# Patient Record
Sex: Female | Born: 1955 | Race: White | Hispanic: No | Marital: Married | Smoking: Former smoker
Health system: Southern US, Community
[De-identification: ages and names within clinical notes are randomized; demographics above are authoritative.]

## PROBLEM LIST (undated history)

## (undated) DIAGNOSIS — F32A Depression, unspecified: Secondary | ICD-10-CM

## (undated) DIAGNOSIS — E039 Hypothyroidism, unspecified: Secondary | ICD-10-CM

## (undated) DIAGNOSIS — R011 Cardiac murmur, unspecified: Secondary | ICD-10-CM

## (undated) DIAGNOSIS — I2510 Atherosclerotic heart disease of native coronary artery without angina pectoris: Secondary | ICD-10-CM

## (undated) DIAGNOSIS — R7303 Prediabetes: Secondary | ICD-10-CM

## (undated) DIAGNOSIS — E785 Hyperlipidemia, unspecified: Secondary | ICD-10-CM

## (undated) DIAGNOSIS — T7840XA Allergy, unspecified, initial encounter: Secondary | ICD-10-CM

## (undated) DIAGNOSIS — F329 Major depressive disorder, single episode, unspecified: Secondary | ICD-10-CM

## (undated) DIAGNOSIS — Z8701 Personal history of pneumonia (recurrent): Secondary | ICD-10-CM

## (undated) DIAGNOSIS — K589 Irritable bowel syndrome without diarrhea: Secondary | ICD-10-CM

## (undated) DIAGNOSIS — G43909 Migraine, unspecified, not intractable, without status migrainosus: Secondary | ICD-10-CM

## (undated) DIAGNOSIS — E119 Type 2 diabetes mellitus without complications: Secondary | ICD-10-CM

## (undated) DIAGNOSIS — R0902 Hypoxemia: Secondary | ICD-10-CM

## (undated) DIAGNOSIS — Z973 Presence of spectacles and contact lenses: Secondary | ICD-10-CM

## (undated) DIAGNOSIS — G4730 Sleep apnea, unspecified: Secondary | ICD-10-CM

## (undated) DIAGNOSIS — M1990 Unspecified osteoarthritis, unspecified site: Secondary | ICD-10-CM

## (undated) DIAGNOSIS — I514 Myocarditis, unspecified: Secondary | ICD-10-CM

## (undated) DIAGNOSIS — Z87442 Personal history of urinary calculi: Secondary | ICD-10-CM

## (undated) DIAGNOSIS — Z5189 Encounter for other specified aftercare: Secondary | ICD-10-CM

## (undated) DIAGNOSIS — F411 Generalized anxiety disorder: Secondary | ICD-10-CM

## (undated) DIAGNOSIS — R0600 Dyspnea, unspecified: Secondary | ICD-10-CM

## (undated) HISTORY — PX: CYST EXCISION: SHX5701

## (undated) HISTORY — DX: Type 2 diabetes mellitus without complications: E11.9

## (undated) HISTORY — DX: Encounter for other specified aftercare: Z51.89

## (undated) HISTORY — PX: ABDOMINAL HYSTERECTOMY: SHX81

## (undated) HISTORY — DX: Hyperlipidemia, unspecified: E78.5

## (undated) HISTORY — DX: Hypothyroidism, unspecified: E03.9

## (undated) HISTORY — DX: Depression, unspecified: F32.A

## (undated) HISTORY — DX: Hypoxemia: R09.02

## (undated) HISTORY — DX: Major depressive disorder, single episode, unspecified: F32.9

## (undated) HISTORY — DX: Migraine, unspecified, not intractable, without status migrainosus: G43.909

## (undated) HISTORY — DX: Sleep apnea, unspecified: G47.30

## (undated) HISTORY — PX: POLYPECTOMY: SHX149

## (undated) HISTORY — DX: Myocarditis, unspecified: I51.4

## (undated) HISTORY — DX: Irritable bowel syndrome without diarrhea: K58.9

## (undated) HISTORY — DX: Unspecified osteoarthritis, unspecified site: M19.90

## (undated) HISTORY — DX: Allergy, unspecified, initial encounter: T78.40XA

## (undated) HISTORY — DX: Generalized anxiety disorder: F41.1

## (undated) HISTORY — DX: Prediabetes: R73.03

## (undated) HISTORY — DX: Personal history of pneumonia (recurrent): Z87.01

## (undated) HISTORY — DX: Cardiac murmur, unspecified: R01.1

---
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
## 1986-06-19 HISTORY — PX: BREAST SURGERY: SHX581

## 1986-06-19 HISTORY — PX: BREAST EXCISIONAL BIOPSY: SUR124

## 1998-06-19 HISTORY — PX: TOTAL ABDOMINAL HYSTERECTOMY W/ BILATERAL SALPINGOOPHORECTOMY: SHX83

## 2000-06-19 HISTORY — PX: CHOLECYSTECTOMY: SHX55

## 2008-09-08 ENCOUNTER — Ambulatory Visit: Payer: Self-pay | Admitting: Family Medicine

## 2008-09-08 DIAGNOSIS — E119 Type 2 diabetes mellitus without complications: Secondary | ICD-10-CM | POA: Insufficient documentation

## 2008-09-08 DIAGNOSIS — R197 Diarrhea, unspecified: Secondary | ICD-10-CM | POA: Insufficient documentation

## 2008-09-08 DIAGNOSIS — F3342 Major depressive disorder, recurrent, in full remission: Secondary | ICD-10-CM | POA: Insufficient documentation

## 2008-09-08 DIAGNOSIS — E039 Hypothyroidism, unspecified: Secondary | ICD-10-CM | POA: Insufficient documentation

## 2008-09-08 DIAGNOSIS — R519 Headache, unspecified: Secondary | ICD-10-CM | POA: Insufficient documentation

## 2008-09-08 DIAGNOSIS — R51 Headache: Secondary | ICD-10-CM | POA: Insufficient documentation

## 2008-09-08 DIAGNOSIS — J45909 Unspecified asthma, uncomplicated: Secondary | ICD-10-CM | POA: Insufficient documentation

## 2008-09-08 HISTORY — DX: Major depressive disorder, recurrent, in full remission: F33.42

## 2008-09-09 ENCOUNTER — Ambulatory Visit: Payer: Self-pay | Admitting: Family Medicine

## 2008-09-09 DIAGNOSIS — R5381 Other malaise: Secondary | ICD-10-CM | POA: Insufficient documentation

## 2008-09-09 DIAGNOSIS — R5383 Other fatigue: Secondary | ICD-10-CM

## 2008-09-13 LAB — CONVERTED CEMR LAB
ALT: 31 units/L (ref 0–35)
AST: 25 units/L (ref 0–37)
BUN: 14 mg/dL (ref 6–23)
Bilirubin, Direct: 0 mg/dL (ref 0.0–0.3)
CO2: 28 meq/L (ref 19–32)
Chloride: 111 meq/L (ref 96–112)
Cholesterol: 210 mg/dL — ABNORMAL HIGH (ref 0–200)
Creatinine, Ser: 0.7 mg/dL (ref 0.4–1.2)
Eosinophils Absolute: 0.1 10*3/uL (ref 0.0–0.7)
Free T4: 0.8 ng/dL (ref 0.6–1.6)
Lymphocytes Relative: 35.1 % (ref 12.0–46.0)
MCHC: 34.2 g/dL (ref 30.0–36.0)
MCV: 81.1 fL (ref 78.0–100.0)
Monocytes Absolute: 0.4 10*3/uL (ref 0.1–1.0)
Neutrophils Relative %: 56.1 % (ref 43.0–77.0)
Platelets: 227 10*3/uL (ref 150.0–400.0)
Potassium: 4 meq/L (ref 3.5–5.1)
RBC: 5.02 M/uL (ref 3.87–5.11)
T4, Total: 6.7 ug/dL (ref 5.0–12.5)
TSH: 2 microintl units/mL (ref 0.35–5.50)
Total Bilirubin: 0.6 mg/dL (ref 0.3–1.2)
Total Protein: 7 g/dL (ref 6.0–8.3)
VLDL: 90.6 mg/dL — ABNORMAL HIGH (ref 0.0–40.0)
WBC: 6.4 10*3/uL (ref 4.5–10.5)

## 2008-12-04 ENCOUNTER — Encounter (INDEPENDENT_AMBULATORY_CARE_PROVIDER_SITE_OTHER): Payer: Self-pay | Admitting: *Deleted

## 2009-04-23 ENCOUNTER — Telehealth: Payer: Self-pay | Admitting: Family Medicine

## 2009-05-24 ENCOUNTER — Ambulatory Visit: Payer: Self-pay | Admitting: Family Medicine

## 2009-07-21 ENCOUNTER — Telehealth: Payer: Self-pay | Admitting: Family Medicine

## 2009-07-21 DIAGNOSIS — E781 Pure hyperglyceridemia: Secondary | ICD-10-CM | POA: Insufficient documentation

## 2009-09-27 ENCOUNTER — Telehealth: Payer: Self-pay | Admitting: Family Medicine

## 2009-11-08 ENCOUNTER — Telehealth: Payer: Self-pay | Admitting: Family Medicine

## 2009-11-17 ENCOUNTER — Ambulatory Visit: Payer: Self-pay | Admitting: Family Medicine

## 2009-11-17 DIAGNOSIS — J029 Acute pharyngitis, unspecified: Secondary | ICD-10-CM | POA: Insufficient documentation

## 2009-11-18 ENCOUNTER — Telehealth: Payer: Self-pay | Admitting: Family Medicine

## 2009-11-18 ENCOUNTER — Encounter: Payer: Self-pay | Admitting: Family Medicine

## 2009-11-19 ENCOUNTER — Ambulatory Visit: Payer: Self-pay | Admitting: Family Medicine

## 2009-11-19 DIAGNOSIS — H6690 Otitis media, unspecified, unspecified ear: Secondary | ICD-10-CM | POA: Insufficient documentation

## 2009-12-09 ENCOUNTER — Encounter (INDEPENDENT_AMBULATORY_CARE_PROVIDER_SITE_OTHER): Payer: Self-pay | Admitting: *Deleted

## 2010-01-14 ENCOUNTER — Ambulatory Visit: Payer: Self-pay | Admitting: Family Medicine

## 2010-01-14 DIAGNOSIS — L03319 Cellulitis of trunk, unspecified: Secondary | ICD-10-CM

## 2010-01-14 DIAGNOSIS — L02219 Cutaneous abscess of trunk, unspecified: Secondary | ICD-10-CM | POA: Insufficient documentation

## 2010-01-15 ENCOUNTER — Encounter: Payer: Self-pay | Admitting: Family Medicine

## 2010-01-17 ENCOUNTER — Ambulatory Visit: Payer: Self-pay | Admitting: Cardiovascular Disease

## 2010-01-17 ENCOUNTER — Inpatient Hospital Stay: Payer: Self-pay | Admitting: Internal Medicine

## 2010-01-17 HISTORY — PX: CARDIAC CATHETERIZATION: SHX172

## 2010-01-17 IMAGING — CR DG CHEST 2V
1 series · 2 of 2 positions shown · non-contrast
Comparison: none

REASON FOR EXAM: fever
COMMENTS:

[Series 1: view not recorded · 0.17mm/px · 2 of 2 slices shown]
[im 1/2]
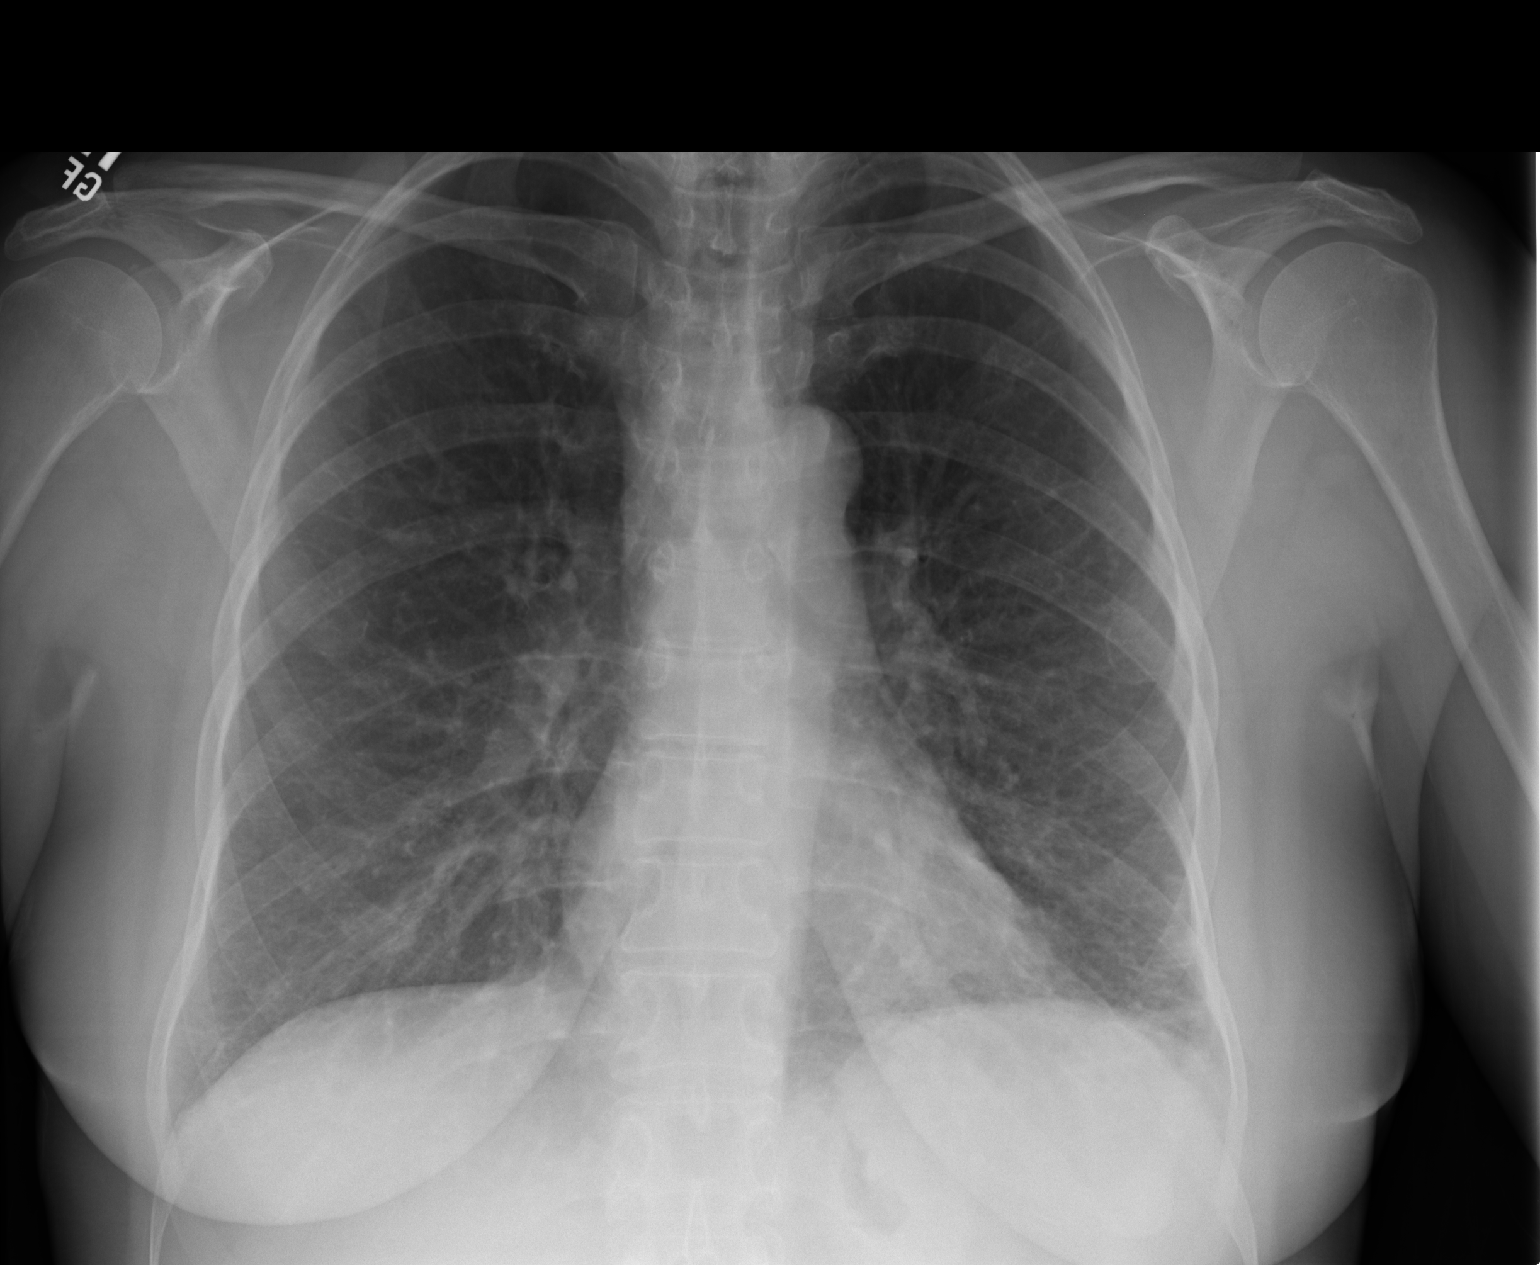
[im 2/2]
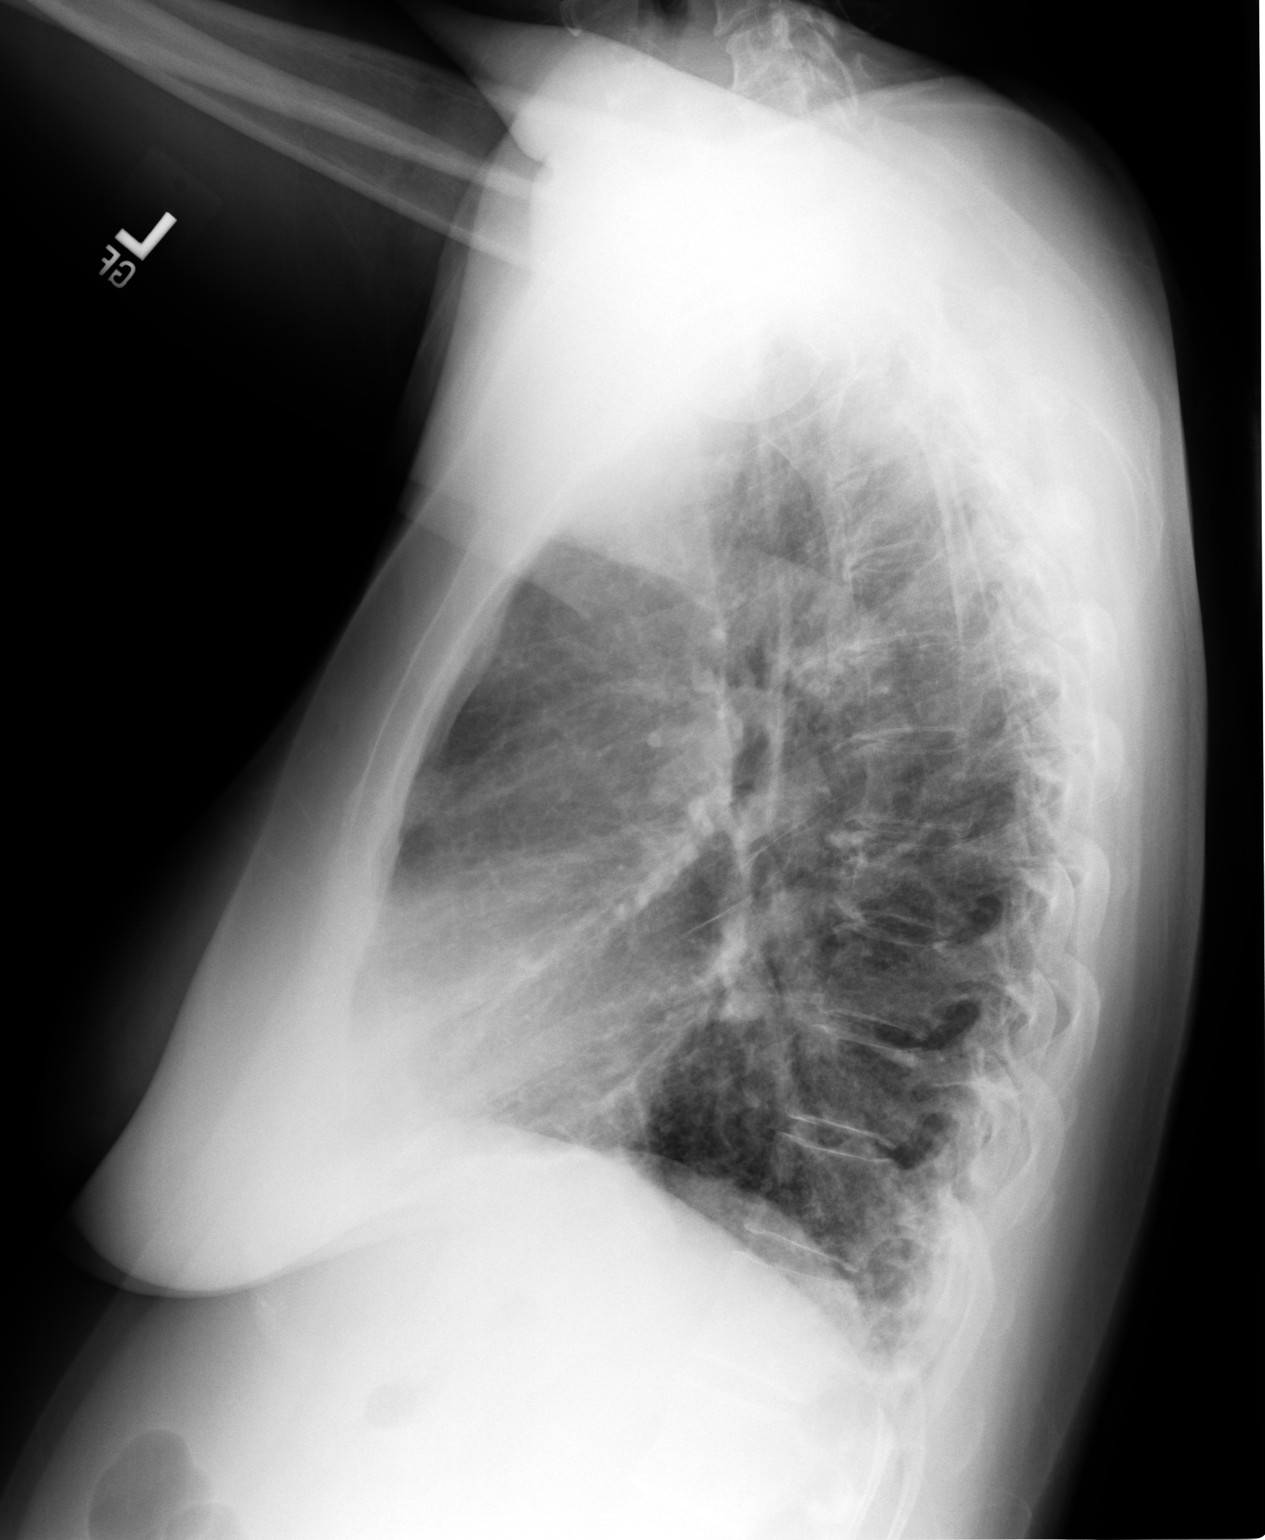

[2 of 2 positions shown; findings below may reference images not displayed]

PROCEDURE:     DXR - DXR CHEST PA (OR AP) AND LATERAL  - [DATE]  [DATE]

RESULT:     There is minimal discoid atelectasis at the left costophrenic
angle. The lung fields otherwise are clear. No definite pneumonia is seen.
No pneumothorax or pleural effusion is noted. Heart size is normal. The
mediastinal and osseous structures show no significant abnormalities.
IMPRESSION: 1.     There is minimal atelectasis at the left base. The lung fields
otherwise are clear.

## 2010-01-18 ENCOUNTER — Telehealth: Payer: Self-pay | Admitting: Family Medicine

## 2010-01-19 ENCOUNTER — Encounter: Payer: Self-pay | Admitting: Cardiovascular Disease

## 2010-01-19 IMAGING — CR DG CHEST 1V PORT
1 series · 1 of 1 positions shown · non-contrast
Comparison: none

REASON FOR EXAM: shortness of breath
COMMENTS:

PROCEDURE:     DXR - DXR PORTABLE CHEST SINGLE VIEW  - [DATE]  [DATE]
RESULT:     Comparison is made to the prior exam of [DATE]. The lung
fields are clear. The heart, mediastinal and osseous structures show no
significant abnormalities. Monitoring electrodes are present.

[view not recorded]
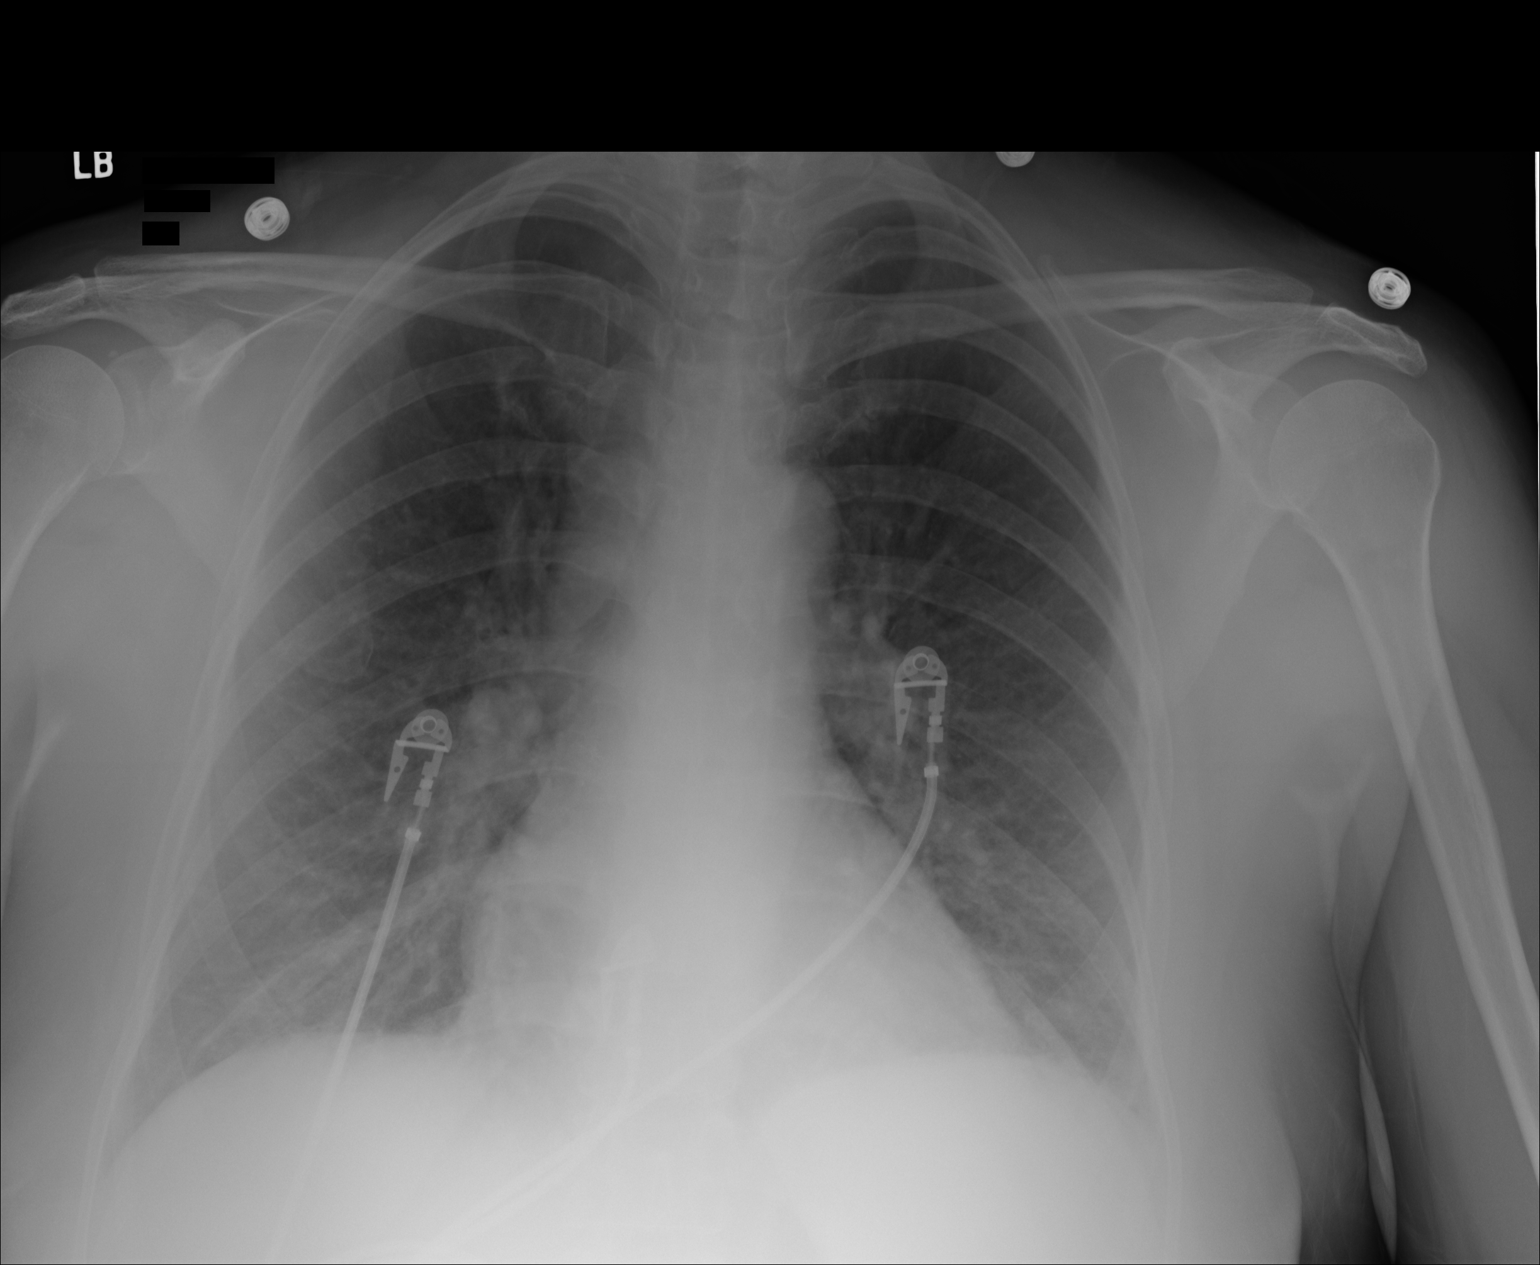

[1 of 1 positions shown; findings below may reference images not displayed]

IMPRESSION: 1.     No acute changes are identified.

## 2010-01-20 ENCOUNTER — Encounter: Payer: Self-pay | Admitting: Cardiovascular Disease

## 2010-01-21 ENCOUNTER — Encounter: Payer: Self-pay | Admitting: Cardiovascular Disease

## 2010-01-21 DIAGNOSIS — I514 Myocarditis, unspecified: Secondary | ICD-10-CM

## 2010-01-21 HISTORY — DX: Myocarditis, unspecified: I51.4

## 2010-01-27 ENCOUNTER — Ambulatory Visit: Payer: Self-pay | Admitting: Family Medicine

## 2010-01-27 DIAGNOSIS — B3322 Viral myocarditis: Secondary | ICD-10-CM | POA: Insufficient documentation

## 2010-02-08 ENCOUNTER — Ambulatory Visit: Payer: Self-pay | Admitting: Cardiovascular Disease

## 2010-02-08 DIAGNOSIS — R0602 Shortness of breath: Secondary | ICD-10-CM | POA: Insufficient documentation

## 2010-03-18 ENCOUNTER — Observation Stay (HOSPITAL_COMMUNITY)
Admission: EM | Admit: 2010-03-18 | Discharge: 2010-03-20 | Payer: Self-pay | Source: Home / Self Care | Admitting: Emergency Medicine

## 2010-03-18 ENCOUNTER — Encounter: Payer: Self-pay | Admitting: Cardiovascular Disease

## 2010-03-18 ENCOUNTER — Ambulatory Visit: Payer: Self-pay | Admitting: Cardiovascular Disease

## 2010-03-18 ENCOUNTER — Ambulatory Visit: Payer: Self-pay | Admitting: Internal Medicine

## 2010-03-18 IMAGING — CR DG CHEST 1V PORT
1 series · 1 of 1 positions shown · non-contrast
Comparison: None

CLINICAL DATA: Chest pain

PORTABLE CHEST - 1 VIEW

[view not recorded]
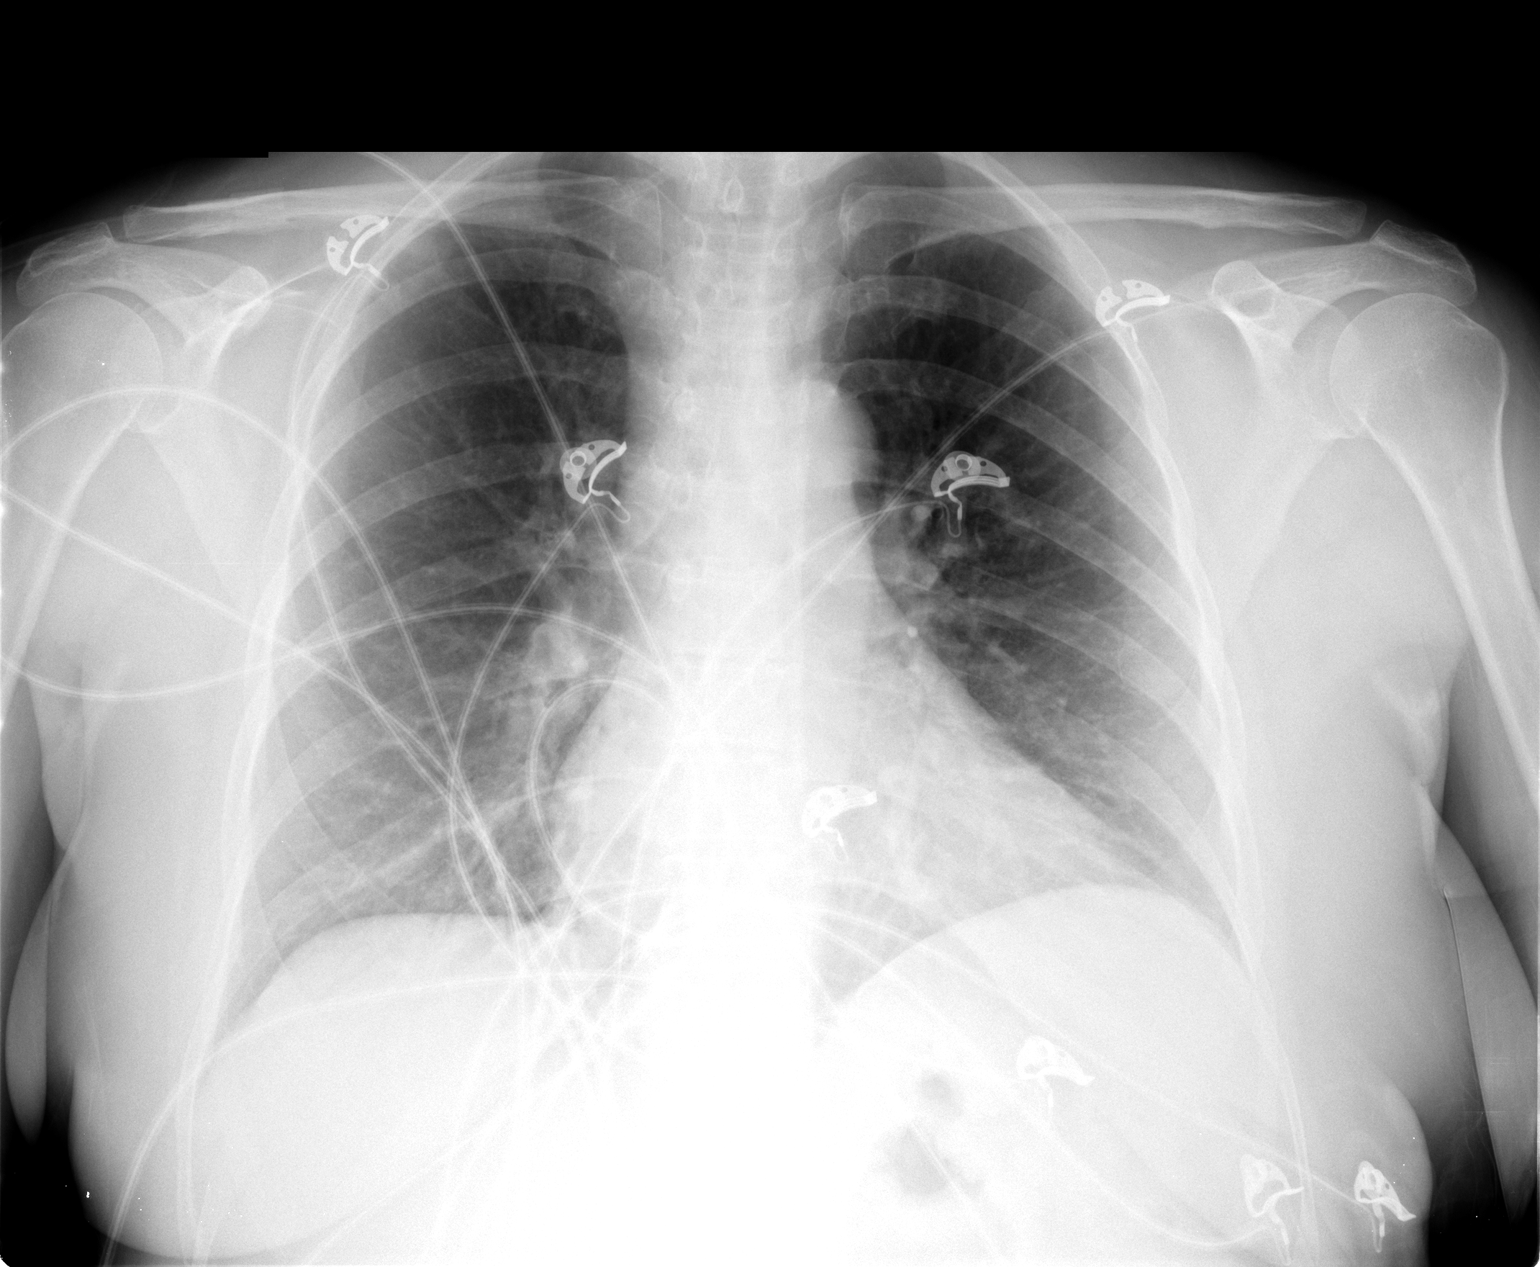

[1 of 1 positions shown; findings below may reference images not displayed]

FINDINGS: Heart and mediastinal contours are within normal limits.
No focal opacities or effusions.  No acute bony abnormality.
IMPRESSION: No active disease.

## 2010-03-19 ENCOUNTER — Encounter: Payer: Self-pay | Admitting: Cardiology

## 2010-03-24 ENCOUNTER — Telehealth (INDEPENDENT_AMBULATORY_CARE_PROVIDER_SITE_OTHER): Payer: Self-pay | Admitting: *Deleted

## 2010-04-01 ENCOUNTER — Ambulatory Visit: Payer: Self-pay | Admitting: Cardiovascular Disease

## 2010-04-01 DIAGNOSIS — E782 Mixed hyperlipidemia: Secondary | ICD-10-CM | POA: Insufficient documentation

## 2010-04-05 ENCOUNTER — Telehealth (INDEPENDENT_AMBULATORY_CARE_PROVIDER_SITE_OTHER): Payer: Self-pay | Admitting: *Deleted

## 2010-05-11 ENCOUNTER — Ambulatory Visit: Payer: Self-pay | Admitting: Family Medicine

## 2010-06-01 ENCOUNTER — Ambulatory Visit: Payer: Self-pay | Admitting: Family Medicine

## 2010-06-01 ENCOUNTER — Encounter (INDEPENDENT_AMBULATORY_CARE_PROVIDER_SITE_OTHER): Payer: Self-pay | Admitting: *Deleted

## 2010-06-01 DIAGNOSIS — R209 Unspecified disturbances of skin sensation: Secondary | ICD-10-CM | POA: Insufficient documentation

## 2010-06-07 ENCOUNTER — Telehealth: Payer: Self-pay | Admitting: Family Medicine

## 2010-06-08 ENCOUNTER — Ambulatory Visit: Payer: Self-pay | Admitting: Family Medicine

## 2010-06-08 DIAGNOSIS — K5289 Other specified noninfective gastroenteritis and colitis: Secondary | ICD-10-CM | POA: Insufficient documentation

## 2010-07-19 NOTE — Cardiovascular Report (Signed)
Summary: Cardiac Cath Other  Cardiac Cath Other   Imported By: [place] 01/20/2010 14:40:00  _____________________________________________________________________  External Attachment:    Type:   Image     Comment:   External Document

## 2010-07-19 NOTE — Progress Notes (Signed)
Summary: Topiramate  Phone Note Refill Request Message from:  Fax from Pharmacy on Nov 08, 2009 11:21 AM  Refills Requested: Medication #1:  TOPAMAX 50 MG TABS take one two times a day [facility]  Phone:   [phone]   Method Requested: Electronic Initial call taken by: Delilah Shan CMA (AAMA),  Nov 08, 2009 11:22 AM    Prescriptions: TOPAMAX 50 MG TABS (TOPIRAMATE) take one two times a day  #60 x 11   Entered and Authorized by:   Hannah Beat MD   Signed by:   Hannah Beat MD on 11/08/2009   Method used:   Electron[facility] to        [id number]       [address]       [place], [place]  [address]       Ph: [phone]       Fax: [phone]   RxID:   [id number]

## 2010-07-19 NOTE — Letter (Signed)
[facility]   Imported By: Harlon Flor 01/25/2010 09:15:49  _____________________________________________________________________  External Attachment:    Type:   Image     Comment:   External Document

## 2010-07-19 NOTE — Cardiovascular Report (Signed)
[facility]   Imported By: Harlon Flor 02/09/2010 16:25:06  _____________________________________________________________________  External Attachment:    Type:   Image     Comment:   External Document

## 2010-07-19 NOTE — Progress Notes (Signed)
Summary: CALLED PT  Phone Note Outgoing Call Call back at Home Phone [phone]   Call placed by: Harlon Flor,  April 05, 2010 9:09 AM Call placed to: Patient Summary of Call: [facility] to schedule fasting labs Initial call taken by: Harlon Flor,  April 05, 2010 9:09 AM

## 2010-07-19 NOTE — Progress Notes (Signed)
  Pt signed ROI,Mailed records out to day to pt [facility]  March 24, 2010 9:14 AM

## 2010-07-19 NOTE — Assessment & Plan Note (Signed)
Summary: NURSE/AMD  Nurse Visit   Vital Signs:  Patient profile:   55 year old female Height:      64 inches Pulse rate:   74 / minute BP supine:   132 / 78  (left arm) Cuff size:   regular Comments Pt arrived at office c/o chest pain. Pt has been having chest pain on and office since last night it was relieved by nitro but pt has not take nitro today. Pt describes chest pain as 4/10 radiating into her left arm and jaw. Spoke to Dr. Gala Romney who advised that the pt be transported to ER, pt chose to go to [facility]. Will be transported via [employer].  Pt left via EMS Benedict Needy, RN  March 18, 2010 12:44 PM    Allergies: 1)  ! * Nyquil 2)  ! Penicillin

## 2010-07-19 NOTE — Miscellaneous (Signed)
Summary: med list update- pravachol  Clinical Lists Changes  Medications: Changed medication from PRAVACHOL 10 MG TABS (PRAVASTATIN SODIUM) take one by mouth daily to PRAVACHOL 40 MG TABS (PRAVASTATIN SODIUM) take one by mouth daily     Prior Medications: LOVAZA 1 GM CAPS (OMEGA-3-ACID ETHYL ESTERS) take two caps two times a day TOPAMAX 50 MG TABS (TOPIRAMATE) take one two times a day ZOLOFT 50 MG TABS (SERTRALINE HCL) take one by mouth daily PRAVACHOL 40 MG TABS (PRAVASTATIN SODIUM) take one by mouth daily LEVOTHYROXINE SODIUM 50 MCG TABS (LEVOTHYROXINE SODIUM) take one by mouth daily METFORMIN HCL 500 MG TABS (METFORMIN HCL) take one by mouth daily ACCU-CHEK AVIVA  STRP (GLUCOSE BLOOD) Use as directed. TRICOR 145 MG TABS (FENOFIBRATE) take one by mouth daily CHERATUSSIN AC 100-10 MG/5ML SYRP (GUAIFENESIN-CODEINE) 5 ml by mouth at bedtime as needed cough Current Allergies: ! * NYQUIL ! PENICILLIN

## 2010-07-19 NOTE — Assessment & Plan Note (Signed)
Summary: sore throat, fever, laryngitis 3:30   Vital Signs:  Patient profile:   55 year old female Height:      64 inches Weight:      159.38 pounds BMI:     27.46 Temp:     98.8 degrees F oral Pulse rate:   64 / minute Pulse rhythm:   regular BP sitting:   122 / 80  (left arm) Cuff size:   regular  Vitals Entered By: Linde Gillis CMA Duncan Dull) (November 17, 2009 3:31 PM) CC: fever, cough, sore throat   History of Present Illness: 55 yo here for 2 day of subjective fever, cough, sore throat. cough is mainly dry. No n/v/d. No headache. No rash. Ears are popping. Cough keeping her up at night.  No known sick contacts but works around a lot of people at [employer].  Current Medications (verified): 1)  Lovaza 1 Gm Caps (Omega-3-Acid Ethyl Esters) .... Take Two Caps Two Times A Day 2)  Topamax 50 Mg Tabs (Topiramate) .... Take One Two Times A Day 3)  Zoloft 50 Mg Tabs (Sertraline Hcl) .... Take One By Mouth Daily 4)  Pravachol 10 Mg Tabs (Pravastatin Sodium) .... Take One By Mouth Daily 5)  Levothyroxine Sodium 50 Mcg Tabs (Levothyroxine Sodium) .... Take One By Mouth Daily 6)  Metformin Hcl 500 Mg Tabs (Metformin Hcl) .... Take One By Mouth Daily 7)  Accu-Chek Aviva  Strp (Glucose Blood) .... Use As Directed. 8)  Tricor 145 Mg Tabs (Fenofibrate) .... Take One By Mouth Daily 9)  Cheratussin Ac 100-10 Mg/71ml Syrp (Guaifenesin-Codeine) .... 5 Ml By Mouth At Bedtime As Needed Cough  Allergies: 1)  ! * Nyquil 2)  ! Penicillin  Review of Systems      See HPI General:  Complains of chills and fever. ENT:  Complains of nasal congestion, postnasal drainage, sinus pressure, and sore throat; denies ear discharge. CV:  Denies chest pain or discomfort. Resp:  Complains of cough; denies shortness of breath, sputum productive, and wheezing. GI:  Denies abdominal pain, nausea, and vomiting.  Physical Exam  General:  Well-developed,well-nourished,in no acute distress; alert,appropriate and  cooperative throughout examination Ears:  TMs retracted Nose:  mucosal erythema.   Mouth:  pharyngeal erythema.   no exudates Lungs:  Normal respiratory effort, chest expands symmetrically. Lungs are clear to auscultation, no crackles or wheezes. Heart:  Normal rate and regular rhythm. S1 and S2 normal without gallop, murmur, click, rub or other extra sounds. Extremities:  No clubbing, cyanosis, edema, or deformity noted with normal full range of motion of all joints.   Psych:  Cognition and judgment appear intact. Alert and cooperative with normal attention span and concentration. No apparent delusions, illusions, hallucinations   Impression & Recommendations:  Problem # 1:  URI (ICD-465.9) Assessment New Likely viral, encouraged supportive care. Rapid strep negative and history and physical exam not consistent with strep pharyngitis. See pt instructions for details. Her updated medication list for this problem includes:    Cheratussin Ac 100-10 Mg/58ml Syrp (Guaifenesin-codeine) .Marland KitchenMarland KitchenMarland KitchenMarland Kitchen 5 ml by mouth at bedtime as needed cough  Complete Medication List: 1)  Lovaza 1 Gm Caps (Omega-3-acid ethyl esters) .... Take two caps two times a day 2)  Topamax 50 Mg Tabs (Topiramate) .... Take one two times a day 3)  Zoloft 50 Mg Tabs (Sertraline hcl) .... Take one by mouth daily 4)  Pravachol 10 Mg Tabs (Pravastatin sodium) .... Take one by mouth daily 5)  Levothyroxine Sodium 50  Mcg Tabs (Levothyroxine sodium) .... Take one by mouth daily 6)  Metformin Hcl 500 Mg Tabs (Metformin hcl) .... Take one by mouth daily 7)  Accu-chek Aviva Strp (Glucose blood) .... Use as directed. 8)  Tricor 145 Mg Tabs (Fenofibrate) .... Take one by mouth daily 9)  Cheratussin Ac 100-10 Mg/65ml Syrp (Guaifenesin-codeine) .... 5 ml by mouth at bedtime as needed cough  Other Orders: Rapid Strep ([address])  Patient Instructions: 1)  Get plenty of rest, drink lots of clear liquids, and use Tylenol or Ibuprofen for fever  and comfort. Return in 7-10 days if you're not better: sooner if you'er feeling worse.  Prescriptions: CHERATUSSIN AC 100-10 MG/5ML SYRP (GUAIFENESIN-CODEINE) 5 ml by mouth at bedtime as needed cough  #4 ounces x 0   Entered and Authorized by:   Ruthe Mannan MD   Signed by:   Ruthe Mannan MD on 11/17/2009   Method used:   Print then Give to Patient   RxID:   [id number]   Current Allergies (reviewed today): ! * NYQUIL ! PENICILLIN

## 2010-07-19 NOTE — Progress Notes (Signed)
Summary: Prior Authorization for Lovaza  Phone Note Outgoing Call Call back at [phone]   Call placed by: Linde Gillis CMA Duncan Dull),  July 21, 2009 9:53 AM Call placed to: [employer] Summary of Call: Received [facility] [phone] stating that prior authorization is needed for Lovaza.  [employer] and spoke to [place] D.  She is faxing paper work to our office today. Initial call taken by: Linde Gillis CMA Duncan Dull),  July 21, 2009 9:54 AM  Follow-up for Phone Call        Form filled out by Dr. Patsy Lager, faxed form to [facility] [phone].  Follow-up by: Linde Gillis CMA Duncan Dull),  July 26, 2009 4:09 PM  Additional Follow-up for Phone Call Additional follow up Details #1::        Received approval for Lovaza.  Approved from 07/05/2009 through 07/26/2010, patient and pharmacy notified. Additional Follow-up by: Linde Gillis CMA Duncan Dull),  August 05, 2009 11:21 AM  New Problems: HYPERTRIGLYCERIDEMIA (ICD-272.1)   New Problems: HYPERTRIGLYCERIDEMIA (ICD-272.1)

## 2010-07-19 NOTE — Letter (Signed)
Summary: Out of Work  [employer] at [facility]  [address] [place], [place] [address]   Phone: [phone]  Fax: [phone]    November 18, 2009   Employee:  PRISHA HILEY    To Whom It May Concern:   For Medical reasons, please excuse the above named employee from work for the following dates:  Start:   11/18/2009  End:   11/22/2009  If you need additional information, please feel free to contact our office.         Sincerely,    Ruthe Mannan MD

## 2010-07-19 NOTE — Letter (Signed)
Summary: PHI  PHI   Imported By: Harlon Flor 02/09/2010 16:25:58  _____________________________________________________________________  External Attachment:    Type:   Image     Comment:   External Document

## 2010-07-19 NOTE — Letter (Signed)
Summary: Work Writer at [employer]. [address]   [place], [place] [address]   Phone: [phone]  Fax: [phone]     March 18, 2010    Brandi Mahoney   The above named patient had a medical visit today at:    1  am  and transported to [facility] Emergency Room.   Please take this into consideration when reviewing the time away from work/school.      Sincerely yours,  Architectural technologist

## 2010-07-19 NOTE — Assessment & Plan Note (Signed)
Summary: 3:15 not feeling any better ear pain both ears/rbh   Vital Signs:  Patient profile:   55 year old female Height:      64 inches Weight:      157 pounds BMI:     27.05 Temp:     102.4 degrees F oral Pulse rate:   88 / minute Pulse rhythm:   regular BP sitting:   130 / 84  (left arm) Cuff size:   regular  Vitals Entered By: Linde Gillis CMA Duncan Dull) (November 19, 2009 3:25 PM) CC: not feeling any better, ear pain   History of Present Illness: 55 yo here for worsening left ear pain.  Was seen 2 days ago with subjective fever, cough, sore throat. Now has a fever of 102.4, no cough or other symptoms but left ear is excruciatingly painful. No drainage.  No known sick contacts but works around a lot of people at [employer].  Current Medications (verified): 1)  Lovaza 1 Gm Caps (Omega-3-Acid Ethyl Esters) .... Take Two Caps Two Times A Day 2)  Topamax 50 Mg Tabs (Topiramate) .... Take One Two Times A Day 3)  Zoloft 50 Mg Tabs (Sertraline Hcl) .... Take One By Mouth Daily 4)  Pravachol 10 Mg Tabs (Pravastatin Sodium) .... Take One By Mouth Daily 5)  Levothyroxine Sodium 50 Mcg Tabs (Levothyroxine Sodium) .... Take One By Mouth Daily 6)  Metformin Hcl 500 Mg Tabs (Metformin Hcl) .... Take One By Mouth Daily 7)  Accu-Chek Aviva  Strp (Glucose Blood) .... Use As Directed. 8)  Tricor 145 Mg Tabs (Fenofibrate) .... Take One By Mouth Daily 9)  Cheratussin Ac 100-10 Mg/37ml Syrp (Guaifenesin-Codeine) .... 5 Ml By Mouth At Bedtime As Needed Cough 10)  Azithromycin 250 Mg  Tabs (Azithromycin) .... 2 By  Mouth Today and Then 1 Daily For 4 Days  Allergies: 1)  ! * Nyquil 2)  ! Penicillin  Review of Systems      See HPI General:  Complains of fever. ENT:  Complains of earache and postnasal drainage; denies ear discharge.  Physical Exam  General:  Well-developed,well-nourished,in no acute distress; alert,appropriate and cooperative throughout examination Ears:  No left TM is buldging,  erythematous canal Right TM normal Mouth:  pharyngeal erythema.   no exudates Lungs:  Normal respiratory effort, chest expands symmetrically. Lungs are clear to auscultation, no crackles or wheezes. Heart:  Normal rate and regular rhythm. S1 and S2 normal without gallop, murmur, click, rub or other extra sounds. Extremities:  No clubbing, cyanosis, edema, or deformity noted with normal full range of motion of all joints.     Impression & Recommendations:  Problem # 1:  OTITIS MEDIA, ACUTE, LEFT (ICD-382.9) Assessment New Allergic to PCN. Treat with Zpack, supportive care with Ibuprofen and Cheratussin. Her updated medication list for this problem includes:    Azithromycin 250 Mg Tabs (Azithromycin) .Marland Kitchen... 2 by  mouth today and then 1 daily for 4 days  Complete Medication List: 1)  Lovaza 1 Gm Caps (Omega-3-acid ethyl esters) .... Take two caps two times a day 2)  Topamax 50 Mg Tabs (Topiramate) .... Take one two times a day 3)  Zoloft 50 Mg Tabs (Sertraline hcl) .... Take one by mouth daily 4)  Pravachol 10 Mg Tabs (Pravastatin sodium) .... Take one by mouth daily 5)  Levothyroxine Sodium 50 Mcg Tabs (Levothyroxine sodium) .... Take one by mouth daily 6)  Metformin Hcl 500 Mg Tabs (Metformin hcl) .... Take one by mouth daily  7)  Accu-chek Aviva Strp (Glucose blood) .... Use as directed. 8)  Tricor 145 Mg Tabs (Fenofibrate) .... Take one by mouth daily 9)  Cheratussin Ac 100-10 Mg/15ml Syrp (Guaifenesin-codeine) .... 5 ml by mouth at bedtime as needed cough 10)  Azithromycin 250 Mg Tabs (Azithromycin) .... 2 by  mouth today and then 1 daily for 4 days Prescriptions: AZITHROMYCIN 250 MG  TABS (AZITHROMYCIN) 2 by  mouth today and then 1 daily for 4 days  #6 x 0   Entered and Authorized by:   Ruthe Mannan MD   Signed by:   Ruthe Mannan MD on 11/19/2009   Method used:   [facility]        [id number]       [address]       [place], [place]  [address]        Ph: [phone]       Fax: [phone]   RxID:   [id number]   Current Allergies (reviewed today): ! * NYQUIL ! PENICILLIN

## 2010-07-19 NOTE — Progress Notes (Signed)
Summary: call a nurse   Phone Note Other Incoming   Caller: Fax from call a nurse - Albertine Grates Summary of Call: Husband/Tony called concerned that patient had cyst drained on 01-13-10 in the office and has since developed a fever of 104. She has vomited z 1. Mouth is very dry and she feels light headed. he was advised to go to ED.  Initial call taken by: Melody Comas,  January 18, 2010 9:15 AM  Follow-up for Phone Call        Agree.  Please call to find out how pt sding today and ER outcome..? admitted for IV antibiotics. Follow-up by: Kerby Nora MD,  January 18, 2010 9:22 AM  Additional Follow-up for Phone Call Additional follow up Details #1::        called patient on home and cell number got know answer will try again later.Consuello Masse CMA  Pt admitted to [facility], fever has broken, doctors at hospital dont think this is related to the cyst that she had drained.   She has had 3 bags of fluids but still feels very weak.  Husband will keep you updated.               Lowella Petties CMA  January 18, 2010 11:55 AM

## 2010-07-19 NOTE — Letter (Signed)
Summary: Out of Work  [employer] at [facility]  [address] [place], [place] [address]   Phone: [phone]  Fax: [phone]    November 17, 2009   Employee:  Brandi Mahoney    To Whom It May Concern:   For Medical reasons, please excuse the above named employee from work for the following dates:  Start:   November 18, 2009  End:   November 19, 2009  If you need additional information, please feel free to contact our office.         Sincerely,    Ruthe Mannan MD

## 2010-07-19 NOTE — Progress Notes (Signed)
Summary: refill request for tricor  Phone Note Refill Request Message from:  Fax from Pharmacy  Refills Requested: Medication #1:  tricor 145 mg   Last Refilled: 08/23/2009 This is not on pt's med list.  Initial call taken by: Lowella Petties CMA,  September 27, 2009 3:48 PM  Follow-up for Phone Call        ok to refill  Tricor 145 mg, 1 by mouth daily Call in to the pharmacy of their choice Call in #30, 11 refills. OR if they prefer a 90 day supply, #90 with 3 refills is OK, too Prescription instructions above   place on med list Follow-up by: Hannah Beat MD,  September 27, 2009 4:35 PM  Additional Follow-up for Phone Call Additional follow up Details #1::        Sent electronically to [facility], added to [employer]. Additional Follow-up by: Lowella Petties CMA,  September 27, 2009 4:40 PM    New/Updated Medications: TRICOR 145 MG TABS (FENOFIBRATE) take one by mouth daily Prescriptions: TRICOR 145 MG TABS (FENOFIBRATE) take one by mouth daily  #30 x 11   Entered by:   Lowella Petties CMA   Authorized by:   Hannah Beat MD   Signed by:   Lowella Petties CMA on 09/27/2009   Method used:   [facility]ron[facility] to        [id number]       [address]       [place], [place]  [address]       Ph: [phone]       Fax: [phone]   RxID:   [id number]   Prior Medications: LOVAZA 1 GM CAPS (OMEGA-3-ACID ETHYL ESTERS) take two caps two times a day TOPAMAX 50 MG TABS (TOPIRAMATE) take one two times a day ZOLOFT 50 MG TABS (SERTRALINE HCL) take one by mouth daily PRAVACHOL 10 MG TABS (PRAVASTATIN SODIUM) take one by mouth daily LEVOTHYROXINE SODIUM 50 MCG TABS (LEVOTHYROXINE SODIUM) take one by mouth daily METFORMIN HCL 500 MG TABS (METFORMIN HCL) take one by mouth daily ERY-TAB 333 MG TBEC (ERYTHROMYCIN BASE) one tab by mouth three times a day ACCU-CHEK AVIVA  STRP (GLUCOSE BLOOD) Use as directed. Current Allergies: ! * NYQUIL ! PENICILLIN

## 2010-07-19 NOTE — Assessment & Plan Note (Signed)
Summary: CYST TOP OF BREAST NEAR ARM/DLO   Vital Signs:  Patient profile:   55 year old female Height:      64 inches Weight:      160.4 pounds BMI:     27.63 Temp:     99.1 degrees F oral Pulse rate:   88 / minute Pulse rhythm:   regular BP sitting:   110 / 70  (left arm) Cuff size:   regular  Vitals Entered By: Benny Lennert CMA Duncan Dull) (January 14, 2010 4:32 PM)  History of Present Illness: Chief complaint cyst on breast near arm  pleasant 55 year old female that I know well who presents with a right upper quadrant breast just anterior to the axilla cystic area that is red in appearance, very painful. She saw one of the local dermatologist recently, and was given some minocycline. She's been on this for about 2 weeks, this is progress and has gotten very painful day.  Allergies: 1)  ! * Nyquil 2)  ! Penicillin    Impression & Recommendations:  Problem # 1:  CELLULITIS AND ABSCESS OF TRUNK (ICD-682.2) Assessment New  incision and drainage, loculated, complex, right anterior chest wall Verbal and written consent obtained. Prepped with alcohol multiple times and then anesthetized with approximately 7 cc of a mixture that is 50% Marcaine 0.5% and 50% lidocaine 1% with epinephrine.Marland Kitchen Opened without difficulty with a #11 scalpel. loculations were opened on multiple attempts with a hemostat clamp good success. Approximately 3 cc of loculated purulent material was expressed and sent for culture. The wound was then packed and dressed appropriately. No complications and no significant bleeding.  Orders: T-Culture, Wound ([id number]) Specimen Handling ([id number]) I&D Abscess, Complex ([id number])  Complete Medication List: 1)  Lovaza 1 Gm Caps (Omega-3-acid ethyl esters) .... Take two caps two times a day 2)  Topamax 50 Mg Tabs (Topiramate) .... Take one two times a day 3)  Zoloft 50 Mg Tabs (Sertraline hcl) .... Take one by mouth daily 4)  Pravachol 40 Mg Tabs (Pravastatin sodium)  .... Take one by mouth daily 5)  Levothyroxine Sodium 50 Mcg Tabs (Levothyroxine sodium) .... Take one by mouth daily 6)  Metformin Hcl 500 Mg Tabs (Metformin hcl) .... Take one by mouth daily 7)  Accu-chek Aviva Strp (Glucose blood) .... Use as directed. 8)  Tricor 145 Mg Tabs (Fenofibrate) .... Take one by mouth daily  Prior Medications: LOVAZA 1 GM CAPS (OMEGA-3-ACID ETHYL ESTERS) take two caps two times a day TOPAMAX 50 MG TABS (TOPIRAMATE) take one two times a day ZOLOFT 50 MG TABS (SERTRALINE HCL) take one by mouth daily PRAVACHOL 40 MG TABS (PRAVASTATIN SODIUM) take one by mouth daily LEVOTHYROXINE SODIUM 50 MCG TABS (LEVOTHYROXINE SODIUM) take one by mouth daily METFORMIN HCL 500 MG TABS (METFORMIN HCL) take one by mouth daily ACCU-CHEK AVIVA  STRP (GLUCOSE BLOOD) Use as directed. TRICOR 145 MG TABS (FENOFIBRATE) take one by mouth daily Current Allergies: ! * NYQUIL ! PENICILLIN

## 2010-07-19 NOTE — Assessment & Plan Note (Signed)
Summary: EPH/AMD   Visit Type:  Initial Consult Primary Provider:  Hannah Beat MD  CC:  F/U  [facility].  c/o shortness of breath..  History of Present Illness: Brandi Mahoney is a very pleasant 55 year old woman, patient of Dr. Dallas Schimke, who was recently seen at [facility] for non-ST elevation MI,  elevation of troponin to 17, with cardiac catheterization showing nonobstructive left circumflex disease, presentation felt to be secondary to a viral myocarditis with low normal systolic function by cath, Who had a second admission to [facility] for chest discomfort, felt to be noncardiac with echocardiogram showing normal systolic function who presents for routine followup.  She reports having left posterior shoulder pain of uncertain etiology. She's not sure if this is from her heart or from musculoskeletal causes. She has not tried to take nitroglycerin or isosorbide. These have been prescribed by the hospital at [facility] and [facility] though she does not have the prescription. She is otherwise active and feels well currently with no chest pain. she does have mild shortness of breath.  Echocardiogram on August 3 shows normal systolic function with ejection fraction at 55%, unable to rule out wall motion abnormalities in the inferior, posterior and septal walls, normal right ventricular systolic pressure, mild mitral regurg.  Cardiac catheterization August 4 showing mild atherosclerosis in the mid left circumflex after the takeoff of the OM1, mild diffuse LAD disease  repeat echocardiogram on March 19, 2000 showing ejection fraction 60-65%, diastolic dysfunction, mild LVH  EKG shows normal sinus rhythm with rate 62 beats minute, no significant ST or T wave changes  Current Medications (verified): 1)  Lovaza 1 Gm Caps (Omega-3-Acid Ethyl Esters) .... Take Two Caps Two Times A Day 2)  Topamax 50 Mg Tabs (Topiramate) .... Take One Two Times A Day 3)  Zoloft 50 Mg Tabs (Sertraline Hcl) .... Take One  By Mouth Daily 4)  Pravachol 40 Mg Tabs (Pravastatin Sodium) .... Take One By Mouth Daily 5)  Levothyroxine Sodium 50 Mcg Tabs (Levothyroxine Sodium) .... Take One By Mouth Daily 6)  Accu-Chek Aviva  Strp (Glucose Blood) .... Use As Directed. 7)  Tricor 145 Mg Tabs (Fenofibrate) .... Take One By Mouth Daily 8)  Aspir-Low 81 Mg Tbec (Aspirin) .... One Tablet Once Daily  Allergies (verified): 1)  ! * Nyquil 2)  ! Penicillin  Past History:  Past Medical History: Last updated: 01/27/2010 Asthma Depression Diabetes mellitus, type II Headache, Migraine IBS Hypothyroidism myocarditis, January 21, 2010  Past Surgical History: Last updated: 01/27/2010 Appendectomy, 1974 Cholecystectomy, 1997 Hysterectomy, 1998  hospitalization: Myocarditis, [facility], 8/5, 2011 Cardiac Cath, 01/2010, [facility] ([facility]), no significant CAD  Family History: Last updated: 09/08/2008 Alcoholism, Drug Addiction: 0 Colon CA: 0 Ovarian/Uterine CA: 0 Breast CA: GM Lung CA: GP Prostate CA: 0 CAD: P CVA: 0 Sudden death < 50: 0 DM: + Mental Illness: 0   ALS  Social History: Last updated: 09/08/2008 Marital Status: Married Children:  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  The patient denies fever, weight loss, weight gain, vision loss, decreased hearing, hoarseness, chest pain, syncope, peripheral edema, prolonged cough, abdominal pain, incontinence, muscle weakness, depression, and enlarged lymph nodes.    Vital Signs:  Patient profile:   55 year old female Height:      64 inches Weight:      160 pounds BMI:  27.56 Pulse rate:   61 / minute BP sitting:   117 / 77  (left arm) Cuff size:   regular  Vitals Entered By: Bishop Dublin, CMA (April 01, 2010 2:55 PM)  Physical Exam  General:  Well developed, well nourished, in no acute distress. Head:   normocephalic and atraumatic Neck:  Neck supple, no JVD. No masses, thyromegaly or abnormal cervical nodes. Lungs:  Clear bilaterally to auscultation and percussion. Heart:  Non-displaced PMI, chest non-tender; regular rate and rhythm, S1, S2 without murmurs, rubs or gallops. Carotid upstroke normal, no bruit.  Pedals normal pulses. No edema, no varicosities. Abdomen:  Bowel sounds positive; abdomen soft and non-tender without masses Msk:  Back normal, normal gait. Muscle strength and tone normal. Pulses:  pulses normal in all 4 extremities Extremities:  No clubbing or cyanosis. Neurologic:  Alert and oriented x 3. Skin:  Intact without lesions or rashes. Psych:  Normal affect.   Impression & Recommendations:  Problem # 1:  SHORTNESS OF BREATH (ICD-786.05) etiology of her shortness of breath is uncertain. She has normal ejection fraction on echocardiogram. I'm concerned this could be secondary to her deconditioned state and I suggested that she increase her exercise.  Her updated medication list for this problem includes:    Aspir-low 81 Mg Tbec (Aspirin) ..... One tablet once daily  Problem # 2:  OTHER ACUTE MYOCARDITIS (ICD-422.99) It is uncertain if her posterior left shoulder pain is secondary to residual myocarditis as this seems to cause her discomfort frequently. She could possibly have vasospasm and I suggested she use her sublingual nitroglycerin. If this helps, we have given her a prescription for isosorbide. She will take a half, 15 mg to start as her blood pressure is borderline low. if she is unable to tolerate this and continues to have symptoms, she could try Ranexa.  Problem # 3:  HYPERLIPIDEMIA-MIXED (ICD-272.4) she does have mild coronary artery disease. We will try to obtain her most recent lipid panel for records from the hospital.  Her updated medication list for this problem includes:    Lovaza 1 Gm Caps (Omega-3-acid ethyl esters) .Marland Kitchen... Take two caps two times a  day    Pravachol 40 Mg Tabs (Pravastatin sodium) .Marland Kitchen... Take one by mouth daily    Tricor 145 Mg Tabs (Fenofibrate) .Marland Kitchen... Take one by mouth daily  Patient Instructions: 1)  Your physician has recommended you make the following change in your medication: START taking imdur 30mg  1/2 tab daily Prescriptions: NITROSTAT 0.4 MG SUBL (NITROGLYCERIN) 1 tablet under tongue at onset of chest pain; you may repeat every 5 minutes for up to 3 doses.  #25 x 2   Entered by:   Benedict Needy, RN   Authorized by:   Dossie Arbour MD   Signed by:   Benedict Needy, RN on 04/01/2010   Method used:   Electronically to        [id number]       [address]       [place], [place]  [address]       Ph: [phone]       Fax: [phone]   RxID:   [id number] ISOSORBIDE MONONITRATE CR 30 MG XR24H-TAB (ISOSORBIDE MONONITRATE) Take one tablet by mouth daily  #30 x 6   Entered by:   Benedict Needy, RN   Authorized by:   Dossie Arbour MD   Signed by:   Benedict Needy, RN on 04/01/2010   Method used:  Electronically to        [id number] (ret[facility])       [address]       [place], [place]  [address]       Ph: [phone]       Fax: [phone]   RxID:   [id number]

## 2010-07-19 NOTE — Letter (Signed)
Summary: Out of Work  [employer] at [facility]  [address] [place], [place] [address]   Phone: [phone]  Fax: [phone]    May 11, 2010   Employee:  Brandi Mahoney    To Whom It May Concern:   For Medical reasons, please excuse the above named employee from work for the following dates:  Start:   05/11/2010  End:   05/14/2010  If you need additional information, please feel free to contact our office.         Sincerely,    Shaune Leeks MD

## 2010-07-19 NOTE — Assessment & Plan Note (Signed)
Summary: ?SINUS INFECTION,EAR PAIN/CLE   Vital Signs:  Patient profile:   55 year old female Weight:      157.50 pounds Temp:     99.3 degrees F oral Pulse rate:   80 / minute Pulse rhythm:   regular BP sitting:   124 / 84  (left arm) Cuff size:   regular  Vitals Entered By: Sydell Axon LPN (May 11, 2010 11:31 AM) CC: ? Sinus infection, head congestion and left ear pain   History of Present Illness: Pt of Dr Patsy Lager who works at [employer] and has been congested since this weekend. She has clear mucous and sinus congestion. She has had feelings of chills, no known fever, temp here ok. She has headache in the whole left side of the head, ear pain in the left, plenty of rhinititis basically clear with some sneezing (no history of allergy probs), has ST, frequent cough that is occas productive of yellow.   Allergies: 1)  ! * Nyquil 2)  ! Penicillin  Physical Exam  General:  Well-developed,well-nourished,in no acute distress; alert,appropriate and cooperative throughout examination, congested and red-nosed. Head:  Normocephalic and atraumatic without obvious abnormalities. No apparent alopecia or balding. Sinuses tender on right side globally. Eyes:  Conjunctivsa mildly injected bilat. Ears:  External ear exam shows no significant lesions or deformities.  Otoscopic examination reveals clear canals, tympanic membranes are intact bilaterally without bulging, retraction, inflammation or discharge. Hearing is grossly normal bilaterally. Nose:  Inflamed with minimal discharge, slight septal dev right. Mouth:  pharyngeal erythema.   no exudates Neck:  No deformities, masses, or tenderness noted. Lungs:  Normal respiratory effort, chest expands symmetrically. Lungs are clear to auscultation, no crackles or wheezes. Heart:  Normal rate and regular rhythm. S1 and S2 normal without gallop, murmur, click, rub or other extra sounds.   Impression & Recommendations:  Problem # 1:  URI  (ICD-465.9) Assessment Unchanged Recurrent, see instructions. Her updated medication list for this problem includes:    Aspir-low 81 Mg Tbec (Aspirin) ..... One tablet once daily  Complete Medication List: 1)  Lovaza 1 Gm Caps (Omega-3-acid ethyl esters) .... Take two caps two times a day 2)  Topamax 50 Mg Tabs (Topiramate) .... Take one two times a day 3)  Zoloft 50 Mg Tabs (Sertraline hcl) .... Take one by mouth daily 4)  Pravachol 40 Mg Tabs (Pravastatin sodium) .... Take one by mouth daily 5)  Levothyroxine Sodium 50 Mcg Tabs (Levothyroxine sodium) .... Take one by mouth daily 6)  Accu-chek Aviva Strp (Glucose blood) .... Use as directed. 7)  Tricor 145 Mg Tabs (Fenofibrate) .... Take one by mouth daily 8)  Aspir-low 81 Mg Tbec (Aspirin) .... One tablet once daily 9)  Isosorbide Mononitrate Cr 30 Mg Xr24h-tab (Isosorbide mononitrate) .... Take one tablet by mouth daily 10)  Nitrostat 0.4 Mg Subl (Nitroglycerin) .Marland Kitchen.. 1 tablet under tongue at onset of chest pain; you may repeat every 5 minutes for up to 3 doses. 11)  Clarithromycin 500 Mg Tabs (Clarithromycin) .... One tab by mouth two times a day  Patient Instructions: 1)  Take Guaifenesin by [facility] or [facility] and getting MUCOUS RELIEF EXPECTORANT (400mg ), take 11/2 tabs by mouth AM and NOON. 2)  Drink lots of fluids anytime taking Guaifenesin.  3)  Take Aleve 2 tabs after btrfst and supper for one week. 4)  Push fluids. 5)  If sxs worsen, start Biaxin 6)  Take Claritin 10mg  daily for awhile. 7)  RTC if sxs worsen. Prescriptions: CLARITHROMYCIN 500 MG TABS (CLARITHROMYCIN) one tab by mouth two times a day  #20 x 0   Entered and Authorized by:   Shaune Leeks MD   Signed by:   Shaune Leeks MD on 05/11/2010   Method used:   Print then Give to Patient   RxID:   [id number]    Orders Added: 1)  Est. Patient Level III [44010]    Current Allergies (reviewed today): ! * NYQUIL !  PENICILLIN

## 2010-07-19 NOTE — Miscellaneous (Signed)
Summary: Consent to Special Procedure  Consent to Special Procedure   Imported By: Lanelle Bal 01/19/2010 11:16:36  _____________________________________________________________________  External Attachment:    Type:   Image     Comment:   External Document

## 2010-07-19 NOTE — Progress Notes (Signed)
Summary: ear pain  Phone Note Call from Patient Call back at [phone]   Caller: Patient Call For: Dr. Dayton Martes Summary of Call: Pt was seen yesterday and was told she had a virus.  Today her left ear is very painful, low grade fever.  Pt is asking if she needs to be seen again or can she have something called in.  [facility]. Initial call taken by: Lowella Petties CMA,  November 18, 2009 1:35 PM  Follow-up for Phone Call        I would give it time.  viruses can take 10-14 days to completely resolve.  No abx at this time.  If symptoms continue to worsen, call us back. Ruthe Mannan MD  November 18, 2009 1:35 PM  Pt is asking if she can have a work note for today through saturday, to go back on monday.  Also, what can she do for the pain in her ear? Follow-up by: Lowella Petties CMA,  November 18, 2009 1:51 PM  Additional Follow-up for Phone Call Additional follow up Details #1::        Ibuprofen for ear pain.  Note in my box for pick up. Ruthe Mannan MD  November 18, 2009 2:27 PM  Patient advised as instructed via telephone.  Note for work left at front desk for pick up.  Additional Follow-up by: Linde Gillis CMA Duncan Dull),  November 18, 2009 2:38 PM

## 2010-07-19 NOTE — Assessment & Plan Note (Signed)
Summary: F/U/CLE   Vital Signs:  Patient profile:   55 year old female Height:      64 inches Weight:      153.4 pounds BMI:     26.43 Temp:     98.4 degrees F oral Pulse rate:   88 / minute Pulse rhythm:   regular BP sitting:   90 / 60  (left arm) Cuff size:   regular  Vitals Entered By: Benny Lennert CMA Duncan Dull) (January 27, 2010 12:18 PM)   History of Present Illness: Chief complaint follow up   Hospital followup: Discharge documents were reviewed.  Patient was admitted, and subsequently discharged on January 21, 2010, and was admitted with a temperature up to 104, and subsequently developed myocarditis.  She was seen in the hospital by infectious disease and cardiology. Ultimately the patient went through echocardiogram and cardiac catheterization, without any significant coronary disease.  It was felt that she had symptoms most consistent with myocarditis. She is now currently on aspirin. This is felt to be due to a viral pathology.  Currently she is feeling well, she does have some shortness of breath. She did have some shortness of breath even with walking and helping a family member get to the dentist at this point.  Allergies: 1)  ! * Nyquil 2)  ! Penicillin  Past History:  Past medical, surgical, family and social histories (including risk factors) reviewed, and no changes noted (except as noted below).  Past Medical History: Asthma Depression Diabetes mellitus, type II Headache, Migraine IBS Hypothyroidism myocarditis, January 21, 2010  Past Surgical History: Appendectomy, 1974 Cholecystectomy, 1997 Hysterectomy, 1998  hospitalization: Myocarditis, [facility], 8/5, 2011 Cardiac Cath, 01/2010, [facility] Mariah Milling), no significant CAD  Family History: Reviewed history from 09/08/2008 and no changes required. Alcoholism, Drug Addiction: 0 Colon CA: 0 Ovarian/Uterine CA: 0 Breast CA: GM Lung CA: GP Prostate CA: 0 CAD: P CVA: 0 Sudden death < 50: 0 DM:  + Mental Illness: 0   ALS  Social History: Reviewed history from 09/08/2008 and no changes required. 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 CV:  Complains of shortness of breath with exertion; denies chest pain or discomfort.  Physical Exam  General:  Well-developed,well-nourished,in no acute distress; alert,appropriate and cooperative throughout examination Head:  Normocephalic and atraumatic without obvious abnormalities. No apparent alopecia or balding. Ears:  no external deformities.   Nose:  no external deformity.   Lungs:  Normal respiratory effort, chest expands symmetrically. Lungs are clear to auscultation, no crackles or wheezes. Heart:  Normal rate and regular rhythm. S1 and S2 normal without gallop, murmur, click, rub or other extra sounds. Extremities:  No clubbing, cyanosis, edema, or deformity noted with normal full range of motion of all joints.   Neurologic:  alert & oriented X3.   Skin:  right upper chest wound, clean dry and intact. Psych:  Cognition and judgment appear intact. Alert and cooperative with normal attention span and concentration. No apparent delusions, illusions, hallucinations   Impression & Recommendations:  Problem # 1:  OTHER ACUTE MYOCARDITIS (ICD-422.99) myocarditis, viral origin  encourage continuation of aspirin 325 mg. Followup with cardiology in the next one week or so. Appreciate Dr. Windell Hummingbird assistance.  Problem # 2:  CELLULITIS AND ABSCESS OF TRUNK (ICD-682.2) incision and drainage of right upper chest, looking very well. No further treatment needed.  The following medications were removed from  the medication list:    Bactrim 400-80 Mg Tabs (Sulfamethoxazole-trimethoprim) .Marland Kitchen... Take 2 tablets 2 times daily for 10 days  Complete Medication List: 1)  Lovaza 1 Gm Caps (Omega-3-acid ethyl  esters) .... Take two caps two times a day 2)  Topamax 50 Mg Tabs (Topiramate) .... Take one two times a day 3)  Zoloft 50 Mg Tabs (Sertraline hcl) .... Take one by mouth daily 4)  Pravachol 40 Mg Tabs (Pravastatin sodium) .... Take one by mouth daily 5)  Levothyroxine Sodium 50 Mcg Tabs (Levothyroxine sodium) .... Take one by mouth daily 6)  Metformin Hcl 500 Mg Tabs (Metformin hcl) .... Take one by mouth daily 7)  Accu-chek Aviva Strp (Glucose blood) .... Use as directed. 8)  Tricor 145 Mg Tabs (Fenofibrate) .... Take one by mouth daily  Current Allergies (reviewed today): ! * NYQUIL ! PENICILLIN

## 2010-07-19 NOTE — Assessment & Plan Note (Signed)
Summary: NP6/AMD   Visit Type:  Initial Consult Primary Provider:  Hannah Beat MD  CC:  F/U [facility].  Is feeling a little tired and short of breath..  History of Present Illness: Brandi Mahoney is a very pleasant 55 year old woman, patient of Dr. Dallas Schimke, who was recently seen at [facility] for non-ST elevation MI, was significant artifact and some elevation with troponin to 17, with cardiac catheterization showing nonobstructive left circumflex disease, presentation felt to be secondary to a viral myocarditis with low normal systolic function by cath, who presents for routine followup.  Overall, she states that she has been doing well. she has continued to have mild shortness of breath with exertion. She denies any edema. She's been feeling well otherwise. She denies any chest pain, lightheadedness, dizziness. She went back to work last week.  Echocardiogram on August 3 shows normal systolic function with ejection fraction at 55%, unable to rule out wall motion abnormalities in the inferior, posterior and septal walls, normal right ventricular systolic pressure, mild mitral regurg.  Cardiac catheterization August 4 showing mild atherosclerosis in the mid left circumflex after the takeoff of the OM1, mild diffuse LAD disease  Current Medications (verified): 1)  Lovaza 1 Gm Caps (Omega-3-Acid Ethyl Esters) .... Take Two Caps Two Times A Day 2)  Topamax 50 Mg Tabs (Topiramate) .... Take One Two Times A Day 3)  Zoloft 50 Mg Tabs (Sertraline Hcl) .... Take One By Mouth Daily 4)  Pravachol 40 Mg Tabs (Pravastatin Sodium) .... Take One By Mouth Daily 5)  Levothyroxine Sodium 50 Mcg Tabs (Levothyroxine Sodium) .... Take One By Mouth Daily 6)  Accu-Chek Aviva  Strp (Glucose Blood) .... Use As Directed. 7)  Tricor 145 Mg Tabs (Fenofibrate) .... Take One By Mouth Daily 8)  Aspirin 325 Mg Tabs (Aspirin) .... One Tablet Once Daily  Allergies (verified): 1)  ! * Nyquil 2)  ! Penicillin  Past  History:  Past Medical History: Last updated: 01/27/2010 Asthma Depression Diabetes mellitus, type II Headache, Migraine IBS Hypothyroidism myocarditis, January 21, 2010  Past Surgical History: Last updated: 01/27/2010 Appendectomy, 1974 Cholecystectomy, 1997 Hysterectomy, 1998  hospitalization: Myocarditis, [facility], 8/5, 2011 Cardiac Cath, 01/2010, [facility] ([facility]), no significant CAD  Family History: Last updated: 09/08/2008 Alcoholism, Drug Addiction: 0 Colon CA: 0 Ovarian/Uterine CA: 0 Breast CA: GM Lung CA: GP Prostate CA: 0 CAD: P CVA: 0 Sudden death < 50: 0 DM: + Mental Illness: 0   ALS  Social History: Last updated: 09/08/2008 Marital Status: Married Children:  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  The patient denies fever, weight loss, weight gain, vision loss, decreased hearing, hoarseness, chest pain, syncope, peripheral edema, prolonged cough, abdominal pain, incontinence, muscle weakness, depression, and enlarged lymph nodes.    Vital Signs:  Patient profile:   55 year old female Height:      64 inches Weight:      155 pounds BMI:     26.70 Pulse rate:   65 / minute BP sitting:   119 / 68  (left arm) Cuff size:   regular  Vitals Entered By: Bishop Dublin, CMA (February 08, 2010 11:19 AM)  Physical Exam  General:  Well-developed,well-nourished,in no acute distress; alert,appropriate and cooperative throughout examination Head:  normocephalic and atraumatic Neck:  Neck supple, no JVD. No masses, thyromegaly or abnormal cervical nodes. Lungs:  Clear bilaterally  to auscultation and percussion. Heart:  Non-displaced PMI, chest non-tender; regular rate and rhythm, S1, S2 without murmurs, rubs or gallops. Carotid upstroke normal, no bruit.  Pedals normal pulses. No edema, no varicosities. Abdomen:   abdomen soft  and non-tender without masses Msk:  Back normal, normal gait. Muscle strength and tone normal. Pulses:  pulses normal in all 4 extremities Extremities:  No clubbing or cyanosis. Neurologic:  Alert and oriented x 3. Skin:  Intact without lesions or rashes. Psych:  Normal affect.   Impression & Recommendations:  Problem # 1:  OTHER ACUTE MYOCARDITIS (ICD-422.99) she appears to be recovering well from her suspected viral myocarditis. Mild symptoms of shortness of breath with exertion. We have encouraged her to start light rehabilitation on her treadmill, 10 minutes per day, twice a day if tolerated.  I have suggested that she contact me if her symptoms get worse or if she develops lower extremity edema, cough, abdominal fullness.  We have suggested that she can decrease her aspirin from 325 mg to 81 mg daily.  Problem # 2:  HYPERTRIGLYCERIDEMIA (ICD-272.1) given her mild circumflex and LAD disease, it will be important that we continue aggressive lipid management. She is due to follow up with Dr. Dallas Schimke.  Her updated medication list for this problem includes:    Lovaza 1 Gm Caps (Omega-3-acid ethyl esters) .Marland Kitchen... Take two caps two times a day    Pravachol 40 Mg Tabs (Pravastatin sodium) .Marland Kitchen... Take one by mouth daily    Tricor 145 Mg Tabs (Fenofibrate) .Marland Kitchen... Take one by mouth daily  Other Orders: EKG w/ Interpretation (93000)  Patient Instructions: 1)  Your physician recommends that you schedule a follow-up appointment in: as needed  2)  Your physician has recommended you make the following change in your medication: Decrease Aspririn 81mg  daily   Appended Document: NP6/AMD EKG today shows normal sinus rhythm with rate of 62 beats per minute, no significant ST-T wave changes.

## 2010-07-21 NOTE — Letter (Signed)
Summary: Out of Work  [employer] at [facility]  [address] [place], [place] [address]   Phone: [phone]  Fax: [phone]    June 08, 2010   Employee:  Brandi Mahoney    To Whom It May Concern:   For Medical reasons, please excuse the above named employee from work for yesterday and today.  Can go back to work on Friday if no fever or diarrhea.    If you need additional information, please feel free to contact our office.         Sincerely,    Crawford Givens MD

## 2010-07-21 NOTE — Assessment & Plan Note (Signed)
   Vital Signs:  Patient profile:   55 year old female Height:      64 inches Weight:      156.25 pounds BMI:     26.92 Temp:     98.2 degrees F oral Pulse rate:   80 / minute Pulse rhythm:   regular BP sitting:   116 / 76  (left arm) Cuff size:   regular  Vitals Entered By: Delilah Shan CMA Duncan Dull) (June 08, 2010 4:33 PM) CC: Still has stomach virus   History of Present Illness: Mult sick contacts with vomiting and then diarrhea.  Still with diarrhea but vomiting has gotten better.  Sx started on Sunday.  Had fever a few days ago, >101.  Feeling better today.  No blood in diarrhea.  Still with very loose stools through yesterday but no BMs today.  abdominal cramping is improving.  used promethazine supp for symptoms prev.  Staying off diary now.    This is 3rd illness in last 6 months.  She was asking about the frequency of recent illnesses.    Allergies: 1)  ! * Nyquil 2)  ! Penicillin  Review of Systems       See HPI.  Otherwise negative.    Physical Exam  General:  no apparent distress normocephalic atraumatic  mucous membranes moist neck supple with normal range of motion regular rate and rhythm clear to auscultation bilaterally abdomen soft, not tender to palpation, normal bs ext well perfused   Impression & Recommendations:  Problem # 1:  GASTROENTERITIS (ICD-558.9) Resolving.  Supportive tx.  Nontoxic.  I would expect the diarrhea to gradually resolve.  ADAT.  Pt with longterm considerations re: prev illnesses and I'll d/w her:PMD.  I told her that with DM2, even diet controlled, she was at higher risk for other illnesses.    Complete Medication List: 1)  Lovaza 1 Gm Caps (Omega-3-acid ethyl esters) .... Take two caps two times a day 2)  Topamax 50 Mg Tabs (Topiramate) .... Take one two times a day 3)  Zoloft 50 Mg Tabs (Sertraline hcl) .... Take one by mouth daily 4)  Pravachol 40 Mg Tabs (Pravastatin sodium) .... Take one by mouth daily 5)   Levothyroxine Sodium 50 Mcg Tabs (Levothyroxine sodium) .... Take one by mouth daily 6)  Accu-chek Aviva Strp (Glucose blood) .... Use as directed. 7)  Tricor 145 Mg Tabs (Fenofibrate) .... Take one by mouth daily 8)  Aspir-low 81 Mg Tbec (Aspirin) .... One tablet once daily 9)  Nitrostat 0.4 Mg Subl (Nitroglycerin) .Marland Kitchen.. 1 tablet under tongue at onset of chest pain; you may repeat every 5 minutes for up to 3 doses.  Patient Instructions: 1)  I would gradually advance your diet.  Keep drinking plenty of clear fluids.  I'll talk to Dr. Patsy Lager.  Take care.    Orders Added: 1)  Est. Patient Level III [16109]    Current Allergies (reviewed today): ! * NYQUIL ! PENICILLIN

## 2010-07-21 NOTE — Letter (Signed)
Summary: Out of Work  [employer] at [facility]  [address] [place], [place] [address]   Phone: [phone]  Fax: [phone]    June 01, 2010   Employee:  Jax Londo    To Whom It May Concern:   For Medical reasons, please excuse the above named employee from work for the following dates:  Start:  June 01, 2010  End:  June 01, 2010 4:26 PM   If you need additional information, please feel free to contact our office.         Sincerely,    Dwana Curd. Para March, M.D.  [facility]

## 2010-07-21 NOTE — Assessment & Plan Note (Signed)
Summary: still has stomoch virus/dlo   Allergies: 1)  ! * Nyquil 2)  ! Penicillin   Complete Medication List: 1)  Lovaza 1 Gm Caps (Omega-3-acid ethyl esters) .... Take two caps two times a day 2)  Topamax 50 Mg Tabs (Topiramate) .... Take one two times a day 3)  Zoloft 50 Mg Tabs (Sertraline hcl) .... Take one by mouth daily 4)  Pravachol 40 Mg Tabs (Pravastatin sodium) .... Take one by mouth daily 5)  Levothyroxine Sodium 50 Mcg Tabs (Levothyroxine sodium) .... Take one by mouth daily 6)  Accu-chek Aviva Strp (Glucose blood) .... Use as directed. 7)  Tricor 145 Mg Tabs (Fenofibrate) .... Take one by mouth daily 8)  Aspir-low 81 Mg Tbec (Aspirin) .... One tablet once daily 9)  Nitrostat 0.4 Mg Subl (Nitroglycerin) .Marland Kitchen.. 1 tablet under tongue at onset of chest pain; you may repeat every 5 minutes for up to 3 doses.

## 2010-07-21 NOTE — Progress Notes (Signed)
Summary: wants labs   Phone Note Call from Patient Call back at [phone]   Caller: Patient Call For: Dr. Para March  Summary of Call: Patient says that she has been sick on and off for a couple of weeks. Had a sinus infection in november, then she was seen for numbness in her hands. She says that now she has a stomach bug and she is coming in tomorrow for that. She is asking if she could come in today for labs so that you could review them at her appt. I expalined to the patient that it might be best to have the labs done same day after you have reviewed her symptoms so that you know exactly what to check for. Patient still wanted me to ask. Please advise.  Initial call taken by: Melody Comas,  June 07, 2010 11:14 AM  Follow-up for Phone Call        Let me see her tomorrow and then we can do what labs need to be done.  thanks.  Follow-up by: Crawford Givens MD,  June 07, 2010 11:35 AM  Additional Follow-up for Phone Call Additional follow up Details #1::        Patient notified.  Additional Follow-up by: Melody Comas,  June 07, 2010 1:03 PM

## 2010-07-21 NOTE — Assessment & Plan Note (Signed)
Summary: fingers are feeling numb/alc   Vital Signs:  Patient profile:   54 year old female Height:      64 inches Weight:      160 pounds BMI:     27.56 Temp:     98.5 degrees F oral Pulse rate:   92 / minute Pulse rhythm:   regular BP sitting:   124 / 80  (left arm) Cuff size:   regular  Vitals Entered By: Delilah Shan CMA Duncan Dull) (June 01, 2010 3:58 PM) CC: Fingers are feeling numb   History of Present Illness: Last several months has had numbness on L 3rd and 4th fingers.  It wakes the patient up.  Getting slightly worse.  Not on R hand.  Occ tingling on L 1st/2nd/5th fingers.  H/o DM2 and on topamax.  Had DM2 for about 5 years, controlled.  No numbness in feet now but that had been present previously.  On topamax for migraines.    L handed.   Sx aren't worse at work.  Sx don't get better with position change.   No weakness.  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    Allergies: 1)  ! * Nyquil 2)  ! 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  Otherwise negative.    Physical Exam  General:  no apparent distress normocephalic atraumatic  mucous membranes moist neck supple with normal range of motion CN 2-12 wnl B, S/S/DTR wnl x4 except for decrease in sens on L #3-4 digits. no weakness.  increase in numbness with persistent flexion of wrist regular rate and rhythm clear to auscultation bilaterally ext well perfused.    Impression &  Recommendations:  Problem # 1:  PARESTHESIA, HANDS (ICD-782.0) This may be due to the topamax, DM2, or DDD in the neck but I think carpal tunnel is more likely given the exam today.  I would brace for a week or two.  If not improving, consider decrease in topamax.  If still not improving beyond that, she may need nerve conduction and/or imaging.  She is okay for outpatient follow up and can call back as needed.  She understood.   Complete Medication List: 1)  Lovaza 1 Gm Caps (Omega-3-acid ethyl esters) .... Take two caps two times a day 2)  Topamax 50 Mg Tabs (Topiramate) .... Take one two times a day 3)  Zoloft 50 Mg Tabs (Sertraline hcl) .... Take one by mouth daily 4)  Pravachol 40 Mg Tabs (Pravastatin sodium) .... Take one by mouth daily 5)  Levothyroxine Sodium 50 Mcg Tabs (Levothyroxine sodium) .... Take one by mouth daily 6)  Accu-chek Aviva Strp (Glucose blood) .... Use as directed. 7)  Tricor 145 Mg Tabs (Fenofibrate) .... Take one by mouth daily 8)  Aspir-low 81 Mg Tbec (Aspirin) .... One tablet once daily 9)  Isosorbide Mononitrate Cr 30 Mg Xr24h-tab (Isosorbide mononitrate) .... Take one tablet by mouth daily 10)  Nitrostat 0.4 Mg Subl (Nitroglycerin) .Marland Kitchen.. 1 tablet under tongue at onset of chest pain; you may repeat every 5 minutes for up to 3 doses.  11)  Clarithromycin 500 Mg Tabs (Clarithromycin) .... One tab by mouth two times a day  Patient Instructions: 1)  I would use the wrist splint (day and night) and let me know if you aren't improving.  We can talk about the topamax dose at that point.  If you still aren't improving beyond that, we'll talk about options.  Take care.    Orders Added: 1)  Est. Patient Level III [95621]    Current Allergies (reviewed today): ! * NYQUIL ! PENICILLIN

## 2010-09-01 LAB — CARDIAC PANEL(CRET KIN+CKTOT+MB+TROPI)
CK, MB: 1.9 ng/mL (ref 0.3–4.0)
CK, MB: 2.1 ng/mL (ref 0.3–4.0)
Relative Index: INVALID (ref 0.0–2.5)
Relative Index: INVALID (ref 0.0–2.5)
Total CK: 70 U/L (ref 7–177)
Troponin I: 0.01 ng/mL (ref 0.00–0.06)

## 2010-09-01 LAB — CBC
HCT: 39.1 % (ref 36.0–46.0)
Hemoglobin: 12.5 g/dL (ref 12.0–15.0)
Hemoglobin: 12.6 g/dL (ref 12.0–15.0)
MCH: 26.5 pg (ref 26.0–34.0)
Platelets: 275 10*3/uL (ref 150–400)
RBC: 4.69 MIL/uL (ref 3.87–5.11)
RBC: 4.71 MIL/uL (ref 3.87–5.11)
WBC: 7.3 10*3/uL (ref 4.0–10.5)

## 2010-09-01 LAB — COMPREHENSIVE METABOLIC PANEL
ALT: 27 U/L (ref 0–35)
AST: 28 U/L (ref 0–37)
Albumin: 4.4 g/dL (ref 3.5–5.2)
Chloride: 112 mEq/L (ref 96–112)
Creatinine, Ser: 0.68 mg/dL (ref 0.4–1.2)
GFR calc Af Amer: >60 mL/min (ref >60)
Potassium: 3.5 mEq/L (ref 3.5–5.1)
Sodium: 141 mEq/L (ref 135–145)
Total Bilirubin: 0.6 mg/dL (ref 0.3–1.2)

## 2010-09-01 LAB — DIFFERENTIAL
Basophils Absolute: 0 10*3/uL (ref 0.0–0.1)
Basophils Relative: 0 % (ref 0–1)
Monocytes Relative: 7 % (ref 3–12)
Neutro Abs: 4.3 10*3/uL (ref 1.7–7.7)
Neutrophils Relative %: 56 % (ref 43–77)

## 2010-09-01 LAB — POCT CARDIAC MARKERS: CKMB, poc: <1 ng/mL — ABNORMAL LOW (ref 1.0–8.0)

## 2010-09-01 LAB — TROPONIN I: Troponin I: 0.03 ng/mL (ref 0.00–0.06)

## 2010-09-01 LAB — CK TOTAL AND CKMB (NOT AT ARMC)
CK, MB: 2.4 ng/mL (ref 0.3–4.0)
Total CK: 106 U/L (ref 7–177)

## 2010-09-01 LAB — PROTIME-INR: INR: 1.05 (ref 0.00–1.49)

## 2010-09-13 ENCOUNTER — Other Ambulatory Visit: Payer: Self-pay | Admitting: *Deleted

## 2010-09-13 MED ORDER — SERTRALINE HCL 50 MG PO TABS: 50.0000 mg | ORAL_TABLET | Freq: Every day | ORAL | Status: DC

## 2010-09-13 NOTE — Telephone Encounter (Signed)
Rx called to pharmacy

## 2010-09-13 NOTE — Telephone Encounter (Signed)
Dr. Cyndie Chime patient

## 2010-09-16 ENCOUNTER — Encounter: Payer: Self-pay | Admitting: Family Medicine

## 2010-10-07 ENCOUNTER — Emergency Department: Payer: Self-pay | Admitting: Emergency Medicine

## 2010-10-16 ENCOUNTER — Other Ambulatory Visit: Payer: Self-pay | Admitting: Family Medicine

## 2010-12-02 ENCOUNTER — Other Ambulatory Visit: Payer: Self-pay | Admitting: *Deleted

## 2010-12-05 MED ORDER — TOPIRAMATE 50 MG PO TABS: ORAL_TABLET | ORAL | Status: DC

## 2010-12-26 ENCOUNTER — Other Ambulatory Visit: Payer: Self-pay | Admitting: *Deleted

## 2010-12-26 MED ORDER — SERTRALINE HCL 50 MG PO TABS: 50.0000 mg | ORAL_TABLET | Freq: Every day | ORAL | Status: DC

## 2011-03-15 ENCOUNTER — Other Ambulatory Visit: Payer: Self-pay | Admitting: *Deleted

## 2011-03-15 MED ORDER — LEVOTHYROXINE SODIUM 50 MCG PO TABS: 50.0000 ug | ORAL_TABLET | Freq: Every day | ORAL | Status: DC

## 2011-05-24 ENCOUNTER — Encounter: Payer: Self-pay | Admitting: Family Medicine

## 2011-05-24 ENCOUNTER — Ambulatory Visit (INDEPENDENT_AMBULATORY_CARE_PROVIDER_SITE_OTHER): Payer: PRIVATE HEALTH INSURANCE | Admitting: Family Medicine

## 2011-05-24 VITALS — BP 110/72 | HR 69 | Temp 98.1°F | Ht 64.0 in | Wt 161.5 lb

## 2011-05-24 DIAGNOSIS — L089 Local infection of the skin and subcutaneous tissue, unspecified: Secondary | ICD-10-CM

## 2011-05-24 DIAGNOSIS — L729 Follicular cyst of the skin and subcutaneous tissue, unspecified: Secondary | ICD-10-CM

## 2011-05-24 DIAGNOSIS — L723 Sebaceous cyst: Secondary | ICD-10-CM

## 2011-05-24 NOTE — Progress Notes (Signed)
  Patient Name: Brandi Mahoney Date of Birth: 09-09-1955 Age: 55 y.o. Medical Record Number: [id number] Gender: female  History of Present Illness:  Brandi Mahoney is a 55 y.o. very pleasant female patient who presents with the following:  Marina Goodell pleasant patient known well, who presents for left-sided posterior shoulder cyst has been present for a number of years, but over the last 2 weeks it has become painful and reddened. She has had a small degree of pus that was expressed while at home after showering. She has not had any fever chills, or systemic symptoms.  Past Medical History, Surgical History, Social History, Family History, and Problem List have been reviewed in EHR and updated if relevant.  Review of Systems: As above. No fever. No shortness of breath, no chest pain. No chills or sweats.  Physical Examination: Filed Vitals:   05/24/11 1229  BP: 110/72  Pulse: 69  Temp: 98.1 F (36.7 C)  TempSrc: Oral  Height: 5\' 4"  (1.626 m)  Weight: 161 lb 8 oz (73.256 kg)    Body mass index is 27.72 kg/(m^2).   GEN: WDWN, NAD, Non-toxic, Alert & Oriented x 3 HEENT: Atraumatic, Normocephalic.  Ears and Nose: No external deformity. EXTR: No clubbing/cyanosis/edema NEURO: Normal gait.  PSYCH: Normally interactive. Conversant. Not depressed or anxious appearing.  Calm demeanor.   Left posterior shoulder, there is a elevated tender slightly reddened firm area that is tender to palpation with a very small central hole that has some expressible pus present. It is approximately 2 and half centimeters across.  Assessment and Plan: 1. Infected cyst of skin  Wound culture    This appears to be an infected sebaceous cyst or other cyst of the skin with a small abscess. Will incise and drain this area.  I&D, Complex Indication: suspect abscess/infected cyst L posterior shoulder Pt complaints of: erythema, pain, swelling Location: Left posterior shoulder Size: 2 half centimeters  across Verbal informed consent obtained.  Pt aware of risks not limited to but including infection, bleeding, damage to near by organs. Written consent also obtained. Prep: etoh Anesthesia: 1%lidocaine with epi, good effect Incision made with #11 blade Would explored and loculations removed using a hemostat clamp. Additionally, other purulent material was removed using hemostat clamp Amount expressed: Approximately 3 cc of purulent dense material Tolerated well Routine postprocedure instructions keep area clean and bandaged, follow up if concerns/spreading erythema/pain. After 24 hours, shower twice a day to flush the wound

## 2011-05-27 LAB — WOUND CULTURE

## 2011-06-04 ENCOUNTER — Other Ambulatory Visit: Payer: Self-pay | Admitting: Family Medicine

## 2011-06-14 ENCOUNTER — Encounter: Payer: PRIVATE HEALTH INSURANCE | Admitting: Obstetrics & Gynecology

## 2011-06-20 HISTORY — PX: COLONOSCOPY: SHX174

## 2011-07-16 ENCOUNTER — Other Ambulatory Visit: Payer: Self-pay | Admitting: Family Medicine

## 2011-07-21 ENCOUNTER — Ambulatory Visit (INDEPENDENT_AMBULATORY_CARE_PROVIDER_SITE_OTHER): Payer: PRIVATE HEALTH INSURANCE | Admitting: Family Medicine

## 2011-07-21 ENCOUNTER — Encounter: Payer: Self-pay | Admitting: Family Medicine

## 2011-07-21 DIAGNOSIS — R079 Chest pain, unspecified: Secondary | ICD-10-CM

## 2011-07-21 MED ORDER — NITROGLYCERIN 0.4 MG SL SUBL: SUBLINGUAL_TABLET | SUBLINGUAL | Status: DC

## 2011-07-21 NOTE — Patient Instructions (Signed)
If you have any chest pain over the weekend, then take the nitro and call 911 (or go to the ER).  I'll ask Shirlee Limerick to call you about seeing cardiology.

## 2011-07-21 NOTE — Progress Notes (Signed)
L upper back, L arm and L jaw pain over last few days, worse with exertion.  NTG responsive.  2011 dx'd with myocarditis.  DM2, controlled w/o meds.  SOB with exertion.  No CP now.  Not sob now. No known MI in past. No BLE edema.  Sleeping on 1 pillow.    House foreclosure may happen and this has been difficult for her.    PMH and SH reviewed  ROS: See HPI, otherwise noncontributory.  Meds, vitals, and allergies reviewed.   GEN: nad, alert and oriented HEENT: mucous membranes moist NECK: supple w/o LA CV:  Murmur noted, rrr PULM: ctab, no inc wob ABD: soft, +bs EXT: no edema SKIN: no acute rash  ekg reviewed and compared to prev, no sig change

## 2011-07-23 ENCOUNTER — Encounter: Payer: Self-pay | Admitting: Family Medicine

## 2011-07-23 DIAGNOSIS — R079 Chest pain, unspecified: Secondary | ICD-10-CM | POA: Insufficient documentation

## 2011-07-23 NOTE — Assessment & Plan Note (Signed)
With DM2, HLD.  No CP now.  Use NTG if pain over the weekend and refer to cards.  She agrees.  >25 min spent with face to face with patient, >50% counseling and/or coordinating care.  Okay for outpatient f/u.  I don't see reason to send to ER at this time.

## 2011-07-27 ENCOUNTER — Ambulatory Visit (INDEPENDENT_AMBULATORY_CARE_PROVIDER_SITE_OTHER): Payer: PRIVATE HEALTH INSURANCE | Admitting: Cardiovascular Disease

## 2011-07-27 ENCOUNTER — Other Ambulatory Visit: Payer: Self-pay | Admitting: Cardiovascular Disease

## 2011-07-27 ENCOUNTER — Encounter: Payer: Self-pay | Admitting: Cardiovascular Disease

## 2011-07-27 DIAGNOSIS — R0602 Shortness of breath: Secondary | ICD-10-CM

## 2011-07-27 DIAGNOSIS — E785 Hyperlipidemia, unspecified: Secondary | ICD-10-CM

## 2011-07-27 DIAGNOSIS — I2510 Atherosclerotic heart disease of native coronary artery without angina pectoris: Secondary | ICD-10-CM

## 2011-07-27 DIAGNOSIS — E119 Type 2 diabetes mellitus without complications: Secondary | ICD-10-CM

## 2011-07-27 DIAGNOSIS — R079 Chest pain, unspecified: Secondary | ICD-10-CM

## 2011-07-27 LAB — CK-MB: CK-MB: 0.7 ng/mL (ref 0.5–3.6)

## 2011-07-27 MED ORDER — ALBUTEROL 90 MCG/ACT IN AERS: 2.0000 | INHALATION_SPRAY | Freq: Four times a day (QID) | RESPIRATORY_TRACT | Status: DC | PRN

## 2011-07-27 NOTE — Assessment & Plan Note (Addendum)
Recent presentation of chest pain, shortness of breath, jaw pain, left arm pain, posterior shoulder pain in the past week. Previously had minimal coronary artery disease with non-STEMI felt secondary to myocarditis. She does report having recent viral infection in January. We will check cardiac enzymes today. We have ordered an echocardiogram. We have encouraged she continue nitroglycerin for chest discomfort.   There is significant stress in her life with her husband's job and needing to possibly sell her house. Uncertain if this is playing a role.

## 2011-07-27 NOTE — Assessment & Plan Note (Signed)
Uncertain if her shortness of breath is secondary to cardiac etiology. Echo is pending. No signs of significant heart failure on exam. She is concerned about previous asthma symptoms. We will send in an albuterol inhaler at her request.

## 2011-07-27 NOTE — Progress Notes (Signed)
Patient ID: Brandi Mahoney, female    DOB: 1955-11-29, 56 y.o.   MRN: [id number]  HPI Comments: Brandi Mahoney is a very pleasant 56 year old woman with hx of  non-ST elevation MI,  elevation of troponin to 17, with cardiac catheterization showing nonobstructive left circumflex disease, presentation felt to be secondary to a viral myocarditis with low normal systolic function by cath, Who had a second admission to [facility] for chest discomfort, felt to be noncardiac with echocardiogram showing normal systolic function who presents for Visit with symptoms of shortness of breath, chest pain.  She reports that over the past week, she has had significant fatigue, left jaw pain radiating to her left arm that improves with nitroglycerin, chest discomfort. Symptoms seem to come on at rest and stress. She has had poor sleep. She does have very significant shortness of breath with exertion. 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 She has had significant stress at home as her husband has had a drop in in income and they  might have to sell their house.  She does have obstructive sleep apnea and does not use CPAP. She also reports pain in her posterior left shoulder.   Cardiac catheterization January 20 2010 showing mild atherosclerosis in the mid left circumflex after the takeoff of the OM1, mild diffuse LAD disease repeat echocardiogram on March 19, 2010 showing ejection fraction 60-65%, diastolic dysfunction, mild LVH   EKG shows normal sinus rhythm with rate 72 beats minute, no significant ST or T wave changes      Outpatient Encounter Prescriptions as of 07/27/2011  Medication Sig Dispense Refill  . aspirin 81 MG tablet Take 81 mg by mouth daily.        Marland Kitchen glucose blood (ACCU-CHEK AVIVA) test strip Use as instructed       . levothyroxine (SYNTHROID, LEVOTHROID) 50 MCG tablet Take 1 tablet (50 mcg total) by mouth daily.  30 tablet  6  . nitroGLYCERIN (NITROSTAT) 0.4 MG SL tablet 1 tablet  under tongue at onset of chest pain; you may repeat every 5 minutes for up to 3 doses.  25 tablet  12  . pravastatin (PRAVACHOL) 40 MG tablet TAKE 1 TABLET BY MOUTH AT BEDTIME  30 tablet  2  . sertraline (ZOLOFT) 50 MG tablet TAKE 1 TABLET (50 MG TOTAL) BY MOUTH DAILY.  30 tablet  5  . topiramate (TOPAMAX) 50 MG tablet Take one tablet twice daily.  60 tablet  5  . TRICOR 145 MG tablet TAKE 1 TABLET BY MOUTH EVERY DAY  30 tablet  11     Review of Systems  Constitutional: Negative.   HENT: Negative.        Jaw pain  Eyes: Negative.   Respiratory: Positive for shortness of breath.   Cardiovascular: Positive for chest pain.  Gastrointestinal: Negative.   Musculoskeletal: Negative.   Skin: Negative.   Neurological: Negative.   Hematological: Negative.   Psychiatric/Behavioral: Positive for sleep disturbance.  All other systems reviewed and are negative.    BP 118/68  Pulse 71  Ht 5\' 4"  (1.626 m)  Wt 166 lb (75.297 kg)  BMI 28.49 kg/m2  Physical Exam  Nursing note and vitals reviewed. Constitutional: She is oriented to person, place, and time. She appears well-developed and well-nourished.  HENT:  Head: Normocephalic.  Nose: Nose normal.  Mouth/Throat: Oropharynx is clear and moist.  Eyes: Conjunctivae are normal. Pupils are equal, round, and reactive to light.  Neck: Normal range  of motion. Neck supple. No JVD present.  Cardiovascular: Normal rate, regular rhythm, S1 normal, S2 normal, normal heart sounds and intact distal pulses.  Exam reveals no gallop and no friction rub.   No murmur heard. Pulmonary/Chest: Effort normal and breath sounds normal. No respiratory distress. She has no wheezes. She has no rales. She exhibits no tenderness.  Abdominal: Soft. Bowel sounds are normal. She exhibits no distension. There is no tenderness.  Musculoskeletal: Normal range of motion. She exhibits no edema and no tenderness.  Lymphadenopathy:    She has no cervical adenopathy.    Neurological: She is alert and oriented to person, place, and time. Coordination normal.  Skin: Skin is warm and dry. No rash noted. No erythema.  Psychiatric: She has a normal mood and affect. Her behavior is normal. Judgment and thought content normal.         Assessment and Plan

## 2011-07-27 NOTE — Assessment & Plan Note (Signed)
We have encouraged continued exercise, careful diet management in an effort to lose weight. 

## 2011-07-27 NOTE — Assessment & Plan Note (Signed)
We have suggested she stay on her current cholesterol medication regimen.

## 2011-07-27 NOTE — Patient Instructions (Addendum)
You are doing well. No medication changes were made.  We will check labs today (cardiac enz) We have ordered an echocardiogram to evaluate your heart function We will call you with the results. If your shortness of breath gets worse, please call the office.   Please call us if you have new issues that need to be addressed before your next appt.  Your physician wants you to follow-up in: 1 months.  You will receive a reminder letter in the mail two months in advance. If you don't receive a letter, please call our office to schedule the follow-up appointment.

## 2011-08-08 ENCOUNTER — Ambulatory Visit (INDEPENDENT_AMBULATORY_CARE_PROVIDER_SITE_OTHER): Payer: PRIVATE HEALTH INSURANCE | Admitting: Family Medicine

## 2011-08-08 ENCOUNTER — Encounter: Payer: Self-pay | Admitting: Family Medicine

## 2011-08-08 DIAGNOSIS — R059 Cough, unspecified: Secondary | ICD-10-CM

## 2011-08-08 DIAGNOSIS — R05 Cough: Secondary | ICD-10-CM

## 2011-08-08 MED ORDER — OMEPRAZOLE 20 MG PO CPDR: 20.0000 mg | DELAYED_RELEASE_CAPSULE | Freq: Every day | ORAL | Status: DC

## 2011-08-08 MED ORDER — BENZONATATE 200 MG PO CAPS: 200.0000 mg | ORAL_CAPSULE | Freq: Three times a day (TID) | ORAL | Status: AC | PRN

## 2011-08-08 NOTE — Progress Notes (Signed)
Saw cards for chest pain and has echo pending.  Occ twinge of shoulder pain and some occ L arm pain.  Hasn't used the NTG.    Coughing but w/o a concurrent cold.  No sputum.  Present for a few weeks.  Present day and night, sleep is disrupted.  Taking halls and otc cough medicine w/o much relief.  It feels like the trigger for the cough is in the throat.  Cough can be worse at night than during the day.  Episodic fits of coughing.  No fevers. Some heartburn, going on chronically.  No vomiting.  No change in taste in mouth.  No rhinorrhea. No postnasal gtt.  Albuterol doesn't help with the cough.  No wheeze.  H/o OSA but not CPAP.    Meds, vitals, and allergies reviewed.   ROS: See HPI.  Otherwise, noncontributory.  nad ncatmmm ctab  Rrr, murmur noted Ext w/o edema

## 2011-08-08 NOTE — Patient Instructions (Signed)
I would elevate the head of your bed, take generic prilosec/omeprazole 20mg  a day, and take tessalon for the cough.  Take care.

## 2011-08-10 DIAGNOSIS — R05 Cough: Secondary | ICD-10-CM | POA: Insufficient documentation

## 2011-08-10 DIAGNOSIS — R059 Cough, unspecified: Secondary | ICD-10-CM | POA: Insufficient documentation

## 2011-08-10 NOTE — Assessment & Plan Note (Signed)
ddx d/w pt.   Asthma- but SABA didn't help. She has no post nasal gtt No antecedent URI + heartburn and this could be the cause.   No ACE use.   Will elevate the head of bed, take generic prilosec/omeprazole 20mg  a day, and take tessalon for the cough.  Await cards testing.

## 2011-08-15 ENCOUNTER — Other Ambulatory Visit: Payer: Self-pay

## 2011-08-15 ENCOUNTER — Other Ambulatory Visit (INDEPENDENT_AMBULATORY_CARE_PROVIDER_SITE_OTHER): Payer: PRIVATE HEALTH INSURANCE

## 2011-08-15 DIAGNOSIS — R079 Chest pain, unspecified: Secondary | ICD-10-CM

## 2011-08-15 DIAGNOSIS — I2510 Atherosclerotic heart disease of native coronary artery without angina pectoris: Secondary | ICD-10-CM

## 2011-08-15 DIAGNOSIS — R0602 Shortness of breath: Secondary | ICD-10-CM

## 2011-08-21 ENCOUNTER — Ambulatory Visit: Payer: PRIVATE HEALTH INSURANCE | Admitting: Cardiovascular Disease

## 2011-09-08 ENCOUNTER — Ambulatory Visit (INDEPENDENT_AMBULATORY_CARE_PROVIDER_SITE_OTHER): Payer: PRIVATE HEALTH INSURANCE | Admitting: Cardiovascular Disease

## 2011-09-08 ENCOUNTER — Encounter: Payer: Self-pay | Admitting: Cardiovascular Disease

## 2011-09-08 VITALS — BP 134/83 | HR 84 | Ht 64.0 in | Wt 164.0 lb

## 2011-09-08 DIAGNOSIS — E785 Hyperlipidemia, unspecified: Secondary | ICD-10-CM

## 2011-09-08 DIAGNOSIS — Z0000 Encounter for general adult medical examination without abnormal findings: Secondary | ICD-10-CM

## 2011-09-08 DIAGNOSIS — R079 Chest pain, unspecified: Secondary | ICD-10-CM

## 2011-09-08 DIAGNOSIS — E119 Type 2 diabetes mellitus without complications: Secondary | ICD-10-CM

## 2011-09-08 NOTE — Assessment & Plan Note (Signed)
Significantly improve symptoms. Occasional twinges. No further workup at this time

## 2011-09-08 NOTE — Patient Instructions (Signed)
You are doing well. No medication changes were made.  We will check your labs when you are available  Please call us if you have new issues that need to be addressed before your next appt.  Your physician wants you to follow-up in: 12 months.  You will receive a reminder letter in the mail two months in advance. If you don't receive a letter, please call our office to schedule the follow-up appointment.

## 2011-09-08 NOTE — Progress Notes (Signed)
Patient ID: Brandi Mahoney, female    DOB: Dec 11, 1955, 56 y.o.   MRN: [id number]  HPI Comments: [name] is a very pleasant 56 year old woman with hx of  non-ST elevation MI,  elevation of troponin to 17, with cardiac catheterization showing nonobstructive left circumflex disease, presentation felt to be secondary to a viral myocarditis with low normal systolic function by cath, Who had a second admission to [facility] for chest discomfort, felt to be noncardiac with echocardiogram showing normal systolic function who presents for routine followup. On her last clinic visit, she had an episode of chest pain. Significant stress at home  She has had poor sleep. She does have obstructive sleep apnea and does not use CPAP. She has had some problems with GERD and for a short time was taking omeprazole. She felt she was only supposed to take this for a short period of time. She denies any significant chest pain recently, occasional twinges.  Cardiac catheterization January 20 2010: mild atherosclerosis in the mid left circumflex after the takeoff of the OM1, mild diffuse LAD disease repeat echocardiogram on March 19, 2010 showing ejection fraction 60-65%, diastolic dysfunction, mild LVH Old EKG shows normal sinus rhythm with rate 72 beats minute, no significant ST or T wave changes      Outpatient Encounter Prescriptions as of 09/08/2011  Medication Sig Dispense Refill  . albuterol (PROVENTIL,VENTOLIN) 90 MCG/ACT inhaler Inhale 2 puffs into the lungs every 6 (six) hours as needed for wheezing.  17 g  12  . aspirin 81 MG tablet Take 81 mg by mouth daily.        Marland Kitchen glucose blood (ACCU-CHEK AVIVA) test strip Use as instructed       . levothyroxine (SYNTHROID, LEVOTHROID) 50 MCG tablet Take 1 tablet (50 mcg total) by mouth daily.  30 tablet  6  . nitroGLYCERIN (NITROSTAT) 0.4 MG SL tablet 1 tablet under tongue at onset of chest pain; you may repeat every 5 minutes for up to 3 doses.  25 tablet   12  . omeprazole (PRILOSEC) 20 MG capsule Take 20 mg by mouth daily as needed.      . pravastatin (PRAVACHOL) 40 MG tablet TAKE 1 TABLET BY MOUTH AT BEDTIME  30 tablet  2  . sertraline (ZOLOFT) 50 MG tablet TAKE 1 TABLET (50 MG TOTAL) BY MOUTH DAILY.  30 tablet  5  . topiramate (TOPAMAX) 50 MG tablet Take one tablet twice daily.  60 tablet  5  . TRICOR 145 MG tablet TAKE 1 TABLET BY MOUTH EVERY DAY  30 tablet  11  . DISCONTD: omeprazole (PRILOSEC) 20 MG capsule Take 1 capsule (20 mg total) by mouth daily.         Review of Systems  Constitutional: Negative.   HENT: Negative.        Jaw pain  Eyes: Negative.   Gastrointestinal: Negative.   Musculoskeletal: Negative.   Skin: Negative.   Neurological: Negative.   Hematological: Negative.   All other systems reviewed and are negative.    BP 134/83  Pulse 84  Ht 5\' 4"  (1.626 m)  Wt 164 lb (74.39 kg)  BMI 28.15 kg/m2  Physical Exam  Nursing note and vitals reviewed. Constitutional: She is oriented to person, place, and time. She appears well-developed and well-nourished.  HENT:  Head: Normocephalic.  Nose: Nose normal.  Mouth/Throat: Oropharynx is clear and moist.  Eyes: Conjunctivae are normal. Pupils are equal, round, and reactive to light.  Neck: Normal  range of motion. Neck supple. No JVD present.  Cardiovascular: Normal rate, regular rhythm, S1 normal, S2 normal, normal heart sounds and intact distal pulses.  Exam reveals no gallop and no friction rub.   No murmur heard. Pulmonary/Chest: Effort normal and breath sounds normal. No respiratory distress. She has no wheezes. She has no rales. She exhibits no tenderness.  Abdominal: Soft. Bowel sounds are normal. She exhibits no distension. There is no tenderness.  Musculoskeletal: Normal range of motion. She exhibits no edema and no tenderness.  Lymphadenopathy:    She has no cervical adenopathy.  Neurological: She is alert and oriented to person, place, and time.  Coordination normal.  Skin: Skin is warm and dry. No rash noted. No erythema.  Psychiatric: She has a normal mood and affect. Her behavior is normal. Judgment and thought content normal.         Assessment and Plan

## 2011-09-08 NOTE — Assessment & Plan Note (Signed)
We have given her a lab slip for blood work to do at her convenience.

## 2011-09-08 NOTE — Assessment & Plan Note (Signed)
We have encouraged continued exercise, careful diet management in an effort to lose weight. 

## 2011-09-13 ENCOUNTER — Ambulatory Visit: Payer: PRIVATE HEALTH INSURANCE | Admitting: Cardiovascular Disease

## 2011-10-17 ENCOUNTER — Other Ambulatory Visit: Payer: Self-pay | Admitting: Cardiovascular Disease

## 2011-10-17 LAB — HEMOGLOBIN A1C: Hemoglobin A1C: 6.3 % (ref 4.2–6.3)

## 2011-10-17 LAB — LIPID PANEL
Cholesterol: 151 mg/dL (ref 0–200)
HDL Cholesterol: 44 mg/dL (ref 40–60)
Ldl Cholesterol, Calc: 80 mg/dL (ref 0–100)
Triglycerides: 136 mg/dL (ref 0–200)
VLDL Cholesterol, Calc: 27 mg/dL (ref 5–40)

## 2011-10-17 LAB — HEPATIC FUNCTION PANEL A (ARMC)
Bilirubin,Total: 0.4 mg/dL (ref 0.2–1.0)
Total Protein: 7.1 g/dL (ref 6.4–8.2)

## 2011-10-17 LAB — TSH: Thyroid Stimulating Horm: 2.23 u[IU]/mL

## 2011-10-20 ENCOUNTER — Other Ambulatory Visit: Payer: Self-pay | Admitting: *Deleted

## 2011-10-20 MED ORDER — ERGOCALCIFEROL 1.25 MG (50000 UT) PO CAPS: 50000.0000 [IU] | ORAL_CAPSULE | ORAL | Status: AC

## 2011-10-25 ENCOUNTER — Encounter: Payer: Self-pay | Admitting: Gastroenterology

## 2011-10-25 ENCOUNTER — Encounter: Payer: Self-pay | Admitting: Family Medicine

## 2011-10-25 ENCOUNTER — Ambulatory Visit: Payer: Self-pay | Admitting: Family Medicine

## 2011-10-25 ENCOUNTER — Ambulatory Visit (INDEPENDENT_AMBULATORY_CARE_PROVIDER_SITE_OTHER): Payer: PRIVATE HEALTH INSURANCE | Admitting: Family Medicine

## 2011-10-25 VITALS — BP 110/70 | HR 76 | Temp 98.4°F | Ht 64.0 in | Wt 164.4 lb

## 2011-10-25 DIAGNOSIS — R1031 Right lower quadrant pain: Secondary | ICD-10-CM

## 2011-10-25 DIAGNOSIS — R1032 Left lower quadrant pain: Secondary | ICD-10-CM

## 2011-10-25 LAB — CBC WITH DIFFERENTIAL/PLATELET
Basophils Absolute: 0 10*3/uL (ref 0.0–0.1)
Eosinophils Absolute: 0.3 10*3/uL (ref 0.0–0.7)
Eosinophils Relative: 3.8 % (ref 0.0–5.0)
MCV: 81.6 fl (ref 78.0–100.0)
Monocytes Absolute: 0.5 10*3/uL (ref 0.1–1.0)
Neutrophils Relative %: 61.5 % (ref 43.0–77.0)
Platelets: 258 10*3/uL (ref 150.0–400.0)
WBC: 7.3 10*3/uL (ref 4.5–10.5)

## 2011-10-25 LAB — LIPASE: Lipase: 29 U/L (ref 11.0–59.0)

## 2011-10-25 IMAGING — US ABDOMEN ULTRASOUND
1 series · 17 of 25 positions shown · non-contrast
Comparison: none

REASON FOR EXAM: abd pain
COMMENTS:

[Series 1: abdomen ultrasound · 17 of 73 slices shown]
[im 1/73]
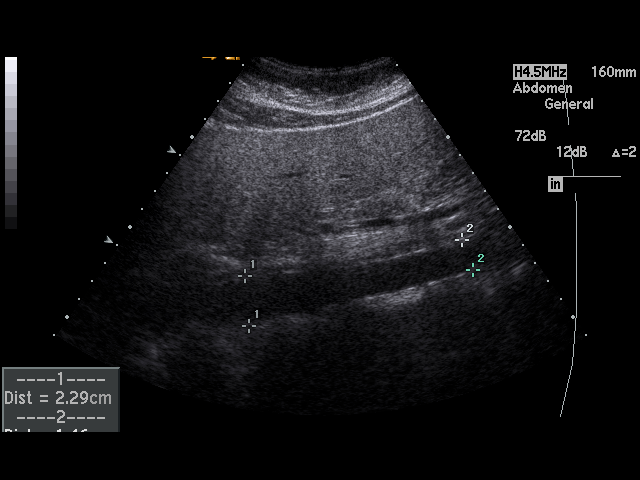
[im 7/73]
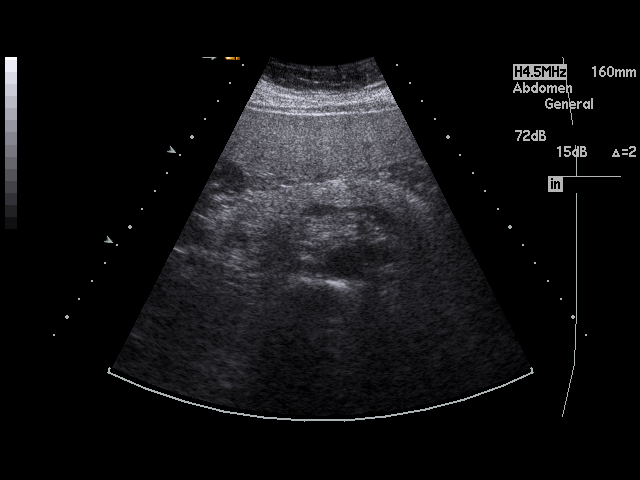
[im 10/73]
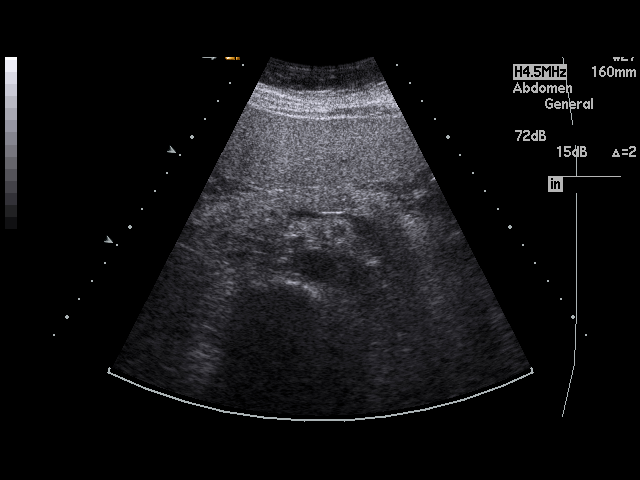
[im 16/73]
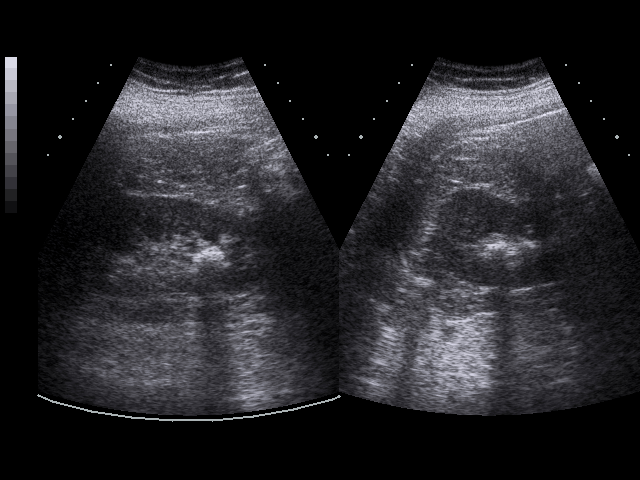
[im 19/73]
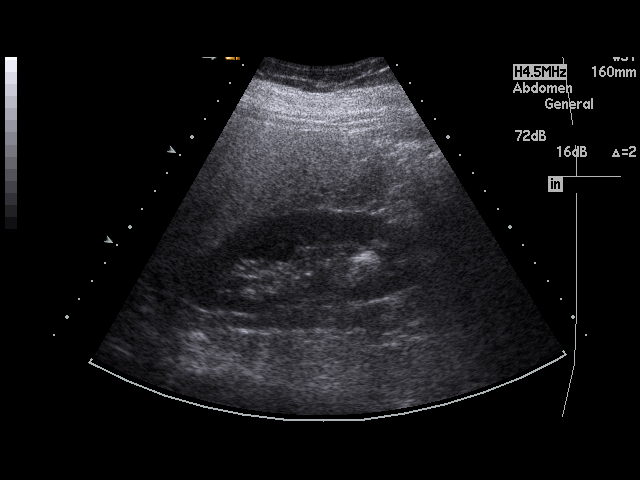
[im 25/73]
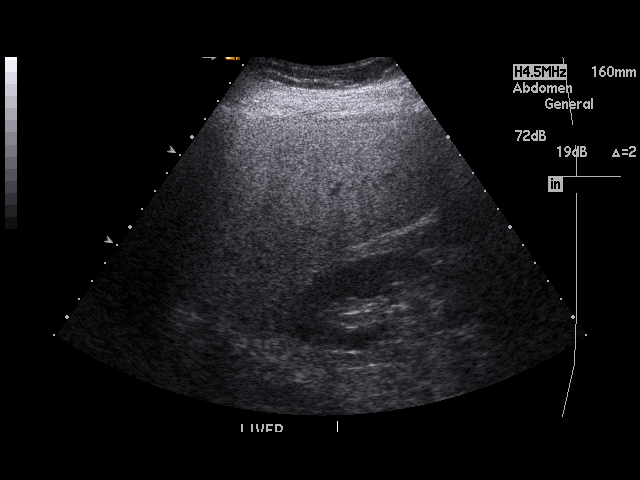
[im 28/73]
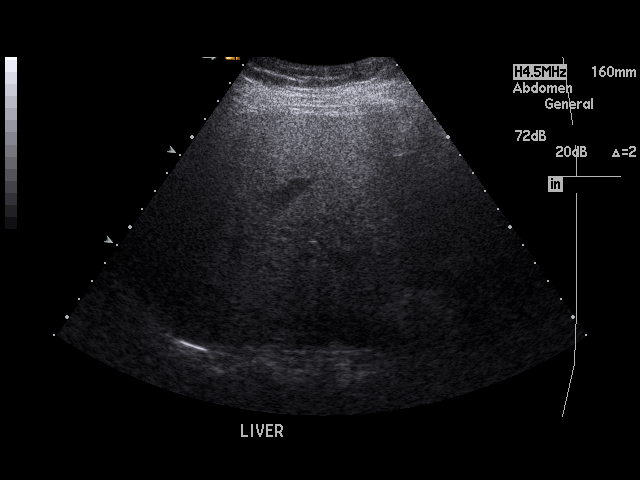
[im 34/73]
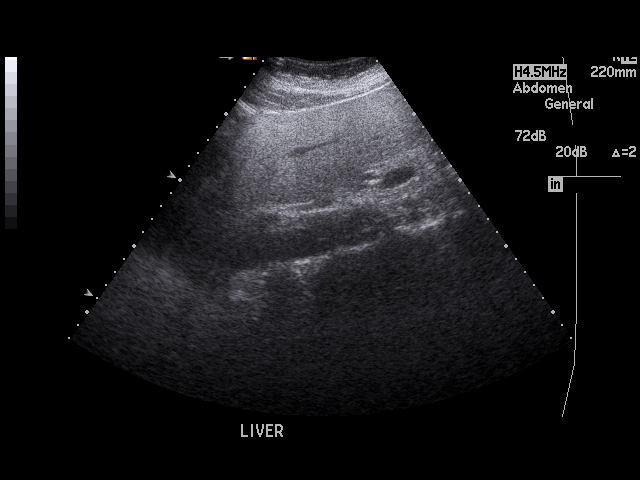
[im 37/73]
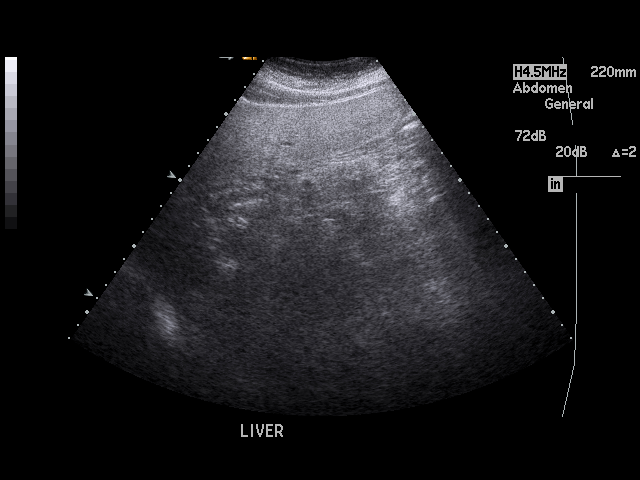
[im 40/73]
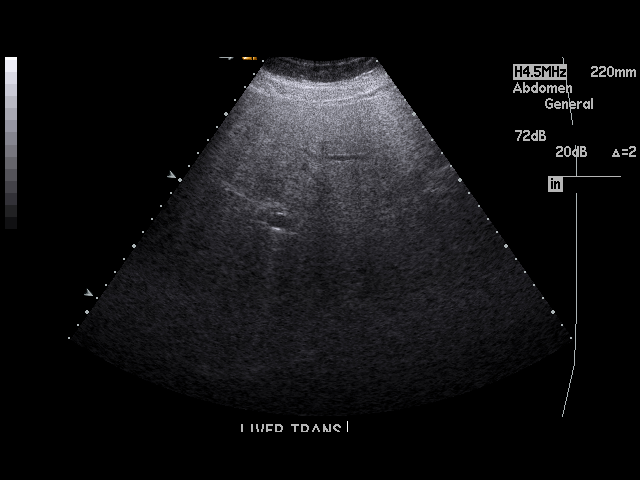
[im 46/73]
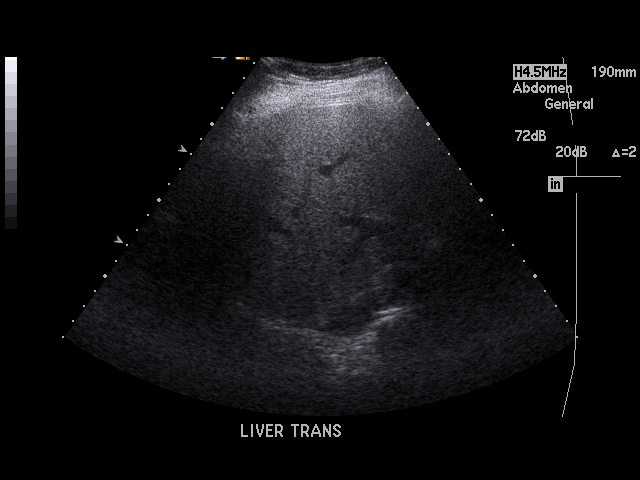
[im 49/73]
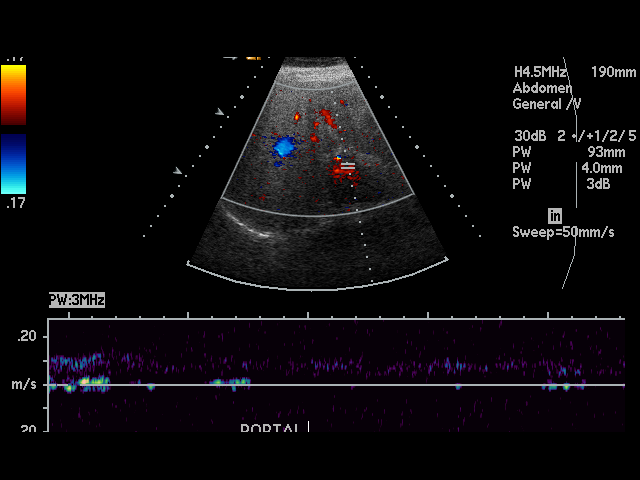
[im 55/73]
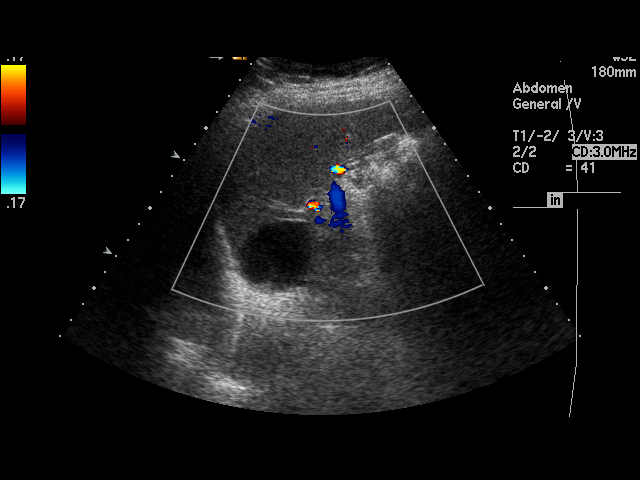
[im 58/73]
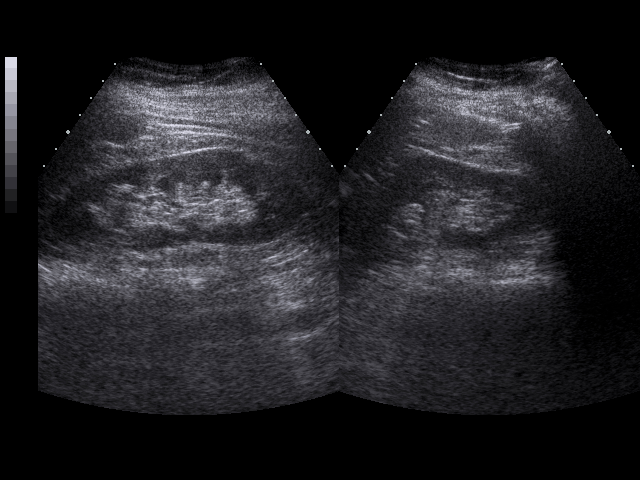
[im 64/73]
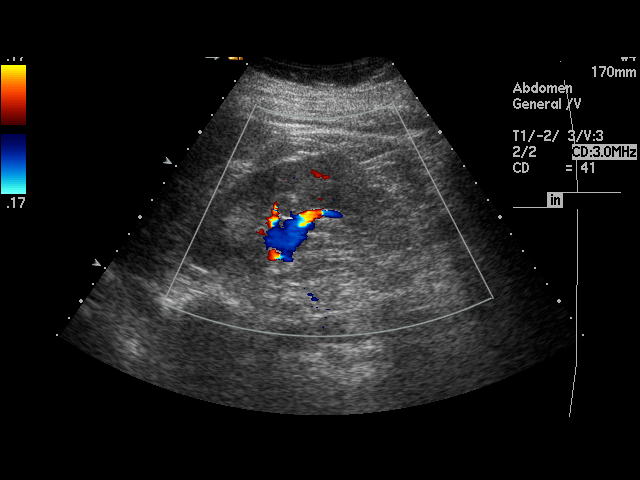
[im 67/73]
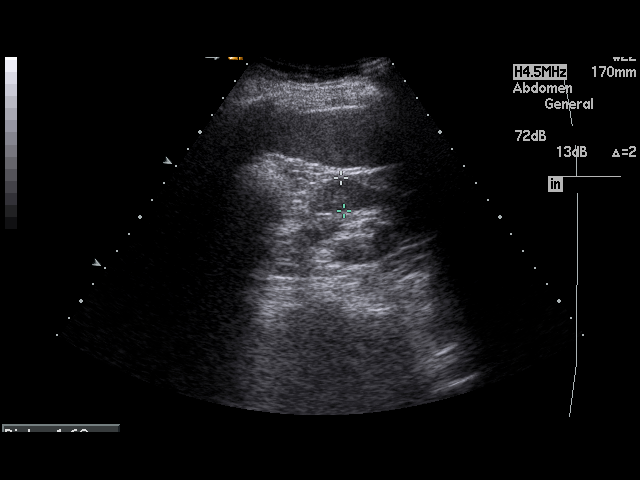
[im 73/73]
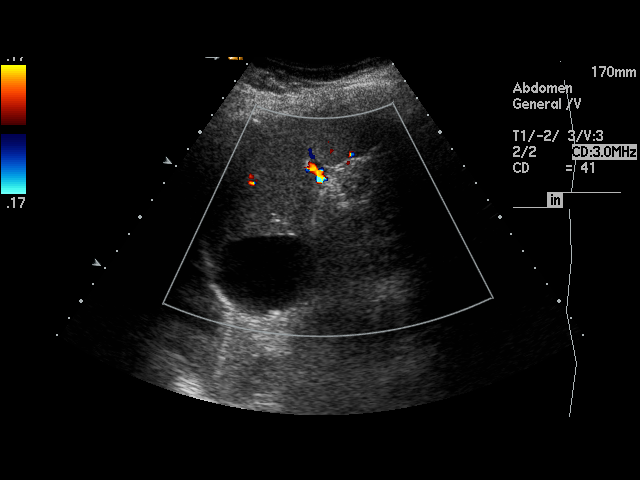

[17 of 25 positions shown; findings below may reference images not displayed]

PROCEDURE:     US  - US ABDOMEN GENERAL SURVEY  - [DATE] [DATE]

RESULT:     Abdominal Sonogram demonstrates the abdominal aorta included on
the study appears to be normal in size. The pancreas is unremarkable. The
right kidney measures 11.62 x 5.88 x 6.04 cm and contains a 1.34 x 1.0 cm,
echogenic shadowing structure in the lower pole region suggestive of a
nonobstructing large stone. Cortical thickness of the right kidney is
cm. The hepatic echotexture shows diffusely increased echogenicity
suggestive of fatty infiltration. Portal venous flow appears normal. The
common bile duct diameter is 2.9 mm. The spleen is normal in size and
contains a 5.32 x 4.36 x 4.84 cm cyst peripherally. This could conceivably
be a cyst from the upper pole of the kidney but the images of the kidney do
not suggest a definite cyst in the upper pole region. The left kidney itself
measures 12.81 x 5.11 x 5.40 cm.
IMPRESSION: 1.  History of laparoscopic cholecystectomy.
2.  Large cyst likely arising from the spleen.
3.  Large lower pole right renal calculus without obstruction.
4.  Increased echogenicity of the hepatic parenchyma consistent with fatty
infiltration.

[REDACTED]

## 2011-10-25 NOTE — Patient Instructions (Signed)
REFERRAL: GO THE THE FRONT ROOM AT THE ENTRANCE OF OUR CLINIC, NEAR CHECK IN. ASK FOR MARION. SHE WILL HELP YOU SET UP YOUR REFERRAL. DATE: TIME:  

## 2011-10-25 NOTE — Progress Notes (Signed)
Patient Name: Brandi Mahoney Date of Birth: 04-18-1956 Medical Record Number: [id number] Gender: female Date of Encounter: 10/25/2011  History of Present Illness:  Brandi Mahoney is a 56 y.o. very pleasant female patient who presents with the following:  Had a GYN check, and when gave a pelvic exam, and had some pain in the abdominal exam and in the pelvic exam. Since then, has been bothering her and nauseous, on and off.   R sided lower abdomen, improved some now compared to her exam before at the gyn, but still significantly painful. She denies blood or black hard stools. No mucus. She is eating and drinking okay. Some intermittent occasional nausea.  Recent LFTs were normal.  She is status post hysterectomy, oophorectomy, appendectomy, and cholecystectomy.  She has had a colonoscopy, this was about 5 years ago or so.   Patient Active Problem List  Diagnoses  . HYPOTHYROIDISM  . DIABETES MELLITUS, TYPE II  . HYPERTRIGLYCERIDEMIA  . HYPERLIPIDEMIA-MIXED  . DEPRESSION  . OTHER ACUTE MYOCARDITIS  . ASTHMA  . OTHER MALAISE AND FATIGUE  . PARESTHESIA, HANDS   Past Medical History  Diagnosis Date  . Asthma   . Depression   . Diabetes mellitus     type II  . Migraine headache   . IBS (irritable bowel syndrome)   . Thyroid disease     hypothyroidism  . Myocarditis 01/21/2010    hospitalized- [facility]   Past Surgical History  Procedure Date  . Appendectomy 1974  . Cholecystectomy 1997  . Abdominal hysterectomy 1998  . Cardiac catheterization 8/11    no significant -[facility]- Dr Mariah Milling,   History  Substance Use Topics  . Smoking status: Former Games developer  . Smokeless tobacco: Not on file   Comment: quit in 2007-2008, social smoker  . Alcohol Use: Yes   Family History  Problem Relation Age of Onset  . Alcohol abuse Neg Hx   . Colon cancer Neg Hx   . Ovarian cancer Neg Hx   . Breast cancer      Grandmother  . Lung cancer      grandfather  . Prostate cancer Neg Hx   .  Coronary artery disease      Parent  . Stroke Neg Hx   . Sudden death Neg Hx   . Diabetes      fam hx  . Mental illness Neg Hx    Allergies  Allergen Reactions  . Pseudoeph-Doxylamine-Dm-Apap     Allergy to nyquil  . Gluten   . Penicillins     rash    Medication list has been reviewed and updated.  Review of Systems:  GEN: No acute illnesses, no fevers, chills. GI: above Interactive and getting along well at home.  Otherwise, ROS is as per the HPI.   Physical Examination: Filed Vitals:   10/25/11 0823  BP: 110/70  Pulse: 76  Temp: 98.4 F (36.9 C)  TempSrc: Oral  Height: 5\' 4"  (1.626 m)  Weight: 164 lb 6.4 oz (74.571 kg)  SpO2: 95%    Body mass index is 28.22 kg/(m^2).   GEN: WDWN, NAD, Non-toxic, A & O x 3 HEENT: Atraumatic, Normocephalic. Neck supple. No masses, No LAD. Ears and Nose: No external deformity. CV: RRR, No M/G/R. No JVD. No thrill. No extra heart sounds. PULM: CTA B, no wheezes, crackles, rhonchi. No retractions. No resp. distress. No accessory muscle use. ABD: Soft, mildly tender. More tender in the right and left lower quad. No rebound tenderness. No hepatosplenomegaly.  EXTR: No c/c/e NEURO Normal gait.  PSYCH: Normally interactive. Conversant. Not depressed or anxious appearing.  Calm demeanor.    Assessment and Plan:  1. Right lower quadrant abdominal pain  US Abdomen Complete, CBC with Differential, Lipase, Ambulatory referral to Gastroenterology  2. Abdominal pain, left lower quadrant  US Abdomen Complete, CBC with Differential, Lipase, Ambulatory referral to Gastroenterology   Abdominal pain of unclear source. Obtain an abdominal ultrasound to evaluate for potential mass, evaluate liver, evaluate spleen. Also evaluate potential colon Abnormality.  Consult GI, patient may need endoscopic evaluation  Orders Today: Orders Placed This Encounter  Procedures  . US Abdomen Complete    Standing Status: Future     Number of Occurrences:       Standing Expiration Date: 12/24/2012    Order Specific Question:  Reason for exam:    Answer:  [facility], abd pain, more in LLQ and RLQ    Order Specific Question:  Preferred imaging location?    Answer:  External  . CBC with Differential  . Lipase  . Ambulatory referral to Gastroenterology    Referral Priority:  Routine    Referral Type:  Consultation    Referral Reason:  Specialty Services Required    Requested Specialty:  Gastroenterology    Number of Visits Requested:  1    Medications Today: No orders of the defined types were placed in this encounter.

## 2011-11-01 ENCOUNTER — Ambulatory Visit (INDEPENDENT_AMBULATORY_CARE_PROVIDER_SITE_OTHER): Payer: PRIVATE HEALTH INSURANCE | Admitting: Gastroenterology

## 2011-11-01 VITALS — BP 100/70 | HR 68 | Ht 64.0 in | Wt 164.4 lb

## 2011-11-01 DIAGNOSIS — Z1211 Encounter for screening for malignant neoplasm of colon: Secondary | ICD-10-CM

## 2011-11-01 DIAGNOSIS — K5900 Constipation, unspecified: Secondary | ICD-10-CM

## 2011-11-01 DIAGNOSIS — R1084 Generalized abdominal pain: Secondary | ICD-10-CM

## 2011-11-01 MED ORDER — MOVIPREP 100 G PO SOLR: 1.0000 | Freq: Once | ORAL | Status: DC

## 2011-11-01 NOTE — Patient Instructions (Signed)
You have been scheduled for a colonoscopy with propofol. Please follow written instructions given to you at your visit today.  Please pick up your prep kit at the pharmacy within the next 1-3 days. Start Miralax over the coutner mixing 17 grams in 8 oz of water 3-4 x daily for next several days and then reduce to 1-2 x daily for a long-term treatment for constipation. cc: Hannah Beat, MD

## 2011-11-01 NOTE — Progress Notes (Signed)
History of Present Illness: This is a 56 year old female here today with her husband and son. She relates a generalized abdominal pain and constipation for the past several months. She has a difficult time evacuating stools and increased pain with bowel movements. She has a history of irritable bowel syndrome and has to urgent diarrhea. She underwent colonoscopy in May 2001 in [place], [place] which showed internal hemorrhoids and no other abnormalities. Recent abdominal ultrasound showed fatty liver, a right kidney stones and a splenic cyst. Denies weight loss, diarrhea, change in stool caliber, melena, hematochezia, nausea, vomiting, dysphagia, reflux symptoms, chest pain.  Review of Systems: Pertinent positive and negative review of systems were noted in the above HPI section. All other review of systems were otherwise negative.  Current Medications, Allergies, Past Medical History, Past Surgical History, Family History and Social History were reviewed in [employer] record.  Physical Exam: General: Well developed , well nourished, no acute distress Head: Normocephalic and atraumatic Eyes:  sclerae anicteric, EOMI Ears: Normal auditory acuity Mouth: No deformity or lesions Neck: Supple, no masses or thyromegaly Lungs: Clear throughout to auscultation Heart: Regular rate and rhythm; no murmurs, rubs or bruits Abdomen: Soft, mild generalized tenderness to deep palpation without rebound or guarding and non distended. No masses, hepatosplenomegaly or hernias noted. Normal Bowel sounds Rectal: Deferred to colonoscopy Musculoskeletal: Symmetrical with no gross deformities  Skin: No lesions on visible extremities Pulses:  Normal pulses noted Extremities: No clubbing, cyanosis, edema or deformities noted Neurological: Alert oriented x 4, grossly nonfocal Cervical Nodes:  No significant cervical adenopathy Inguinal Nodes: No significant inguinal  adenopathy Psychological:  Alert and cooperative. Normal mood and affect  Assessment and Recommendations:  1. Generalized abdominal pain, constipation. I suspect her pain is related to constipation and/or irritable bowel syndrome. Increase dietary fiber and water intake. Begin MiraLax once or twice daily on a regular schedule.  2. Colorectal cancer screening, average risk. The risks, benefits, and alternatives to colonoscopy with possible biopsy and possible polypectomy were discussed with the patient and they consent to proceed.   2. Fatty infiltration of liver. Long-term control of lipids and glucose. Long-term low-fat diet and weight loss program supervised by her primary physician.

## 2011-11-14 ENCOUNTER — Ambulatory Visit: Payer: PRIVATE HEALTH INSURANCE | Admitting: Gastroenterology

## 2011-11-15 ENCOUNTER — Encounter: Payer: Self-pay | Admitting: Gastroenterology

## 2011-11-15 ENCOUNTER — Ambulatory Visit (AMBULATORY_SURGERY_CENTER): Payer: PRIVATE HEALTH INSURANCE | Admitting: Gastroenterology

## 2011-11-15 VITALS — BP 129/84 | HR 85 | Temp 95.8°F | Resp 20 | Ht 64.0 in | Wt 164.0 lb

## 2011-11-15 DIAGNOSIS — D126 Benign neoplasm of colon, unspecified: Secondary | ICD-10-CM

## 2011-11-15 DIAGNOSIS — Z1211 Encounter for screening for malignant neoplasm of colon: Secondary | ICD-10-CM

## 2011-11-15 MED ORDER — SODIUM CHLORIDE 0.9 % IV SOLN: 500.0000 mL | INTRAVENOUS | Status: DC

## 2011-11-15 NOTE — Op Note (Signed)
[facility] [address] [place], [place]  [address]  COLONOSCOPY PROCEDURE REPORT  PATIENT:  Brandi Mahoney, Brandi Mahoney  MR#:  [id number] BIRTHDATE:  [birth year], 55 yrs. old  GENDER:  female ENDOSCOPIST:  Judie Petit T. Russella Dar, MD, [facility] Referred by:  Elpidio Galea. Copland, M.D. PROCEDURE DATE:  11/15/2011 PROCEDURE:  Colonoscopy with biopsy ASA CLASS:  Class II INDICATIONS:  1) Routine Risk Screening MEDICATIONS:   MAC sedation, administered by CRNA, propofol (Diprivan) 200 mg IV DESCRIPTION OF PROCEDURE:   After the risks benefits and alternatives of the procedure were thoroughly explained, informed consent was obtained.  Digital rectal exam was performed and revealed no abnormalities.   The LB [id number] endoscope was introduced through the anus and advanced to the cecum, which was identified by both the appendix and ileocecal valve, without limitations.  The quality of the prep was excellent, using MoviPrep.  The instrument was then slowly withdrawn as the colon was fully examined. <<PROCEDUREIMAGES>> FINDINGS:  Two polyps were found in the mid transverse colon. They were 4 mm in size. The polyps were removed using cold biopsy forceps.  Otherwise normal colonoscopy without other polyps, masses, vascular ectasias, or inflammatory changes.   Retroflexed views in the rectum revealed internal hemorrhoids, small.  The time to cecum =  2.33  minutes. The scope was then withdrawn (time =  11  min) from the patient and the procedure completed.  COMPLICATIONS:  None  ENDOSCOPIC IMPRESSION: 1) 4 mm, two polyps in the mid transverse colon 2) Internal hemorrhoids  RECOMMENDATIONS: 1) Await pathology results 2) Repeat Colonoscopy in 5 years if polyp(s) adenomatous, otherwise 10 years.  Venita Lick. Russella Dar, MD, [facility]  n. eSIGNED:   Venita Lick. Demontrez Rindfleisch at 11/15/2011 09:40 AM  [place], Bonita Quin, [phone]

## 2011-11-15 NOTE — Patient Instructions (Signed)
YOU HAD AN ENDOSCOPIC PROCEDURE TODAY AT THE [facility]: Refer to the procedure report that was given to you for any specific questions about what was found during the examination.  If the procedure report does not answer your questions, please call your gastroenterologist to clarify.  If you requested that your care partner not be given the details of your procedure findings, then the procedure report has been included in a sealed envelope for you to review at your convenience later.  YOU SHOULD EXPECT: Some feelings of bloating in the abdomen. Passage of more gas than usual.  Walking can help get rid of the air that was put into your GI tract during the procedure and reduce the bloating. If you had a lower endoscopy (such as a colonoscopy or flexible sigmoidoscopy) you may notice spotting of blood in your stool or on the toilet paper. If you underwent a bowel prep for your procedure, then you may not have a normal bowel movement for a few days.  DIET: Your first meal following the procedure should be a light meal and then it is ok to progress to your normal diet.  A half-sandwich or bowl of soup is an example of a good first meal.  Heavy or fried foods are harder to digest and may make you feel nauseous or bloated.  Likewise meals heavy in dairy and vegetables can cause extra gas to form and this can also increase the bloating.  Drink plenty of fluids but you should avoid alcoholic beverages for 24 hours.  ACTIVITY: Your care partner should take you home directly after the procedure.  You should plan to take it easy, moving slowly for the rest of the day.  You can resume normal activity the day after the procedure however you should NOT DRIVE or use heavy machinery for 24 hours (because of the sedation medicines used during the test).    SYMPTOMS TO REPORT IMMEDIATELY: A gastroenterologist can be reached at any hour.  During normal business hours, 8:30 AM to 5:00 PM Monday through Friday,  call [phone].  After hours and on weekends, please call the GI answering service at [phone] who will take a message and have the physician on call contact you.   Following lower endoscopy (colonoscopy or flexible sigmoidoscopy):  Excessive amounts of blood in the stool  Significant tenderness or worsening of abdominal pains  Swelling of the abdomen that is new, acute  Fever of 100F or higher    FOLLOW UP: If any biopsies were taken you will be contacted by phone or by letter within the next 1-3 weeks.  Call your gastroenterologist if you have not heard about the biopsies in 3 weeks.  Our staff will call the home number listed on your records the next business day following your procedure to check on you and address any questions or concerns that you may have at that time regarding the information given to you following your procedure. This is a courtesy call and so if there is no answer at the home number and we have not heard from you through the emergency physician on call, we will assume that you have returned to your regular daily activities without incident.  SIGNATURES/CONFIDENTIALITY: You and/or your care partner have signed paperwork which will be entered into your electronic medical record.  These signatures attest to the fact that that the information above on your After Visit Summary has been reviewed and is understood.  Full responsibility of the confidentiality   of this discharge information lies with you and/or your care-partner.     

## 2011-11-15 NOTE — Progress Notes (Signed)
Patient did not have preoperative order for IV antibiotic SSI prophylaxis. (G8918)  Patient did not experience any of the following events: a burn prior to discharge; a fall within the facility; wrong site/side/patient/procedure/implant event; or a hospital transfer or hospital admission upon discharge from the facility. (G8907)  

## 2011-11-16 ENCOUNTER — Telehealth: Payer: Self-pay | Admitting: *Deleted

## 2011-11-16 NOTE — Telephone Encounter (Signed)
  Follow up Call-  Call back number 11/15/2011  Post procedure Call Back phone  # [phone]  Permission to leave phone message Yes     Patient questions:  Do you have a fever, pain , or abdominal swelling? no Pain Score  0 *  Have you tolerated food without any problems? yes  Have you been able to return to your normal activities? yes  Do you have any questions about your discharge instructions: Diet   no Medications  no Follow up visit  no  Do you have questions or concerns about your Care? no  Actions: * If pain score is 4 or above: No action needed, pain <4.

## 2011-11-19 ENCOUNTER — Other Ambulatory Visit: Payer: Self-pay | Admitting: Family Medicine

## 2011-11-20 ENCOUNTER — Encounter: Payer: Self-pay | Admitting: Gastroenterology

## 2011-11-27 ENCOUNTER — Emergency Department: Payer: Self-pay | Admitting: Emergency Medicine

## 2011-11-27 ENCOUNTER — Telehealth: Payer: Self-pay | Admitting: Family Medicine

## 2011-11-27 LAB — URINALYSIS, COMPLETE
Bilirubin,UR: NEGATIVE
Glucose,UR: NEGATIVE mg/dL (ref 0–75)
Nitrite: NEGATIVE
Protein: NEGATIVE
RBC,UR: 10 /HPF (ref 0–5)
Specific Gravity: 1.016 (ref 1.003–1.030)
WBC UR: 16 /HPF (ref 0–5)

## 2011-11-27 LAB — CBC
HGB: 11.7 g/dL — ABNORMAL LOW (ref 12.0–16.0)
MCH: 26.5 pg (ref 26.0–34.0)
MCHC: 32.1 g/dL (ref 32.0–36.0)
MCV: 83 fL (ref 80–100)
RBC: 4.41 10*6/uL (ref 3.80–5.20)
RDW: 13.5 % (ref 11.5–14.5)
WBC: 8 10*3/uL (ref 3.6–11.0)

## 2011-11-27 LAB — COMPREHENSIVE METABOLIC PANEL
Alkaline Phosphatase: 79 U/L (ref 50–136)
Anion Gap: 8 (ref 7–16)
BUN: 9 mg/dL (ref 7–18)
Bilirubin,Total: 0.4 mg/dL (ref 0.2–1.0)
Calcium, Total: 8.9 mg/dL (ref 8.5–10.1)
Chloride: 108 mmol/L — ABNORMAL HIGH (ref 98–107)
Co2: 27 mmol/L (ref 21–32)
Creatinine: 0.61 mg/dL (ref 0.60–1.30)
Glucose: 98 mg/dL (ref 65–99)
Osmolality: 284 (ref 275–301)
Potassium: 3.7 mmol/L (ref 3.5–5.1)
SGOT(AST): 34 U/L (ref 15–37)

## 2011-11-27 IMAGING — CT CT ABD-PELV W/ CM
1 of 3 series · 12 of 32 positions shown, 18 images · non-contrast
Comparison: none

REASON FOR EXAM: (1) VERNE>VERNE  tenderness; (2) same
COMMENTS:

PROCEDURE:     CT  - CT ABDOMEN / PELVIS  W  - [DATE] [DATE]
RESULT:
TECHNIQUE: CT of the abdomen and pelvis is performed with 85 ml of
[VK] iodinated intravenous contrast and oral contrast with images
reconstructed in the axial plane at 3.0 mm slice thickness.
There is no previous similar study for comparison.

[Series 2: 3mm soft tissue · axial · 0.77mm/px · z∈[-614,-216]mm · 12 of 159 slices shown, 18 images]
[im 13/159  soft-tissue]
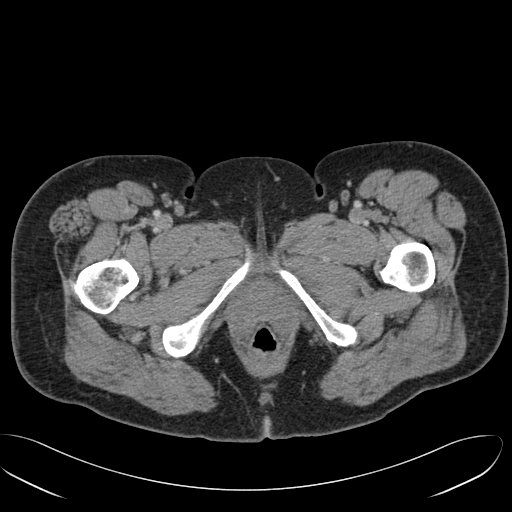
[im 13/159  bone]
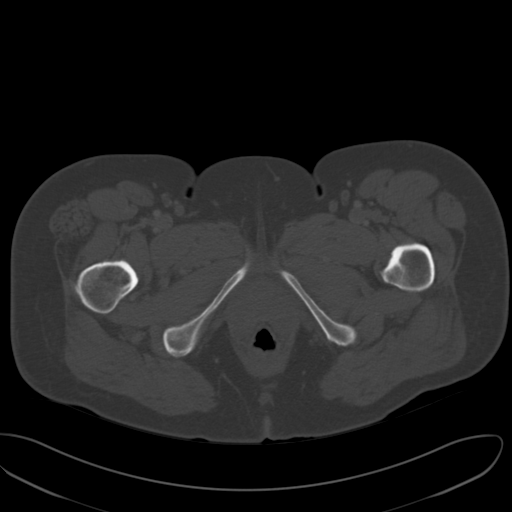
[im 25/159  soft-tissue]
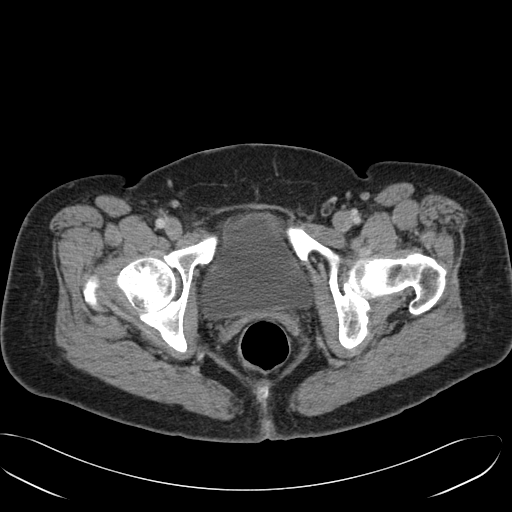
[im 37/159  soft-tissue]
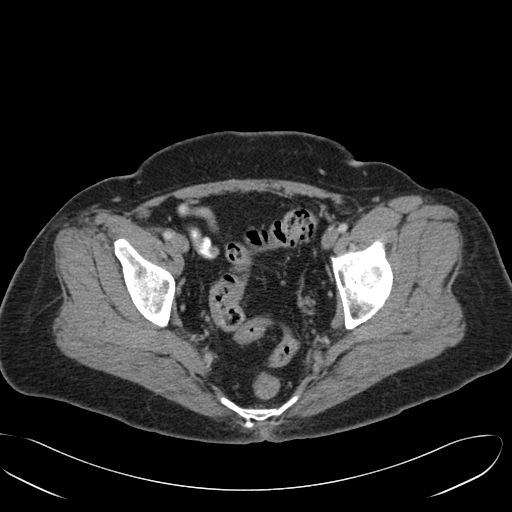
[im 49/159  soft-tissue]
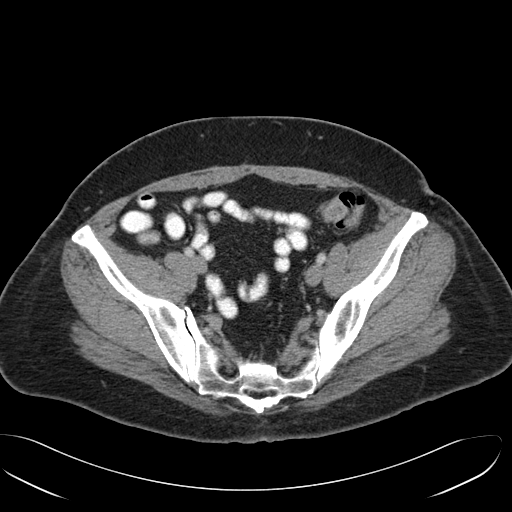
[im 61/159  soft-tissue]
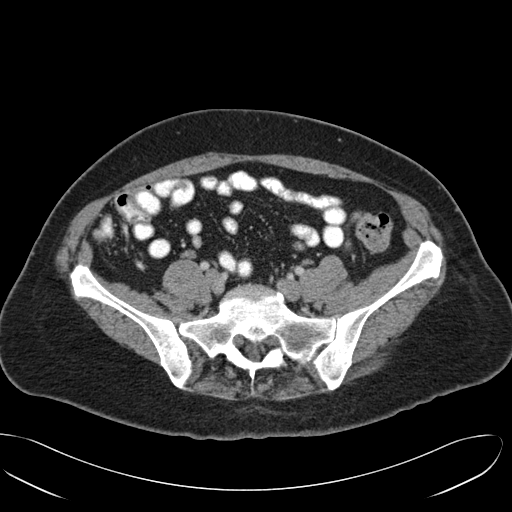
[im 73/159  soft-tissue]
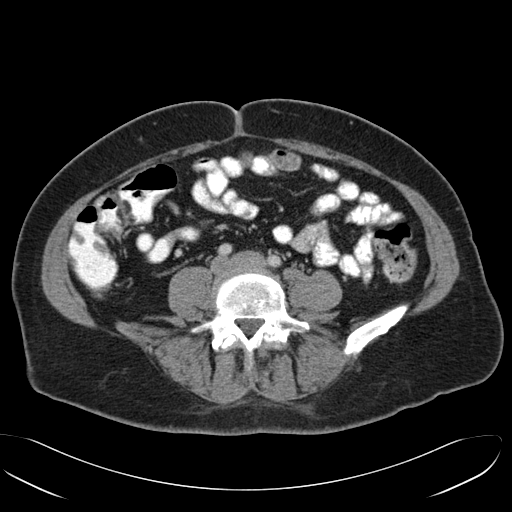
[im 86/159  soft-tissue]
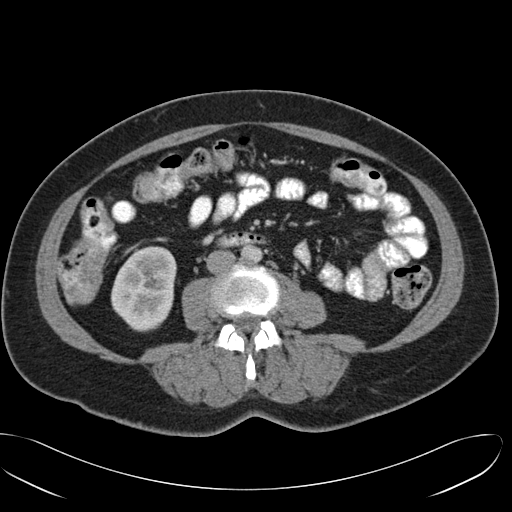
[im 98/159  soft-tissue]
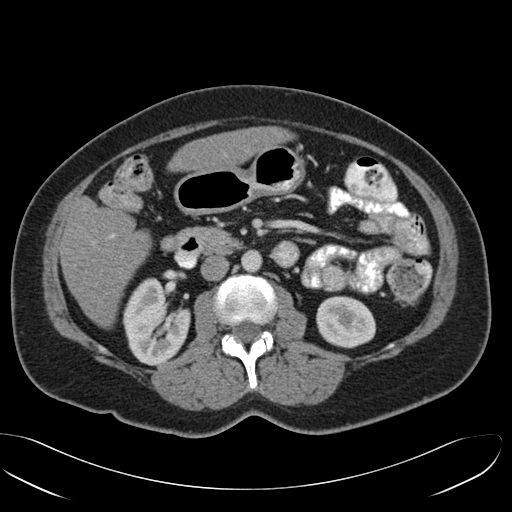
[im 110/159  soft-tissue]
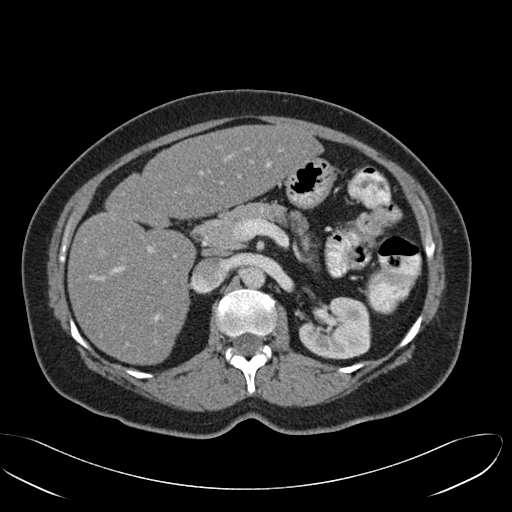
[im 110/159  lung]
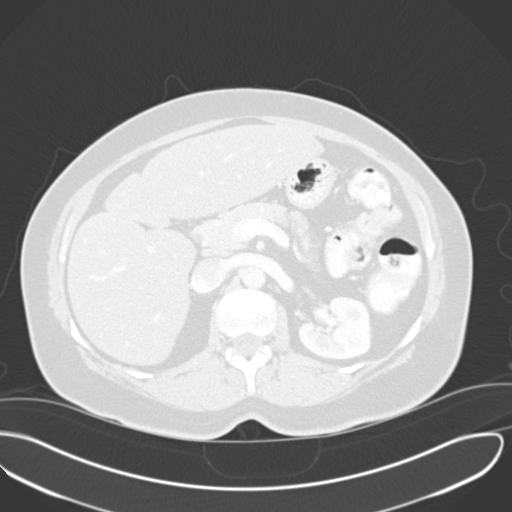
[im 110/159  bone]
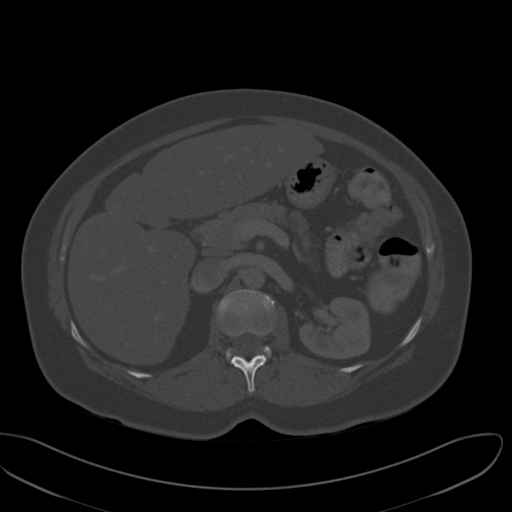
[im 122/159  soft-tissue]
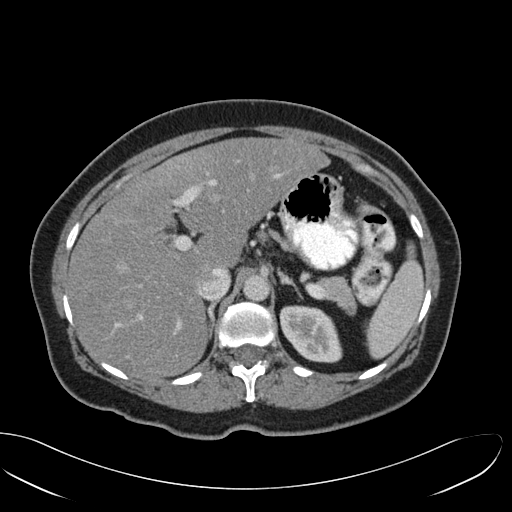
[im 122/159  lung]
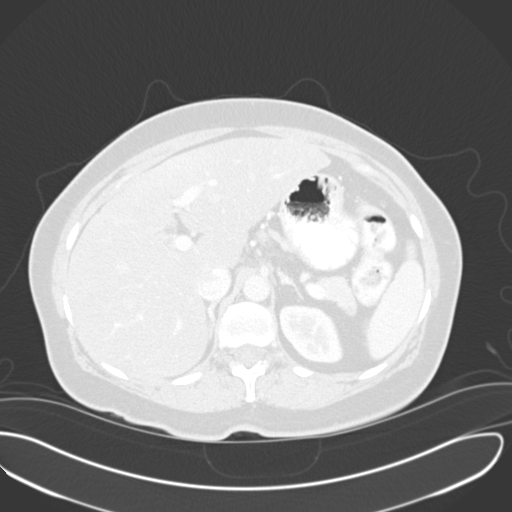
[im 134/159  soft-tissue]
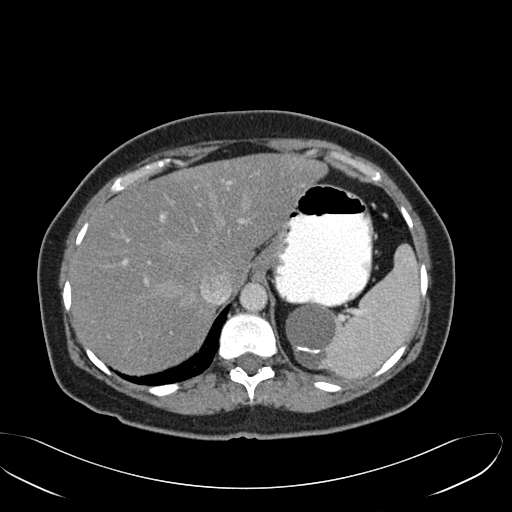
[im 134/159  lung]
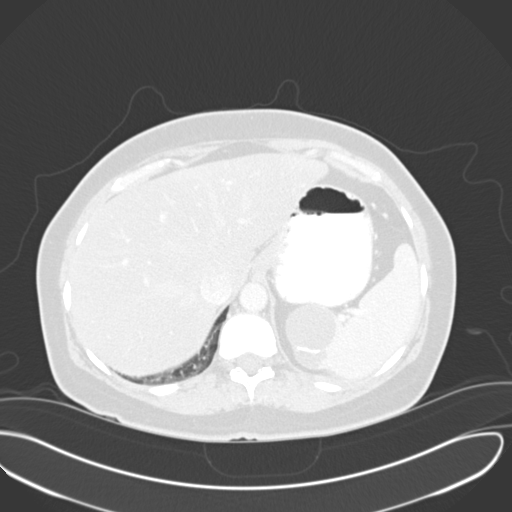
[im 146/159  soft-tissue]
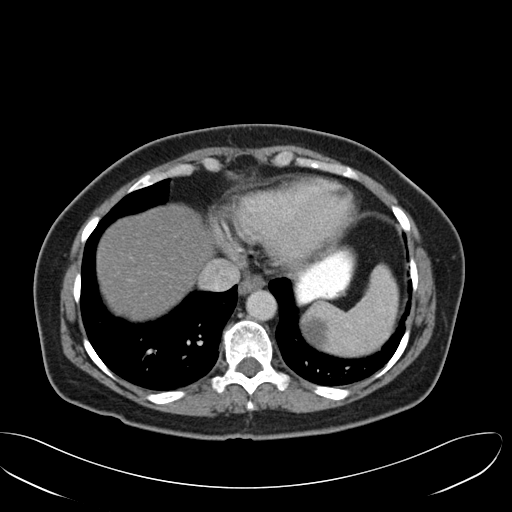
[im 146/159  lung]
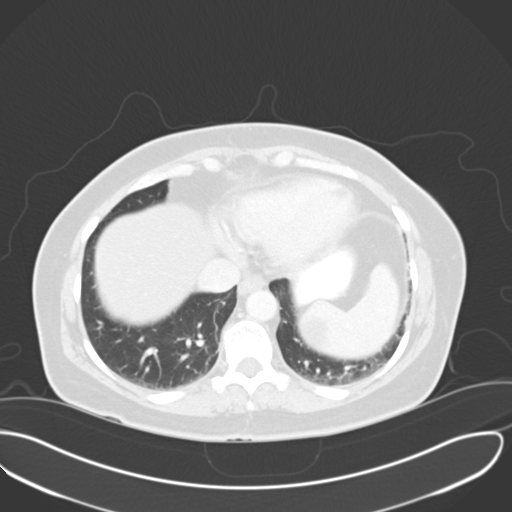

[12 of 32 positions shown; findings below may reference images not displayed]

FINDINGS: Images through the base of the lungs demonstrate some dependent
atelectasis without a discrete pulmonary mass. There is no effusion or
pneumothorax.

There is diffuse fatty replacement in the hepatic parenchyma. There are low
attenuation, nonenhancing areas in the spleen including one with some
peripheral calcification including one medially with some peripheral
calcification posteriorly and inferiorly showing a superior to inferior
dimension of 5.43 cm. Additional smaller cystic areas are seen in the
spleen. These areas do not appear to represent hemangiomas on post contrast
delayed images. Scattered colonic diverticulosis is present. The urinary
bladder contains a moderate amount of urine. The kidneys show no obstructive
change or focal mass. No nephrolithiasis is evident. The pancreas appears
unremarkable. Cholecystectomy clips are present. The adrenal glands are
normal. The aorta is normal in caliber. There is no retroperitoneal or
mesenteric mass or adenopathy. The bony structures appear unremarkable.
Stone density is seen in the lower pole collecting system of the right
kidney measuring up to 9.5 mm at maximal diameter. Nonobstructing, upper
pole, left renal calculus of 2 mm is seen on image 43. No ureteral calculi
are evident. There is no evidence of an acute inflammatory process. The
appendix is not definitely identified. No abnormal bowel distention or wall
thickening is evident. The abdominal wall appears intact.
IMPRESSION: 1.  Bilateral nephrolithiasis.
2.  Atypical cyst with some peripheral calcification medially in the spleen
with smaller splenic cysts present.

[REDACTED]

## 2011-11-27 NOTE — Telephone Encounter (Signed)
Spoke to patient and was advised that she is still having abdominal pain at a level of 5. Patient states that she is at work and can not leave before 4:30. Patient stated that she will go to [facility] when she leaves work.

## 2011-11-27 NOTE — Telephone Encounter (Signed)
ok 

## 2011-11-27 NOTE — Telephone Encounter (Signed)
Appropriately triaged, please call patient - should be evaluated today

## 2011-11-27 NOTE — Telephone Encounter (Signed)
Caller: Brandi Mahoney/Patient; PCP: Juleen China.; CB#: [phone];  Call regarding Abdominal Pain; sx started 5-6 weeks ago; 2 weeks ago U/S was given and found fatty liver and small kidney stone;  now she is having pain in rt lower abdomenal area and groin area; pain is in the rt lower back as well; this pain started to get worse since last week approx 11/20/11; pain comes and goes; takes Motrin for the pain and does help but havingto increase the amount of Motrin she is taking;  taking up to Motrin 800mg  as needed; no fever; no vomiting; having intermittent diarrhea; nauseated; last seen in the office 2 weeks ago; sx seem like they are getting worse since last week; rates 6-7/10; described as deep; Triaged per Abdominal Pain Guideline; See in ED Immed d/t pain is described as deep; pt states that she can not leave work; refused ov appt since she can't leave work; states that she will go to ER after work [employer]; re instructed about seeing in ED within 1 hr; voices understanding

## 2011-11-29 LAB — URINE CULTURE

## 2011-12-04 ENCOUNTER — Ambulatory Visit: Payer: PRIVATE HEALTH INSURANCE | Admitting: Family Medicine

## 2012-01-26 ENCOUNTER — Ambulatory Visit (INDEPENDENT_AMBULATORY_CARE_PROVIDER_SITE_OTHER): Payer: PRIVATE HEALTH INSURANCE | Admitting: Family Medicine

## 2012-01-26 ENCOUNTER — Ambulatory Visit: Payer: Self-pay | Admitting: Family Medicine

## 2012-01-26 ENCOUNTER — Other Ambulatory Visit: Payer: Self-pay | Admitting: Family Medicine

## 2012-01-26 ENCOUNTER — Encounter: Payer: Self-pay | Admitting: Family Medicine

## 2012-01-26 VITALS — BP 124/78 | HR 88 | Temp 98.5°F | Wt 170.0 lb

## 2012-01-26 DIAGNOSIS — M2390 Unspecified internal derangement of unspecified knee: Secondary | ICD-10-CM

## 2012-01-26 DIAGNOSIS — M2392 Unspecified internal derangement of left knee: Secondary | ICD-10-CM

## 2012-01-26 DIAGNOSIS — F0781 Postconcussional syndrome: Secondary | ICD-10-CM

## 2012-01-26 IMAGING — CT CT HEAD WITHOUT CONTRAST
2 series · 16 of 30 positions shown, 20 images · non-contrast
Comparison: none

REASON FOR EXAM: CR [PHONE_NUMBER] post concussion syndrome
COMMENTS:

[Series 2: without · axial · non-contrast · 0.42mm/px · z∈[+178,+303]mm · 13 of 31 slices shown, 17 images]
[im 3/31  brain]
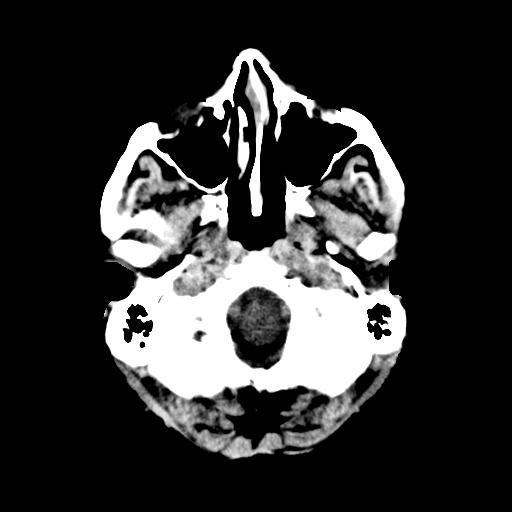
[im 3/31  bone]
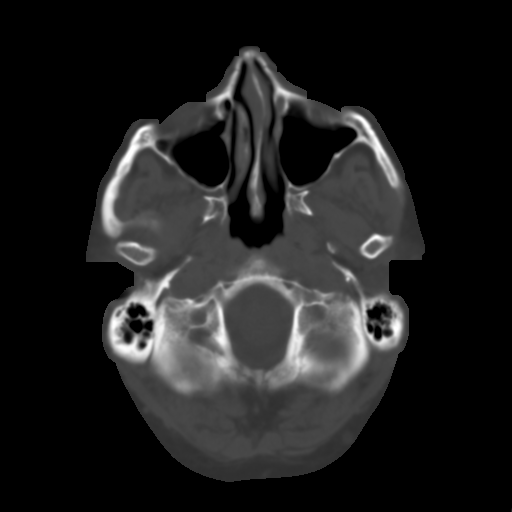
[im 5/31  brain]
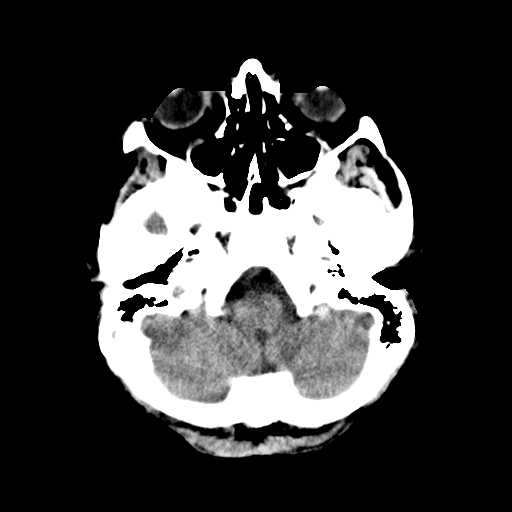
[im 7/31  brain]
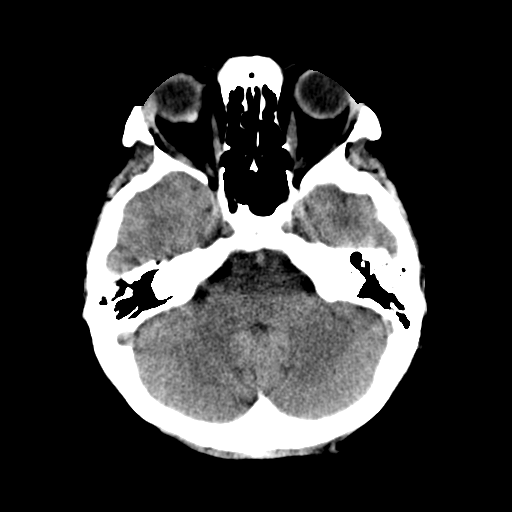
[im 9/31  brain]
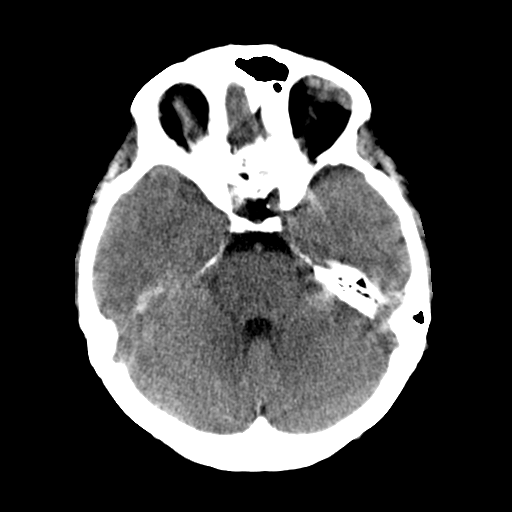
[im 11/31  brain]
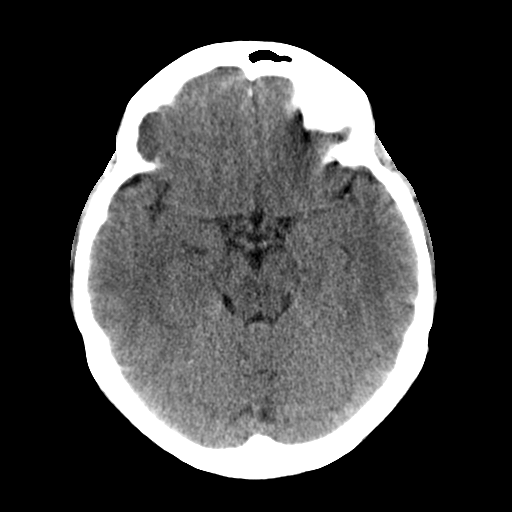
[im 11/31  bone]
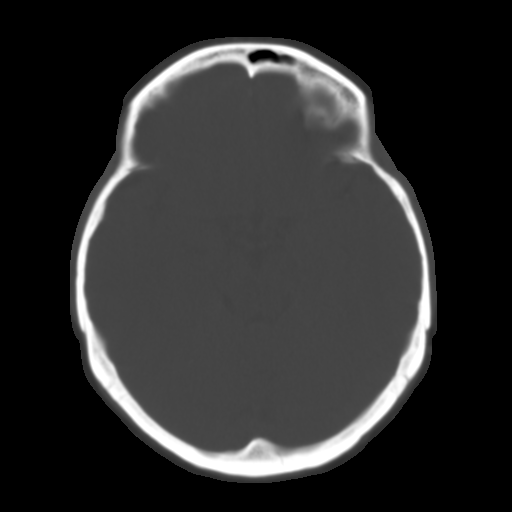
[im 13/31  brain]
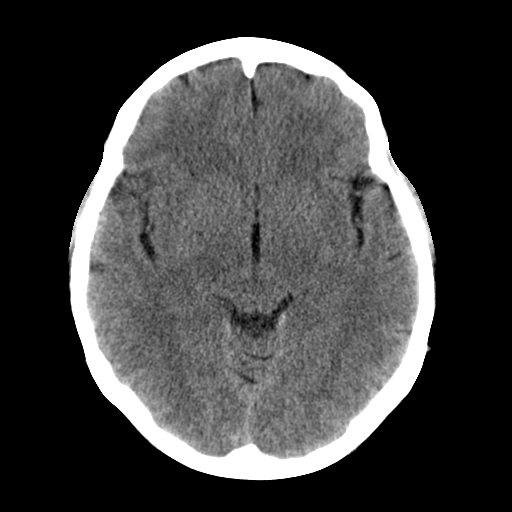
[im 16/31  brain]
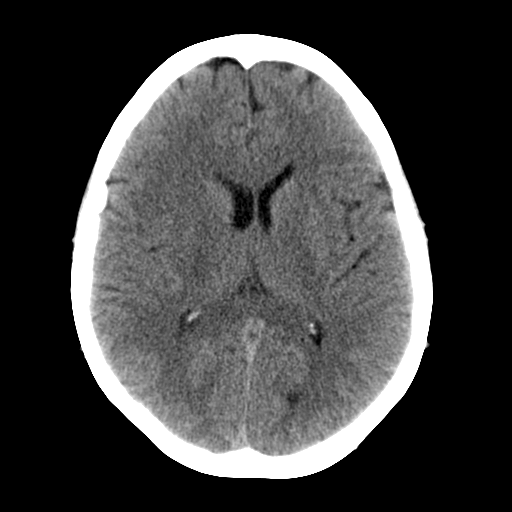
[im 18/31  brain]
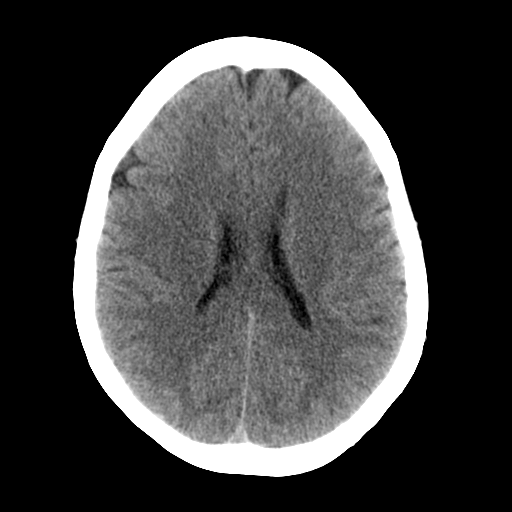
[im 20/31  brain]
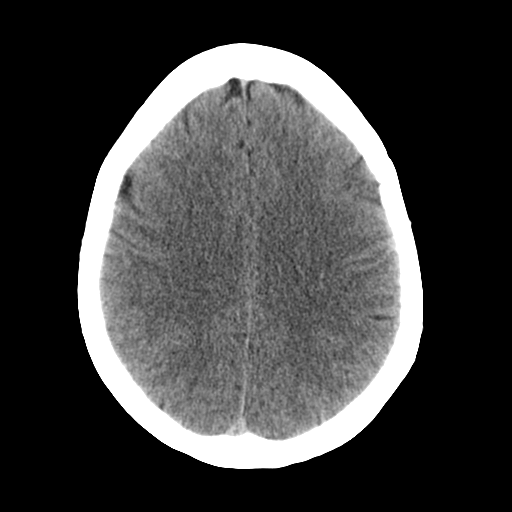
[im 20/31  bone]
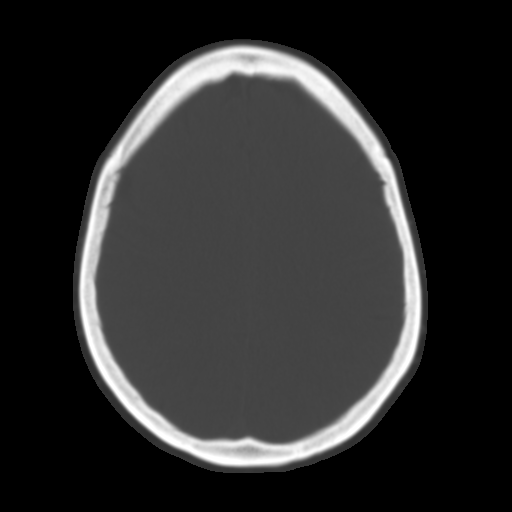
[im 22/31  brain]
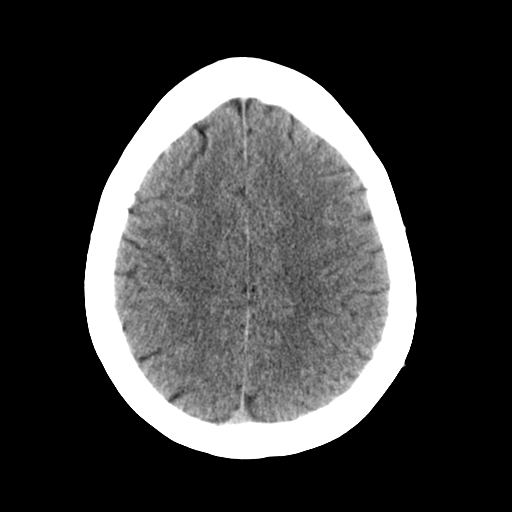
[im 24/31  brain]
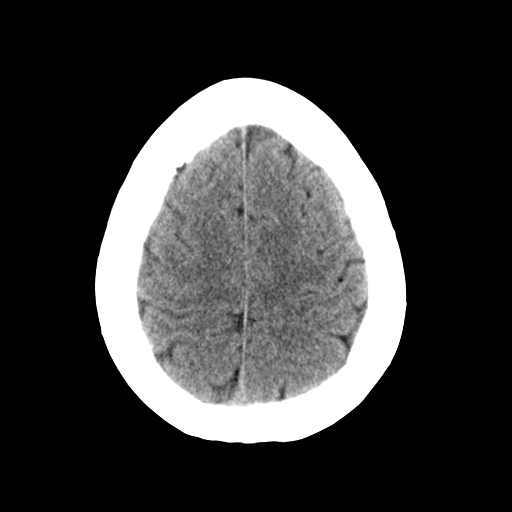
[im 26/31  brain]
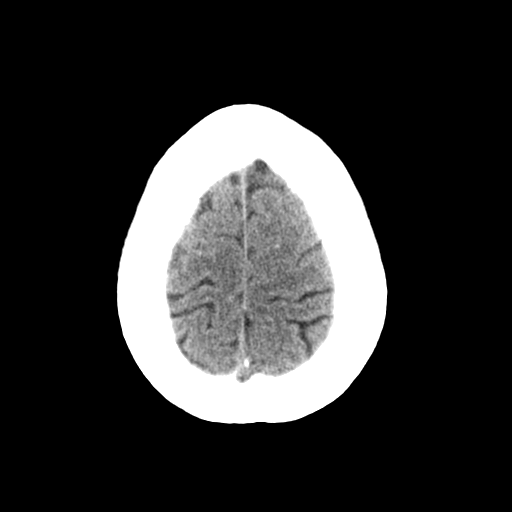
[im 28/31  brain]
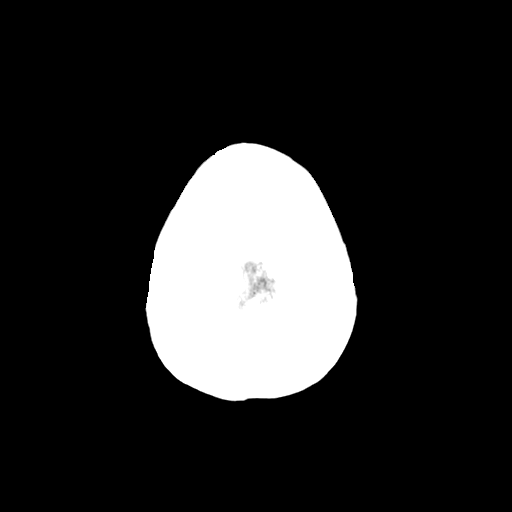
[im 28/31  bone]
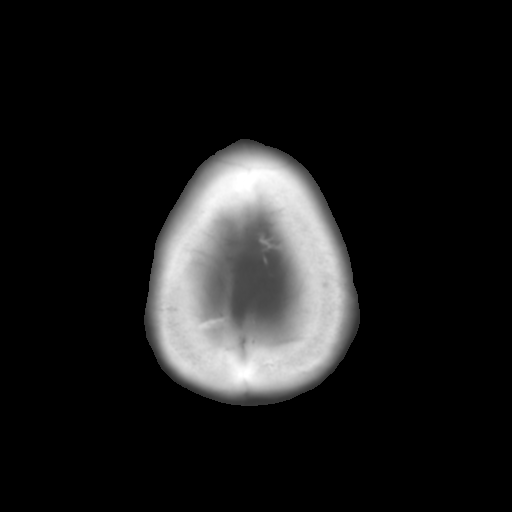

[Series 3: bone · axial · 0.42mm/px · z∈[+178,+218]mm · 3 of 31 slices shown]
[im 3/31  bone]
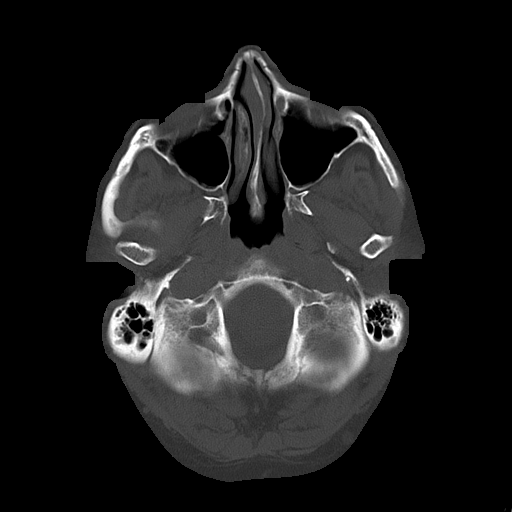
[im 7/31  bone]
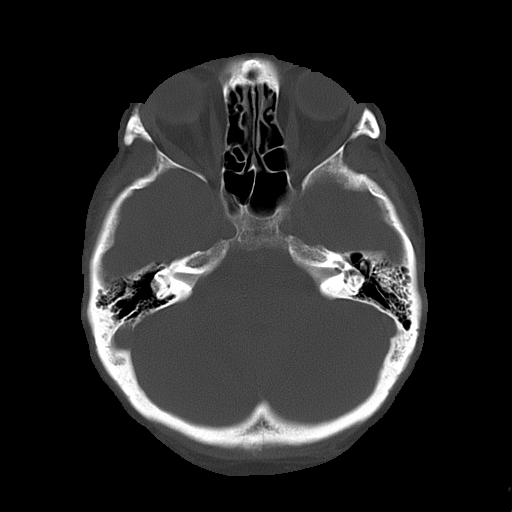
[im 11/31  bone]
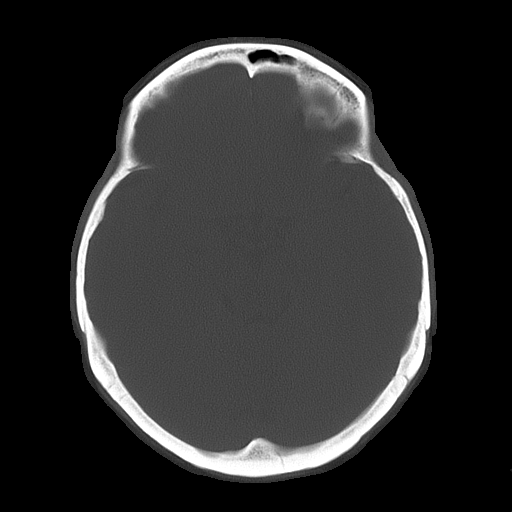

[16 of 30 positions shown; findings below may reference images not displayed]

PROCEDURE:     CT  - CT HEAD WITHOUT CONTRAST  - [DATE]  [DATE]

RESULT:     Emergent noncontrast CT of the brain is performed in the
standard fashion. The patient has no previous exam for comparison.

The ventricles and sulci are normal. There is no hemorrhage. There is no
focal mass, mass-effect or midline shift. There is no evidence of edema or
territorial infarct. The bone windows demonstrate normal aeration of the
paranasal sinuses and mastoid air cells. There is no skull fracture
demonstrated.
IMPRESSION: 1. No acute intracranial abnormality.

[REDACTED]

## 2012-01-26 MED ORDER — DONEPEZIL HCL 5 MG PO TABS: 5.0000 mg | ORAL_TABLET | Freq: Every evening | ORAL | Status: DC | PRN

## 2012-01-26 MED ORDER — AMITRIPTYLINE HCL 25 MG PO TABS: 25.0000 mg | ORAL_TABLET | Freq: Every day | ORAL | Status: DC

## 2012-01-26 NOTE — Progress Notes (Signed)
Nature conservation officer at [facility] [address] [place] [address] Phone: [phone] Fax: [phone]  Date:  01/26/2012   Name:  Brandi Mahoney   DOB:  15-Dec-1955   MRN:  [id number] Gender: female  Age: 56 y.o.  PCP:  Hannah Beat, MD    Chief Complaint: Headache   History of Present Illness:  Brandi Mahoney is a 56 y.o. pleasant patient who presents with the following:  Patient known very well who presents with a one-month history of headache, nauseousness, photophobia, phonophobia, decreased concentration after a closed head injury one month ago.  No memory, face planted on the concrete. Struck her face, and sunglasses had a huge scarpe all over the face, legs, and multiple places on her body. Knee is still hurting. According to her husband, she appeared as if she was very much out of it at the time and she was dazed.she did not lose consciousness.  Hit head, was able to walk, but was quite dazed. No amnesia.  Some nausea, for the last month. Light does maybe make worse.  She does note that reading and using the computer a great deal make her symptoms worse.  On the computer a lot at work ---   A few days later, went to the walkin clinic. Felt like concussion.  1 week later, [facility] - across from [employer]. Again, was told that she likely had a concussion.  They also felt that she injured her knee, but did not find any occult fracture.  Since the time of her initial injury one month ago, the patient has continued to be very active, interactive, and never gave herself anytime for neurocognitive rest. She is continued to work. She does note that she feels very symptomatic during the end of the workday, and has been unable to do much of anything over and above work. Her family also notes increased irritability and mood and more emotional state.  She has been sleeping okay. She does not note any particular dizziness or disruption in her balance.  She does have  significant LEFT knee pain. Initially she had a significant scrape. Now she is having some medial knee pain as well as pain when having deep flexion. There is no significant effusion currently.  Patient Active Problem List  Diagnosis  . HYPOTHYROIDISM  . DIABETES MELLITUS, TYPE II  . HYPERTRIGLYCERIDEMIA  . HYPERLIPIDEMIA-MIXED  . DEPRESSION  . OTHER ACUTE MYOCARDITIS  . ASTHMA  . OTHER MALAISE AND FATIGUE  . PARESTHESIA, HANDS    Past Medical History  Diagnosis Date  . Asthma   . Depression   . Diabetes mellitus     type II  . Migraine headache   . IBS (irritable bowel syndrome)   . Hypothyroidism   . Myocarditis 01/21/2010    hospitalized- [facility]  . Hemorrhoids   . HLD (hyperlipidemia)   . History of pneumonia 1969    Past Surgical History  Procedure Date  . Appendectomy 1974  . Cholecystectomy 2002  . Abdominal hysterectomy 2000    no ovaries, uterus, or cervix.  . Cardiac catheterization 8/11    no significant -[facility]- Dr Mariah Milling,    History  Substance Use Topics  . Smoking status: Former Games developer  . Smokeless tobacco: Never Used   Comment: quit in 2007-2008, social smoker  . Alcohol Use: Yes     rarely    Family History  Problem Relation Age of Onset  . Breast cancer Paternal Grandmother   . Lung cancer  Paternal Grandfather   . Coronary artery disease Mother   . Diabetes Mother   . ALS Mother   . Other Daughter     gluten  . Hyperlipidemia Father   . Down syndrome Son   . Colitis Maternal Grandfather     Allergies  Allergen Reactions  . Pseudoeph-Doxylamine-Dm-Apap     Allergy to nyquil  . Gluten   . Penicillins     rash    Medication list has been reviewed and updated.  Current Outpatient Prescriptions on File Prior to Visit  Medication Sig Dispense Refill  . aspirin 81 MG tablet Take 81 mg by mouth daily.        . ergocalciferol (VITAMIN D2) 50000 UNITS capsule Take 1 capsule (50,000 Units total) by mouth once a week.  4 capsule  3  .  fenofibrate (TRICOR) 145 MG tablet TAKE 1 TABLET BY MOUTH EVERY DAY  30 tablet  5  . glucose blood (ACCU-CHEK AVIVA) test strip Use as instructed       . lansoprazole (PREVACID) 30 MG capsule Take 30 mg by mouth daily.      Marland Kitchen levothyroxine (SYNTHROID, LEVOTHROID) 50 MCG tablet Take 1 tablet (50 mcg total) by mouth daily.  30 tablet  6  . nitroGLYCERIN (NITROSTAT) 0.4 MG SL tablet 1 tablet under tongue at onset of chest pain; you may repeat every 5 minutes for up to 3 doses.  25 tablet  12  . MOVIPREP 100 G SOLR Take 1 kit (100 g total) by mouth once.  1 kit  0  . DISCONTD: pravastatin (PRAVACHOL) 40 MG tablet TAKE 1 TABLET BY MOUTH AT BEDTIME  30 tablet  2  . DISCONTD: sertraline (ZOLOFT) 50 MG tablet TAKE 1 TABLET (50 MG TOTAL) BY MOUTH DAILY.  30 tablet  5  . DISCONTD: topiramate (TOPAMAX) 50 MG tablet Take one tablet twice daily.  60 tablet  5    Review of Systems:   GEN: as above GI: some nausea, eating normally Pulm: No SOB Interactive and getting along well at home. Headaches. No blurred vision. No dysarthria. No focal weakness. No balance disturbance. No facial weakness. Otherwise neurological symptoms are described above. Knee pain as described above Otherwise, the pertinent positives and negatives are listed above and in the HPI, otherwise a full review of systems has been reviewed and is negative unless noted positive.   Physical Examination: Filed Vitals:   01/26/12 1540  BP: 124/78  Pulse: 88  Temp: 98.5 F (36.9 C)   Filed Vitals:   01/26/12 1540  Weight: 170 lb (77.111 kg)   There is no height on file to calculate BMI. Ideal Body Weight:     GEN: WDWN, NAD, Non-toxic, A & O x 3 HEENT: Atraumatic, Normocephalic. Neck supple. No masses, No LAD. Ears and Nose: No external deformity. CV: RRR, No M/G/R. No JVD. No thrill. No extra heart sounds. PULM: CTA B, no wheezes, crackles, rhonchi. No retractions. No resp. distress. No accessory muscle use. ABD: S, NT, ND,  +BS. No rebound tenderness. No HSM.  EXTR: No c/c/e  Neuro: CN 2-12 grossly intact. PERRLA. EOMI. Sensation intact throughout. Str 5/5 all extremities. DTR 2+. No clonus. A and o x 4. Romberg neg. Finger nose neg. Heel -shin neg. Mildly unsteady on her BESS testing, but not remarkably so.  LEFT knee is notably tender on the medial joint line. Nontender on the lateral joint line. Stable with MCL and LCL stress. Negative Lachman. Negative anterior and  posterior drawer testing. Negative bounce home test. Positive McMurray's test. Positive flexion pinch test. Nontender with patellar compression.  PSYCH: Normally interactive. Conversant. Not depressed or anxious appearing.  Calm demeanor.     Assessment and Plan:  1. Postconcussion syndrome  amitriptyline (ELAVIL) 25 MG tablet, donepezil (ARICEPT) 5 MG tablet, CT Head Wo Contrast, Ambulatory referral to Neurology  2. Internal derangement of left knee     >40 minutes spent in face to face time with patient, >50% spent in counselling or coordination of care: the majority of the time of the visit was spent in discussion regarding concussion, mild traumatic brain injury, recovery from this as well as postconcussive states. I went into detail the importance of complete neurocognitive rest, and the importance of diminishing provoking symptoms including diminished time and stress in the work place to enable the patient's brain to recover more rapidly and more fully. I have concerns with her working a full 40-50 hour work week right now and being quite symptomatic at the end of the day. I believe that this will impair her ability to recover with maximal speed. This is been clearly documented in postconcussive states. Trial of Aricept 5 mg, which is an off label use of this medication, but has been used in traumatic brain injury and brain irradiation cases. Amitriptyline 25 mg at nighttime.  I have written her some work limitations and given her the ACE return  to work information from [employer].  Obtain a CT of the head without contrast to rule out subdural hematoma.  I am going to consult neurology to have them follow along this case with me. I would appreciate any other suggestions that they might have.   I am suspicious for a medial meniscal tear. We discussed the we need to emphasize her brain recovery at this point. I am going to recheck her knee in 3 weeks. Some of this may be due to trauma and bony contusion.  Orders Today:  Orders Placed This Encounter  Procedures  . CT Head Wo Contrast    Standing Status: Future     Number of Occurrences:      Standing Expiration Date: 04/27/2013    Order Specific Question:  Reason for exam:    Answer:  [facility], CT Head without contrast    Order Specific Question:  Is the patient pregnant?    Answer:  No    Order Specific Question:  Preferred imaging location?    Answer:  External  . Ambulatory referral to Neurology    Referral Priority:  Routine    Referral Type:  Consultation    Referral Reason:  Specialty Services Required    Requested Specialty:  Neurology    Number of Visits Requested:  1    Medications Today: (Includes new updates added during medication reconciliation) Meds ordered this encounter  Medications  . amitriptyline (ELAVIL) 25 MG tablet    Sig: Take 1 tablet (25 mg total) by mouth at bedtime.    Dispense:  30 tablet    Refill:  3  . donepezil (ARICEPT) 5 MG tablet    Sig: Take 1 tablet (5 mg total) by mouth at bedtime as needed.    Dispense:  30 tablet    Refill:  3    Medications Discontinued: There are no discontinued medications.  Labs Results from 01/26/2012 that have returned and recent labs: Results for orders placed in visit on 10/25/11  CBC WITH DIFFERENTIAL      Component Value Range  WBC 7.3  4.5 - 10.5 K/uL   RBC 4.68  3.87 - 5.11 Mil/uL   Hemoglobin 12.5  12.0 - 15.0 g/dL   HCT 16.1  09.6 - 04.5 %   MCV 81.6  78.0 - 100.0 fl   MCHC 32.7  30.0 -  36.0 g/dL   RDW 40.9  81.1 - 91.4 %   Platelets 258.0  150.0 - 400.0 K/uL   Neutrophils Relative 61.5  43.0 - 77.0 %   Lymphocytes Relative 27.9  12.0 - 46.0 %   Monocytes Relative 6.4  3.0 - 12.0 %   Eosinophils Relative 3.8  0.0 - 5.0 %   Basophils Relative 0.4  0.0 - 3.0 %   Neutro Abs 4.5  1.4 - 7.7 K/uL   Lymphs Abs 2.0  0.7 - 4.0 K/uL   Monocytes Absolute 0.5  0.1 - 1.0 K/uL   Eosinophils Absolute 0.3  0.0 - 0.7 K/uL   Basophils Absolute 0.0  0.0 - 0.1 K/uL  LIPASE      Component Value Range   Lipase 29.0  11.0 - 59.0 U/L     Hannah Beat, MD

## 2012-01-26 NOTE — Patient Instructions (Addendum)
REFERRAL: GO THE THE FRONT ROOM AT THE ENTRANCE OF OUR CLINIC, NEAR CHECK IN. ASK FOR MARION. SHE WILL HELP YOU SET UP YOUR REFERRAL. DATE: TIME:  

## 2012-01-29 ENCOUNTER — Encounter: Payer: Self-pay | Admitting: Family Medicine

## 2012-01-30 ENCOUNTER — Telehealth: Payer: Self-pay

## 2012-01-30 NOTE — Telephone Encounter (Signed)
Brandi Mahoney left v/m requesting order for CT of Head w/o contrast faxed to [phone] and call back.Left v/m to Tiny that order was faxed this AM and had previously been faxed by pt care coordinator. If further problem please call [phone].

## 2012-03-25 ENCOUNTER — Other Ambulatory Visit: Payer: Self-pay | Admitting: Family Medicine

## 2012-04-04 ENCOUNTER — Emergency Department: Payer: Self-pay | Admitting: Emergency Medicine

## 2012-04-04 LAB — CBC
HCT: 35.6 % (ref 35.0–47.0)
HGB: 12 g/dL (ref 12.0–16.0)
MCH: 27.4 pg (ref 26.0–34.0)
MCHC: 33.6 g/dL (ref 32.0–36.0)
RBC: 4.38 10*6/uL (ref 3.80–5.20)

## 2012-04-04 LAB — COMPREHENSIVE METABOLIC PANEL
Albumin: 4.2 g/dL (ref 3.4–5.0)
Alkaline Phosphatase: 76 U/L (ref 50–136)
Bilirubin,Total: 0.3 mg/dL (ref 0.2–1.0)
Calcium, Total: 8.5 mg/dL (ref 8.5–10.1)
Co2: 23 mmol/L (ref 21–32)
Glucose: 107 mg/dL — ABNORMAL HIGH (ref 65–99)
Potassium: 3.8 mmol/L (ref 3.5–5.1)
SGOT(AST): 29 U/L (ref 15–37)
Sodium: 146 mmol/L — ABNORMAL HIGH (ref 136–145)
Total Protein: 7 g/dL (ref 6.4–8.2)

## 2012-04-04 LAB — TROPONIN I: Troponin-I: <0.02 ng/mL

## 2012-04-04 LAB — CK TOTAL AND CKMB (NOT AT ARMC): CK, Total: 91 U/L (ref 21–215)

## 2012-04-04 IMAGING — CR DG CHEST 2V
1 series · 2 of 2 positions shown · non-contrast
Comparison: none

REASON FOR EXAM: chest pain
COMMENTS:   May transport without cardiac monitor

[Series 1: w chest pa · 0.14mm/px · 2 of 2 slices shown]
[im 1/2]
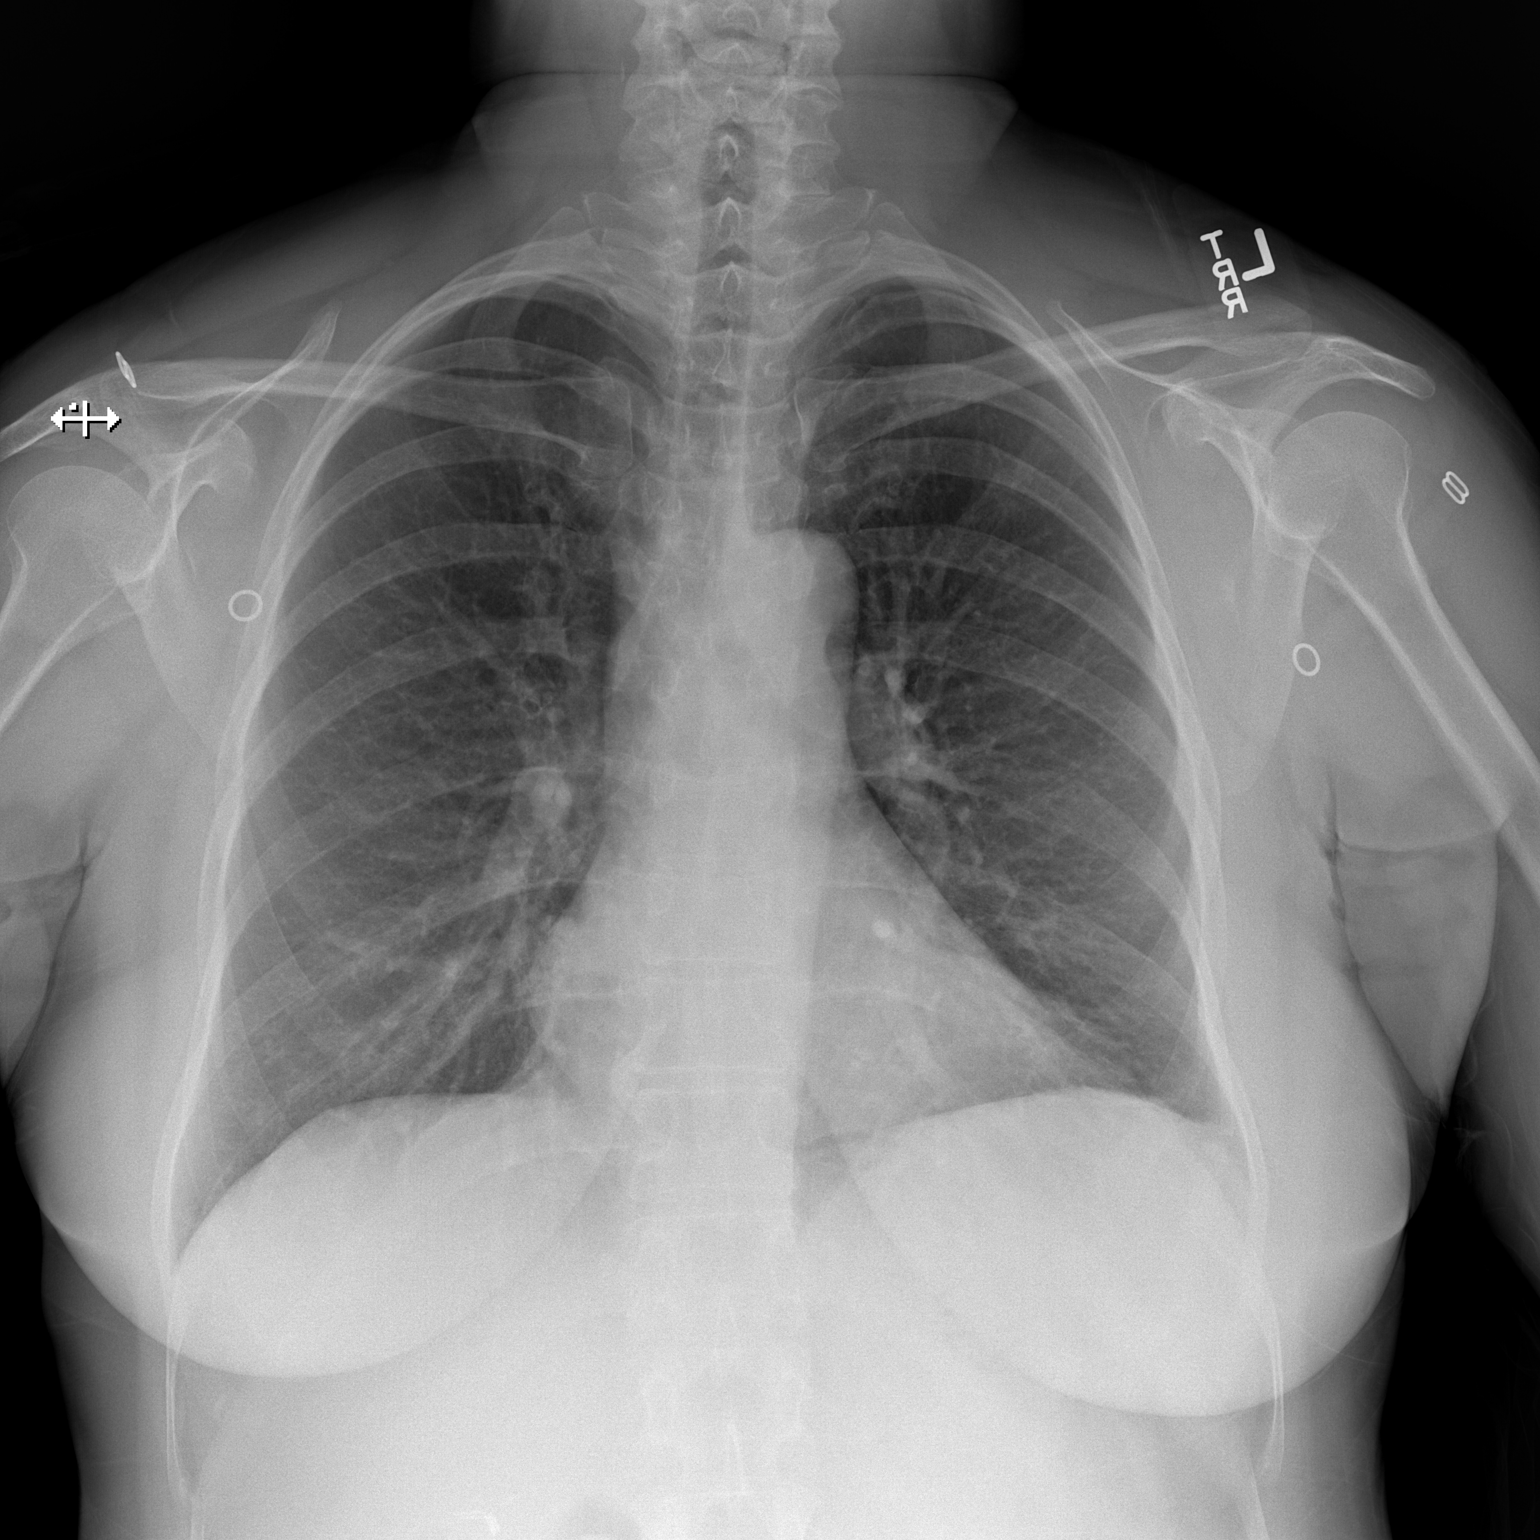
[im 2/2]
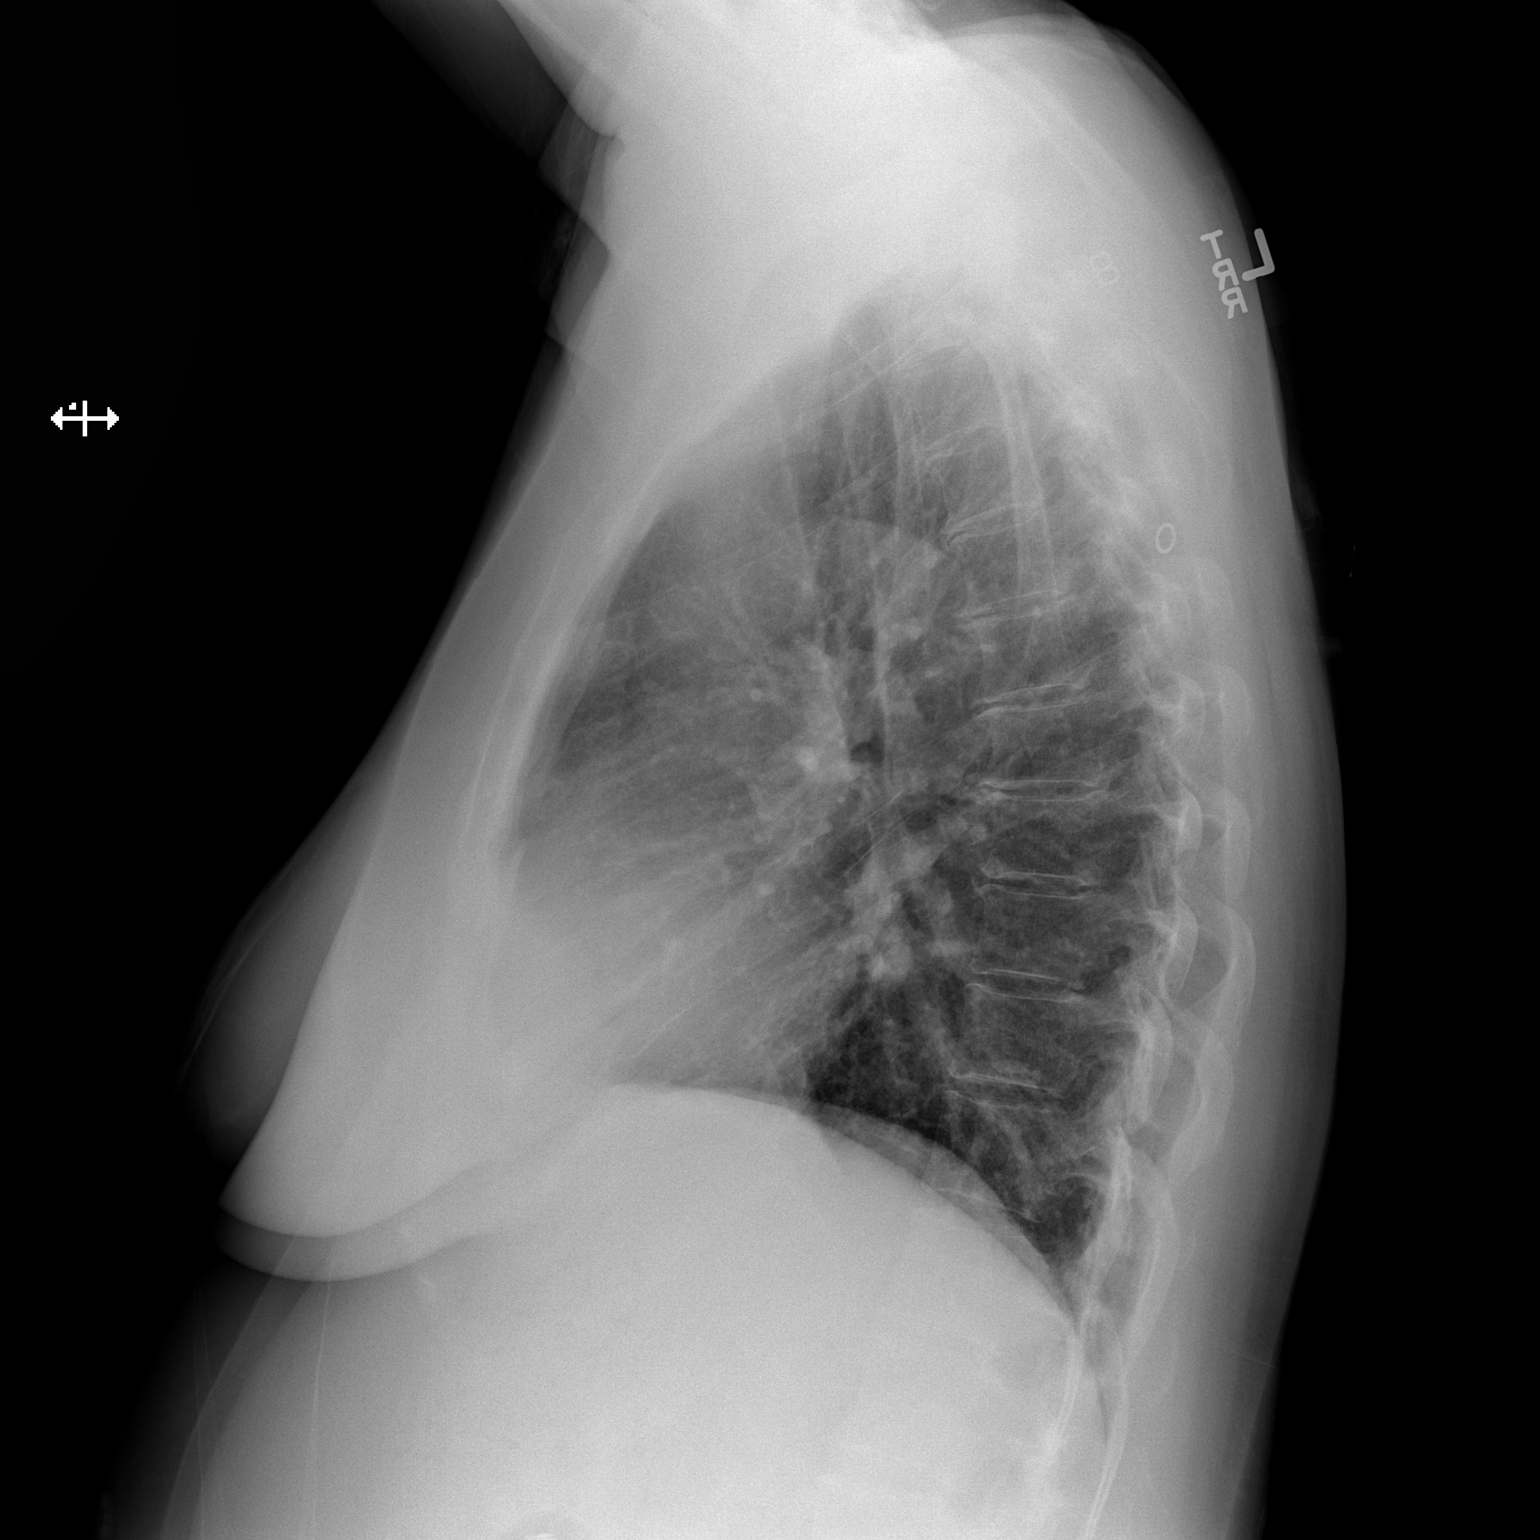

[2 of 2 positions shown; findings below may reference images not displayed]

PROCEDURE:     DXR - DXR CHEST PA (OR AP) AND LATERAL  - [DATE] [DATE]

RESULT:     Comparison is made with previous exam dated [DATE].

The lungs are clear. The heart and pulmonary vessels are normal. The bony
and mediastinal structures are unremarkable. There is no effusion. There is
no pneumothorax or evidence of congestive failure.
IMPRESSION: No acute cardiopulmonary disease.

[REDACTED]

## 2012-05-15 ENCOUNTER — Ambulatory Visit (INDEPENDENT_AMBULATORY_CARE_PROVIDER_SITE_OTHER): Payer: PRIVATE HEALTH INSURANCE | Admitting: Family Medicine

## 2012-05-15 ENCOUNTER — Encounter: Payer: Self-pay | Admitting: Family Medicine

## 2012-05-15 VITALS — BP 120/82 | HR 70 | Temp 98.4°F | Ht 64.0 in | Wt 166.5 lb

## 2012-05-15 DIAGNOSIS — L089 Local infection of the skin and subcutaneous tissue, unspecified: Secondary | ICD-10-CM

## 2012-05-15 DIAGNOSIS — L723 Sebaceous cyst: Secondary | ICD-10-CM

## 2012-05-16 NOTE — Progress Notes (Signed)
Nature conservation officer at [facility] [address] [place] [address] Phone: [phone] Fax: [phone]  Date:  05/15/2012   Name:  Brandi Mahoney   DOB:  [birth year]   MRN:  [id number] Gender: female Age: 56 y.o.  PCP:  Hannah Beat, MD  Evaluating MD: Hannah Beat, MD   Chief Complaint: Cyst   History of Present Illness:  Brandi Mahoney is a 56 y.o. pleasant patient who presents with the following:  Presents with a very painful sebaceous cyst on the back of her neck back and it is approximately the size of a half dollar. It is raised and mobile. It is mildly red. It is tender to touch and has been worsening over the last 2 weeks. Does have a history of having an infected sebaceous cyst on her LEFT posterior shoulder several months ago.  She is generally fairly healthy, but she does have diabetes. This is under reasonable control. Diet controlled.    Patient Active Problem List  Diagnosis  . HYPOTHYROIDISM  . DIABETES MELLITUS, TYPE II  . HYPERTRIGLYCERIDEMIA  . HYPERLIPIDEMIA-MIXED  . DEPRESSION  . OTHER ACUTE MYOCARDITIS  . ASTHMA  . OTHER MALAISE AND FATIGUE  . PARESTHESIA, HANDS    Past Medical History  Diagnosis Date  . Asthma   . Depression   . Diabetes mellitus     type II  . Migraine headache   . IBS (irritable bowel syndrome)   . Hypothyroidism   . Myocarditis 01/21/2010    hospitalized- [facility]  . Hemorrhoids   . HLD (hyperlipidemia)   . History of pneumonia 1969    Past Surgical History  Procedure Date  . Appendectomy 1974  . Cholecystectomy 2002  . Abdominal hysterectomy 2000    no ovaries, uterus, or cervix.  . Cardiac catheterization 8/11    no significant -[facility]- Dr Mariah Milling,    History  Substance Use Topics  . Smoking status: Former Games developer  . Smokeless tobacco: Never Used     Comment: quit in 2007-2008, social smoker  . Alcohol Use: Yes     Comment: rarely    Family History  Problem Relation Age of Onset  . Breast  cancer Paternal Grandmother   . Lung cancer Paternal Grandfather   . Coronary artery disease Mother   . Diabetes Mother   . ALS Mother   . Other Daughter     gluten  . Hyperlipidemia Father   . Down syndrome Son   . Colitis Maternal Grandfather     Allergies  Allergen Reactions  . Pseudoeph-Doxylamine-Dm-Apap     Allergy to nyquil  . Gluten   . Penicillins     rash    Medication list has been reviewed and updated.  Outpatient Prescriptions Prior to Visit  Medication Sig Dispense Refill  . amitriptyline (ELAVIL) 25 MG tablet Take 1 tablet (25 mg total) by mouth at bedtime.  30 tablet  3  . aspirin 81 MG tablet Take 81 mg by mouth daily.        Marland Kitchen donepezil (ARICEPT) 5 MG tablet Take 1 tablet (5 mg total) by mouth at bedtime as needed.  30 tablet  3  . ergocalciferol (VITAMIN D2) 50000 UNITS capsule Take 1 capsule (50,000 Units total) by mouth once a week.  4 capsule  3  . fenofibrate (TRICOR) 145 MG tablet TAKE 1 TABLET BY MOUTH EVERY DAY  30 tablet  5  . glucose blood (ACCU-CHEK AVIVA) test strip Use as instructed       .  lansoprazole (PREVACID) 30 MG capsule Take 30 mg by mouth daily.      Marland Kitchen levothyroxine (SYNTHROID, LEVOTHROID) 50 MCG tablet TAKE 1 TABLET BY MOUTH EVERY DAY  30 tablet  6  . nitroGLYCERIN (NITROSTAT) 0.4 MG SL tablet 1 tablet under tongue at onset of chest pain; you may repeat every 5 minutes for up to 3 doses.  25 tablet  12  . pravastatin (PRAVACHOL) 40 MG tablet TAKE 1 TABLET BY MOUTH AT BEDTIME  30 tablet  2  . sertraline (ZOLOFT) 50 MG tablet TAKE 1 TABLET (50 MG TOTAL) BY MOUTH DAILY.  30 tablet  5  . topiramate (TOPAMAX) 50 MG tablet TAKE 1 TABLET TWICE DAILY  60 tablet  4  . MOVIPREP 100 G SOLR Take 1 kit (100 g total) by mouth once.  1 kit  0   Last reviewed on 01/26/2012  3:40 PM by Ranae Plumber Dance, CMA  Review of Systems:  No fever chills or sweats. No chest pain or shortness of breath.  Physical Examination: Filed Vitals:   05/15/12 1519    BP: 120/82  Pulse: 70  Temp: 98.4 F (36.9 C)  TempSrc: Oral  Height: 5\' 4"  (1.626 m)  Weight: 166 lb 8 oz (75.524 kg)  SpO2: 98%    Body mass index is 28.58 kg/(m^2). Ideal Body Weight: Weight in (lb) to have BMI = 25: 145.3    GEN: WDWN, NAD, Non-toxic, A & O x 3 HEENT: Atraumatic, Normocephalic. Neck supple. No masses, No LAD. Ears and Nose: No external deformity. CV: RRR, No M/G/R. No JVD. No thrill. No extra heart sounds. PULM: CTA B, no wheezes, crackles, rhonchi. No retractions. No resp. distress. No accessory muscle use. EXTR: No c/c/e NEURO Normal gait.  PSYCH: Normally interactive. Conversant. Not depressed or anxious appearing.  Calm demeanor.  SKIN: raised tender area that is freely movable and the thoracic area approximately at T9. Tender to palpation. There is what appears to be a blackhead adjacent to the raised area as well. There is no fluctuance.  Assessment and Plan:  1. Infected cyst of skin  Wound culture  2. Infected sebaceous cyst     Classic sebaceous cyst infection.  I&D Indication: suspect sebaceous cyst infection, pain Pt complaints of: erythema, pain, swelling Location: around T9 but superficial Size: > 1.5 inches Verbal informed consent obtained.  Pt aware of risks not limited to but including infection, bleeding, damage to near by organs. Prep: etoh/betadine Anesthesia: 1%lidocaine with epi, good effect Incision made with #11 blade Would explored and loculations removed. Multiple loculations broken up. Amount expressed: approximately 2-1/2 cc of purulent, congealed material. Somewhat malodorous. Wound packed with iodoform gauze Tolerated well Routine postprocedure instructions d/w pt- remove packing in 24-48h, keep area clean and bandaged, follow up if concerns/spreading erythema/pain. After removing gauze, shower and let the water hit her back for the next several days twice a day.   Orders Today:  Orders Placed This Encounter   Procedures  . Wound culture    Cyst on patients back    Updated Medication List: (Includes new medications, updates to list, dose adjustments) No orders of the defined types were placed in this encounter.    Medications Discontinued: Medications Discontinued During This Encounter  Medication Reason  . MOVIPREP 100 G SOLR   . donepezil (ARICEPT) 5 MG tablet      Hannah Beat, MD

## 2012-05-19 LAB — WOUND CULTURE

## 2012-05-20 ENCOUNTER — Encounter: Payer: Self-pay | Admitting: *Deleted

## 2012-07-01 ENCOUNTER — Encounter: Payer: Self-pay | Admitting: *Deleted

## 2012-07-01 ENCOUNTER — Ambulatory Visit (INDEPENDENT_AMBULATORY_CARE_PROVIDER_SITE_OTHER): Payer: PRIVATE HEALTH INSURANCE | Admitting: Family Medicine

## 2012-07-01 ENCOUNTER — Encounter: Payer: Self-pay | Admitting: Family Medicine

## 2012-07-01 VITALS — BP 130/70 | HR 97 | Temp 102.6°F | Ht 64.0 in | Wt 163.5 lb

## 2012-07-01 DIAGNOSIS — J101 Influenza due to other identified influenza virus with other respiratory manifestations: Secondary | ICD-10-CM

## 2012-07-01 DIAGNOSIS — J111 Influenza due to unidentified influenza virus with other respiratory manifestations: Secondary | ICD-10-CM

## 2012-07-01 MED ORDER — OSELTAMIVIR PHOSPHATE 75 MG PO CAPS: 75.0000 mg | ORAL_CAPSULE | Freq: Two times a day (BID) | ORAL | Status: DC

## 2012-07-01 NOTE — Progress Notes (Signed)
Nature conservation officer at [facility] [address] [place] [address] Phone: [phone] Fax: [phone]  Date:  07/01/2012   Name:  Brandi Mahoney   DOB:  1955-08-27   MRN:  [id number] Gender: female Age: 57 y.o.  PCP:  Hannah Beat, MD  Evaluating MD: Hannah Beat, MD  History of Present Illness:  Brandi Mahoney presents with runny nose, sneezing, cough, sore throat, malaise, myalgias, arthralgia, chills, and fever. 103 Fever, started on Saturday.  Coughing like crazy, every bone in your body hurts.  Nausea.  Unkown sick contacts with flu.  probable recent exposure to others with similar symptoms.   The patent denies sore throat as the primary complaint. Denies sthortness of breath/wheezing, otalgia, facial pain, abdominal pain, changes in bowel or bladder.  Generally feels terrible  Tmax: 103  PMH, PHS, Allergies, Problem List, Medications, Family History, and Social History have all been reviewed.  Patient Active Problem List  Diagnosis  . HYPOTHYROIDISM  . DIABETES MELLITUS, TYPE II  . HYPERTRIGLYCERIDEMIA  . HYPERLIPIDEMIA-MIXED  . DEPRESSION  . OTHER ACUTE MYOCARDITIS  . ASTHMA  . OTHER MALAISE AND FATIGUE  . PARESTHESIA, HANDS    Past Medical History  Diagnosis Date  . Asthma   . Depression   . Diabetes mellitus     type II  . Migraine headache   . IBS (irritable bowel syndrome)   . Hypothyroidism   . Myocarditis 01/21/2010    hospitalized- [facility]  . Hemorrhoids   . HLD (hyperlipidemia)   . History of pneumonia 1969    Past Surgical History  Procedure Date  . Appendectomy 1974  . Cholecystectomy 2002  . Abdominal hysterectomy 2000    no ovaries, uterus, or cervix.  . Cardiac catheterization 8/11    no significant -[facility]- Dr Mariah Milling,    History  Substance Use Topics  . Smoking status: Former Games developer  . Smokeless tobacco: Never Used     Comment: quit in 2007-2008, social smoker  . Alcohol Use: Yes     Comment: rarely     Family History  Problem Relation Age of Onset  . Breast cancer Paternal Grandmother   . Lung cancer Paternal Grandfather   . Coronary artery disease Mother   . Diabetes Mother   . ALS Mother   . Other Daughter     gluten  . Hyperlipidemia Father   . Down syndrome Son   . Colitis Maternal Grandfather     Allergies  Allergen Reactions  . Pseudoeph-Doxylamine-Dm-Apap     Allergy to nyquil  . Gluten   . Penicillins     rash    Current Outpatient Prescriptions on File Prior to Visit  Medication Sig Dispense Refill  . amitriptyline (ELAVIL) 25 MG tablet Take 1 tablet (25 mg total) by mouth at bedtime.  30 tablet  3  . aspirin 81 MG tablet Take 81 mg by mouth daily.        . ergocalciferol (VITAMIN D2) 50000 UNITS capsule Take 1 capsule (50,000 Units total) by mouth once a week.  4 capsule  3  . fenofibrate (TRICOR) 145 MG tablet TAKE 1 TABLET BY MOUTH EVERY DAY  30 tablet  5  . glucose blood (ACCU-CHEK AVIVA) test strip Use as instructed       . lansoprazole (PREVACID) 30 MG capsule Take 30 mg by mouth daily.      Marland Kitchen levothyroxine (SYNTHROID, LEVOTHROID) 50 MCG tablet TAKE 1 TABLET BY MOUTH EVERY DAY  30 tablet  6  . nitroGLYCERIN (NITROSTAT) 0.4 MG SL tablet 1 tablet under tongue at onset of chest pain; you may repeat every 5 minutes for up to 3 doses.  25 tablet  12  . pravastatin (PRAVACHOL) 40 MG tablet TAKE 1 TABLET BY MOUTH AT BEDTIME  30 tablet  2  . sertraline (ZOLOFT) 50 MG tablet TAKE 1 TABLET (50 MG TOTAL) BY MOUTH DAILY.  30 tablet  5  . topiramate (TOPAMAX) 50 MG tablet TAKE 1 TABLET TWICE DAILY  60 tablet  4     Review of Systems: as above, eating and drinking - tolerating PO. Urinating normally. No excessive vomitting or diarrhea. O/w as above.  Physical Exam:  Filed Vitals:   07/01/12 1154  BP: 130/70  Pulse: 97  Temp: 102.6 F (39.2 C)  TempSrc: Oral  Height: 5\' 4"  (1.626 m)  Weight: 163 lb 8 oz (74.163 kg)    Gen: WDWN, NAD; A & O x3,  cooperative. Pleasant.Globally Non-toxic HEENT: Normocephalic and atraumatic. Throat clear, w/o exudate, R TM clear, L TM - good landmarks, No fluid present. rhinnorhea. No frontal or maxillary sinus T. MMM NECK: Anterior cervical  LAD is absent CV: RRR, No M/G/R, cap refill <2 sec PULM: Breathing comfortably in no respiratory distress. no wheezing, crackles, rhonchi ABD: S,NT,ND,+BS. No HSM. No rebound. EXT: No c/c/e PSYCH: Friendly, good eye contact MSK: Nml gait  Assessment and Plan: 1. Influenza: The patient's clinical exam and history is consistent with a diagnosis of influenza. Tamiflu Supportive care. Fluids. Cough medicines as needed  Anti-pyretics.  Infection control emphasized, including OOW or school until AF 24 hours.   1. Influenza A with respiratory manifestations     Orders Today:  No orders of the defined types were placed in this encounter.    Updated Medication List: (Includes new medications, updates to list, dose adjustments) Meds ordered this encounter  Medications  . oseltamivir (TAMIFLU) 75 MG capsule    Sig: Take 1 capsule (75 mg total) by mouth 2 (two) times daily.    Dispense:  10 capsule    Refill:  0    Medications Discontinued: There are no discontinued medications.

## 2012-07-09 ENCOUNTER — Telehealth: Payer: Self-pay | Admitting: Family Medicine

## 2012-07-09 NOTE — Telephone Encounter (Signed)
Patient Information:  Caller Name: Larita Fife  Phone: [phone]  Patient: Brandi Mahoney, Brandi Mahoney  Gender: Female  DOB: May 14, 1956  Age: 57 Years  PCP: Hannah Beat (Family Practice)  Office Follow Up:  Does the office need to follow up with this patient?: Yes  Instructions For The Office: Requesting night time cough medication  RN Note:  Recovering from influenza; Completed Tamiflu. Returned to work; feels much better than last week. Does not test blood sugar.  History of asthma in remote past; does not use inhalers. Advised to hydrate and humidify. Requests MD call in cough [facility] to allow patient to sleep.  Symptoms  Reason For Call & Symptoms: Called to request cough medication to permit sleep.  Reviewed Health History In EMR: Yes  Reviewed Medications In EMR: Yes  Reviewed Allergies In EMR: Yes  Reviewed Surgeries / Procedures: Yes  Date of Onset of Symptoms: 06/28/2012  Treatments Tried: Tessalon pearles, Theraflu  Treatments Tried Worked: No  Guideline(s) Used:  Influenza Follow-Up Call  Disposition Per Guideline:   Home Care  Reason For Disposition Reached:   Influenza (diagnosed by HCP) and no complications  Advice Given:  N/A

## 2012-07-09 NOTE — Telephone Encounter (Signed)
Reasonable  Hycodan susp 1 tsp po at night before bed prn cough 240 mL, 0 refills

## 2012-07-10 NOTE — Telephone Encounter (Signed)
rx called to pharmacy 

## 2012-07-29 ENCOUNTER — Other Ambulatory Visit: Payer: Self-pay | Admitting: Family Medicine

## 2012-07-29 NOTE — Telephone Encounter (Signed)
Can you approve b/c of contradictions

## 2012-09-25 ENCOUNTER — Ambulatory Visit (INDEPENDENT_AMBULATORY_CARE_PROVIDER_SITE_OTHER): Payer: PRIVATE HEALTH INSURANCE | Admitting: Family Medicine

## 2012-09-25 ENCOUNTER — Encounter: Payer: Self-pay | Admitting: Family Medicine

## 2012-09-25 VITALS — BP 120/72 | HR 76 | Temp 98.7°F | Ht 64.0 in | Wt 164.5 lb

## 2012-09-25 DIAGNOSIS — R1084 Generalized abdominal pain: Secondary | ICD-10-CM

## 2012-09-25 DIAGNOSIS — K589 Irritable bowel syndrome without diarrhea: Secondary | ICD-10-CM

## 2012-09-25 NOTE — Patient Instructions (Signed)
Benefiber, Citrucel, Metamucil

## 2012-09-25 NOTE — Progress Notes (Signed)
Nature conservation officer at [facility] [address] [place] [address] Phone: [phone] Fax: [phone]  Date:  09/25/2012   Name:  Brandi Mahoney   DOB:  [birth year]   MRN:  [id number] Gender: female Age: 57 y.o.  Primary Physician:  Hannah Beat, MD  Evaluating MD: Hannah Beat, MD   Chief Complaint: Abdominal Pain   History of Present Illness:  Brandi Mahoney is a 57 y.o. pleasant patient who presents with the following:  Change in bowel habits. Feels bloated a lot. Can head for the bathroom and have to go, and then she will not go. Feels nauseous sometimes with going to the bathroom. Appetite is decreasing. Feels fuller.   Drinking about a quart.  +/- breakfast, sandwich or salad for lunch. Dinner, meat or fish, veggie or potato or fish.  Some diarrhea. Used to be her problem.  Rare constip.  Patient Active Problem List  Diagnosis  . HYPOTHYROIDISM  . DIABETES MELLITUS, TYPE II  . HYPERTRIGLYCERIDEMIA  . HYPERLIPIDEMIA-MIXED  . DEPRESSION  . OTHER ACUTE MYOCARDITIS  . ASTHMA  . OTHER MALAISE AND FATIGUE  . PARESTHESIA, HANDS    Past Medical History  Diagnosis Date  . Asthma   . Depression   . Diabetes mellitus     type II  . Migraine headache   . IBS (irritable bowel syndrome)   . Hypothyroidism   . Myocarditis 01/21/2010    hospitalized- [facility]  . Hemorrhoids   . HLD (hyperlipidemia)   . History of pneumonia 1969    Past Surgical History  Procedure Laterality Date  . Appendectomy  1974  . Cholecystectomy  2002  . Abdominal hysterectomy  2000    no ovaries, uterus, or cervix.  . Cardiac catheterization  8/11    no significant -[facility]- Dr Mariah Milling,    History   Social History  . Marital Status: Married    Spouse Name: N/A    Number of Children: 3  . Years of Education: N/A   Occupational History  . 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     Social History Main Topics  . Smoking status: Former Games developer  . Smokeless tobacco: Never Used   Comment: quit in 2007-2008, social smoker  . Alcohol Use: Yes     Comment: rarely  . Drug Use: No  . Sexually Active: Not on file   Other Topics Concern  . Not on file   Social History Narrative   No reg exercise    Family History  Problem Relation Age of Onset  . Breast cancer Paternal Grandmother   . Lung cancer Paternal Grandfather   . Coronary artery disease Mother   . Diabetes Mother   . ALS Mother   . Other Daughter     gluten  . Hyperlipidemia Father   . Down syndrome Son   . Colitis Maternal Grandfather     Allergies  Allergen Reactions  . Pseudoeph-Doxylamine-Dm-Apap     Allergy to nyquil  . Gluten   . Penicillins     rash    Medication list has been reviewed and updated.  Outpatient Prescriptions Prior to Visit  Medication Sig Dispense Refill  . amitriptyline (ELAVIL) 25 MG tablet Take 1 tablet (25 mg total) by mouth at bedtime.  30 tablet  3  . aspirin 81 MG tablet Take 81 mg by mouth daily.        . ergocalciferol (VITAMIN D2) 50000 UNITS capsule Take 1 capsule (50,000 Units total) by mouth once  a week.  4 capsule  3  . fenofibrate (TRICOR) 145 MG tablet TAKE 1 TABLET BY MOUTH EVERY DAY  30 tablet  5  . glucose blood (ACCU-CHEK AVIVA) test strip Use as instructed       . lansoprazole (PREVACID) 30 MG capsule Take 30 mg by mouth daily.      Marland Kitchen levothyroxine (SYNTHROID, LEVOTHROID) 50 MCG tablet TAKE 1 TABLET BY MOUTH EVERY DAY  30 tablet  6  . nitroGLYCERIN (NITROSTAT) 0.4 MG SL tablet 1 tablet under tongue at onset of chest pain; you may repeat every 5 minutes for up to 3 doses.  25 tablet  12  . pravastatin (PRAVACHOL) 40 MG tablet TAKE 1 TABLET BY MOUTH AT BEDTIME  30 tablet  2  . sertraline (ZOLOFT) 50 MG tablet TAKE 1 TABLET (50 MG TOTAL) BY MOUTH DAILY.  30 tablet  5  . topiramate (TOPAMAX) 50 MG tablet TAKE 1 TABLET TWICE DAILY  60 tablet  4  . oseltamivir (TAMIFLU) 75 MG capsule Take 1 capsule (75 mg total) by mouth 2 (two) times daily.  10 capsule   0   No facility-administered medications prior to visit.    Review of Systems:  No fever, chills, sweats.  Physical Examination: BP 120/72  Pulse 76  Temp(Src) 98.7 F (37.1 C) (Oral)  Ht 5\' 4"  (1.626 m)  Wt 164 lb 8 oz (74.617 kg)  BMI 28.22 kg/m2  SpO2 98%  Ideal Body Weight: Weight in (lb) to have BMI = 25: 145.3  GEN: WDWN, NAD, Non-toxic, A & O x 3 HEENT: Atraumatic, Normocephalic. Neck supple. No masses, No LAD. Ears and Nose: No external deformity. CV: RRR, No M/G/R. No JVD. No thrill. No extra heart sounds. PULM: CTA B, no wheezes, crackles, rhonchi. No retractions. No resp. distress. No accessory muscle use. ABD: S, minimally T, ND, +BS. No rebound. No HSM. EXTR: No c/c/e NEURO Normal gait.  PSYCH: Normally interactive. Conversant. Not depressed or anxious appearing.  Calm demeanor.    Assessment and Plan: IBS (irritable bowel syndrome)  Generalized abdominal cramping   Colon 1 year ago Suspect ibs not well controlled Not nearly enough water Add fiber and fiber supplements  Signed, Joell Usman T. Kassidee Narciso, MD 09/25/2012 4:31 PM

## 2012-09-26 DIAGNOSIS — K589 Irritable bowel syndrome without diarrhea: Secondary | ICD-10-CM | POA: Insufficient documentation

## 2013-01-13 ENCOUNTER — Other Ambulatory Visit: Payer: Self-pay | Admitting: Family Medicine

## 2013-02-21 ENCOUNTER — Other Ambulatory Visit: Payer: Self-pay | Admitting: *Deleted

## 2013-02-21 MED ORDER — TOPIRAMATE 50 MG PO TABS: ORAL_TABLET | ORAL | Status: DC

## 2013-02-21 MED ORDER — PRAVASTATIN SODIUM 40 MG PO TABS: ORAL_TABLET | ORAL | Status: DC

## 2013-03-11 ENCOUNTER — Other Ambulatory Visit: Payer: Self-pay | Admitting: Family Medicine

## 2013-03-11 DIAGNOSIS — E785 Hyperlipidemia, unspecified: Secondary | ICD-10-CM

## 2013-03-11 DIAGNOSIS — E039 Hypothyroidism, unspecified: Secondary | ICD-10-CM

## 2013-03-11 DIAGNOSIS — E119 Type 2 diabetes mellitus without complications: Secondary | ICD-10-CM

## 2013-03-11 DIAGNOSIS — E1059 Type 1 diabetes mellitus with other circulatory complications: Secondary | ICD-10-CM

## 2013-03-11 DIAGNOSIS — Z79899 Other long term (current) drug therapy: Secondary | ICD-10-CM

## 2013-03-12 ENCOUNTER — Other Ambulatory Visit: Payer: PRIVATE HEALTH INSURANCE

## 2013-03-12 ENCOUNTER — Other Ambulatory Visit (INDEPENDENT_AMBULATORY_CARE_PROVIDER_SITE_OTHER): Payer: PRIVATE HEALTH INSURANCE

## 2013-03-12 DIAGNOSIS — E785 Hyperlipidemia, unspecified: Secondary | ICD-10-CM

## 2013-03-12 DIAGNOSIS — E781 Pure hyperglyceridemia: Secondary | ICD-10-CM

## 2013-03-12 DIAGNOSIS — R5381 Other malaise: Secondary | ICD-10-CM

## 2013-03-12 DIAGNOSIS — Z79899 Other long term (current) drug therapy: Secondary | ICD-10-CM

## 2013-03-12 DIAGNOSIS — E039 Hypothyroidism, unspecified: Secondary | ICD-10-CM

## 2013-03-12 DIAGNOSIS — E119 Type 2 diabetes mellitus without complications: Secondary | ICD-10-CM

## 2013-03-12 LAB — BASIC METABOLIC PANEL
CO2: 27 mEq/L (ref 19–32)
Calcium: 9.1 mg/dL (ref 8.4–10.5)
Chloride: 110 mEq/L (ref 96–112)
Creatinine, Ser: 0.8 mg/dL (ref 0.4–1.2)
Glucose, Bld: 115 mg/dL — ABNORMAL HIGH (ref 70–99)
Potassium: 3.8 mEq/L (ref 3.5–5.1)
Sodium: 141 mEq/L (ref 135–145)

## 2013-03-12 LAB — CBC WITH DIFFERENTIAL/PLATELET
Basophils Absolute: 0 10*3/uL (ref 0.0–0.1)
Eosinophils Relative: 3 % (ref 0.0–5.0)
HCT: 39.5 % (ref 36.0–46.0)
Lymphocytes Relative: 28.9 % (ref 12.0–46.0)
Lymphs Abs: 2.1 10*3/uL (ref 0.7–4.0)
Monocytes Relative: 7.5 % (ref 3.0–12.0)
Platelets: 271 10*3/uL (ref 150.0–400.0)
RBC: 4.85 Mil/uL (ref 3.87–5.11)
RDW: 14.6 % (ref 11.5–14.6)
WBC: 7.3 10*3/uL (ref 4.5–10.5)

## 2013-03-12 LAB — HEPATIC FUNCTION PANEL
AST: 28 U/L (ref 0–37)
Albumin: 4.3 g/dL (ref 3.5–5.2)
Alkaline Phosphatase: 58 U/L (ref 39–117)
Total Bilirubin: 0.4 mg/dL (ref 0.3–1.2)
Total Protein: 7.1 g/dL (ref 6.0–8.3)

## 2013-03-12 LAB — TSH: TSH: 4.12 u[IU]/mL (ref 0.35–5.50)

## 2013-03-12 LAB — HEMOGLOBIN A1C: Hgb A1c MFr Bld: 6.8 % — ABNORMAL HIGH (ref 4.6–6.5)

## 2013-03-12 LAB — LIPID PANEL
Cholesterol: 159 mg/dL (ref 0–200)
HDL: 41.2 mg/dL (ref >39.00)
LDL Cholesterol: 87 mg/dL (ref 0–99)
Total CHOL/HDL Ratio: 4
Triglycerides: 152 mg/dL — ABNORMAL HIGH (ref 0.0–149.0)
VLDL: 30.4 mg/dL (ref 0.0–40.0)

## 2013-03-14 ENCOUNTER — Other Ambulatory Visit: Payer: PRIVATE HEALTH INSURANCE

## 2013-03-17 ENCOUNTER — Encounter: Payer: Self-pay | Admitting: Family Medicine

## 2013-03-17 ENCOUNTER — Ambulatory Visit (INDEPENDENT_AMBULATORY_CARE_PROVIDER_SITE_OTHER): Payer: PRIVATE HEALTH INSURANCE | Admitting: Family Medicine

## 2013-03-17 ENCOUNTER — Encounter: Payer: Self-pay | Admitting: *Deleted

## 2013-03-17 VITALS — BP 106/70 | HR 74 | Temp 98.4°F | Ht 64.0 in | Wt 170.2 lb

## 2013-03-17 DIAGNOSIS — Z0000 Encounter for general adult medical examination without abnormal findings: Secondary | ICD-10-CM

## 2013-03-17 NOTE — Telephone Encounter (Signed)
This encounter was created in error - please disregard.

## 2013-03-17 NOTE — Progress Notes (Signed)
Nature conservation officer at [facility] [address] [place] [address] Phone: [phone] Fax: [phone]  Date:  03/17/2013   Name:  Brandi Mahoney   DOB:  Dec 31, 1955   MRN:  [id number] Gender: female Age: 57 y.o.  Primary Physician:  Hannah Beat, MD  Evaluating MD: Hannah Beat, MD   Chief Complaint: Annual Exam   History of Present Illness:  Brandi Mahoney is a 57 y.o. pleasant patient who presents with the following:  CPX:  GYN. Normal pap, mammo ok. Brandi Mahoney, [place].   Health Maintenance Summary Reviewed and updated, unless pt declines services.  Tobacco History Reviewed. Non-smoker Alcohol: No concerns, no excessive use Exercise Habits: Some activity, rec at least 30 mins 5 times a week STD concerns: none Drug Use: None Birth control method: hyst Menses regular: n/a  Health Maintenance  Topic Date Due  . Influenza Vaccine  01/17/2014  . Mammogram  03/18/2015  . Pap Smear  03/17/2016  . Colonoscopy  11/14/2016  . Tetanus/tdap  05/25/2019    Labs reviewed with the patient.  Results for orders placed in visit on 03/12/13  LIPID PANEL      Result Value Range   Cholesterol 159  0 - 200 mg/dL   Triglycerides 865.7 (*) 0.0 - 149.0 mg/dL   HDL 84.69  >62.95 mg/dL   VLDL 28.4  0.0 - 13.2 mg/dL   LDL Cholesterol 87  0 - 99 mg/dL   Total CHOL/HDL Ratio 4    HEPATIC FUNCTION PANEL      Result Value Range   Total Bilirubin 0.4  0.3 - 1.2 mg/dL   Bilirubin, Direct 0.0  0.0 - 0.3 mg/dL   Alkaline Phosphatase 58  39 - 117 U/L   AST 28  0 - 37 U/L   ALT 34  0 - 35 U/L   Total Protein 7.1  6.0 - 8.3 g/dL   Albumin 4.3  3.5 - 5.2 g/dL  CBC WITH DIFFERENTIAL      Result Value Range   WBC 7.3  4.5 - 10.5 K/uL   RBC 4.85  3.87 - 5.11 Mil/uL   Hemoglobin 12.9  12.0 - 15.0 g/dL   HCT 44.0  10.2 - 72.5 %   MCV 81.5  78.0 - 100.0 fl   MCHC 32.8  30.0 - 36.0 g/dL   RDW 36.6  44.0 - 34.7 %   Platelets 271.0  150.0 - 400.0 K/uL   Neutrophils  Relative % 60.0  43.0 - 77.0 %   Lymphocytes Relative 28.9  12.0 - 46.0 %   Monocytes Relative 7.5  3.0 - 12.0 %   Eosinophils Relative 3.0  0.0 - 5.0 %   Basophils Relative 0.6  0.0 - 3.0 %   Neutro Abs 4.4  1.4 - 7.7 K/uL   Lymphs Abs 2.1  0.7 - 4.0 K/uL   Monocytes Absolute 0.6  0.1 - 1.0 K/uL   Eosinophils Absolute 0.2  0.0 - 0.7 K/uL   Basophils Absolute 0.0  0.0 - 0.1 K/uL  BASIC METABOLIC PANEL      Result Value Range   Sodium 141  135 - 145 mEq/L   Potassium 3.8  3.5 - 5.1 mEq/L   Chloride 110  96 - 112 mEq/L   CO2 27  19 - 32 mEq/L   Glucose, Bld 115 (*) 70 - 99 mg/dL   BUN 14  6 - 23 mg/dL   Creatinine, Ser 0.8  0.4 - 1.2 mg/dL  Calcium 9.1  8.4 - 10.5 mg/dL   GFR 45.40  >98.11 mL/min  HEMOGLOBIN A1C      Result Value Range   Hemoglobin A1C 6.8 (*) 4.6 - 6.5 %  TSH      Result Value Range   TSH 4.12  0.35 - 5.50 uIU/mL     Patient Active Problem List   Diagnosis Date Noted  . IBS (irritable bowel syndrome) 09/26/2012  . PARESTHESIA, HANDS 06/01/2010  . HYPERLIPIDEMIA-MIXED 04/01/2010  . OTHER ACUTE MYOCARDITIS 01/27/2010  . HYPERTRIGLYCERIDEMIA 07/21/2009  . OTHER MALAISE AND FATIGUE 09/09/2008  . HYPOTHYROIDISM 09/08/2008  . DIABETES MELLITUS, TYPE II 09/08/2008  . DEPRESSION 09/08/2008  . ASTHMA 09/08/2008    Past Medical History  Diagnosis Date  . Asthma   . Depression   . Diabetes mellitus     type II  . Migraine headache   . IBS (irritable bowel syndrome)   . Hypothyroidism   . Myocarditis 01/21/2010    hospitalized- [facility]  . Hemorrhoids   . HLD (hyperlipidemia)   . History of pneumonia 1969    Past Surgical History  Procedure Laterality Date  . Appendectomy  1974  . Cholecystectomy  2002  . Abdominal hysterectomy  2000    no ovaries, uterus, or cervix.  . Cardiac catheterization  8/11    no significant -[facility]- Dr Mariah Milling,    History   Social History  . Marital Status: Married    Spouse Name: N/A    Number of Children: 3  .  Years of Education: N/A   Occupational History  . office manager Unemployed  .     Social History Main Topics  . Smoking status: Former Games developer  . Smokeless tobacco: Never Used     Comment: quit in 2007-2008, social smoker  . Alcohol Use: Yes     Comment: rarely  . Drug Use: No  . Sexual Activity: Not on file   Other Topics Concern  . Not on file   Social History Narrative   No reg exercise    Family History  Problem Relation Age of Onset  . Breast cancer Paternal Grandmother   . Lung cancer Paternal Grandfather   . Coronary artery disease Mother   . Diabetes Mother   . ALS Mother   . Other Daughter     gluten  . Hyperlipidemia Father   . Down syndrome Son   . Colitis Maternal Grandfather     Allergies  Allergen Reactions  . Pseudoeph-Doxylamine-Dm-Apap     Allergy to nyquil  . Gluten   . Penicillins     rash    Medication list has been reviewed and updated.  Outpatient Prescriptions Prior to Visit  Medication Sig Dispense Refill  . aspirin 81 MG tablet Take 81 mg by mouth daily.        . fenofibrate (TRICOR) 145 MG tablet TAKE 1 TABLET BY MOUTH EVERY DAY  30 tablet  5  . glucose blood (ACCU-CHEK AVIVA) test strip Use as instructed       . levothyroxine (SYNTHROID, LEVOTHROID) 50 MCG tablet TAKE 1 TABLET BY MOUTH EVERY DAY  30 tablet  6  . nitroGLYCERIN (NITROSTAT) 0.4 MG SL tablet 1 tablet under tongue at onset of chest pain; you may repeat every 5 minutes for up to 3 doses.  25 tablet  12  . pravastatin (PRAVACHOL) 40 MG tablet TAKE 1 TABLET BY MOUTH AT BEDTIME, please schedule an appointment with Dr, having fasting labs prior.  30 tablet  0  . sertraline (ZOLOFT) 50 MG tablet TAKE 1 TABLET (50 MG TOTAL) BY MOUTH DAILY.  30 tablet  5  . topiramate (TOPAMAX) 50 MG tablet TAKE 1 TABLET TWICE DAILY, Please schedule an appointment with Dr, having fasting labs prior.  60 tablet  0  . amitriptyline (ELAVIL) 25 MG tablet Take 1 tablet (25 mg total) by mouth at  bedtime.  30 tablet  3  . lansoprazole (PREVACID) 30 MG capsule Take 30 mg by mouth daily.       No facility-administered medications prior to visit.    Review of Systems:   General: Denies fever, chills, sweats. No significant weight loss. Eyes: Denies blurring,significant itching ENT: Denies earache, sore throat, and hoarseness.  Cardiovascular: Denies chest pains, palpitations, dyspnea on exertion,  Respiratory: Denies cough, dyspnea at rest,wheeezing Breast: no concerns about lumps GI: Denies nausea, vomiting, diarrhea, constipation, change in bowel habits, abdominal pain, melena, hematochezia GU: Denies dysuria, hematuria, urinary hesitancy, nocturia, denies STD risk, no concerns about discharge Musculoskeletal: Denies back pain, joint pain Derm: Denies rash, itching Neuro: Denies  paresthesias, frequent falls, frequent headaches Psych: Denies depression, anxiety Endocrine: Denies cold intolerance, heat intolerance, polydipsia Heme: Denies enlarged lymph nodes Allergy: No hayfever   Physical Examination: BP 106/70  Pulse 74  Temp(Src) 98.4 F (36.9 C) (Oral)  Ht 5\' 4"  (1.626 m)  Wt 170 lb 4 oz (77.225 kg)  BMI 29.21 kg/m2  Ideal Body Weight: Weight in (lb) to have BMI = 25: 145.3   GEN: well developed, well nourished, no acute distress Eyes: conjunctiva and lids normal, PERRLA, EOMI ENT: TM clear, nares clear, oral exam WNL Neck: supple, no lymphadenopathy, no thyromegaly, no JVD Pulm: clear to auscultation and percussion, respiratory effort normal CV: regular rate and rhythm, S1-S2, no murmur, rub or gallop, no bruits Chest: no scars, masses, no lumps BREAST: breast exam declined GI: soft, non-tender; no hepatosplenomegaly, masses; active bowel sounds all quadrants GU: GU exam declined Lymph: no cervical, axillary or inguinal adenopathy MSK: gait normal, muscle tone and strength WNL, no joint swelling, effusions, discoloration, crepitus  SKIN: clear, good  turgor, color WNL, no rashes, lesions, or ulcerations Neuro: normal mental status, normal strength, sensation, and motion Psych: alert; oriented to person, place and time, normally interactive and not anxious or depressed in appearance.  Assessment and Plan:  Routine general medical examination at a health care facility   The patient's preventative maintenance and recommended screening tests for an annual wellness exam were reviewed in full today. Brought up to date unless services declined.  Counselled on the importance of diet, exercise, and its role in overall health and mortality. The patient's FH and SH was reviewed, including their home life, tobacco status, and drug and alcohol status.   Her A1c in her blood sugar have increased slightly, but her A1c is still only a 6.8. She and I discussed it, and for now she is going to work on losing weight, eating better, and work on getting more exercise. Her cholesterol is fairly well controlled. Thyroid is well controlled. Continue all medications as they have been.  Orders Today:  No orders of the defined types were placed in this encounter.    Updated Medication List: (Includes new medications, updates to list, dose adjustments) Meds ordered this encounter  Medications  . topiramate (TOPAMAX) 50 MG tablet    Sig: Take 50 mg by mouth daily. TAKE 1 TABLET TWICE DAILY, Please schedule an appointment with Dr, having  fasting labs prior.    Medications Discontinued: Medications Discontinued During This Encounter  Medication Reason  . topiramate (TOPAMAX) 50 MG tablet      Signed, Joshual Terrio T. Alajiah Dutkiewicz, MD 03/17/2013 4:22 PM

## 2013-03-21 ENCOUNTER — Other Ambulatory Visit: Payer: Self-pay | Admitting: Family Medicine

## 2013-04-07 ENCOUNTER — Other Ambulatory Visit: Payer: Self-pay | Admitting: Family Medicine

## 2013-05-25 ENCOUNTER — Other Ambulatory Visit: Payer: Self-pay | Admitting: Family Medicine

## 2013-06-05 ENCOUNTER — Ambulatory Visit: Payer: PRIVATE HEALTH INSURANCE | Admitting: Family Medicine

## 2013-06-21 ENCOUNTER — Other Ambulatory Visit: Payer: Self-pay | Admitting: Family Medicine

## 2013-08-05 ENCOUNTER — Emergency Department: Payer: Self-pay | Admitting: Emergency Medicine

## 2013-08-05 LAB — CBC
HCT: 39.3 % (ref 35.0–47.0)
HGB: 13.3 g/dL (ref 12.0–16.0)
MCH: 27.5 pg (ref 26.0–34.0)
MCHC: 33.7 g/dL (ref 32.0–36.0)
MCV: 82 fL (ref 80–100)
Platelet: 267 10*3/uL (ref 150–440)
RBC: 4.82 10*6/uL (ref 3.80–5.20)
RDW: 13.7 % (ref 11.5–14.5)
WBC: 8.9 10*3/uL (ref 3.6–11.0)

## 2013-08-05 LAB — BASIC METABOLIC PANEL
Anion Gap: 7 (ref 7–16)
BUN: 15 mg/dL (ref 7–18)
CREATININE: 0.63 mg/dL (ref 0.60–1.30)
Calcium, Total: 8.6 mg/dL (ref 8.5–10.1)
Chloride: 108 mmol/L — ABNORMAL HIGH (ref 98–107)
Co2: 24 mmol/L (ref 21–32)
EGFR (African American): >60
EGFR (Non-African Amer.): >60
Glucose: 108 mg/dL — ABNORMAL HIGH (ref 65–99)
Osmolality: 279 (ref 275–301)
POTASSIUM: 3.7 mmol/L (ref 3.5–5.1)
SODIUM: 139 mmol/L (ref 136–145)

## 2013-08-05 LAB — TROPONIN I: Troponin-I: <0.02 ng/mL

## 2013-08-05 IMAGING — CR DG CHEST 2V
1 series · 2 of 2 positions shown · non-contrast
Comparison: No comparisons

CLINICAL DATA: Shoulder pain.  Jaw pain.

EXAM:
CHEST  2 VIEW

[Series 1: pa · 0.17mm/px · 2 of 2 slices shown]
[im 1/2]
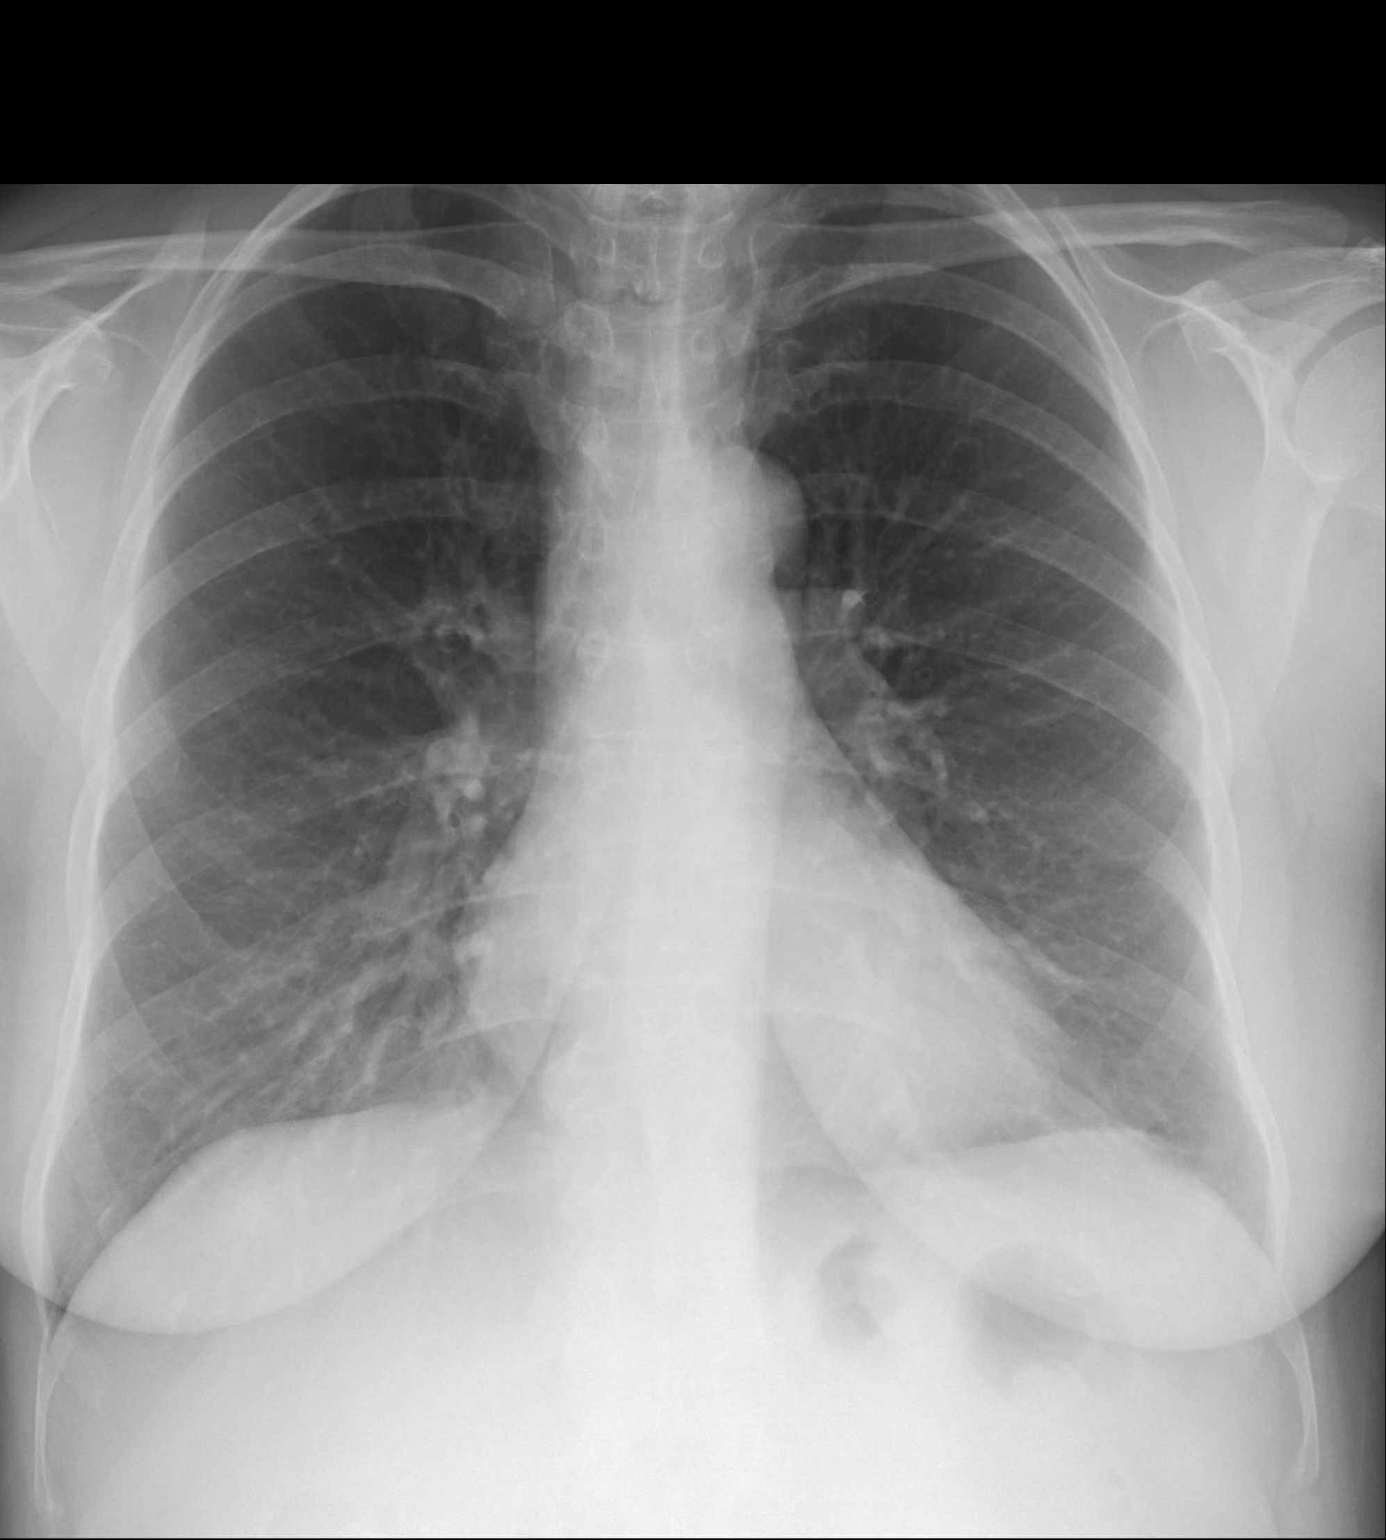
[im 2/2]
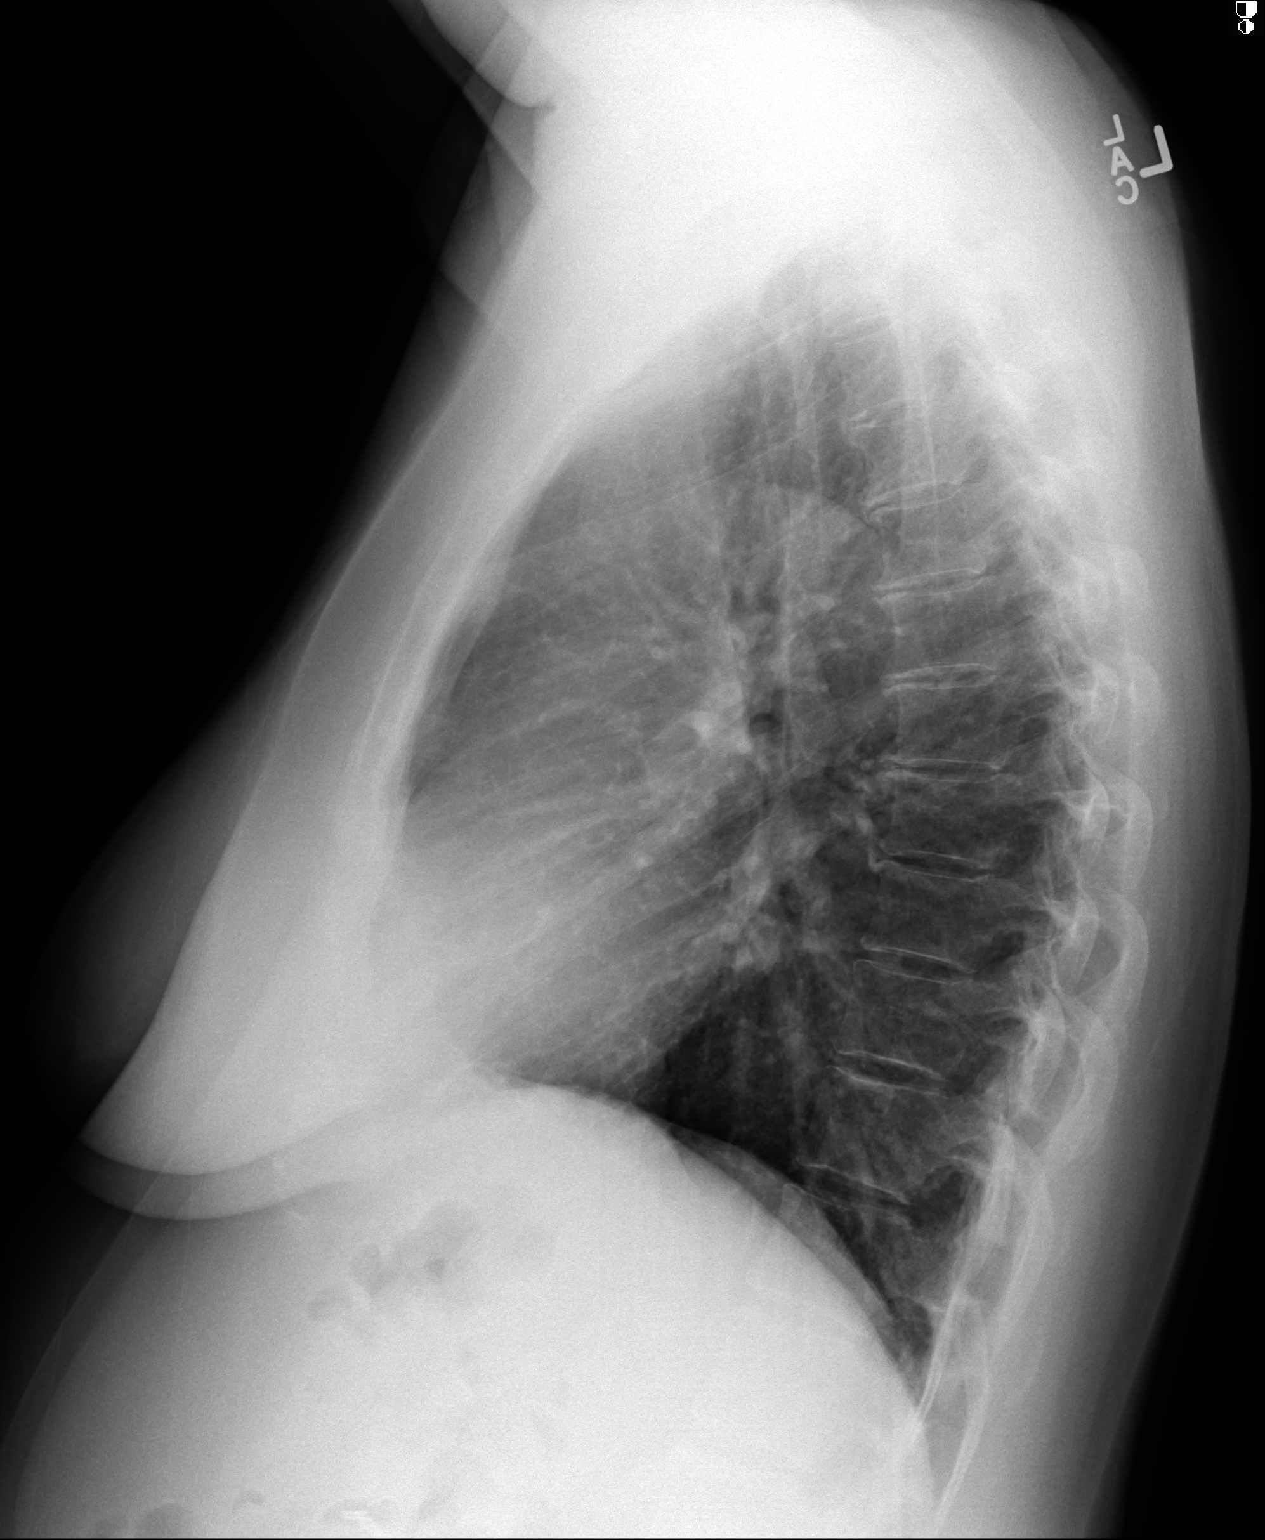

[2 of 2 positions shown; findings below may reference images not displayed]

FINDINGS: Mediastinum hilar structures are normal. Lungs clear. Heart size
normal. Degenerative changes thoracic spine.
IMPRESSION: No active cardiopulmonary disease.

## 2013-08-07 ENCOUNTER — Other Ambulatory Visit: Payer: Self-pay | Admitting: Cardiovascular Disease

## 2013-08-07 ENCOUNTER — Encounter: Payer: Self-pay | Admitting: Cardiovascular Disease

## 2013-08-07 ENCOUNTER — Ambulatory Visit (INDEPENDENT_AMBULATORY_CARE_PROVIDER_SITE_OTHER): Payer: PRIVATE HEALTH INSURANCE | Admitting: Cardiovascular Disease

## 2013-08-07 VITALS — BP 132/80 | HR 75 | Ht 64.0 in | Wt 170.0 lb

## 2013-08-07 DIAGNOSIS — R0602 Shortness of breath: Secondary | ICD-10-CM | POA: Insufficient documentation

## 2013-08-07 DIAGNOSIS — R079 Chest pain, unspecified: Secondary | ICD-10-CM | POA: Insufficient documentation

## 2013-08-07 DIAGNOSIS — E119 Type 2 diabetes mellitus without complications: Secondary | ICD-10-CM

## 2013-08-07 DIAGNOSIS — I2510 Atherosclerotic heart disease of native coronary artery without angina pectoris: Secondary | ICD-10-CM

## 2013-08-07 DIAGNOSIS — E785 Hyperlipidemia, unspecified: Secondary | ICD-10-CM

## 2013-08-07 LAB — TROPONIN I

## 2013-08-07 MED ORDER — NITROGLYCERIN 0.4 MG SL SUBL: 0.4000 mg | SUBLINGUAL_TABLET | SUBLINGUAL | Status: DC | PRN

## 2013-08-07 MED ORDER — FUROSEMIDE 20 MG PO TABS: 20.0000 mg | ORAL_TABLET | Freq: Every day | ORAL | Status: DC

## 2013-08-07 NOTE — Patient Instructions (Addendum)
We will check your labs today (a troponin)  Please try lasix as needed 1 to 2 in the morning Take with potassium  We will order a stress test for shortness of breath and chest pain No food the morning of the test and no caffeine for 24 hours before  Try NTG  for chest pain symptoms If this helps, call the office, we could try the isosorbide dinitrate (6 hour) or mononitrate (24 hour)   Please call us if you have new issues that need to be addressed before your next appt.

## 2013-08-07 NOTE — Assessment & Plan Note (Signed)
Worsening chest pain over the past 2 days. We have ordered repeat troponin which were like to come back tomorrow. She would like a stress test given prior CAD. This will be ordered today and will likely be done next week. We have suggested she use nitroglycerin for chest pain.  If she does have pain relief with subungual nitroglycerin, we could use isosorbide dinitrate or isosorbide mononitrate. Unable to exclude myocarditis or even pericarditis. History of periodic chest pain, unable to exclude spasm.

## 2013-08-07 NOTE — Assessment & Plan Note (Signed)
We have encouraged continued exercise, careful diet management in an effort to lose weight. 

## 2013-08-07 NOTE — Assessment & Plan Note (Signed)
Recommended that she stay on her current medication regimen.

## 2013-08-07 NOTE — Assessment & Plan Note (Addendum)
Etiology of her shortness of breath is unclear. Stress test ordered. Also suggested she try Lasix when necessary for shortness of breath symptoms

## 2013-08-07 NOTE — Progress Notes (Signed)
Patient ID: Brandi Mahoney, female    DOB: January 05, 1956, 58 y.o.   MRN: [id number]  HPI Comments: Brandi Mahoney is a very pleasant 58 year old woman with hx of  non-ST elevation MI,  elevation of troponin to 17, with cardiac catheterization showing nonobstructive left circumflex disease, presentation felt to be secondary to a viral myocarditis with low normal systolic function by cath, Who had a second admission to [facility] for chest discomfort, felt to be noncardiac with echocardiogram showing normal systolic function who presents for routine followup.  A previous office visit she had an episode of chest pain. Significant stress at home  In followup today, she reports having chest pain in the center of her chest radiating to her left arm starting 2 days ago. General malaise on Monday, while at work on Tuesday felt queasy, lightheaded, had chest pain and left arm pain. She has been having shortness of breath with exertion. She is worried about progression of her CAD. She went to the emergency room on the way home from work. Cardiac enzymes were negative x2 EKG essentially normal. Basic metabolic panel and CBC normal. She was discharged with followup today She continues to have stress at home. Son with Down syndrome She has had poor sleep. She does have obstructive sleep apnea and does not use CPAP.  Cardiac catheterization January 20 2010: mild atherosclerosis in the mid left circumflex after the takeoff of the OM1, mild diffuse LAD disease repeat echocardiogram on March 19, 2010 showing ejection fraction 50-27%, diastolic dysfunction, mild LVH  EKG shows normal sinus rhythm with rate 75 beats minute, no significant ST or T wave changes      Outpatient Encounter Prescriptions as of 08/07/2013  Medication Sig  . amitriptyline (ELAVIL) 25 MG tablet Take 1 tablet (25 mg total) by mouth at bedtime.  Marland Kitchen aspirin 81 MG tablet Take 81 mg by mouth daily.    . fenofibrate (TRICOR) 145 MG tablet  TAKE 1 TABLET BY MOUTH EVERY DAY  . glucose blood (ACCU-CHEK AVIVA) test strip Use as instructed   . lansoprazole (PREVACID) 30 MG capsule Take 30 mg by mouth daily.  Marland Kitchen levothyroxine (SYNTHROID, LEVOTHROID) 50 MCG tablet TAKE 1 TABLET BY MOUTH DAILY  . nitroGLYCERIN (NITROSTAT) 0.4 MG SL tablet 1 tablet under tongue at onset of chest pain; you may repeat every 5 minutes for up to 3 doses.  . pravastatin (PRAVACHOL) 40 MG tablet Take 1 tablet (40 mg total) by mouth at bedtime.  . sertraline (ZOLOFT) 50 MG tablet TAKE 1 TABLET BY MOUTH DAILY  . topiramate (TOPAMAX) 50 MG tablet Take 1 tablet (50 mg total) by mouth 2 (two) times daily.    Review of Systems  Constitutional: Negative.   HENT: Negative.        Jaw pain  Eyes: Negative.   Respiratory: Negative.   Cardiovascular: Negative.   Gastrointestinal: Negative.   Endocrine: Negative.   Musculoskeletal: Negative.   Skin: Negative.   Allergic/Immunologic: Negative.   Neurological: Negative.   Hematological: Negative.   Psychiatric/Behavioral: Negative.   All other systems reviewed and are negative.    BP 132/80  Pulse 75  Ht 5\' 4"  (1.626 m)  Wt 170 lb (77.111 kg)  BMI 29.17 kg/m2  Physical Exam  Nursing note and vitals reviewed. Constitutional: She is oriented to person, place, and time. She appears well-developed and well-nourished.  HENT:  Head: Normocephalic.  Nose: Nose normal.  Mouth/Throat: Oropharynx is clear and moist.  Eyes: Conjunctivae are normal.  Pupils are equal, round, and reactive to light.  Neck: Normal range of motion. Neck supple. No JVD present.  Cardiovascular: Normal rate, regular rhythm, S1 normal, S2 normal, normal heart sounds and intact distal pulses.  Exam reveals no gallop and no friction rub.   No murmur heard. Pulmonary/Chest: Effort normal and breath sounds normal. No respiratory distress. She has no wheezes. She has no rales. She exhibits no tenderness.  Abdominal: Soft. Bowel sounds are  normal. She exhibits no distension. There is no tenderness.  Musculoskeletal: Normal range of motion. She exhibits no edema and no tenderness.  Lymphadenopathy:    She has no cervical adenopathy.  Neurological: She is alert and oriented to person, place, and time. Coordination normal.  Skin: Skin is warm and dry. No rash noted. No erythema.  Psychiatric: She has a normal mood and affect. Her behavior is normal. Judgment and thought content normal.    Assessment and Plan

## 2013-08-08 ENCOUNTER — Telehealth: Payer: Self-pay

## 2013-08-08 NOTE — Telephone Encounter (Signed)
Spoke w/ pt.  Advised her of ETT myoview sched for 08/12/13 @ 7:30am, pt to arrive at medical mall entrance of [facility] at 7:15am. Reviewed instructions w/ pt.  She is verbalizes understanding and is agreeable to this.

## 2013-08-10 LAB — CULTURE, BLOOD (SINGLE)

## 2013-08-12 ENCOUNTER — Ambulatory Visit: Payer: Self-pay | Admitting: Cardiovascular Disease

## 2013-08-12 DIAGNOSIS — R079 Chest pain, unspecified: Secondary | ICD-10-CM

## 2013-08-13 ENCOUNTER — Other Ambulatory Visit: Payer: Self-pay

## 2013-08-13 ENCOUNTER — Telehealth: Payer: Self-pay

## 2013-08-13 DIAGNOSIS — R0602 Shortness of breath: Secondary | ICD-10-CM

## 2013-08-13 DIAGNOSIS — I2510 Atherosclerotic heart disease of native coronary artery without angina pectoris: Secondary | ICD-10-CM

## 2013-08-13 DIAGNOSIS — R079 Chest pain, unspecified: Secondary | ICD-10-CM

## 2013-08-13 NOTE — Telephone Encounter (Signed)
Pt states that she had a stress test yesterday, and asks what the next step is. Please call.

## 2013-08-13 NOTE — Telephone Encounter (Signed)
Awaiting results

## 2013-08-15 NOTE — Telephone Encounter (Signed)
Reviewed results w/ pt that per Dr. Rockey Situ, her stress test was "beautiful" and that her EF was very good. Pt is very upset and asks to speak w/ Dr. Rockey Situ, "why would the PA tell me something was wrong if it wasn't?" Offered to schedule pt an appt to come in and see Dr. Rockey Situ on Monday.   Pt asks if she could schedule her appt but send her husband in her place. Advised that that due to ins purposes, she would need to be present at her appt. Pt states that she will talk to her husband and call back on Monday, if needed.

## 2013-08-15 NOTE — Telephone Encounter (Signed)
Uncertain what was said at the time of a stress test Stress test was normal Based on a stress test, no further testing needed It was good perfusion both at rest and stress indicating no significant stenosis Good wall motion, normal ejection fraction

## 2013-08-15 NOTE — Telephone Encounter (Signed)
Spoke w/ pt.  She states that when she had her stress test done, "the PA did not like what he saw". She states that her BP jumped during the test and she was told she may need a cath. She would like a call back today to know how Dr. Rockey Situ would like to proceed. Please advise.  Thank you!

## 2013-08-15 NOTE — Telephone Encounter (Signed)
Pt's husband left message on vm stating that pt is very antsy and cannot sleep, as she is worried about the results of her stress test done last Tuesday at [facility]. They would appreciate a call today so that she won't have to worry about it over the weekend.

## 2013-08-18 ENCOUNTER — Encounter: Payer: Self-pay | Admitting: Cardiovascular Disease

## 2013-08-18 ENCOUNTER — Ambulatory Visit (INDEPENDENT_AMBULATORY_CARE_PROVIDER_SITE_OTHER): Payer: PRIVATE HEALTH INSURANCE | Admitting: Cardiovascular Disease

## 2013-08-18 VITALS — BP 128/72 | HR 83 | Wt 172.5 lb

## 2013-08-18 DIAGNOSIS — E785 Hyperlipidemia, unspecified: Secondary | ICD-10-CM

## 2013-08-18 DIAGNOSIS — E119 Type 2 diabetes mellitus without complications: Secondary | ICD-10-CM

## 2013-08-18 DIAGNOSIS — R079 Chest pain, unspecified: Secondary | ICD-10-CM

## 2013-08-18 DIAGNOSIS — R0602 Shortness of breath: Secondary | ICD-10-CM

## 2013-08-18 NOTE — Assessment & Plan Note (Signed)
Etiology of her shortness of breath likely from deconditioning, recent weight gain. We recommended she start a exercise program

## 2013-08-18 NOTE — Patient Instructions (Signed)
You are doing well. No medication changes were made.  Please call the office for chest pain We could try isosorbide dinitrate for chest pain  Please call us if you have new issues that need to be addressed before your next appt.  Your physician wants you to follow-up in: 6 months.  You will receive a reminder letter in the mail two months in advance. If you don't receive a letter, please call our office to schedule the follow-up appointment.  '

## 2013-08-18 NOTE — Assessment & Plan Note (Signed)
Symptoms of chest discomfort and shortness of breath, likely not secondary to ischemia. Recent negative stress test. Recommended she start a regular exercise program

## 2013-08-18 NOTE — Progress Notes (Signed)
Patient ID: Brandi Mahoney, female    DOB: [birth year], 57 y.o.   MRN: [id number]  HPI Comments: Brandi Mahoney is a very pleasant 58 year old woman with hx of  non-ST elevation MI,  elevation of troponin to 17, with cardiac catheterization showing nonobstructive left circumflex disease, presentation felt to be secondary to a viral myocarditis with low normal systolic function by cath, Who had a second admission to [facility] for chest discomfort, felt to be noncardiac with echocardiogram showing normal systolic function who presents for routine followup.  A previous office visit she had an episode of chest pain. Significant stress at home  On her last clinic visit, she reported having chest pain in the center of her chest radiating to her left arm starting 58 days ago. General malaise on Monday, while at work on Tuesday felt queasy, lightheaded, had chest pain and left arm pain. She has been having shortness of breath with exertion. She is worried about progression of her CAD. She went to the emergency room on the way home from work. Cardiac enzymes were negative x2 EKG essentially normal. Basic metabolic panel and CBC normal. She was discharged She continues to have stress at home. Son with Down syndrome She has had poor sleep. She does have obstructive sleep apnea and does not use CPAP.  Given her chest pain a stress test was performed. This was done last week that showed no ischemia. She had good exercise tolerance though on a treadmill did develop left chest and arm pain. She achieved target heart rate. EKG was essentially normal with no significant changes concerning for ischemia. No perfusion abnormality.   Since then she has very occasional episodes of left arm, left chest pain, not typically associated with exertion  Cardiac catheterization January 20 2010: mild atherosclerosis in the mid left circumflex after the takeoff of the OM1, mild diffuse LAD disease repeat echocardiogram on  March 19, 2010 showing ejection fraction 44-81%, diastolic dysfunction, mild LVH  EKG shows normal sinus rhythm with rate 83 beats minute, no significant ST or T wave changes      Outpatient Encounter Prescriptions as of 08/18/2013  Medication Sig  . amitriptyline (ELAVIL) 25 MG tablet Take 1 tablet (25 mg total) by mouth at bedtime.  Marland Kitchen aspirin 81 MG tablet Take 81 mg by mouth daily.    . fenofibrate (TRICOR) 145 MG tablet TAKE 1 TABLET BY MOUTH EVERY DAY  . furosemide (LASIX) 20 MG tablet Take 1 tablet (20 mg total) by mouth daily.  Marland Kitchen glucose blood (ACCU-CHEK AVIVA) test strip Use as instructed   . lansoprazole (PREVACID) 30 MG capsule Take 30 mg by mouth daily.  Marland Kitchen levothyroxine (SYNTHROID, LEVOTHROID) 50 MCG tablet TAKE 1 TABLET BY MOUTH DAILY  . nitroGLYCERIN (NITROSTAT) 0.4 MG SL tablet 1 tablet under tongue at onset of chest pain; you may repeat every 5 minutes for up to 3 doses.  . nitroGLYCERIN (NITROSTAT) 0.4 MG SL tablet Place 1 tablet (0.4 mg total) under the tongue every 5 (five) minutes as needed for chest pain.  . pravastatin (PRAVACHOL) 40 MG tablet Take 1 tablet (40 mg total) by mouth at bedtime.  . sertraline (ZOLOFT) 50 MG tablet TAKE 1 TABLET BY MOUTH DAILY  . topiramate (TOPAMAX) 50 MG tablet Take 1 tablet (50 mg total) by mouth 2 (two) times daily.    Review of Systems  Constitutional: Negative.   HENT: Negative.        Jaw pain  Eyes: Negative.  Respiratory: Negative.   Cardiovascular: Negative.   Gastrointestinal: Negative.   Endocrine: Negative.   Musculoskeletal: Negative.   Skin: Negative.   Allergic/Immunologic: Negative.   Neurological: Negative.   Hematological: Negative.   Psychiatric/Behavioral: Negative.   All other systems reviewed and are negative.    BP 128/72  Pulse 83  Wt 172 lb 8 oz (78.245 kg)  Physical Exam  Nursing note and vitals reviewed. Constitutional: She is oriented to person, place, and time. She appears well-developed and  well-nourished.  HENT:  Head: Normocephalic.  Nose: Nose normal.  Mouth/Throat: Oropharynx is clear and moist.  Eyes: Conjunctivae are normal. Pupils are equal, round, and reactive to light.  Neck: Normal range of motion. Neck supple. No JVD present.  Cardiovascular: Normal rate, regular rhythm, S1 normal, S2 normal, normal heart sounds and intact distal pulses.  Exam reveals no gallop and no friction rub.   No murmur heard. Pulmonary/Chest: Effort normal and breath sounds normal. No respiratory distress. She has no wheezes. She has no rales. She exhibits no tenderness.  Abdominal: Soft. Bowel sounds are normal. She exhibits no distension. There is no tenderness.  Musculoskeletal: Normal range of motion. She exhibits no edema and no tenderness.  Lymphadenopathy:    She has no cervical adenopathy.  Neurological: She is alert and oriented to person, place, and time. Coordination normal.  Skin: Skin is warm and dry. No rash noted. No erythema.  Psychiatric: She has a normal mood and affect. Her behavior is normal. Judgment and thought content normal.    Assessment and Plan

## 2013-08-18 NOTE — Assessment & Plan Note (Signed)
Cholesterol is at goal on the current lipid regimen. No changes to the medications were made.  

## 2013-08-18 NOTE — Assessment & Plan Note (Signed)
We have encouraged continued exercise, careful diet management in an effort to lose weight. 

## 2013-09-10 ENCOUNTER — Telehealth: Payer: Self-pay | Admitting: Family Medicine

## 2013-09-10 NOTE — Telephone Encounter (Signed)
error 

## 2013-09-18 ENCOUNTER — Ambulatory Visit (INDEPENDENT_AMBULATORY_CARE_PROVIDER_SITE_OTHER): Payer: PRIVATE HEALTH INSURANCE | Admitting: Family Medicine

## 2013-09-18 ENCOUNTER — Encounter: Payer: Self-pay | Admitting: Family Medicine

## 2013-09-18 VITALS — BP 100/70 | HR 72 | Temp 98.3°F | Ht 64.0 in | Wt 170.8 lb

## 2013-09-18 DIAGNOSIS — F17201 Nicotine dependence, unspecified, in remission: Secondary | ICD-10-CM

## 2013-09-18 DIAGNOSIS — Z87891 Personal history of nicotine dependence: Secondary | ICD-10-CM

## 2013-09-18 DIAGNOSIS — R0602 Shortness of breath: Secondary | ICD-10-CM

## 2013-09-18 DIAGNOSIS — I214 Non-ST elevation (NSTEMI) myocardial infarction: Secondary | ICD-10-CM

## 2013-09-18 DIAGNOSIS — G4733 Obstructive sleep apnea (adult) (pediatric): Secondary | ICD-10-CM

## 2013-09-18 NOTE — Patient Instructions (Signed)
REFERRALS TO SPECIALISTS, SPECIAL TESTS (MRI, CT, ULTRASOUNDS)  GO THE WAITING ROOM AND TELL CHECK IN YOU NEED HELP WITH A REFERRAL. Either [facility] or [facility] will help you set it up.  If it is between 1-2 PM they may be at lunch.  After 5 PM, they will likely be at home.  They will call you, so please make sure the office has your correct phone number.  Referrals sometimes can be done same day if urgent, but others can take 2 or 3 days to get an appointment. Starting in 2015, many of the new Medicare insurance plans and Affordable Care Act (Obamacare) Health plans offered take much longer for referrals. They have added additional paperwork and steps.  MRI's and CT's can take up to a week for the test. (Emergencies like strokes take precedence. I will tell you if you have an emergency.)   If your referral is to an in-network [place] office, their office may contact you directly prior to our office reaching you.  -- Examples: [facility], [facility], [facility], [place]            [facility], [facility], and many more.  Specialist appointment times vary a great deal, mostly on the specialist's schedule and if they have openings. -- Our office tries to get you in as fast as possible. -- Some specialists have very long wait times. (Example. Dermatology. Usually months) -- If you have a true emergency like new cancer, we work to get you in ASAP.   

## 2013-09-18 NOTE — Progress Notes (Signed)
Pre visit review using our clinic review tool, if applicable. No additional management support is needed unless otherwise documented below in the visit note. 

## 2013-09-18 NOTE — Progress Notes (Signed)
Date:  09/18/2013   Name:  Montserrath Madding   DOB:  1955/07/29   MRN:  [id number]  Primary Physician:  Owens Loffler, MD   Chief Complaint: Snoring   Subjective:   History of Present Illness:  Brandi Mahoney is a 58 y.o. very pleasant female patient who presents with the following:  As discussed. Approximately 10 years ago she had a sleep study done in [place], and she had some mild sleep apnea at that point. She has gained some weight, and her husband is concerned and she is going to the point where he is wearing ear plugs at all times. She does not feel very well rested during the daytime.  She also had some chest pain and shortness of breath as well as pain in her LEFT arm back in February 2015. She went to the emergency room, was ruled out for myocardial infarction, and ultimately followed up with Dr. Rockey Situ, and she had a normal nuclear stress test. This was a low risk study with a normal ejection fraction.  35 years, off and on smoker.  < 1/2 ppd. She is still been having some intermittent shortness of breath and difficulty walking. She also still has some intermittent pain occasionally in her arm when she walks. She also was a smoker for about 35 years, and at one point in her life she was told that she had some mild asthma.  Check spirometry.  Past Medical History, Surgical History, Social History, Family History, Problem List, Medications, and Allergies have been reviewed and updated if relevant.  Review of Systems:  GEN: No acute illnesses, no fevers, chills. GI: No n/v/d, eating normally Pulm: + SOB Interactive and getting along well at home.  Otherwise, ROS is as per the HPI.  Objective:   Physical Examination: BP 100/70  Pulse 72  Temp(Src) 98.3 F (36.8 C) (Oral)  Ht 5\' 4"  (1.626 m)  Wt 170 lb 12 oz (77.452 kg)  BMI 29.29 kg/m2  SpO2 96%   GEN: WDWN, NAD, Non-toxic, A & O x 3 HEENT: Atraumatic, Normocephalic. Neck supple. No masses, No LAD. Ears and  Nose: No external deformity. CV: RRR, No M/G/R. No JVD. No thrill. No extra heart sounds. PULM: CTA B, no wheezes, crackles, rhonchi. No retractions. No resp. distress. No accessory muscle use. EXTR: No c/c/e NEURO Normal gait.  PSYCH: Normally interactive. Conversant. Not depressed or anxious appearing.  Calm demeanor.   Laboratory and Imaging Data:  Assessment & Plan:   SOB (shortness of breath) - Plan: Spirometry with graph  OSA (obstructive sleep apnea) - Plan: Ambulatory referral to Pulmonology  Tobacco abuse, in remission  NSTEMI (non-ST elevated myocardial infarction), 01/2010 [facility]   >25 minutes spent in face to face time with patient, >50% spent in counselling or coordination of care: certainly is appropriate to obtain a sleep evaluation for sleep apnea. She at least had mild symptoms on prior sleep study 10 years ago, and very likely it sounds as if this is progressively gotten worse.  She does have some shortness of breath and she has a significant tobacco history. I checked spirometry today, which was normal, suggesting that this is not come in for chronic obstructive pulmonary disease, emphysema, or asthma.  With a normal nuclear stress test that is reassuring. The patient does have multiple cardiac risk factors including diabetes, hyperlipidemia, hypertriglyceridemia, obesity, prior NSTEMI, and tobacco abuse. She is already discussed with her cardiologist.  I suspect that this is most likely multifactorial. She is certainly  deconditioned. We'll initiate evaluation and treatment for sleep apnea. This may improve her symptoms. If chest pain does continue with exertion, however, she may need to discuss this again with Dr. Rockey Situ.  No Follow-up on file.  Orders Placed This Encounter  Procedures  . Ambulatory referral to Pulmonology  . Spirometry with graph   Patient's Medications  New Prescriptions   No medications on file  Previous Medications   ASPIRIN 81 MG TABLET     Take 81 mg by mouth daily.     FENOFIBRATE (TRICOR) 145 MG TABLET    TAKE 1 TABLET BY MOUTH EVERY DAY   GLUCOSE BLOOD (ACCU-CHEK AVIVA) TEST STRIP    Use as instructed    LANSOPRAZOLE (PREVACID) 30 MG CAPSULE    Take 30 mg by mouth daily.   LEVOTHYROXINE (SYNTHROID, LEVOTHROID) 50 MCG TABLET    TAKE 1 TABLET BY MOUTH DAILY   NITROGLYCERIN (NITROSTAT) 0.4 MG SL TABLET    1 tablet under tongue at onset of chest pain; you may repeat every 5 minutes for up to 3 doses.   PRAVASTATIN (PRAVACHOL) 40 MG TABLET    Take 1 tablet (40 mg total) by mouth at bedtime.   SERTRALINE (ZOLOFT) 50 MG TABLET    TAKE 1 TABLET BY MOUTH DAILY   TOPIRAMATE (TOPAMAX) 50 MG TABLET    Take 1 tablet (50 mg total) by mouth 2 (two) times daily.  Modified Medications   No medications on file  Discontinued Medications   FUROSEMIDE (LASIX) 20 MG TABLET    Take 1 tablet (20 mg total) by mouth daily.   Patient Instructions  REFERRALS TO SPECIALISTS, SPECIAL TESTS (MRI, CT, ULTRASOUNDS)  GO THE WAITING ROOM AND TELL CHECK IN YOU NEED HELP WITH A REFERRAL. Either [facility] or [place] will help you set it up.  If it is between 1-2 PM they may be at lunch.  After 5 PM, they will likely be at home.  They will call you, so please make sure the office has your correct phone number.  Referrals sometimes can be done same day if urgent, but others can take 2 or 3 days to get an appointment. Starting in 2015, many of the new Medicare insurance plans and [place] (Obamacare) Health plans offered take much longer for referrals. They have added additional paperwork and steps.  MRI's and CT's can take up to a week for the test. (Emergencies like strokes take precedence. I will tell you if you have an emergency.)   If your referral is to an [facility] office, their office may contact you directly prior to our office reaching you.  -- Examples: [facility], [facility], [place], [place]             [facility], [facility], and many more.  Specialist appointment times vary a great deal, mostly on the specialist's schedule and if they have openings. -- Our office tries to get you in as fast as possible. -- Some specialists have very long wait times. (Example. Dermatology. Usually months) -- If you have a true emergency like new cancer, we work to get you in ASAP.     Signed,  Maud Deed. Kaniyah Lisby, MD, [facility] [place] [address] Phone: [phone] Fax: [phone]

## 2013-09-19 ENCOUNTER — Encounter: Payer: Self-pay | Admitting: Family Medicine

## 2013-09-19 DIAGNOSIS — F17201 Nicotine dependence, unspecified, in remission: Secondary | ICD-10-CM | POA: Insufficient documentation

## 2013-09-25 ENCOUNTER — Encounter: Payer: Self-pay | Admitting: Internal Medicine

## 2013-09-25 ENCOUNTER — Ambulatory Visit (INDEPENDENT_AMBULATORY_CARE_PROVIDER_SITE_OTHER): Payer: PRIVATE HEALTH INSURANCE | Admitting: Internal Medicine

## 2013-09-25 VITALS — BP 124/60 | HR 79 | Ht 64.0 in | Wt 172.4 lb

## 2013-09-25 DIAGNOSIS — G4733 Obstructive sleep apnea (adult) (pediatric): Secondary | ICD-10-CM | POA: Insufficient documentation

## 2013-09-25 NOTE — Patient Instructions (Signed)
Order- [place] unattended sleep study for dx OSA

## 2013-09-25 NOTE — Assessment & Plan Note (Signed)
High probability for at least mild obstructive sleep apnea. She needs updated sleep study. Plan- Unattended home study if she qualifies. Otherwise we will schedule formal NPSG. Basic discussion of sleep hygiene.

## 2013-09-25 NOTE — Progress Notes (Signed)
09/25/13- Brandi Mahoney former smoker referred courtesy of  Dr Kathe Mariner sleep study in [place] about 8 years ago; not on CPAP  Was told then she had mild sleep apnea and was not treated. Now family complaining her snoring is very loud. Admits daytime tiredness. Difficulty breathing comfortably while on Left side, but she denies hx of cardiopulmonary disease or ENT surgery. Has been treated for hypothyroid. 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 No family OSA hx. Bedtime 10:30- 11:00 PM, sleep latency 15 minutes, waking once before up 5:45 AM.  Prior to Admission medications   Medication Sig Start Date End Date Taking? Authorizing Provider  aspirin 81 MG tablet Take 81 mg by mouth daily.      Historical Provider, MD  fenofibrate (TRICOR) 145 MG tablet TAKE 1 TABLET BY MOUTH EVERY DAY 01/13/13   Owens Loffler, MD  glucose blood (ACCU-CHEK AVIVA) test strip Use as instructed     Historical Provider, MD  lansoprazole (PREVACID) 30 MG capsule Take 30 mg by mouth daily.    Historical Provider, MD  levothyroxine (SYNTHROID, LEVOTHROID) 50 MCG tablet TAKE 1 TABLET BY MOUTH DAILY 04/07/13   Owens Loffler, MD  nitroGLYCERIN (NITROSTAT) 0.4 MG SL tablet 1 tablet under tongue at onset of chest pain; you may repeat every 5 minutes for up to 3 doses. 07/21/11   Tonia Ghent, MD  pravastatin (PRAVACHOL) 40 MG tablet Take 1 tablet (40 mg total) by mouth at bedtime. 05/25/13   Owens Loffler, MD  sertraline (ZOLOFT) 50 MG tablet TAKE 1 TABLET BY MOUTH DAILY 06/21/13   Owens Loffler, MD  topiramate (TOPAMAX) 50 MG tablet Take 1 tablet (50 mg total) by mouth 2 (two) times daily. 03/21/13   Owens Loffler, MD   Past Medical History  Diagnosis Date  . Asthma   . Depression   . Diabetes mellitus     type II  . Migraine headache   . IBS (irritable bowel syndrome)   . Hypothyroidism   . Myocarditis 01/21/2010    hospitalized- [facility]  . Hemorrhoids   . HLD (hyperlipidemia)   . History of pneumonia 1969   . 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 Appendectomy  1974  . Cholecystectomy  2002  . Total abdominal hysterectomy w/ bilateral salpingoophorectomy  2000    no ovaries, uterus, or cervix.  . Cardiac catheterization  8/11    no significant -[facility] Dr Rockey Situ,  . Breast biopsy  1988   Family History  Problem Relation Age of Onset  . Breast cancer Paternal Grandmother   . Lung cancer Paternal Grandfather     CAD  . Coronary artery disease Mother   . Diabetes Mother   . ALS Mother   . Other Daughter     gluten  . Hyperlipidemia Father   . Down syndrome Son   . Colon cancer Maternal Grandfather    History   Social History  . Marital Status: Married    Spouse Name: Nicole Kindred    Number of Children: 3  . Years of Education: N/A   Occupational History  . 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     Social History Main Topics  . Smoking status: Former Smoker -- 0.35 packs/day for 35 years    Types: Cigarettes    Quit date: 09/25/2008  . Smokeless  tobacco: Never Used     Comment: quit in 2007-2008, social smoker  . Alcohol Use: Yes     Comment: rarely  . Drug Use: No  . Sexual Activity: Not on file   Other Topics Concern  . Not on file   Social History Narrative   No reg exercise   ROS-see HPI Constitutional:   No-   weight loss, night sweats, fevers, chills, +fatigue, lassitude. HEENT:   No-  headaches, difficulty swallowing, tooth/dental problems, sore throat,       No-  sneezing, itching, ear ache, nasal congestion, post nasal drip,  CV:  No-   chest pain, orthopnea, PND, swelling in lower extremities, anasarca,                                  dizziness, palpitations Resp: + shortness of breath with exertion or at rest.              No-   productive cough,  No non-productive cough,  No- coughing up of blood.               No-   change in color of mucus.  No- wheezing.   Skin: No-   rash or lesions. GI:  No-   heartburn, indigestion, abdominal pain, nausea, vomiting, diarrhea,                 change in bowel habits, loss of appetite GU: No-   dysuria, change in color of urine, no urgency or frequency.  No- flank pain. MS:  No-   joint pain or swelling.  No- decreased range of motion.  No- back pain. Neuro-     nothing unusual Psych:  No- change in mood or affect. No depression or anxiety.  No memory loss.  OBJ- Physical Exam General- Alert, Oriented, Affect-appropriate, Distress- none acute. Medium build Skin- rash-none, lesions- none, excoriation- none Lymphadenopathy- none Head- atraumatic            Eyes- Gross vision intact, PERRLA, conjunctivae and secretions clear            Ears- Hearing, canals-normal            Nose- Clear, no-Septal dev, mucus, polyps, erosion, perforation             Throat- Mallampati III , mucosa clear , drainage- none, tonsils- atrophic Neck- flexible , trachea midline, no stridor , thyroid nl, carotid no bruit Chest - symmetrical excursion , unlabored           Heart/CV- RRR , no murmur , no gallop  , no rub, nl s1 s2                           - JVD- none , edema- none, stasis changes- none, varices- none           Lung- clear to P&A, wheeze- none, cough- none , dullness-none, rub- none           Chest wall-  Abd- tender-no, distended-no, bowel sounds-present, HSM- no Br/ Gen/ Rectal- Not done, not indicated Extrem- cyanosis- none, clubbing, none, atrophy- none, strength- nl Neuro- grossly intact to observation

## 2013-09-29 ENCOUNTER — Other Ambulatory Visit: Payer: Self-pay | Admitting: Family Medicine

## 2013-10-06 ENCOUNTER — Telehealth: Payer: Self-pay | Admitting: Internal Medicine

## 2013-10-06 NOTE — Telephone Encounter (Signed)
Will forward to Dr. Young as an FYI 

## 2013-11-01 DIAGNOSIS — G4733 Obstructive sleep apnea (adult) (pediatric): Secondary | ICD-10-CM

## 2013-11-04 ENCOUNTER — Ambulatory Visit: Payer: PRIVATE HEALTH INSURANCE | Admitting: Internal Medicine

## 2013-11-04 ENCOUNTER — Encounter: Payer: Self-pay | Admitting: Internal Medicine

## 2013-11-04 DIAGNOSIS — G4733 Obstructive sleep apnea (adult) (pediatric): Secondary | ICD-10-CM

## 2013-11-28 ENCOUNTER — Other Ambulatory Visit: Payer: Self-pay | Admitting: Family Medicine

## 2014-02-25 ENCOUNTER — Other Ambulatory Visit: Payer: Self-pay | Admitting: *Deleted

## 2014-02-25 MED ORDER — PRAVASTATIN SODIUM 40 MG PO TABS: 40.0000 mg | ORAL_TABLET | Freq: Every day | ORAL | Status: DC

## 2014-03-05 ENCOUNTER — Other Ambulatory Visit: Payer: Self-pay | Admitting: Family Medicine

## 2014-04-06 ENCOUNTER — Other Ambulatory Visit: Payer: Self-pay | Admitting: Family Medicine

## 2014-04-07 ENCOUNTER — Telehealth: Payer: Self-pay | Admitting: Family Medicine

## 2014-04-07 NOTE — Telephone Encounter (Signed)
LVM for patient to call back and schedule CPE within the next 3 months with Dr. Lorelei Pont.

## 2014-04-16 NOTE — Telephone Encounter (Signed)
LVM again on 04/16/14

## 2014-04-30 NOTE — Telephone Encounter (Signed)
LVM again on 04/30/14

## 2014-05-11 ENCOUNTER — Other Ambulatory Visit: Payer: Self-pay | Admitting: Family Medicine

## 2014-05-12 ENCOUNTER — Telehealth: Payer: Self-pay | Admitting: Family Medicine

## 2014-05-12 NOTE — Telephone Encounter (Signed)
Left message asking pt to call office  Please schedule below appointment   Please call and scheduled CPE with fasting labs prior with Dr. Lorelei Pont.   Thanks,  Butch Penny

## 2014-05-18 NOTE — Telephone Encounter (Signed)
Left message asking pt to call office  °

## 2014-05-19 NOTE — Telephone Encounter (Signed)
Pt called back she stated she would have to check her work schedule and give [place] call back to r/s

## 2014-06-05 ENCOUNTER — Encounter: Payer: Self-pay | Admitting: *Deleted

## 2014-06-13 ENCOUNTER — Other Ambulatory Visit: Payer: Self-pay | Admitting: Family Medicine

## 2014-06-22 ENCOUNTER — Ambulatory Visit: Payer: Self-pay | Admitting: General Surgery

## 2014-06-25 ENCOUNTER — Encounter: Payer: Self-pay | Admitting: *Deleted

## 2014-07-27 ENCOUNTER — Telehealth: Payer: Self-pay | Admitting: Family Medicine

## 2014-07-28 NOTE — Telephone Encounter (Signed)
Please schedule 15 minute follow-up office appointment with Dr. Lorelei Pont.  Overdue, labs multiple chronic issues.

## 2014-07-28 NOTE — Telephone Encounter (Signed)
Last office visit 09/18/2013 for Snoring.  Last CPE and TSH was 03/12/2013.  Last refill stated needs physical prior to any more refills.  No future appointments scheduled.  Refill?

## 2014-07-28 NOTE — Telephone Encounter (Signed)
Ok to refill only #30, 0 refills.  15 minute follow-up office appt needed. Overdue, labs multiple chronic issues.

## 2014-07-28 NOTE — Telephone Encounter (Signed)
Let message asking pt call office please schedule appointment

## 2014-08-04 NOTE — Telephone Encounter (Signed)
Appointment 3/3 Please close

## 2014-08-06 ENCOUNTER — Ambulatory Visit (INDEPENDENT_AMBULATORY_CARE_PROVIDER_SITE_OTHER): Payer: PRIVATE HEALTH INSURANCE | Admitting: Family Medicine

## 2014-08-06 ENCOUNTER — Telehealth: Payer: Self-pay

## 2014-08-06 ENCOUNTER — Encounter: Payer: Self-pay | Admitting: Family Medicine

## 2014-08-06 VITALS — BP 110/82 | HR 75 | Temp 98.1°F | Ht 64.0 in | Wt 170.8 lb

## 2014-08-06 DIAGNOSIS — R0602 Shortness of breath: Secondary | ICD-10-CM | POA: Diagnosis not present

## 2014-08-06 DIAGNOSIS — E781 Pure hyperglyceridemia: Secondary | ICD-10-CM | POA: Diagnosis not present

## 2014-08-06 DIAGNOSIS — R1011 Right upper quadrant pain: Secondary | ICD-10-CM

## 2014-08-06 DIAGNOSIS — R1013 Epigastric pain: Secondary | ICD-10-CM | POA: Insufficient documentation

## 2014-08-06 DIAGNOSIS — E785 Hyperlipidemia, unspecified: Secondary | ICD-10-CM | POA: Diagnosis not present

## 2014-08-06 DIAGNOSIS — N200 Calculus of kidney: Secondary | ICD-10-CM

## 2014-08-06 DIAGNOSIS — E039 Hypothyroidism, unspecified: Secondary | ICD-10-CM

## 2014-08-06 LAB — CBC WITH DIFFERENTIAL/PLATELET
BASOS ABS: 0 10*3/uL (ref 0.0–0.1)
BASOS PCT: 0.5 % (ref 0.0–3.0)
EOS ABS: 0.2 10*3/uL (ref 0.0–0.7)
Eosinophils Relative: 2.5 % (ref 0.0–5.0)
HEMATOCRIT: 41.3 % (ref 36.0–46.0)
Hemoglobin: 13.7 g/dL (ref 12.0–15.0)
LYMPHS PCT: 29.5 % (ref 12.0–46.0)
Lymphs Abs: 2.5 10*3/uL (ref 0.7–4.0)
MCHC: 33.2 g/dL (ref 30.0–36.0)
MCV: 80.9 fl (ref 78.0–100.0)
MONOS PCT: 6.2 % (ref 3.0–12.0)
Monocytes Absolute: 0.5 10*3/uL (ref 0.1–1.0)
NEUTROS PCT: 61.3 % (ref 43.0–77.0)
Neutro Abs: 5.1 10*3/uL (ref 1.4–7.7)
Platelets: 279 10*3/uL (ref 150.0–400.0)
RBC: 5.1 Mil/uL (ref 3.87–5.11)
RDW: 13.5 % (ref 11.5–15.5)
WBC: 8.4 10*3/uL (ref 4.0–10.5)

## 2014-08-06 LAB — COMPREHENSIVE METABOLIC PANEL
ALBUMIN: 4.5 g/dL (ref 3.5–5.2)
ALT: 40 U/L — AB (ref 0–35)
AST: 35 U/L (ref 0–37)
Alkaline Phosphatase: 72 U/L (ref 39–117)
BUN: 12 mg/dL (ref 6–23)
CO2: 28 mEq/L (ref 19–32)
Calcium: 9.5 mg/dL (ref 8.4–10.5)
Chloride: 106 mEq/L (ref 96–112)
Creatinine, Ser: 0.83 mg/dL (ref 0.40–1.20)
GFR: 74.99 mL/min (ref >60.00)
Glucose, Bld: 101 mg/dL — ABNORMAL HIGH (ref 70–99)
POTASSIUM: 3.9 meq/L (ref 3.5–5.1)
Sodium: 139 mEq/L (ref 135–145)
TOTAL PROTEIN: 7.4 g/dL (ref 6.0–8.3)
Total Bilirubin: 0.5 mg/dL (ref 0.2–1.2)

## 2014-08-06 LAB — LIPASE: LIPASE: 27 U/L (ref 11.0–59.0)

## 2014-08-06 LAB — LIPID PANEL
CHOL/HDL RATIO: 4
CHOLESTEROL: 173 mg/dL (ref 0–200)
HDL: 42.7 mg/dL (ref >39.00)
NONHDL: 130.3
TRIGLYCERIDES: 279 mg/dL — AB (ref 0.0–149.0)
VLDL: 55.8 mg/dL — AB (ref 0.0–40.0)

## 2014-08-06 LAB — HEPATITIS PANEL, ACUTE
HCV Ab: NEGATIVE
Hep A IgM: NONREACTIVE
Hep B C IgM: NONREACTIVE
Hepatitis B Surface Ag: NEGATIVE

## 2014-08-06 LAB — TSH: TSH: 2.37 u[IU]/mL (ref 0.35–4.50)

## 2014-08-06 LAB — LDL CHOLESTEROL, DIRECT: LDL DIRECT: 90 mg/dL

## 2014-08-06 NOTE — Assessment & Plan Note (Signed)
No PNA onf CXR.  Will await urine culture but likely negative. ? Gastritis, vs PUD from advil vs pancreatitis from triglycerides, hepatitis vs constipation.  We will eval with labs. If negative consider further eval with imaging either KUB or CT. Clear liquids, rest.

## 2014-08-06 NOTE — Addendum Note (Signed)
Addended by: Eliezer Lofts E on: 08/06/2014 05:33 PM   Modules accepted: Orders

## 2014-08-06 NOTE — Telephone Encounter (Signed)
V/m left requesting cb when recent lab results available.

## 2014-08-06 NOTE — Progress Notes (Signed)
Subjective:    Patient ID: Brandi Mahoney, female    DOB: September 20, 1955, 59 y.o.   MRN: [id number]  HPI  59 year old female pt of Dr. Lillie Fragmin presents for urgent care follow up of  ?pneumonia.   She was seen 2/17 for  Right upper abdominal pain (5-6/10), increase Shortness of breath ( has to take more deeper breaths, nausea, dry heaves off and on x 2 weeks. No change in abdominal pain with eating She has dry cough x weeks. No congestion, no ear pain, firm not hard.no sinus pressure, no ST. No fever. She has constipation.  (Last BM yesterday, average size)Mild heartburn off and on.  UA was positive for wbc and small blood, urine culture. Urine culture pending. Given GI cocktail. No improvement CXR:  Initially thought to be  Positive right lower lung.. Radiologist later on read felt nml. Uses significant advil for headache.   HAs not been taking prevacid.  S/P cholecystectomy/hysterectomy/ appendectomy. Uses benefiber.  In past has had some IBS, gluten issues.  No ETOH use. She has high triglycerides.Marland Kitchenon tricor  Had nml cardiac work up with nml stress test... For SOB workup.  Due for TSH , lipids, CMET.  Review of Systems  Constitutional: Negative for fever and fatigue.  HENT: Negative for ear pain.   Eyes: Negative for pain.  Respiratory: Positive for cough and shortness of breath. Negative for chest tightness and wheezing.   Cardiovascular: Negative for chest pain, palpitations and leg swelling.  Gastrointestinal: Positive for nausea, abdominal pain and constipation. Negative for blood in stool.  Genitourinary: Negative for dysuria.       Objective:   Physical Exam  Constitutional: Vital signs are normal. She appears well-developed and well-nourished. She is cooperative.  Non-toxic appearance. She does not appear ill. No distress.  HENT:  Head: Normocephalic.  Right Ear: Hearing, tympanic membrane, external ear and ear canal normal. Tympanic membrane is not erythematous,  not retracted and not bulging.  Left Ear: Hearing, tympanic membrane, external ear and ear canal normal. Tympanic membrane is not erythematous, not retracted and not bulging.  Nose: No mucosal edema or rhinorrhea. Right sinus exhibits no maxillary sinus tenderness and no frontal sinus tenderness. Left sinus exhibits no maxillary sinus tenderness and no frontal sinus tenderness.  Mouth/Throat: Uvula is midline, oropharynx is clear and moist and mucous membranes are normal.  Eyes: Conjunctivae, EOM and lids are normal. Pupils are equal, round, and reactive to light. Lids are everted and swept, no foreign bodies found.  Neck: Trachea normal and normal range of motion. Neck supple. Carotid bruit is not present. No thyroid mass and no thyromegaly present.  Cardiovascular: Normal rate, regular rhythm, S1 normal, S2 normal, normal heart sounds, intact distal pulses and normal pulses.  Exam reveals no gallop and no friction rub.   No murmur heard. Pulmonary/Chest: Effort normal and breath sounds normal. No tachypnea. No respiratory distress. She has no decreased breath sounds. She has no wheezes. She has no rhonchi. She has no rales.  Abdominal: Soft. Normal appearance and bowel sounds are normal. There is no hepatosplenomegaly. There is tenderness in the right upper quadrant and epigastric area. There is guarding. There is no rebound and no CVA tenderness. No hernia.  Neurological: She is alert.  Skin: Skin is warm, dry and intact. No rash noted.  Psychiatric: Her speech is normal and behavior is normal. Judgment and thought content normal. Her mood appears not anxious. Cognition and memory are normal. She does not exhibit  a depressed mood.          Assessment & Plan:

## 2014-08-06 NOTE — Assessment & Plan Note (Signed)
Due for re-eval.. May be elevated and possible cause of pancreatitis if that is cause of pain.

## 2014-08-06 NOTE — Addendum Note (Signed)
Addended by: Marchia Bond on: 08/06/2014 01:38 PM   Modules accepted: Orders

## 2014-08-06 NOTE — Patient Instructions (Addendum)
We will wait in urine culture results from urgent care. Stop at lab on way out. Start back on prevacid. No advil.  Can use tyelnol for pain.  Stick wuth eating clear liquids until labs back, soup etc.  Go to ER for severe pain, intractable vomiting.

## 2014-08-06 NOTE — Progress Notes (Signed)
Pre visit review using our clinic review tool, if applicable. No additional management support is needed unless otherwise documented below in the visit note. 

## 2014-08-06 NOTE — Assessment & Plan Note (Signed)
Due for re-eval. 

## 2014-08-06 NOTE — Assessment & Plan Note (Signed)
No clear PNA on X-ray per radiology.  Chronic issues likely exacerbated by abdominal pain.

## 2014-08-07 ENCOUNTER — Ambulatory Visit (INDEPENDENT_AMBULATORY_CARE_PROVIDER_SITE_OTHER)
Admission: RE | Admit: 2014-08-07 | Discharge: 2014-08-07 | Disposition: A | Discharge status: Home / Self Care | Payer: PRIVATE HEALTH INSURANCE | Source: Ambulatory Visit | Attending: Family Medicine | Admitting: Family Medicine

## 2014-08-07 ENCOUNTER — Telehealth: Payer: Self-pay | Admitting: Family Medicine

## 2014-08-07 DIAGNOSIS — R1013 Epigastric pain: Secondary | ICD-10-CM

## 2014-08-07 DIAGNOSIS — R1011 Right upper quadrant pain: Secondary | ICD-10-CM

## 2014-08-07 IMAGING — CT CT ABD-PELV W/ CM
2 of 5 series · 17 of 46 positions shown, 19 images · IV contrast (Omnipaque 300)
Comparison: None.

CLINICAL DATA: Right upper quadrant pain

EXAM:
CT ABDOMEN AND PELVIS WITH CONTRAST
TECHNIQUE: Multidetector CT imaging of the abdomen and pelvis was performed
using the standard protocol following bolus administration of
intravenous contrast.
CONTRAST:  100mL OMNIPAQUE IOHEXOL 300 MG/ML  SOLN

[Series 2: abd/ pel 5mm · axial · 0.70mm/px · z∈[-492,-58]mm · 14 of 99 slices shown, 16 images]
[im 6/99  soft-tissue]
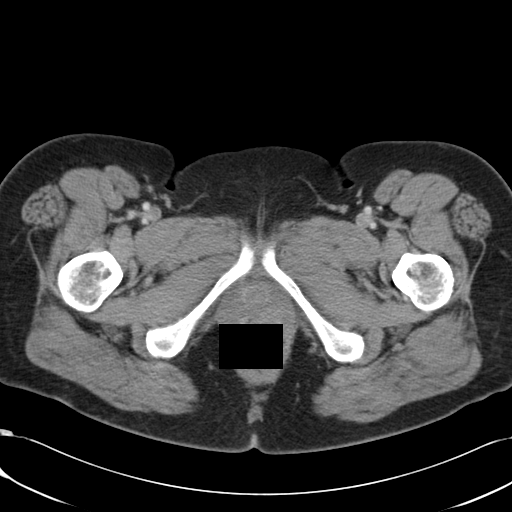
[im 6/99  bone]
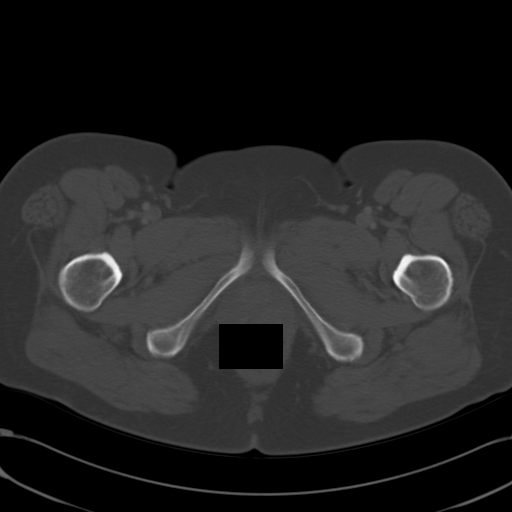
[im 11/99  soft-tissue]
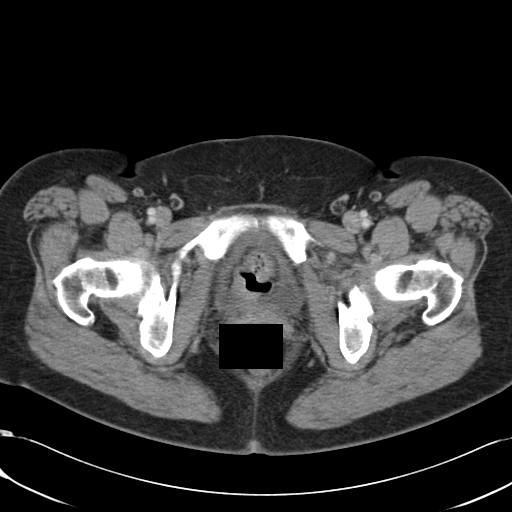
[im 21/99  soft-tissue]
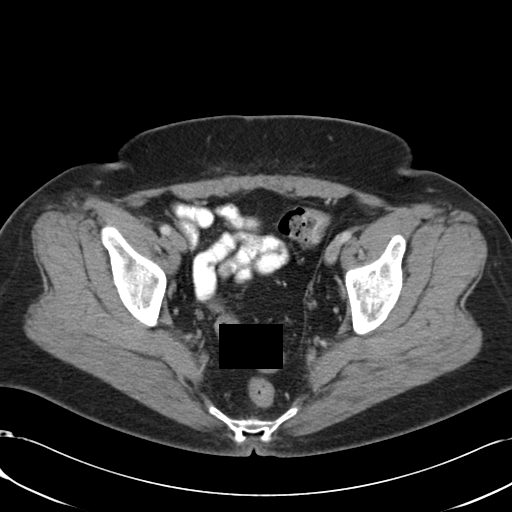
[im 26/99  soft-tissue]
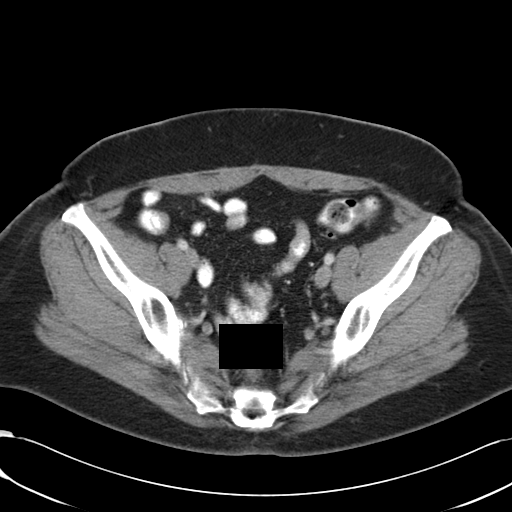
[im 31/99  soft-tissue]
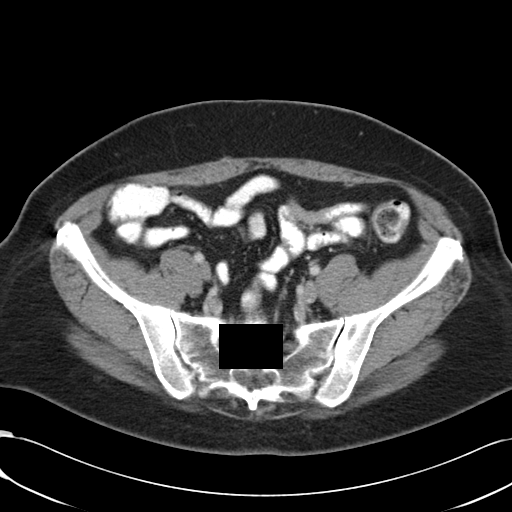
[im 42/99  soft-tissue]
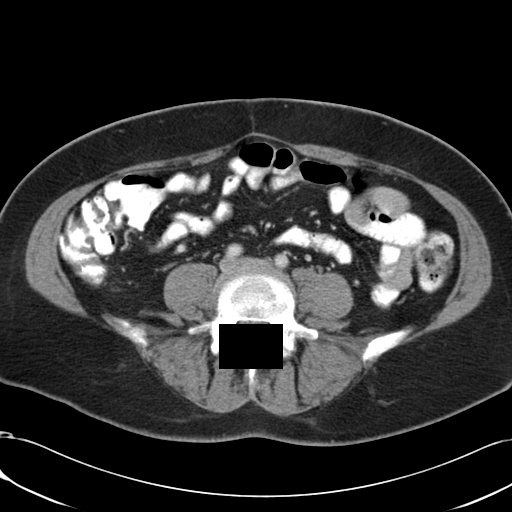
[im 47/99  soft-tissue]
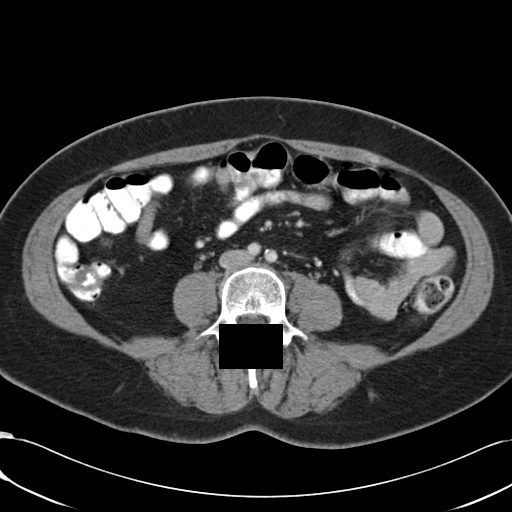
[im 52/99  soft-tissue]
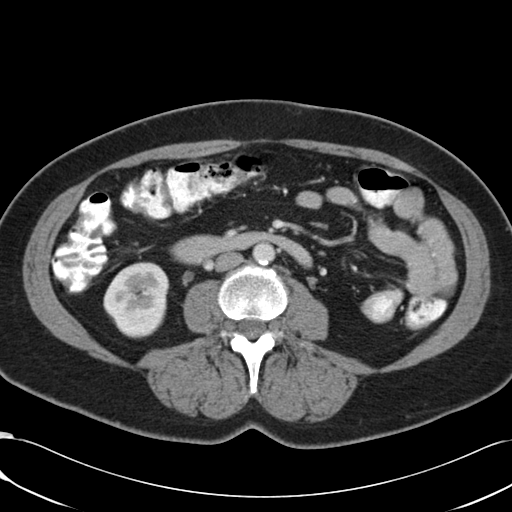
[im 57/99  soft-tissue]
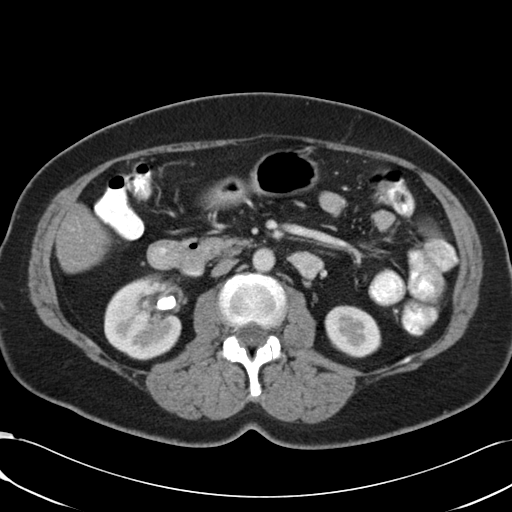
[im 57/99  bone]
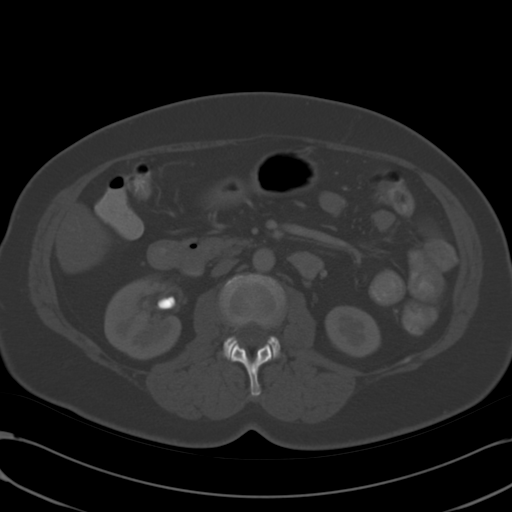
[im 68/99  soft-tissue]
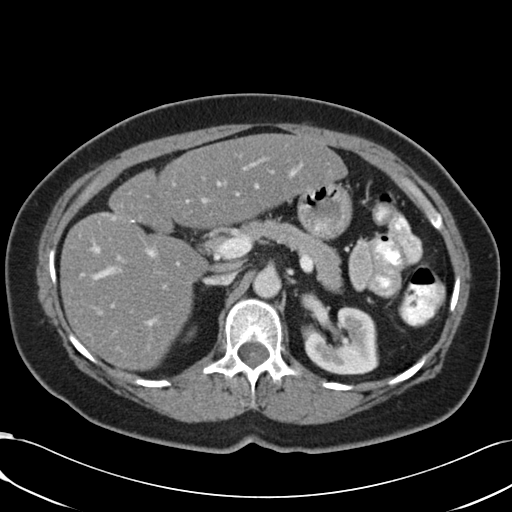
[im 73/99  soft-tissue]
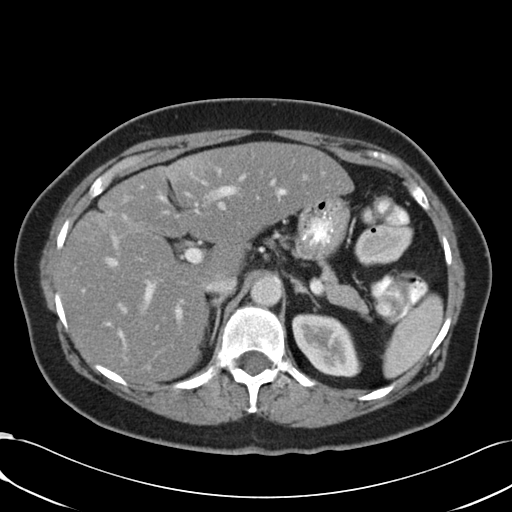
[im 78/99  soft-tissue]
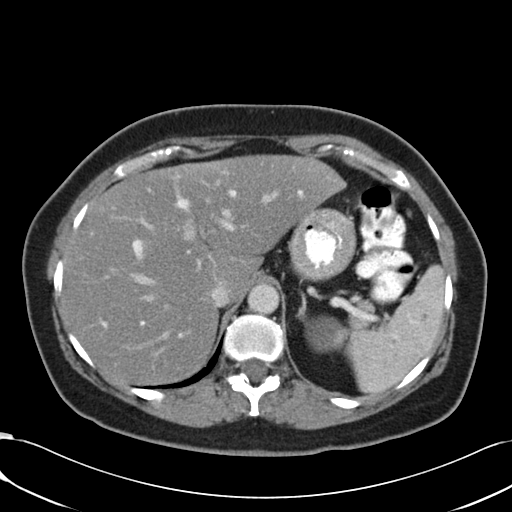
[im 88/99  soft-tissue]
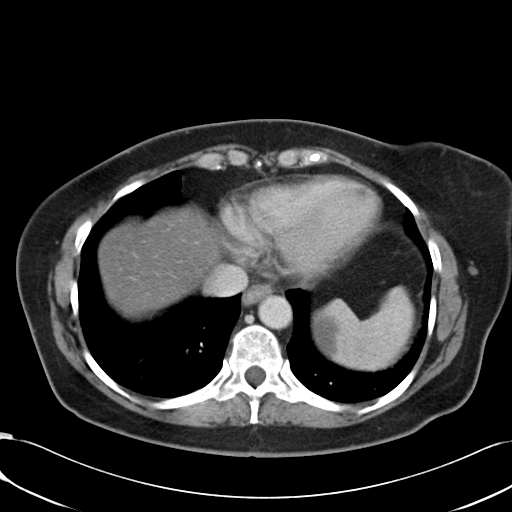
[im 93/99  soft-tissue]
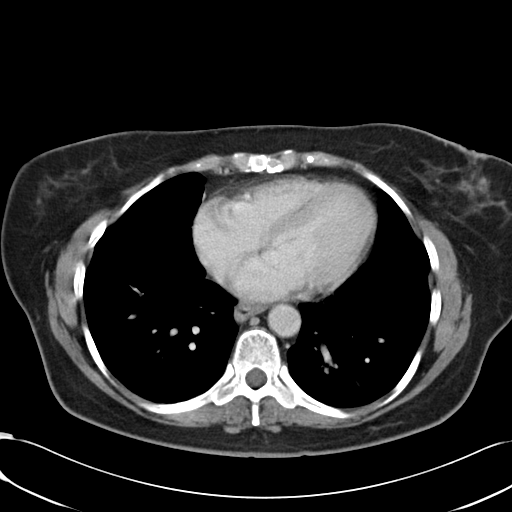

[Series 602: cor · coronal · 0.99mm/px · 3 of 116 slices shown]
[im 39/116  soft-tissue]
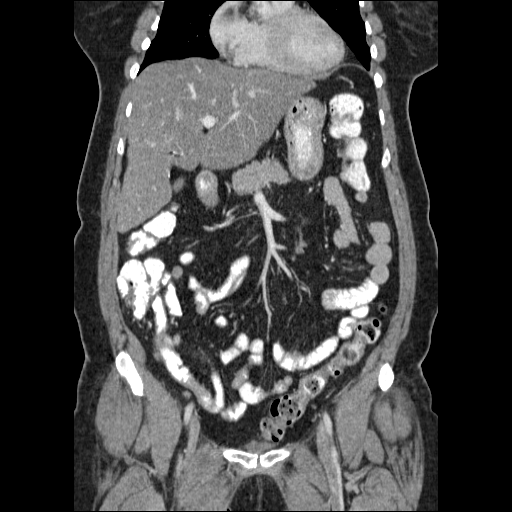
[im 52/116  soft-tissue]
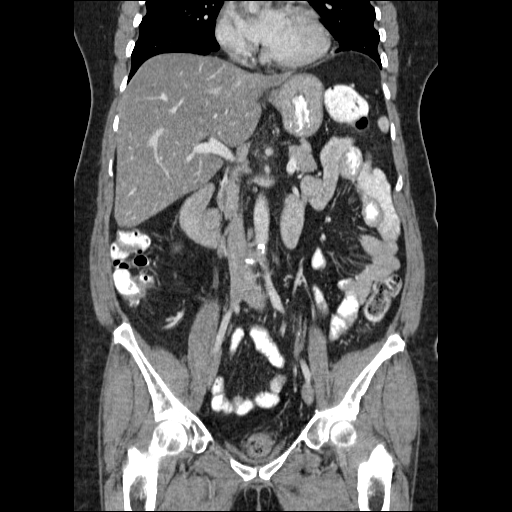
[im 64/116  soft-tissue]
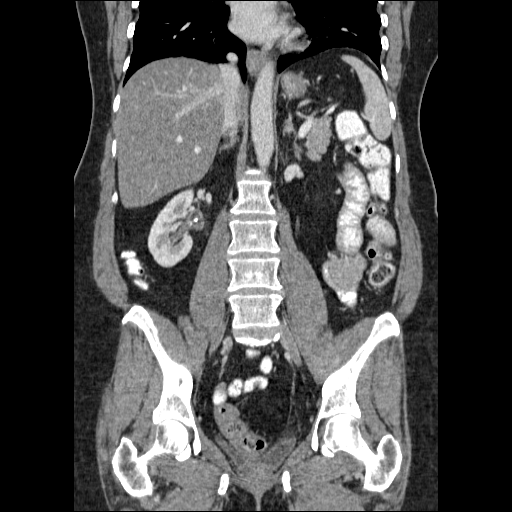

[17 of 46 positions shown; findings below may reference images not displayed]

FINDINGS: Lung bases are free of acute infiltrate or sizable effusion.

The liver is homogeneous in attenuation but diffusely decreased
consistent with fatty infiltration. The gallbladder has been
surgically removed. The spleen shows a rounded cystic structure
which measures 4.8 cm in greatest dimension. It has some peripheral
calcification is noted within likely representing a large splenic
cysts. This could represent a prior splenic hematoma given the
calcification. The adrenal glands and pancreas are within normal
limits. The kidneys are well visualized bilaterally. A large right
renal stone is noted within the renal pelvis. It measures
approximately 14 mm in greatest dimension. No obstructive changes
are seen.

The bladder is decompressed. The appendix has been surgically
removed. No pelvic mass lesion is seen. The osseous structures show
no acute abnormality.
IMPRESSION: 14 mm right renal pelvic stone without obstructive change.

Cystic lesion with peripheral calcifications within the spleen
likely representing a mildly complicated splenic cyst or old
hematoma.

Fatty liver.

No other focal abnormality is seen.

## 2014-08-07 MED ORDER — IOHEXOL 300 MG/ML  SOLN
100.0000 mL | Freq: Once | INTRAMUSCULAR | Status: AC | PRN
  Administered 2014-08-07: 100 mL via INTRAVENOUS

## 2014-08-07 MED ORDER — PANTOPRAZOLE SODIUM 40 MG PO TBEC: 40.0000 mg | DELAYED_RELEASE_TABLET | Freq: Every day | ORAL | Status: DC

## 2014-08-07 NOTE — Telephone Encounter (Signed)
Pt called to let you know That  [facility] called her about Urine culture results  Grew Strep a bacteria And they are calling her in something for this to [facility]

## 2014-08-07 NOTE — Telephone Encounter (Signed)
Brandi Mahoney notified as instructed by telephone.  She will not take antibiotics.  She is also inquiring whether or not she still needs to come in Monday to see Dr. Lorelei Pont.  Please Advise.

## 2014-08-07 NOTE — Telephone Encounter (Signed)
Referral sent to uro.  Rx for protonix sent in.

## 2014-08-07 NOTE — Telephone Encounter (Signed)
I recommend that she keep appt with Dr. Lorelei Pont on Monday given she is not feeling better, hopefully she will be better with protonix, but if not..may need a change in plan.

## 2014-08-07 NOTE — Telephone Encounter (Signed)
Brandi Mahoney notified to keep her appointment as scheduled with Dr. Lorelei Pont on Monday.

## 2014-08-07 NOTE — Telephone Encounter (Signed)
Noted. I don't feel this is true urinary infection.. I have culture results. Looks like  between 50,000 -100, 000of Group B strep not A .. Not pathogenic.  She can fill antibiotic as recommended by urgent care if she would like.

## 2014-08-09 ENCOUNTER — Emergency Department: Payer: Self-pay | Admitting: Emergency Medicine

## 2014-08-10 ENCOUNTER — Ambulatory Visit (INDEPENDENT_AMBULATORY_CARE_PROVIDER_SITE_OTHER): Payer: PRIVATE HEALTH INSURANCE | Admitting: Family Medicine

## 2014-08-10 ENCOUNTER — Encounter: Payer: Self-pay | Admitting: Family Medicine

## 2014-08-10 ENCOUNTER — Other Ambulatory Visit: Payer: Self-pay | Admitting: Family Medicine

## 2014-08-10 VITALS — BP 106/70 | HR 89 | Temp 98.3°F | Ht 64.0 in | Wt 171.8 lb

## 2014-08-10 DIAGNOSIS — N200 Calculus of kidney: Secondary | ICD-10-CM

## 2014-08-10 DIAGNOSIS — E785 Hyperlipidemia, unspecified: Secondary | ICD-10-CM

## 2014-08-10 DIAGNOSIS — E781 Pure hyperglyceridemia: Secondary | ICD-10-CM

## 2014-08-10 DIAGNOSIS — R1013 Epigastric pain: Secondary | ICD-10-CM

## 2014-08-10 DIAGNOSIS — E038 Other specified hypothyroidism: Secondary | ICD-10-CM

## 2014-08-10 MED ORDER — TOPIRAMATE 50 MG PO TABS: ORAL_TABLET | ORAL | Status: DC

## 2014-08-10 MED ORDER — FENOFIBRATE 145 MG PO TABS: 145.0000 mg | ORAL_TABLET | Freq: Every day | ORAL | Status: DC

## 2014-08-10 MED ORDER — LEVOTHYROXINE SODIUM 50 MCG PO TABS: 50.0000 ug | ORAL_TABLET | Freq: Every day | ORAL | Status: DC

## 2014-08-10 MED ORDER — PRAVASTATIN SODIUM 40 MG PO TABS: 40.0000 mg | ORAL_TABLET | Freq: Every day | ORAL | Status: DC

## 2014-08-10 MED ORDER — SERTRALINE HCL 50 MG PO TABS: ORAL_TABLET | ORAL | Status: DC

## 2014-08-10 NOTE — Progress Notes (Signed)
Dr. Frederico Hamman T. Allora Bains, MD, [facility] [place], [address] Phone: [phone] Fax: [phone]  08/10/2014  Patient: Brandi Mahoney, MRN: [id number], DOB: 24-Jan-1956, 59 y.o.  Primary Physician:  Owens Loffler, MD  Chief Complaint: Follow-up  Subjective:   Brandi Mahoney is a 59 y.o. very pleasant female patient who presents with the following:  Large stone.   Seb K. LEFT forehead.  There was a woman who underwent a who is here for multiple reasons including recent ongoing abdominal pain.  She actually was in the emergency room at [facility] this morning.  She required some pain medicine, and they gave her some Percocet in some Zofran to take home with her.  A 14 mm obstructing stomach was found on a CT of her admitted for pelvis at the end of the week.  At that point there appeared to be no hydronephrosis.  All other labs grossly been normal.    Lipids: Doing well, stable. Tolerating meds fine with no SE. Panel reviewed with patient.  Lipids:    Component Value Date/Time   CHOL 173 08/06/2014 1338   TRIG 279.0* 08/06/2014 1338   HDL 42.70 08/06/2014 1338   LDLDIRECT 90.0 08/06/2014 1338   VLDL 55.8* 08/06/2014 1338   CHOLHDL 4 08/06/2014 1338    Lab Results  Component Value Date   ALT 40* 08/06/2014   AST 35 08/06/2014   ALKPHOS 72 08/06/2014   BILITOT 0.5 08/06/2014    HTN: Tolerating all medications without side effects Stable and at goal No CP, no sob. No HA.  BP Readings from Last 3 Encounters:  08/10/14 106/70  08/06/14 110/82  09/25/13 517/00    Basic Metabolic Panel:    Component Value Date/Time   NA 139 08/06/2014 1338   K 3.9 08/06/2014 1338   CL 106 08/06/2014 1338   CO2 28 08/06/2014 1338   BUN 12 08/06/2014 1338   CREATININE 0.83 08/06/2014 1338   GLUCOSE 101* 08/06/2014 1338   CALCIUM 9.5 08/06/2014 1338      Past Medical History, Surgical History, Social History,  Family History, Problem List, Medications, and Allergies have been reviewed and updated if relevant.   GEN: as above GI: No n/v/d, eating normally Pulm: No SOB Interactive and getting along well at home.  Otherwise, ROS is as per the HPI.  Objective:   BP 106/70 mmHg  Pulse 89  Temp(Src) 98.3 F (36.8 C) (Oral)  Ht 5\' 4"  (1.626 m)  Wt 171 lb 12 oz (77.905 kg)  BMI 29.47 kg/m2  SpO2 96%  GEN: WDWN, NAD, Non-toxic, A & O x 3 HEENT: Atraumatic, Normocephalic. Neck supple. No masses, No LAD. Ears and Nose: No external deformity. CV: RRR, No M/G/R. No JVD. No thrill. No extra heart sounds. PULM: CTA B, no wheezes, crackles, rhonchi. No retractions. No resp. distress. No accessory muscle use. ABD: S, NT, ND, + BS, No rebound, No HSM  EXTR: No c/c/e NEURO Normal gait.  PSYCH: Normally interactive. Conversant. Not depressed or anxious appearing.  Calm demeanor.   Laboratory and Imaging Data: Ct Abdomen Pelvis W Contrast  08/07/2014   CLINICAL DATA:  Right upper quadrant pain  EXAM: CT ABDOMEN AND PELVIS WITH CONTRAST  TECHNIQUE: Multidetector CT imaging of the abdomen and pelvis was performed using the standard protocol following bolus administration of intravenous contrast.  CONTRAST:  135mL OMNIPAQUE IOHEXOL 300 MG/ML  SOLN  COMPARISON:  None.  FINDINGS: Lung  bases are free of acute infiltrate or sizable effusion.  The liver is homogeneous in attenuation but diffusely decreased consistent with fatty infiltration. The gallbladder has been surgically removed. The spleen shows a rounded cystic structure which measures 4.8 cm in greatest dimension. It has some peripheral calcification is noted within likely representing a large splenic cysts. This could represent a prior splenic hematoma given the calcification. The adrenal glands and pancreas are within normal limits. The kidneys are well visualized bilaterally. A large right renal stone is noted within the renal pelvis. It measures  approximately 14 mm in greatest dimension. No obstructive changes are seen.  The bladder is decompressed. The appendix has been surgically removed. No pelvic mass lesion is seen. The osseous structures show no acute abnormality.  IMPRESSION: 14 mm right renal pelvic stone without obstructive change.  Cystic lesion with peripheral calcifications within the spleen likely representing a mildly complicated splenic cyst or old hematoma.  Fatty liver.  No other focal abnormality is seen.   Electronically Signed   By: Inez Catalina M.D.   On: 08/07/2014 11:56    Results for orders placed or performed in visit on 08/06/14  Hepatitis panel, acute  Result Value Ref Range   Hepatitis B Surface Ag NEGATIVE NEGATIVE   HCV Ab NEGATIVE NEGATIVE   Hep B C IgM NON REACTIVE NON REACTIVE   Hep A IgM NON REACTIVE NON REACTIVE  CBC with Differential/Platelet  Result Value Ref Range   WBC 8.4 4.0 - 10.5 K/uL   RBC 5.10 3.87 - 5.11 Mil/uL   Hemoglobin 13.7 12.0 - 15.0 g/dL   HCT 41.3 36.0 - 46.0 %   MCV 80.9 78.0 - 100.0 fl   MCHC 33.2 30.0 - 36.0 g/dL   RDW 13.5 11.5 - 15.5 %   Platelets 279.0 150.0 - 400.0 K/uL   Neutrophils Relative % 61.3 43.0 - 77.0 %   Lymphocytes Relative 29.5 12.0 - 46.0 %   Monocytes Relative 6.2 3.0 - 12.0 %   Eosinophils Relative 2.5 0.0 - 5.0 %   Basophils Relative 0.5 0.0 - 3.0 %   Neutro Abs 5.1 1.4 - 7.7 K/uL   Lymphs Abs 2.5 0.7 - 4.0 K/uL   Monocytes Absolute 0.5 0.1 - 1.0 K/uL   Eosinophils Absolute 0.2 0.0 - 0.7 K/uL   Basophils Absolute 0.0 0.0 - 0.1 K/uL  Comprehensive metabolic panel  Result Value Ref Range   Sodium 139 135 - 145 mEq/L   Potassium 3.9 3.5 - 5.1 mEq/L   Chloride 106 96 - 112 mEq/L   CO2 28 19 - 32 mEq/L   Glucose, Bld 101 (H) 70 - 99 mg/dL   BUN 12 6 - 23 mg/dL   Creatinine, Ser 0.83 0.40 - 1.20 mg/dL   Total Bilirubin 0.5 0.2 - 1.2 mg/dL   Alkaline Phosphatase 72 39 - 117 U/L   AST 35 0 - 37 U/L   ALT 40 (H) 0 - 35 U/L   Total Protein 7.4 6.0  - 8.3 g/dL   Albumin 4.5 3.5 - 5.2 g/dL   Calcium 9.5 8.4 - 10.5 mg/dL   GFR 74.99 >60.00 mL/min  Lipid panel  Result Value Ref Range   Cholesterol 173 0 - 200 mg/dL   Triglycerides 279.0 (H) 0.0 - 149.0 mg/dL   HDL 42.70 >39.00 mg/dL   VLDL 55.8 (H) 0.0 - 40.0 mg/dL   Total CHOL/HDL Ratio 4    NonHDL 130.30   Lipase  Result Value Ref Range  Lipase 27.0 11.0 - 59.0 U/L  TSH  Result Value Ref Range   TSH 2.37 0.35 - 4.50 uIU/mL  LDL cholesterol, direct  Result Value Ref Range   Direct LDL 90.0 mg/dL     Assessment and Plan:   Abdominal pain, epigastric  Kidney stone  Other specified hypothyroidism  Hyperlipemia  HYPERTRIGLYCERIDEMIA  Refilled all meds.  Keep urology appointment. I reviewed the scans with her myself in the office.   She has #20, Percocet 5's, which should last her until her urology consultation.  Signed,  Maud Deed. Cattleya Dobratz, MD   Patient's Medications  New Prescriptions   No medications on file  Previous Medications   ASPIRIN 81 MG TABLET    Take 81 mg by mouth daily.     GLUCOSE BLOOD (ACCU-CHEK AVIVA) TEST STRIP    Use as instructed    NITROGLYCERIN (NITROSTAT) 0.4 MG SL TABLET    1 tablet under tongue at onset of chest pain; you may repeat every 5 minutes for up to 3 doses.   PANTOPRAZOLE (PROTONIX) 40 MG TABLET    Take 1 tablet (40 mg total) by mouth daily.  Modified Medications   Modified Medication Previous Medication   FENOFIBRATE (TRICOR) 145 MG TABLET fenofibrate (TRICOR) 145 MG tablet      Take 1 tablet (145 mg total) by mouth daily.    TAKE 1 TABLET BY MOUTH DAILY   LEVOTHYROXINE (SYNTHROID, LEVOTHROID) 50 MCG TABLET levothyroxine (SYNTHROID, LEVOTHROID) 50 MCG tablet      Take 1 tablet (50 mcg total) by mouth daily.    Take 1 tablet (50 mcg total) by mouth daily.   PRAVASTATIN (PRAVACHOL) 40 MG TABLET pravastatin (PRAVACHOL) 40 MG tablet      Take 1 tablet (40 mg total) by mouth at bedtime.    Take 1 tablet (40 mg total) by  mouth at bedtime. Needs to schedule a follow up appointment with Dr, having fasting labs prior to it.   SERTRALINE (ZOLOFT) 50 MG TABLET sertraline (ZOLOFT) 50 MG tablet      Take one tablet daily.    Take one tablet daily. **MUST HAVE PHYSICAL FOR FURTHER REFILLS**   TOPIRAMATE (TOPAMAX) 50 MG TABLET topiramate (TOPAMAX) 50 MG tablet      Take one tablet twice daily    Take one tablet twice daily **MUST HAVE PHYSICAL FOR FURTHER REFILLS**  Discontinued Medications   No medications on file

## 2014-08-10 NOTE — Progress Notes (Signed)
Pre visit review using our clinic review tool, if applicable. No additional management support is needed unless otherwise documented below in the visit note. 

## 2014-08-14 ENCOUNTER — Other Ambulatory Visit: Payer: PRIVATE HEALTH INSURANCE

## 2014-08-16 ENCOUNTER — Emergency Department (HOSPITAL_COMMUNITY): Payer: PRIVATE HEALTH INSURANCE

## 2014-08-16 ENCOUNTER — Emergency Department (HOSPITAL_COMMUNITY)
Admission: EM | Admit: 2014-08-16 | Discharge: 2014-08-16 | Disposition: A | Discharge status: Home / Self Care | Payer: PRIVATE HEALTH INSURANCE | Attending: Emergency Medicine | Admitting: Emergency Medicine

## 2014-08-16 ENCOUNTER — Encounter (HOSPITAL_COMMUNITY): Payer: Self-pay | Admitting: Emergency Medicine

## 2014-08-16 DIAGNOSIS — E119 Type 2 diabetes mellitus without complications: Secondary | ICD-10-CM | POA: Insufficient documentation

## 2014-08-16 DIAGNOSIS — J45909 Unspecified asthma, uncomplicated: Secondary | ICD-10-CM | POA: Insufficient documentation

## 2014-08-16 DIAGNOSIS — R109 Unspecified abdominal pain: Secondary | ICD-10-CM | POA: Diagnosis present

## 2014-08-16 DIAGNOSIS — Z79899 Other long term (current) drug therapy: Secondary | ICD-10-CM | POA: Insufficient documentation

## 2014-08-16 DIAGNOSIS — E039 Hypothyroidism, unspecified: Secondary | ICD-10-CM | POA: Diagnosis not present

## 2014-08-16 DIAGNOSIS — Z8719 Personal history of other diseases of the digestive system: Secondary | ICD-10-CM | POA: Insufficient documentation

## 2014-08-16 DIAGNOSIS — I252 Old myocardial infarction: Secondary | ICD-10-CM | POA: Diagnosis not present

## 2014-08-16 DIAGNOSIS — Z880 Allergy status to penicillin: Secondary | ICD-10-CM | POA: Diagnosis not present

## 2014-08-16 DIAGNOSIS — R10A0 Flank pain, unspecified side: Secondary | ICD-10-CM

## 2014-08-16 DIAGNOSIS — N23 Unspecified renal colic: Secondary | ICD-10-CM

## 2014-08-16 DIAGNOSIS — Z8701 Personal history of pneumonia (recurrent): Secondary | ICD-10-CM | POA: Diagnosis not present

## 2014-08-16 DIAGNOSIS — E785 Hyperlipidemia, unspecified: Secondary | ICD-10-CM | POA: Diagnosis not present

## 2014-08-16 DIAGNOSIS — Z7982 Long term (current) use of aspirin: Secondary | ICD-10-CM | POA: Insufficient documentation

## 2014-08-16 DIAGNOSIS — F329 Major depressive disorder, single episode, unspecified: Secondary | ICD-10-CM | POA: Diagnosis not present

## 2014-08-16 DIAGNOSIS — Z8679 Personal history of other diseases of the circulatory system: Secondary | ICD-10-CM | POA: Insufficient documentation

## 2014-08-16 LAB — COMPREHENSIVE METABOLIC PANEL
ALBUMIN: 4.2 g/dL (ref 3.5–5.2)
ALK PHOS: 64 U/L (ref 39–117)
ALT: 40 U/L — AB (ref 0–35)
ANION GAP: 3 — AB (ref 5–15)
AST: 37 U/L (ref 0–37)
BUN: 9 mg/dL (ref 6–23)
CALCIUM: 8.9 mg/dL (ref 8.4–10.5)
CO2: 25 mmol/L (ref 19–32)
Chloride: 109 mmol/L (ref 96–112)
Creatinine, Ser: 0.82 mg/dL (ref 0.50–1.10)
GFR calc Af Amer: 90 mL/min — ABNORMAL LOW (ref >90)
GFR calc non Af Amer: 77 mL/min — ABNORMAL LOW (ref >90)
Glucose, Bld: 120 mg/dL — ABNORMAL HIGH (ref 70–99)
Potassium: 3.7 mmol/L (ref 3.5–5.1)
Sodium: 137 mmol/L (ref 135–145)
TOTAL PROTEIN: 6.7 g/dL (ref 6.0–8.3)
Total Bilirubin: <0.1 mg/dL — ABNORMAL LOW (ref 0.3–1.2)

## 2014-08-16 LAB — URINALYSIS, ROUTINE W REFLEX MICROSCOPIC
BILIRUBIN URINE: NEGATIVE
Glucose, UA: NEGATIVE mg/dL
KETONES UR: NEGATIVE mg/dL
Leukocytes, UA: NEGATIVE
NITRITE: NEGATIVE
PROTEIN: NEGATIVE mg/dL
SPECIFIC GRAVITY, URINE: 1.017 (ref 1.005–1.030)
UROBILINOGEN UA: 0.2 mg/dL (ref 0.0–1.0)
pH: 5 (ref 5.0–8.0)

## 2014-08-16 LAB — CBC WITH DIFFERENTIAL/PLATELET
Basophils Absolute: 0 10*3/uL (ref 0.0–0.1)
Basophils Relative: 0 % (ref 0–1)
EOS PCT: 3 % (ref 0–5)
Eosinophils Absolute: 0.2 10*3/uL (ref 0.0–0.7)
HEMATOCRIT: 39.5 % (ref 36.0–46.0)
HEMOGLOBIN: 13 g/dL (ref 12.0–15.0)
LYMPHS ABS: 2.3 10*3/uL (ref 0.7–4.0)
Lymphocytes Relative: 30 % (ref 12–46)
MCH: 27 pg (ref 26.0–34.0)
MCHC: 32.9 g/dL (ref 30.0–36.0)
MCV: 82 fL (ref 78.0–100.0)
MONOS PCT: 7 % (ref 3–12)
Monocytes Absolute: 0.5 10*3/uL (ref 0.1–1.0)
Neutro Abs: 4.6 10*3/uL (ref 1.7–7.7)
Neutrophils Relative %: 60 % (ref 43–77)
PLATELETS: 290 10*3/uL (ref 150–400)
RBC: 4.82 MIL/uL (ref 3.87–5.11)
RDW: 13.8 % (ref 11.5–15.5)
WBC: 7.6 10*3/uL (ref 4.0–10.5)

## 2014-08-16 LAB — URINE MICROSCOPIC-ADD ON

## 2014-08-16 IMAGING — US US ABDOMEN COMPLETE
1 series · 13 of 25 positions shown · non-contrast
Comparison: [DATE]

CLINICAL DATA: Abdominal and flank pain

EXAM:
ULTRASOUND ABDOMEN COMPLETE

[Series 1: us abdomen complete · 0.24mm/px · 13 of 91 slices shown]
[im 1/91]
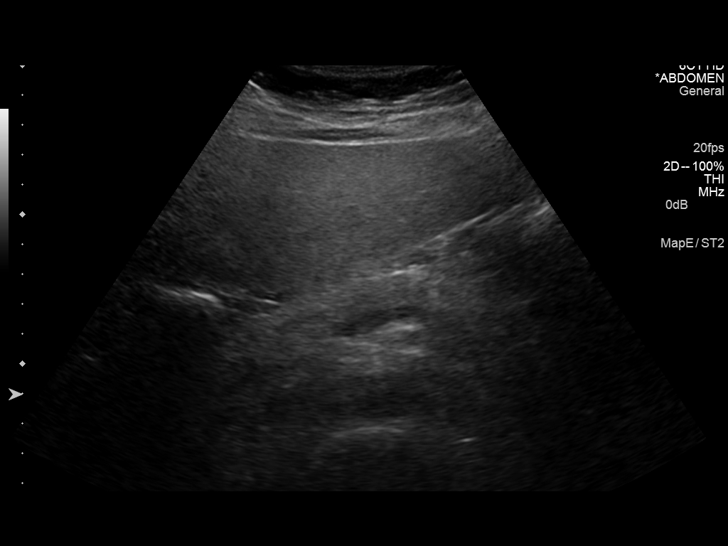
[im 8/91]
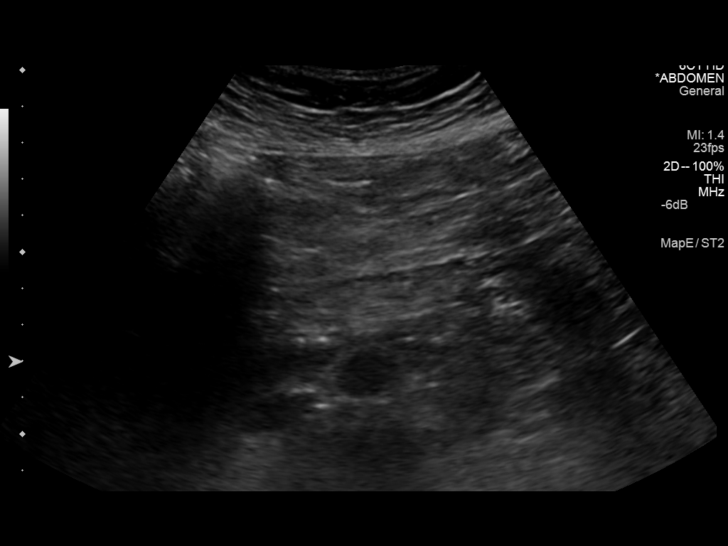
[im 16/91]
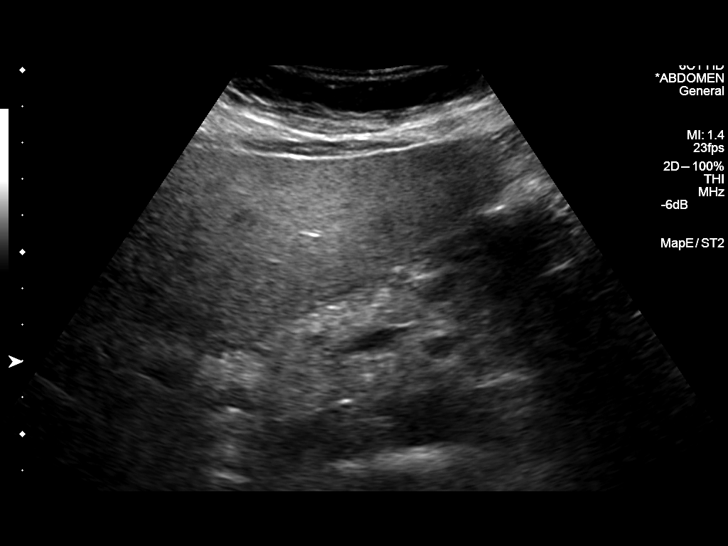
[im 23/91]
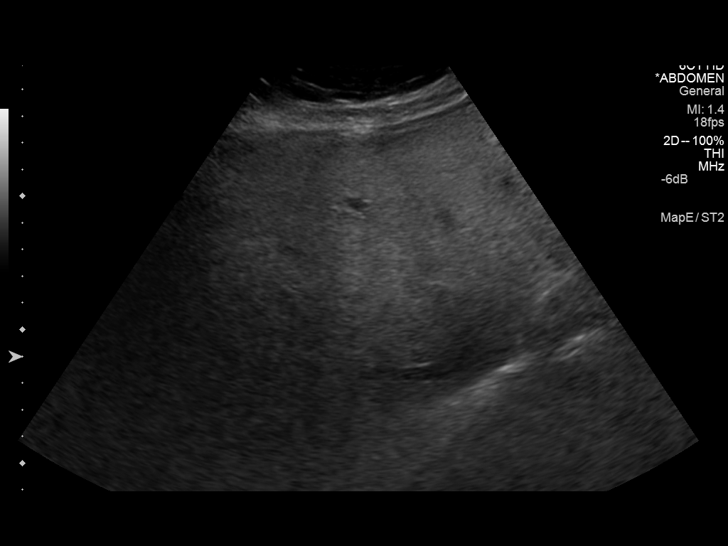
[im 31/91]
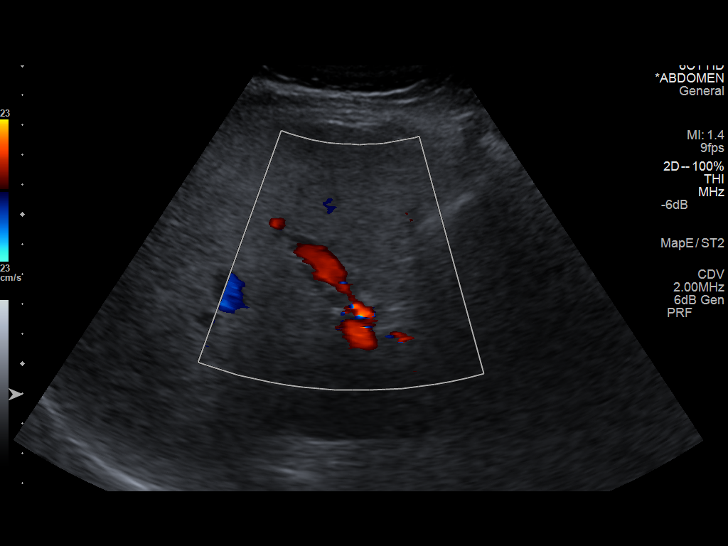
[im 38/91]
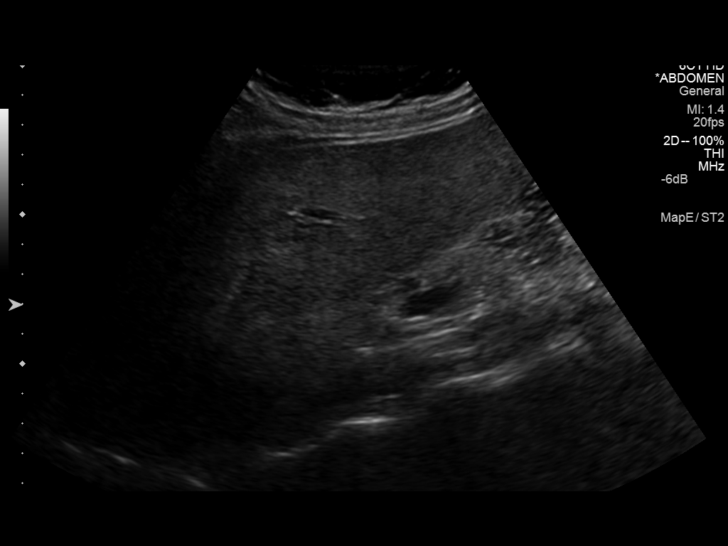
[im 46/91]
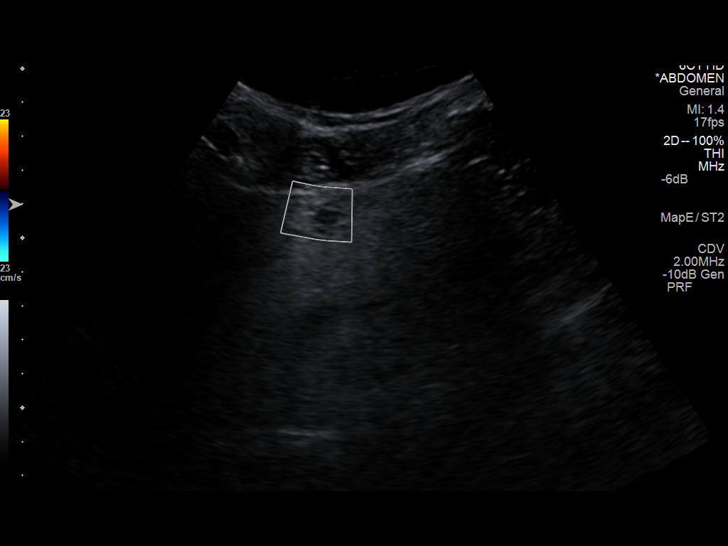
[im 53/91]
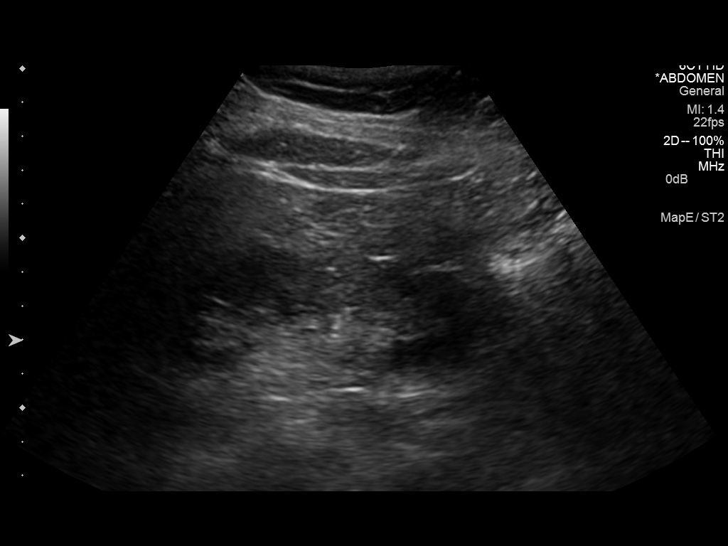
[im 61/91]
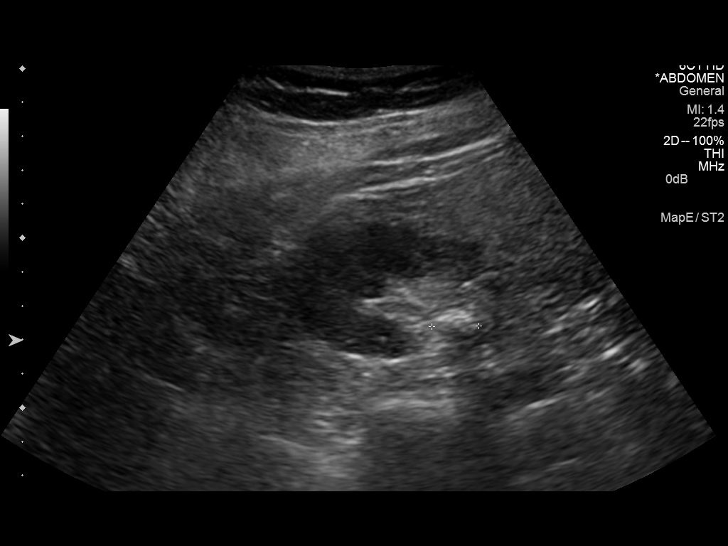
[im 68/91]
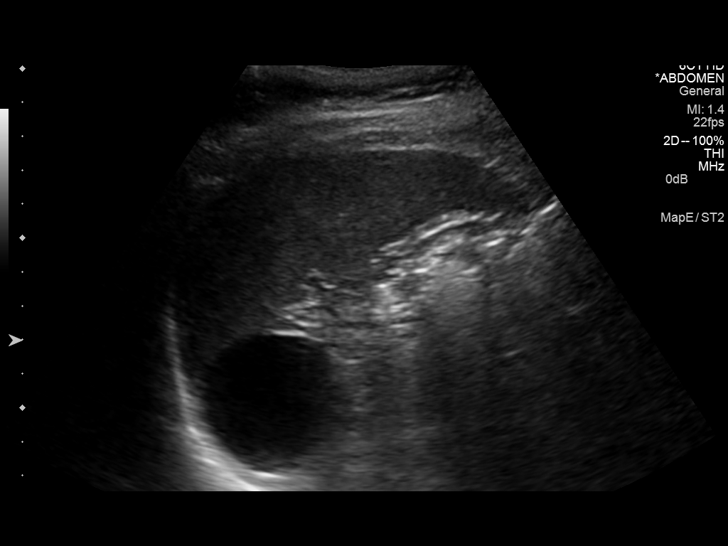
[im 76/91]
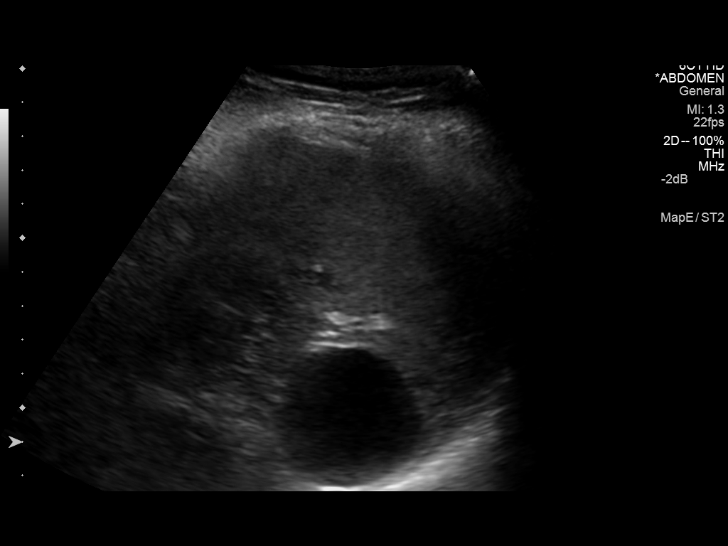
[im 83/91]
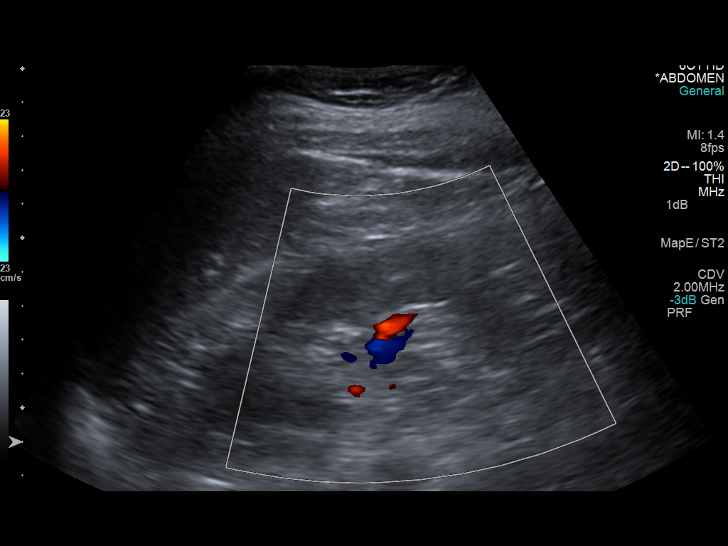
[im 91/91]
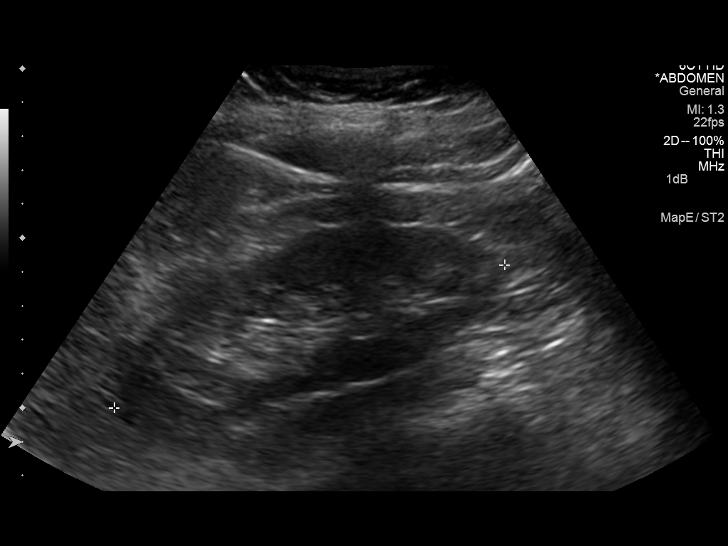

[13 of 25 positions shown; findings below may reference images not displayed]

FINDINGS: Gallbladder: Surgically removed

Common bile duct: Diameter: 8 mm

Liver: Fatty infiltrated without focal mass. A small 8 mm hypoechoic
area is noted likely representing a small cyst.

IVC: No abnormality visualized.

Pancreas: Visualized portion unremarkable.

Spleen: Cystic lesion is again identified with some peripheral
calcifications similar to that noted on prior CT examination.

Right Kidney: Length: 11.3 cm.. A 13 mm echogenic focus is
identified in the right renal pelvis consistent with a renal pelvic
stone. This is stable from the recent CT examination. This may cause
some flank pain related to motion within the renal pelvis.

Left Kidney: Length: 12.3 cm.. Echogenicity within normal limits. No
mass or hydronephrosis visualized.

Abdominal aorta: No aneurysm visualized.

Other findings: None.
IMPRESSION: Right renal pelvic stone. This may cause some symptomatology with a
ball valve affect within the collecting system although no
obstructive changes are noted currently.

Hypoechoic structure within the spleen with peripheral
calcifications similar to that noted on the prior CT examination.

Fatty liver.

Status post cholecystectomy.

## 2014-08-16 MED ORDER — OXYCODONE-ACETAMINOPHEN 5-325 MG PO TABS
1.0000 | ORAL_TABLET | Freq: Once | ORAL | Status: DC
Start: 2014-08-16 — End: 2014-08-16
  Filled 2014-08-16: qty 1

## 2014-08-16 MED ORDER — OXYCODONE-ACETAMINOPHEN 5-325 MG PO TABS: 1.0000 | ORAL_TABLET | ORAL | Status: DC | PRN

## 2014-08-16 MED ORDER — OXYCODONE-ACETAMINOPHEN 5-325 MG PO TABS
2.0000 | ORAL_TABLET | Freq: Once | ORAL | Status: AC
  Administered 2014-08-16: 2 via ORAL
  Filled 2014-08-16: qty 2

## 2014-08-16 MED ORDER — ONDANSETRON HCL 4 MG/2ML IJ SOLN
4.0000 mg | Freq: Once | INTRAMUSCULAR | Status: AC
  Administered 2014-08-16: 4 mg via INTRAVENOUS
  Filled 2014-08-16: qty 2

## 2014-08-16 MED ORDER — MORPHINE SULFATE 4 MG/ML IJ SOLN
4.0000 mg | Freq: Once | INTRAMUSCULAR | Status: AC
  Administered 2014-08-16: 4 mg via INTRAVENOUS
  Filled 2014-08-16: qty 1

## 2014-08-16 NOTE — ED Notes (Addendum)
Pt reports pain to right lower quadrant abdominal pain that radiates around to right low back x couple weeks. Pt diagnosed with kidney stones. Pt c/o diarrhea. Pt took a percocet PTA

## 2014-08-16 NOTE — ED Provider Notes (Signed)
CSN: [id number]     Arrival date & time 08/16/14  1446 History   First MD Initiated Contact with Patient 08/16/14 1758     Chief Complaint  Patient presents with  . Abdominal Pain  . Flank Pain  . Back Pain     (Consider location/radiation/quality/duration/timing/severity/associated sxs/prior Treatment) Patient is a 59 y.o. female presenting with abdominal pain.  Abdominal Pain Pain location:  R flank Pain quality: aching   Pain radiates to:  Does not radiate Pain severity:  Severe Onset quality:  Gradual Duration:  10 days Timing:  Constant Progression:  Worsening Chronicity:  New Context comment:  Saw her PCP, CT done and showed 14 mm right renal pelvic stone.  Has seen urology who plans to remove stone in a few weeks.  meanwhile, pain has worsened.  Relieved by: percocet. Worsened by:  Nothing tried Associated symptoms: diarrhea (today, took a pro-motility tea.) and nausea   Associated symptoms: no dysuria, no fever, no hematuria, no shortness of breath and no vomiting     Past Medical History  Diagnosis Date  . Asthma   . Depression   . Diabetes mellitus     type II  . Migraine headache   . IBS (irritable bowel syndrome)   . Hypothyroidism   . Myocarditis 01/21/2010    hospitalized- [facility]  . Hemorrhoids   . HLD (hyperlipidemia)   . History of pneumonia 1969  . 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 Appendectomy  1974  . Cholecystectomy  2002  . Total abdominal hysterectomy w/ bilateral salpingoophorectomy  2000    no ovaries, uterus, or cervix.  . Cardiac catheterization  8/11    no significant -[facility]- Dr Rockey Situ,  . Breast biopsy  1988   Family History  Problem Relation Age of Onset  . Breast cancer Paternal Grandmother   . Lung cancer Paternal Grandfather     CAD  . Coronary artery disease Mother   . Diabetes Mother   . ALS Mother     . Other Daughter     gluten  . Hyperlipidemia Father   . Down syndrome Son   . Colon cancer Maternal Grandfather    History  Substance Use Topics  . Smoking status: Former Smoker -- 0.35 packs/day for 35 years    Types: Cigarettes    Quit date: 09/25/2008  . Smokeless tobacco: Never Used     Comment: quit in 2007-2008, social smoker  . Alcohol Use: Yes     Comment: rarely   OB History    No data available     Review of Systems  Constitutional: Negative for fever.  Respiratory: Negative for shortness of breath.   Gastrointestinal: Positive for nausea, abdominal pain and diarrhea (today, took a pro-motility tea.). Negative for vomiting.  Genitourinary: Positive for flank pain. Negative for dysuria and hematuria.  All other systems reviewed and are negative.     Allergies  Pseudoeph-doxylamine-dm-apap; Gluten; and Penicillins  Home Medications   Prior to Admission medications   Medication Sig Start Date End Date Taking? Authorizing Provider  cephALEXin (KEFLEX) 500 MG capsule Take 500 mg by mouth 2 (two) times daily. 10 day course started 08/12/14   Yes Historical Provider, MD  fenofibrate (TRICOR) 145 MG tablet Take 1 tablet (145 mg total) by mouth daily. Patient taking differently: Take 145 mg by mouth every other day. Alternate with pravastatin  08/10/14  Yes Spencer Copland, MD  levothyroxine (SYNTHROID, LEVOTHROID) 50 MCG tablet Take 1 tablet (50 mcg total) by mouth daily. 08/10/14  Yes Owens Loffler, MD  nitroGLYCERIN (NITROSTAT) 0.4 MG SL tablet 1 tablet under tongue at onset of chest pain; you may repeat every 5 minutes for up to 3 doses. Patient taking differently: Place 0.4 mg under the tongue every 5 (five) minutes as needed for chest pain. you may repeat every 5 minutes for up to 3 doses. 07/21/11  Yes Tonia Ghent, MD  oxyCODONE-acetaminophen (PERCOCET/ROXICET) 5-325 MG per tablet Take 1 tablet by mouth every 4 (four) hours as needed for severe pain (pain).   Yes  Historical Provider, MD  pantoprazole (PROTONIX) 40 MG tablet Take 1 tablet (40 mg total) by mouth daily. 08/07/14  Yes Amy Cletis [place], MD  pravastatin (PRAVACHOL) 40 MG tablet Take 1 tablet (40 mg total) by mouth at bedtime. Patient taking differently: Take 40 mg by mouth every other day. Alternate with fenofibrate 08/10/14  Yes Spencer Copland, MD  sertraline (ZOLOFT) 50 MG tablet Take one tablet daily. Patient taking differently: Take 50 mg by mouth daily.  08/10/14  Yes Owens Loffler, MD  topiramate (TOPAMAX) 50 MG tablet Take one tablet twice daily Patient taking differently: Take 50 mg by mouth daily.  08/10/14  Yes Owens Loffler, MD  aspirin EC 81 MG tablet Take 81 mg by mouth daily.    Historical Provider, MD  glucose blood (ACCU-CHEK AVIVA) test strip Use as instructed     Historical Provider, MD   BP 121/60 mmHg  Pulse 64  Temp(Src) 98.8 F (37.1 C)  Resp 16  Ht 5\' 4"  (1.626 m)  Wt 168 lb (76.204 kg)  BMI 28.82 kg/m2  SpO2 100% Physical Exam  Constitutional: She is oriented to person, place, and time. She appears well-developed and well-nourished. No distress.  HENT:  Head: Normocephalic and atraumatic.  Eyes: Conjunctivae are normal. No scleral icterus.  Neck: Neck supple.  Cardiovascular: Normal rate and intact distal pulses.   Pulmonary/Chest: Effort normal. No stridor. No respiratory distress.  Abdominal: Normal appearance. She exhibits no distension.  Right flank tenderness to palpation, no r/r/g.  Musculoskeletal:       Back:  Neurological: She is alert and oriented to person, place, and time.  Skin: Skin is warm and dry. No rash noted.  Psychiatric: She has a normal mood and affect. Her behavior is normal.  Nursing note and vitals reviewed.   ED Course  Procedures (including critical care time) Labs Review Labs Reviewed  COMPREHENSIVE METABOLIC PANEL - Abnormal; Notable for the following:    Glucose, Bld 120 (*)    ALT 40 (*)    Total Bilirubin <0.1 (*)      GFR calc non Af Amer 77 (*)    GFR calc Af Amer 90 (*)    Anion gap 3 (*)    All other components within normal limits  URINALYSIS, ROUTINE W REFLEX MICROSCOPIC - Abnormal; Notable for the following:    APPearance CLOUDY (*)    Hgb urine dipstick LARGE (*)    All other components within normal limits  CBC WITH DIFFERENTIAL/PLATELET  URINE MICROSCOPIC-ADD ON    Imaging Review US Abdomen Complete  08/16/2014   CLINICAL DATA:  Abdominal and flank pain  EXAM: ULTRASOUND ABDOMEN COMPLETE  COMPARISON:  08/07/2014  FINDINGS: Gallbladder: Surgically removed  Common bile duct: Diameter: 8 mm  Liver: Fatty infiltrated without focal mass. A small 8 mm hypoechoic  area is noted likely representing a small cyst.  IVC: No abnormality visualized.  Pancreas: Visualized portion unremarkable.  Spleen: Cystic lesion is again identified with some peripheral calcifications similar to that noted on prior CT examination.  Right Kidney: Length: 11.3 cm. A 13 mm echogenic focus is identified in the right renal pelvis consistent with a renal pelvic stone. This is stable from the recent CT examination. This may cause some flank pain related to motion within the renal pelvis.  Left Kidney: Length: 12.3 cm. Echogenicity within normal limits. No mass or hydronephrosis visualized.  Abdominal aorta: No aneurysm visualized.  Other findings: None.  IMPRESSION: Right renal pelvic stone. This may cause some symptomatology with a ball valve affect within the collecting system although no obstructive changes are noted currently.  Hypoechoic structure within the spleen with peripheral calcifications similar to that noted on the prior CT examination.  Fatty liver.  Status post cholecystectomy.   Electronically Signed   By: Inez Catalina M.D.   On: 08/16/2014 20:51     EKG Interpretation None      MDM   Final diagnoses:  Flank pain  Renal colic    59 year old female who was found to have a 14 mm right renal pelvic stone about  a week and a half ago, who has been having persistent right flank pain since that time. She has seen a urologist, who plans to remove the stone somehow. However, patient's appointment for this is not for a few weeks and patient is concerned that her pain is becoming more difficult to control. She is using approximately 3 Percocet per day. She is also taking Keflex for reported urinary infection.  UA today shows blood, but no infection. Labs are reassuring. She is quite well-appearing, with a soft abdomen and a tender right flank. Plan to treat pain and obtain ultrasound.  Pain improved after IV morphine.  US showed renal stone without apparent obstruction.  Pain is somewhat atypical for renal colic, but I don't think there are any other emergent intraabdominal conditions based on time course, workup, and exam.  She had a contrasted CT at the onset of this pain without other significant pathology.  I don't think she needs another CT.  Plan DC home.  She requested second Urologic opinion.  Have provided number for [facility].    Arbie Cookey, MD 08/16/14 [phone]

## 2014-08-16 NOTE — Discharge Instructions (Signed)

## 2014-08-17 ENCOUNTER — Other Ambulatory Visit: Payer: Self-pay | Admitting: Urology

## 2014-08-17 DIAGNOSIS — N200 Calculus of kidney: Secondary | ICD-10-CM

## 2014-08-17 NOTE — H&P (Signed)
Active Problems Problems  1. Calculus of kidney (N20.0)  History of Present Illness Brandi Mahoney is a 59 yo WM who presents for management of a 57mm right renal pelvic stone. She had the onset of pain about 2 weeks ago. The pain was severe with nausea and vomiting. She had no gross hematuria. She was started on keflex for a presumed UTI. The pain is worsening and she had to go back to the ER last night and an US showed no change in the stone. She has had no voiding complaints.  She has had no prior stones. She has had no recent UTI's or GU surgery. She has had no associated signs or symptoms.   Past Medical History Problems  1. History of Gluten intolerance (K90.0) 2. History of esophageal reflux (Z87.19) 3. History of hypercholesterolemia (Z86.39) 4. History of hypothyroidism (Z86.39) 5. History of migraine headaches (Z86.69) 6. History of Hypertriglyceridemia (E78.1) 7. History of Prediabetes (R73.09)  Surgical History Problems  1. History of Appendectomy 2. History of Cholecystectomy 3. History of Hysterectomy  Current Meds 1. Aspirin 81 MG Oral Tablet;  Therapy: (Recorded:29Feb2016) to Recorded 2. Levothyroxine Sodium 50 MCG Oral Tablet;  Therapy: (Recorded:29Feb2016) to Recorded 3. Pravastatin Sodium 40 MG Oral Tablet;  Therapy: (Recorded:29Feb2016) to Recorded 4. Prevacid 15 MG Oral Capsule Delayed Release;  Therapy: (Recorded:29Feb2016) to Recorded 5. Sertraline HCl - 50 MG Oral Tablet;  Therapy: (Recorded:29Feb2016) to Recorded 6. Topamax TABS;  Therapy: (Recorded:29Feb2016) to Recorded 7. Tricor 145 MG Oral Tablet;  Therapy: (Recorded:29Feb2016) to Recorded  Allergies Medication  1. Penicillins 2. NyQuil LIQD  Family History Problems  1. Family history of amyotrophic lateral sclerosis (Z82.0) : Mother 2. Family history of colon cancer (Z80.0) : Maternal Grandfather 3. Family history of malignant neoplasm of breast (Z80.3) : Paternal Grandmother, Sister  Social  History Problems    Alcohol use (Z78.9)   rarely   Caffeine use (F15.90)   2 a day   Former smoker [phone])   Married   Number of children   2 sons     1 daughter   Occupation   Paediatric nurse  Review of Systems Genitourinary, constitutional, skin, eye, otolaryngeal, hematologic/lymphatic, cardiovascular, pulmonary, endocrine, musculoskeletal, gastrointestinal, neurological and psychiatric system(s) were reviewed and pertinent findings if present are noted and are otherwise negative.  Gastrointestinal: nausea, diarrhea and constipation.  Respiratory: shortness of breath.  Musculoskeletal: back pain.  Neurological: headache.    Vitals Vital Signs [Data Includes: Last 1 Day]  Recorded: [id number] 01:50PM  Blood Pressure: 117 / 70 Temperature: 99.1 F Heart Rate: 75 Recorded: 29Feb2016 01:46PM  Height: 5 ft 4 in Weight: 168 lb  BMI Calculated: 28.84 BSA Calculated: 1.82  Physical Exam Constitutional: Well nourished and well developed . No acute distress.  ENT:. The ears and nose are normal in appearance.  Neck: The appearance of the neck is normal and no neck mass is present.  Pulmonary: No respiratory distress and normal respiratory rhythm and effort.  Cardiovascular: Heart rate and rhythm are normal . No peripheral edema.  Abdomen: No masses are palpated. Mild tenderness in the RLQ is present. moderate right CVA tenderness. No hernias are palpable. No hepatosplenomegaly noted.  Skin: Normal skin turgor, no visible rash and no visible skin lesions.  Neuro/Psych:. Mood and affect are appropriate.    Results/Data Urine [Data Includes: Last 1 Day]   [id number]  COLOR YELLOW   APPEARANCE CLEAR   SPECIFIC GRAVITY 1.015   pH 6.0   GLUCOSE NEG mg/dL  BILIRUBIN  NEG   KETONE NEG mg/dL  BLOOD MOD   PROTEIN NEG mg/dL  UROBILINOGEN 0.2 mg/dL  NITRITE NEG   LEUKOCYTE ESTERASE TRACE   SQUAMOUS EPITHELIAL/HPF RARE   WBC 0-2 WBC/hpf  RBC 7-10 RBC/hpf  BACTERIA RARE    CRYSTALS NONE SEEN   CASTS NONE SEEN    Old records or history reviewed: ER records reviewed.  The following images/tracing/specimen were independently visualized:  CT films reviewed. Renal US films reviewed.  The following clinical lab reports were reviewed:  UA reviewed.    Assessment Assessed  1. Calculus of kidney (N20.0)  She has a 7 x 25mm right renal pelvic stone with a density of 1300HU.  She is symptomatic with the stone but has no obstruction.   Plan Health Maintenance  1. UA With REFLEX; [Do Not Release]; Status:Resulted - Requires Verification;   Done:  [id number] 01:36PM Nephrolithiasis  2. Follow-up Schedule Surgery Office  Follow-up  Status: Complete  Done: [id number]  I discussed the options for therapy with her including ESWL, ureteroscopy and PCNL and she had done some internet search and had decided that she wanted to proceed with a PCNL which will have the highest likelihood of rendering her stone free with the fewest possible procedures in exchange for a slightly higher risk of surgical complications.  I revewed the risks of bleeding, infection, renal injury, urine leaks, need for secondary procedures, thrombotic events and anesthetic risks. We will get her set up for later this week.

## 2014-08-18 ENCOUNTER — Encounter (HOSPITAL_COMMUNITY): Payer: Self-pay | Admitting: *Deleted

## 2014-08-18 ENCOUNTER — Other Ambulatory Visit: Payer: Self-pay | Admitting: Radiology

## 2014-08-19 ENCOUNTER — Ambulatory Visit (HOSPITAL_COMMUNITY)
Admission: RE | Admit: 2014-08-19 | Discharge: 2014-08-19 | Disposition: A | Discharge status: Home / Self Care | Payer: PRIVATE HEALTH INSURANCE | Source: Ambulatory Visit | Attending: Urology | Admitting: Urology

## 2014-08-19 ENCOUNTER — Encounter (HOSPITAL_COMMUNITY): Payer: Self-pay

## 2014-08-19 ENCOUNTER — Other Ambulatory Visit: Payer: Self-pay

## 2014-08-19 ENCOUNTER — Ambulatory Visit (HOSPITAL_COMMUNITY)
Admission: RE | Admit: 2014-08-19 | Discharge: 2014-08-19 | Disposition: A | Discharge status: Home / Self Care | Payer: PRIVATE HEALTH INSURANCE | Source: Ambulatory Visit | Attending: Interventional Radiology | Admitting: Interventional Radiology

## 2014-08-19 DIAGNOSIS — I252 Old myocardial infarction: Secondary | ICD-10-CM | POA: Diagnosis not present

## 2014-08-19 DIAGNOSIS — Z7982 Long term (current) use of aspirin: Secondary | ICD-10-CM | POA: Diagnosis not present

## 2014-08-19 DIAGNOSIS — J45909 Unspecified asthma, uncomplicated: Secondary | ICD-10-CM | POA: Diagnosis not present

## 2014-08-19 DIAGNOSIS — E039 Hypothyroidism, unspecified: Secondary | ICD-10-CM | POA: Diagnosis not present

## 2014-08-19 DIAGNOSIS — F329 Major depressive disorder, single episode, unspecified: Secondary | ICD-10-CM | POA: Insufficient documentation

## 2014-08-19 DIAGNOSIS — Z79899 Other long term (current) drug therapy: Secondary | ICD-10-CM | POA: Diagnosis not present

## 2014-08-19 DIAGNOSIS — Z87891 Personal history of nicotine dependence: Secondary | ICD-10-CM | POA: Diagnosis not present

## 2014-08-19 DIAGNOSIS — E785 Hyperlipidemia, unspecified: Secondary | ICD-10-CM | POA: Diagnosis not present

## 2014-08-19 DIAGNOSIS — E119 Type 2 diabetes mellitus without complications: Secondary | ICD-10-CM | POA: Insufficient documentation

## 2014-08-19 DIAGNOSIS — N200 Calculus of kidney: Secondary | ICD-10-CM | POA: Diagnosis not present

## 2014-08-19 LAB — PROTIME-INR
INR: 1.01 (ref 0.00–1.49)
Prothrombin Time: 13.4 seconds (ref 11.6–15.2)

## 2014-08-19 LAB — GLUCOSE, CAPILLARY: Glucose-Capillary: 93 mg/dL (ref 70–99)

## 2014-08-19 LAB — TYPE AND SCREEN
ABO/RH(D): B POS
Antibody Screen: NEGATIVE

## 2014-08-19 LAB — ABO/RH: ABO/RH(D): B POS

## 2014-08-19 IMAGING — US IR URETURAL STENT RIGHT NEW ACCESS W/O SEP NEPHROSTOMY CATH
2 series · 8 of 8 positions shown · non-contrast
Comparison: CT of the abdomen and pelvis - [DATE]

CLINICAL DATA: Renal stones, access for right percutaneous
nephrolithotomy.

EXAM:
IR URETERAL STENT RIGHT NEW ACCESS W/O SEP NEPHROSTOMY CATH
Date: [DATE]
PROCEDURE:
1. Percutaneous puncture of the renal collecting system under
fluoroscopic guidance
2. Placement of a percutaneous nephrostomy tube
TECHNIQUE: Informed written consent was obtained from the {patient} after a
discussion of the risks, benefits, and alternatives to treatment.
The right flank region was prepped with Betadine in a sterile
fashion, and a sterile drape was applied covering the operative
field. A sterile gown and sterile gloves were used for the
procedure. A timeout was performed prior to the initiation of the
procedure.

[Series 1: ir uretural stent right new access w/o sep nephros · non-contrast · 1 of 1 slices shown]
[im 1/1]
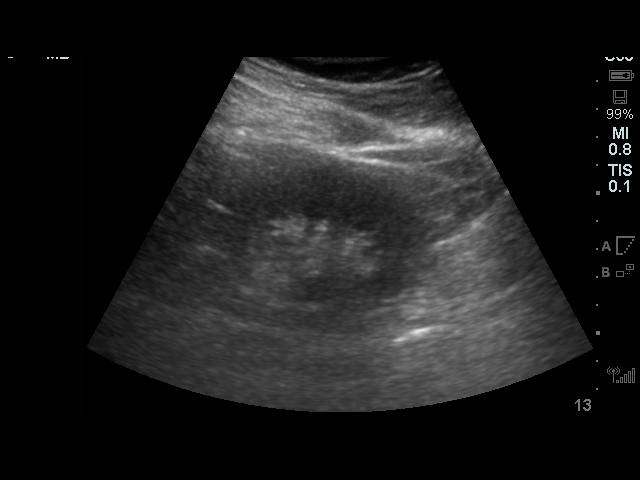

[Series 300: ir uretural stent right new access w/o s · non-contrast · 7 of 7 slices shown]
[im 1/7]
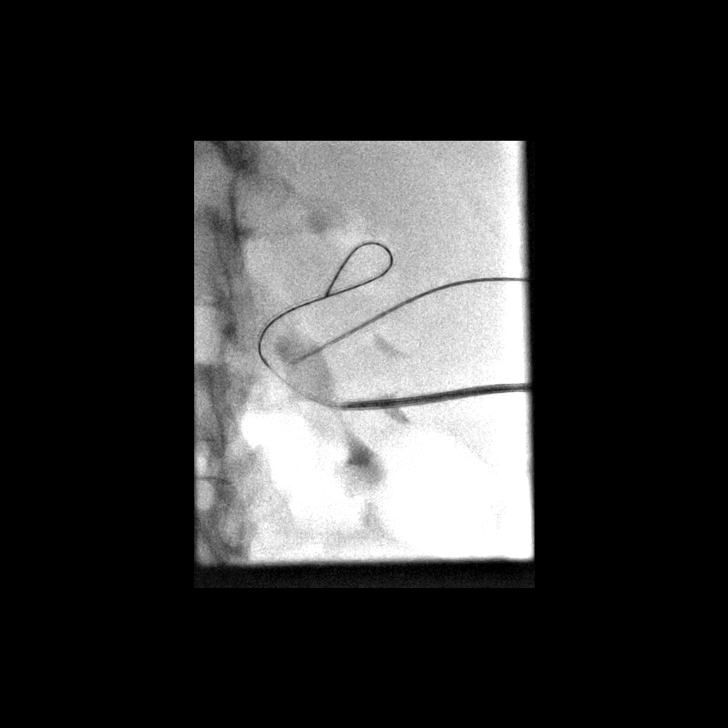
[im 2/7]
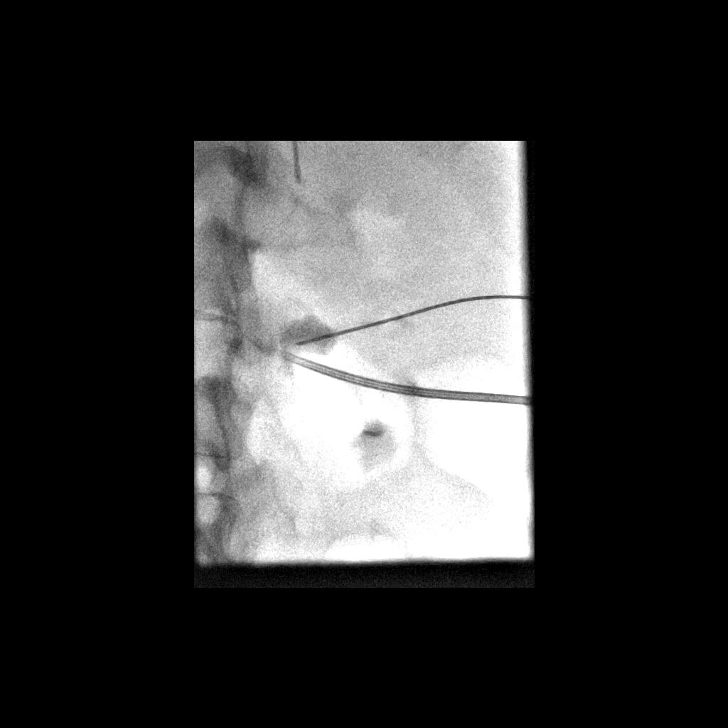
[im 3/7]
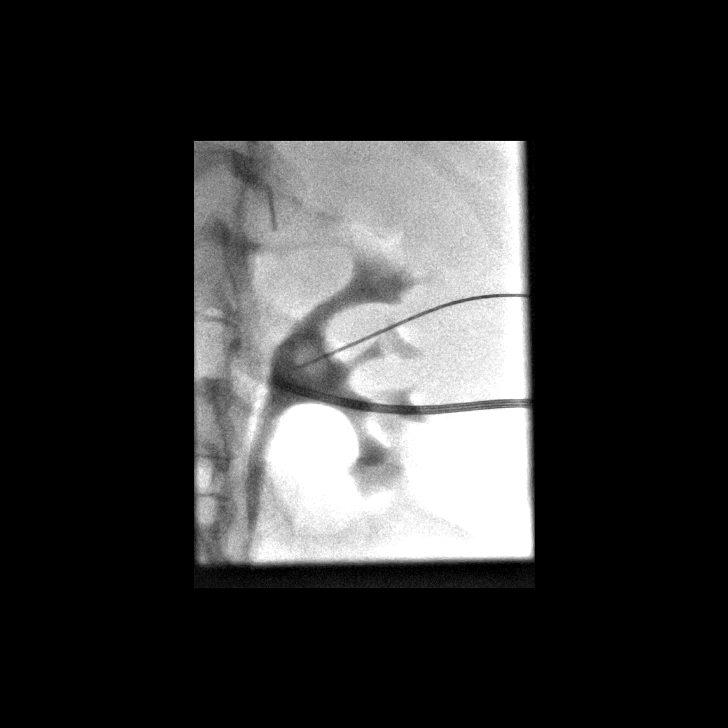
[im 4/7]
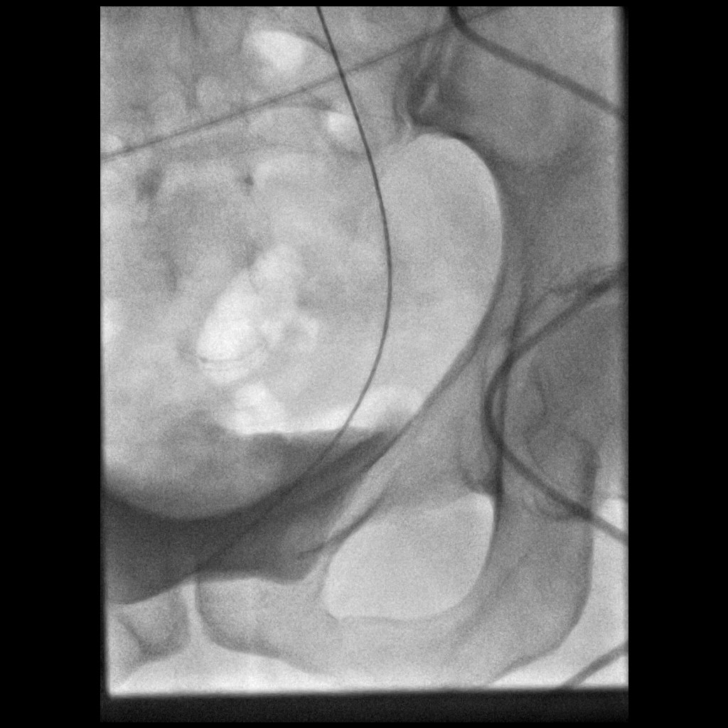
[im 5/7]
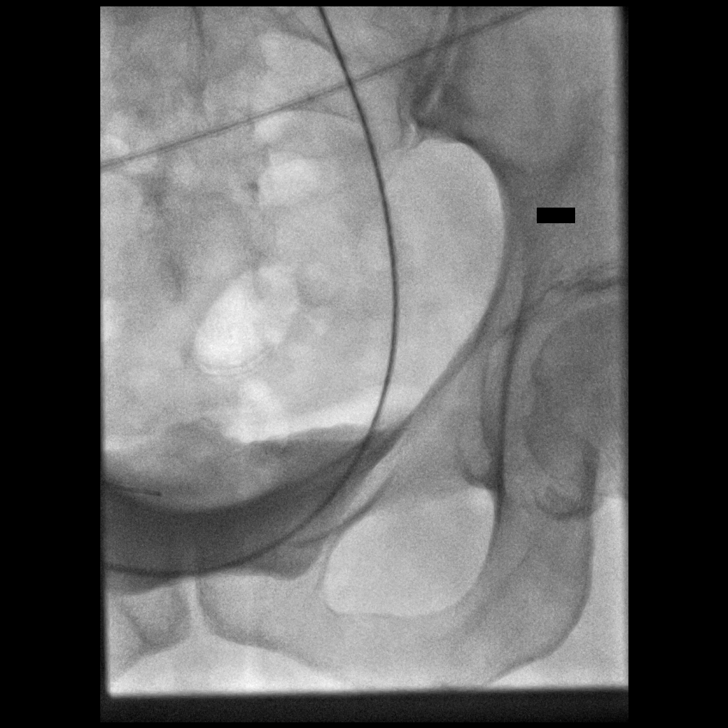
[im 6/7]
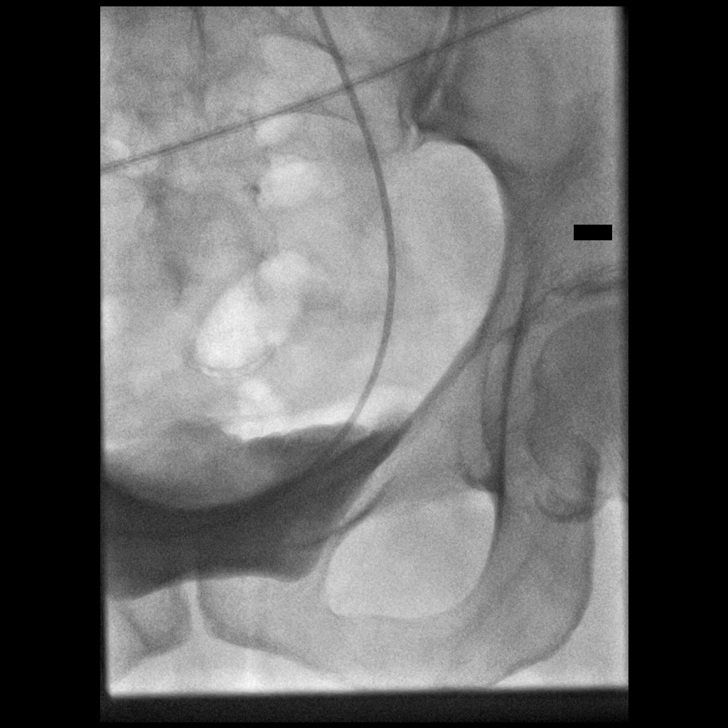
[im 7/7]
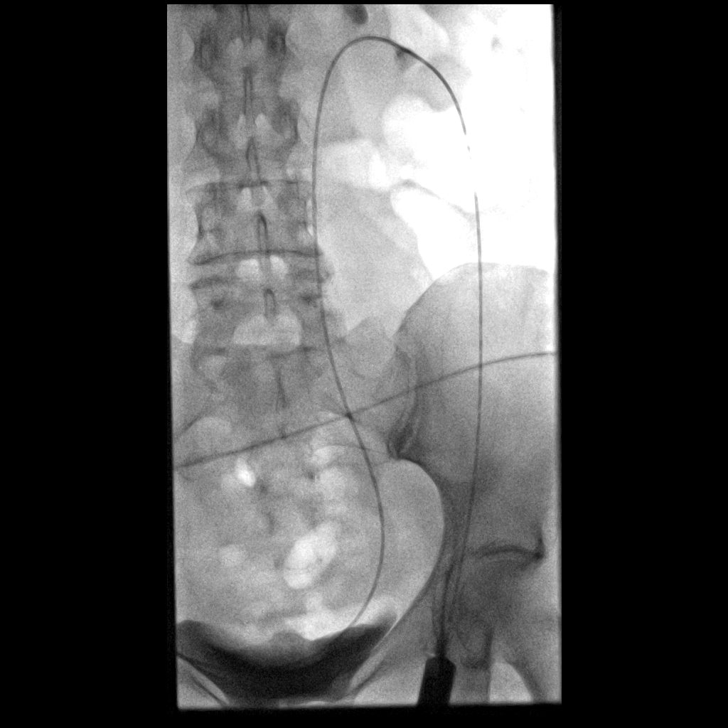

[8 of 8 positions shown; findings below may reference images not displayed]

ANESTHESIA/SEDATION:
Moderate (conscious) sedation was used. 3 mg Versed, 125 mcg
Fentanyl were administered intravenously. The patient's vital signs
were monitored continuously by radiology nursing throughout the
procedure. 400 mg Cipro was administered IV at an appropriate time
prior to skin puncture.

Sedation Time: 27 minutes

FLUOROSCOPY TIME:  6 minutes 12 seconds

101 mGy

CONTRAST:  20 mL Omnipaque 350 instilled into the collecting system.
A pre procedural spot fluoroscopic image was obtained of the upper
abdomen. Ultrasound scanning performed of the kidney was negative
for significant hydronephrosis. As such, the stone within the right
renal pelvis was targeted fluoroscopically with a 22 gauge Chiba
needle. Access to the collecting system was confirmed by a movement
of the stone and aspiration of the urine. Contrast injection
confirmed access. A small amount of air was injected into the
collecting system to help delineate a posterior calyx. A posterior
inferior calyx was targeted with a 22 gauge Chiba needle. Access to
the calyx was confirmed with advancement of a Nitrex wire into the
collecting system. An Accustick set was utilized to dilate the tract
and was subsequently exchanged for a Kumpe catheter over a Bentson
wire. The Kumpe catheter was advanced down the ureter and into the
urinary bladder. Postprocedural spot radiographs were obtained in
various obliquities and the catheter was sutured to the skin. The
catheter was capped and a dressing was placed. The patient tolerated
the procedure well without immediate postprocedural complication.

COMPLICATIONS:
None immediate
IMPRESSION: Successful fluoroscopic guided right percutaneous nephrostomy with
placement of a 5 French Kumpe catheter to the level of the urinary
bladder to be utilized during impending nephrolithotomy procedure.

## 2014-08-19 MED ORDER — LIDOCAINE HCL 1 % IJ SOLN
INTRAMUSCULAR | Status: AC
  Filled 2014-08-19: qty 20

## 2014-08-19 MED ORDER — FENTANYL CITRATE 0.05 MG/ML IJ SOLN
INTRAMUSCULAR | Status: AC
  Filled 2014-08-19: qty 4

## 2014-08-19 MED ORDER — SODIUM CHLORIDE 0.9 % IV SOLN
INTRAVENOUS | Status: DC
Start: 2014-08-19 — End: 2014-08-20
  Administered 2014-08-19: 13:00:00 via INTRAVENOUS

## 2014-08-19 MED ORDER — FENTANYL CITRATE 0.05 MG/ML IJ SOLN
INTRAMUSCULAR | Status: AC | PRN
  Administered 2014-08-19 (×3): 25 ug via INTRAVENOUS
  Administered 2014-08-19: 50 ug via INTRAVENOUS

## 2014-08-19 MED ORDER — ONDANSETRON HCL 4 MG/2ML IJ SOLN
4.0000 mg | Freq: Once | INTRAMUSCULAR | Status: AC
  Administered 2014-08-19: 4 mg via INTRAVENOUS
  Filled 2014-08-19: qty 2

## 2014-08-19 MED ORDER — MIDAZOLAM HCL 2 MG/2ML IJ SOLN
INTRAMUSCULAR | Status: AC | PRN
  Administered 2014-08-19 (×2): 0.5 mg via INTRAVENOUS
  Administered 2014-08-19: 1 mg via INTRAVENOUS
  Administered 2014-08-19 (×2): 0.5 mg via INTRAVENOUS

## 2014-08-19 MED ORDER — CIPROFLOXACIN IN D5W 400 MG/200ML IV SOLN
400.0000 mg | INTRAVENOUS | Status: AC
  Administered 2014-08-19: 400 mg via INTRAVENOUS

## 2014-08-19 MED ORDER — IOHEXOL 300 MG/ML  SOLN
20.0000 mL | Freq: Once | INTRAMUSCULAR | Status: AC | PRN
  Administered 2014-08-19: 20 mL

## 2014-08-19 MED ORDER — CIPROFLOXACIN IN D5W 400 MG/200ML IV SOLN
INTRAVENOUS | Status: AC
  Filled 2014-08-19: qty 200

## 2014-08-19 MED ORDER — MIDAZOLAM HCL 2 MG/2ML IJ SOLN
INTRAMUSCULAR | Status: AC
  Filled 2014-08-19: qty 6

## 2014-08-19 MED ORDER — OXYCODONE-ACETAMINOPHEN 5-325 MG PO TABS
1.0000 | ORAL_TABLET | ORAL | Status: DC | PRN
  Administered 2014-08-19: 1 via ORAL
  Filled 2014-08-19: qty 1

## 2014-08-19 NOTE — H&P (Signed)
Chief Complaint: Right kidney stone  Referring Physician(s): Wrenn,John J  History of Present Illness: Brandi Mahoney is a 59 y.o. female with history of symptomatic 14 mm right renal pelvic stone who presents today for right percutaneous nephrostomy/nephroureteral catheter placement prior to planned nephrolithotomy.  Past Medical History  Diagnosis Date  . Asthma   . Depression   . Migraine headache   . IBS (irritable bowel syndrome)   . Hypothyroidism   . Myocarditis 01/21/2010    hospitalized- [facility]  . Hemorrhoids   . HLD (hyperlipidemia)   . History of pneumonia 1969  . NSTEMI (non-ST elevated myocardial infarction), 01/2010 [facility] 09/19/2013    Secondary to viral myocarditis   . Diabetes mellitus     type II off medication    Past Surgical History  Procedure Laterality Date  . Appendectomy  1974  . Cholecystectomy  2002  . Total abdominal hysterectomy w/ bilateral salpingoophorectomy  2000    no ovaries, uterus, or cervix.  . Cardiac catheterization  8/11    no significant -[facility] Dr Rockey Situ,  . Breast biopsy  1988    Allergies: Pseudoeph-doxylamine-dm-apap; Gluten; and Penicillins  Medications: Prior to Admission medications   Medication Sig Start Date End Date Taking? Authorizing Provider  cephALEXin (KEFLEX) 500 MG capsule Take 500 mg by mouth 2 (two) times daily. 10 day course started 08/12/14   Yes Historical Provider, MD  glucose blood (ACCU-CHEK AVIVA) test strip Use as instructed    Yes Historical Provider, MD  levothyroxine (SYNTHROID, LEVOTHROID) 50 MCG tablet Take 1 tablet (50 mcg total) by mouth daily. 08/10/14  Yes Owens Loffler, MD  oxyCODONE-acetaminophen (PERCOCET/ROXICET) 5-325 MG per tablet Take 1-2 tablets by mouth every 4 (four) hours as needed for severe pain (pain). 08/16/14  Yes Houston Siren III, MD  pantoprazole (PROTONIX) 40 MG tablet Take 1 tablet (40 mg total) by mouth daily. 08/07/14  Yes Amy Cletis [place], MD  pravastatin (PRAVACHOL)  40 MG tablet Take 1 tablet (40 mg total) by mouth at bedtime. Patient taking differently: Take 40 mg by mouth every other day. Alternate with fenofibrate 08/10/14  Yes Spencer Copland, MD  sertraline (ZOLOFT) 50 MG tablet Take one tablet daily. Patient taking differently: Take 50 mg by mouth daily.  08/10/14  Yes Owens Loffler, MD  topiramate (TOPAMAX) 50 MG tablet Take one tablet twice daily Patient taking differently: Take 50 mg by mouth daily.  08/10/14  Yes Owens Loffler, MD  aspirin EC 81 MG tablet Take 81 mg by mouth daily.    Historical Provider, MD  fenofibrate (TRICOR) 145 MG tablet Take 1 tablet (145 mg total) by mouth daily. Patient taking differently: Take 145 mg by mouth every other day. Alternate with pravastatin 08/10/14   Owens Loffler, MD  nitroGLYCERIN (NITROSTAT) 0.4 MG SL tablet 1 tablet under tongue at onset of chest pain; you may repeat every 5 minutes for up to 3 doses. Patient taking differently: Place 0.4 mg under the tongue every 5 (five) minutes as needed for chest pain. you may repeat every 5 minutes for up to 3 doses. 07/21/11   Tonia Ghent, MD    Family History  Problem Relation Age of Onset  . Breast cancer Paternal Grandmother   . Lung cancer Paternal Grandfather     CAD  . Coronary artery disease Mother   . Diabetes Mother   . ALS Mother   . Other Daughter     gluten  . Hyperlipidemia Father   .  Down syndrome Son   . Colon cancer Maternal Grandfather     History   Social History  . Marital Status: Married    Spouse Name: Nicole Kindred  . Number of Children: 3  . Years of Education: N/A   Occupational History  . 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     Social History Main Topics  . Smoking status: Former Smoker -- 0.35 packs/day for 35 years    Types: Cigarettes    Quit date: 09/25/2008  . Smokeless tobacco: Never Used     Comment: quit in 2007-2008, social smoker  . Alcohol Use: Yes     Comment: rarely  . Drug  Use: No  . Sexual Activity: Not on file   Other Topics Concern  . None   Social History Narrative   No reg exercise       Review of Systems  Constitutional: Negative for fever and chills.  Respiratory: Negative for cough and shortness of breath.   Cardiovascular: Negative for chest pain.  Gastrointestinal: Positive for nausea and abdominal pain. Negative for blood in stool.       Occ vomiting recently  Genitourinary: Positive for flank pain. Negative for dysuria and hematuria.  Musculoskeletal: Positive for back pain.  Neurological: Negative for headaches.  Hematological: Does not bruise/bleed easily.    Vital Signs: BP 119/72 mmHg  Pulse 73  Temp(Src) 98.8 F (37.1 C) (Oral)  Resp 16  Ht 5\' 4"  (1.626 m)  Wt 168 lb (76.204 kg)  BMI 28.82 kg/m2  SpO2 98%  Physical Exam  Constitutional: She is oriented to person, place, and time. She appears well-developed and well-nourished.  Cardiovascular: Normal rate and regular rhythm.   Pulmonary/Chest: Effort normal and breath sounds normal.  Abdominal: Soft. Bowel sounds are normal. There is tenderness.  Rt CVA tenderness  Musculoskeletal: Normal range of motion. She exhibits no edema.  Neurological: She is alert and oriented to person, place, and time.    Imaging: US Abdomen Complete  08/16/2014   CLINICAL DATA:  Abdominal and flank pain  EXAM: ULTRASOUND ABDOMEN COMPLETE  COMPARISON:  08/07/2014  FINDINGS: Gallbladder: Surgically removed  Common bile duct: Diameter: 8 mm  Liver: Fatty infiltrated without focal mass. A small 8 mm hypoechoic area is noted likely representing a small cyst.  IVC: No abnormality visualized.  Pancreas: Visualized portion unremarkable.  Spleen: Cystic lesion is again identified with some peripheral calcifications similar to that noted on prior CT examination.  Right Kidney: Length: 11.3 cm. A 13 mm echogenic focus is identified in the right renal pelvis consistent with a renal pelvic stone. This is  stable from the recent CT examination. This may cause some flank pain related to motion within the renal pelvis.  Left Kidney: Length: 12.3 cm. Echogenicity within normal limits. No mass or hydronephrosis visualized.  Abdominal aorta: No aneurysm visualized.  Other findings: None.  IMPRESSION: Right renal pelvic stone. This may cause some symptomatology with a ball valve affect within the collecting system although no obstructive changes are noted currently.  Hypoechoic structure within the spleen with peripheral calcifications similar to that noted on the prior CT examination.  Fatty liver.  Status post cholecystectomy.   Electronically Signed   By: Inez Catalina M.D.   On: 08/16/2014 20:51   Ct Abdomen Pelvis W Contrast  08/07/2014   CLINICAL DATA:  Right upper quadrant pain  EXAM: CT ABDOMEN AND PELVIS WITH CONTRAST  TECHNIQUE: Multidetector CT  imaging of the abdomen and pelvis was performed using the standard protocol following bolus administration of intravenous contrast.  CONTRAST:  186mL OMNIPAQUE IOHEXOL 300 MG/ML  SOLN  COMPARISON:  None.  FINDINGS: Lung bases are free of acute infiltrate or sizable effusion.  The liver is homogeneous in attenuation but diffusely decreased consistent with fatty infiltration. The gallbladder has been surgically removed. The spleen shows a rounded cystic structure which measures 4.8 cm in greatest dimension. It has some peripheral calcification is noted within likely representing a large splenic cysts. This could represent a prior splenic hematoma given the calcification. The adrenal glands and pancreas are within normal limits. The kidneys are well visualized bilaterally. A large right renal stone is noted within the renal pelvis. It measures approximately 14 mm in greatest dimension. No obstructive changes are seen.  The bladder is decompressed. The appendix has been surgically removed. No pelvic mass lesion is seen. The osseous structures show no acute abnormality.   IMPRESSION: 14 mm right renal pelvic stone without obstructive change.  Cystic lesion with peripheral calcifications within the spleen likely representing a mildly complicated splenic cyst or old hematoma.  Fatty liver.  No other focal abnormality is seen.   Electronically Signed   By: Inez Catalina M.D.   On: 08/07/2014 11:56    Labs:  CBC:  Recent Labs  08/06/14 1338 08/16/14 1455  WBC 8.4 7.6  HGB 13.7 13.0  HCT 41.3 39.5  PLT 279.0 290    COAGS:  Recent Labs  08/19/14 1120  INR 1.01    BMP:  Recent Labs  08/06/14 1338 08/16/14 1455  NA 139 137  K 3.9 3.7  CL 106 109  CO2 28 25  GLUCOSE 101* 120*  BUN 12 9  CALCIUM 9.5 8.9  CREATININE 0.83 0.82  GFRNONAA  --  77*  GFRAA  --  90*    LIVER FUNCTION TESTS:  Recent Labs  08/06/14 1338 08/16/14 1455  BILITOT 0.5 <0.1*  AST 35 37  ALT 40* 40*  ALKPHOS 72 64  PROT 7.4 6.7  ALBUMIN 4.5 4.2    TUMOR MARKERS: No results for input(s): AFPTM, CEA, CA199, CHROMGRNA in the last 8760 hours.  Assessment and Plan: Brandi Mahoney is a 59 y.o. female with history of symptomatic 14 mm right renal pelvic stone who presents today for right percutaneous nephrostomy/nephroureteral catheter placement prior to planned nephrolithotomy. Risks and Benefits discussed with the patient/husband including, but not limited to infection, bleeding, significant bleeding causing loss or decrease in renal function or damage to adjacent structures. All of the patient's questions were answered, patient is agreeable to proceed. Consent signed .      Signed: Autumn Messing 08/19/2014, 12:52 PM   I spent a total of 20 minutes face to face in clinical consultation, greater than 50% of which was counseling/coordinating care for right nephroureteral catheter placement.

## 2014-08-19 NOTE — Discharge Instructions (Signed)
Percutaneous Nephrostomy Percutaneous nephrostomy is the insertion of a flexible tube into your kidney through your back. This is done to provide access to an obstructed kidney. The goal of this procedure is to allow the urine that is produced in the kidney to drain, which will relieve pressure or infection from damaging your kidney. This will allow your health care provider to identify the cause of the obstruction and plan appropriate treatment. LET [facility] CARE PROVIDER KNOW ABOUT:  Any allergies you have.  All medicines you are taking, including vitamins, herbs, eye drops, creams, and over-the-counter medicines.  Previous problems you or members of your family have had with the use of anesthetics.  Any blood disorders you have.  Previous surgeries you have had.  Medical conditions you have.  Possibility of pregnancy, if this applies. RISKS AND COMPLICATIONS Generally, this is a safe procedure. However, as with any procedure, problems can occur. Possible problems include:  Infection.  Damage to the organs surrounding your kidney. BEFORE THE PROCEDURE Your health care provider may want you to have blood tests. These tests can help tell how well your kidneys and liver are working. They can also show how well your blood clots. If you take anticoagulant medicine, sometimes called blood thinners, ask your health care provider when you should stop taking them. Make arrangements for someone to take you home after the procedure, if needed. PROCEDURE The procedure is performed as follows:  An intravenous IV catheter will be inserted into one of the veins in your arm. Medicine will be able to flow directly into your body through this catheter. You may be given medicines through this tube to help prevent nausea and pain, and antibiotics to help prevent infection.   You will be placed on your stomach and given medicine that numbs the site (local anesthetic) where the percutaneous  nephrostomy tube will be inserted.  You will be given a medicine that makes you go to sleep (general anesthetic).  The percutaneous nephrostomy tube, which is thin and flexible, will be inserted into a needle.  The needle will be inserted into your body and guided to your kidney with the help of an imaging method that uses X-ray images (fluoroscopy).  A dye will be injected through the nephrostomy tube. Then, X-ray images that highlight your kidney will be taken.  The needle is then removed, but the nephrostomy tube will be left in your kidney. The tube will drain urine from your kidney to a collection bag outside your body. The tube is usually secured to your skin with stitches (sutures). AFTER THE PROCEDURE   You will stay in a recovery area until the sedation has worn off. Your blood pressure and pulse will be checked.  You will need to remain lying down for several hours. Document Released: 03/26/2013 Document Revised: 10/20/2013 Document Reviewed: 03/26/2013 [facility] Patient Information 2015 [place], [place]. This information is not intended to replace advice given to you by your health care provider. Make sure you discuss any questions you have with your health care provider. Conscious Sedation Sedation is the use of medicines to promote relaxation and relieve discomfort and anxiety. Conscious sedation is a type of sedation. Under conscious sedation you are less alert than normal but are still able to respond to instructions or stimulation. Conscious sedation is used during short medical and dental procedures. It is milder than deep sedation or general anesthesia and allows you to return to your regular activities sooner.  [place]  ABOUT:   Any allergies you have.  All medicines you are taking, including vitamins, herbs, eye drops, creams, and over-the-counter medicines.  Use of steroids (by mouth or creams).  Previous problems you or members of your family  have had with the use of anesthetics.  Any blood disorders you have.  Previous surgeries you have had.  Medical conditions you have.  Possibility of pregnancy, if this applies.  Use of cigarettes, alcohol, or illegal drugs. RISKS AND COMPLICATIONS Generally, this is a safe procedure. However, as with any procedure, problems can occur. Possible problems include:  Oversedation.  Trouble breathing on your own. You may need to have a breathing tube until you are awake and breathing on your own.  Allergic reaction to any of the medicines used for the procedure. BEFORE THE PROCEDURE  You may have blood tests done. These tests can help show how well your kidneys and liver are working. They can also show how well your blood clots.  A physical exam will be done.  Only take medicines as directed by your health care provider. You may need to stop taking medicines (such as blood thinners, aspirin, or nonsteroidal anti-inflammatory drugs) before the procedure.   Do not eat or drink at least 6 hours before the procedure or as directed by your health care provider.  Arrange for a responsible adult, family member, or friend to take you home after the procedure. He or she should stay with you for at least 24 hours after the procedure, until the medicine has worn off. PROCEDURE   An intravenous (IV) catheter will be inserted into one of your veins. Medicine will be able to flow directly into your body through this catheter. You may be given medicine through this tube to help prevent pain and help you relax.  The medical or dental procedure will be done. AFTER THE PROCEDURE  You will stay in a recovery area until the medicine has worn off. Your blood pressure and pulse will be checked.   Depending on the procedure you had, you may be allowed to go home when you can tolerate liquids and your pain is under control. Document Released: 02/28/2001 Document Revised: 06/10/2013 Document Reviewed:  02/10/2013 [facility] Patient Information 2015 [place], [place]. This information is not intended to replace advice given to you by your health care provider. Make sure you discuss any questions you have with your health care provider. Percutaneous Nephrostomy, Care After Refer to this sheet in the next few weeks. These instructions provide you with information on caring for yourself after your procedure. Your health care provider may also give you more specific instructions. Your treatment has been planned according to current medical practices, but problems sometimes occur. Call your health care provider if you have any problems or questions after your procedure. WHAT TO EXPECT AFTER THE PROCEDURE You will need to remain lying down for several hours. HOME CARE INSTRUCTIONS  Your nephrostomy tube is connected to a leg bag or bedside drainage bag. Always keep the tubing, the leg bag, or the bedside drainage bags below the level of the kidney so that the urine drains freely.  During the day, if you are connecting the nephrostomy tube to a leg bag, be sure there are no kinks in the tubing and that the urine is draining freely.  At night, you may want to connect the nephrostomy tube or the leg bag to a larger bedside drainage bag.  Change the dressing as often as directed by your health  care provider, or if it becomes wet.  Gently remove the tapes and dressing from around the nephrostomy tube. Be careful not to pull on the tube while removing the dressing.  Wash the skin around the tube, rinse well, and dry.  Place two split drain sponges in and around the tube exit site.  Place tape around edge of the dressing.  Secure the nephrostomy tubing. Remember to make certain that the nephrostomy tube does not kink or become pinched closed. It can be useful to wrap any exposed tubing going from the nephrostomy tube to any of the connecting tubes to either the leg bag or drainage bag with an elastic  bandage.  Every three weeks, replace the leg bag, drainage bag, and any extension tubing connected to your nephrostomy tube. Your health care provider will explain how to change the drainage bag and extension tubing. SEEK MEDICAL CARE IF:  You experience any problems with any of the valves or tubing.  You have persistent pain or soreness in your back.  You have a fever or chills. SEEK IMMEDIATE MEDICAL CARE IF:  You have abdominal pain during the first week.  You have a new appearance of blood in your urine.  You have back pain that is not relieved by your medicine.  You have drainage, redness, swelling, or pain at the tube insertion site.  You have decreased urine output.  Your nephrostomy tube comes out. Document Released: 01/27/2004 Document Revised: 10/20/2013 Document Reviewed: 01/30/2013 [facility] Patient Information 2015 [place], [place]. This information is not intended to replace advice given to you by your health care provider. Make sure you discuss any questions you have with your health care provider.

## 2014-08-19 NOTE — Procedures (Signed)
Interventional Radiology Procedure Note  Procedure: Successful placement of right nephroureteral catheter. Complications: None Recommendations: - To short stay for 2 hr recovery - To OR tomorrow for completion PCNL with Dr. Jeffie Pollock.  Signed,  Criselda Peaches, MD

## 2014-08-20 ENCOUNTER — Ambulatory Visit: Payer: PRIVATE HEALTH INSURANCE | Admitting: Family Medicine

## 2014-08-20 ENCOUNTER — Inpatient Hospital Stay (HOSPITAL_COMMUNITY)
Admission: RE | Admit: 2014-08-20 | Discharge: 2014-08-25 | DRG: 660 | Disposition: A | Discharge status: Home / Self Care | Payer: PRIVATE HEALTH INSURANCE | Source: Ambulatory Visit | Attending: Urology | Admitting: Urology

## 2014-08-20 ENCOUNTER — Ambulatory Visit (HOSPITAL_COMMUNITY): Payer: PRIVATE HEALTH INSURANCE | Admitting: Anesthesiology

## 2014-08-20 ENCOUNTER — Observation Stay (HOSPITAL_COMMUNITY): Payer: PRIVATE HEALTH INSURANCE

## 2014-08-20 ENCOUNTER — Encounter (HOSPITAL_COMMUNITY)
Admission: RE | Disposition: A | Discharge status: Home / Self Care | Payer: Self-pay | Source: Ambulatory Visit | Attending: Urology

## 2014-08-20 ENCOUNTER — Encounter (HOSPITAL_COMMUNITY): Payer: Self-pay | Admitting: *Deleted

## 2014-08-20 DIAGNOSIS — R58 Hemorrhage, not elsewhere classified: Secondary | ICD-10-CM

## 2014-08-20 DIAGNOSIS — E119 Type 2 diabetes mellitus without complications: Secondary | ICD-10-CM | POA: Diagnosis present

## 2014-08-20 DIAGNOSIS — Z803 Family history of malignant neoplasm of breast: Secondary | ICD-10-CM

## 2014-08-20 DIAGNOSIS — D62 Acute posthemorrhagic anemia: Secondary | ICD-10-CM | POA: Diagnosis not present

## 2014-08-20 DIAGNOSIS — Z800 Family history of malignant neoplasm of digestive organs: Secondary | ICD-10-CM

## 2014-08-20 DIAGNOSIS — I2510 Atherosclerotic heart disease of native coronary artery without angina pectoris: Secondary | ICD-10-CM | POA: Diagnosis present

## 2014-08-20 DIAGNOSIS — Z90710 Acquired absence of both cervix and uterus: Secondary | ICD-10-CM

## 2014-08-20 DIAGNOSIS — E039 Hypothyroidism, unspecified: Secondary | ICD-10-CM | POA: Diagnosis present

## 2014-08-20 DIAGNOSIS — Z7982 Long term (current) use of aspirin: Secondary | ICD-10-CM

## 2014-08-20 DIAGNOSIS — K219 Gastro-esophageal reflux disease without esophagitis: Secondary | ICD-10-CM | POA: Diagnosis present

## 2014-08-20 DIAGNOSIS — Z79899 Other long term (current) drug therapy: Secondary | ICD-10-CM

## 2014-08-20 DIAGNOSIS — R5082 Postprocedural fever: Secondary | ICD-10-CM | POA: Diagnosis not present

## 2014-08-20 DIAGNOSIS — I252 Old myocardial infarction: Secondary | ICD-10-CM

## 2014-08-20 DIAGNOSIS — N200 Calculus of kidney: Principal | ICD-10-CM | POA: Diagnosis present

## 2014-08-20 DIAGNOSIS — J90 Pleural effusion, not elsewhere classified: Secondary | ICD-10-CM | POA: Diagnosis not present

## 2014-08-20 DIAGNOSIS — Z87891 Personal history of nicotine dependence: Secondary | ICD-10-CM

## 2014-08-20 DIAGNOSIS — Z9049 Acquired absence of other specified parts of digestive tract: Secondary | ICD-10-CM | POA: Diagnosis present

## 2014-08-20 HISTORY — PX: NEPHROLITHOTOMY: SHX5134

## 2014-08-20 LAB — GLUCOSE, CAPILLARY
GLUCOSE-CAPILLARY: 147 mg/dL — AB (ref 70–99)
GLUCOSE-CAPILLARY: 179 mg/dL — AB (ref 70–99)
GLUCOSE-CAPILLARY: 204 mg/dL — AB (ref 70–99)
GLUCOSE-CAPILLARY: 183 mg/dL — AB (ref 70–99)
Glucose-Capillary: 110 mg/dL — ABNORMAL HIGH (ref 70–99)

## 2014-08-20 LAB — HEMOGLOBIN AND HEMATOCRIT, BLOOD
HCT: 38.9 % (ref 36.0–46.0)
HCT: 29.2 % — ABNORMAL LOW (ref 36.0–46.0)
Hemoglobin: 12.3 g/dL (ref 12.0–15.0)
Hemoglobin: 9.4 g/dL — ABNORMAL LOW (ref 12.0–15.0)

## 2014-08-20 SURGERY — NEPHROLITHOTOMY PERCUTANEOUS
Anesthesia: General | Laterality: Right

## 2014-08-20 MED ORDER — LACTATED RINGERS IV SOLN
INTRAVENOUS | Status: DC
  Administered 2014-08-20 (×2): via INTRAVENOUS
  Administered 2014-08-20: 1000 mL via INTRAVENOUS

## 2014-08-20 MED ORDER — OXYBUTYNIN CHLORIDE 5 MG PO TABS: 5.0000 mg | ORAL_TABLET | Freq: Three times a day (TID) | ORAL | Status: DC | PRN

## 2014-08-20 MED ORDER — CEPHALEXIN 500 MG PO CAPS
500.0000 mg | ORAL_CAPSULE | Freq: Two times a day (BID) | ORAL | Status: DC
  Administered 2014-08-20 – 2014-08-23 (×8): 500 mg via ORAL
  Filled 2014-08-20 (×9): qty 1

## 2014-08-20 MED ORDER — SUCCINYLCHOLINE CHLORIDE 20 MG/ML IJ SOLN
INTRAMUSCULAR | Status: DC | PRN
  Administered 2014-08-20: 100 mg via INTRAVENOUS

## 2014-08-20 MED ORDER — PROMETHAZINE HCL 25 MG/ML IJ SOLN
12.5000 mg | Freq: Once | INTRAMUSCULAR | Status: AC
  Administered 2014-08-20: 12.5 mg via INTRAVENOUS
  Filled 2014-08-20: qty 1

## 2014-08-20 MED ORDER — KCL IN DEXTROSE-NACL 20-5-0.45 MEQ/L-%-% IV SOLN
INTRAVENOUS | Status: DC
  Administered 2014-08-20 – 2014-08-24 (×10): via INTRAVENOUS
  Filled 2014-08-20 (×16): qty 1000

## 2014-08-20 MED ORDER — SODIUM CHLORIDE 0.9 % IR SOLN
Status: DC | PRN
  Administered 2014-08-20: 4000 mL

## 2014-08-20 MED ORDER — INSULIN ASPART 100 UNIT/ML ~~LOC~~ SOLN
0.0000 [IU] | Freq: Three times a day (TID) | SUBCUTANEOUS | Status: DC
  Administered 2014-08-20: 5 [IU] via SUBCUTANEOUS
  Administered 2014-08-21 (×3): 2 [IU] via SUBCUTANEOUS
  Administered 2014-08-22: 3 [IU] via SUBCUTANEOUS
  Administered 2014-08-22 – 2014-08-25 (×5): 2 [IU] via SUBCUTANEOUS

## 2014-08-20 MED ORDER — GLYCOPYRROLATE 0.2 MG/ML IJ SOLN
INTRAMUSCULAR | Status: AC
  Filled 2014-08-20: qty 3

## 2014-08-20 MED ORDER — DOCUSATE SODIUM 100 MG PO CAPS
100.0000 mg | ORAL_CAPSULE | Freq: Two times a day (BID) | ORAL | Status: DC
  Administered 2014-08-21 – 2014-08-25 (×9): 100 mg via ORAL
  Filled 2014-08-20 (×11): qty 1

## 2014-08-20 MED ORDER — CISATRACURIUM BESYLATE 20 MG/10ML IV SOLN
INTRAVENOUS | Status: AC
  Filled 2014-08-20: qty 10

## 2014-08-20 MED ORDER — PANTOPRAZOLE SODIUM 40 MG PO TBEC
40.0000 mg | DELAYED_RELEASE_TABLET | Freq: Every day | ORAL | Status: DC
  Administered 2014-08-21 – 2014-08-25 (×5): 40 mg via ORAL
  Filled 2014-08-20 (×6): qty 1

## 2014-08-20 MED ORDER — NITROGLYCERIN 0.4 MG SL SUBL: 0.4000 mg | SUBLINGUAL_TABLET | SUBLINGUAL | Status: DC | PRN

## 2014-08-20 MED ORDER — OXYCODONE HCL 5 MG PO TABS
5.0000 mg | ORAL_TABLET | ORAL | Status: DC | PRN
  Administered 2014-08-20 – 2014-08-24 (×12): 5 mg via ORAL
  Filled 2014-08-20 (×13): qty 1

## 2014-08-20 MED ORDER — ACETAMINOPHEN 10 MG/ML IV SOLN
1000.0000 mg | Freq: Once | INTRAVENOUS | Status: AC
  Administered 2014-08-20: 1000 mg via INTRAVENOUS
  Filled 2014-08-20: qty 100

## 2014-08-20 MED ORDER — FENOFIBRATE 160 MG PO TABS
160.0000 mg | ORAL_TABLET | Freq: Every day | ORAL | Status: DC
  Administered 2014-08-21 – 2014-08-25 (×5): 160 mg via ORAL
  Filled 2014-08-20 (×6): qty 1

## 2014-08-20 MED ORDER — ONDANSETRON HCL 4 MG/2ML IJ SOLN
INTRAMUSCULAR | Status: DC | PRN
  Administered 2014-08-20: 4 mg via INTRAVENOUS

## 2014-08-20 MED ORDER — FENTANYL CITRATE 0.05 MG/ML IJ SOLN
INTRAMUSCULAR | Status: AC
  Filled 2014-08-20: qty 5

## 2014-08-20 MED ORDER — DEXAMETHASONE SODIUM PHOSPHATE 10 MG/ML IJ SOLN
INTRAMUSCULAR | Status: AC
  Filled 2014-08-20: qty 1

## 2014-08-20 MED ORDER — HYDROMORPHONE HCL 1 MG/ML IJ SOLN
0.2500 mg | INTRAMUSCULAR | Status: DC | PRN
  Administered 2014-08-20 (×2): 0.5 mg via INTRAVENOUS

## 2014-08-20 MED ORDER — PROPOFOL 10 MG/ML IV BOLUS
INTRAVENOUS | Status: AC
  Filled 2014-08-20: qty 20

## 2014-08-20 MED ORDER — GLYCOPYRROLATE 0.2 MG/ML IJ SOLN
INTRAMUSCULAR | Status: DC | PRN
  Administered 2014-08-20: .8 mg via INTRAVENOUS

## 2014-08-20 MED ORDER — NEOSTIGMINE METHYLSULFATE 10 MG/10ML IV SOLN
INTRAVENOUS | Status: DC | PRN
  Administered 2014-08-20: 4 mg via INTRAVENOUS

## 2014-08-20 MED ORDER — PROPOFOL 10 MG/ML IV BOLUS
INTRAVENOUS | Status: DC | PRN
Start: 2014-08-20 — End: 2014-08-20
  Administered 2014-08-20: 150 mg via INTRAVENOUS

## 2014-08-20 MED ORDER — MIDAZOLAM HCL 5 MG/5ML IJ SOLN
INTRAMUSCULAR | Status: DC | PRN
  Administered 2014-08-20: 1 mg via INTRAVENOUS

## 2014-08-20 MED ORDER — BISACODYL 10 MG RE SUPP: 10.0000 mg | Freq: Every day | RECTAL | Status: DC | PRN

## 2014-08-20 MED ORDER — LIDOCAINE HCL (CARDIAC) 20 MG/ML IV SOLN
INTRAVENOUS | Status: AC
  Filled 2014-08-20: qty 5

## 2014-08-20 MED ORDER — ONDANSETRON HCL 4 MG/2ML IJ SOLN: 4.0000 mg | Freq: Once | INTRAMUSCULAR | Status: DC | PRN

## 2014-08-20 MED ORDER — HYDROMORPHONE HCL 1 MG/ML IJ SOLN
0.5000 mg | INTRAMUSCULAR | Status: DC | PRN
  Administered 2014-08-20 – 2014-08-21 (×3): 1 mg via INTRAVENOUS
  Filled 2014-08-20 (×3): qty 1

## 2014-08-20 MED ORDER — PROMETHAZINE HCL 12.5 MG PO TABS: 12.5000 mg | ORAL_TABLET | Freq: Four times a day (QID) | ORAL | Status: DC | PRN

## 2014-08-20 MED ORDER — IOHEXOL 300 MG/ML  SOLN
INTRAMUSCULAR | Status: DC | PRN
  Administered 2014-08-20: 5 mL via INTRAVENOUS

## 2014-08-20 MED ORDER — CIPROFLOXACIN IN D5W 400 MG/200ML IV SOLN
INTRAVENOUS | Status: AC
  Filled 2014-08-20: qty 200

## 2014-08-20 MED ORDER — SERTRALINE HCL 50 MG PO TABS
50.0000 mg | ORAL_TABLET | Freq: Every day | ORAL | Status: DC
  Administered 2014-08-20 – 2014-08-25 (×6): 50 mg via ORAL
  Filled 2014-08-20 (×6): qty 1

## 2014-08-20 MED ORDER — ONDANSETRON HCL 4 MG/2ML IJ SOLN
INTRAMUSCULAR | Status: AC
  Filled 2014-08-20: qty 2

## 2014-08-20 MED ORDER — HYDROMORPHONE HCL 1 MG/ML IJ SOLN
INTRAMUSCULAR | Status: AC
  Filled 2014-08-20: qty 1

## 2014-08-20 MED ORDER — ACETAMINOPHEN 325 MG PO TABS
650.0000 mg | ORAL_TABLET | ORAL | Status: DC | PRN
  Administered 2014-08-22 – 2014-08-23 (×3): 650 mg via ORAL
  Filled 2014-08-20 (×3): qty 2

## 2014-08-20 MED ORDER — FENTANYL CITRATE 0.05 MG/ML IJ SOLN
INTRAMUSCULAR | Status: DC | PRN
  Administered 2014-08-20: 50 ug via INTRAVENOUS
  Administered 2014-08-20: 100 ug via INTRAVENOUS

## 2014-08-20 MED ORDER — CIPROFLOXACIN IN D5W 400 MG/200ML IV SOLN
400.0000 mg | INTRAVENOUS | Status: AC
  Administered 2014-08-20: 400 mg via INTRAVENOUS

## 2014-08-20 MED ORDER — ZOLPIDEM TARTRATE 5 MG PO TABS
5.0000 mg | ORAL_TABLET | Freq: Every evening | ORAL | Status: DC | PRN
  Administered 2014-08-20: 5 mg via ORAL
  Filled 2014-08-20 (×2): qty 1

## 2014-08-20 MED ORDER — BOOST / RESOURCE BREEZE PO LIQD
1.0000 | Freq: Three times a day (TID) | ORAL | Status: DC
  Administered 2014-08-20 – 2014-08-24 (×8): 1 via ORAL

## 2014-08-20 MED ORDER — PRAVASTATIN SODIUM 40 MG PO TABS
40.0000 mg | ORAL_TABLET | ORAL | Status: DC
  Administered 2014-08-22 – 2014-08-24 (×2): 40 mg via ORAL
  Filled 2014-08-20 (×3): qty 1

## 2014-08-20 MED ORDER — TOPIRAMATE 25 MG PO TABS
50.0000 mg | ORAL_TABLET | Freq: Every day | ORAL | Status: DC
  Administered 2014-08-22 – 2014-08-25 (×4): 50 mg via ORAL
  Filled 2014-08-20 (×6): qty 2

## 2014-08-20 MED ORDER — CISATRACURIUM BESYLATE (PF) 10 MG/5ML IV SOLN
INTRAVENOUS | Status: DC | PRN
  Administered 2014-08-20: 6 mg via INTRAVENOUS

## 2014-08-20 MED ORDER — 0.9 % SODIUM CHLORIDE (POUR BTL) OPTIME
TOPICAL | Status: DC | PRN
  Administered 2014-08-20: 1000 mL

## 2014-08-20 MED ORDER — NEOSTIGMINE METHYLSULFATE 10 MG/10ML IV SOLN
INTRAVENOUS | Status: AC
  Filled 2014-08-20: qty 1

## 2014-08-20 MED ORDER — ONDANSETRON HCL 4 MG/2ML IJ SOLN
4.0000 mg | INTRAMUSCULAR | Status: DC | PRN
  Administered 2014-08-20 – 2014-08-24 (×15): 4 mg via INTRAVENOUS
  Filled 2014-08-20 (×15): qty 2

## 2014-08-20 MED ORDER — DEXAMETHASONE SODIUM PHOSPHATE 10 MG/ML IJ SOLN
INTRAMUSCULAR | Status: DC | PRN
  Administered 2014-08-20: 10 mg via INTRAVENOUS

## 2014-08-20 MED ORDER — MIDAZOLAM HCL 2 MG/2ML IJ SOLN
INTRAMUSCULAR | Status: AC
  Filled 2014-08-20: qty 2

## 2014-08-20 MED ORDER — LIDOCAINE HCL (CARDIAC) 20 MG/ML IV SOLN
INTRAVENOUS | Status: DC | PRN
  Administered 2014-08-20: 75 mg via INTRAVENOUS

## 2014-08-20 MED ORDER — ONDANSETRON HCL 4 MG/2ML IJ SOLN
4.0000 mg | Freq: Once | INTRAMUSCULAR | Status: AC
  Administered 2014-08-20: 4 mg via INTRAVENOUS
  Filled 2014-08-20: qty 2

## 2014-08-20 MED ORDER — LEVOTHYROXINE SODIUM 50 MCG PO TABS
50.0000 ug | ORAL_TABLET | Freq: Every day | ORAL | Status: DC
  Administered 2014-08-21 – 2014-08-25 (×5): 50 ug via ORAL
  Filled 2014-08-20 (×6): qty 1

## 2014-08-20 SURGICAL SUPPLY — 48 items
BAG URINE DRAINAGE (UROLOGICAL SUPPLIES) ×2 IMPLANT
BASKET ZERO TIP NITINOL 2.4FR (BASKET) ×2 IMPLANT
BENZOIN TINCTURE PRP APPL 2/3 (GAUZE/BANDAGES/DRESSINGS) ×8 IMPLANT
BLADE SURG 15 STRL LF DISP TIS (BLADE) ×1 IMPLANT
BLADE SURG 15 STRL SS (BLADE) ×1
CARTRIDGE STONEBREAK CO2 KIDNE (ELECTROSURGICAL) IMPLANT
CATH AINSWORTH 30CC 24FR (CATHETERS) ×4 IMPLANT
CATH ROBINSON RED A/P 20FR (CATHETERS) IMPLANT
CATH URET 5FR 28IN OPEN ENDED (CATHETERS) IMPLANT
CATH URET DUAL LUMEN 6-10FR 50 (CATHETERS) ×2 IMPLANT
CATH X-FORCE N30 NEPHROSTOMY (TUBING) ×2 IMPLANT
COVER SURGICAL LIGHT HANDLE (MISCELLANEOUS) ×2 IMPLANT
DRAPE C-ARM 42X120 X-RAY (DRAPES) ×2 IMPLANT
DRAPE CAMERA CLOSED 9X96 (DRAPES) ×2 IMPLANT
DRAPE LINGEMAN PERC (DRAPES) ×2 IMPLANT
DRAPE SURG IRRIG POUCH 19X23 (DRAPES) ×2 IMPLANT
DRSG PAD ABDOMINAL 8X10 ST (GAUZE/BANDAGES/DRESSINGS) ×4 IMPLANT
DRSG TEGADERM 8X12 (GAUZE/BANDAGES/DRESSINGS) ×2 IMPLANT
FIBER LASER FLEXIVA 1000 (UROLOGICAL SUPPLIES) IMPLANT
FIBER LASER FLEXIVA 200 (UROLOGICAL SUPPLIES) IMPLANT
FIBER LASER FLEXIVA 365 (UROLOGICAL SUPPLIES) IMPLANT
FIBER LASER FLEXIVA 550 (UROLOGICAL SUPPLIES) IMPLANT
FIBER LASER TRAC TIP (UROLOGICAL SUPPLIES) IMPLANT
GAUZE SPONGE 4X4 12PLY STRL (GAUZE/BANDAGES/DRESSINGS) ×2 IMPLANT
GLOVE SURG SS PI 8.0 STRL IVOR (GLOVE) IMPLANT
GOWN STRL REUS W/TWL XL LVL3 (GOWN DISPOSABLE) ×2 IMPLANT
GUIDEWIRE AMPLAZ .035X145 (WIRE) ×4 IMPLANT
KIT BASIN OR (CUSTOM PROCEDURE TRAY) ×2 IMPLANT
MANIFOLD NEPTUNE II (INSTRUMENTS) ×2 IMPLANT
MASK EYE SHIELD (GAUZE/BANDAGES/DRESSINGS) ×2 IMPLANT
NS IRRIG 1000ML POUR BTL (IV SOLUTION) ×2 IMPLANT
PACK BASIC VI WITH GOWN DISP (CUSTOM PROCEDURE TRAY) ×2 IMPLANT
PAD ABD 8X10 STRL (GAUZE/BANDAGES/DRESSINGS) ×2 IMPLANT
PROBE KIDNEY STONEBRKR 2.0X425 (ELECTROSURGICAL) IMPLANT
PROBE LITHOCLAST ULTRA 3.8X403 (UROLOGICAL SUPPLIES) IMPLANT
PROBE PNEUMATIC 1.0MMX570MM (UROLOGICAL SUPPLIES) IMPLANT
SET IRRIG Y TYPE TUR BLADDER L (SET/KITS/TRAYS/PACK) ×2 IMPLANT
SHIELD EYE BINOCULAR (MISCELLANEOUS) IMPLANT
SPONGE LAP 4X18 X RAY DECT (DISPOSABLE) ×2 IMPLANT
STONE CATCHER W/TUBE ADAPTER (UROLOGICAL SUPPLIES) IMPLANT
SUT SILK 2 0 30  PSL (SUTURE) ×1
SUT SILK 2 0 30 PSL (SUTURE) ×1 IMPLANT
SYR 20CC LL (SYRINGE) ×4 IMPLANT
SYRINGE 10CC LL (SYRINGE) ×2 IMPLANT
TOWEL OR 17X26 10 PK STRL BLUE (TOWEL DISPOSABLE) ×2 IMPLANT
TOWEL OR NON WOVEN STRL DISP B (DISPOSABLE) ×2 IMPLANT
TRAY FOLEY CATH 16FRSI W/METER (SET/KITS/TRAYS/PACK) ×2 IMPLANT
TUBING CONNECTING 10 (TUBING) ×6 IMPLANT

## 2014-08-20 NOTE — Progress Notes (Signed)
PACU note---right nephrostomy bag changed; old bag with approx 900 cc blood clots, urine , and bloody sediment; new bag applied; minimal bloody drainage now in tubing

## 2014-08-20 NOTE — Anesthesia Postprocedure Evaluation (Signed)
  Anesthesia Post-op Note  Patient: Brandi Mahoney  Procedure(s) Performed: Procedure(s): RIGHT PERCUTANEOUS NEPHROLITHOTOMY  (Right)  Patient Location: PACU  Anesthesia Type:General  Level of Consciousness: awake, alert , oriented and patient cooperative  Airway and Oxygen Therapy: Patient Spontanous Breathing  Post-op Pain: mild  Post-op Assessment: Post-op Vital signs reviewed, Patient's Cardiovascular Status Stable, Respiratory Function Stable, Patent Airway, No signs of Nausea or vomiting and Pain level controlled  Post-op Vital Signs: stable  Last Vitals:  Filed Vitals:   08/20/14 0856  BP: 163/55  Pulse: 88  Temp: 37.1 C  Resp: 14    Complications: No apparent anesthesia complications

## 2014-08-20 NOTE — Progress Notes (Signed)
Patient arrived c/o nausea vomiting and pain. Dr. Redmond School paged for orders.

## 2014-08-20 NOTE — Anesthesia Preprocedure Evaluation (Signed)
Anesthesia Evaluation  Patient identified by MRN, date of birth, ID band Patient awake    Reviewed: Allergy & Precautions, NPO status , Patient's Chart, lab work & pertinent test results  Airway        Dental   Pulmonary asthma , sleep apnea , former smoker,          Cardiovascular + CAD and + Past MI     Neuro/Psych    GI/Hepatic   Endo/Other  diabetes, Type 2, Oral Hypoglycemic AgentsHypothyroidism   Renal/GU Renal disease     Musculoskeletal   Abdominal   Peds  Hematology   Anesthesia Other Findings   Reproductive/Obstetrics                             Anesthesia Physical Anesthesia Plan  ASA: III  Anesthesia Plan: General   Post-op Pain Management:    Induction: Intravenous  Airway Management Planned: Oral ETT  Additional Equipment:   Intra-op Plan:   Post-operative Plan: Extubation in OR  Informed Consent: I have reviewed the patients History and Physical, chart, labs and discussed the procedure including the risks, benefits and alternatives for the proposed anesthesia with the patient or authorized representative who has indicated his/her understanding and acceptance.     Plan Discussed with: CRNA, Anesthesiologist and Surgeon  Anesthesia Plan Comments:         Anesthesia Quick Evaluation

## 2014-08-20 NOTE — Progress Notes (Signed)
Pt's nephrostomy tube has very bloody drainage and clots approx 1000cc with urine and blood clots mixed; Dr. Jeffie Pollock notified; pt turned to her left side with right side up; Dr. Jeffie Pollock in to apply pressure to right flank

## 2014-08-20 NOTE — Transfer of Care (Signed)
Immediate Anesthesia Transfer of Care Note  Patient: Brandi Mahoney  Procedure(s) Performed: Procedure(s): RIGHT PERCUTANEOUS NEPHROLITHOTOMY  (Right)  Patient Location:2  PACU  Anesthesia Type:General  Level of Consciousness: sedated and patient cooperative  Airway & Oxygen Therapy: Patient Spontanous Breathing and Patient connected to face mask oxygen  Post-op Assessment: Report given to RN and Post -op Vital signs reviewed and stable  Post vital signs: Reviewed and stable  Last Vitals: There were no vitals filed for this visit.  Complications: No apparent anesthesia complications

## 2014-08-20 NOTE — Interval H&P Note (Signed)
History and Physical Interval Note:  Right NT placed yesterday with excellent access to the stone.   She has some nausea and pain following the procedure.   08/20/2014 7:11 AM  Brandi Mahoney  has presented today for surgery, with the diagnosis of right renal stone  The various methods of treatment have been discussed with the patient and family. After consideration of risks, benefits and other options for treatment, the patient has consented to  Procedure(s): RIGHT PERCUTANEOUS NEPHROLITHOTOMY  (Right) as a surgical intervention .  The patient's history has been reviewed, patient examined, no change in status, stable for surgery.  I have reviewed the patient's chart and labs.  Questions were answered to the patient's satisfaction.     Kear[place] Evitt J

## 2014-08-20 NOTE — Op Note (Signed)
Brandi Mahoney, Brandi Mahoney              ACCOUNT NO.:  [id number]  MEDICAL RECORD NO.:  [id number]  LOCATION:  [facility]                         FACILITY:  [facility]  PHYSICIAN:  Marshall Cork. Jeffie Pollock, M.D.    DATE OF BIRTH:  May 02, 1956  DATE OF PROCEDURE:  08/20/2014 DATE OF DISCHARGE:                              OPERATIVE REPORT   PROCEDURES: 1. Right percutaneous nephrolithotomy for 14 mm stone. 2. Antegrade nephrostogram.  PREOPERATIVE DIAGNOSIS:  A 14-mm right renal pelvic stone.  POSTOPERATIVE DIAGNOSIS:  A 14-mm right renal pelvic stone.  SURGEON:  Marshall Cork. Jeffie Pollock, M.D.  ANESTHESIA:  General.  SPECIMEN:  Stone.  DRAINS:  A 16-French Foley catheter, 24-French Ainsworth right nephrostomy tube, and 6-French Kumpe right nephrostomy tube.  SPECIMEN:  Stone.  BLOOD LOSS:  100 mL.  COMPLICATIONS:  None.  INDICATIONS:  Brandi Mahoney is a 59 year old white female who presented to the office earlier this week with a 14-mm right renal pelvic stone. She had been symptomatic for 2 weeks.  After reviewing the options, which she had actually researched well with her husband and she has elected to undergo primary percutaneous nephrolithotomy for removal of the stone.  She had undergone placement of the nephrostomy access catheter on August 19, 2014 in Interventional Radiology and the images revealed good position of the tube.  FINDINGS AND PROCEDURE:  She was given Cipro.  She was taken to the operating room where general anesthetic was induced on the holding room stretcher.  Foley catheter was inserted using sterile technique and then she was rolled prone on chest rolls with all pressure points padded on the operating table.  Her nephrostomy access site was prepped with Betadine solution and she was draped in usual sterile fashion.  An Amplatz Super Stiff guidewire was passed through the access catheter into the bladder and the access catheter was removed.  The nephrostomy incision was extended to  2 cm transversely and a double-lumen catheter was passed over the initial wire.  A second safety wire was passed through the double-lumen catheter into the bladder and the double-lumen catheter was removed.  The NephroMax nephrostomy balloon was then inserted over the working wire into the renal pelvis and inflated to 18 atmospheres.  The access sheath was then advanced over the balloon into the renal pelvis.  The balloon was deflated and removed.  Initially, there was fairly brisk bleeding from the nephrostomy sheath and initial inspection was hampered by the bleeding.  Fluoroscopy demonstrated the sheath had backed out of the collecting system into the parenchyma, so the balloon was reinserted over the wire and reinflated and the sheath was negotiated back into the renal pelvis. However, because of the bleeding, I left the balloon, inflated in the nephrostomy tract for 10 minutes to allow tamponade.  At this point, the balloon was deflated and removed once again.  There was no further significant bleeding.  The nephroscope was reinserted with 2 jaw grasper and several small clots were removed.  Eventually, the stone was identified in the renal pelvis.  I was able to grasp it and remove it, intact with the 2 jaw grasper.  Once the stone had been removed, fluoroscopy revealed no residual  fragments.  A 24-French Ainsworth Foley catheter was inserted over the working wire through the sheath into the renal pelvis.  The sheath was backed out approximately 4 cm in the balloon of the catheter was filled with 2.5 mL of sterile water.  A 6-French Kumpe catheter was then inserted and advanced to the distal ureter.  The nephrostomy sheath was then backed out beyond the skin level.  No significant bleeding was noted at this point.  A 2-0 silk suture was used to secure the nephrostomy catheter and the Kumpe the catheter to the skin.  The working wire was removed from the [place] catheter  and contrast was instilled.  This revealed good positioning of the tip of the catheter in the renal pelvis.  There was some clot in the collecting system, but good antegrade flow and no extravasation.  The wire was then removed from the Kumpe catheter and the nephrostomy sheath was cut away from the [facility] catheter.  The nephrostomy catheter was placed to straight drainage and the Ainsworth catheter was capped.  The drapes were removed.  The dressing was applied.  The patient was rolled back supine on the recovery room stretcher, her anesthetic was reversed and she was moved to recovery in stable condition.  There were no complications.     Marshall Cork. Jeffie Pollock, M.D.     JJW/MEDQ  D:  08/20/2014  T:  08/20/2014  Job:  [id number]

## 2014-08-20 NOTE — Progress Notes (Signed)
Patient ID: Brandi Mahoney, female   DOB: Jul 18, 1955, 59 y.o.   MRN: [id number] Day of Surgery  Subjective: Brandi Mahoney had bleeding the the PACU that responded to pressure and positioning.  Her Hgb was 12.3 and her VS are stable.  She has expected postop pain.  The foley and NT are draining bloody urine but it is no longer thick with clots.    ROS:  ROS  Anti-infectives: Anti-infectives    Start     Dose/Rate Route Frequency Ordered Stop   08/20/14 1200  cephALEXin (KEFLEX) capsule 500 mg     500 mg Oral 2 times daily 08/20/14 1104     08/20/14 0537  ciprofloxacin (CIPRO) IVPB 400 mg     400 mg 200 mL/hr over 60 Minutes Intravenous 60 min pre-op 08/20/14 0537 08/20/14 0734      Current Facility-Administered Medications  Medication Dose Route Frequency Provider Last Rate Last Dose  . acetaminophen (TYLENOL) tablet 650 mg  650 mg Oral Q4H PRN Malka So, MD      . bisacodyl (DULCOLAX) suppository 10 mg  10 mg Rectal Daily PRN Malka So, MD      . cephALEXin [facility]) capsule 500 mg  500 mg Oral BID Malka So, MD   500 mg at 08/20/14 1609  . dextrose 5 % and 0.45 % NaCl with KCl 20 mEq/L infusion   Intravenous Continuous Malka So, MD 100 mL/hr at 08/20/14 1153    . docusate sodium (COLACE) capsule 100 mg  100 mg Oral BID Malka So, MD      . feeding supplement (RESOURCE BREEZE) (RESOURCE BREEZE) liquid 1 Container  1 Container Oral TID BM Malka So, MD      . fenofibrate tablet 160 mg  160 mg Oral Daily Malka So, MD      . HYDROmorphone (DILAUDID) 1 MG/ML injection           . HYDROmorphone (DILAUDID) injection 0.5-1 mg  0.5-1 mg Intravenous Q2H PRN Malka So, MD   1 mg at 08/20/14 1454  . insulin aspart (novoLOG) injection 0-15 Units  0-15 Units Subcutaneous TID WC Malka So, MD   0 Units at 08/20/14 1200  . levothyroxine (SYNTHROID, LEVOTHROID) tablet 50 mcg  50 mcg Oral QAC breakfast Malka So, MD      . nitroGLYCERIN (NITROSTAT) SL tablet 0.4 mg  0.4 mg  Sublingual Q5 min PRN Malka So, MD      . ondansetron [facility]) injection 4 mg  4 mg Intravenous Q4H PRN Malka So, MD   4 mg at 08/20/14 1454  . oxybutynin (DITROPAN) tablet 5 mg  5 mg Oral Q8H PRN Malka So, MD      . oxyCODONE (Oxy IR/ROXICODONE) immediate release tablet 5 mg  5 mg Oral Q4H PRN Malka So, MD      . pantoprazole (PROTONIX) EC tablet 40 mg  40 mg Oral Daily Malka So, MD      . pravastatin (PRAVACHOL) tablet 40 mg  40 mg Oral QODAY Malka So, MD      . sertraline (ZOLOFT) tablet 50 mg  50 mg Oral Daily Malka So, MD   50 mg at 08/20/14 1609  . topiramate (TOPAMAX) tablet 50 mg  50 mg Oral Daily Malka So, MD      . zolpidem [facility]) tablet 5 mg  5 mg Oral QHS PRN Malka So, MD  Objective: Vital signs in last 24 hours: Temp:  [97.5 F (36.4 C)-98.8 F (37.1 C)] 97.5 F (36.4 C) (03/03 1046) Pulse Rate:  [64-98] 85 (03/03 1046) Resp:  [10-24] 20 (03/03 1046) BP: (95-163)/(45-74) 122/63 mmHg (03/03 1046) SpO2:  [99 %-100 %] 99 % (03/03 1046) Weight:  [76.204 kg (168 lb)] 76.204 kg (168 lb) (03/03 1046)  Intake/Output from previous day:   Intake/Output this shift: Total I/O In: 2311.7 [I.V.:2311.7] Out: 1325 [Urine:1125; Blood:200]   Physical Exam  Lab Results:   Recent Labs  08/20/14 0906  HGB 12.3  HCT 38.9   BMET No results for input(s): NA, K, CL, CO2, GLUCOSE, BUN, CREATININE, CALCIUM in the last 72 hours. PT/INR  Recent Labs  08/19/14 1120  LABPROT 13.4  INR 1.01   ABG No results for input(s): PHART, HCO3 in the last 72 hours.  Invalid input(s): PCO2, PO2  Studies/Results: Dg C-arm 1-60 Min-no Report  08/20/2014   CLINICAL DATA: surgery   C-ARM 1-60 MINUTES  Fluoroscopy was utilized by the requesting physician.  No radiographic  interpretation.    Ir Ureteral Stent Right New Access W/o Sep Nephrostomy Cath  08/19/2014   CLINICAL DATA:  Renal stones, access for right percutaneous nephrolithotomy.  EXAM:  IR URETERAL STENT RIGHT NEW ACCESS W/O SEP NEPHROSTOMY CATH  Date: 08/19/2014  PROCEDURE: 1. Percutaneous puncture of the renal collecting system under fluoroscopic guidance 2. Placement of a percutaneous nephrostomy tube Operating Physician:  Criselda Peaches, MD  COMPARISON:  CT of the abdomen and pelvis - 08/07/2014  ANESTHESIA/SEDATION: Moderate (conscious) sedation was used. 3 mg Versed, 125 mcg Fentanyl were administered intravenously. The patient's vital signs were monitored continuously by radiology nursing throughout the procedure. 400 mg Cipro was administered IV at an appropriate time prior to skin puncture.  Sedation Time: 27 minutes  FLUOROSCOPY TIME:  6 minutes 12 seconds  101 mGy  CONTRAST:  20 mL Omnipaque 350 instilled into the collecting system.  TECHNIQUE: Informed written consent was obtained from the patient after a discussion of the risks, benefits, and alternatives to treatment. The right flank region was prepped with Betadine in a sterile fashion, and a sterile drape was applied covering the operative field. A sterile gown and sterile gloves were used for the procedure. A timeout was performed prior to the initiation of the procedure.  A pre procedural spot fluoroscopic image was obtained of the upper abdomen. Ultrasound scanning performed of the kidney was negative for significant hydronephrosis. As such, the stone within the right renal pelvis was targeted fluoroscopically with a [place] needle. Access to the collecting system was confirmed by a movement of the stone and aspiration of the urine. Contrast injection confirmed access. A small amount of air was injected into the collecting system to help delineate a posterior calyx. A posterior inferior calyx was targeted with a 22 gauge Chiba needle. Access to the calyx was confirmed with advancement of a Nitrex wire into the collecting system. An [place]ustick set was utilized to dilate the tract and was subsequently exchanged for a Kumpe  catheter over a B[place] wire. The Kumpe catheter was advanced down the ureter and into the urinary bladder. Postprocedural spot radiographs were obtained in various obliquities and the catheter was sutured to the skin. The catheter was capped and a dressing was placed. The patient tolerated the procedure well without immediate postprocedural complication.  COMPLICATIONS: None immediate  IMPRESSION: Successful fluoroscopic guided right percutaneous nephrostomy with placement of a 5 [place] Kumpe  catheter to the level of the urinary bladder to be utilized during impending nephrolithotomy procedure.  Signed,  Criselda Peaches, MD  [facility]  [facility]   Electronically Signed   By: Jacqulynn Cadet M.D.   On: 08/19/2014 17:39     Assessment: s/p Procedure(s): RIGHT PERCUTANEOUS NEPHROLITHOTOMY   Postop hemorrhage.   Plan: Repeat H&H. Increased IVF to 183ml/hr. Maintain bedrest and hold foley removal until the AM. I will probably keep her here until Saturday with the tube to give extra time for the tract to mature.          Obera Stauch J 08/20/2014

## 2014-08-20 NOTE — Discharge Instructions (Addendum)
Percutaneous Nephrolithotomy °Kidney stones can cause a great deal of pain. They can block urine from leaving the body. And they can lead to infection. Kidney stones might pass on their own, or they can be broken up by shock waves from a special machine. But sometimes surgery is needed to get rid of kidney stones. One type of surgery is called percutaneous nephrolithotomy. "Nephro" means kidney, "litho" means stone and "tomy" means removal by surgery. "Percutaneous" means through the skin. This type of surgery needs only a small cut (incision) in the skin. °This surgery may be suggested for various reasons. They include: °· The kidney stones are 2 centimeters wide (about 3/4 inch) or bigger. They also might be oddly shaped. °· Other treatments were tried, but all or some of the kidney stones remain. °· Infection has developed. °· No other treatment can be used. °LET YOUR CAREGIVER KNOW ABOUT:  °· Any allergies. °· All medications you are taking, including: °¨ Herbs, eyedrops, over-the-counter medications and creams. °¨ Blood thinners (anticoagulants), aspirin or other drugs that could affect blood clotting. °· Use of steroids (by mouth or as creams). °· Previous problems with anesthetics, including local anesthetics. °· Possibility of pregnancy, if this applies. °· Any history of blood clots. °· Any history of bleeding or other blood problems. °· Previous surgery. °· Smoking history. °· Other health problems. °RISKS AND COMPLICATIONS  °· During surgery: °¨ Sometimes all the kidney stones cannot be removed through the tube. Then, the percutaneous nephrolithotomy procedure would be stopped. Open surgery would be used to remove the remaining stones. °· Short-term risks from the surgery could include: °¨ Excessive bleeding. °¨ Blood in your urine. °¨ Holes in the kidney. These usually heal on their own. °¨ Pain. °¨ Redness or tenderness at the incision site. °¨ Numbness (loss of feeling) in the area treated. Tingling  also is possible. °¨ A pooling of blood in the wound (hematoma). °¨ Infection. °¨ Slow healing. °· Longer-term possibilities include: °¨ Kidney damage. °¨ Damage to organs near the kidney. °¨ Need for a repeat surgery. °BEFORE THE PROCEDURE °· You may need to take some tests before your surgery. These might include: °¨ Blood tests. °¨ Urine tests. °¨ Tests to make sure your heart is working properly. °· Let your healthcare provider know if you think you might have a urinary tract infection. You will probably need to take an antibiotic to treat this before the surgery. °· Two weeks before your surgery, stop using aspirin and non-steroidal anti-inflammatory drugs (NSAIDs) for pain relief. This includes prescription drugs and over-the-counter drugs such as ibuprofen and naproxen. Also stop taking vitamin E. °· If you take blood-thinners, ask your healthcare provider when you should stop taking them. °· Do not eat or drink for about 8 hours before your surgery. °· You might be asked to shower or wash with a special antibacterial soap before the procedure. °· Arrive at least an hour before the surgery, or whenever your surgeon recommends. This will give you time to check in and fill out any needed paperwork. °PROCEDURE °· The preparation: °¨ You will change into a hospital gown. °¨ You will be given an IV. A needle will be inserted in your arm. Medication will be able to flow directly into your body through this needle. °¨ You might be given a sedative. This medication will help you relax. °¨ You may be given a general anesthetic (a drug that will put you to sleep during the surgery). Or, you may   get a local anesthetic (part of your body will be numb, but you will remain awake).  A catheter (tube) will be put in your bladder to drain urine during and after surgery.  The procedure:  The surgeon will make a small incision in your lower back.  A tube will be inserted through the incision into your kidney.  Each  kidney stone is removed through this tube. Larger stones may first be broken up with a laser (a high-intensity light beam) or other tools.  If a kidney stone has already left the kidney, the surgeon would use a special tool to bring it back in. Then it would be removed through the tube.  After all the stones are taken out, a catheter will be put in. Fluid can build up around the kidney as it heals. The catheter lets this fluid drain out of the body.  A dressing (medicine and bandage) will be put on the incision area.  The surgery usually takes three to four hours. AFTER THE PROCEDURE  You will stay in a recovery area until the anesthesia has worn off. Your blood pressure and pulse will be checked often. You might be given more pain medication.  You will be moved to a hospital room for the rest of your stay.  The catheter that is taking urine out of your body will be taken out within 24 hours.  The day after your surgery, you should be able to walk around. Walking helps prevent blood clots (thick clumps that can block the flow of blood).  You may be asked to do some breathing exercises.  You will be able to have only liquids for a day or two.  Before you go home:  The catheter that is draining fluid from the kidney area is usually taken out.  You will be taught how to care for the incision. Be sure to ask how often the dressing should be changed and when it can get wet.  You also will be told what you should and should not do while your kidney and incisions heal. For instance, you may be urged to walk to prevent blood clots. Document Released: 04/02/2009 Document Revised: 08/28/2011 Document Reviewed: 04/02/2009 [facility] Patient Information 2015 [place], [place]. This information is not intended to replace advice given to you by your health care provider. Make sure you discuss any questions you have with your health care provider.  Change dressing as needed.   Call for fever >101,  severe pain or heavy bleeding.    No lifting over 5 lb for a week.  No driving until next week.   Please hold Aspirin until next Monday.

## 2014-08-20 NOTE — Anesthesia Procedure Notes (Signed)
Procedure Name: Intubation Date/Time: 08/20/2014 7:32 AM Performed by: Dione Booze Pre-anesthesia Checklist: Patient identified, Emergency Drugs available, Suction available and Patient being monitored Patient Re-evaluated:Patient Re-evaluated prior to inductionOxygen Delivery Method: Circle system utilized Preoxygenation: Pre-oxygenation with 100% oxygen Intubation Type: IV induction, Rapid sequence and Cricoid Pressure applied Laryngoscope Size: Miller and 2 Grade View: Grade I Tube type: Oral Tube size: 7.5 mm Number of attempts: 1 Airway Equipment and Method: Stylet Placement Confirmation: ETT inserted through vocal cords under direct vision,  positive ETCO2 and breath sounds checked- equal and bilateral Secured at: 22 cm Tube secured with: Tape Dental Injury: Teeth and Oropharynx as per pre-operative assessment

## 2014-08-20 NOTE — Brief Op Note (Signed)
08/20/2014  8:32 AM  PATIENT:  Brandi Mahoney  59 y.o. female  PRE-OPERATIVE DIAGNOSIS:  right renal stone 56mm  POST-OPERATIVE DIAGNOSIS:  right renal stone 24mm  PROCEDURE:  Procedure(s): RIGHT PERCUTANEOUS NEPHROLITHOTOMY  (Right) <2cm  SURGEON:  Surgeon(s) and Role:    * Malka So, MD - Primary  PHYSICIAN ASSISTANT:   ASSISTANTS: none   ANESTHESIA:   general  EBL:     BLOOD ADMINISTERED:none  DRAINS: Urinary Catheter (Foley) and 11fr right nephrostomy tube and 50fr right nephrostomy catheter   LOCAL MEDICATIONS USED:  NONE  SPECIMEN:  Source of Specimen:  stone  DISPOSITION OF SPECIMEN:  to family  COUNTS:  YES  TOURNIQUET:  * No tourniquets in log *  DICTATION: .Other Dictation: Dictation Number [id number]  PLAN OF CARE: Admit for overnight observation  PATIENT DISPOSITION:  PACU - hemodynamically stable.   Delay start of Pharmacological VTE agent (>24hrs) due to surgical blood loss or risk of bleeding: yes

## 2014-08-20 NOTE — Progress Notes (Signed)
Patient ID: Brandi Mahoney, female   DOB: 06-14-1956, 59 y.o.   MRN: [id number] Her repeat Hgb was down to 9.4 which would be consistent with the postop blood loss of close to 1016ml noted in the nephrostomy bag in PACU.   She remains hemodynamically stable.  I will repeat the labs in the morning.

## 2014-08-21 ENCOUNTER — Encounter (HOSPITAL_COMMUNITY): Payer: Self-pay | Admitting: Urology

## 2014-08-21 LAB — BASIC METABOLIC PANEL
ANION GAP: 6 (ref 5–15)
BUN: 10 mg/dL (ref 6–23)
CALCIUM: 8.4 mg/dL (ref 8.4–10.5)
CO2: 23 mmol/L (ref 19–32)
CREATININE: 0.97 mg/dL (ref 0.50–1.10)
Chloride: 108 mmol/L (ref 96–112)
GFR, EST AFRICAN AMERICAN: 73 mL/min — AB (ref >90)
GFR, EST NON AFRICAN AMERICAN: 63 mL/min — AB (ref >90)
Glucose, Bld: 163 mg/dL — ABNORMAL HIGH (ref 70–99)
Potassium: 3.8 mmol/L (ref 3.5–5.1)
SODIUM: 137 mmol/L (ref 135–145)

## 2014-08-21 LAB — HEMOGLOBIN AND HEMATOCRIT, BLOOD
HCT: 26.1 % — ABNORMAL LOW (ref 36.0–46.0)
Hemoglobin: 8.3 g/dL — ABNORMAL LOW (ref 12.0–15.0)

## 2014-08-21 LAB — GLUCOSE, CAPILLARY
GLUCOSE-CAPILLARY: 143 mg/dL — AB (ref 70–99)
Glucose-Capillary: 139 mg/dL — ABNORMAL HIGH (ref 70–99)
Glucose-Capillary: 151 mg/dL — ABNORMAL HIGH (ref 70–99)
Glucose-Capillary: 149 mg/dL — ABNORMAL HIGH (ref 70–99)

## 2014-08-21 MED ORDER — FERROUS SULFATE 325 (65 FE) MG PO TABS
325.0000 mg | ORAL_TABLET | Freq: Three times a day (TID) | ORAL | Status: DC
  Administered 2014-08-21 – 2014-08-25 (×10): 325 mg via ORAL
  Filled 2014-08-21 (×15): qty 1

## 2014-08-21 MED ORDER — HYDROMORPHONE HCL 1 MG/ML IJ SOLN
1.0000 mg | INTRAMUSCULAR | Status: DC | PRN
  Administered 2014-08-21 – 2014-08-24 (×10): 2 mg via INTRAVENOUS
  Filled 2014-08-21 (×11): qty 2

## 2014-08-21 MED ORDER — ACETAMINOPHEN 10 MG/ML IV SOLN
1000.0000 mg | Freq: Four times a day (QID) | INTRAVENOUS | Status: AC
  Administered 2014-08-21 (×3): 1000 mg via INTRAVENOUS
  Filled 2014-08-21 (×5): qty 100

## 2014-08-21 MED ORDER — SIMETHICONE 80 MG PO CHEW
80.0000 mg | CHEWABLE_TABLET | Freq: Four times a day (QID) | ORAL | Status: DC | PRN
  Administered 2014-08-21 – 2014-08-23 (×2): 80 mg via ORAL
  Filled 2014-08-21 (×3): qty 1

## 2014-08-21 NOTE — Progress Notes (Signed)
Patient ID: Brandi Mahoney, female   DOB: [birth year], 59 y.o.   MRN: [id number] 1 Day Post-Op  Subjective: Brandi Mahoney is POD #1 from a right PCNL for a 57mm renal pelvic stone.   She had acute bleeding in the PACU and has an acute blood loss anemia.  Her Hgb was 9.4 last night and is now 8.3.   It was 12.3 in the PACU.  She has pain in the right flank that is not well controlled with current dose of pain med but she has no other complaints.   She has had good UOP through the night but the NT has stopped draining which could be secondary to clots.   Her foley was removed this morning.   ROS:  Review of Systems  Constitutional: Negative for fever.  Gastrointestinal: Negative for nausea and vomiting.  Genitourinary: Positive for flank pain.  Musculoskeletal: Negative.     Anti-infectives: Anti-infectives    Start     Dose/Rate Route Frequency Ordered Stop   08/20/14 1200  cephALEXin (KEFLEX) capsule 500 mg     500 mg Oral 2 times daily 08/20/14 1104     08/20/14 0537  ciprofloxacin (CIPRO) IVPB 400 mg     400 mg 200 mL/hr over 60 Minutes Intravenous 60 min pre-op 08/20/14 0537 08/20/14 0734      Current Facility-Administered Medications  Medication Dose Route Frequency Provider Last Rate Last Dose  . acetaminophen (OFIRMEV) IV 1,000 mg  1,000 mg Intravenous 4 times per day Malka So, MD      . acetaminophen (TYLENOL) tablet 650 mg  650 mg Oral Q4H PRN Malka So, MD      . bisacodyl (DULCOLAX) suppository 10 mg  10 mg Rectal Daily PRN Malka So, MD      . cephALEXin [facility]) capsule 500 mg  500 mg Oral BID Malka So, MD   500 mg at 08/20/14 2123  . dextrose 5 % and 0.45 % NaCl with KCl 20 mEq/L infusion   Intravenous Continuous Malka So, MD 150 mL/hr at 08/21/14 0259    . docusate sodium (COLACE) capsule 100 mg  100 mg Oral BID Malka So, MD   100 mg at 08/20/14 1200  . feeding supplement (RESOURCE BREEZE) (RESOURCE BREEZE) liquid 1 Container  1 Container Oral TID BM Malka So, MD   1 Container at 08/20/14 2123  . fenofibrate tablet 160 mg  160 mg Oral Daily Malka So, MD   160 mg at 08/20/14 1200  . ferrous sulfate tablet 325 mg  325 mg Oral TID WC Malka So, MD      . HYDROmorphone (DILAUDID) injection 1-2 mg  1-2 mg Intravenous Q2H PRN Malka So, MD      . insulin aspart (novoLOG) injection 0-15 Units  0-15 Units Subcutaneous TID WC Malka So, MD   5 Units at 08/20/14 1804  . levothyroxine (SYNTHROID, LEVOTHROID) tablet 50 mcg  50 mcg Oral QAC breakfast Malka So, MD   50 mcg at 08/20/14 1200  . nitroGLYCERIN (NITROSTAT) SL tablet 0.4 mg  0.4 mg Sublingual Q5 min PRN Malka So, MD      . ondansetron [facility]) injection 4 mg  4 mg Intravenous Q4H PRN Malka So, MD   4 mg at 08/21/14 0300  . oxybutynin (DITROPAN) tablet 5 mg  5 mg Oral Q8H PRN Malka So, MD      . oxyCODONE (Oxy IR/ROXICODONE)  immediate release tablet 5 mg  5 mg Oral Q4H PRN Malka So, MD   5 mg at 08/21/14 0259  . pantoprazole (PROTONIX) EC tablet 40 mg  40 mg Oral Daily Malka So, MD   40 mg at 08/20/14 1200  . pravastatin (PRAVACHOL) tablet 40 mg  40 mg Oral QODAY Malka So, MD   40 mg at 08/20/14 1200  . sertraline (ZOLOFT) tablet 50 mg  50 mg Oral Daily Malka So, MD   50 mg at 08/20/14 1609  . topiramate (TOPAMAX) tablet 50 mg  50 mg Oral Daily Malka So, MD   50 mg at 08/20/14 1200  . zolpidem (AMBIEN) tablet 5 mg  5 mg Oral QHS PRN Malka So, MD   5 mg at 08/20/14 2125     Objective: Vital signs in last 24 hours: Temp:  [97.5 F (36.4 C)-99.6 F (37.6 C)] 98.5 F (36.9 C) (03/04 0509) Pulse Rate:  [78-107] 101 (03/04 0509) Resp:  [10-24] 18 (03/04 0509) BP: (95-163)/(45-66) 160/66 mmHg (03/04 0509) SpO2:  [96 %-100 %] 97 % (03/04 0509) Weight:  [76.204 kg (168 lb)] 76.204 kg (168 lb) (03/03 1046)  Intake/Output from previous day: 03/03 0701 - 03/04 0700 In: 4839.2 [P.O.:360; I.V.:4479.2] Out: 2479 [Urine:2279;  Blood:200] Intake/Output this shift: Total I/O In: 1900 [P.O.:240; I.V.:1660] Out: 1154 [Urine:1154]   Physical Exam  Constitutional: She is well-developed, well-nourished, and in no distress.  But in some pain  Cardiovascular: Normal rate and regular rhythm.   Pulmonary/Chest: Effort normal and breath sounds normal.  Abdominal: Soft. There is tenderness (RUQ and flank.   BS are quiet.  NT dressing is dry. ).  Musculoskeletal: Normal range of motion. She exhibits no edema or tenderness.  Skin: Skin is warm and dry.    Lab Results:   Recent Labs  08/20/14 1700 08/21/14 0404  HGB 9.4* 8.3*  HCT 29.2* 26.1*   BMET  Recent Labs  08/21/14 0404  NA 137  K 3.8  CL 108  CO2 23  GLUCOSE 163*  BUN 10  CREATININE 0.97  CALCIUM 8.4   PT/INR  Recent Labs  08/19/14 1120  LABPROT 13.4  INR 1.01   ABG No results for input(s): PHART, HCO3 in the last 72 hours.  Invalid input(s): PCO2, PO2  Studies/Results: Dg C-arm 1-60 Min-no Report  08/20/2014   CLINICAL DATA: surgery   C-ARM 1-60 MINUTES  Fluoroscopy was utilized by the requesting physician.  No radiographic  interpretation.    Ir Ureteral Stent Right New Access W/o Sep Nephrostomy Cath  08/19/2014   CLINICAL DATA:  Renal stones, access for right percutaneous nephrolithotomy.  EXAM: IR URETERAL STENT RIGHT NEW ACCESS W/O SEP NEPHROSTOMY CATH  Date: 08/19/2014  PROCEDURE: 1. Percutaneous puncture of the renal collecting system under fluoroscopic guidance 2. Placement of a percutaneous nephrostomy tube Operating Physician:  Criselda Peaches, MD  COMPARISON:  CT of the abdomen and pelvis - 08/07/2014  ANESTHESIA/SEDATION: Moderate (conscious) sedation was used. 3 mg Versed, 125 mcg Fentanyl were administered intravenously. The patient's vital signs were monitored continuously by radiology nursing throughout the procedure. 400 mg Cipro was administered IV at an appropriate time prior to skin puncture.  Sedation Time: 27 minutes   FLUOROSCOPY TIME:  6 minutes 12 seconds  101 mGy  CONTRAST:  20 mL Omnipaque 350 instilled into the collecting system.  TECHNIQUE: Informed written consent was obtained from the patient after a discussion of the risks, benefits,  and alternatives to treatment. The right flank region was prepped with Betadine in a sterile fashion, and a sterile drape was applied covering the operative field. A sterile gown and sterile gloves were used for the procedure. A timeout was performed prior to the initiation of the procedure.  A pre procedural spot fluoroscopic image was obtained of the upper abdomen. Ultrasound scanning performed of the kidney was negative for significant hydronephrosis. As such, the stone within the right renal pelvis was targeted fluoroscopically with a Kimberly needle. Access to the collecting system was confirmed by a movement of the stone and aspiration of the urine. Contrast injection confirmed access. A small amount of air was injected into the collecting system to help delineate a posterior calyx. A posterior inferior calyx was targeted with a 22 gauge Chiba needle. Access to the calyx was confirmed with advancement of a Nitrex wire into the collecting system. An Accustick set was utilized to dilate the tract and was subsequently exchanged for a Kumpe catheter over a Bentson wire. The Kumpe catheter was advanced down the ureter and into the urinary bladder. Postprocedural spot radiographs were obtained in various obliquities and the catheter was sutured to the skin. The catheter was capped and a dressing was placed. The patient tolerated the procedure well without immediate postprocedural complication.  COMPLICATIONS: None immediate  IMPRESSION: Successful fluoroscopic guided right percutaneous nephrostomy with placement of a 5 French Kumpe catheter to the level of the urinary bladder to be utilized during impending nephrolithotomy procedure.  Signed,  Criselda Peaches, MD  [facility]  [facility]   Electronically Signed   By: Jacqulynn Cadet M.D.   On: 08/19/2014 17:39     Assessment: s/p Procedure(s): RIGHT PERCUTANEOUS NEPHROLITHOTOMY   She has an acute blood loss anemia but her Hgb remains above 8 and her VS are stable.  Her pain is not well controlled.  Plan: I will recheck and H&H in the morning.  Dilaudid dose increased and I added IV tylenol for better pain control.   IVF rate decreased.  Begin ambulation today.   Reassess for possible discharge in the morning.   If the NT is draining clear or light pink urine, it can be removed prior to discharge.  If it is not draining, a nephrostogram might be worthwhile to assess position and patency.   I will start oral iron sulfate tid.           Auriana Scalia J 08/21/2014

## 2014-08-21 NOTE — Progress Notes (Signed)
UR completed 

## 2014-08-22 DIAGNOSIS — J90 Pleural effusion, not elsewhere classified: Secondary | ICD-10-CM | POA: Diagnosis not present

## 2014-08-22 DIAGNOSIS — Z79899 Other long term (current) drug therapy: Secondary | ICD-10-CM | POA: Diagnosis not present

## 2014-08-22 DIAGNOSIS — Z87891 Personal history of nicotine dependence: Secondary | ICD-10-CM | POA: Diagnosis not present

## 2014-08-22 DIAGNOSIS — R5082 Postprocedural fever: Secondary | ICD-10-CM | POA: Diagnosis not present

## 2014-08-22 DIAGNOSIS — E119 Type 2 diabetes mellitus without complications: Secondary | ICD-10-CM | POA: Diagnosis present

## 2014-08-22 DIAGNOSIS — N200 Calculus of kidney: Secondary | ICD-10-CM | POA: Diagnosis present

## 2014-08-22 DIAGNOSIS — Z9049 Acquired absence of other specified parts of digestive tract: Secondary | ICD-10-CM | POA: Diagnosis present

## 2014-08-22 DIAGNOSIS — I252 Old myocardial infarction: Secondary | ICD-10-CM | POA: Diagnosis not present

## 2014-08-22 DIAGNOSIS — Z90710 Acquired absence of both cervix and uterus: Secondary | ICD-10-CM | POA: Diagnosis not present

## 2014-08-22 DIAGNOSIS — D62 Acute posthemorrhagic anemia: Secondary | ICD-10-CM | POA: Diagnosis not present

## 2014-08-22 DIAGNOSIS — Z803 Family history of malignant neoplasm of breast: Secondary | ICD-10-CM | POA: Diagnosis not present

## 2014-08-22 DIAGNOSIS — K219 Gastro-esophageal reflux disease without esophagitis: Secondary | ICD-10-CM | POA: Diagnosis present

## 2014-08-22 DIAGNOSIS — E039 Hypothyroidism, unspecified: Secondary | ICD-10-CM | POA: Diagnosis present

## 2014-08-22 DIAGNOSIS — I2510 Atherosclerotic heart disease of native coronary artery without angina pectoris: Secondary | ICD-10-CM | POA: Diagnosis present

## 2014-08-22 DIAGNOSIS — Z800 Family history of malignant neoplasm of digestive organs: Secondary | ICD-10-CM | POA: Diagnosis not present

## 2014-08-22 DIAGNOSIS — Z7982 Long term (current) use of aspirin: Secondary | ICD-10-CM | POA: Diagnosis not present

## 2014-08-22 LAB — HEMOGLOBIN AND HEMATOCRIT, BLOOD
HCT: 22.7 % — ABNORMAL LOW (ref 36.0–46.0)
Hemoglobin: 7.5 g/dL — ABNORMAL LOW (ref 12.0–15.0)

## 2014-08-22 LAB — GLUCOSE, CAPILLARY
GLUCOSE-CAPILLARY: 165 mg/dL — AB (ref 70–99)
Glucose-Capillary: 133 mg/dL — ABNORMAL HIGH (ref 70–99)
Glucose-Capillary: 166 mg/dL — ABNORMAL HIGH (ref 70–99)
Glucose-Capillary: 119 mg/dL — ABNORMAL HIGH (ref 70–99)

## 2014-08-22 NOTE — Progress Notes (Signed)
UR completed 

## 2014-08-22 NOTE — Progress Notes (Signed)
2 Days Post-Op Subjective: Patient reports having pain only when she moves. She has been getting up and ambulating and reports that she is not having any dizziness when she does this. Her Foley catheter was removed yesterday and she is voiding and send her urine is clearing.  Objective: Vital signs in last 24 hours: Temp:  [98.2 F (36.8 C)-98.6 F (37 C)] 98.2 F (36.8 C) (03/05 0449) Pulse Rate:  [89-120] 120 (03/05 0449) Resp:  [18-20] 20 (03/05 0449) BP: (131-142)/(61-63) 131/63 mmHg (03/05 0449) SpO2:  [92 %-94 %] 94 % (03/05 0449)  Intake/Output from previous day: 03/04 0701 - 03/05 0700 In: 3205 [P.O.:720; I.V.:2275; IV Piggyback:200] Out: 450 [Urine:450] Intake/Output this shift:    Physical Exam:  She is awake, alert and oriented. She is not in any apparent distress while lying in bed. Her abdomen is soft. Her nephrostomy tube is in place and is draining bloody urine with no clots. I see no flank ecchymoses.  Lab Results:  Recent Labs  08/20/14 1700 08/21/14 0404 08/22/14 0350  HGB 9.4* 8.3* 7.5*  HCT 29.2* 26.1* 22.7*   BMET  Recent Labs  08/21/14 0404  NA 137  K 3.8  CL 108  CO2 23  GLUCOSE 163*  BUN 10  CREATININE 0.97  CALCIUM 8.4    Recent Labs  08/19/14 1120  INR 1.01   No results for input(s): LABURIN in the last 72 hours. Results for orders placed or performed in visit on 05/15/12  Wound culture     Status: None   Collection Time: 05/15/12  5:08 PM  Result Value Ref Range Status   Gram Stain Moderate  Final   Gram Stain WBC present-predominately PMN  Final   Gram Stain Rare Squamous Epithelial Cells Present  Final   Gram Stain   Final    Moderate GRAM POSITIVE COCCI IN PAIRS In Clusters In Chains   Organism ID, Bacteria Multiple Organisms Present,None Predominant  Final    Comment: No Staphylococcus aureus isolated NOHBA    Studies/Results: Dg C-arm 1-60 Min-no Report  08/20/2014   CLINICAL DATA: surgery   C-ARM 1-60 MINUTES   Fluoroscopy was utilized by the requesting physician.  No radiographic  interpretation.     Assessment/Plan: She is stable with no tachycardia or hypotension and has no orthostasis but her hemoglobin has continued to drift down. I suspect that there is no continued bleeding although I will recheck her hemoglobin again in the morning. Despite the fact that she is having pain with movement she has been ambulating and her nephrostomy tube appears to be in good position as it is draining urine now.  Continued observation  Recheck H&H in a.m.     Brandi Mahoney C 08/22/2014, 8:05 AM

## 2014-08-23 ENCOUNTER — Inpatient Hospital Stay (HOSPITAL_COMMUNITY): Payer: PRIVATE HEALTH INSURANCE

## 2014-08-23 LAB — CBC
HCT: 26 % — ABNORMAL LOW (ref 36.0–46.0)
Hemoglobin: 8.5 g/dL — ABNORMAL LOW (ref 12.0–15.0)
MCH: 28.1 pg (ref 26.0–34.0)
MCHC: 32.7 g/dL (ref 30.0–36.0)
MCV: 86.1 fL (ref 78.0–100.0)
Platelets: 256 10*3/uL (ref 150–400)
RBC: 3.02 MIL/uL — ABNORMAL LOW (ref 3.87–5.11)
RDW: 14.4 % (ref 11.5–15.5)
WBC: 12.1 10*3/uL — ABNORMAL HIGH (ref 4.0–10.5)

## 2014-08-23 LAB — GLUCOSE, CAPILLARY
GLUCOSE-CAPILLARY: 123 mg/dL — AB (ref 70–99)
Glucose-Capillary: 125 mg/dL — ABNORMAL HIGH (ref 70–99)
Glucose-Capillary: 116 mg/dL — ABNORMAL HIGH (ref 70–99)
Glucose-Capillary: 122 mg/dL — ABNORMAL HIGH (ref 70–99)

## 2014-08-23 LAB — HEMOGLOBIN AND HEMATOCRIT, BLOOD
HCT: 19.9 % — ABNORMAL LOW (ref 36.0–46.0)
Hemoglobin: 6.5 g/dL — CL (ref 12.0–15.0)

## 2014-08-23 LAB — PREPARE RBC (CROSSMATCH)

## 2014-08-23 IMAGING — CT CT ABD-PELV W/O CM
2 of 4 series · 17 of 46 positions shown, 19 images · non-contrast
Comparison: [DATE]

CLINICAL DATA: Postop from right renal stone extraction. Bleeding
around nephrostomy tube. Evaluate for retroperitoneal hemorrhage.

EXAM:
CT ABDOMEN AND PELVIS WITHOUT CONTRAST
TECHNIQUE: Multidetector CT imaging of the abdomen and pelvis was performed
following the standard protocol without IV contrast.

[Series 2: stone under (id) w prev · axial · 0.80mm/px · z∈[-500,-60]mm · 14 of 102 slices shown, 16 images]
[im 7/102  soft-tissue]
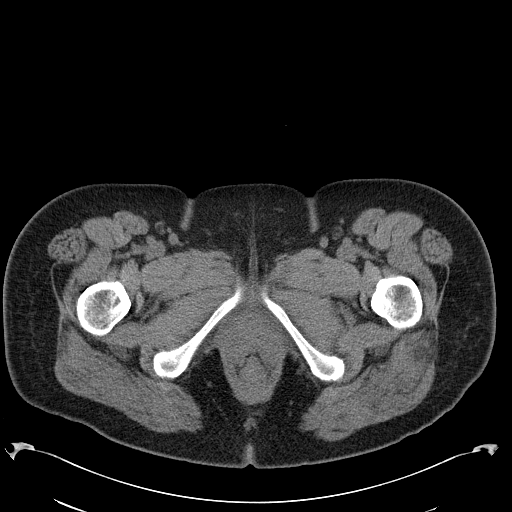
[im 7/102  bone]
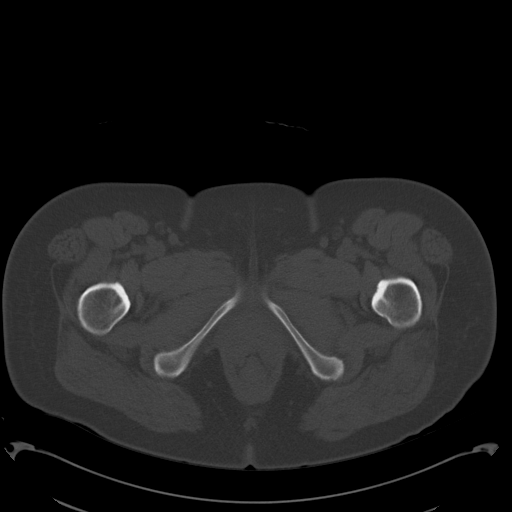
[im 14/102  soft-tissue]
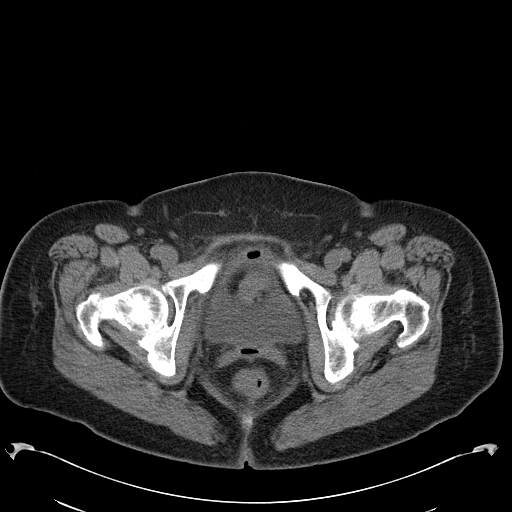
[im 21/102  soft-tissue]
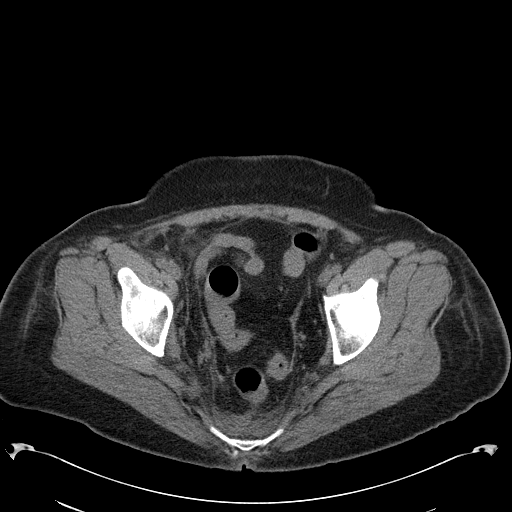
[im 27/102  soft-tissue]
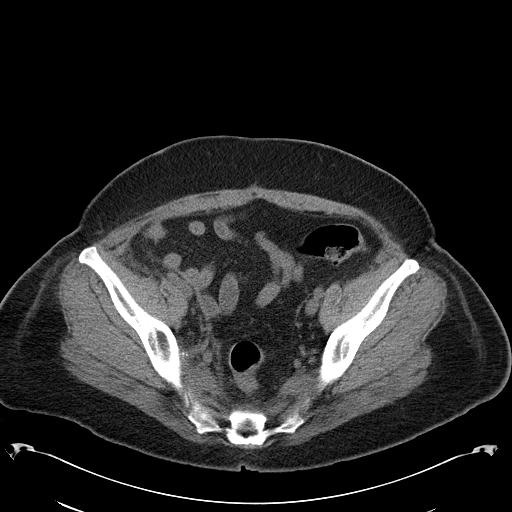
[im 34/102  soft-tissue]
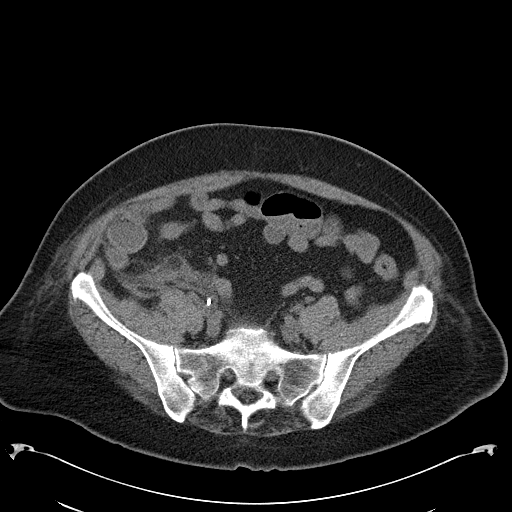
[im 41/102  soft-tissue]
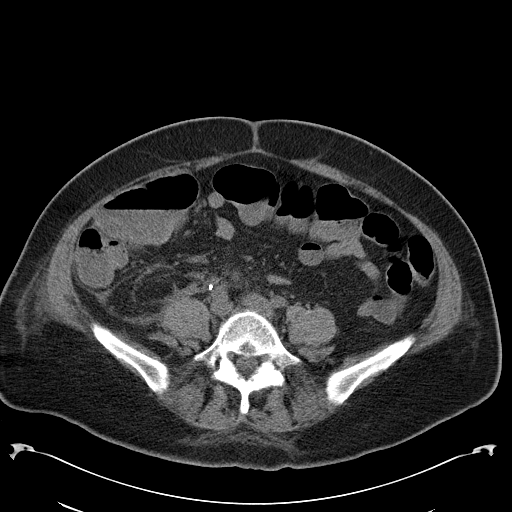
[im 48/102  soft-tissue]
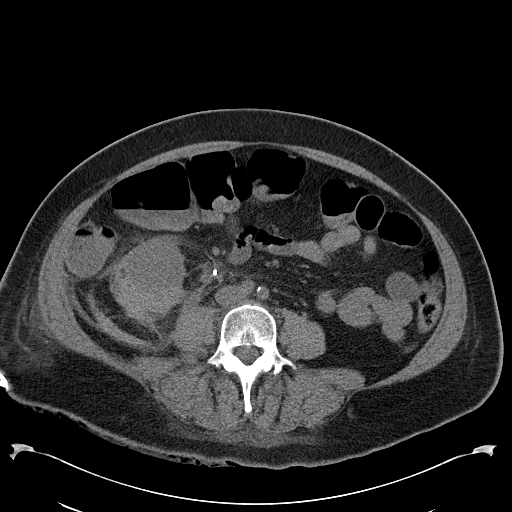
[im 54/102  soft-tissue]
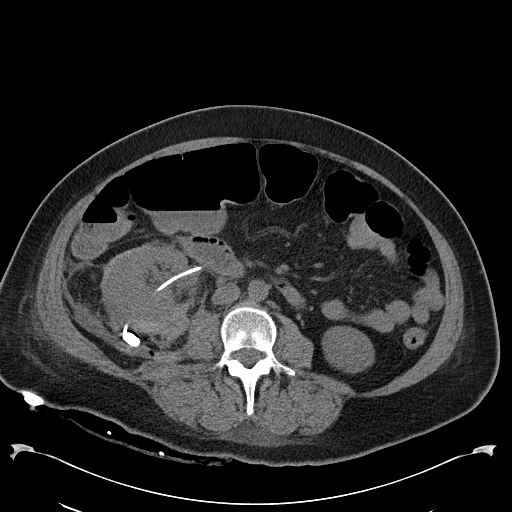
[im 61/102  soft-tissue]
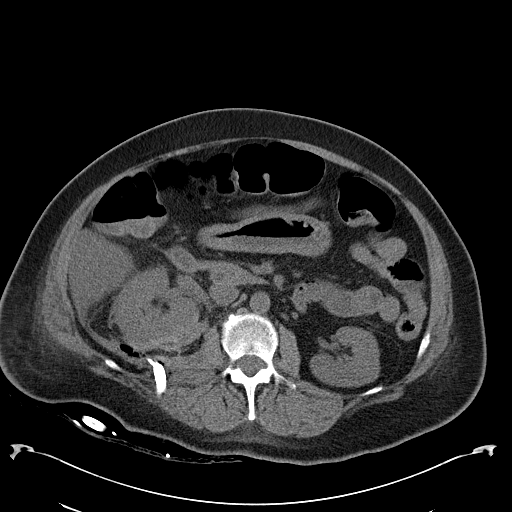
[im 61/102  bone]
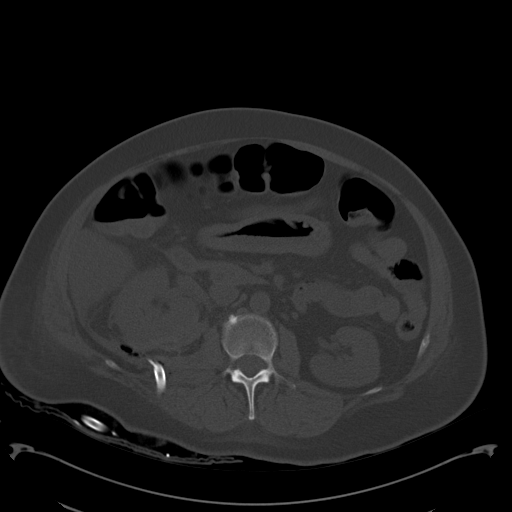
[im 68/102  soft-tissue]
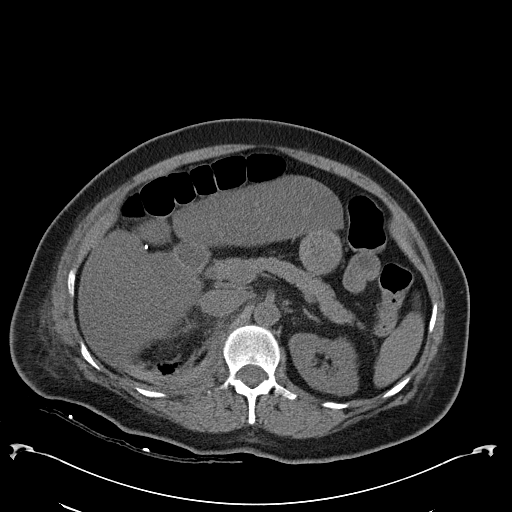
[im 75/102  soft-tissue]
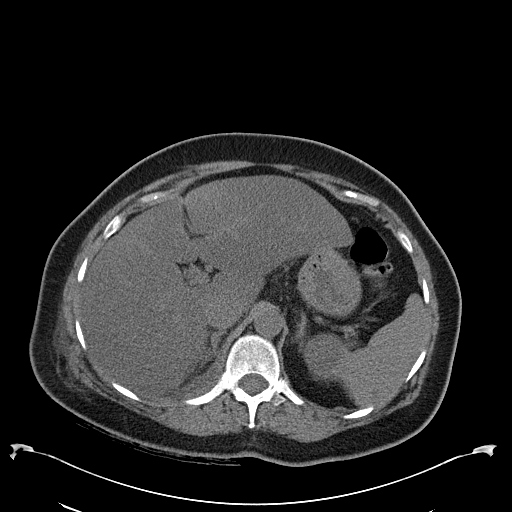
[im 81/102  soft-tissue]
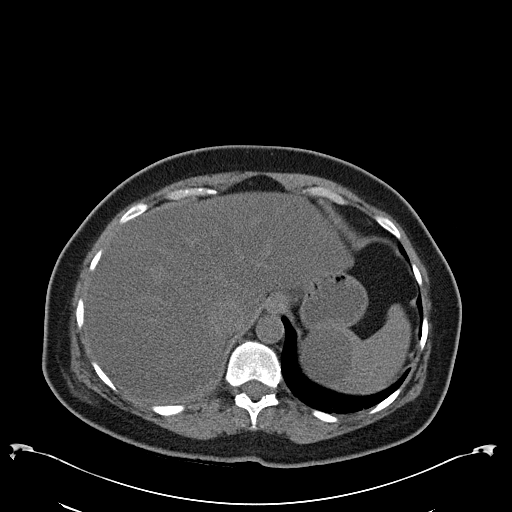
[im 88/102  soft-tissue]
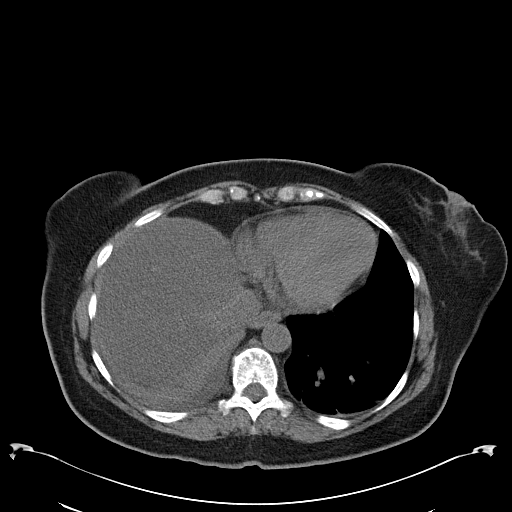
[im 95/102  soft-tissue]
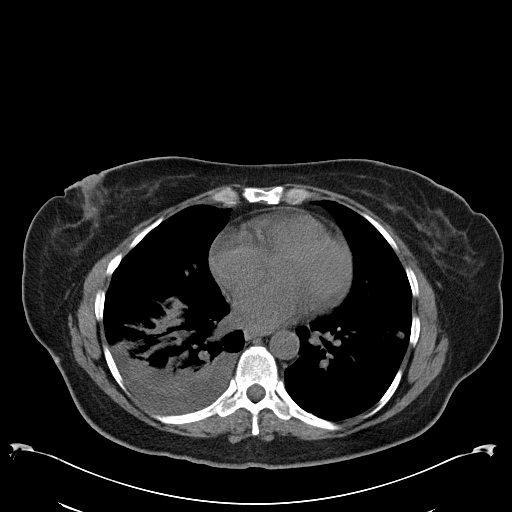

[Series 602: <mpr thick range> · coronal · 0.99mm/px · 3 of 155 slices shown]
[im 52/155  soft-tissue]
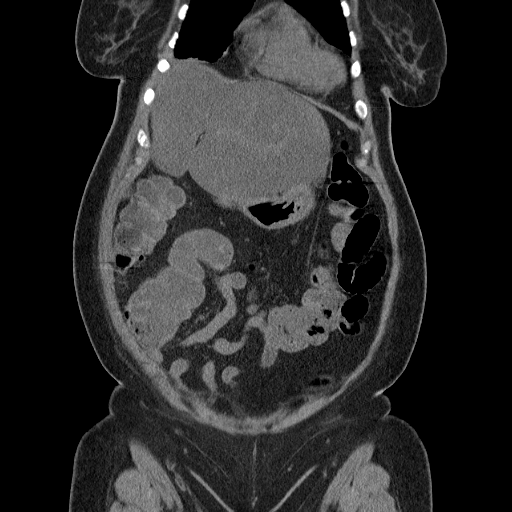
[im 69/155  soft-tissue]
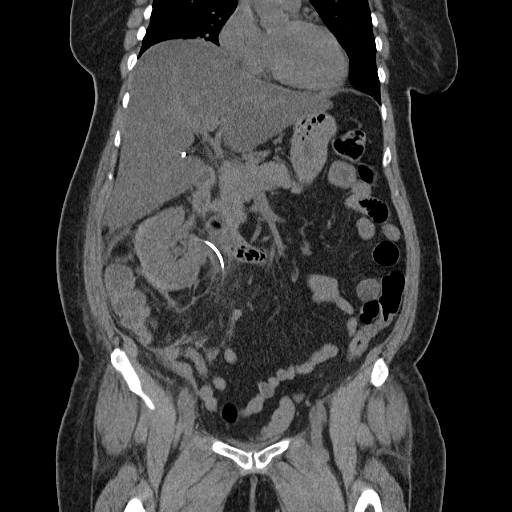
[im 86/155  soft-tissue]
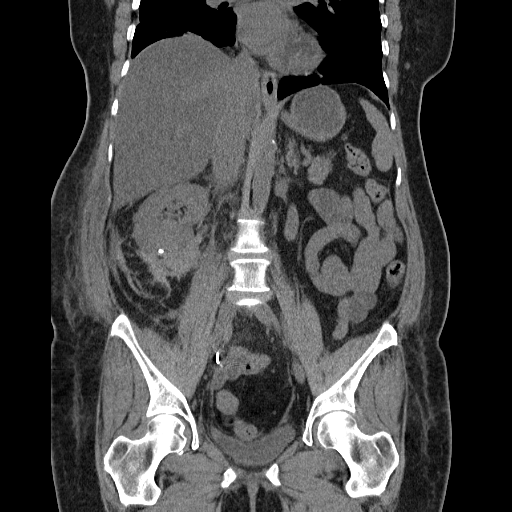

[17 of 46 positions shown; findings below may reference images not displayed]

FINDINGS: Lower chest: New small right pleural effusion and bibasilar
atelectasis, right side greater than left.

Hepatobiliary: No mass visualized on this unenhanced exam. Diffuse
hepatic steatosis again demonstrated as well as prior
cholecystectomy.

Pancreas: No mass or inflammatory process visualized on this
unenhanced exam.

Spleen: No evidence of splenomegaly. Stable cystic lesion with mural
calcification in the medial aspect of the spleen.

Adrenal Glands:  No masses identified.

Kidneys/Urinary tract: A right percutaneous nephrostomy tube is now
seen in place with a right ureteral stent extending into the pelvic
portion of the right ureter. Mild right perinephric hematoma is
seen, without extension into other areas of retroperitoneum.
Previously seen calculus in the right renal pelvis is no longer
visualized. Left kidney is unremarkable in appearance on this
noncontrast exam.

Stomach/Bowel/Peritoneum:  Unremarkable.

Vascular/Lymphatic: No pathologically enlarged lymph nodes
identified. No other significant abnormality identified.

Reproductive: No mass or other significant abnormality noted. Prior
hysterectomy again noted.

Other:  None.

Musculoskeletal:  No suspicious bone lesions identified.
IMPRESSION: Right percutaneous nephrostomy and internal ureteral stent extending
into the pelvic portion of the right ureter.

Mild right perinephric hematoma.

New small right pleural effusion and bibasilar atelectasis.

Stable hepatic steatosis and benign appearing cystic lesion in the
spleen.

## 2014-08-23 MED ORDER — SODIUM CHLORIDE 0.9 % IV SOLN: Freq: Once | INTRAVENOUS | Status: DC

## 2014-08-23 MED ORDER — ONDANSETRON HCL 4 MG PO TABS
4.0000 mg | ORAL_TABLET | Freq: Four times a day (QID) | ORAL | Status: DC | PRN
  Administered 2014-08-23 – 2014-08-24 (×2): 4 mg via ORAL
  Filled 2014-08-23 (×2): qty 1

## 2014-08-23 MED ORDER — BOOST PLUS PO LIQD
237.0000 mL | ORAL | Status: DC
  Administered 2014-08-23: 237 mL via ORAL
  Filled 2014-08-23 (×2): qty 237

## 2014-08-23 NOTE — Progress Notes (Signed)
3 Days Post-Op Subjective: Patient reports she is not having any new right flank pain. The nurse had noted that there was some increased bloody discharge on the dressing. The discomfort that she has in her right flank is worse when she is up moving. That is not changed from yesterday. She also reports that she does not have any dizziness but does indicate that she sits up and dangles her feet over the edge of the bed a little bit before she tries to stand.  Objective: Vital signs in last 24 hours: Temp:  [99.2 F (37.3 C)-102.7 F (39.3 C)] 99.2 F (37.3 C) (03/06 0309) Pulse Rate:  [100-118] 106 (03/06 0309) Resp:  [20] 20 (03/06 0309) BP: (104-114)/(45-50) 111/50 mmHg (03/06 0309) SpO2:  [91 %-100 %] 100 % (03/06 0309)  Intake/Output from previous day: 03/05 0701 - 03/06 0700 In: 1670 [P.O.:440; I.V.:1190] Out: 1025 [Urine:1025] Intake/Output this shift: Total I/O In: 10 [Other:10] Out: -    ROS: Her 13 point ROS is negative other than as noted above.  Physical Exam:  Her flank dressing is intact but there is some blood on the dressing that was not present yesterday Her nephrostomy tube is draining bloody fluid.  Lab Results:  Recent Labs  08/21/14 0404 08/22/14 0350 08/23/14 0443  HGB 8.3* 7.5* 6.5*  HCT 26.1* 22.7* 19.9*   BMET  Recent Labs  08/21/14 0404  NA 137  K 3.8  CL 108  CO2 23  GLUCOSE 163*  BUN 10  CREATININE 0.97  CALCIUM 8.4   No results for input(s): LABPT, INR in the last 72 hours. No results for input(s): LABURIN in the last 72 hours. Results for orders placed or performed in visit on 05/15/12  Wound culture     Status: None   Collection Time: 05/15/12  5:08 PM  Result Value Ref Range Status   Gram Stain Moderate  Final   Gram Stain WBC present-predominately PMN  Final   Gram Stain Rare Squamous Epithelial Cells Present  Final   Gram Stain   Final    Moderate GRAM POSITIVE COCCI IN PAIRS In Clusters In Chains   Organism ID, Bacteria  Multiple Organisms Present,None Predominant  Final    Comment: No Staphylococcus aureus isolated NOHBA   CT scan images were independently reviewed as below Studies/Results: No results found.  Assessment/Plan: Although clinically she seems to be doing fairly well her hemoglobin has decreased further. She therefore would be considered to be having some worsening of her condition although she remains free of orthostasis, hypotension or tachycardia. With a hemoglobin now down to 6.5 I have discussed with her proceeding with blood transfusion. We also discussed obtaining a CT scan and if the bleeding does not cease consideration of interventional radiographic evaluation with arteriogram and possible embolization of identified bleeding vessels. At this point however I have recommended a transfusion and obtained a CT scan. The CT scan has revealed her nephrostomy tube in good position, her nephroureteral stent in good position and a small pleural effusion but very little blood around the kidney. I suspect her bleeding should cease with conservative therapy. She will be transfused 2 units of red blood cells and I will check a follow-up hemoglobin this evening and again in the morning.  She did have a temperature spike but she has a small pleural effusion and I suspect that may be where this is coming from. If this persists or she has a significant elevation in her white blood cell count  further evaluation with consideration of blood cultures and a switch of her antibiotics from Keflex to broad-spectrum coverage. I will check a CBC this afternoon to follow-up on her posttransfusion hemoglobin level as well as white blood cell count.  Transfused 2 units packed RBCs.  I will check a CBC this afternoon.  Recheck H&H in a.m.   LOS: 1 day   Joaovictor Krone C 08/23/2014, 9:04 AM

## 2014-08-23 NOTE — Progress Notes (Signed)
INITIAL NUTRITION ASSESSMENT  DOCUMENTATION CODES Per approved criteria  -Not Applicable   INTERVENTION: Provide Boost Plus daily, each provides 360 kcal and 14g of protein Provide afternoon snack daily Adjusted diet order to meal order with assist d/t pt following a Gluten-free diet Recommend changing diet order to include gluten-free restrictions RD to continue to monitor  NUTRITION DIAGNOSIS: Inadequate oral intake related to poor appetite as evidenced by pt refusing meals at this time.   Goal: Pt to meet >/= 90% of their estimated nutrition needs   Monitor:  PO and supplemental intake, weight, labs, I/O's  Reason for Assessment: Pt identified as at nutrition risk on the Malnutrition Screen Tool  Admitting Dx: renal stone  ASSESSMENT: 59 y.o. female with history of symptomatic 14 mm right renal pelvic stone who presents today for right percutaneous nephrostomy/nephroureteral catheter placement prior to planned nephrolithotomy. S/p 3/3 (s): RIGHT PERCUTANEOUS NEPHROLITHOTOMY  Pt and husband in room at bedside. Pt reports very poor appetite. Pt states at first her poor PO intake was d/t nausea but now she has no appetite and is refusing meals. Encouraged pt to try and sip on her supplements and eat small frequent meals. Pt reports not liking the taste of the Resource Breeze supplements. Pt is willing to try Boost drinks and would like an afternoon snack. RD to order.  Pt's weight is stable.  Nutrition focused physical exam shows no sign of depletion of muscle mass or body fat.  Labs reviewed:  Glucose 163  Height: Ht Readings from Last 1 Encounters:  08/20/14 5\' 4"  (1.626 m)    Weight: Wt Readings from Last 1 Encounters:  08/20/14 168 lb (76.204 kg)    Ideal Body Weight: 120 lb  % Ideal Body Weight: 140%  Wt Readings from Last 10 Encounters:  08/20/14 168 lb (76.204 kg)  08/16/14 168 lb (76.204 kg)  08/10/14 171 lb 12 oz (77.905 kg)  08/06/14 170 lb 12.8  oz (77.474 kg)  09/25/13 172 lb 6.4 oz (78.2 kg)  09/18/13 170 lb 12 oz (77.452 kg)  08/18/13 172 lb 8 oz (78.245 kg)  08/07/13 170 lb (77.111 kg)  03/17/13 170 lb 4 oz (77.225 kg)  09/25/12 164 lb 8 oz (74.617 kg)    Usual Body Weight: 168 lb -per pt  % Usual Body Weight: 100%  BMI:  Body mass index is 28.82 kg/(m^2).  Estimated Nutritional Needs: Kcal: 1900-2100 Protein: 90-100g Fluid: 1.9L/day  Skin: surgical incision  Diet Order: Diet Carb Modified  EDUCATION NEEDS: -No education needs identified at this time   Intake/Output Summary (Last 24 hours) at 08/23/14 1147 Last data filed at 08/23/14 1105  Gross per 24 hour  Intake   1680 ml  Output   1025 ml  Net    655 ml    Last BM: 3/2  Labs:   Recent Labs Lab 08/16/14 1455 08/21/14 0404  NA 137 137  K 3.7 3.8  CL 109 108  CO2 25 23  BUN 9 10  CREATININE 0.82 0.97  CALCIUM 8.9 8.4  GLUCOSE 120* 163*    CBG (last 3)   Recent Labs  08/22/14 1650 08/22/14 2104 08/23/14 0741  GLUCAP 119* 165* 125*    Scheduled Meds: . sodium chloride   Intravenous Once  . cephALEXin  500 mg Oral BID  . docusate sodium  100 mg Oral BID  . feeding supplement (RESOURCE BREEZE)  1 Container Oral TID BM  . fenofibrate  160 mg Oral Daily  .  ferrous sulfate  325 mg Oral TID WC  . insulin aspart  0-15 Units Subcutaneous TID WC  . levothyroxine  50 mcg Oral QAC breakfast  . pantoprazole  40 mg Oral Daily  . pravastatin  40 mg Oral QODAY  . sertraline  50 mg Oral Daily  . topiramate  50 mg Oral Daily    Continuous Infusions: . dextrose 5 % and 0.45 % NaCl with KCl 20 mEq/L 100 mL/hr at 08/23/14 0441    Past Medical History  Diagnosis Date  . Asthma   . Depression   . Migraine headache   . IBS (irritable bowel syndrome)   . Hypothyroidism   . Myocarditis 01/21/2010    hospitalized- [facility]  . Hemorrhoids   . HLD (hyperlipidemia)   . History of pneumonia 1969  . NSTEMI (non-ST elevated myocardial infarction),  01/2010 [facility] 09/19/2013    Secondary to viral myocarditis   . Diabetes mellitus     type II off medication    Past Surgical History  Procedure Laterality Date  . Appendectomy  1974  . Cholecystectomy  2002  . Total abdominal hysterectomy w/ bilateral salpingoophorectomy  2000    no ovaries, uterus, or cervix.  . Cardiac catheterization  8/11    no significant -[facility]- Dr Rockey Situ,  . Breast biopsy  1988  . Nephrolithotomy Right 08/20/2014    Procedure: RIGHT PERCUTANEOUS NEPHROLITHOTOMY ;  Surgeon: Malka So, MD;  Location: [facility];  Service: Urology;  Laterality: Right;    Clayton Bibles, MS, RD, LDN Pager: [phone] After Hours Pager: [phone]

## 2014-08-23 NOTE — Progress Notes (Signed)
CRITICAL VALUE ALERT  Critical value received:  Hgb 6.5  Date of notification:  08/23/14  Time of notification:  0610  Critical value read back:Yes.    Nurse who received alert:  Philemon Kingdom RN   MD notified (1st page):  Dr Karsten Ro  Time of first page:  0615  MD notified (2nd page):  Time of second page:  Responding MD:  Dr Karsten Ro  Time MD responded:  [phone]

## 2014-08-24 ENCOUNTER — Inpatient Hospital Stay (HOSPITAL_COMMUNITY): Payer: PRIVATE HEALTH INSURANCE

## 2014-08-24 LAB — TYPE AND SCREEN
ABO/RH(D): B POS
Antibody Screen: NEGATIVE
Unit division: 0
Unit division: 0

## 2014-08-24 LAB — GLUCOSE, CAPILLARY
Glucose-Capillary: 114 mg/dL — ABNORMAL HIGH (ref 70–99)
Glucose-Capillary: 126 mg/dL — ABNORMAL HIGH (ref 70–99)
Glucose-Capillary: 114 mg/dL — ABNORMAL HIGH (ref 70–99)
Glucose-Capillary: 132 mg/dL — ABNORMAL HIGH (ref 70–99)

## 2014-08-24 LAB — HEMOGLOBIN AND HEMATOCRIT, BLOOD
HCT: 25.4 % — ABNORMAL LOW (ref 36.0–46.0)
Hemoglobin: 8.4 g/dL — ABNORMAL LOW (ref 12.0–15.0)

## 2014-08-24 MED ORDER — MENTHOL 3 MG MT LOZG
1.0000 | LOZENGE | OROMUCOSAL | Status: DC | PRN
Start: 2014-08-24 — End: 2014-08-25
  Filled 2014-08-24: qty 9

## 2014-08-24 MED ORDER — IOHEXOL 300 MG/ML  SOLN
10.0000 mL | Freq: Once | INTRAMUSCULAR | Status: AC | PRN
  Administered 2014-08-24: 1 mL

## 2014-08-24 MED ORDER — PHENOL 1.4 % MT LIQD
1.0000 | OROMUCOSAL | Status: DC | PRN
  Filled 2014-08-24: qty 177

## 2014-08-24 MED ORDER — LIDOCAINE HCL 1 % IJ SOLN
INTRAMUSCULAR | Status: AC
  Filled 2014-08-24: qty 20

## 2014-08-24 MED ORDER — CEPHALEXIN 500 MG PO CAPS
500.0000 mg | ORAL_CAPSULE | Freq: Four times a day (QID) | ORAL | Status: DC
  Administered 2014-08-24 – 2014-08-25 (×4): 500 mg via ORAL
  Filled 2014-08-24 (×6): qty 1

## 2014-08-24 MED ORDER — CLOTRIMAZOLE 10 MG MT TROC
10.0000 mg | Freq: Every day | OROMUCOSAL | Status: DC
  Administered 2014-08-24 – 2014-08-25 (×4): 10 mg via ORAL
  Filled 2014-08-24 (×8): qty 1

## 2014-08-24 NOTE — Procedures (Signed)
Successful removal of Red Robin catheter. Successful conversion of right sided Kumpe to a 10 Fr PCN with end coiled and locked in the renal pelvis.   No immediate complications.

## 2014-08-24 NOTE — Progress Notes (Signed)
Patient ID: Brandi Mahoney, female   DOB: Jun 21, 1955, 59 y.o.   MRN: [id number] 4 Days Post-Op  Subjective: Brandi Mahoney is POD #4 from a right PCNL complicated by post op hemorrhage with ABL anemia with a hgb decline to 6.5 which required a 2 unit PRBC transfusion.  Her hgb today is stable over 24hrs at 8.4.  A CT over the weekend showed the 25fr nephrostomy tube in the perinephric space with some swelling of the kidney but minimal perinephric blood/fluid.  She has a right pleural effusion.   She reports a sore throat today and some residual right flank pain but is ambulating and tolerating a diet.  Her NT is draining well and the urine is clearing but still pink.  She has had a variable fever over the last 2 days but she has been afebrile since her last elevation yesterday afternoon.  She remains on keflex.  ROS:  Review of Systems  Constitutional: Negative for fever and chills.  HENT: Positive for sore throat.   Respiratory: Negative for cough and shortness of breath.   Cardiovascular: Negative for chest pain.  Gastrointestinal: Positive for abdominal pain (RUQ). Negative for nausea.  Genitourinary: Negative.     Anti-infectives: Anti-infectives    Start     Dose/Rate Route Frequency Ordered Stop   08/20/14 1200  cephALEXin (KEFLEX) capsule 500 mg     500 mg Oral 2 times daily 08/20/14 1104     08/20/14 0537  ciprofloxacin (CIPRO) IVPB 400 mg     400 mg 200 mL/hr over 60 Minutes Intravenous 60 min pre-op 08/20/14 0537 08/20/14 0734      Current Facility-Administered Medications  Medication Dose Route Frequency Provider Last Rate Last Dose  . 0.9 %  sodium chloride infusion   Intravenous Once [name], MD      . acetaminophen (TYLENOL) tablet 650 mg  650 mg Oral Q4H PRN Malka So, MD   650 mg at 08/23/14 1456  . bisacodyl (DULCOLAX) suppository 10 mg  10 mg Rectal Daily PRN Malka So, MD      . cephALEXin [facility]) capsule 500 mg  500 mg Oral BID Malka So, MD   500 mg at  08/23/14 2050  . clotrimazole (MYCELEX) troche 10 mg  10 mg Oral 5 X Daily Malka So, MD      . dextrose 5 % and 0.45 % NaCl with KCl 20 mEq/L infusion   Intravenous Continuous Malka So, MD 100 mL/hr at 08/23/14 0441    . docusate sodium (COLACE) capsule 100 mg  100 mg Oral BID Malka So, MD   100 mg at 08/23/14 2050  . feeding supplement (RESOURCE BREEZE) (RESOURCE BREEZE) liquid 1 Container  1 Container Oral TID BM Malka So, MD   1 Container at 08/23/14 1400  . fenofibrate tablet 160 mg  160 mg Oral Daily Malka So, MD   160 mg at 08/23/14 0946  . ferrous sulfate tablet 325 mg  325 mg Oral TID WC Malka So, MD   325 mg at 08/24/14 0734  . HYDROmorphone (DILAUDID) injection 1-2 mg  1-2 mg Intravenous Q2H PRN Malka So, MD   2 mg at 08/23/14 1743  . insulin aspart (novoLOG) injection 0-15 Units  0-15 Units Subcutaneous TID WC Malka So, MD   2 Units at 08/23/14 1650  . lactose free nutrition (BOOST PLUS) liquid 237 mL  237 mL Oral Q24H Clayton Bibles, RD  237 mL at 08/23/14 2050  . levothyroxine (SYNTHROID, LEVOTHROID) tablet 50 mcg  50 mcg Oral QAC breakfast Malka So, MD   50 mcg at 08/24/14 [phone]  . menthol-cetylpyridinium (CEPACOL) lozenge 3 mg  1 lozenge Oral PRN Malka So, MD      . nitroGLYCERIN (NITROSTAT) SL tablet 0.4 mg  0.4 mg Sublingual Q5 min PRN Malka So, MD      . ondansetron [facility]) injection 4 mg  4 mg Intravenous Q4H PRN Malka So, MD   4 mg at 08/23/14 1743  . ondansetron (ZOFRAN) tablet 4 mg  4 mg Oral QID PRN Malka So, MD   4 mg at 08/23/14 1243  . oxybutynin (DITROPAN) tablet 5 mg  5 mg Oral Q8H PRN Malka So, MD      . oxyCODONE (Oxy IR/ROXICODONE) immediate release tablet 5 mg  5 mg Oral Q4H PRN Malka So, MD   5 mg at 08/23/14 2050  . pantoprazole (PROTONIX) EC tablet 40 mg  40 mg Oral Daily Malka So, MD   40 mg at 08/23/14 0946  . phenol (CHLORASEPTIC) mouth spray 1 spray  1 spray Mouth/Throat PRN Malka So, MD       . pravastatin (PRAVACHOL) tablet 40 mg  40 mg Oral QODAY Malka So, MD   40 mg at 08/22/14 4709  . sertraline (ZOLOFT) tablet 50 mg  50 mg Oral Daily Malka So, MD   50 mg at 08/23/14 0946  . simethicone (MYLICON) chewable tablet 80 mg  80 mg Oral QID PRN Malka So, MD   80 mg at 08/23/14 0946  . topiramate (TOPAMAX) tablet 50 mg  50 mg Oral Daily Malka So, MD   50 mg at 08/23/14 0946  . zolpidem (AMBIEN) tablet 5 mg  5 mg Oral QHS PRN Malka So, MD   5 mg at 08/20/14 2125     Objective: Vital signs in last 24 hours: Temp:  [97.5 F (36.4 C)-101.5 F (38.6 C)] 98 F (36.7 C) (03/07 0506) Pulse Rate:  [81-103] 92 (03/07 0506) Resp:  [12-18] 18 (03/07 0506) BP: (99-127)/(52-67) 127/52 mmHg (03/07 0506) SpO2:  [97 %-99 %] 97 % (03/07 0506)  Intake/Output from previous day: 03/06 0701 - 03/07 0700 In: 5180 [P.O.:600; I.V.:3860; Blood:670] Out: 850 [Urine:850] Intake/Output this shift:     Physical Exam  Constitutional: She is oriented to person, place, and time and well-developed, well-nourished, and in no distress.  Cardiovascular: Normal rate and regular rhythm.   Pulmonary/Chest: Effort normal and breath sounds normal. No respiratory distress.  Abdominal: Soft. Bowel sounds are normal. She exhibits distension. There is tenderness (RUQ with some firmness and guarding. ).  Musculoskeletal: Normal range of motion. She exhibits no edema or tenderness.  Neurological: She is alert and oriented to person, place, and time.  Skin: Skin is warm and dry. No erythema.  Psychiatric: Mood and affect normal.    Lab Results:   Recent Labs  08/23/14 2000 08/24/14 0536  WBC 12.1*  --   HGB 8.5* 8.4*  HCT 26.0* 25.4*  PLT 256  --    BMET No results for input(s): NA, K, CL, CO2, GLUCOSE, BUN, CREATININE, CALCIUM in the last 72 hours. PT/INR No results for input(s): LABPROT, INR in the last 72 hours. ABG No results for input(s): PHART, HCO3 in the last 72  hours.  Invalid input(s): PCO2, PO2  Studies/Results: Ct Abdomen Pelvis Wo Contrast  08/23/2014   CLINICAL DATA:  Postop from right renal stone extraction. Bleeding around nephrostomy tube. Evaluate for retroperitoneal hemorrhage.  EXAM: CT ABDOMEN AND PELVIS WITHOUT CONTRAST  TECHNIQUE: Multidetector CT imaging of the abdomen and pelvis was performed following the standard protocol without IV contrast.  COMPARISON:  08/07/14  FINDINGS: Lower chest: New small right pleural effusion and bibasilar atelectasis, right side greater than left.  Hepatobiliary: No mass visualized on this unenhanced exam. Diffuse hepatic steatosis again demonstrated as well as prior cholecystectomy.  Pancreas: No mass or inflammatory process visualized on this unenhanced exam.  Spleen: No evidence of splenomegaly. Stable cystic lesion with mural calcification in the medial aspect of the spleen.  Adrenal Glands:  No masses identified.  Kidneys/Urinary tract: A right percutaneous nephrostomy tube is now seen in place with a right ureteral stent extending into the pelvic portion of the right ureter. Mild right perinephric hematoma is seen, without extension into other areas of retroperitoneum. Previously seen calculus in the right renal pelvis is no longer visualized. Left kidney is unremarkable in appearance on this noncontrast exam.  Stomach/Bowel/Peritoneum:  Unremarkable.  Vascular/Lymphatic: No pathologically enlarged lymph nodes identified. No other significant abnormality identified.  Reproductive: No mass or other significant abnormality noted. Prior hysterectomy again noted.  Other:  None.  Musculoskeletal:  No suspicious bone lesions identified.  IMPRESSION: Right percutaneous nephrostomy and internal ureteral stent extending into the pelvic portion of the right ureter.  Mild right perinephric hematoma.  New small right pleural effusion and bibasilar atelectasis.  Stable hepatic steatosis and benign appearing cystic lesion in the  spleen.   Electronically Signed   By: Earle Gell M.D.   On: 08/23/2014 09:15   I have reviewed her CT films and recent labs.   Assessment: s/p Procedure(s): RIGHT PERCUTANEOUS NEPHROLITHOTOMY  With postop bleeding requiring transfusion.  Postop fever that appears to have resolved.  She is on keflex.   The nephrostomy tube is dislodged but draining.  Plan: I am going to have IR replaced her 50fr tube and 70fr tube with a single 66fr loop nephrostomy tube.  I will consider discharge in the morning if she remains afebrile.   If she spikes again, she will need to be cultured.       LOS: 2 days    Malka So 08/24/2014

## 2014-08-25 DIAGNOSIS — D62 Acute posthemorrhagic anemia: Secondary | ICD-10-CM | POA: Diagnosis not present

## 2014-08-25 LAB — GLUCOSE, CAPILLARY: GLUCOSE-CAPILLARY: 143 mg/dL — AB (ref 70–99)

## 2014-08-25 MED ORDER — DOCUSATE SODIUM 100 MG PO CAPS: 100.0000 mg | ORAL_CAPSULE | Freq: Two times a day (BID) | ORAL | Status: DC

## 2014-08-25 MED ORDER — ONDANSETRON HCL 4 MG PO TABS: 4.0000 mg | ORAL_TABLET | Freq: Four times a day (QID) | ORAL | Status: DC | PRN

## 2014-08-25 MED ORDER — OXYCODONE HCL 5 MG PO TABS: 5.0000 mg | ORAL_TABLET | ORAL | Status: DC | PRN

## 2014-08-25 MED ORDER — FERROUS SULFATE 325 (65 FE) MG PO TABS: 325.0000 mg | ORAL_TABLET | Freq: Three times a day (TID) | ORAL | Status: DC

## 2014-08-25 MED ORDER — CEPHALEXIN 500 MG PO CAPS: 500.0000 mg | ORAL_CAPSULE | Freq: Four times a day (QID) | ORAL | Status: DC

## 2014-08-25 NOTE — Discharge Summary (Signed)
Physician Discharge Summary  Patient ID: Brandi Mahoney MRN: [id number] DOB/AGE: 1956-04-07 59 y.o.  Admit date: 08/20/2014 Discharge date: 08/25/2014  Admission Diagnoses:  Renal stone  Discharge Diagnoses:  Principal Problem:   Renal stone Active Problems:   Postoperative anemia due to acute blood loss   Past Medical History  Diagnosis Date  . Asthma   . Depression   . Migraine headache   . IBS (irritable bowel syndrome)   . Hypothyroidism   . Myocarditis 01/21/2010    hospitalized- [facility]  . Hemorrhoids   . HLD (hyperlipidemia)   . History of pneumonia 1969  . NSTEMI (non-ST elevated myocardial infarction), 01/2010 [facility] 09/19/2013    Secondary to viral myocarditis   . Diabetes mellitus     type II off medication    Surgeries: Procedure(s): RIGHT PERCUTANEOUS NEPHROLITHOTOMY  on 08/20/2014   Consultants (if any):    Discharged Condition: Improved  Hospital Course: Brandi Mahoney is an 59 y.o. female who was admitted 08/20/2014 with a diagnosis of 82mm right  Renal stone and went to the operating room on 08/20/2014 and underwent the above named procedures.  She had bleeding at the beginning of the procedure that stopped with tamponade and the stone was removed intact.   In the PACU she had about a 109ml hemorrhage that stopped with pressure and a position change.   Her Hgb drifted down to 6.5 and she was transfused with 2 units PRBC's.   She had a post op fever to 102 and was kept on keflex po.  Her hgb stabilized at 8.4 24hr post transfusion.   Her VS remained stable and her fever resolved.  A CT over the weekend showed the 64fr tube was in the perinephric space and I had it exchanged yesterday for a 76fr loop nephrostomy.   The drainage is almost clear today but she is having some oozing around the tube.  She is felt to be ready for discharge home with the tube and will f/u on Thursday to have it removed.    She was given perioperative antibiotics:  Anti-infectives    Start      Dose/Rate Route Frequency Ordered Stop   08/25/14 0000  cephALEXin (KEFLEX) 500 MG capsule     500 mg Oral 4 times daily 08/25/14 0751     08/24/14 1200  cephALEXin (KEFLEX) capsule 500 mg     500 mg Oral 4 times per day 08/24/14 0745     08/20/14 1200  cephALEXin (KEFLEX) capsule 500 mg  Status:  Discontinued     500 mg Oral 2 times daily 08/20/14 1104 08/24/14 0745   08/20/14 0537  ciprofloxacin (CIPRO) IVPB 400 mg     400 mg 200 mL/hr over 60 Minutes Intravenous 60 min pre-op 08/20/14 0537 08/20/14 0734    .  She was given sequential compression devices for DVT prophylaxis.  She is stone free at discharge but her post op course was complicated by the acute blood loss anemia and a fever. .    Recent vital signs:  Filed Vitals:   08/25/14 0609  BP: 119/53  Pulse: 83  Temp: 98.4 F (36.9 C)  Resp: 20    Recent laboratory studies:  Lab Results  Component Value Date   HGB 8.4* 08/24/2014   HGB 8.5* 08/23/2014   HGB 6.5* 08/23/2014   Lab Results  Component Value Date   WBC 12.1* 08/23/2014   PLT 256 08/23/2014   Lab Results  Component Value Date  INR 1.01 08/19/2014   Lab Results  Component Value Date   NA 137 08/21/2014   K 3.8 08/21/2014   CL 108 08/21/2014   CO2 23 08/21/2014   BUN 10 08/21/2014   CREATININE 0.97 08/21/2014   GLUCOSE 163* 08/21/2014    Discharge Medications:     Medication List    TAKE these medications        ACCU-CHEK AVIVA test strip  Generic drug:  glucose blood  Use as instructed     aspirin EC 81 MG tablet  Take 81 mg by mouth daily.     cephALEXin 500 MG capsule  Commonly known as:  KEFLEX  Take 500 mg by mouth 2 (two) times daily. 10 day course started 08/12/14     cephALEXin 500 MG capsule  Commonly known as:  KEFLEX  Take 1 capsule (500 mg total) by mouth 4 (four) times daily.     docusate sodium 100 MG capsule  Commonly known as:  COLACE  Take 1 capsule (100 mg total) by mouth 2 (two) times daily.      fenofibrate 145 MG tablet  Commonly known as:  TRICOR  Take 1 tablet (145 mg total) by mouth daily.     ferrous sulfate 325 (65 FE) MG tablet  Take 1 tablet (325 mg total) by mouth 3 (three) times daily with meals.     levothyroxine 50 MCG tablet  Commonly known as:  SYNTHROID, LEVOTHROID  Take 1 tablet (50 mcg total) by mouth daily.     nitroGLYCERIN 0.4 MG SL tablet  Commonly known as:  NITROSTAT  1 tablet under tongue at onset of chest pain; you may repeat every 5 minutes for up to 3 doses.     ondansetron 4 MG tablet  Commonly known as:  ZOFRAN  Take 1 tablet by mouth every 4 (four) hours as needed. nausea     ondansetron 4 MG tablet  Commonly known as:  ZOFRAN  Take 1 tablet (4 mg total) by mouth 4 (four) times daily as needed for nausea or vomiting.     oxyCODONE 5 MG immediate release tablet  Commonly known as:  Oxy IR/ROXICODONE  Take 1 tablet (5 mg total) by mouth every 4 (four) hours as needed for moderate pain.     oxyCODONE-acetaminophen 5-325 MG per tablet  Commonly known as:  PERCOCET/ROXICET  Take 1-2 tablets by mouth every 4 (four) hours as needed for severe pain (pain).     pantoprazole 40 MG tablet  Commonly known as:  PROTONIX  Take 1 tablet (40 mg total) by mouth daily.     pravastatin 40 MG tablet  Commonly known as:  PRAVACHOL  Take 1 tablet (40 mg total) by mouth at bedtime.     sertraline 50 MG tablet  Commonly known as:  ZOLOFT  Take one tablet daily.     topiramate 50 MG tablet  Commonly known as:  TOPAMAX  Take one tablet twice daily        Diagnostic Studies: Ct Abdomen Pelvis Wo Contrast  08/23/2014   CLINICAL DATA:  Postop from right renal stone extraction. Bleeding around nephrostomy tube. Evaluate for retroperitoneal hemorrhage.  EXAM: CT ABDOMEN AND PELVIS WITHOUT CONTRAST  TECHNIQUE: Multidetector CT imaging of the abdomen and pelvis was performed following the standard protocol without IV contrast.  COMPARISON:  08/07/14  FINDINGS:  Lower chest: New small right pleural effusion and bibasilar atelectasis, right side greater than left.  Hepatobiliary: No mass visualized on  this unenhanced exam. Diffuse hepatic steatosis again demonstrated as well as prior cholecystectomy.  Pancreas: No mass or inflammatory process visualized on this unenhanced exam.  Spleen: No evidence of splenomegaly. Stable cystic lesion with mural calcification in the medial aspect of the spleen.  Adrenal Glands:  No masses identified.  Kidneys/Urinary tract: A right percutaneous nephrostomy tube is now seen in place with a right ureteral stent extending into the pelvic portion of the right ureter. Mild right perinephric hematoma is seen, without extension into other areas of retroperitoneum. Previously seen calculus in the right renal pelvis is no longer visualized. Left kidney is unremarkable in appearance on this noncontrast exam.  Stomach/Bowel/Peritoneum:  Unremarkable.  Vascular/Lymphatic: No pathologically enlarged lymph nodes identified. No other significant abnormality identified.  Reproductive: No mass or other significant abnormality noted. Prior hysterectomy again noted.  Other:  None.  Musculoskeletal:  No suspicious bone lesions identified.  IMPRESSION: Right percutaneous nephrostomy and internal ureteral stent extending into the pelvic portion of the right ureter.  Mild right perinephric hematoma.  New small right pleural effusion and bibasilar atelectasis.  Stable hepatic steatosis and benign appearing cystic lesion in the spleen.   Electronically Signed   By: Earle Gell M.D.   On: 08/23/2014 09:15   US Abdomen Complete  08/16/2014   CLINICAL DATA:  Abdominal and flank pain  EXAM: ULTRASOUND ABDOMEN COMPLETE  COMPARISON:  08/07/2014  FINDINGS: Gallbladder: Surgically removed  Common bile duct: Diameter: 8 mm  Liver: Fatty infiltrated without focal mass. A small 8 mm hypoechoic area is noted likely representing a small cyst.  IVC: No abnormality visualized.   Pancreas: Visualized portion unremarkable.  Spleen: Cystic lesion is again identified with some peripheral calcifications similar to that noted on prior CT examination.  Right Kidney: Length: 11.3 cm. A 13 mm echogenic focus is identified in the right renal pelvis consistent with a renal pelvic stone. This is stable from the recent CT examination. This may cause some flank pain related to motion within the renal pelvis.  Left Kidney: Length: 12.3 cm. Echogenicity within normal limits. No mass or hydronephrosis visualized.  Abdominal aorta: No aneurysm visualized.  Other findings: None.  IMPRESSION: Right renal pelvic stone. This may cause some symptomatology with a ball valve affect within the collecting system although no obstructive changes are noted currently.  Hypoechoic structure within the spleen with peripheral calcifications similar to that noted on the prior CT examination.  Fatty liver.  Status post cholecystectomy.   Electronically Signed   By: Inez Catalina M.D.   On: 08/16/2014 20:51   Ct Abdomen Pelvis W Contrast  08/07/2014   CLINICAL DATA:  Right upper quadrant pain  EXAM: CT ABDOMEN AND PELVIS WITH CONTRAST  TECHNIQUE: Multidetector CT imaging of the abdomen and pelvis was performed using the standard protocol following bolus administration of intravenous contrast.  CONTRAST:  1109mL OMNIPAQUE IOHEXOL 300 MG/ML  SOLN  COMPARISON:  None.  FINDINGS: Lung bases are free of acute infiltrate or sizable effusion.  The liver is homogeneous in attenuation but diffusely decreased consistent with fatty infiltration. The gallbladder has been surgically removed. The spleen shows a rounded cystic structure which measures 4.8 cm in greatest dimension. It has some peripheral calcification is noted within likely representing a large splenic cysts. This could represent a prior splenic hematoma given the calcification. The adrenal glands and pancreas are within normal limits. The kidneys are well visualized  bilaterally. A large right renal stone is noted within the renal pelvis. It  measures approximately 14 mm in greatest dimension. No obstructive changes are seen.  The bladder is decompressed. The appendix has been surgically removed. No pelvic mass lesion is seen. The osseous structures show no acute abnormality.  IMPRESSION: 14 mm right renal pelvic stone without obstructive change.  Cystic lesion with peripheral calcifications within the spleen likely representing a mildly complicated splenic cyst or old hematoma.  Fatty liver.  No other focal abnormality is seen.   Electronically Signed   By: Inez Catalina M.D.   On: 08/07/2014 11:56   Dg C-arm 1-60 Min-no Report  08/20/2014   CLINICAL DATA: surgery   C-ARM 1-60 MINUTES  Fluoroscopy was utilized by the requesting physician.  No radiographic  interpretation.    Ir Nephrostomy Exchange Right  08/24/2014   INDICATION: History of right-sided percutaneous nephrostomy catheter placement with subsequent nephrolithotomy on 08/19/2014. Operative procedure complicated by development of a small right-sided perinephric hemorrhage as demonstrated on CT scan performed 08/23/2014. Please remove malpositioned Red Robin catheter and convert existing Kumpe catheter to a right-sided percutaneous nephrostomy catheter to maintain continued percutaneous access.  EXAM: FLUOROSCOPIC GUIDED RIGHT SIDED NEPHROSTOMY CATHETER EXCHANGE  COMPARISON:  Fluoroscopic guided right-sided percutaneous nephrostomy catheter placement for nephrolithotomy - 08/19/2014; CT abdomen pelvis - 08/23/2014  CONTRAST:  10 mL Isovue-300 administered into the collecting system  FLUOROSCOPY TIME:  2 minutes 54 seconds (37 mGy).  COMPLICATIONS: None immediate  TECHNIQUE: Informed written consent was obtained from the patient after a discussion of the risks, benefits and alternatives to treatment. Questions regarding the procedure were encouraged and answered. A timeout was performed prior to the initiation of the  procedure.  The right flank and external portions of existing Red Robin and Kumpe catheters were prepped and draped in the usual sterile fashion. A sterile drape was applied covering the operative field. Maximum barrier sterile technique with sterile gowns and gloves were used for the procedure. A timeout was performed prior to the initiation of the procedure.  A pre procedural spot fluoroscopic image was obtained. The red Robin catheter was cut and removed intact.  Contrast injection was performed via the existing Kumpe the catheter demonstrating appropriate positioning within the distal aspect of the right ureter.  The existing Kumpe catheter was cannulated with a Benson wire which was coiled within the renal pelvis. Under intermittent fluoroscopic guidance, the existing nephrostomy catheter was exchanged for a new 10.2 [place] all-purpose drainage catheter. Contrast injection confirmed appropriate positioning within the renal pelvis and a post exchange fluoroscopic image was obtained. The catheter was locked and secured to the skin with a suture. A dressing was placed. The patient tolerated the procedure well without immediate postprocedural complication.  FINDINGS: Successful fluoroscopic guided assisted removal of existing right Robin catheter.  After successful fluoroscopic guided exchange, the new nephrostomy catheter is coiled and locked within the right renal pelvis.  IMPRESSION: 1. Successful fluoroscopic guided conversion of right-sided Kumpe catheter to a 10.2 French percutaneous nephrostomy catheter with end coiled and locked within the right renal pelvis. 2. Successful removal of operatively placed Red Robin catheter.   Electronically Signed   By: Sandi Mariscal M.D.   On: 08/24/2014 12:20   Ir Ureteral Stent Right New Access W/o Sep Nephrostomy Cath  08/19/2014   CLINICAL DATA:  Renal stones, access for right percutaneous nephrolithotomy.  EXAM: IR URETERAL STENT RIGHT NEW ACCESS W/O SEP NEPHROSTOMY  CATH  Date: 08/19/2014  PROCEDURE: 1. Percutaneous puncture of the renal collecting system under fluoroscopic guidance 2. Placement of  a percutaneous nephrostomy tube Operating Physician:  Criselda Peaches, MD  COMPARISON:  CT of the abdomen and pelvis - 08/07/2014  ANESTHESIA/SEDATION: Moderate (conscious) sedation was used. 3 mg Versed, 125 mcg Fentanyl were administered intravenously. The patient's vital signs were monitored continuously by radiology nursing throughout the procedure. 400 mg Cipro was administered IV at an appropriate time prior to skin puncture.  Sedation Time: 27 minutes  FLUOROSCOPY TIME:  6 minutes 12 seconds  101 mGy  CONTRAST:  20 mL Omnipaque 350 instilled into the collecting system.  TECHNIQUE: Informed written consent was obtained from the patient after a discussion of the risks, benefits, and alternatives to treatment. The right flank region was prepped with Betadine in a sterile fashion, and a sterile drape was applied covering the operative field. A sterile gown and sterile gloves were used for the procedure. A timeout was performed prior to the initiation of the procedure.  A pre procedural spot fluoroscopic image was obtained of the upper abdomen. Ultrasound scanning performed of the kidney was negative for significant hydronephrosis. As such, the stone within the right renal pelvis was targeted fluoroscopically with a [place] needle. Access to the collecting system was confirmed by a movement of the stone and aspiration of the urine. Contrast injection confirmed access. A small amount of air was injected into the collecting system to help delineate a posterior calyx. A posterior inferior calyx was targeted with a 22 gauge Chiba needle. Access to the calyx was confirmed with advancement of a Nitrex wire into the collecting system. An Accustick set was utilized to dilate the tract and was subsequently exchanged for a Kumpe catheter over a Bentson wire. The Kumpe catheter was  advanced down the ureter and into the urinary bladder. Postprocedural spot radiographs were obtained in various obliquities and the catheter was sutured to the skin. The catheter was capped and a dressing was placed. The patient tolerated the procedure well without immediate postprocedural complication.  COMPLICATIONS: None immediate  IMPRESSION: Successful fluoroscopic guided right percutaneous nephrostomy with placement of a 5 French Kumpe catheter to the level of the urinary bladder to be utilized during impending nephrolithotomy procedure.  Signed,  Criselda Peaches, MD  [facility]  [facility]   Electronically Signed   By: Jacqulynn Cadet M.D.   On: 08/19/2014 17:39    Disposition: 01-Home or Self Care      Discharge Instructions    Discontinue IV    Complete by:  As directed            Follow-up Information    Follow up with Malka So, MD.   Specialty:  Urology   Why:  Please call for an appt for 1-2 weeks post discharge.  The appointment can be with the nurse practitioner if I am not available.   Bring the stone to the office.    Contact information:   [address] [place] [address] [phone]       Follow up with Malka So, MD.   Specialty:  Urology   Why:  I will have the office call for a time to come in Thursday.    Contact information:   [place] [address] [phone]        Signed: Malka So 08/25/2014, 7:54 AM

## 2014-08-25 NOTE — Progress Notes (Signed)
Patient to be discharged with drain in placed in right flank. Husband shown how to properly do dressing changes for when patient is discharged later today.  Husband performed last dressing change prior to d/c. Dressing supplies given to patient for d/c.

## 2014-08-25 NOTE — Care Management Note (Signed)
    Page 1 of 1   08/25/2014     12:19:20 PM CARE MANAGEMENT NOTE 08/25/2014  Patient:  Brandi Mahoney, Brandi Mahoney   Account Number:  [id number]  Date Initiated:  08/25/2014  Documentation initiated by:  Dessa Phi  Subjective/Objective Assessment:   59 y/o f admitted w/renal stone.     Action/Plan:   From home.   Anticipated DC Date:  08/25/2014   Anticipated DC Plan:  [employer]  CM consult      Choice offered to / List presented to:             Status of service:  Completed, signed off Medicare Important Message given?   (If response is "NO", the following Medicare IM given date fields will be blank) Date Medicare IM given:   Medicare IM given by:   Date Additional Medicare IM given:   Additional Medicare IM given by:    Discharge Disposition:  HOME/SELF CARE  Per UR Regulation:  Reviewed for med. necessity/level of care/duration of stay  If discussed at [place] of Stay Meetings, dates discussed:    Comments:  08/25/14 Dessa Phi RN BSN NCm [phone] d/c home no needs or orders.

## 2014-11-06 ENCOUNTER — Encounter: Payer: Self-pay | Admitting: *Deleted

## 2014-11-19 ENCOUNTER — Ambulatory Visit: Payer: PRIVATE HEALTH INSURANCE | Admitting: Internal Medicine

## 2014-11-19 ENCOUNTER — Encounter: Payer: Self-pay | Admitting: General Surgery

## 2014-11-19 ENCOUNTER — Ambulatory Visit (INDEPENDENT_AMBULATORY_CARE_PROVIDER_SITE_OTHER): Payer: No Typology Code available for payment source | Admitting: General Surgery

## 2014-11-19 VITALS — BP 146/84 | HR 70 | Resp 14 | Ht 64.0 in | Wt 168.0 lb

## 2014-11-19 DIAGNOSIS — R0789 Other chest pain: Secondary | ICD-10-CM | POA: Diagnosis not present

## 2014-11-19 NOTE — Progress Notes (Signed)
Patient ID: Brandi Mahoney, female   DOB: [birth year], 59 y.o.   MRN: [id number]  Chief Complaint  Patient presents with  . Breast Problem    HPI Brandi Mahoney is a 59 y.o. female.  who presents for a breast evaluation. The most recent mammogram was done on 06-05-14.  Patient does not perform regular self breast checks and gets regular mammograms done.  She does admit to bilateral breast tenderness (achy and heavy) for about a year. Denies nipple discharge or injury to the breast. Sister (59) just had a bilateral mastectomy and lives in [place]. Paternal grandmother had breast cancer as well.  HPI  Past Medical History  Diagnosis Date  . Asthma   . Depression   . Migraine headache   . IBS (irritable bowel syndrome)   . Hypothyroidism   . Myocarditis 01/21/2010    hospitalized- [facility]  . Hemorrhoids   . HLD (hyperlipidemia)   . History of pneumonia 1969  . NSTEMI (non-ST elevated myocardial infarction), 01/2010 [facility] 09/19/2013    Secondary to viral myocarditis   . Diabetes mellitus     type II off medication    Past Surgical History  Procedure Laterality Date  . Appendectomy  1974  . Cholecystectomy  2002  . Total abdominal hysterectomy w/ bilateral salpingoophorectomy  2000    no ovaries, uterus, or cervix.  . Cardiac catheterization  8/11    no significant -[facility] Dr Rockey Situ,  . Breast biopsy  1988  . Nephrolithotomy Right 08/20/2014    Procedure: RIGHT PERCUTANEOUS NEPHROLITHOTOMY ;  Surgeon: Malka So, MD;  Location: [facility];  Service: Urology;  Laterality: Right;    Family History  Problem Relation Age of Onset  . Breast cancer Paternal Grandmother   . Lung cancer Paternal Grandfather     CAD  . Coronary artery disease Mother   . Diabetes Mother   . ALS Mother   . Other Daughter     gluten  . Hyperlipidemia Father   . Down syndrome Son   . Colon cancer Maternal Grandfather   . Cancer Sister 62    breast    Social History History  Substance Use Topics  .  Smoking status: Former Smoker -- 0.35 packs/day for 35 years    Types: Cigarettes    Quit date: 09/25/2008  . Smokeless tobacco: Never Used     Comment: quit in 2007-2008, social smoker  . Alcohol Use: Yes     Comment: rarely    Allergies  Allergen Reactions  . Pseudoeph-Doxylamine-Dm-Apap Anaphylaxis    Allergy to nyquil  . Gluten Other (See Comments)    Gi upset  . Penicillins Itching and Rash    Current Outpatient Prescriptions  Medication Sig Dispense Refill  . aspirin EC 81 MG tablet Take 81 mg by mouth daily.    . fenofibrate (TRICOR) 145 MG tablet Take 1 tablet (145 mg total) by mouth daily. (Patient taking differently: Take 145 mg by mouth every other day. Alternate with pravastatin) 90 tablet 3  . glucose blood (ACCU-CHEK AVIVA) test strip Use as instructed     . levothyroxine (SYNTHROID, LEVOTHROID) 50 MCG tablet Take 1 tablet (50 mcg total) by mouth daily. 90 tablet 3  . nitroGLYCERIN (NITROSTAT) 0.4 MG SL tablet 1 tablet under tongue at onset of chest pain; you may repeat every 5 minutes for up to 3 doses. (Patient taking differently: Place 0.4 mg under the tongue every 5 (five) minutes as needed for chest pain. you  may repeat every 5 minutes for up to 3 doses.) 25 tablet 12  . pravastatin (PRAVACHOL) 40 MG tablet Take 1 tablet (40 mg total) by mouth at bedtime. (Patient taking differently: Take 40 mg by mouth every other day. Alternate with fenofibrate) 90 tablet 3  . sertraline (ZOLOFT) 50 MG tablet Take one tablet daily. (Patient taking differently: Take 50 mg by mouth daily. ) 90 tablet 3  . topiramate (TOPAMAX) 50 MG tablet Take one tablet twice daily (Patient taking differently: Take 50 mg by mouth daily. ) 180 tablet 3   No current facility-administered medications for this visit.    Review of Systems Review of Systems  Constitutional: Negative.   Respiratory: Negative.   Cardiovascular: Negative.     Blood pressure 146/84, pulse 70, resp. rate 14, height 5'  4" (1.626 m), weight 168 lb (76.204 kg).  Physical Exam Physical Exam  Constitutional: She is oriented to person, place, and time. She appears well-developed and well-nourished.  Cardiovascular: Normal rate, regular rhythm and normal heart sounds.   Pulmonary/Chest: Effort normal and breath sounds normal. She exhibits tenderness. Right breast exhibits no inverted nipple, no mass, no nipple discharge, no skin change and no tenderness. Left breast exhibits no inverted nipple, no mass, no nipple discharge, no skin change and no tenderness. Breasts are symmetrical.    Neurological: She is alert and oriented to person, place, and time.  Skin: Skin is warm and dry.    Data Reviewed Mammograms of 06/05/2014 completed at [facility] were reviewed. Symmetrical moderate breast density. No dominant lesions, mass effect or microcalcifications of concern. Birad-2. Laboratory studies of February 2016 showed a creatinine of 0.7 with estimated GFR 71.   Assessment    Benign breast exam.  Lateral chest wall tenderness bilaterally.    Plan    The patient has been encouraged to make use of a two-week trial of Aleve, 2 tablets twice a day. Local heat application is been encouraged. No reduction in activities required.  She will give a follow-up progress report at the time of planned cyst excision from left chest wall      PCP:  [place], [place] Ref: Dr Percell [place], [place] 11/20/2014, 12:04 PM

## 2014-11-20 DIAGNOSIS — R0789 Other chest pain: Secondary | ICD-10-CM | POA: Insufficient documentation

## 2014-12-14 ENCOUNTER — Other Ambulatory Visit: Payer: Self-pay | Admitting: Family Medicine

## 2014-12-15 NOTE — Telephone Encounter (Signed)
Last office visit 08/10/2014.  Not on current medication list.  Ok to refill?

## 2014-12-17 ENCOUNTER — Ambulatory Visit (INDEPENDENT_AMBULATORY_CARE_PROVIDER_SITE_OTHER): Payer: No Typology Code available for payment source | Admitting: General Surgery

## 2014-12-17 ENCOUNTER — Encounter: Payer: Self-pay | Admitting: General Surgery

## 2014-12-17 VITALS — BP 142/70 | HR 74 | Resp 12 | Ht 64.0 in | Wt 167.0 lb

## 2014-12-17 DIAGNOSIS — R222 Localized swelling, mass and lump, trunk: Secondary | ICD-10-CM

## 2014-12-17 NOTE — Patient Instructions (Signed)
One week nurse.  

## 2014-12-17 NOTE — Progress Notes (Signed)
Patient ID: Brandi Mahoney, female   DOB: Jan 29, 1956, 59 y.o.   MRN: [id number]  Chief Complaint  Patient presents with  . Procedure    chest wall excision    HPI Brandi Mahoney is a 59 y.o. female here today for excision chest wall mass.  HPI  Past Medical History  Diagnosis Date  . Asthma   . Depression   . Migraine headache   . IBS (irritable bowel syndrome)   . Hypothyroidism   . Myocarditis 01/21/2010    hospitalized- [facility]  . Hemorrhoids   . HLD (hyperlipidemia)   . History of pneumonia 1969  . NSTEMI (non-ST elevated myocardial infarction), 01/2010 [facility] 09/19/2013    Secondary to viral myocarditis   . Diabetes mellitus     type II off medication    Past Surgical History  Procedure Laterality Date  . Appendectomy  1974  . Cholecystectomy  2002  . Total abdominal hysterectomy w/ bilateral salpingoophorectomy  2000    no ovaries, uterus, or cervix.  . Cardiac catheterization  8/11    no significant -[facility]- Dr Rockey Situ,  . Breast biopsy  1988  . Nephrolithotomy Right 08/20/2014    Procedure: RIGHT PERCUTANEOUS NEPHROLITHOTOMY ;  Surgeon: Malka So, MD;  Location: [facility];  Service: Urology;  Laterality: Right;    Family History  Problem Relation Age of Onset  . Breast cancer Paternal Grandmother   . Lung cancer Paternal Grandfather     CAD  . Coronary artery disease Mother   . Diabetes Mother   . ALS Mother   . Other Daughter     gluten  . Hyperlipidemia Father   . Down syndrome Son   . Colon cancer Maternal Grandfather   . Cancer Sister 53    breast    Social History History  Substance Use Topics  . Smoking status: Former Smoker -- 0.35 packs/day for 35 years    Types: Cigarettes    Quit date: 09/25/2008  . Smokeless tobacco: Never Used     Comment: quit in 2007-2008, social smoker  . Alcohol Use: Yes     Comment: rarely    Allergies  Allergen Reactions  . Pseudoeph-Doxylamine-Dm-Apap Anaphylaxis    Allergy to nyquil  . Gluten Other (See  Comments)    Gi upset  . Penicillins Itching and Rash    Current Outpatient Prescriptions  Medication Sig Dispense Refill  . aspirin EC 81 MG tablet Take 81 mg by mouth daily.    . fenofibrate (TRICOR) 145 MG tablet Take 1 tablet (145 mg total) by mouth daily. (Patient taking differently: Take 145 mg by mouth every other day. Alternate with pravastatin) 90 tablet 3  . glucose blood (ACCU-CHEK AVIVA) test strip Use as instructed     . levothyroxine (SYNTHROID, LEVOTHROID) 50 MCG tablet Take 1 tablet (50 mcg total) by mouth daily. 90 tablet 3  . nitroGLYCERIN (NITROSTAT) 0.4 MG SL tablet 1 tablet under tongue at onset of chest pain; you may repeat every 5 minutes for up to 3 doses. (Patient taking differently: Place 0.4 mg under the tongue every 5 (five) minutes as needed for chest pain. you may repeat every 5 minutes for up to 3 doses.) 25 tablet 12  . pantoprazole (PROTONIX) 40 MG tablet TAKE 1 TABLET (40 MG TOTAL) BY MOUTH DAILY. 30 tablet 11  . pravastatin (PRAVACHOL) 40 MG tablet Take 1 tablet (40 mg total) by mouth at bedtime. (Patient taking differently: Take 40 mg by mouth every  other day. Alternate with fenofibrate) 90 tablet 3  . sertraline (ZOLOFT) 50 MG tablet Take one tablet daily. (Patient taking differently: Take 50 mg by mouth daily. ) 90 tablet 3  . topiramate (TOPAMAX) 50 MG tablet Take one tablet twice daily (Patient taking differently: Take 50 mg by mouth daily. ) 180 tablet 3   No current facility-administered medications for this visit.    Review of Systems Review of Systems  Blood pressure 142/70, pulse 74, resp. rate 12, height 5\' 4"  (1.626 m), weight 167 lb (75.751 kg).  Physical Exam Physical Exam  Pulmonary/Chest:         Assessment    Sebaceous cyst of the left anterior chest wall.    Plan    The area was cleansed with alcohol and 10 mL of 0.5% Xylocaine with 0.25% Marcaine with 1-200,000 units of epinephrine was utilized well tolerated. Chlor prep  was applied to the skin. The area was excised through elliptical incision taking the central core with assessment. No bleeding was noted. The skin defect was closed with interrupted 4-0 nylon sutures. A dry dressing with Telfa and Tegaderm was applied.  Wound care instructions were reviewed. The patient will return in one week for suture removal.     PCP:  [place]  Gaspar Cola 12/17/2014, 4:19 PM

## 2014-12-18 DIAGNOSIS — R222 Localized swelling, mass and lump, trunk: Secondary | ICD-10-CM | POA: Insufficient documentation

## 2014-12-24 ENCOUNTER — Ambulatory Visit (INDEPENDENT_AMBULATORY_CARE_PROVIDER_SITE_OTHER): Payer: No Typology Code available for payment source | Admitting: *Deleted

## 2014-12-24 DIAGNOSIS — R222 Localized swelling, mass and lump, trunk: Secondary | ICD-10-CM

## 2014-12-24 NOTE — Progress Notes (Signed)
Patient came in today for a wound check.  The wound is clean, with no signs of infection noted. The sutures were removed and steri strips applied.  

## 2015-02-12 ENCOUNTER — Telehealth: Payer: Self-pay | Admitting: Internal Medicine

## 2015-02-12 DIAGNOSIS — G4733 Obstructive sleep apnea (adult) (pediatric): Secondary | ICD-10-CM

## 2015-02-12 NOTE — Telephone Encounter (Signed)
Spoke with pt. She had HST done 10/2013. She had to cancel her appt she had to discuss results last year but is wondering now if we can give results over the phone. Please advise Dr. Annamaria Boots thanks

## 2015-02-12 NOTE — Telephone Encounter (Signed)
Pt returned call  [phone]

## 2015-02-12 NOTE — Telephone Encounter (Signed)
lmtcb X1 for pt  

## 2015-02-12 NOTE — Telephone Encounter (Signed)
Her unattended home sleep test on 10/22/13 demonstrated severe obstructive sleep apnea syndrome. She was stopping breathing 37 times per hour.  The usual treatment of choice for scores in that range is CPAP air pressure therapy. The alternative might be an oral appliance. Most people would tell her to try CPAP first.  If she is willing to return here for follow-up, then we can order :     New DME, new CPAP auto 5-2-, humidifier, mask of choice, supplies    For dx OSA  She would need to go ahead and make an appointment for return ov within the insurance time window of 31-90 days after stating CPAP. We would go ahead and make that appointment.

## 2015-02-15 NOTE — Telephone Encounter (Signed)
LMTC x 1  

## 2015-02-16 NOTE — Telephone Encounter (Signed)
lmomtcb x 2  

## 2015-02-17 NOTE — Telephone Encounter (Signed)
Pt called back for results. She does want to go ahead with CPAP therapy. Katie, please advise where pt can be scheduled int he time frame as stated by Dr. Annamaria Boots thanks

## 2015-02-17 NOTE — Telephone Encounter (Signed)
[phone], pt cb

## 2015-02-17 NOTE — Telephone Encounter (Signed)
Left message for pt to call back to discuss results. 

## 2015-02-17 NOTE — Telephone Encounter (Signed)
lmtcb for pt.  

## 2015-02-19 NOTE — Telephone Encounter (Signed)
Left detailed message for patient to call back and schedule OV in 6 weeks with Dr. Annamaria Boots for CPAP follow up Awaiting call back from patient (Dr. Annamaria Boots has an opening at the end of October)

## 2015-02-23 NOTE — Telephone Encounter (Signed)
Left message for pt to call back  °

## 2015-02-24 NOTE — Telephone Encounter (Signed)
lmtcb for pt.  

## 2015-04-02 ENCOUNTER — Ambulatory Visit (INDEPENDENT_AMBULATORY_CARE_PROVIDER_SITE_OTHER): Payer: PRIVATE HEALTH INSURANCE | Admitting: Family Medicine

## 2015-04-02 ENCOUNTER — Encounter: Payer: Self-pay | Admitting: Family Medicine

## 2015-04-02 VITALS — BP 128/93 | HR 99 | Temp 98.9°F | Ht 64.0 in | Wt 167.5 lb

## 2015-04-02 DIAGNOSIS — J069 Acute upper respiratory infection, unspecified: Secondary | ICD-10-CM

## 2015-04-02 DIAGNOSIS — B9789 Other viral agents as the cause of diseases classified elsewhere: Principal | ICD-10-CM

## 2015-04-02 MED ORDER — GUAIFENESIN-CODEINE 100-10 MG/5ML PO SYRP: 5.0000 mL | ORAL_SOLUTION | Freq: Every evening | ORAL | Status: DC | PRN

## 2015-04-02 NOTE — Progress Notes (Signed)
Pre visit review using our clinic review tool, if applicable. No additional management support is needed unless otherwise documented below in the visit note. 

## 2015-04-02 NOTE — Progress Notes (Signed)
   Subjective:    Patient ID: Brandi Mahoney, female    DOB: 16-May-1956, 59 y.o.   MRN: [id number]  Cough This is a new problem. The current episode started in the past 7 days. The problem has been gradually worsening. The cough is non-productive. Associated symptoms include chills, nasal congestion, postnasal drip, rhinorrhea and a sore throat. Pertinent negatives include no fever, myalgias or wheezing. Associated symptoms comments: Left ear pain, Sinus pain and pressure. The symptoms are aggravated by lying down (keeping her up at night). Risk factors: former smoker, quit remotely. Treatments tried: tylenol cold. Her past medical history is significant for asthma. There is no history of COPD or environmental allergies. mild intermittent      Review of Systems  Constitutional: Positive for chills. Negative for fever.  HENT: Positive for postnasal drip, rhinorrhea and sore throat.   Respiratory: Negative for wheezing.   Musculoskeletal: Negative for myalgias.  Allergic/Immunologic: Negative for environmental allergies.       Objective:   Physical Exam  Constitutional: Vital signs are normal. She appears well-developed and well-nourished. She is cooperative.  Non-toxic appearance. She does not appear ill. No distress.  HENT:  Head: Normocephalic.  Right Ear: Hearing, external ear and ear canal normal. Tympanic membrane is not erythematous, not retracted and not bulging. A middle ear effusion is present.  Left Ear: Hearing, external ear and ear canal normal. Tympanic membrane is not erythematous, not retracted and not bulging. A middle ear effusion is present.  Nose: Mucosal edema and rhinorrhea present. Right sinus exhibits no maxillary sinus tenderness and no frontal sinus tenderness. Left sinus exhibits no maxillary sinus tenderness and no frontal sinus tenderness.  Mouth/Throat: Uvula is midline, oropharynx is clear and moist and mucous membranes are normal.  Eyes: Conjunctivae, EOM  and lids are normal. Pupils are equal, round, and reactive to light. Lids are everted and swept, no foreign bodies found.  Neck: Trachea normal and normal range of motion. Neck supple. Carotid bruit is not present. No thyroid mass and no thyromegaly present.  Cardiovascular: Normal rate, regular rhythm, S1 normal, S2 normal, normal heart sounds, intact distal pulses and normal pulses.  Exam reveals no gallop and no friction rub.   No murmur heard. Pulmonary/Chest: Effort normal and breath sounds normal. No tachypnea. No respiratory distress. She has no decreased breath sounds. She has no wheezes. She has no rhonchi. She has no rales.  Neurological: She is alert.  Skin: Skin is warm, dry and intact. No rash noted.  Psychiatric: Her speech is normal and behavior is normal. Judgment normal. Her mood appears not anxious. Cognition and memory are normal. She does not exhibit a depressed mood.          Assessment & Plan:

## 2015-04-02 NOTE — Patient Instructions (Signed)
Mucinex DM  twice daily for cough.  Start nasal saline irrigation.  Start cough suppressant at night.  Call if not turning the corner in next 7-10 days or earlier shortness of breath.

## 2015-04-02 NOTE — Assessment & Plan Note (Signed)
Symptomatic care, reviewed viral timeline.

## 2015-04-14 ENCOUNTER — Ambulatory Visit (INDEPENDENT_AMBULATORY_CARE_PROVIDER_SITE_OTHER): Payer: No Typology Code available for payment source | Admitting: General Surgery

## 2015-04-14 ENCOUNTER — Encounter: Payer: Self-pay | Admitting: General Surgery

## 2015-04-14 VITALS — BP 130/76 | HR 78 | Resp 12 | Ht 64.0 in | Wt 171.0 lb

## 2015-04-14 DIAGNOSIS — R0789 Other chest pain: Secondary | ICD-10-CM | POA: Diagnosis not present

## 2015-04-14 DIAGNOSIS — M25511 Pain in right shoulder: Secondary | ICD-10-CM

## 2015-04-14 DIAGNOSIS — M79621 Pain in right upper arm: Secondary | ICD-10-CM

## 2015-04-14 NOTE — Progress Notes (Signed)
Patient ID: Brandi Mahoney, female   DOB: 02-21-1956, 59 y.o.   MRN: [id number]  Chief Complaint  Patient presents with  . Other    right breast pain    HPI Brandi Mahoney is a 59 y.o. female here today for a evaluation of right breast pain that goes under her armpit. She states this has been going on for 6 months.Patient states the pain comes and goes and last for about a few minutes. Last mammogram was done on 06/05/14.   The patient was evaluated in June 2016 in regards to bilateral chest wall pain. The left side it has resolved with the right side remains. This is slightly increasing in frequency but is still very transient nature. Aching sensation, not aggravated by physical activity. A trial of anti-inflammatories in summer did not produce any significant improvement. She rarely takes any medication for this as it would resolve before she would get to the medicine cabinet.  The patient reports being left-handed and carrying her purse on the left shoulder. No history of trauma to the right side chest wall.  Clinical history above was confirmed with the patient during her visit.  Note patient: The patient has not had any cardiac event to her knowledge. HPI  Past Medical History  Diagnosis Date  . Asthma   . Depression   . Migraine headache   . IBS (irritable bowel syndrome)   . Hypothyroidism   . Myocarditis ([place]) 01/21/2010    hospitalized- [facility]  . Hemorrhoids   . HLD (hyperlipidemia)   . History of pneumonia 1969  . NSTEMI (non-ST elevated myocardial infarction), 01/2010 [facility] 09/19/2013    Secondary to viral myocarditis   . Diabetes mellitus     type II off medication    Past Surgical History  Procedure Laterality Date  . Appendectomy  1974  . Cholecystectomy  2002  . Total abdominal hysterectomy w/ bilateral salpingoophorectomy  2000    no ovaries, uterus, or cervix.  . Cardiac catheterization  8/11    no significant -[facility]- Dr Rockey Situ,  . Breast biopsy  1988  .  Nephrolithotomy Right 08/20/2014    Procedure: RIGHT PERCUTANEOUS NEPHROLITHOTOMY ;  Surgeon: Malka So, MD;  Location: [facility];  Service: Urology;  Laterality: Right;  . Colonoscopy  2013    Family History  Problem Relation Age of Onset  . Breast cancer Paternal Grandmother   . Lung cancer Paternal Grandfather     CAD  . Coronary artery disease Mother   . Diabetes Mother   . ALS Mother   . Other Daughter     gluten  . Hyperlipidemia Father   . Down syndrome Son   . Colon cancer Maternal Grandfather   . Cancer Sister 46    breast    Social History Social History  Substance Use Topics  . Smoking status: Former Smoker -- 0.35 packs/day for 35 years    Types: Cigarettes    Quit date: 09/25/2008  . Smokeless tobacco: Never Used     Comment: quit in 2007-2008, social smoker  . Alcohol Use: Yes     Comment: rarely    Allergies  Allergen Reactions  . Pseudoeph-Doxylamine-Dm-Apap Anaphylaxis    Allergy to nyquil  . Gluten Other (See Comments)    Gi upset  . Penicillins Itching and Rash    Current Outpatient Prescriptions  Medication Sig Dispense Refill  . aspirin EC 81 MG tablet Take 81 mg by mouth daily.    . fenofibrate (  TRICOR) 145 MG tablet Take 1 tablet (145 mg total) by mouth daily. (Patient taking differently: Take 145 mg by mouth every other day. Alternate with pravastatin) 90 tablet 3  . glucose blood (ACCU-CHEK AVIVA) test strip Use as instructed     . guaiFENesin-codeine (ROBITUSSIN AC) 100-10 MG/5ML syrup Take 5-10 mLs by mouth at bedtime as needed for cough. 180 mL 0  . levothyroxine (SYNTHROID, LEVOTHROID) 50 MCG tablet Take 1 tablet (50 mcg total) by mouth daily. 90 tablet 3  . nitroGLYCERIN (NITROSTAT) 0.4 MG SL tablet 1 tablet under tongue at onset of chest pain; you may repeat every 5 minutes for up to 3 doses. (Patient taking differently: Place 0.4 mg under the tongue every 5 (five) minutes as needed for chest pain. you may repeat every 5 minutes for up to  3 doses.) 25 tablet 12  . pravastatin (PRAVACHOL) 40 MG tablet Take 1 tablet (40 mg total) by mouth at bedtime. (Patient taking differently: Take 40 mg by mouth every other day. Alternate with fenofibrate) 90 tablet 3  . sertraline (ZOLOFT) 50 MG tablet Take one tablet daily. (Patient taking differently: Take 50 mg by mouth daily. ) 90 tablet 3  . topiramate (TOPAMAX) 50 MG tablet Take one tablet twice daily (Patient taking differently: Take 50 mg by mouth daily. ) 180 tablet 3   No current facility-administered medications for this visit.    Review of Systems Review of Systems  Constitutional: Negative.   Respiratory: Negative.   Cardiovascular: Negative.     Blood pressure 130/76, pulse 78, resp. rate 12, height 5\' 4"  (1.626 m), weight 171 lb (77.565 kg).  Physical Exam Physical Exam  Constitutional: She is oriented to person, place, and time. She appears well-developed and well-nourished.  Eyes: Conjunctivae are normal. No scleral icterus.  Neck: Neck supple.  Cardiovascular: Normal rate, regular rhythm and normal heart sounds.   Pulmonary/Chest: Effort normal and breath sounds normal.   She exhibits tenderness. Right breast exhibits no inverted nipple, no mass, no nipple discharge, no skin change and no tenderness. Left breast exhibits no inverted nipple, no mass, no nipple discharge, no skin change and no tenderness.    No breast pathology noted.  No focal breast tenderness.  Lymphadenopathy:    She has no cervical adenopathy.    She has no axillary adenopathy.  Right axillary tenderness unrelated to lymphadenopathy. Appears confined to the muscles on the anterior posterior aspect of the shoulder.   Neurological: She is alert and oriented to person, place, and time.  Skin: Skin is warm and dry.   Tenderness at infer s Patient is left handed.     Assessment    Chest wall pain. No evidence of breast pathology.    Plan    The source the patient's pain is unclear.  I'm not sure that orthopedic referral is needed, but will discuss informally with the orthopedic service.   Patient to return as needed.  PCP:  Kathlyn [place] 04/14/2015, 8:27 PM

## 2015-04-14 NOTE — Patient Instructions (Signed)
Patient to return as needed. 

## 2015-04-22 ENCOUNTER — Telehealth: Payer: Self-pay | Admitting: *Deleted

## 2015-04-22 NOTE — Telephone Encounter (Signed)
Dr Bary Castilla talked with Dr Margaretmary Eddy (orthopedics) regarding her shoulder issues. Dr Margaretmary Eddy will be glad to see her, if she wants [place] to make that appointment for her we can.

## 2015-05-28 ENCOUNTER — Telehealth: Payer: Self-pay | Admitting: *Deleted

## 2015-05-28 NOTE — Telephone Encounter (Signed)
Left message for [name] to call the office to schedule her flu shot or let us know if he had one done elsewhere.

## 2015-05-28 NOTE — Telephone Encounter (Signed)
Patient returned Brandi Mahoney's call.  Patient had her flu shot at work in October.

## 2015-05-28 NOTE — Telephone Encounter (Signed)
Noted  

## 2015-06-02 ENCOUNTER — Telehealth: Payer: Self-pay | Admitting: *Deleted

## 2015-06-02 NOTE — Telephone Encounter (Signed)
Received fax from Kristopher Oppenheim requesting PA for Fenofibrate 145 mg.  PA completed on CoverMyMeds.  Awaiting Outcome.

## 2015-06-02 NOTE — Telephone Encounter (Signed)
Fenofibrate approved until 06/01/2016.  Kristopher Oppenheim notified.

## 2015-06-20 HISTORY — PX: COLONOSCOPY: SHX174

## 2015-06-30 ENCOUNTER — Telehealth: Payer: Self-pay | Admitting: Family Medicine

## 2015-06-30 NOTE — Telephone Encounter (Signed)
Pt has appt with Allie Bossier NP 07/01/15 at 7:15 pm.

## 2015-06-30 NOTE — Telephone Encounter (Signed)
[facility]  Patient Name: Brandi Mahoney  DOB: 12/07/1955    Initial Comment Caller states she has hx of migraines. Got one Monday, during sex, worst ever. Still has one, but not nearly as painful 4/10.   Nurse Assessment  Nurse: Wayne Sever, RN, Tillie Rung Date/Time (Eastern Time): 06/30/2015 12:45:19 PM  Confirm and document reason for call. If symptomatic, describe symptoms. ---Caller states she has a history of migraine. She states she got a migraine during sex on Monday. She states it was the worst headache she ever had. She put ice on her head for the pain. She took 2 Aleve and rested. She takes Topomax everyday to prevent migraines. Her head is a 4 right now and she does not feel right.  Has the patient traveled out of the country within the last 30 days? ---Not Applicable  Does the patient have any new or worsening symptoms? ---Yes  Will a triage be completed? ---Yes  Related visit to physician within the last 2 weeks? ---N/A  Does the PT have any chronic conditions? (i.e. diabetes, asthma, etc.) ---Yes  List chronic conditions. ---Migraines, Gluten Issues  Is this a behavioral health or substance abuse call? ---No     Guidelines    Guideline Title Affirmed Question Affirmed Notes  Headache [1] MODERATE headache (e.g., interferes with normal activities) AND [2] present > 24 hours AND [3] unexplained (Exceptions: analgesics not tried, typical migraine, or headache part of viral illness)    Final Disposition User   See Physician within [place], RN, [employer]    Referrals  REFERRED TO PCP OFFICE   Disagree/Comply: Leta Baptist

## 2015-07-01 ENCOUNTER — Ambulatory Visit (INDEPENDENT_AMBULATORY_CARE_PROVIDER_SITE_OTHER): Payer: 59 | Admitting: Primary Care

## 2015-07-01 ENCOUNTER — Encounter: Payer: Self-pay | Admitting: Primary Care

## 2015-07-01 VITALS — BP 128/72 | HR 69 | Temp 98.2°F | Ht 64.0 in | Wt 173.4 lb

## 2015-07-01 DIAGNOSIS — G43901 Migraine, unspecified, not intractable, with status migrainosus: Secondary | ICD-10-CM

## 2015-07-01 MED ORDER — KETOROLAC TROMETHAMINE 60 MG/2ML IM SOLN
60.0000 mg | Freq: Once | INTRAMUSCULAR | Status: AC
  Administered 2015-07-01: 60 mg via INTRAMUSCULAR

## 2015-07-01 MED ORDER — ONDANSETRON 4 MG PO TBDP
4.0000 mg | ORAL_TABLET | Freq: Three times a day (TID) | ORAL | Status: DC | PRN
Start: 2015-07-01 — End: 2015-12-14

## 2015-07-01 NOTE — Progress Notes (Signed)
Pre visit review using our clinic review tool, if applicable. No additional management support is needed unless otherwise documented below in the visit note. 

## 2015-07-01 NOTE — Patient Instructions (Signed)
You will be contacted regarding your referral to Neurology. You once saw Dr. Manuella Ghazi in the past at [facility].  Please let us know if you have not heard back within one week.   I've sent in a prescription for Zofran 4 mg. Melt 1 tablet in your mouth every 8 hours as needed for nausea.   Please notify me if your migraine becomes worse between now and your appointment with neurology.  It was a pleasure meeting you!

## 2015-07-01 NOTE — Progress Notes (Signed)
Subjective:    Patient ID: Brandi Mahoney, female    DOB: 02-Sep-1955, 60 y.o.   MRN: [id number]  HPI  Ms. Polasek is a 60 year old female who presents today with a chief complaint of migraine. Her headache began Sunday during intercourse. Inititally her migraine was located to the entire cranium (all lobes). Her headache improved after 15 minutes but still remains today.  She applied a cold pack, turned off the lights, and took aleve during the acute attack. Since then her headache has gradually improved and is still located to the entire cranium. She's been taking aleve since with temporary improvement. She's currently managed on Topamax 50 mg daily.  She was once evaluated by a neurologist for her migraines, has a history of severe headaches in the past, and was told to follow up if this occurred again. Once managed on Imitrex, didn't feel as though it worked well during acute attacks.  Review of Systems  Eyes: Positive for photophobia.  Respiratory: Negative for shortness of breath.   Cardiovascular: Negative for chest pain.  Gastrointestinal: Positive for nausea.  Neurological: Positive for headaches. Negative for dizziness.       Past Medical History  Diagnosis Date  . Asthma   . Depression   . Migraine headache   . IBS (irritable bowel syndrome)   . Hypothyroidism   . Myocarditis ([place]) 01/21/2010    hospitalized- [facility]  . Hemorrhoids   . HLD (hyperlipidemia)   . History of pneumonia 1969  . NSTEMI (non-ST elevated myocardial infarction), 01/2010 [facility] 09/19/2013    Secondary to viral myocarditis   . Diabetes mellitus     type II off medication    Social History   Social History  . Marital Status: Married    Spouse Name: Nicole Kindred  . Number of Children: 3  . Years of Education: N/A   Occupational History  . 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     Social History Main Topics  . Smoking status: Former Smoker -- 0.35 packs/day for 35 years   Types: Cigarettes    Quit date: 09/25/2008  . Smokeless tobacco: Never Used     Comment: quit in 2007-2008, social smoker  . Alcohol Use: Yes     Comment: rarely  . Drug Use: No  . Sexual Activity: Not on file   Other Topics Concern  . Not on file   Social History Narrative   No reg exercise    Past Surgical History  Procedure Laterality Date  . Appendectomy  1974  . Cholecystectomy  2002  . Total abdominal hysterectomy w/ bilateral salpingoophorectomy  2000    no ovaries, uterus, or cervix.  . Cardiac catheterization  8/11    no significant -[facility]- Dr Rockey Situ,  . Breast biopsy  1988  . Nephrolithotomy Right 08/20/2014    Procedure: RIGHT PERCUTANEOUS NEPHROLITHOTOMY ;  Surgeon: Malka So, MD;  Location: [facility];  Service: Urology;  Laterality: Right;  . Colonoscopy  2013    Family History  Problem Relation Age of Onset  . Breast cancer Paternal Grandmother   . Lung cancer Paternal Grandfather     CAD  . Coronary artery disease Mother   . Diabetes Mother   . ALS Mother   . Other Daughter     gluten  . Hyperlipidemia Father   . Down syndrome Son   . Colon cancer Maternal Grandfather   . Cancer Sister 60  breast    Allergies  Allergen Reactions  . Pseudoeph-Doxylamine-Dm-Apap Anaphylaxis    Allergy to nyquil  . Gluten Other (See Comments)    Gi upset  . Penicillins Itching and Rash    Current Outpatient Prescriptions on File Prior to Visit  Medication Sig Dispense Refill  . aspirin EC 81 MG tablet Take 81 mg by mouth daily.    . fenofibrate (TRICOR) 145 MG tablet Take 1 tablet (145 mg total) by mouth daily. (Patient taking differently: Take 145 mg by mouth every other day. Alternate with pravastatin) 90 tablet 3  . glucose blood (ACCU-CHEK AVIVA) test strip Use as instructed     . levothyroxine (SYNTHROID, LEVOTHROID) 50 MCG tablet Take 1 tablet (50 mcg total) by mouth daily. 90 tablet 3  . nitroGLYCERIN (NITROSTAT) 0.4 MG SL tablet 1 tablet under tongue  at onset of chest pain; you may repeat every 5 minutes for up to 3 doses. (Patient taking differently: Place 0.4 mg under the tongue every 5 (five) minutes as needed for chest pain. you may repeat every 5 minutes for up to 3 doses.) 25 tablet 12  . pravastatin (PRAVACHOL) 40 MG tablet Take 1 tablet (40 mg total) by mouth at bedtime. (Patient taking differently: Take 40 mg by mouth every other day. Alternate with fenofibrate) 90 tablet 3  . sertraline (ZOLOFT) 50 MG tablet Take one tablet daily. (Patient taking differently: Take 50 mg by mouth daily. ) 90 tablet 3  . topiramate (TOPAMAX) 50 MG tablet Take one tablet twice daily (Patient taking differently: Take 50 mg by mouth daily. ) 180 tablet 3   No current facility-administered medications on file prior to visit.    BP 128/72 mmHg  Pulse 69  Temp(Src) 98.2 F (36.8 C) (Oral)  Ht 5\' 4"  (1.626 m)  Wt 173 lb 6.4 oz (78.654 kg)  BMI 29.75 kg/m2  SpO2 96%    Objective:   Physical Exam  Constitutional: She is oriented to person, place, and time. She appears well-nourished.  Eyes: EOM are normal. Pupils are equal, round, and reactive to light.  Cardiovascular: Normal rate and regular rhythm.   Pulmonary/Chest: Effort normal and breath sounds normal.  Neurological: She is alert and oriented to person, place, and time. No cranial nerve deficit.  Skin: Skin is warm and dry.  Psychiatric: She has a normal mood and affect.          Assessment & Plan:  Migraine:  Present since Sunday during intercourse. Gradually improved since, although continues to be bothersome. Currently on daily Topamax at 50 mg. She and her husband are very concerned and would like a referral back to their neurologist. Exam unremarkable today.  IM Toradol provided for relief. RX for Zofran sent to pharmacy. Referral placed to neurology for further evaluation as this is not the first time this has occurred.  Return precautions provided.

## 2015-07-18 ENCOUNTER — Emergency Department: Payer: 59

## 2015-07-18 ENCOUNTER — Encounter: Payer: Self-pay | Admitting: Emergency Medicine

## 2015-07-18 DIAGNOSIS — R200 Anesthesia of skin: Secondary | ICD-10-CM | POA: Diagnosis not present

## 2015-07-18 DIAGNOSIS — R202 Paresthesia of skin: Secondary | ICD-10-CM | POA: Diagnosis not present

## 2015-07-18 DIAGNOSIS — R42 Dizziness and giddiness: Secondary | ICD-10-CM | POA: Diagnosis not present

## 2015-07-18 DIAGNOSIS — E119 Type 2 diabetes mellitus without complications: Secondary | ICD-10-CM | POA: Insufficient documentation

## 2015-07-18 DIAGNOSIS — H53149 Visual discomfort, unspecified: Secondary | ICD-10-CM | POA: Insufficient documentation

## 2015-07-18 DIAGNOSIS — Z79899 Other long term (current) drug therapy: Secondary | ICD-10-CM | POA: Diagnosis not present

## 2015-07-18 DIAGNOSIS — Z7982 Long term (current) use of aspirin: Secondary | ICD-10-CM | POA: Diagnosis not present

## 2015-07-18 DIAGNOSIS — R51 Headache: Secondary | ICD-10-CM | POA: Diagnosis present

## 2015-07-18 DIAGNOSIS — R110 Nausea: Secondary | ICD-10-CM | POA: Insufficient documentation

## 2015-07-18 DIAGNOSIS — Z880 Allergy status to penicillin: Secondary | ICD-10-CM | POA: Diagnosis not present

## 2015-07-18 DIAGNOSIS — Z87891 Personal history of nicotine dependence: Secondary | ICD-10-CM | POA: Insufficient documentation

## 2015-07-18 LAB — CBC WITH DIFFERENTIAL/PLATELET
BASOS ABS: 0 10*3/uL (ref 0–0.1)
Basophils Relative: 1 %
EOS PCT: 3 %
Eosinophils Absolute: 0.2 10*3/uL (ref 0–0.7)
HEMATOCRIT: 37.8 % (ref 35.0–47.0)
Hemoglobin: 12.8 g/dL (ref 12.0–16.0)
LYMPHS ABS: 2.5 10*3/uL (ref 1.0–3.6)
LYMPHS PCT: 34 %
MCH: 27 pg (ref 26.0–34.0)
MCHC: 33.9 g/dL (ref 32.0–36.0)
MCV: 79.7 fL — AB (ref 80.0–100.0)
MONO ABS: 0.6 10*3/uL (ref 0.2–0.9)
Monocytes Relative: 8 %
NEUTROS ABS: 4 10*3/uL (ref 1.4–6.5)
Neutrophils Relative %: 54 %
PLATELETS: 226 10*3/uL (ref 150–440)
RBC: 4.74 MIL/uL (ref 3.80–5.20)
RDW: 14 % (ref 11.5–14.5)
WBC: 7.3 10*3/uL (ref 3.6–11.0)

## 2015-07-18 IMAGING — CT CT HEAD W/O CM
1 series · 16 of 30 positions shown, 20 images · non-contrast
Comparison: [DATE]

CLINICAL DATA: Headache since last week, "feels like head in a
vice". Numbness in hands.

EXAM:
CT HEAD WITHOUT CONTRAST
TECHNIQUE: Contiguous axial images were obtained from the base of the skull
through the vertex without intravenous contrast.

[Series 2: head wo · axial · 0.47mm/px · z∈[-115,+11]mm · 16 of 32 slices shown, 20 images]
[im 2/32  brain]
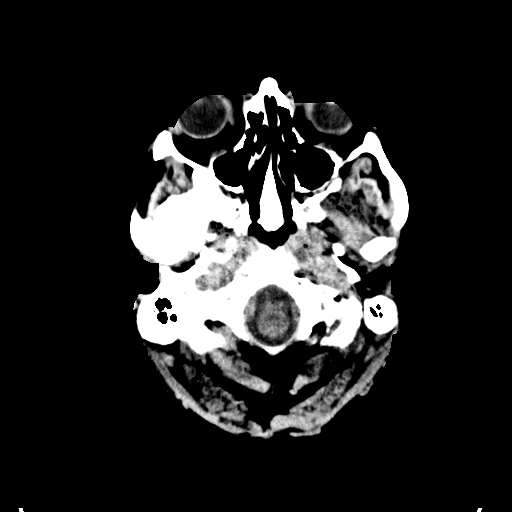
[im 2/32  bone]
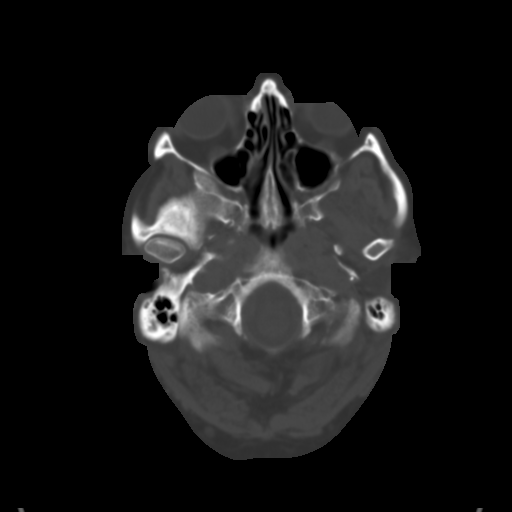
[im 4/32  brain]
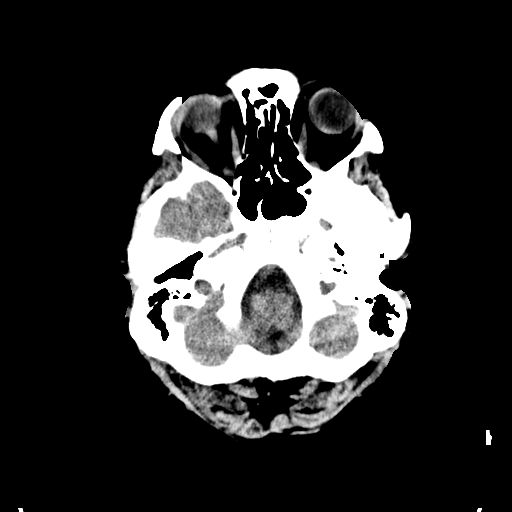
[im 6/32  brain]
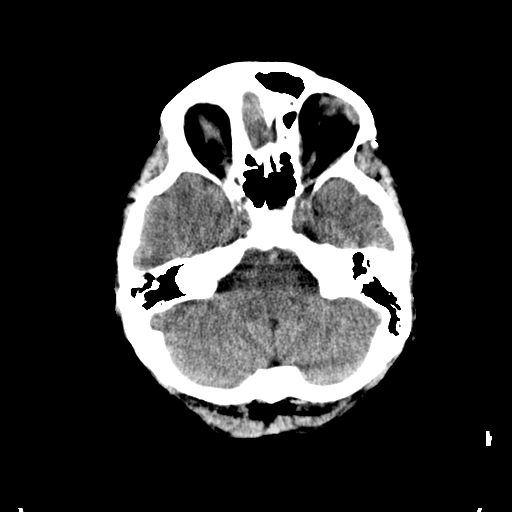
[im 8/32  brain]
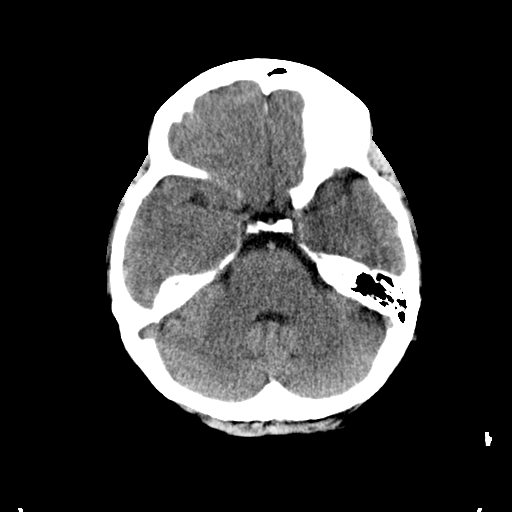
[im 9/32  brain]
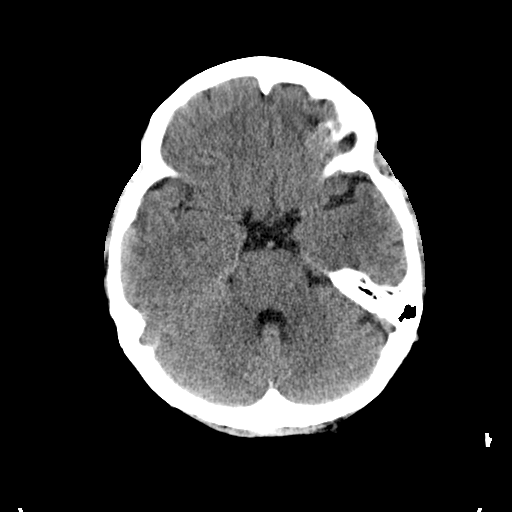
[im 9/32  bone]
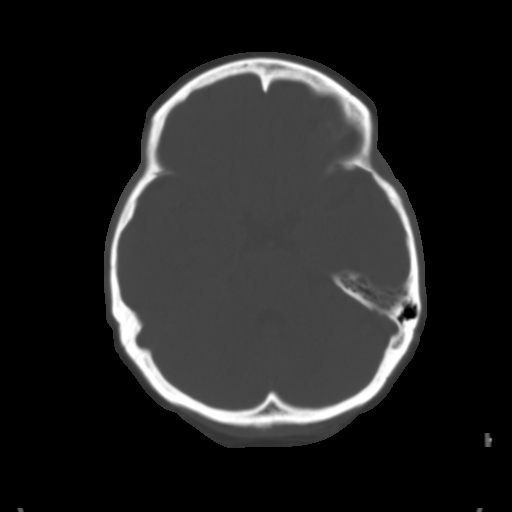
[im 11/32  brain]
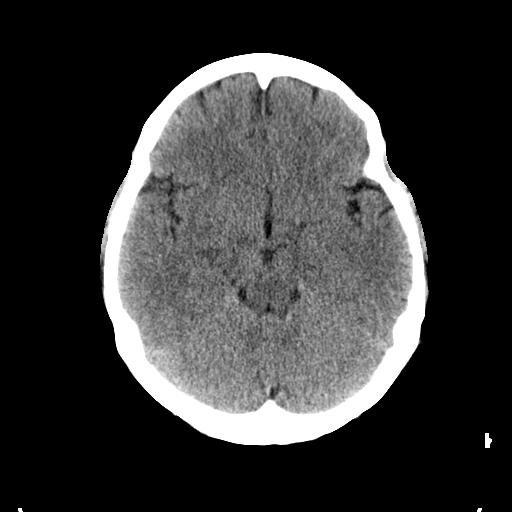
[im 13/32  brain]
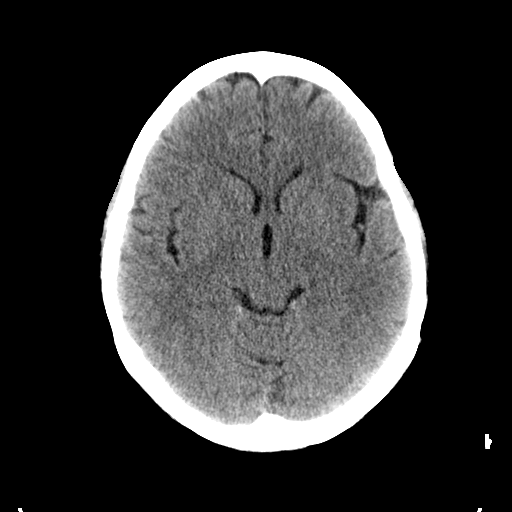
[im 15/32  brain]
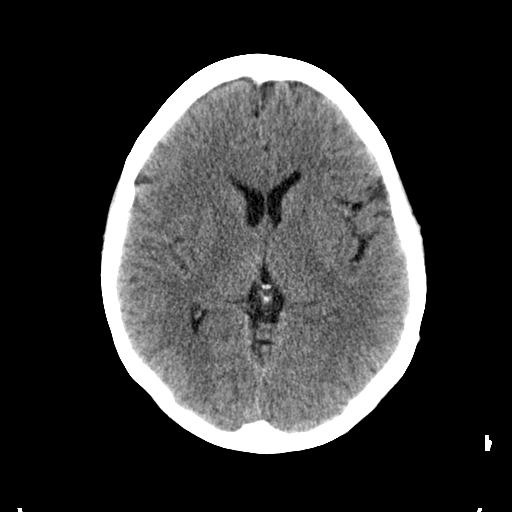
[im 17/32  brain]
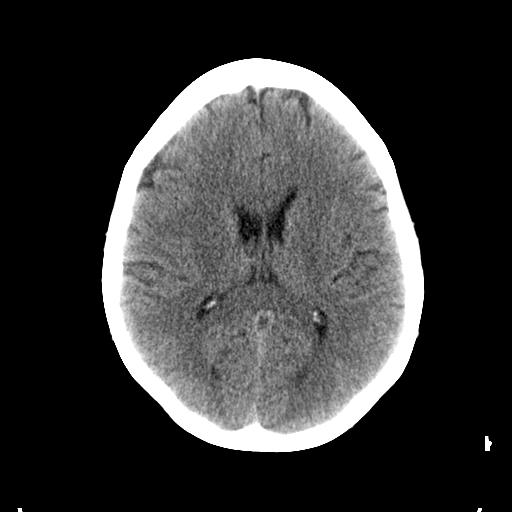
[im 17/32  bone]
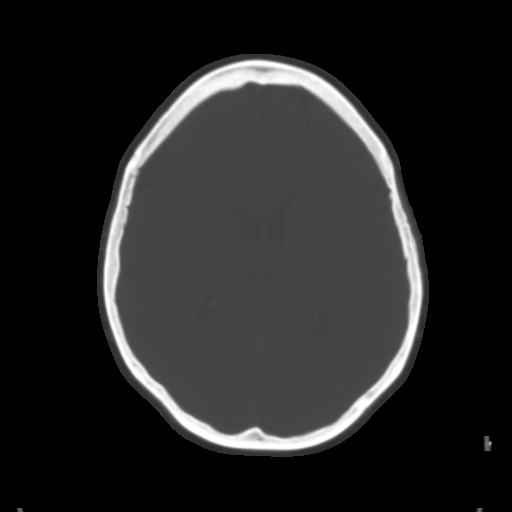
[im 19/32  brain]
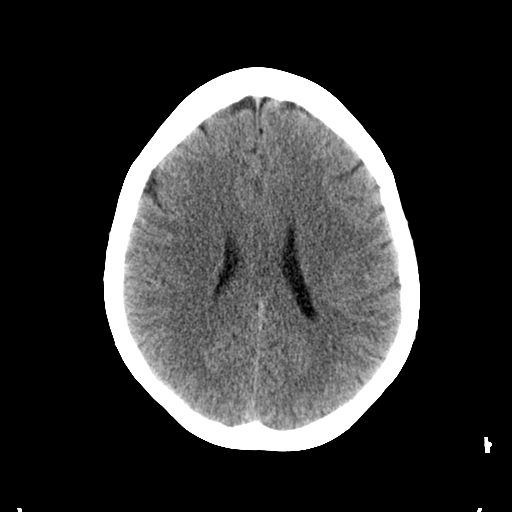
[im 21/32  brain]
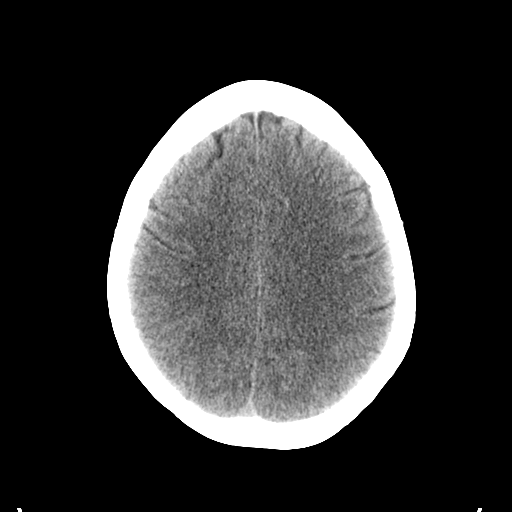
[im 23/32  brain]
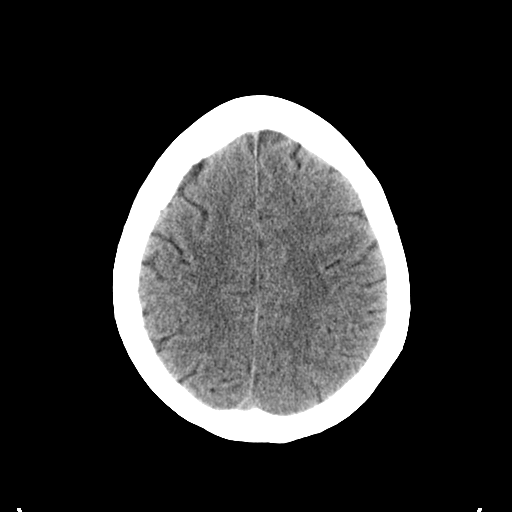
[im 24/32  brain]
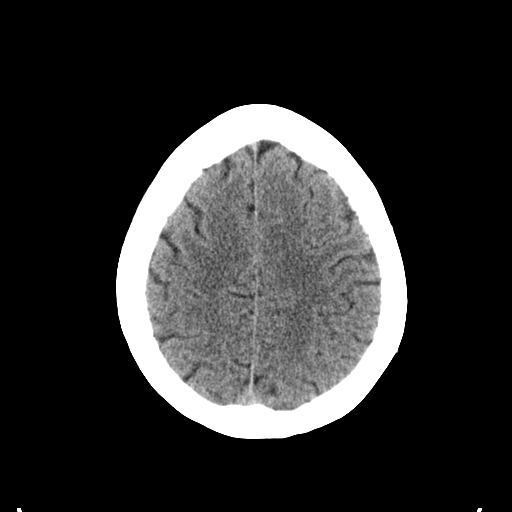
[im 24/32  bone]
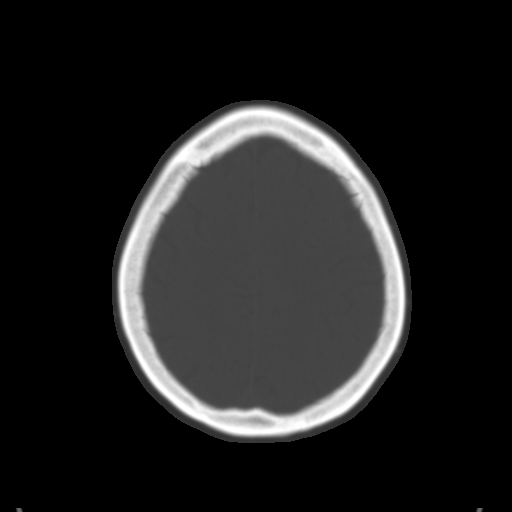
[im 26/32  brain]
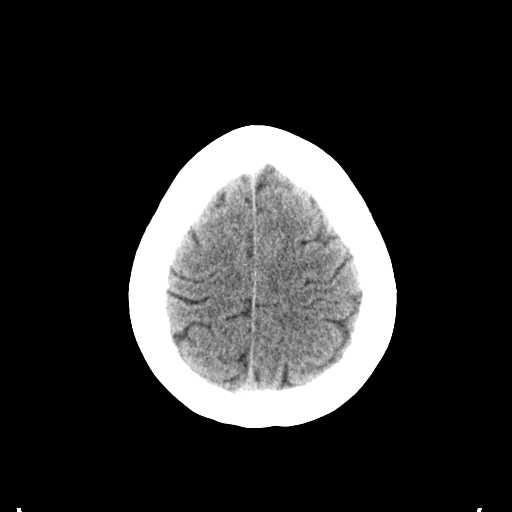
[im 28/32  brain]
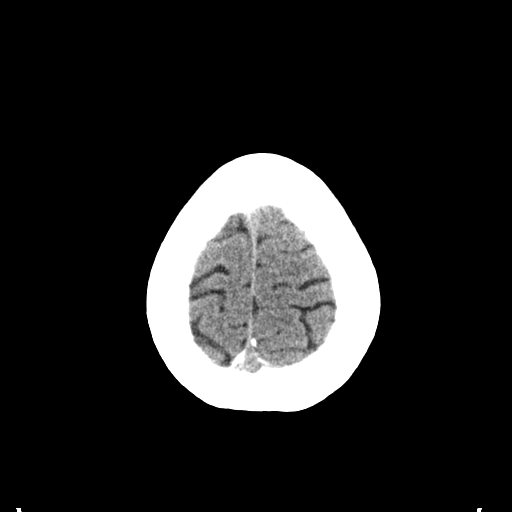
[im 30/32  brain]
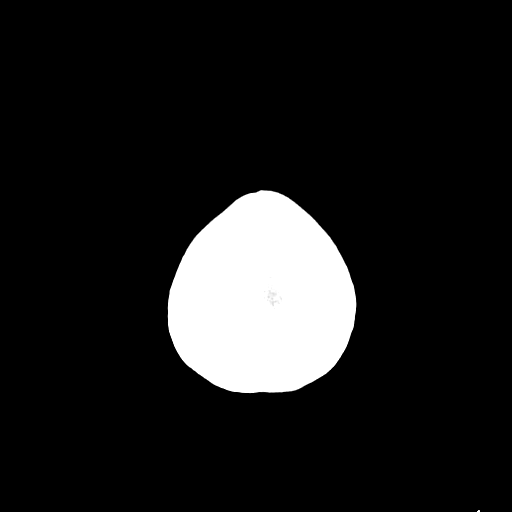

[16 of 30 positions shown; findings below may reference images not displayed]

FINDINGS: No intracranial hemorrhage, mass effect, or midline shift. No
hydrocephalus. The basilar cisterns are patent. No evidence of
territorial infarct. No intracranial fluid collection. Calvarium is
intact. Included paranasal sinuses and mastoid air cells are well
aerated.
IMPRESSION: No acute intracranial abnormality.

## 2015-07-18 NOTE — ED Notes (Signed)
Pt via POV with CO of 6/10 HA started this week, with numbness in hands, nausea, recent lack of energy, and dizziness.  Pt denies pain in chest, jaw or arms, NO vomiting, Sweating, or SOB.   Pt reports "cardiac event" approx 5 years ago, recent stress test was neg per Dr Candis Musa.  Pt appears calm and relaxed.

## 2015-07-19 ENCOUNTER — Emergency Department
Admission: EM | Admit: 2015-07-19 | Discharge: 2015-07-19 | Disposition: A | Discharge status: Home / Self Care | Payer: 59 | Attending: Emergency Medicine | Admitting: Emergency Medicine

## 2015-07-19 ENCOUNTER — Telehealth: Payer: Self-pay | Admitting: *Deleted

## 2015-07-19 DIAGNOSIS — R519 Headache, unspecified: Secondary | ICD-10-CM

## 2015-07-19 DIAGNOSIS — R51 Headache: Secondary | ICD-10-CM

## 2015-07-19 LAB — COMPREHENSIVE METABOLIC PANEL
ALK PHOS: 76 U/L (ref 38–126)
ALT: 39 U/L (ref 14–54)
ANION GAP: 5 (ref 5–15)
AST: 34 U/L (ref 15–41)
Albumin: 4.3 g/dL (ref 3.5–5.0)
BUN: 14 mg/dL (ref 6–20)
CHLORIDE: 110 mmol/L (ref 101–111)
CO2: 25 mmol/L (ref 22–32)
CREATININE: 0.72 mg/dL (ref 0.44–1.00)
Calcium: 8.9 mg/dL (ref 8.9–10.3)
GFR calc non Af Amer: >60 mL/min (ref >60)
GLUCOSE: 129 mg/dL — AB (ref 65–99)
Potassium: 4 mmol/L (ref 3.5–5.1)
SODIUM: 140 mmol/L (ref 135–145)
TOTAL PROTEIN: 7 g/dL (ref 6.5–8.1)
Total Bilirubin: 0.6 mg/dL (ref 0.3–1.2)

## 2015-07-19 LAB — TROPONIN I: Troponin I: <0.03 ng/mL (ref <0.031)

## 2015-07-19 MED ORDER — KETOROLAC TROMETHAMINE 30 MG/ML IJ SOLN
30.0000 mg | Freq: Once | INTRAMUSCULAR | Status: AC
  Administered 2015-07-19: 30 mg via INTRAVENOUS
  Filled 2015-07-19: qty 1

## 2015-07-19 MED ORDER — BUTALBITAL-APAP-CAFFEINE 50-325-40 MG PO TABS: 1.0000 | ORAL_TABLET | Freq: Four times a day (QID) | ORAL | Status: DC | PRN

## 2015-07-19 MED ORDER — METOCLOPRAMIDE HCL 5 MG/ML IJ SOLN
10.0000 mg | Freq: Once | INTRAMUSCULAR | Status: AC
Start: 2015-07-19 — End: 2015-07-19
  Administered 2015-07-19: 10 mg via INTRAVENOUS
  Filled 2015-07-19: qty 2

## 2015-07-19 MED ORDER — DIPHENHYDRAMINE HCL 50 MG/ML IJ SOLN
25.0000 mg | Freq: Once | INTRAMUSCULAR | Status: AC
  Administered 2015-07-19: 25 mg via INTRAVENOUS
  Filled 2015-07-19: qty 1

## 2015-07-19 MED ORDER — FENOFIBRATE 160 MG PO TABS: 160.0000 mg | ORAL_TABLET | Freq: Every day | ORAL | Status: DC

## 2015-07-19 NOTE — ED Notes (Signed)
Pt uprite on stretcher in exam room with no distress noted; reports frontal and left sided HA x 3 wks accomp by nausea and dizzinesswith hx of same; st normally takes topamax daily; pt A&Ox3, PERRL, MAEW

## 2015-07-19 NOTE — Discharge Instructions (Signed)
Migraine Headache A migraine headache is an intense, throbbing pain on one or both sides of your head. A migraine can last for 30 minutes to several hours. CAUSES  The exact cause of a migraine headache is not always known. However, a migraine may be caused when nerves in the brain become irritated and release chemicals that cause inflammation. This causes pain. Certain things may also trigger migraines, such as:  Alcohol.  Smoking.  Stress.  Menstruation.  Aged cheeses.  Foods or drinks that contain nitrates, glutamate, aspartame, or tyramine.  Lack of sleep.  Chocolate.  Caffeine.  Hunger.  Physical exertion.  Fatigue.  Medicines used to treat chest pain (nitroglycerine), birth control pills, estrogen, and some blood pressure medicines. SIGNS AND SYMPTOMS  Pain on one or both sides of your head.  Pulsating or throbbing pain.  Severe pain that prevents daily activities.  Pain that is aggravated by any physical activity.  Nausea, vomiting, or both.  Dizziness.  Pain with exposure to bright lights, loud noises, or activity.  General sensitivity to bright lights, loud noises, or smells. Before you get a migraine, you may get warning signs that a migraine is coming (aura). An aura may include:  Seeing flashing lights.  Seeing bright spots, halos, or zigzag lines.  Having tunnel vision or blurred vision.  Having feelings of numbness or tingling.  Having trouble talking.  Having muscle weakness. DIAGNOSIS  A migraine headache is often diagnosed based on:  Symptoms.  Physical exam.  A CT scan or MRI of your head. These imaging tests cannot diagnose migraines, but they can help rule out other causes of headaches. TREATMENT Medicines may be given for pain and nausea. Medicines can also be given to help prevent recurrent migraines.  HOME CARE INSTRUCTIONS  Only take over-the-counter or prescription medicines for pain or discomfort as directed by your  health care provider. The use of long-term narcotics is not recommended.  Lie down in a dark, quiet room when you have a migraine.  Keep a journal to find out what may trigger your migraine headaches. For example, write down:  What you eat and drink.  How much sleep you get.  Any change to your diet or medicines.  Limit alcohol consumption.  Quit smoking if you smoke.  Get 7-9 hours of sleep, or as recommended by your health care provider.  Limit stress.  Keep lights dim if bright lights bother you and make your migraines worse. SEEK IMMEDIATE MEDICAL CARE IF:   Your migraine becomes severe.  You have a fever.  You have a stiff neck.  You have vision loss.  You have muscular weakness or loss of muscle control.  You start losing your balance or have trouble walking.  You feel faint or pass out.  You have severe symptoms that are different from your first symptoms. MAKE SURE YOU:   Understand these instructions.  Will watch your condition.  Will get help right away if you are not doing well or get worse.   This information is not intended to replace advice given to you by your health care provider. Make sure you discuss any questions you have with your health care provider.   Document Released: 06/05/2005 Document Revised: 06/26/2014 Document Reviewed: 02/10/2013 Elsevier Interactive Patient Education 2016 [place] Headache Without Cause A headache is pain or discomfort felt around the head or neck area. There are many causes and types of headaches. In some cases, the cause may not be found.  HOME CARE  Managing Pain  Take over-the-counter and prescription medicines only as told by your doctor.  Lie down in a dark, quiet room when you have a headache.  If directed, apply ice to the head and neck area:  Put ice in a plastic bag.  Place a towel between your skin and the bag.  Leave the ice on for 20 minutes, 2-3 times per day.  Use a  heating pad or hot shower to apply heat to the head and neck area as told by your doctor.  Keep lights dim if bright lights bother you or make your headaches worse. Eating and Drinking  Eat meals on a regular schedule.  Lessen how much alcohol you drink.  Lessen how much caffeine you drink, or stop drinking caffeine. General Instructions  Keep all follow-up visits as told by your doctor. This is important.  Keep a journal to find out if certain things bring on headaches. For example, write down:  What you eat and drink.  How much sleep you get.  Any change to your diet or medicines.  Relax by getting a massage or doing other relaxing activities.  Lessen stress.  Sit up straight. Do not tighten (tense) your muscles.  Do not use tobacco products. This includes cigarettes, chewing tobacco, or e-cigarettes. If you need help quitting, ask your doctor.  Exercise regularly as told by your doctor.  Get enough sleep. This often means 7-9 hours of sleep. GET HELP IF:  Your symptoms are not helped by medicine.  You have a headache that feels different than the other headaches.  You feel sick to your stomach (nauseous) or you throw up (vomit).  You have a fever. GET HELP RIGHT AWAY IF:   Your headache becomes really bad.  You keep throwing up.  You have a stiff neck.  You have trouble seeing.  You have trouble speaking.  You have pain in the eye or ear.  Your muscles are weak or you lose muscle control.  You lose your balance or have trouble walking.  You feel like you will pass out (faint) or you pass out.  You have confusion.   This information is not intended to replace advice given to you by your health care provider. Make sure you discuss any questions you have with your health care provider.   Document Released: 03/14/2008 Document Revised: 02/24/2015 Document Reviewed: 09/28/2014 Elsevier Interactive Patient Education [employer].

## 2015-07-19 NOTE — Telephone Encounter (Signed)
Received fax from [facility] stating patients insurance will only cover fenofibrate 54 mg or 160 mg.  Per Dr. Lorelei Pont, okay to change to Fenofibrate 160 mg.  New prescription sent to [facility].  Ms. Wayson notified.

## 2015-07-19 NOTE — ED Provider Notes (Signed)
[facility] Emergency Department Provider Note  ____________________________________________  Time seen: Approximately 150 AM  I have reviewed the triage vital signs and the nursing notes.   HISTORY  Chief Complaint Headache    HPI Brandi Mahoney is a 60 y.o. female who comes into the hospital today with a headache. The patient reports that this started 3 weeks ago suddenly. The headache started after sexual intercourse. The patient reports that she is treated for migraines and typically does not get them. She went to her regular doctor's office who initially plan to do a CT scan but then told her she needed to see neurology. They have an appointment scheduled but it is not until March. She reports that this headache seems different from her previous headaches. She was told that if it did not get any better she should come into the ER. She reports that she did feel some numbness and tingling in her toes and in her anger's that has come and gone. She also feels occasional dizziness. She reports that she simply doesn't feel right. She has had some nausea and some sensitivity to light. She does take Topamax and she reports that she has not had a severe migraine in a long time. She reports that she typically has a low-grade headache with which she can function but its never been this intense before. She reports that when the headache started she felt as though she couldn't talk and it was severe for 15 minutes and that subsided and has been coming and going since then. The patient reports that it hurts more on the left side of her head. She rates her pain as a 6 out of 10 in intensity. She has not been taking anything else for her headache at home.   Past Medical History  Diagnosis Date  . Asthma   . Depression   . Migraine headache   . IBS (irritable bowel syndrome)   . Hypothyroidism   . Myocarditis ([place]) 01/21/2010    hospitalized- [facility]  . Hemorrhoids   . HLD  (hyperlipidemia)   . History of pneumonia 1969  . NSTEMI (non-ST elevated myocardial infarction), 01/2010 [facility] 09/19/2013    Secondary to viral myocarditis   . Diabetes mellitus     type II off medication    Patient Active Problem List   Diagnosis Date Noted  . Viral URI with cough 04/02/2015  . Chest wall mass 12/18/2014  . Chest wall pain 11/20/2014  . Postoperative anemia due to acute blood loss 08/25/2014  . Renal stone   . Abdominal pain, epigastric 08/06/2014  . Obstructive sleep apnea 09/25/2013  . 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 NSTEMI (non-ST elevated myocardial infarction), 01/2010 [facility] 09/19/2013  . IBS (irritable bowel syndrome) 09/26/2012  . PARESTHESIA, HANDS 06/01/2010  . Hyperlipemia 04/01/2010  . 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 HYPERTRIGLYCERIDEMIA 07/21/2009  . OTHER MALAISE AND FATIGUE 09/09/2008  . Hypothyroidism 09/08/2008  . Major depressive disorder, recurrent episode, in full remission ([place]) 09/08/2008  . ASTHMA 09/08/2008    Past Surgical History  Procedure Laterality Date  . Appendectomy  1974  . Cholecystectomy  2002  . Total abdominal hysterectomy w/ bilateral salpingoophorectomy  2000    no ovaries, uterus, or cervix.  . Cardiac catheterization  8/11    no significant -[facility]- Dr Rockey Situ,  . Breast biopsy  1988  . Nephrolithotomy Right 08/20/2014    Procedure: RIGHT PERCUTANEOUS NEPHROLITHOTOMY ;  Surgeon: Malka So, MD;  Location:  [facility];  Service: Urology;  Laterality: Right;  . Colonoscopy  2013    Current Outpatient Rx  Name  Route  Sig  Dispense  Refill  . aspirin EC 81 MG tablet   Oral   Take 81 mg by mouth daily.         . butalbital-acetaminophen-caffeine (FIORICET) 50-325-40 MG tablet   Oral   Take 1-2 tablets by mouth every 6 (six) hours as needed for headache.   20 tablet   0   . fenofibrate (TRICOR) 145 MG tablet   Oral   Take 1 tablet (145 mg total) by mouth daily. Patient taking differently: Take  145 mg by mouth every other day. Alternate with pravastatin   90 tablet   3   . glucose blood (ACCU-CHEK AVIVA) test strip      Use as instructed          . levothyroxine (SYNTHROID, LEVOTHROID) 50 MCG tablet   Oral   Take 1 tablet (50 mcg total) by mouth daily.   90 tablet   3   . nitroGLYCERIN (NITROSTAT) 0.4 MG SL tablet      1 tablet under tongue at onset of chest pain; you may repeat every 5 minutes for up to 3 doses. Patient taking differently: Place 0.4 mg under the tongue every 5 (five) minutes as needed for chest pain. you may repeat every 5 minutes for up to 3 doses.   25 tablet   12   . ondansetron (ZOFRAN ODT) 4 MG disintegrating tablet   Oral   Take 1 tablet (4 mg total) by mouth every 8 (eight) hours as needed for nausea or vomiting.   20 tablet   0   . pravastatin (PRAVACHOL) 40 MG tablet   Oral   Take 1 tablet (40 mg total) by mouth at bedtime. Patient taking differently: Take 40 mg by mouth every other day. Alternate with fenofibrate   90 tablet   3   . sertraline (ZOLOFT) 50 MG tablet      Take one tablet daily. Patient taking differently: Take 50 mg by mouth daily.    90 tablet   3   . topiramate (TOPAMAX) 50 MG tablet      Take one tablet twice daily Patient taking differently: Take 50 mg by mouth daily.    180 tablet   3     Allergies Pseudoeph-doxylamine-dm-apap; Gluten; and Penicillins  Family History  Problem Relation Age of Onset  . Breast cancer Paternal Grandmother   . Lung cancer Paternal Grandfather     CAD  . Coronary artery disease Mother   . Diabetes Mother   . ALS Mother   . Other Daughter     gluten  . Hyperlipidemia Father   . Down syndrome Son   . Colon cancer Maternal Grandfather   . Cancer Sister 20    breast    Social History Social History  Substance Use Topics  . Smoking status: Former Smoker -- 0.35 packs/day for 35 years    Types: Cigarettes    Quit date: 09/25/2008  . Smokeless tobacco: Never  Used     Comment: quit in 2007-2008, social smoker  . Alcohol Use: Yes     Comment: rarely    Review of Systems Constitutional: No fever/chills Eyes: No visual changes. ENT: No sore throat. Cardiovascular: Denies chest pain. Respiratory: Denies shortness of breath. Gastrointestinal: Nausea with no abdominal pain Genitourinary: Negative for dysuria. Musculoskeletal: Negative for back pain. Skin:  Negative for rash. Neurological: Headache and dizziness, numbness and tingling to hands and feet  10-point ROS otherwise negative.  ____________________________________________   PHYSICAL EXAM:  VITAL SIGNS: ED Triage Vitals  Enc Vitals Group     BP 07/18/15 2321 145/76 mmHg     Pulse Rate 07/18/15 2321 85     Resp 07/18/15 2321 18     Temp 07/18/15 2321 98 F (36.7 C)     Temp Source 07/18/15 2321 Oral     SpO2 07/18/15 2321 95 %     Weight 07/18/15 2321 170 lb (77.111 kg)     Height 07/18/15 2321 5\' 4"  (1.626 m)     Head Cir --      Peak Flow --      Pain Score 07/18/15 2322 6     Pain Loc --      Pain Edu? --      Excl. in [place]? --     Constitutional: Alert and oriented. Well appearing and in moderate distress. Eyes: Conjunctivae are normal. PERRL. EOMI. Head: Atraumatic. Nose: No congestion/rhinnorhea. Mouth/Throat: Mucous membranes are moist.  Oropharynx non-erythematous. Cardiovascular: Normal rate, regular rhythm. Grossly normal heart sounds.  Good peripheral circulation. Respiratory: Normal respiratory effort.  No retractions. Lungs CTAB. Gastrointestinal: Soft and nontender. No distention. Positive bowel sounds Musculoskeletal: No lower extremity tenderness nor edema.   Neurologic:  Normal speech and language. Cranial nerves II through XII are grossly intact Skin:  Skin is warm, dry and intact.  Psychiatric: Mood and affect are normal.   ____________________________________________   LABS (all labs ordered are listed, but only abnormal results are  displayed)  Labs Reviewed  CBC WITH DIFFERENTIAL/PLATELET - Abnormal; Notable for the following:    MCV 79.7 (*)    All other components within normal limits  COMPREHENSIVE METABOLIC PANEL - Abnormal; Notable for the following:    Glucose, Bld 129 (*)    All other components within normal limits  TROPONIN I   ____________________________________________  EKG  ED ECG REPORT I, Loney Hering, the attending physician, personally viewed and interpreted this ECG.   Date: 07/18/2015  EKG Time: 2316  Rate: 74  Rhythm: normal sinus rhythm  Axis: normal  Intervals:incomplete right bundle branch block  ST&T Change: none  ____________________________________________  RADIOLOGY  CT head: No acute intracranial abnormality  ____________________________________________   PROCEDURES  Procedure(s) performed: None  Critical Care performed: No  ____________________________________________   INITIAL IMPRESSION / ASSESSMENT AND PLAN / ED COURSE  Pertinent labs & imaging results that were available during my care of the patient were reviewed by me and considered in my medical decision making (see chart for details).  This is a 60 yo female who comes into the hospital today with headache for the last 3 weeks. The patient will receive some reglan and benadryl as well as toradol. I will reassess the patient when she's received her medication to determine if her pain is improved. The patient may have had a post stress-induced headache which was the initial cause of her headache which has lingered due to the patient's history of migraines.  After the medication the patient reports that her headache is back to a 2 out of 10 in intensity which is what she reports her daily headache to be added. The patient feels better and is ready to be discharged home. I will give the patient a prescription for Fioricet in case she has any worsening symptoms. The patient should follow-up with neurology  as  discussed previously. ____________________________________________   FINAL CLINICAL IMPRESSION(S) / ED DIAGNOSES  Final diagnoses:  Headache, unspecified headache type      Loney Hering, MD 07/19/15 [phone]

## 2015-07-20 NOTE — Telephone Encounter (Signed)
Received fax today requesting PA for fenofibrate 160 mg.  Called [facility] to see what was going on.  Fenofibrate 145 mg PA was done in 05/2015 and was approved until 05/2016.  [facility] could not find any reason why to 145 mg was not going through since it was approved until 05/2016.  They are going to forward this problem to a different department to have them look in to what is going on. They advised me to have the pharmacy try and run the Fenofibrate 145 mg prescription through again in about 24 hours.  Kristopher Oppenheim pharmacist notified of the issue at [facility] and was advised  to cancel the 160 mg prescription I sent in yesterday and try to run the 145 mg tablets back through [facility] tomorrow.  Medication list updated.

## 2015-07-20 NOTE — Addendum Note (Signed)
Addended by: Carter Kitten on: 07/20/2015 02:51 PM   Modules accepted: Orders, Medications

## 2015-08-11 ENCOUNTER — Encounter: Payer: Self-pay | Admitting: Primary Care

## 2015-08-11 ENCOUNTER — Ambulatory Visit (INDEPENDENT_AMBULATORY_CARE_PROVIDER_SITE_OTHER): Payer: 59 | Admitting: Primary Care

## 2015-08-11 VITALS — BP 118/84 | HR 88 | Temp 98.1°F | Ht 64.0 in | Wt 171.0 lb

## 2015-08-11 DIAGNOSIS — R05 Cough: Secondary | ICD-10-CM | POA: Diagnosis not present

## 2015-08-11 DIAGNOSIS — R059 Cough, unspecified: Secondary | ICD-10-CM

## 2015-08-11 LAB — POCT INFLUENZA A/B
INFLUENZA B, POC: NEGATIVE
Influenza A, POC: NEGATIVE

## 2015-08-11 NOTE — Addendum Note (Signed)
Addended by: Jacqualin Combes on: 08/11/2015 01:42 PM   Modules accepted: Miquel Dunn

## 2015-08-11 NOTE — Addendum Note (Signed)
Addended by: Jacqualin Combes on: 08/11/2015 02:16 PM   Modules accepted: Orders, SmartSet

## 2015-08-11 NOTE — Progress Notes (Signed)
Subjective:    Patient ID: Marzetta Merino, female    DOB: 08-Jun-1956, 60 y.o.   MRN: [id number]  HPI  Ms. Marmolejos is a 60 year old female who presents today with a chief complaint of fever. She also reports cough, sore throat, body aches, headache, nausea, ear pain. Her symptoms have been present since Saturday this past weekend. Her symptoms hit suddenly. Denies sick contacts. She had the flu shot late last year. Her cough is non productive. Her last fever was last night of 101. She's been taking sudafed-d, tylenol, ibuprofen, RX cough medication with codeine, robitussin. Her most bothersome symptom is congestion and cough.   Review of Systems  Constitutional: Positive for fever, chills and fatigue.  HENT: Positive for congestion, ear pain, rhinorrhea and sinus pressure.   Respiratory: Positive for cough. Negative for shortness of breath.   Cardiovascular: Negative for chest pain.  Musculoskeletal: Positive for myalgias.       Past Medical History  Diagnosis Date  . Asthma   . Depression   . Migraine headache   . IBS (irritable bowel syndrome)   . Hypothyroidism   . Myocarditis ([place]) 01/21/2010    hospitalized- [facility]  . Hemorrhoids   . HLD (hyperlipidemia)   . History of pneumonia 1969  . NSTEMI (non-ST elevated myocardial infarction), 01/2010 [facility] 09/19/2013    Secondary to viral myocarditis   . Diabetes mellitus     type II off medication    Social History   Social History  . Marital Status: Married    Spouse Name: Nicole Kindred  . Number of Children: 3  . Years of Education: N/A   Occupational History  . 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     Social History Main Topics  . Smoking status: Former Smoker -- 0.35 packs/day for 35 years    Types: Cigarettes    Quit date: 09/25/2008  . Smokeless tobacco: Never Used     Comment: quit in 2007-2008, social smoker  . Alcohol Use: Yes     Comment: rarely  . Drug Use: No  . Sexual Activity: Not on  file   Other Topics Concern  . Not on file   Social History Narrative   No reg exercise    Past Surgical History  Procedure Laterality Date  . Appendectomy  1974  . Cholecystectomy  2002  . Total abdominal hysterectomy w/ bilateral salpingoophorectomy  2000    no ovaries, uterus, or cervix.  . Cardiac catheterization  8/11    no significant -[facility]- Dr Rockey Situ,  . Breast biopsy  1988  . Nephrolithotomy Right 08/20/2014    Procedure: RIGHT PERCUTANEOUS NEPHROLITHOTOMY ;  Surgeon: Malka So, MD;  Location: [facility];  Service: Urology;  Laterality: Right;  . Colonoscopy  2013    Family History  Problem Relation Age of Onset  . Breast cancer Paternal Grandmother   . Lung cancer Paternal Grandfather     CAD  . Coronary artery disease Mother   . Diabetes Mother   . ALS Mother   . Other Daughter     gluten  . Hyperlipidemia Father   . Down syndrome Son   . Colon cancer Maternal Grandfather   . Cancer Sister 3    breast    Allergies  Allergen Reactions  . Pseudoeph-Doxylamine-Dm-Apap Anaphylaxis    Allergy to nyquil  . Gluten Other (See Comments)    Gi upset  . Penicillins Itching and  Rash    Current Outpatient Prescriptions on File Prior to Visit  Medication Sig Dispense Refill  . aspirin EC 81 MG tablet Take 81 mg by mouth daily.    . butalbital-acetaminophen-caffeine (FIORICET) 50-325-40 MG tablet Take 1-2 tablets by mouth every 6 (six) hours as needed for headache. 20 tablet 0  . fenofibrate (TRICOR) 145 MG tablet Take 145 mg by mouth daily.    Marland Kitchen glucose blood (ACCU-CHEK AVIVA) test strip Use as instructed     . levothyroxine (SYNTHROID, LEVOTHROID) 50 MCG tablet Take 1 tablet (50 mcg total) by mouth daily. 90 tablet 3  . nitroGLYCERIN (NITROSTAT) 0.4 MG SL tablet 1 tablet under tongue at onset of chest pain; you may repeat every 5 minutes for up to 3 doses. (Patient taking differently: Place 0.4 mg under the tongue every 5 (five) minutes as needed for chest pain.  you may repeat every 5 minutes for up to 3 doses.) 25 tablet 12  . ondansetron (ZOFRAN ODT) 4 MG disintegrating tablet Take 1 tablet (4 mg total) by mouth every 8 (eight) hours as needed for nausea or vomiting. 20 tablet 0  . pravastatin (PRAVACHOL) 40 MG tablet Take 1 tablet (40 mg total) by mouth at bedtime. (Patient taking differently: Take 40 mg by mouth every other day. Alternate with fenofibrate) 90 tablet 3  . sertraline (ZOLOFT) 50 MG tablet Take one tablet daily. (Patient taking differently: Take 50 mg by mouth daily. ) 90 tablet 3  . topiramate (TOPAMAX) 50 MG tablet Take one tablet twice daily (Patient taking differently: Take 50 mg by mouth daily. ) 180 tablet 3   No current facility-administered medications on file prior to visit.    BP 118/84 mmHg  Pulse 88  Temp(Src) 98.1 F (36.7 C) (Oral)  Ht 5\' 4"  (1.626 m)  Wt 171 lb (77.565 kg)  BMI 29.34 kg/m2  SpO2 97%    Objective:   Physical Exam  Constitutional: She appears well-nourished. She appears ill.  HENT:  Right Ear: Tympanic membrane and ear canal normal.  Left Ear: Ear canal normal. Tympanic membrane is bulging. Tympanic membrane is not erythematous.  Nose: Mucosal edema present. Right sinus exhibits maxillary sinus tenderness. Right sinus exhibits no frontal sinus tenderness. Left sinus exhibits maxillary sinus tenderness. Left sinus exhibits no frontal sinus tenderness.  Mouth/Throat: Oropharynx is clear and moist.  Eyes: Conjunctivae are normal.  Neck: Neck supple.  Cardiovascular: Normal rate and regular rhythm.   Pulmonary/Chest: Effort normal and breath sounds normal. She has no wheezes. She has no rales.  Lymphadenopathy:    She has no cervical adenopathy.  Skin: Skin is warm and dry.          Assessment & Plan:  URI:  Cough, fevers, chills, congestion x 4 days. Exam with clear lungs and mostly unremarkable HENT exam. Rapid Flu: Negative. Suspect this is viral in nature and will treat with  supportive measures. Flonase, Robitussin, tylenol or ibuprofen, fluids, rest. Return precautions provided.

## 2015-08-11 NOTE — Patient Instructions (Signed)
Your symptoms are related to a viral infection. Your flu test was negative.  Nasal Congestion: Try using Flonase (fluticasone) nasal spray. Instill 2 sprays in each nostril once daily.   Body Aches/Fevers: Tylenol or Ibuprofen. Do not exceed 3000 mg of tylenol or 2400 mg of ibuprofen in 24 hours.  Continue cough medication as discussed.  Your symptoms should improve over the next 4-5 days, sometimes this can linger up to 14 days.  Please notify me if you develop persistent fevers of 101, start coughing up green mucous, notice increased fatigue or weakness, or feel worse after 1 week of onset of symptoms.   Increase consumption of water intake and rest.  It was a pleasure to see you today!

## 2015-08-11 NOTE — Progress Notes (Signed)
Pre visit review using our clinic review tool, if applicable. No additional management support is needed unless otherwise documented below in the visit note. 

## 2015-08-17 ENCOUNTER — Telehealth: Payer: Self-pay | Admitting: Family Medicine

## 2015-08-17 NOTE — Telephone Encounter (Signed)
Ok to refill 180, 0 ref  Schedule f/u CPX

## 2015-08-17 NOTE — Telephone Encounter (Signed)
Spoke with pt she stated she would call back to schedule appointments

## 2015-08-17 NOTE — Telephone Encounter (Signed)
Last office visit 08/11/2015 with Gentry Fitz.  Last CPE 03/17/2013.  Last refilled 08/10/2014 for #180 with 3 refills.  Refill?

## 2015-08-17 NOTE — Telephone Encounter (Signed)
Please call and schedule CPE with fasting labs prior for Dr. Copland.  

## 2015-08-26 ENCOUNTER — Telehealth: Payer: Self-pay | Admitting: Family Medicine

## 2015-08-26 NOTE — Telephone Encounter (Signed)
Last office visit 02.22.2017 with Gentry Fitz for cough.  Last Lipid 08/06/2014.  No future appointments.  Refill?

## 2015-08-26 NOTE — Telephone Encounter (Signed)
Ok to refill 30, 3 ref.  F/u cpx in summer

## 2015-08-26 NOTE — Telephone Encounter (Signed)
Please call and schedule CPE with fasting labs for Dr. Lorelei Pont in Summer.

## 2015-09-02 NOTE — Telephone Encounter (Signed)
Left message asking pt to call office Dr copland next cpx 6/7

## 2015-09-02 NOTE — Telephone Encounter (Signed)
Pt called back. Informed her we needed to make cpe in summer 2017. Pt wanted to call back when she checks her schedule.

## 2015-09-08 ENCOUNTER — Telehealth: Payer: Self-pay | Admitting: Family Medicine

## 2015-09-08 NOTE — Telephone Encounter (Signed)
Please call and schedule CPE with fasting labs prior for Dr. Copland.  

## 2015-09-23 ENCOUNTER — Encounter: Payer: Self-pay | Admitting: Internal Medicine

## 2015-09-28 NOTE — Telephone Encounter (Signed)
Left message asking pt to call office  °

## 2015-10-01 ENCOUNTER — Ambulatory Visit
Admission: RE | Admit: 2015-10-01 | Discharge: 2015-10-01 | Disposition: A | Discharge status: Home / Self Care | Payer: 59 | Source: Ambulatory Visit | Attending: Unknown Physician Specialty | Admitting: Unknown Physician Specialty

## 2015-10-01 ENCOUNTER — Other Ambulatory Visit: Payer: Self-pay | Admitting: Unknown Physician Specialty

## 2015-10-01 DIAGNOSIS — Z1231 Encounter for screening mammogram for malignant neoplasm of breast: Secondary | ICD-10-CM

## 2015-10-05 ENCOUNTER — Inpatient Hospital Stay
Admission: RE | Admit: 2015-10-05 | Discharge: 2015-10-05 | Disposition: A | Discharge status: Home / Self Care | Payer: Self-pay | Source: Ambulatory Visit | Attending: *Deleted | Admitting: *Deleted

## 2015-10-05 ENCOUNTER — Other Ambulatory Visit: Payer: Self-pay | Admitting: *Deleted

## 2015-10-05 ENCOUNTER — Other Ambulatory Visit: Payer: Self-pay | Admitting: Unknown Physician Specialty

## 2015-10-05 DIAGNOSIS — Z9289 Personal history of other medical treatment: Secondary | ICD-10-CM

## 2015-10-05 DIAGNOSIS — N644 Mastodynia: Secondary | ICD-10-CM

## 2015-10-08 ENCOUNTER — Other Ambulatory Visit: Payer: 59

## 2015-10-08 ENCOUNTER — Ambulatory Visit: Payer: 59

## 2015-10-13 NOTE — Telephone Encounter (Signed)
Left message asking pt to call office  °

## 2015-10-14 ENCOUNTER — Other Ambulatory Visit: Payer: 59

## 2015-10-14 ENCOUNTER — Encounter: Payer: Self-pay | Admitting: Family Medicine

## 2015-10-14 ENCOUNTER — Ambulatory Visit: Payer: 59

## 2015-10-14 NOTE — Telephone Encounter (Signed)
Mailed letter °Please close °

## 2015-10-15 ENCOUNTER — Ambulatory Visit
Admission: RE | Admit: 2015-10-15 | Discharge: 2015-10-15 | Disposition: A | Discharge status: Home / Self Care | Payer: 59 | Source: Ambulatory Visit | Attending: Unknown Physician Specialty | Admitting: Unknown Physician Specialty

## 2015-10-15 DIAGNOSIS — N644 Mastodynia: Secondary | ICD-10-CM | POA: Insufficient documentation

## 2015-10-15 IMAGING — MG MM DIGITAL DIAGNOSTIC BILAT W/ TOMO W/ CAD
8 of 18 series · 8 of 38 positions shown · non-contrast
Comparison: Previous exam(s).

CLINICAL DATA: Diffuse bilateral breast pain

EXAM:
2D DIGITAL DIAGNOSTIC BILATERAL MAMMOGRAM WITH CAD AND ADJUNCT TOMO

[L CC]
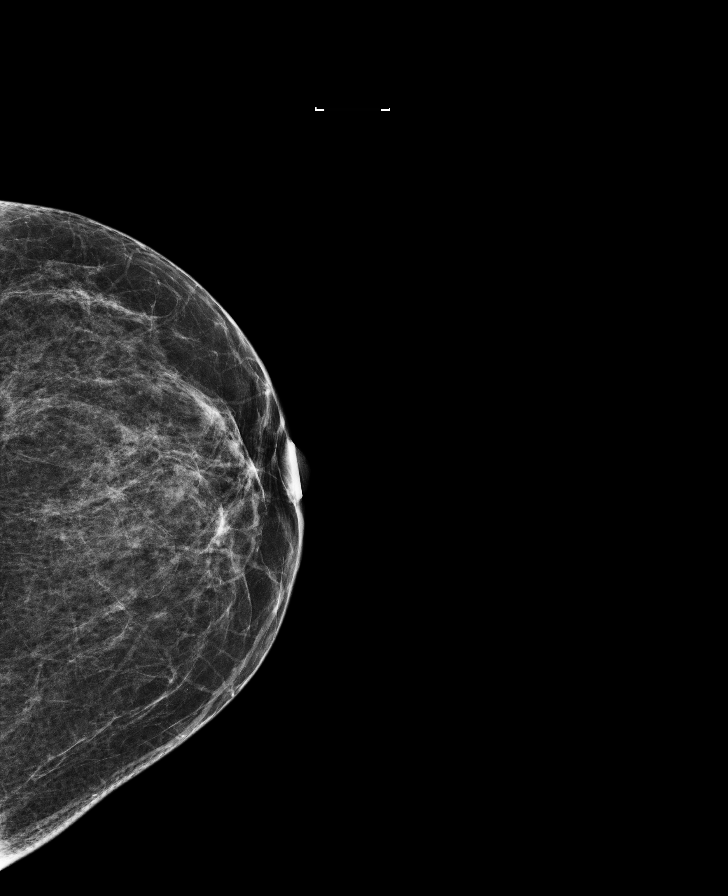

[R CC (1 of 2)]
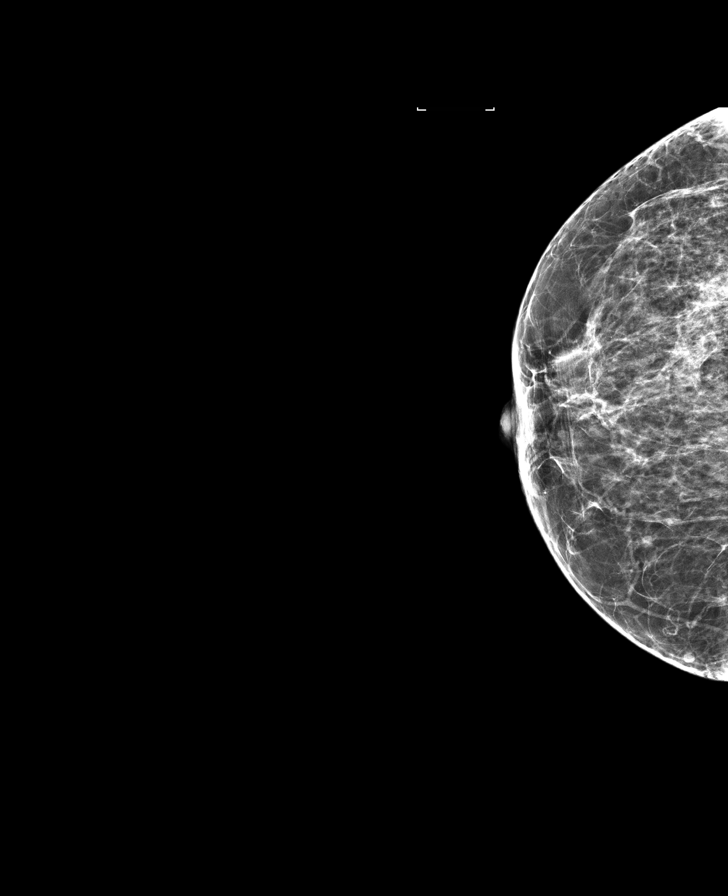

[L MLO (1 of 2)]
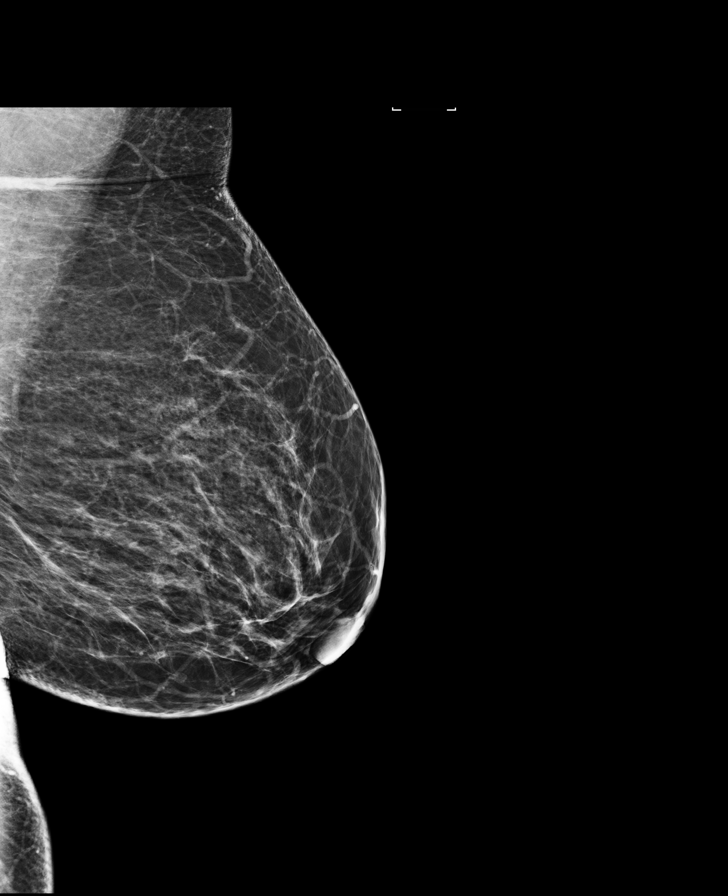

[R CC synth-2D]
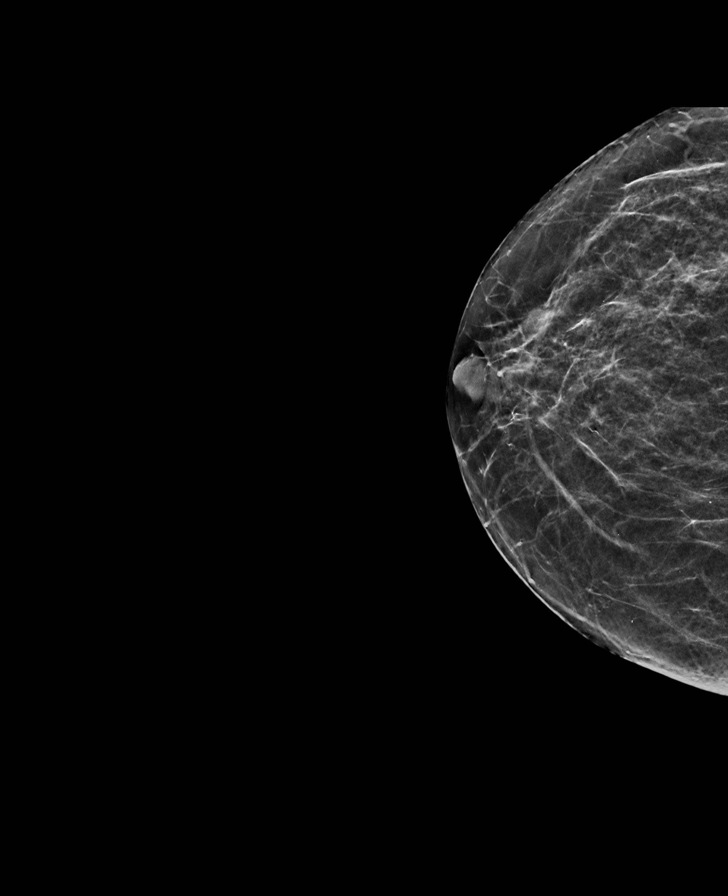

[R XCCL]
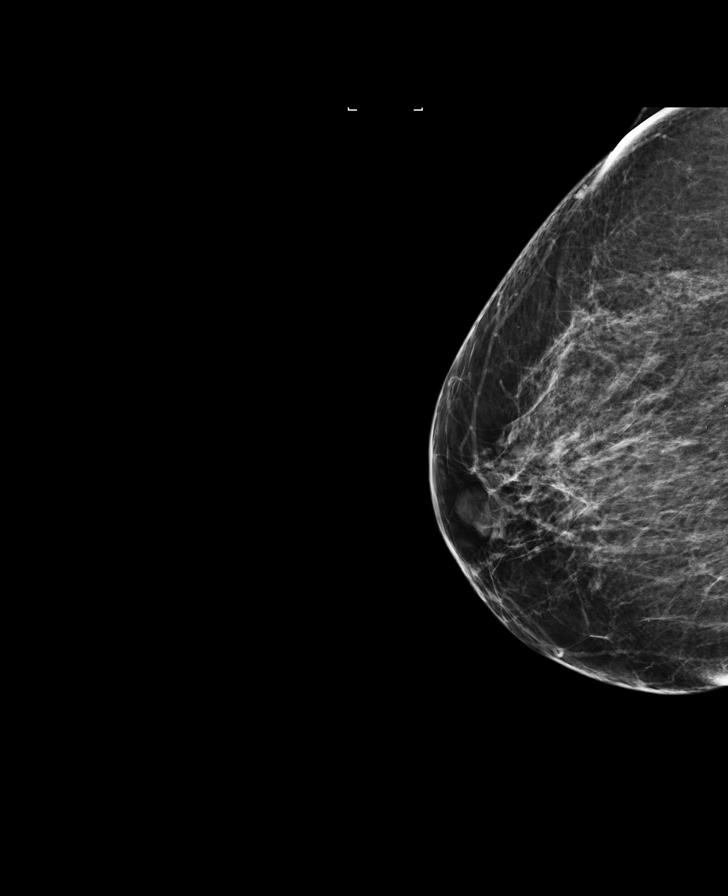

[R MLO synth-2D]
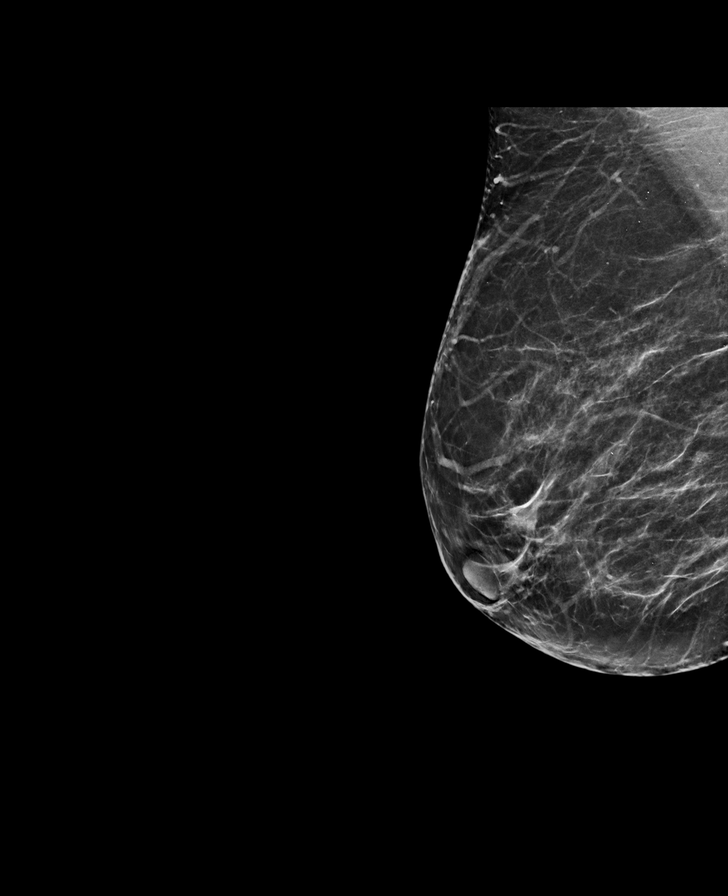

[R CC (2 of 2)]
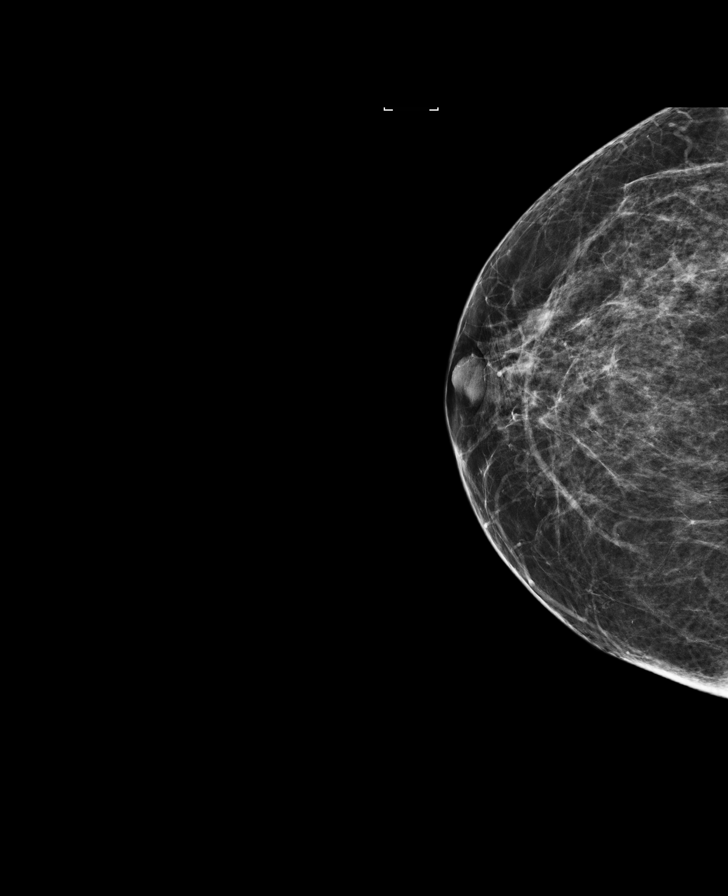

[L MLO (2 of 2)]
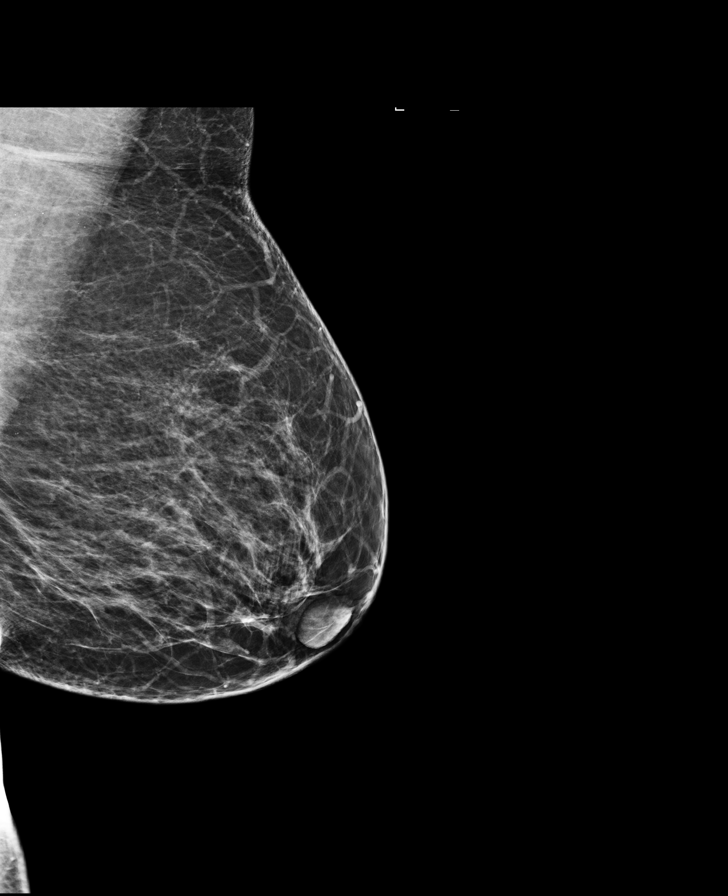

[8 of 38 positions shown; findings below may reference images not displayed]

ACR Breast Density Category b: There are scattered areas of
fibroglandular density.
FINDINGS: Cc and MLO views of bilateral breasts are submitted. No suspicious
abnormalities identified bilaterally.

Mammographic images were processed with CAD.
IMPRESSION: Negative.

RECOMMENDATION:
Routine screening mammogram in 1 year.

I have discussed the findings and recommendations with the patient.
Results were also provided in writing at the conclusion of the
visit. If applicable, a reminder letter will be sent to the patient
regarding the next appointment.

BI-RADS CATEGORY  1: Negative.

## 2015-12-14 ENCOUNTER — Ambulatory Visit (INDEPENDENT_AMBULATORY_CARE_PROVIDER_SITE_OTHER): Payer: 59 | Admitting: Internal Medicine

## 2015-12-14 ENCOUNTER — Encounter: Payer: Self-pay | Admitting: Internal Medicine

## 2015-12-14 VITALS — BP 128/74 | HR 79 | Ht 64.0 in | Wt 174.8 lb

## 2015-12-14 DIAGNOSIS — G4733 Obstructive sleep apnea (adult) (pediatric): Secondary | ICD-10-CM

## 2015-12-14 NOTE — Progress Notes (Signed)
09/25/13- 87 yoF former smoker referred courtesy of  Dr Kathe Mariner sleep study in [place] about 8 years ago; not on CPAP  Was told then she had mild sleep apnea and was not treated. Now family complaining her snoring is very loud. Admits daytime tiredness. Difficulty breathing comfortably while on Left side, but she denies hx of cardiopulmonary disease or ENT surgery. Has been treated for hypothyroid. 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 No family OSA hx. Bedtime 10:30- 11:00 PM, sleep latency 15 minutes, waking once before up 5:45 AM.  12/14/2015-61 year old female former smoker followed for sleep problems, complicated by AS, NSTEMI Unattended Home Sleep Test-11/01/2013-AHI 36.7/hour, desaturation to 76%, body weight 172 pounds She decided to start CPAP per phone message 02/12/2015 CPAP auto 5-20/[facility] FOLLOW FOR: doing well on CPAP.  Needs updated supplies.   Download indicates 4 hour compliance only 23% of nights. Control is good with AHI 1.9/hour.  ROS-see HPI Constitutional:   No-   weight loss, night sweats, fevers, chills, +fatigue, lassitude. HEENT:   No-  headaches, difficulty swallowing, tooth/dental problems, sore throat,       No-  sneezing, itching, ear ache, nasal congestion, post nasal drip,  CV:  No-   chest pain, orthopnea, PND, swelling in lower extremities, anasarca,                                                 dizziness, palpitations Resp: + shortness of breath with exertion or at rest.              No-   productive cough,  No non-productive cough,  No- coughing up of blood.              No-   change in color of mucus.  No- wheezing.   Skin: No-   rash or lesions. GI:  No-   heartburn, indigestion, abdominal pain, nausea, vomiting, diarrhea,                 change in bowel habits, loss of appetite GU: No-   dysuria, change in color of urine, no urgency or frequency.  No- flank pain. MS:  No-   joint pain or swelling.  No- decreased range of motion.   No- back pain. Neuro-     nothing unusual Psych:  No- change in mood or affect. No depression or anxiety.  No memory loss.  OBJ- Physical Exam General- Alert, Oriented, Affect-appropriate, Distress- none acute. Medium build Skin- rash-none, lesions- none, excoriation- none Lymphadenopathy- none Head- atraumatic            Eyes- Gross vision intact, PERRLA, conjunctivae and secretions clear            Ears- Hearing, canals-normal            Nose- Clear, no-Septal dev, mucus, polyps, erosion, perforation             Throat- Mallampati IV , mucosa clear , drainage- none, tonsils- atrophic Neck- flexible , trachea midline, no stridor , thyroid nl, carotid no bruit Chest - symmetrical excursion , unlabored           Heart/CV- RRR ,  Murmur+ AS 1/6 , no gallop  , no rub, nl s1 s2                           -  JVD- none , edema- none, stasis changes- none, varices- none           Lung- clear to P&A, wheeze- none, cough- none , dullness-none, rub- none           Chest wall-  Abd- tender-no, distended-no, bowel sounds-present, HSM- no Br/ Gen/ Rectal- Not done, not indicated Extrem- cyanosis- none, clubbing, none, atrophy- none, strength- nl Neuro- grossly intact to observation

## 2015-12-14 NOTE — Patient Instructions (Addendum)
Order- D[facility]   Please change auto range to 5-15, continue mask of choice, humidifier, supplies, AirView. Needs replacement mask and supplies now please.   DX OSA  We hope you will be able to use CPAP all night, every night and whenever you sleep. The minimum goal is to wear it at least 4 hours, at least 70% of nights.  If you notice something is uncomfortable, please let [facility] or [place] know about it, so we can fix it.

## 2016-01-17 ENCOUNTER — Other Ambulatory Visit: Payer: Self-pay | Admitting: Family Medicine

## 2016-01-17 NOTE — Telephone Encounter (Signed)
Last office visit 08/11/2015 with Gentry Fitz.  Last TSH 08/06/14.  No future appointments.  Refill?

## 2016-01-18 NOTE — Telephone Encounter (Signed)
Last office visit 08/11/15 with [facility] for cough.  Last office visit with PCP 08/10/2014. Patient has been left messages and mailed a letter in March and April 2017 to schedule CPE.   No future appointments scheduled.  Refill?

## 2016-01-23 NOTE — Telephone Encounter (Signed)
Ok to refill 30, 2 refills  Schedule cpx

## 2016-01-24 NOTE — Telephone Encounter (Signed)
Left detailed message on cell (DPR) as instructed by telephone.

## 2016-01-24 NOTE — Telephone Encounter (Signed)
Spoke to patient by telephone and CPX scheduled. Rx sent to pharmacy as instructed.

## 2016-01-25 ENCOUNTER — Other Ambulatory Visit: Payer: Self-pay | Admitting: Family Medicine

## 2016-02-18 NOTE — Telephone Encounter (Signed)
Pt said pharmacy said levothyroxine was denied; spoke with Wells Guiles at [facility] and pt has available refill but too early to get filled. Pt voiced understanding.

## 2016-02-25 ENCOUNTER — Other Ambulatory Visit: Payer: Self-pay | Admitting: Family Medicine

## 2016-02-25 DIAGNOSIS — E785 Hyperlipidemia, unspecified: Secondary | ICD-10-CM

## 2016-02-25 DIAGNOSIS — Z114 Encounter for screening for human immunodeficiency virus [HIV]: Secondary | ICD-10-CM

## 2016-02-25 DIAGNOSIS — R5383 Other fatigue: Secondary | ICD-10-CM

## 2016-02-25 DIAGNOSIS — E038 Other specified hypothyroidism: Secondary | ICD-10-CM

## 2016-03-01 ENCOUNTER — Other Ambulatory Visit (INDEPENDENT_AMBULATORY_CARE_PROVIDER_SITE_OTHER): Payer: 59

## 2016-03-01 DIAGNOSIS — Z114 Encounter for screening for human immunodeficiency virus [HIV]: Secondary | ICD-10-CM

## 2016-03-01 DIAGNOSIS — R5383 Other fatigue: Secondary | ICD-10-CM | POA: Diagnosis not present

## 2016-03-01 DIAGNOSIS — E785 Hyperlipidemia, unspecified: Secondary | ICD-10-CM

## 2016-03-01 DIAGNOSIS — E038 Other specified hypothyroidism: Secondary | ICD-10-CM

## 2016-03-01 LAB — BASIC METABOLIC PANEL
BUN: 13 mg/dL (ref 6–23)
CHLORIDE: 105 meq/L (ref 96–112)
CO2: 27 meq/L (ref 19–32)
CREATININE: 0.7 mg/dL (ref 0.40–1.20)
Calcium: 9.1 mg/dL (ref 8.4–10.5)
GFR: 90.79 mL/min (ref >60.00)
Glucose, Bld: 107 mg/dL — ABNORMAL HIGH (ref 70–99)
Potassium: 4.1 mEq/L (ref 3.5–5.1)
Sodium: 141 mEq/L (ref 135–145)

## 2016-03-01 LAB — CBC WITH DIFFERENTIAL/PLATELET
BASOS PCT: 0.5 % (ref 0.0–3.0)
Basophils Absolute: 0 10*3/uL (ref 0.0–0.1)
EOS ABS: 0.2 10*3/uL (ref 0.0–0.7)
Eosinophils Relative: 2.7 % (ref 0.0–5.0)
HCT: 37.7 % (ref 36.0–46.0)
Hemoglobin: 12.6 g/dL (ref 12.0–15.0)
Lymphocytes Relative: 27.7 % (ref 12.0–46.0)
Lymphs Abs: 1.9 10*3/uL (ref 0.7–4.0)
MCHC: 33.4 g/dL (ref 30.0–36.0)
MCV: 80.4 fl (ref 78.0–100.0)
Monocytes Absolute: 0.5 10*3/uL (ref 0.1–1.0)
Monocytes Relative: 7.2 % (ref 3.0–12.0)
NEUTROS ABS: 4.3 10*3/uL (ref 1.4–7.7)
Neutrophils Relative %: 61.9 % (ref 43.0–77.0)
PLATELETS: 223 10*3/uL (ref 150.0–400.0)
RBC: 4.69 Mil/uL (ref 3.87–5.11)
RDW: 13.9 % (ref 11.5–15.5)
WBC: 7 10*3/uL (ref 4.0–10.5)

## 2016-03-01 LAB — HEPATIC FUNCTION PANEL
ALK PHOS: 74 U/L (ref 39–117)
ALT: 50 U/L — ABNORMAL HIGH (ref 0–35)
AST: 40 U/L — ABNORMAL HIGH (ref 0–37)
Albumin: 4.3 g/dL (ref 3.5–5.2)
BILIRUBIN DIRECT: 0.1 mg/dL (ref 0.0–0.3)
TOTAL PROTEIN: 6.8 g/dL (ref 6.0–8.3)
Total Bilirubin: 0.4 mg/dL (ref 0.2–1.2)

## 2016-03-01 LAB — LIPID PANEL
CHOL/HDL RATIO: 6
Cholesterol: 178 mg/dL (ref 0–200)
HDL: 29.1 mg/dL — AB (ref >39.00)
Triglycerides: 735 mg/dL — ABNORMAL HIGH (ref 0.0–149.0)

## 2016-03-01 LAB — T4, FREE: FREE T4: 0.66 ng/dL (ref 0.60–1.60)

## 2016-03-01 LAB — TSH: TSH: 4.71 u[IU]/mL — ABNORMAL HIGH (ref 0.35–4.50)

## 2016-03-01 LAB — T3, FREE: T3 FREE: 3.3 pg/mL (ref 2.3–4.2)

## 2016-03-01 LAB — LDL CHOLESTEROL, DIRECT: Direct LDL: 30 mg/dL

## 2016-03-02 ENCOUNTER — Encounter: Payer: Self-pay | Admitting: Family Medicine

## 2016-03-02 ENCOUNTER — Ambulatory Visit (INDEPENDENT_AMBULATORY_CARE_PROVIDER_SITE_OTHER): Payer: 59 | Admitting: Family Medicine

## 2016-03-02 VITALS — BP 134/87 | HR 79 | Temp 99.1°F | Ht 63.75 in | Wt 174.5 lb

## 2016-03-02 DIAGNOSIS — Z0000 Encounter for general adult medical examination without abnormal findings: Secondary | ICD-10-CM

## 2016-03-02 DIAGNOSIS — R739 Hyperglycemia, unspecified: Secondary | ICD-10-CM

## 2016-03-02 DIAGNOSIS — E785 Hyperlipidemia, unspecified: Secondary | ICD-10-CM

## 2016-03-02 DIAGNOSIS — E038 Other specified hypothyroidism: Secondary | ICD-10-CM | POA: Diagnosis not present

## 2016-03-02 DIAGNOSIS — Z23 Encounter for immunization: Secondary | ICD-10-CM

## 2016-03-02 DIAGNOSIS — E781 Pure hyperglyceridemia: Secondary | ICD-10-CM | POA: Diagnosis not present

## 2016-03-02 LAB — HIV ANTIBODY (ROUTINE TESTING W REFLEX): HIV 1&2 Ab, 4th Generation: NONREACTIVE

## 2016-03-02 MED ORDER — GLUCOSE BLOOD VI STRP: ORAL_STRIP | 3 refills | Status: DC

## 2016-03-02 MED ORDER — LEVOTHYROXINE SODIUM 75 MCG PO TABS: 75.0000 ug | ORAL_TABLET | Freq: Every day | ORAL | 1 refills | Status: DC

## 2016-03-02 MED ORDER — NITROGLYCERIN 0.4 MG SL SUBL
SUBLINGUAL_TABLET | SUBLINGUAL | 0 refills | Status: DC
Start: 2016-03-02 — End: 2016-06-23

## 2016-03-02 NOTE — Progress Notes (Signed)
Pre visit review using our clinic review tool, if applicable. No additional management support is needed unless otherwise documented below in the visit note. 

## 2016-03-02 NOTE — Progress Notes (Signed)
Dr. Frederico Hamman T. Vernal Hritz, MD, [facility] [place] [address] [place], [address] Phone: [phone] Fax: [phone]  03/02/2016  Patient: Brandi Mahoney, MRN: [id number], DOB: [birth year], 60 y.o.  Primary Physician:  Owens Loffler, MD   Chief Complaint  Patient presents with  . Annual Exam   Subjective:   Brandi Mahoney is a 60 y.o. pleasant patient who presents with the following:  Health Maintenance Summary Reviewed and updated, unless pt declines services.  Tobacco History Reviewed. Non-smoker Alcohol: No concerns, no excessive use Exercise Habits: intermittent, rec at least 30 mins 5 times a week STD concerns: none Drug Use: None Birth control method: hyst Menses regular: no Lumps or breast concerns: no Breast Cancer Family History: sister  Right below right and left shoulder blade.   Chest pain / shortness of breath  Slight increase synthroid  Smoker - 30 years Sob?  She feels sluggish and tired most of the time now.  She is using her sleep apnea machine.  She has been taking her thyroid medication.  She also is getting short of breath somewhat.   She has a relatively clean cardiac catheterization, no congestive heart failure, and normal spirometry all within the last 2 years.  Health Maintenance  Topic Date Due  . INFLUENZA VACCINE  09/16/2016 (Originally 01/18/2016)  . PAP SMEAR  03/17/2016  . COLONOSCOPY  11/14/2016  . MAMMOGRAM  10/14/2017  . TETANUS/TDAP  05/25/2019  . Hepatitis C Screening  Completed  . HIV Screening  Completed  . URINE MICROALBUMIN  Excluded    Immunization History  Administered Date(s) Administered  . Influenza Split 03/19/2013  . Influenza-Unspecified 03/20/2015  . Pneumococcal Polysaccharide-23 03/02/2016  . Td 05/24/2009   Patient Active Problem List   Diagnosis Date Noted  . NSTEMI (non-ST elevated myocardial infarction), 01/2010 [facility] 09/19/2013    Priority: High  .  Hyperlipemia 04/01/2010    Priority: High  . HYPERTRIGLYCERIDEMIA 07/21/2009    Priority: High  . Hypothyroidism 09/08/2008    Priority: High  . Major depressive disorder, recurrent episode, in full remission ([place]) 09/08/2008    Priority: Medium  . Renal stone   . Obstructive sleep apnea 09/25/2013  . 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 IBS (irritable bowel syndrome) 09/26/2012  . PARESTHESIA, HANDS 06/01/2010  . 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 ASTHMA 09/08/2008   Past Medical History:  Diagnosis Date  . Asthma   . Depression   . Diabetes mellitus    type II off medication  . Hemorrhoids   . History of pneumonia 1969  . HLD (hyperlipidemia)   . Hypothyroidism   . IBS (irritable bowel syndrome)   . Migraine headache   . Myocarditis ([place]) 01/21/2010   hospitalized- [facility]  . NSTEMI (non-ST elevated myocardial infarction), 01/2010 [facility] 09/19/2013   Secondary to viral myocarditis    Past Surgical History:  Procedure Laterality Date  . APPENDECTOMY  1974  . BREAST BIOPSY Right 1988   neg  . CARDIAC CATHETERIZATION  8/11   no significant -[facility]- Dr Rockey Situ,  . CHOLECYSTECTOMY  2002  . COLONOSCOPY  2013  . NEPHROLITHOTOMY Right 08/20/2014   Procedure: RIGHT PERCUTANEOUS NEPHROLITHOTOMY ;  Surgeon: Malka So, MD;  Location: [facility];  Service: Urology;  Laterality: Right;  . TOTAL ABDOMINAL HYSTERECTOMY W/ BILATERAL SALPINGOOPHORECTOMY  2000   no ovaries, uterus, or cervix.   Social History   Social History  . Marital status: Married  Spouse name: Nicole Kindred  . Number of children: 3  . Years of education: N/A   Occupational History  . 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  Verdie Mosher.    Social History Main Topics  . Smoking status: Former Smoker    Packs/day: 0.35    Years: 35.00    Types: Cigarettes    Quit date: 09/25/2008  . Smokeless tobacco: Never Used     Comment: quit in 2007-2008, social smoker  . Alcohol use Yes       Comment: rarely  . Drug use: No  . Sexual activity: Not on file   Other Topics Concern  . Not on file   Social History Narrative   No reg exercise   Family History  Problem Relation Age of Onset  . Breast cancer Paternal Grandmother   . Lung cancer Paternal Grandfather     CAD  . Coronary artery disease Mother   . Diabetes Mother   . ALS Mother   . Other Daughter     gluten  . Hyperlipidemia Father   . Down syndrome Son   . Colon cancer Maternal Grandfather   . Cancer Sister 42    breast  . Breast cancer Sister 66   Allergies  Allergen Reactions  . Pseudoeph-Doxylamine-Dm-Apap Anaphylaxis    Allergy to nyquil  . Gluten Other (See Comments)    Gi upset  . Penicillins Itching and Rash    Medication list has been reviewed and updated.   General: Denies fever, chills, sweats. No significant weight loss. FATIGUE Eyes: Denies blurring,significant itching ENT: Denies earache, sore throat, and hoarseness.  Cardiovascular: Denies chest pains, palpitations, dyspnea on exertion,  Respiratory: Denies cough, dyspnea at rest,wheeezing Breast: no concerns about lumps GI: Denies nausea, vomiting, diarrhea, constipation, change in bowel habits, abdominal pain, melena, hematochezia GU: Denies dysuria, hematuria, urinary hesitancy, nocturia, denies STD risk, no concerns about discharge Musculoskeletal: Denies back pain, joint pain Derm: Denies rash, itching Neuro: Denies  paresthesias, frequent falls, frequent headaches Psych: Denies depression, anxiety Endocrine: Denies cold intolerance, heat intolerance, polydipsia Heme: Denies enlarged lymph nodes Allergy: No hayfever  Objective:   BP 134/87   Pulse 79   Temp 99.1 F (37.3 C) (Oral)   Ht 5' 3.75" (1.619 m)   Wt 174 lb 8 oz (79.2 kg)   BMI 30.19 kg/m  No exam data present  GEN: well developed, well nourished, no acute distress Eyes: conjunctiva and lids normal, PERRLA, EOMI ENT: TM clear, nares clear, oral  exam WNL Neck: supple, no lymphadenopathy, no thyromegaly, no JVD Pulm: clear to auscultation and percussion, respiratory effort normal CV: regular rate and rhythm, S1-S2, no murmur, rub or gallop, no bruits Chest: no scars, masses, no lumps BREAST: breast exam declined GI: soft, non-tender; no hepatosplenomegaly, masses; active bowel sounds all quadrants GU: GU exam declined Lymph: no cervical, axillary or inguinal adenopathy MSK: gait normal, muscle tone and strength WNL, no joint swelling, effusions, discoloration, crepitus  SKIN: clear, good turgor, color WNL, no rashes, lesions, or ulcerations Neuro: normal mental status, normal strength, sensation, and motion Psych: alert; oriented to person, place and time, normally interactive and not anxious or depressed in appearance.   All labs reviewed with patient. Lipids:    Component Value Date/Time   CHOL 178 03/01/2016 0825   CHOL 151 10/17/2011 0826   TRIG (H) 03/01/2016 0825    735.0 Triglyceride is over 400; calculations on Lipids are invalid.  TRIG 136 10/17/2011 0826   HDL 29.10 (L) 03/01/2016 0825   HDL 44 10/17/2011 0826   LDLDIRECT 30.0 03/01/2016 0825   VLDL 55.8 (H) 08/06/2014 1338   VLDL 27 10/17/2011 0826   CHOLHDL 6 03/01/2016 0825   CBC: CBC Latest Ref Rng & Units 03/01/2016 07/18/2015 08/24/2014  WBC 4.0 - 10.5 K/uL 7.0 7.3 -  Hemoglobin 12.0 - 15.0 g/dL 12.6 12.8 8.4(L)  Hematocrit 36.0 - 46.0 % 37.7 37.8 25.4(L)  Platelets 150.0 - 400.0 K/uL 223.0 226 -    Basic Metabolic Panel:    Component Value Date/Time   NA 141 03/01/2016 0825   NA 139 08/05/2013 1739   K 4.1 03/01/2016 0825   K 3.7 08/05/2013 1739   CL 105 03/01/2016 0825   CL 108 (H) 08/05/2013 1739   CO2 27 03/01/2016 0825   CO2 24 08/05/2013 1739   BUN 13 03/01/2016 0825   BUN 15 08/05/2013 1739   CREATININE 0.70 03/01/2016 0825   CREATININE 0.63 08/05/2013 1739   GLUCOSE 107 (H) 03/01/2016 0825   GLUCOSE 108 (H) 08/05/2013 1739   CALCIUM  9.1 03/01/2016 0825   CALCIUM 8.6 08/05/2013 1739   Hepatic Function Latest Ref Rng & Units 03/01/2016 07/18/2015 08/16/2014  Total Protein 6.0 - 8.3 g/dL 6.8 7.0 6.7  Albumin 3.5 - 5.2 g/dL 4.3 4.3 4.2  AST 0 - 37 U/L 40(H) 34 37  ALT 0 - 35 U/L 50(H) 39 40(H)  Alk Phosphatase 39 - 117 U/L 74 76 64  Total Bilirubin 0.2 - 1.2 mg/dL 0.4 0.6 <0.1(L)  Bilirubin, Direct 0.0 - 0.3 mg/dL 0.1 - -    Lab Results  Component Value Date   TSH 4.71 (H) 03/01/2016   No results found.  Assessment and Plan:   Healthcare maintenance  Need for prophylactic vaccination against Streptococcus pneumoniae (pneumococcus) - Plan: Pneumococcal polysaccharide vaccine 23-valent greater than or equal to 2yo subcutaneous/IM  Other specified hypothyroidism - Plan: T4, free, T3, free, TSH  HYPERTRIGLYCERIDEMIA - Plan: Lipid panel  Hyperlipemia - Plan: Lipid panel  Hyperglycemia - Plan: Hemoglobin A1c  TRIGLYCERIDES ARE MARKEDLY HIGH.  gREATER THAN 700.  She tells me that when she was initially diagnosed her triglycerides were greater than 2000.  Her father also had the same.   She has not been taking her cholesterol medication as prescribed.  Resume.  Recheck 2 months.  Slightly elevated TSH with normal free T3 and free T4.  We will do a trial of  Increased Synthroid and recheck in 2 months.  Health Maintenance Exam: The patient's preventative maintenance and recommended screening tests for an annual wellness exam were reviewed in full today. Brought up to date unless services declined.  Counselled on the importance of diet, exercise, and its role in overall health and mortality. The patient's FH and SH was reviewed, including their home life, tobacco status, and drug and alcohol status.  Follow-up: No Follow-up on file. Or follow-up in 1 year for complete physical examination  Modified Medications   Modified Medication Previous Medication   GLUCOSE BLOOD (ACCU-CHEK AVIVA) TEST STRIP glucose blood  (ACCU-CHEK AVIVA) test strip      Use to check blood sugar once daily    Use as instructed    LEVOTHYROXINE (SYNTHROID, LEVOTHROID) 75 MCG TABLET levothyroxine (SYNTHROID, LEVOTHROID) 50 MCG tablet      Take 1 tablet (75 mcg total) by mouth daily before breakfast.    TAKE 1 TABLET (50 MCG TOTAL) BY MOUTH DAILY.  NITROGLYCERIN (NITROSTAT) 0.4 MG SL TABLET nitroGLYCERIN (NITROSTAT) 0.4 MG SL tablet      1 tablet under tongue at onset of chest pain; you may repeat every 5 minutes for up to 3 doses.    1 tablet under tongue at onset of chest pain; you may repeat every 5 minutes for up to 3 doses.   Orders Placed This Encounter  Procedures  . Pneumococcal polysaccharide vaccine 23-valent greater than or equal to 2yo subcutaneous/IM  . T4, free  . T3, free  . TSH  . Hemoglobin A1c  . Lipid panel    Signed,  Frederico Hamman T. Mercie Balsley, MD   Patient's Medications  New Prescriptions   No medications on file  Previous Medications   ASPIRIN EC 81 MG TABLET    Take 81 mg by mouth daily.   FENOFIBRATE (TRICOR) 145 MG TABLET    TAKE 1 TABLET (160 MG TOTAL) BY MOUTH DAILY.   GABAPENTIN (NEURONTIN) 300 MG CAPSULE    Take 300 mg by mouth daily.    MAGNESIUM GLUCONATE (MAGONATE) 500 MG TABLET    Take 500 mg by mouth daily.    MULTIPLE VITAMINS-MINERALS (CENTRUM SILVER) TABLET    Take 1 tablet by mouth daily.   PRAVASTATIN (PRAVACHOL) 40 MG TABLET    TAKE 1 TABLET (40 MG TOTAL) BY MOUTH AT BEDTIME.   SERTRALINE (ZOLOFT) 50 MG TABLET    TAKE ONE TABLET DAILY.  Modified Medications   Modified Medication Previous Medication   GLUCOSE BLOOD (ACCU-CHEK AVIVA) TEST STRIP glucose blood (ACCU-CHEK AVIVA) test strip      Use to check blood sugar once daily    Use as instructed    LEVOTHYROXINE (SYNTHROID, LEVOTHROID) 75 MCG TABLET levothyroxine (SYNTHROID, LEVOTHROID) 50 MCG tablet      Take 1 tablet (75 mcg total) by mouth daily before breakfast.    TAKE 1 TABLET (50 MCG TOTAL) BY MOUTH DAILY.   NITROGLYCERIN  (NITROSTAT) 0.4 MG SL TABLET nitroGLYCERIN (NITROSTAT) 0.4 MG SL tablet      1 tablet under tongue at onset of chest pain; you may repeat every 5 minutes for up to 3 doses.    1 tablet under tongue at onset of chest pain; you may repeat every 5 minutes for up to 3 doses.  Discontinued Medications   No medications on file

## 2016-03-03 ENCOUNTER — Encounter: Payer: Self-pay | Admitting: Family Medicine

## 2016-03-12 ENCOUNTER — Encounter: Payer: Self-pay | Admitting: Internal Medicine

## 2016-03-12 NOTE — Assessment & Plan Note (Signed)
Compliance needs to be better. We discussed comfort, medical issues, sleep hygiene, alternatives to CPAP Plan try changing pressure ranged auto 5-15 to see if lower peak pressure reduces her tendency to take mask off during the night. Explained the pressure change will make a difference if she doesn't put the mask on at all.

## 2016-03-15 ENCOUNTER — Encounter: Payer: Self-pay | Admitting: Internal Medicine

## 2016-03-30 ENCOUNTER — Other Ambulatory Visit: Payer: Self-pay | Admitting: Family Medicine

## 2016-04-30 ENCOUNTER — Other Ambulatory Visit: Payer: Self-pay | Admitting: Family Medicine

## 2016-05-02 ENCOUNTER — Other Ambulatory Visit (INDEPENDENT_AMBULATORY_CARE_PROVIDER_SITE_OTHER): Payer: 59

## 2016-05-02 DIAGNOSIS — E781 Pure hyperglyceridemia: Secondary | ICD-10-CM

## 2016-05-02 DIAGNOSIS — E038 Other specified hypothyroidism: Secondary | ICD-10-CM

## 2016-05-02 DIAGNOSIS — E785 Hyperlipidemia, unspecified: Secondary | ICD-10-CM | POA: Diagnosis not present

## 2016-05-02 DIAGNOSIS — R739 Hyperglycemia, unspecified: Secondary | ICD-10-CM

## 2016-05-02 LAB — TSH: TSH: 2 u[IU]/mL (ref 0.35–4.50)

## 2016-05-02 LAB — HEMOGLOBIN A1C: Hgb A1c MFr Bld: 6.5 % (ref 4.6–6.5)

## 2016-05-02 LAB — LIPID PANEL
CHOL/HDL RATIO: 3
Cholesterol: 133 mg/dL (ref 0–200)
HDL: 43.4 mg/dL (ref >39.00)
LDL CALC: 60 mg/dL (ref 0–99)
NONHDL: 89.5
Triglycerides: 147 mg/dL (ref 0.0–149.0)
VLDL: 29.4 mg/dL (ref 0.0–40.0)

## 2016-05-02 LAB — T4, FREE: FREE T4: 0.87 ng/dL (ref 0.60–1.60)

## 2016-05-02 LAB — T3, FREE: T3, Free: 3.2 pg/mL (ref 2.3–4.2)

## 2016-05-23 ENCOUNTER — Other Ambulatory Visit: Payer: Self-pay | Admitting: Family Medicine

## 2016-06-20 ENCOUNTER — Emergency Department
Admission: EM | Admit: 2016-06-20 | Discharge: 2016-06-20 | Disposition: A | Discharge status: Home / Self Care | Payer: 59 | Attending: Emergency Medicine | Admitting: Emergency Medicine

## 2016-06-20 ENCOUNTER — Encounter: Payer: Self-pay | Admitting: *Deleted

## 2016-06-20 ENCOUNTER — Emergency Department: Payer: 59

## 2016-06-20 DIAGNOSIS — E039 Hypothyroidism, unspecified: Secondary | ICD-10-CM | POA: Insufficient documentation

## 2016-06-20 DIAGNOSIS — J45909 Unspecified asthma, uncomplicated: Secondary | ICD-10-CM | POA: Diagnosis not present

## 2016-06-20 DIAGNOSIS — R079 Chest pain, unspecified: Secondary | ICD-10-CM | POA: Diagnosis not present

## 2016-06-20 DIAGNOSIS — Z87891 Personal history of nicotine dependence: Secondary | ICD-10-CM | POA: Insufficient documentation

## 2016-06-20 DIAGNOSIS — Z79899 Other long term (current) drug therapy: Secondary | ICD-10-CM | POA: Insufficient documentation

## 2016-06-20 DIAGNOSIS — I252 Old myocardial infarction: Secondary | ICD-10-CM | POA: Insufficient documentation

## 2016-06-20 DIAGNOSIS — Z7982 Long term (current) use of aspirin: Secondary | ICD-10-CM | POA: Diagnosis not present

## 2016-06-20 DIAGNOSIS — R0602 Shortness of breath: Secondary | ICD-10-CM | POA: Diagnosis not present

## 2016-06-20 LAB — CBC
HCT: 38 % (ref 35.0–47.0)
Hemoglobin: 12.5 g/dL (ref 12.0–16.0)
MCH: 26.7 pg (ref 26.0–34.0)
MCHC: 32.9 g/dL (ref 32.0–36.0)
MCV: 81.3 fL (ref 80.0–100.0)
PLATELETS: 277 10*3/uL (ref 150–440)
RBC: 4.68 MIL/uL (ref 3.80–5.20)
RDW: 13.7 % (ref 11.5–14.5)
WBC: 9.3 10*3/uL (ref 3.6–11.0)

## 2016-06-20 LAB — BASIC METABOLIC PANEL
Anion gap: 5 (ref 5–15)
BUN: 15 mg/dL (ref 6–20)
CALCIUM: 9.4 mg/dL (ref 8.9–10.3)
CO2: 28 mmol/L (ref 22–32)
CREATININE: 0.71 mg/dL (ref 0.44–1.00)
Chloride: 106 mmol/L (ref 101–111)
GFR calc Af Amer: >60 mL/min (ref >60)
GFR calc non Af Amer: >60 mL/min (ref >60)
GLUCOSE: 115 mg/dL — AB (ref 65–99)
Potassium: 4 mmol/L (ref 3.5–5.1)
Sodium: 139 mmol/L (ref 135–145)

## 2016-06-20 LAB — TROPONIN I: Troponin I: <0.03 ng/mL (ref <0.03)

## 2016-06-20 IMAGING — CR DG CHEST 2V
2 series · 2 of 2 positions shown · non-contrast
Comparison: [DATE]

CLINICAL DATA: Left-sided chest pain with arm tingling

EXAM:
CHEST  2 VIEW

[chest pa]
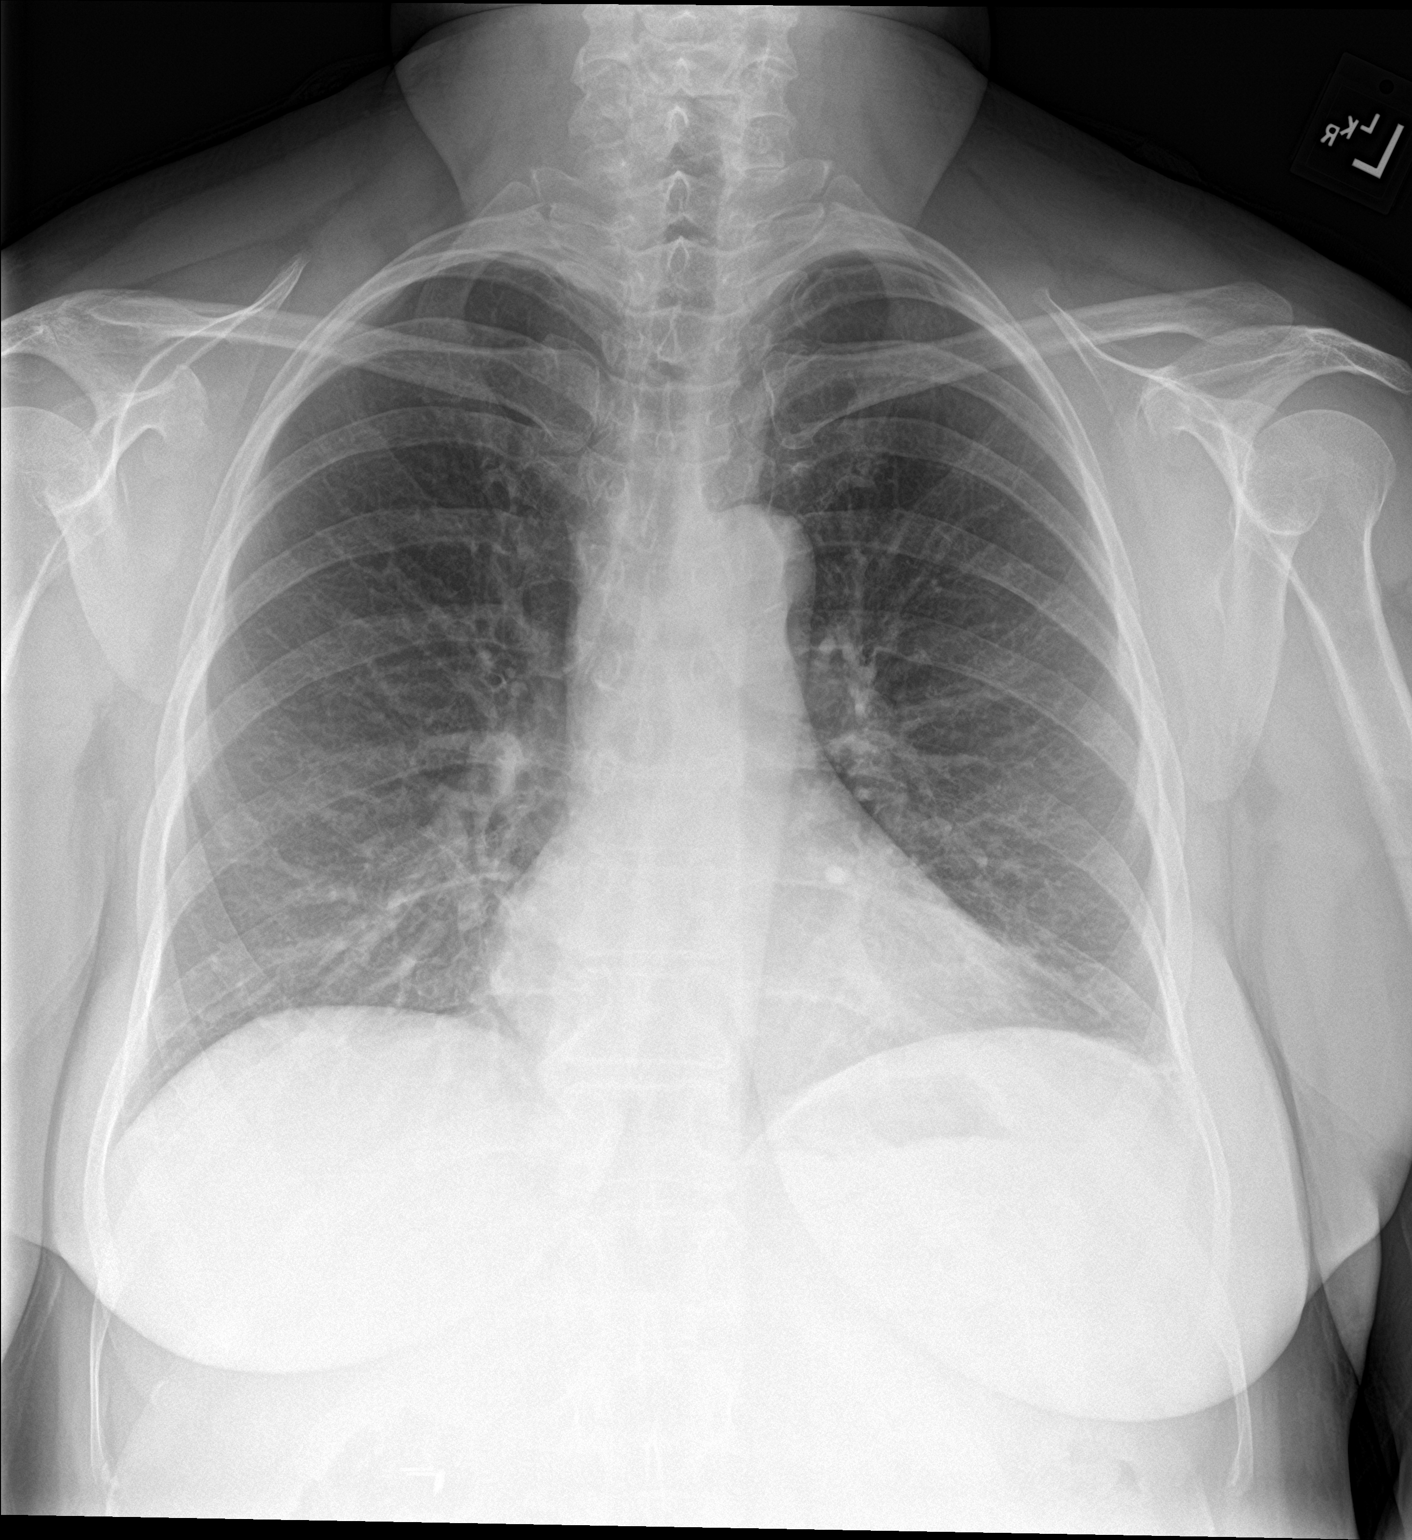

[chest lat]
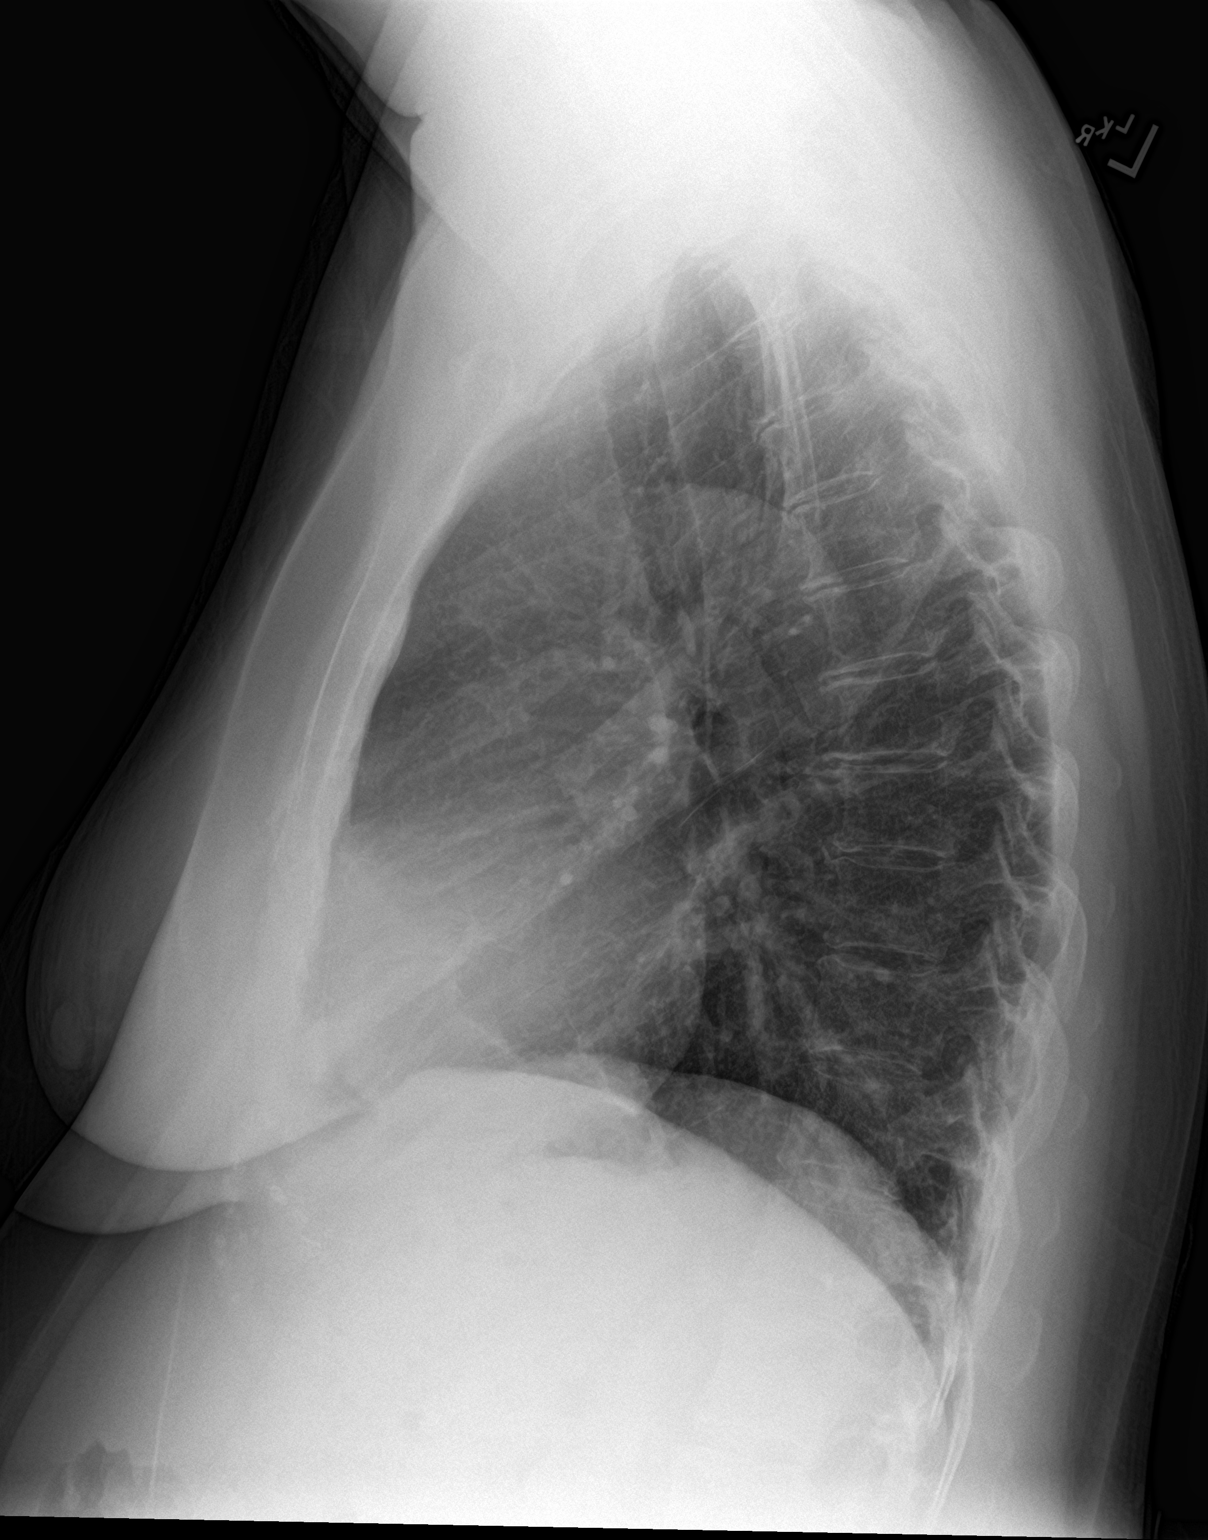

[2 of 2 positions shown; findings below may reference images not displayed]

FINDINGS: Minimal scar or atelectasis at the lingula. No acute infiltrate or
effusion. Cardiomediastinal silhouette within normal limits. No
pneumothorax. Surgical clips in the right upper quadrant.
IMPRESSION: No active cardiopulmonary disease.

## 2016-06-20 NOTE — Discharge Instructions (Signed)
I spoke to Dr. Humphrey Rolls one of the cardiologist here at the hospital. He feels it is okay if you to go home. He says he can see you in the office at 9:00 on Thursday. Alternatively you can go see Dr Curt Bears who is a cardiologist on-call today. I have not been able to get him. He should get a call his office tomorrow morning he maybe had a seizure tomorrow. Lee's return here at once if he get any worse. I would call 911.

## 2016-06-20 NOTE — ED Triage Notes (Signed)
Pt reports intermittent chest pain for 1 day.  Pt states pain beneath left breast.  Pt states sob.  No n/v/d.  Pt alert.

## 2016-06-20 NOTE — ED Notes (Signed)
Patient denies pain and is resting comfortably.  

## 2016-06-20 NOTE — ED Provider Notes (Addendum)
[facility] Emergency Department Provider Note   ____________________________________________   None    (approximate)  I have reviewed the triage vital signs and the nursing notes.   HISTORY  Chief Complaint Chest Pain    HPI Brandi Mahoney is a 61 y.o. female comes in complaining of shooting pain in her chest started this afternoon. Patient reports increased shortness of breath today pain is worse with exertion in fact when she got up and walked around her husband's car earlier this afternoon she developed achiness in her jaw and arm as well her left arm as well. Patient reports she had an episode several years ago where she had a virus which went to her heart apparently gave her some myocarditis. That was treated in the [facility] system but unfortunately I cannot access the old chart as it appears to be too old.   Past Medical History:  Diagnosis Date  . Asthma   . Depression   . Hemorrhoids   . History of pneumonia 1969  . HLD (hyperlipidemia)   . Hypothyroidism   . IBS (irritable bowel syndrome)   . Migraine headache   . Myocarditis ([place]) 01/21/2010   hospitalized- [facility]  . NSTEMI (non-ST elevated myocardial infarction), 01/2010 [facility] 09/19/2013   Secondary to viral myocarditis   . Prediabetes     Patient Active Problem List   Diagnosis Date Noted  . Renal stone   . Obstructive sleep apnea 09/25/2013  . 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 NSTEMI (non-ST elevated myocardial infarction), 01/2010 [facility] 09/19/2013  . IBS (irritable bowel syndrome) 09/26/2012  . PARESTHESIA, HANDS 06/01/2010  . Hyperlipemia 04/01/2010  . 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 HYPERTRIGLYCERIDEMIA 07/21/2009  . Hypothyroidism 09/08/2008  . Major depressive disorder, recurrent episode, in full remission ([place]) 09/08/2008  . ASTHMA 09/08/2008    Past Surgical History:  Procedure Laterality Date  . APPENDECTOMY  1974  . BREAST BIOPSY Right 1988   neg  .  CARDIAC CATHETERIZATION  8/11   no significant -[facility]- Dr Rockey Situ,  . CHOLECYSTECTOMY  2002  . COLONOSCOPY  2013  . NEPHROLITHOTOMY Right 08/20/2014   Procedure: RIGHT PERCUTANEOUS NEPHROLITHOTOMY ;  Surgeon: Malka So, MD;  Location: [facility];  Service: Urology;  Laterality: Right;  . TOTAL ABDOMINAL HYSTERECTOMY W/ BILATERAL SALPINGOOPHORECTOMY  2000   no ovaries, uterus, or cervix.    Prior to Admission medications   Medication Sig Start Date End Date Taking? Authorizing Provider  aspirin EC 81 MG tablet Take 81 mg by mouth daily.    Historical Provider, MD  fenofibrate (TRICOR) 145 MG tablet TAKE ONE TABLET BY MOUTH DAILY 03/30/16   Amy Cletis [place], MD  gabapentin (NEURONTIN) 300 MG capsule Take 300 mg by mouth at bedtime.     Historical Provider, MD  glucose blood (ACCU-CHEK AVIVA) test strip Use to check blood sugar once daily 03/02/16   Owens Loffler, MD  levothyroxine (SYNTHROID, LEVOTHROID) 75 MCG tablet Take 1 tablet (75 mcg total) by mouth daily before breakfast. 03/02/16   Owens Loffler, MD  magnesium gluconate (MAGONATE) 500 MG tablet Take 500 mg by mouth daily.     Historical Provider, MD  Multiple Vitamins-Minerals (CENTRUM SILVER) tablet Take 1 tablet by mouth daily.    Historical Provider, MD  nitroGLYCERIN (NITROSTAT) 0.4 MG SL tablet 1 tablet under tongue at onset of chest pain; you may repeat every 5 minutes for up to 3 doses. 03/02/16   Owens Loffler, MD  pravastatin (PRAVACHOL)  40 MG tablet TAKE 1 TABLET (40 MG TOTAL) BY MOUTH AT BEDTIME. 05/24/16   Owens Loffler, MD  sertraline (ZOLOFT) 50 MG tablet TAKE ONE TABLET BY MOUTH DAILY 04/30/16   Owens Loffler, MD    Allergies Pseudoeph-doxylamine-dm-apap; Gluten; and Penicillins  Family History  Problem Relation Age of Onset  . Coronary artery disease Mother   . Diabetes Mother   . ALS Mother   . Hyperlipidemia Father   . Breast cancer Paternal Grandmother   . Lung cancer Paternal Grandfather     CAD  . Other  Daughter     gluten  . Down syndrome Son   . Colon cancer Maternal Grandfather   . Cancer Sister 47    breast  . Breast cancer Sister 15    Social History Social History  Substance Use Topics  . Smoking status: Former Smoker    Packs/day: 0.35    Years: 35.00    Types: Cigarettes    Quit date: 09/25/2008  . Smokeless tobacco: Never Used     Comment: quit in 2007-2008, social smoker  . Alcohol use Yes     Comment: rarely    Review of Systems Constitutional: No fever/chills Eyes: No visual changes. ENT: No sore throat. Cardiovascular:chest pain. Respiratory:  shortness of breath. Gastrointestinal: No abdominal pain.  No nausea, no vomiting.  No diarrhea.  No constipation. Genitourinary: Negative for dysuria. Musculoskeletal: Negative for back pain. Skin: Negative for rash. Neurological: Negative for headaches, focal weakness or numbness.  10-point ROS otherwise negative.  ____________________________________________   PHYSICAL EXAM:  VITAL SIGNS: ED Triage Vitals  Enc Vitals Group     BP 06/20/16 1751 139/75     Pulse Rate 06/20/16 1751 93     Resp 06/20/16 1751 20     Temp 06/20/16 1751 98.6 F (37 C)     Temp Source 06/20/16 1751 Oral     SpO2 06/20/16 1751 95 %     Weight 06/20/16 1749 175 lb (79.4 kg)     Height 06/20/16 1749 5\' 4"  (1.626 m)     Head Circumference --      Peak Flow --      Pain Score 06/20/16 1750 4     Pain Loc --      Pain Edu? --      Excl. in [place]? --     Constitutional: Alert and oriented. Well appearing and in no acute distress. Eyes: Conjunctivae are normal. PERRL. EOMI. Head: Atraumatic. Nose: No congestion/rhinnorhea. Mouth/Throat: Mucous membranes are moist.  Oropharynx non-erythematous. Neck: No stridor.  Cardiovascular: Normal rate, regular rhythm. Grossly normal heart soundsExcept for a 3/6 systolic murmur heard best at the left sternal border.  Good peripheral circulation. Respiratory: Normal respiratory effort.  No  retractions. Lungs CTAB. Gastrointestinal: Soft and nontender. No distention. No abdominal bruits. No CVA tenderness. Musculoskeletal: No lower extremity tenderness nor edema.  No joint effusions. Neurologic:  Normal speech and language. No gross focal neurologic deficits are appreciated. No gait instability. Skin:  Skin is warm, dry and intact. No rash noted. Psychiatric: Mood and affect are normal. Speech and behavior are normal.  ____________________________________________   LABS (all labs ordered are listed, but only abnormal results are displayed)  Labs Reviewed  BASIC METABOLIC PANEL - Abnormal; Notable for the following:       Result Value   Glucose, Bld 115 (*)    All other components within normal limits  CBC  TROPONIN I  TROPONIN I   ____________________________________________  EKG  EKG read and interpreted by me shows normal sinus rhythm rate of 73 normal axis incomplete right bundle branch block no acute ST-T wave changes similar to 12/16/2015 ____________________________________________  RADIOLOGY  Study Result   CLINICAL DATA:  Left-sided chest pain with arm tingling  EXAM: CHEST  2 VIEW  COMPARISON:  08/05/2013  FINDINGS: Minimal scar or atelectasis at the lingula. No acute infiltrate or effusion. Cardiomediastinal silhouette within normal limits. No pneumothorax. Surgical clips in the right upper quadrant.  IMPRESSION: No active cardiopulmonary disease.   Electronically Signed   By: Donavan Foil M.D.   On: 06/20/2016 18:58     ____________________________________________   PROCEDURES  Procedure(s) performed:  Procedures  Critical Care performed:   ____________________________________________   INITIAL IMPRESSION / ASSESSMENT AND PLAN / ED COURSE  Pertinent labs & imaging results that were available during my care of the patient were reviewed by me and considered in my medical decision making (see chart for  details).    Clinical Course    Discussed patient with Dr. Humphrey Rolls cardiology. Reviewed the history including a myocarditis. He feels it is okay for the patient to home.  ____________________________________________   FINAL CLINICAL IMPRESSION(S) / ED DIAGNOSES  Final diagnoses:  Nonspecific chest pain      NEW MEDICATIONS STARTED DURING THIS VISIT:  New Prescriptions   No medications on file     Note:  This document was prepared using Dragon voice recognition software and may include unintentional dictation errors.    Nena Polio, MD 06/20/16 2229    Nena Polio, MD 06/20/16 2230

## 2016-06-22 ENCOUNTER — Encounter: Payer: Self-pay | Admitting: Emergency Medicine

## 2016-06-22 ENCOUNTER — Observation Stay
Admission: EM | Admit: 2016-06-22 | Discharge: 2016-06-23 | Disposition: A | Discharge status: Home / Self Care | Payer: 59 | Attending: Internal Medicine | Admitting: Internal Medicine

## 2016-06-22 ENCOUNTER — Emergency Department: Payer: 59

## 2016-06-22 ENCOUNTER — Telehealth: Payer: Self-pay | Admitting: Cardiovascular Disease

## 2016-06-22 DIAGNOSIS — E119 Type 2 diabetes mellitus without complications: Secondary | ICD-10-CM

## 2016-06-22 DIAGNOSIS — Z90710 Acquired absence of both cervix and uterus: Secondary | ICD-10-CM | POA: Diagnosis not present

## 2016-06-22 DIAGNOSIS — E039 Hypothyroidism, unspecified: Secondary | ICD-10-CM | POA: Insufficient documentation

## 2016-06-22 DIAGNOSIS — Z8249 Family history of ischemic heart disease and other diseases of the circulatory system: Secondary | ICD-10-CM | POA: Diagnosis not present

## 2016-06-22 DIAGNOSIS — R0602 Shortness of breath: Secondary | ICD-10-CM | POA: Diagnosis not present

## 2016-06-22 DIAGNOSIS — Z888 Allergy status to other drugs, medicaments and biological substances status: Secondary | ICD-10-CM | POA: Insufficient documentation

## 2016-06-22 DIAGNOSIS — R0789 Other chest pain: Secondary | ICD-10-CM | POA: Diagnosis not present

## 2016-06-22 DIAGNOSIS — I25119 Atherosclerotic heart disease of native coronary artery with unspecified angina pectoris: Secondary | ICD-10-CM

## 2016-06-22 DIAGNOSIS — J45909 Unspecified asthma, uncomplicated: Secondary | ICD-10-CM | POA: Diagnosis not present

## 2016-06-22 DIAGNOSIS — G4733 Obstructive sleep apnea (adult) (pediatric): Secondary | ICD-10-CM | POA: Diagnosis not present

## 2016-06-22 DIAGNOSIS — K589 Irritable bowel syndrome without diarrhea: Secondary | ICD-10-CM | POA: Diagnosis not present

## 2016-06-22 DIAGNOSIS — F329 Major depressive disorder, single episode, unspecified: Secondary | ICD-10-CM | POA: Insufficient documentation

## 2016-06-22 DIAGNOSIS — I25110 Atherosclerotic heart disease of native coronary artery with unstable angina pectoris: Secondary | ICD-10-CM | POA: Diagnosis not present

## 2016-06-22 DIAGNOSIS — I252 Old myocardial infarction: Secondary | ICD-10-CM | POA: Diagnosis not present

## 2016-06-22 DIAGNOSIS — M25512 Pain in left shoulder: Secondary | ICD-10-CM

## 2016-06-22 DIAGNOSIS — R079 Chest pain, unspecified: Secondary | ICD-10-CM | POA: Diagnosis not present

## 2016-06-22 DIAGNOSIS — S299XXA Unspecified injury of thorax, initial encounter: Secondary | ICD-10-CM | POA: Diagnosis not present

## 2016-06-22 DIAGNOSIS — K760 Fatty (change of) liver, not elsewhere classified: Secondary | ICD-10-CM | POA: Diagnosis not present

## 2016-06-22 DIAGNOSIS — R0600 Dyspnea, unspecified: Secondary | ICD-10-CM | POA: Diagnosis not present

## 2016-06-22 DIAGNOSIS — Z880 Allergy status to penicillin: Secondary | ICD-10-CM | POA: Diagnosis not present

## 2016-06-22 DIAGNOSIS — F419 Anxiety disorder, unspecified: Secondary | ICD-10-CM | POA: Insufficient documentation

## 2016-06-22 DIAGNOSIS — F411 Generalized anxiety disorder: Secondary | ICD-10-CM

## 2016-06-22 DIAGNOSIS — E785 Hyperlipidemia, unspecified: Secondary | ICD-10-CM | POA: Insufficient documentation

## 2016-06-22 DIAGNOSIS — Z87891 Personal history of nicotine dependence: Secondary | ICD-10-CM | POA: Diagnosis not present

## 2016-06-22 DIAGNOSIS — I700 Atherosclerosis of aorta: Secondary | ICD-10-CM | POA: Diagnosis not present

## 2016-06-22 DIAGNOSIS — J9811 Atelectasis: Secondary | ICD-10-CM | POA: Insufficient documentation

## 2016-06-22 DIAGNOSIS — Z7982 Long term (current) use of aspirin: Secondary | ICD-10-CM | POA: Insufficient documentation

## 2016-06-22 DIAGNOSIS — R7303 Prediabetes: Secondary | ICD-10-CM | POA: Insufficient documentation

## 2016-06-22 DIAGNOSIS — D734 Cyst of spleen: Secondary | ICD-10-CM | POA: Diagnosis not present

## 2016-06-22 HISTORY — DX: Atherosclerotic heart disease of native coronary artery without angina pectoris: I25.10

## 2016-06-22 LAB — BASIC METABOLIC PANEL
Anion gap: 6 (ref 5–15)
BUN: 16 mg/dL (ref 6–20)
CHLORIDE: 105 mmol/L (ref 101–111)
CO2: 27 mmol/L (ref 22–32)
CREATININE: 0.59 mg/dL (ref 0.44–1.00)
Calcium: 9.4 mg/dL (ref 8.9–10.3)
GFR calc Af Amer: >60 mL/min (ref >60)
GFR calc non Af Amer: >60 mL/min (ref >60)
GLUCOSE: 107 mg/dL — AB (ref 65–99)
POTASSIUM: 3.9 mmol/L (ref 3.5–5.1)
SODIUM: 138 mmol/L (ref 135–145)

## 2016-06-22 LAB — CBC
HEMATOCRIT: 38.3 % (ref 35.0–47.0)
Hemoglobin: 12.8 g/dL (ref 12.0–16.0)
MCH: 27.3 pg (ref 26.0–34.0)
MCHC: 33.4 g/dL (ref 32.0–36.0)
MCV: 81.7 fL (ref 80.0–100.0)
PLATELETS: 279 10*3/uL (ref 150–440)
RBC: 4.69 MIL/uL (ref 3.80–5.20)
RDW: 13.8 % (ref 11.5–14.5)
WBC: 9 10*3/uL (ref 3.6–11.0)

## 2016-06-22 LAB — TROPONIN I
Troponin I: <0.03 ng/mL (ref <0.03)
Troponin I: <0.03 ng/mL (ref <0.03)

## 2016-06-22 IMAGING — CR DG CHEST 2V
1 series · 2 of 2 positions shown · non-contrast
Comparison: [DATE]

CLINICAL DATA: Chest pain.  Shortness of breath.

EXAM:
CHEST  2 VIEW

[Series 1: dg chest 2 view · 0.14mm/px · 2 of 2 slices shown]
[im 1/2]
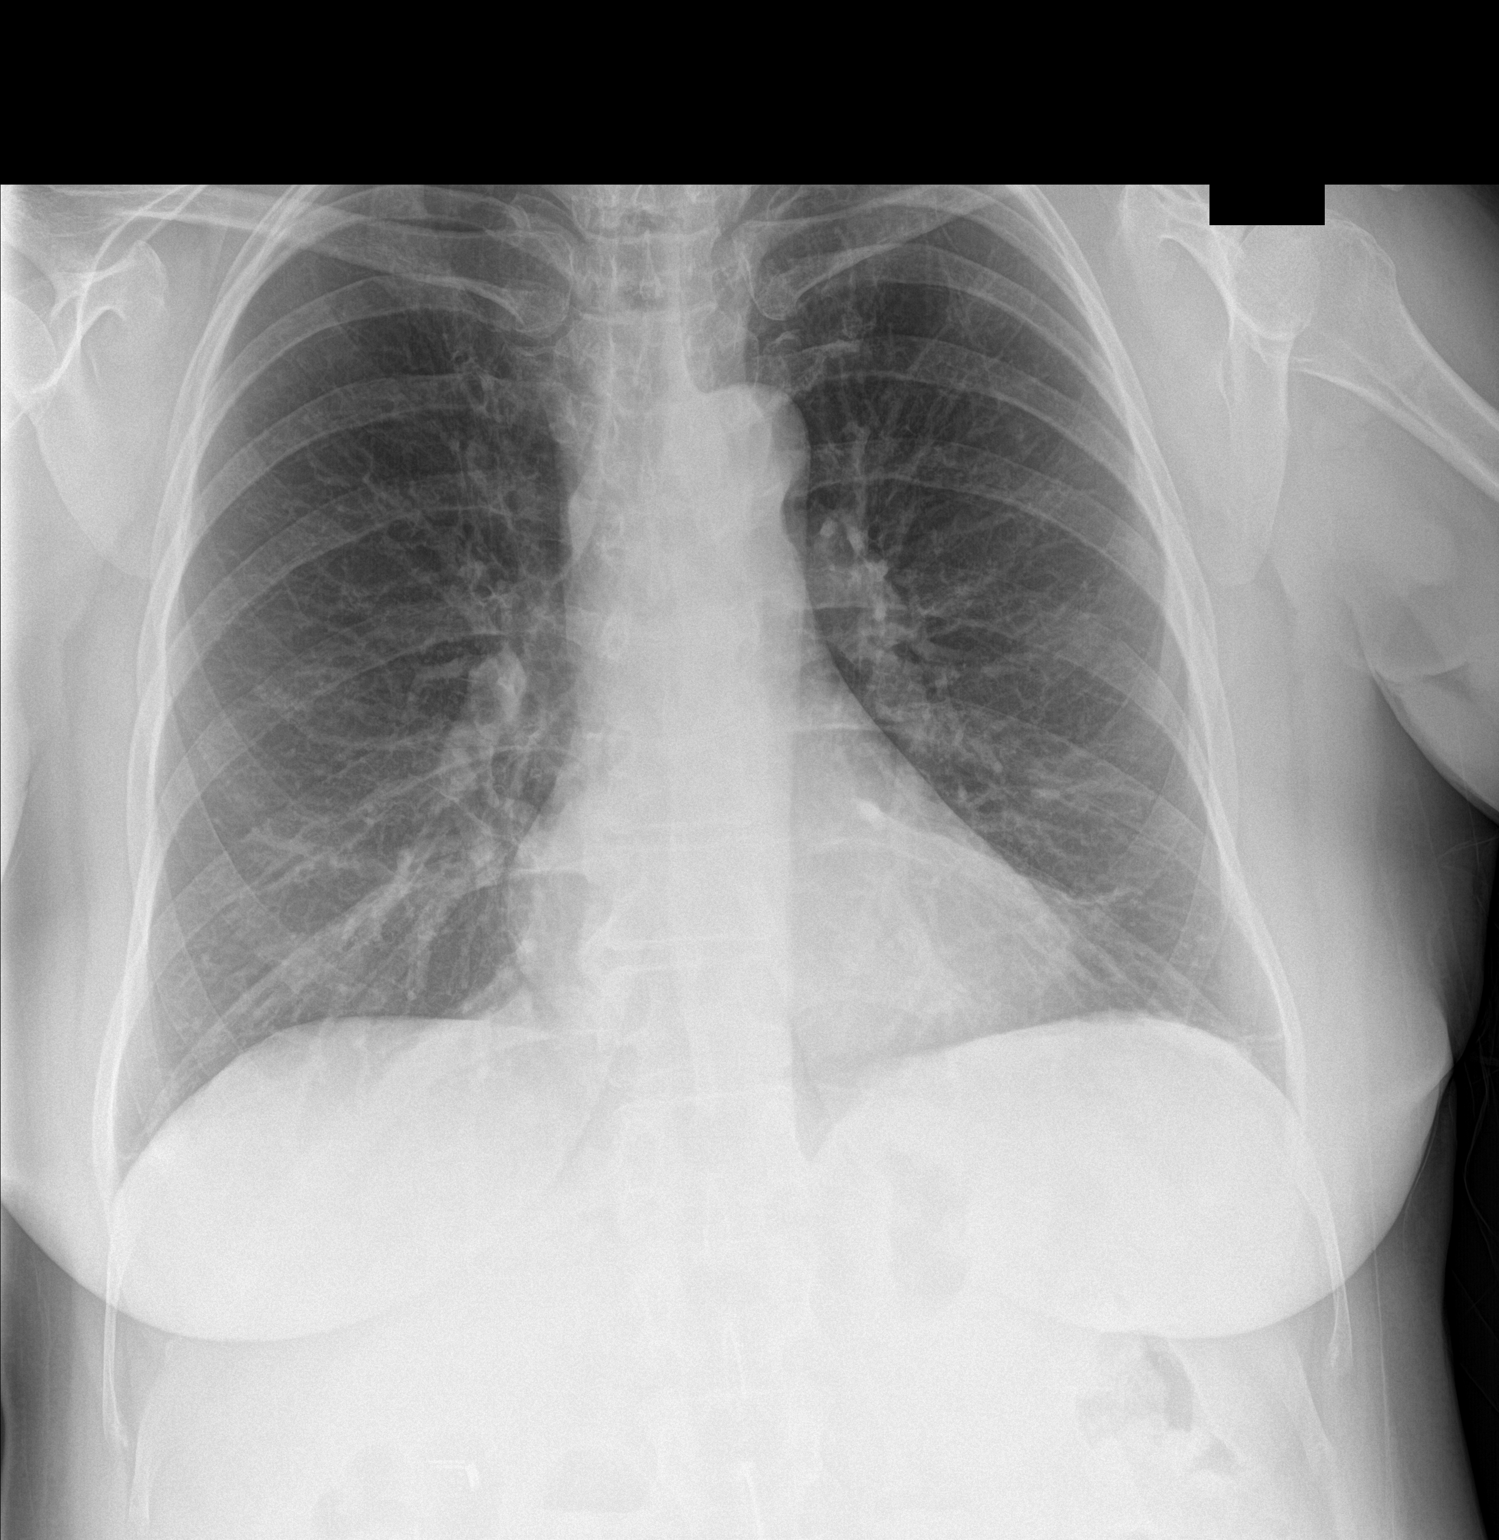
[im 2/2]
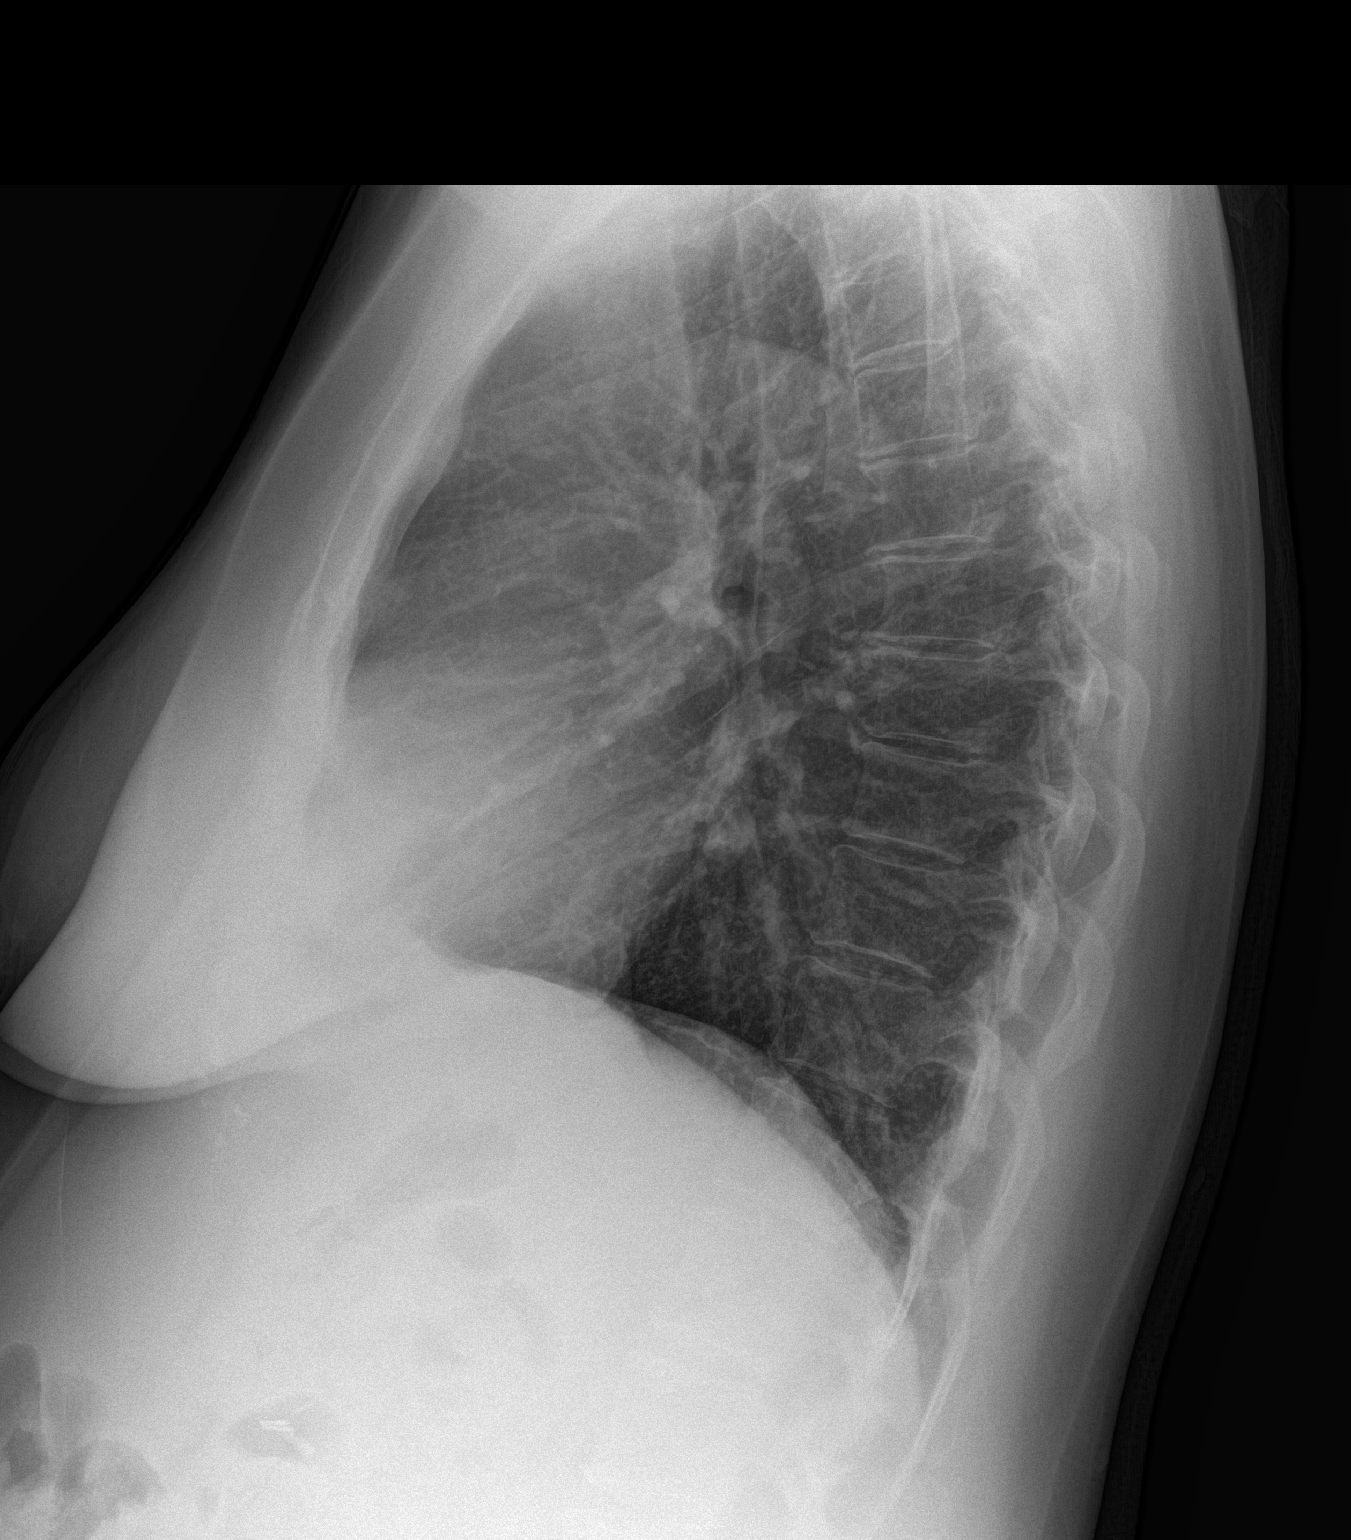

[2 of 2 positions shown; findings below may reference images not displayed]

FINDINGS: Minimal atelectasis in the left base. The heart, hila, mediastinum,
lungs, and pleura are normal.
IMPRESSION: No active cardiopulmonary disease.

## 2016-06-22 IMAGING — CT CT ANGIO CHEST
2 of 6 series · 18 of 46 positions shown · IV contrast (isovue)
Comparison: CT abdomen and pelvis from [DATE], CXR [DATE]

CLINICAL DATA: Left shoulder and fell pain radiating the arm.

EXAM:
CT ANGIOGRAPHY CHEST WITH CONTRAST
TECHNIQUE: Multidetector CT imaging of the chest was performed using the
standard protocol during bolus administration of intravenous
contrast. Multiplanar CT image reconstructions and MIPs were
obtained to evaluate the vascular anatomy.
CONTRAST:  75 cc of Isovue 370 IV

[Series 6: thins · axial · 0.73mm/px · z∈[-640,-391]mm · 16 of 273 slices shown]
[im 12/273  lung]
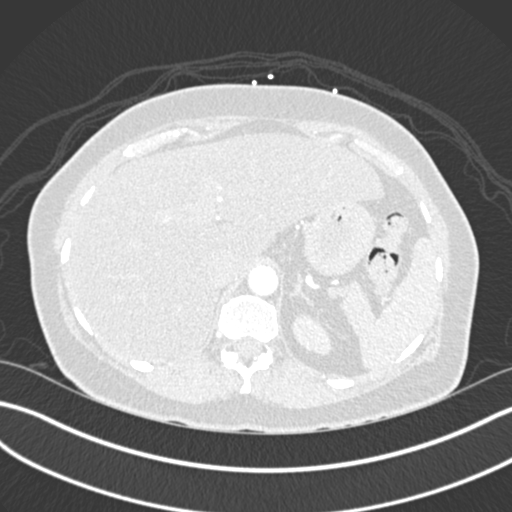
[im 36/273  soft-tissue]
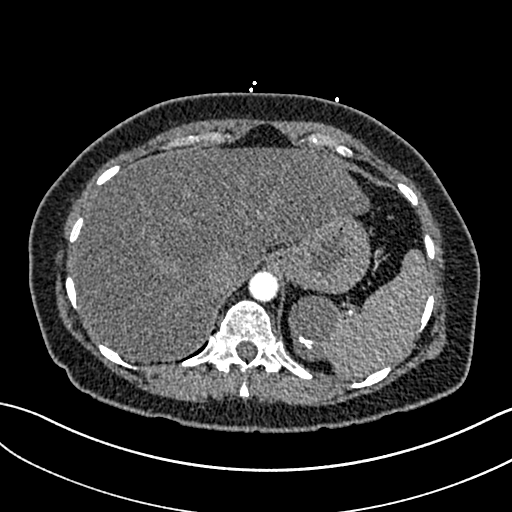
[im 48/273  lung]
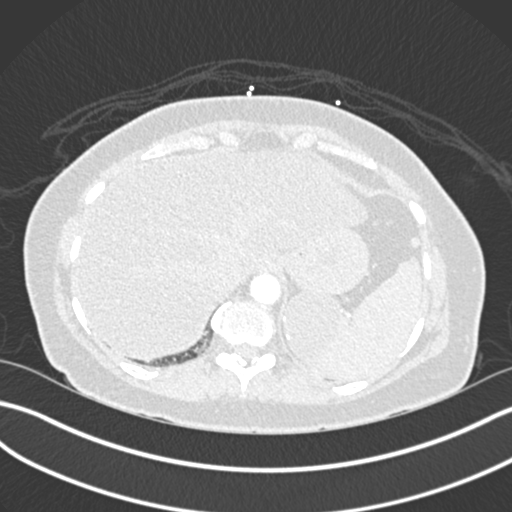
[im 60/273  soft-tissue]
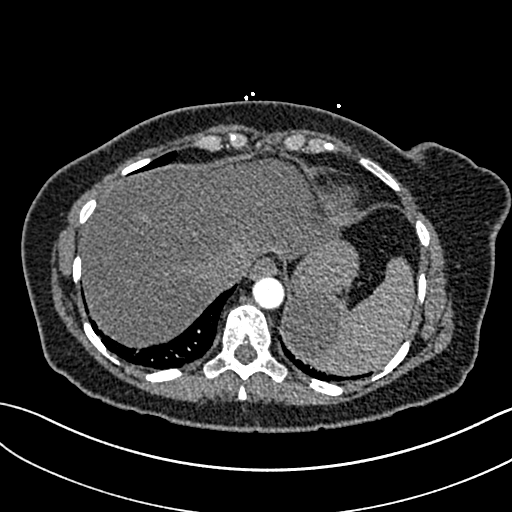
[im 83/273  lung]
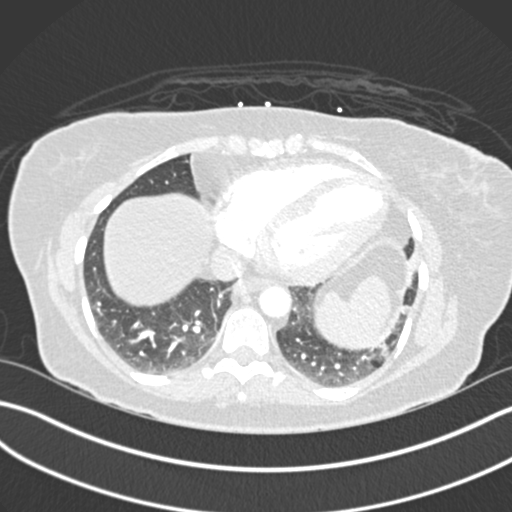
[im 95/273  soft-tissue]
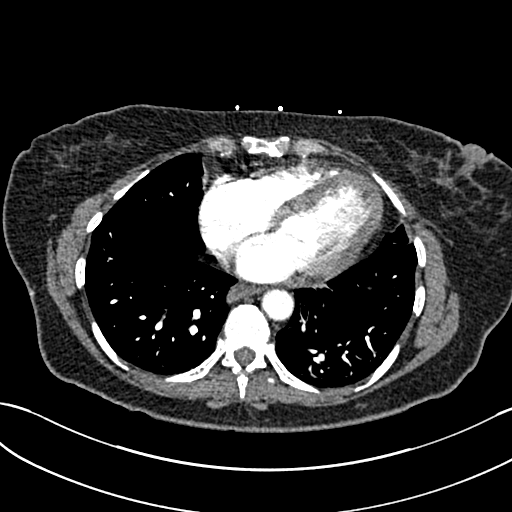
[im 107/273  lung]
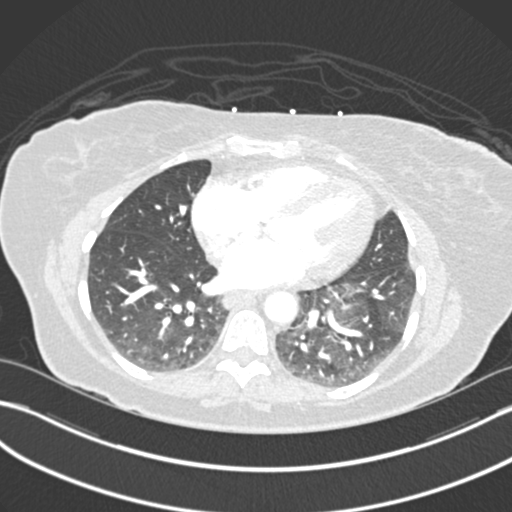
[im 131/273  soft-tissue]
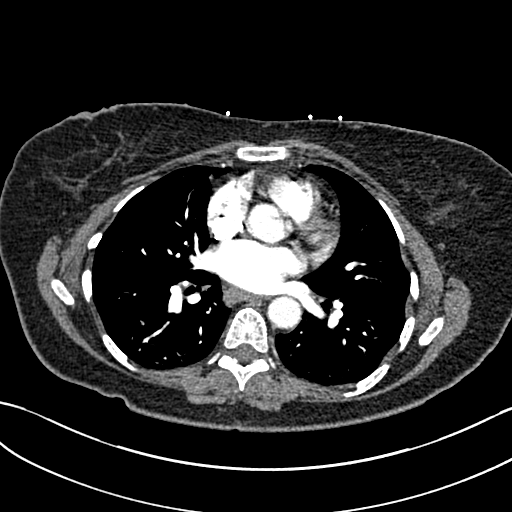
[im 142/273  lung]
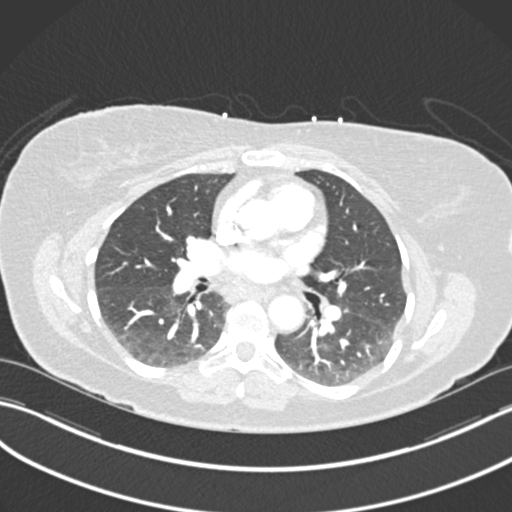
[im 166/273  soft-tissue]
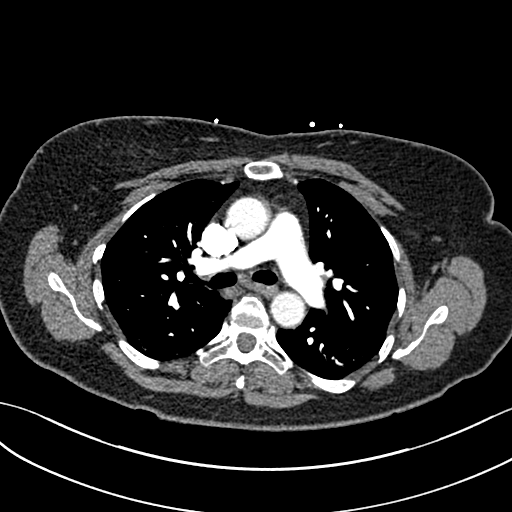
[im 178/273  lung]
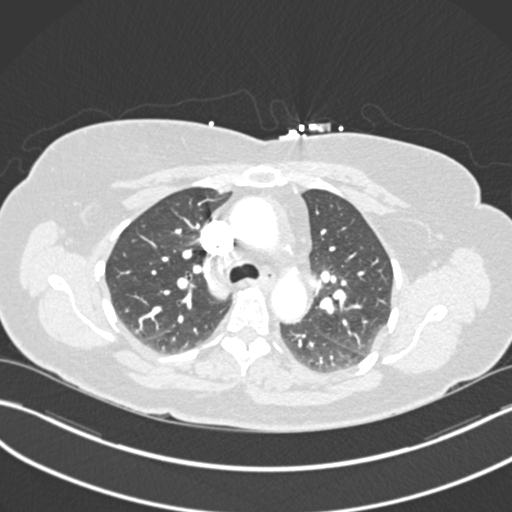
[im 190/273  soft-tissue]
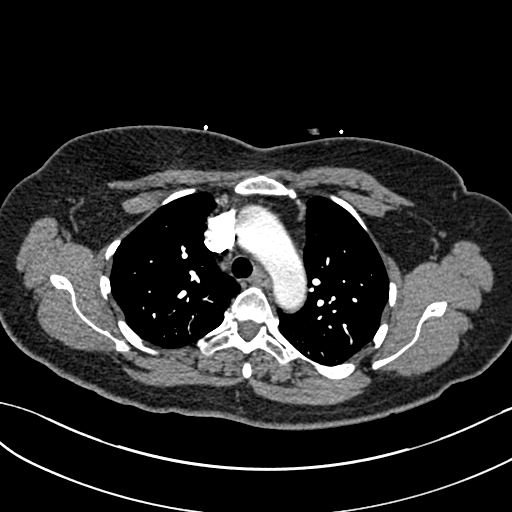
[im 213/273  lung]
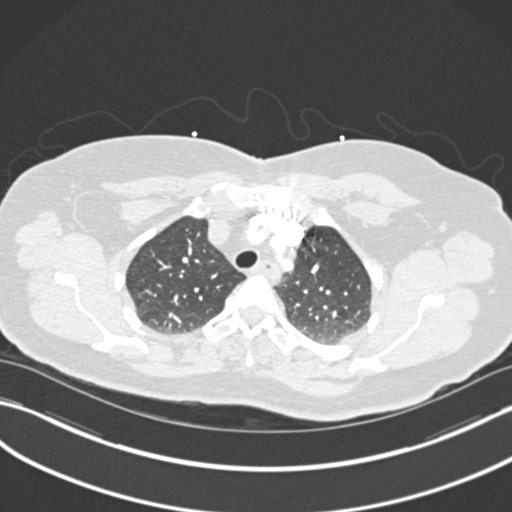
[im 225/273  soft-tissue]
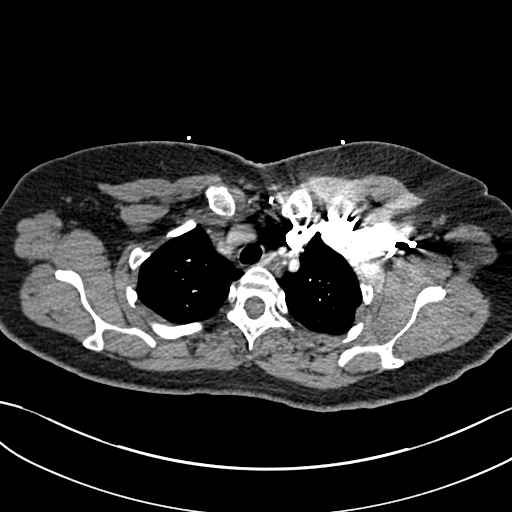
[im 237/273  lung]
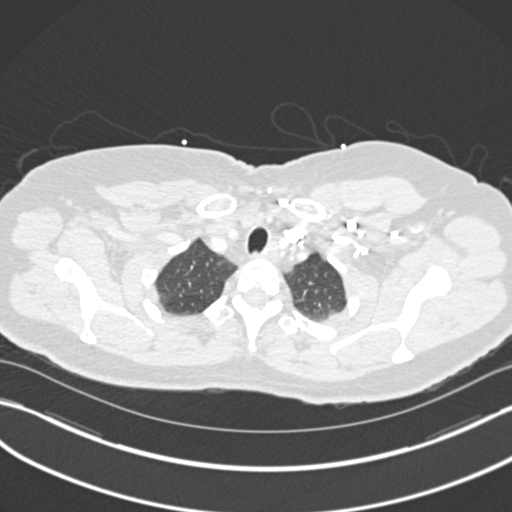
[im 261/273  soft-tissue]
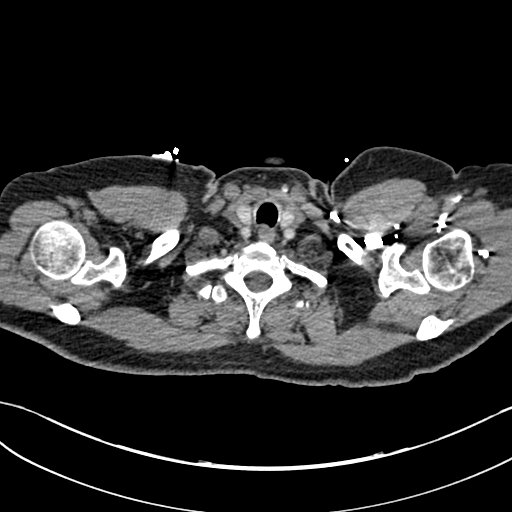

[Series 8: coronal mpr · coronal · 0.54mm/px · 2 of 78 slices shown]
[im 26/78  soft-tissue]
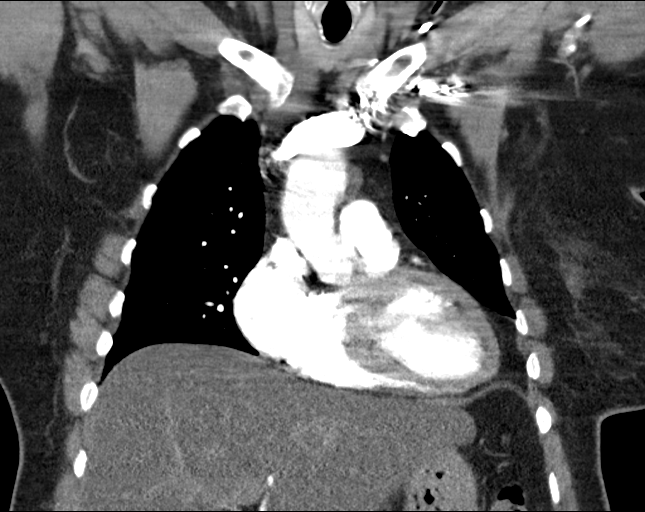
[im 52/78  soft-tissue]
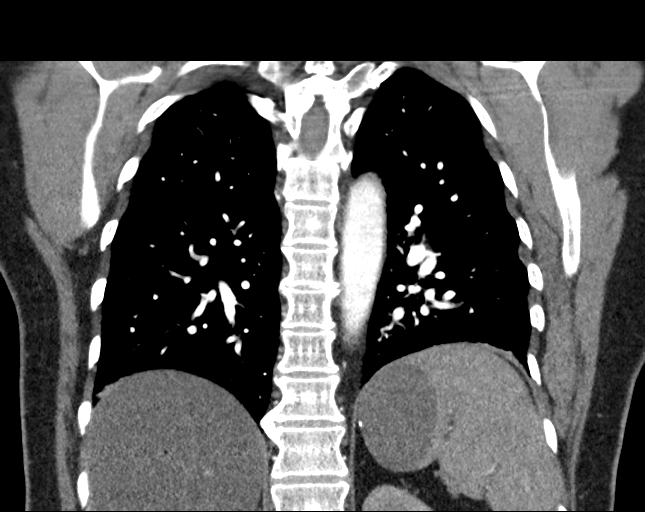

[18 of 46 positions shown; findings below may reference images not displayed]

FINDINGS: Cardiovascular: The study is of quality for the evaluation of
pulmonary embolism. There are no filling defects in the central,
lobar, segmental or subsegmental pulmonary artery branches to
suggest acute pulmonary embolism. Great vessels are normal in course
and caliber. Normal heart size. No significant pericardial
fluid/thickening. No aortic aneurysm or dissection. Aortic
atherosclerosis and coronary arteriosclerosis.

Mediastinum/Nodes: No discrete thyroid nodules. Unremarkable
esophagus. No pathologically enlarged axillary, mediastinal or hilar
lymph nodes. Patent trachea and mainstem bronchi.

Lungs/Pleura: No pneumothorax. No pleural effusion. Bibasilar
dependent atelectasis.

Upper abdomen: Partially calcified splenic cyst measuring 4.8 cm in
diameter is stable since [Z0] comparison. Hepatic steatosis is noted
without space-occupying mass. No acute upper abdominal abnormality.

Musculoskeletal:  No aggressive appearing focal osseous lesions.

Review of the MIP images confirms the above findings.
IMPRESSION: No acute pulmonary embolus, aortic aneurysm or dissection.

Aortic atherosclerosis and coronary arteriosclerosis.

Hepatic steatosis with stable partially calcified splenic cyst.

## 2016-06-22 MED ORDER — IOPAMIDOL (ISOVUE-370) INJECTION 76%
75.0000 mL | Freq: Once | INTRAVENOUS | Status: AC | PRN
Start: 2016-06-22 — End: 2016-06-22
  Administered 2016-06-22: 75 mL via INTRAVENOUS

## 2016-06-22 MED ORDER — LORAZEPAM 1 MG PO TABS
1.0000 mg | ORAL_TABLET | Freq: Once | ORAL | Status: AC
  Administered 2016-06-23: 1 mg via ORAL
  Filled 2016-06-22: qty 1

## 2016-06-22 MED ORDER — NITROGLYCERIN 0.4 MG SL SUBL: 0.4000 mg | SUBLINGUAL_TABLET | SUBLINGUAL | Status: DC | PRN

## 2016-06-22 MED ORDER — PROMETHAZINE HCL 25 MG/ML IJ SOLN: 12.5000 mg | Freq: Once | INTRAMUSCULAR | Status: DC

## 2016-06-22 MED ORDER — MORPHINE SULFATE (PF) 4 MG/ML IV SOLN
4.0000 mg | INTRAVENOUS | Status: DC | PRN
  Administered 2016-06-22: 4 mg via INTRAVENOUS
  Filled 2016-06-22: qty 1

## 2016-06-22 MED ORDER — SODIUM CHLORIDE 0.9 % IV BOLUS (SEPSIS)
1000.0000 mL | Freq: Once | INTRAVENOUS | Status: AC
  Administered 2016-06-22: 1000 mL via INTRAVENOUS

## 2016-06-22 NOTE — Telephone Encounter (Signed)
Pt spouse called, states pt is on her way to the ED with shoulder pain, chest pain, and jaw pain. He just wanted to let Dr. Rockey Situ know.

## 2016-06-22 NOTE — ED Triage Notes (Signed)
Pt c/o left shoulder and jaw pain that radiates to arm. Pain to left scapula as well. Feels short of breath and like someone sitting on chest. NAD at this time. Skin warm dry and pink

## 2016-06-22 NOTE — ED Provider Notes (Signed)
[facility] Emergency Department Provider Note    First MD Initiated Contact with Patient 06/22/16 2150     (approximate)  I have reviewed the triage vital signs and the nursing notes.   HISTORY  Chief Complaint Chest Pain    HPI Brandi Mahoney is a 61 y.o. female  with a history of asthma and depression and reported myocarditis presents with intermittent episodes of left sided shoulder pain and jaw pain radiates down her arm. Patient seen 2 days ago for similar discomfort. . Serial enzymes which were unremarkable and patient was discharged for follow-up with her cardiologist. Patient came back today due to persistent pain. States that she is having some exertional dyspnea. No orthopnea. No lower extremity swelling. No recent fevers. No cough. Denies any diaphoresis, nausea or vomiting.   Past Medical History:  Diagnosis Date  . Asthma   . Depression   . Hemorrhoids   . History of pneumonia 1969  . HLD (hyperlipidemia)   . Hypothyroidism   . IBS (irritable bowel syndrome)   . Migraine headache   . Myocarditis ([place]) 01/21/2010   hospitalized- [facility]  . NSTEMI (non-ST elevated myocardial infarction), 01/2010 [facility] 09/19/2013   Secondary to viral myocarditis   . Prediabetes    Family History  Problem Relation Age of Onset  . Coronary artery disease Mother   . Diabetes Mother   . ALS Mother   . Hyperlipidemia Father   . Breast cancer Paternal Grandmother   . Lung cancer Paternal Grandfather     CAD  . Other Daughter     gluten  . Down syndrome Son   . Colon cancer Maternal Grandfather   . Cancer Sister 20    breast  . Breast cancer Sister 35   Past Surgical History:  Procedure Laterality Date  . APPENDECTOMY  1974  . BREAST BIOPSY Right 1988   neg  . CARDIAC CATHETERIZATION  8/11   no significant -[facility]- Dr Rockey Situ,  . CHOLECYSTECTOMY  2002  . COLONOSCOPY  2013  . NEPHROLITHOTOMY Right 08/20/2014   Procedure: RIGHT PERCUTANEOUS  NEPHROLITHOTOMY ;  Surgeon: Malka So, MD;  Location: [facility];  Service: Urology;  Laterality: Right;  . TOTAL ABDOMINAL HYSTERECTOMY W/ BILATERAL SALPINGOOPHORECTOMY  2000   no ovaries, uterus, or cervix.   Patient Active Problem List   Diagnosis Date Noted  . Renal stone   . Obstructive sleep apnea 09/25/2013  . 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 NSTEMI (non-ST elevated myocardial infarction), 01/2010 [facility] 09/19/2013  . IBS (irritable bowel syndrome) 09/26/2012  . PARESTHESIA, HANDS 06/01/2010  . Hyperlipemia 04/01/2010  . 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 HYPERTRIGLYCERIDEMIA 07/21/2009  . Hypothyroidism 09/08/2008  . Major depressive disorder, recurrent episode, in full remission ([place]) 09/08/2008  . ASTHMA 09/08/2008      Prior to Admission medications   Medication Sig Start Date End Date Taking? Authorizing Provider  aspirin EC 81 MG tablet Take 81 mg by mouth daily.    Historical Provider, MD  fenofibrate (TRICOR) 145 MG tablet TAKE ONE TABLET BY MOUTH DAILY 03/30/16   Amy Cletis [place], MD  gabapentin (NEURONTIN) 300 MG capsule Take 300 mg by mouth at bedtime.     Historical Provider, MD  glucose blood (ACCU-CHEK AVIVA) test strip Use to check blood sugar once daily 03/02/16   Owens Loffler, MD  levothyroxine (SYNTHROID, LEVOTHROID) 75 MCG tablet Take 1 tablet (75 mcg total) by mouth daily before breakfast. 03/02/16   Frederico Hamman  [place], MD  magnesium gluconate (MAGONATE) 500 MG tablet Take 500 mg by mouth daily.     Historical Provider, MD  Multiple Vitamins-Minerals (CENTRUM SILVER) tablet Take 1 tablet by mouth daily.    Historical Provider, MD  nitroGLYCERIN (NITROSTAT) 0.4 MG SL tablet 1 tablet under tongue at onset of chest pain; you may repeat every 5 minutes for up to 3 doses. 03/02/16   Owens Loffler, MD  pravastatin (PRAVACHOL) 40 MG tablet TAKE 1 TABLET (40 MG TOTAL) BY MOUTH AT BEDTIME. 05/24/16   Owens Loffler, MD  sertraline (ZOLOFT) 50 MG tablet TAKE  ONE TABLET BY MOUTH DAILY 04/30/16   Owens Loffler, MD    Allergies Pseudoeph-doxylamine-dm-apap; Gluten; and Penicillins    Social History Social History  Substance Use Topics  . Smoking status: Former Smoker    Packs/day: 0.35    Years: 35.00    Types: Cigarettes    Quit date: 09/25/2008  . Smokeless tobacco: Never Used     Comment: quit in 2007-2008, social smoker  . Alcohol use Yes     Comment: rarely    Review of Systems Patient denies headaches, rhinorrhea, blurry vision, numbness, shortness of breath, chest pain, edema, cough, abdominal pain, nausea, vomiting, diarrhea, dysuria, fevers, rashes or hallucinations unless otherwise stated above in HPI. ____________________________________________   PHYSICAL EXAM:  VITAL SIGNS: Vitals:   06/22/16 2025 06/22/16 2200  BP: 136/60 140/63  Pulse: 70 70  Resp:  11  Temp: 98 F (36.7 C)    Constitutional: Alert and oriented. Well appearing and in no acute distress. Eyes: Conjunctivae are normal. PERRL. EOMI. Head: Atraumatic. Nose: No congestion/rhinnorhea. Mouth/Throat: Mucous membranes are moist.  Oropharynx non-erythematous. Neck: No stridor. Painless ROM. No cervical spine tenderness to palpation Hematological/Lymphatic/Immunilogical: No cervical lymphadenopathy. Cardiovascular: Normal rate, regular rhythm. Grossly normal heart sounds.  Good peripheral circulation. Respiratory: Normal respiratory effort.  No retractions. Lungs CTAB. Gastrointestinal: Soft and nontender. No distention. No abdominal bruits. No CVA tenderness. Genitourinary:  Musculoskeletal: No lower extremity tenderness nor edema.  No joint effusions. Neurologic:  Normal speech and language. No gross focal neurologic deficits are appreciated. No gait instability. Skin:  Skin is warm, dry and intact. No rash noted. Psychiatric: Mood and affect are normal. Speech and behavior are normal.  ____________________________________________   LABS (all  labs ordered are listed, but only abnormal results are displayed)  Results for orders placed or performed during the hospital encounter of 06/22/16 (from the past 24 hour(s))  Basic metabolic panel     Status: Abnormal   Collection Time: 06/22/16  4:56 PM  Result Value Ref Range   Sodium 138 135 - 145 mmol/L   Potassium 3.9 3.5 - 5.1 mmol/L   Chloride 105 101 - 111 mmol/L   CO2 27 22 - 32 mmol/L   Glucose, Bld 107 (H) 65 - 99 mg/dL   BUN 16 6 - 20 mg/dL   Creatinine, Ser 0.59 0.44 - 1.00 mg/dL   Calcium 9.4 8.9 - 10.3 mg/dL   GFR calc non Af Amer >60 >60 mL/min   GFR calc Af Amer >60 >60 mL/min   Anion gap 6 5 - 15  CBC     Status: None   Collection Time: 06/22/16  4:56 PM  Result Value Ref Range   WBC 9.0 3.6 - 11.0 K/uL   RBC 4.69 3.80 - 5.20 MIL/uL   Hemoglobin 12.8 12.0 - 16.0 g/dL   HCT 38.3 35.0 - 47.0 %   MCV 81.7 80.0 -  100.0 fL   MCH 27.3 26.0 - 34.0 pg   MCHC 33.4 32.0 - 36.0 g/dL   RDW 13.8 11.5 - 14.5 %   Platelets 279 150 - 440 K/uL  Troponin I     Status: None   Collection Time: 06/22/16  4:56 PM  Result Value Ref Range   Troponin I <0.03 <0.03 ng/mL   ____________________________________________  EKG My review and personal interpretation at Time: 16:50   Indication: chest pain  Rate: 70  Rhythm: sinus Axis: normal Other: nonspecific st and t wave changes that are unchanged from previous 06/21/15 ____________________________________________  RADIOLOGY  I personally reviewed all radiographic images ordered to evaluate for the above acute complaints and reviewed radiology reports and findings.  These findings were personally discussed with the patient.  Please see medical record for radiology report.  ____________________________________________   PROCEDURES  Procedure(s) performed:  Procedures    Critical Care performed: no ____________________________________________   INITIAL IMPRESSION / ASSESSMENT AND PLAN / ED COURSE  Pertinent labs &  imaging results that were available during my care of the patient were reviewed by me and considered in my medical decision making (see chart for details).  DDX: ACS, pericarditis, esophagitis, boerhaaves, pe, dissection, pna, bronchitis, costochondritis   CAITILIN HAVEN is a 61 y.o. who presents to the ED with atypical chest pain with left shoulder pain and left jaw pain for the past several days. Patient arrives afebrile and hemodynamically stable. There is no evidence of acute infectious process.  Her EKG is nonspecific and does not show any evidence of acute ischemia and is unchanged from previous just 2 days ago. Her initial troponin is negative. No exam findings to suggest acute bronchitis or COPD. Based on her dyspnea and age will order CT scan to evaluate for acute abnormality such as pulmonary embolism. We'll provide IV fluids as well as serial troponin  Clinical Course as of Jun 23 5  Fri Jun 23, 2016  0006 Spoke with Dr. Toney Rakes of cardiology regarding the patient's presentation. Given this being her second presentation to the ER for similar symptoms do feel best options for patient to be observed in the ER for chest pain rule out. Discussed this with the patient and she agrees to plan.  [PR]    Clinical Course User Index [PR] Merlyn Lot, MD     ____________________________________________   FINAL CLINICAL IMPRESSION(S) / ED DIAGNOSES  Final diagnoses:  Atypical chest pain  Acute pain of left shoulder      NEW MEDICATIONS STARTED DURING THIS VISIT:  New Prescriptions   No medications on file     Note:  This document was prepared using Dragon voice recognition software and may include unintentional dictation errors.    Merlyn Lot, MD 06/23/16 [phone]

## 2016-06-22 NOTE — ED Notes (Signed)
Patient transported to CT 

## 2016-06-22 NOTE — Telephone Encounter (Signed)
Pt has not been seen in our office since 2015. Looks like Dr. Humphrey Rolls consulted on 06/20/16 ED visit.  Made Dr. Rockey Situ aware of pt's call.

## 2016-06-23 ENCOUNTER — Observation Stay (HOSPITAL_BASED_OUTPATIENT_CLINIC_OR_DEPARTMENT_OTHER): Payer: 59

## 2016-06-23 ENCOUNTER — Encounter: Payer: Self-pay | Admitting: Internal Medicine

## 2016-06-23 DIAGNOSIS — R7303 Prediabetes: Secondary | ICD-10-CM

## 2016-06-23 DIAGNOSIS — R0789 Other chest pain: Secondary | ICD-10-CM

## 2016-06-23 DIAGNOSIS — F411 Generalized anxiety disorder: Secondary | ICD-10-CM

## 2016-06-23 DIAGNOSIS — R079 Chest pain, unspecified: Secondary | ICD-10-CM

## 2016-06-23 DIAGNOSIS — M25512 Pain in left shoulder: Secondary | ICD-10-CM

## 2016-06-23 DIAGNOSIS — E119 Type 2 diabetes mellitus without complications: Secondary | ICD-10-CM

## 2016-06-23 DIAGNOSIS — E782 Mixed hyperlipidemia: Secondary | ICD-10-CM | POA: Diagnosis not present

## 2016-06-23 DIAGNOSIS — I25119 Atherosclerotic heart disease of native coronary artery with unspecified angina pectoris: Secondary | ICD-10-CM | POA: Diagnosis not present

## 2016-06-23 DIAGNOSIS — F419 Anxiety disorder, unspecified: Secondary | ICD-10-CM

## 2016-06-23 LAB — NM MYOCAR MULTI W/SPECT W/WALL MOTION / EF
CHL CUP MPHR: 160 {beats}/min
CHL CUP NUCLEAR SDS: 1
CHL CUP NUCLEAR SRS: 0
CHL CUP NUCLEAR SSS: 1
CSEPEDS: 0 s
CSEPPHR: 113 {beats}/min
Estimated workload: 1 METS
Exercise duration (min): 0 min
LV dias vol: 39 mL (ref 46–106)
LVSYSVOL: 7 mL
NUC STRESS TID: 1
Percent HR: 70 %
Rest HR: 73 {beats}/min

## 2016-06-23 LAB — CBC
HCT: 35.6 % (ref 35.0–47.0)
HEMOGLOBIN: 11.9 g/dL — AB (ref 12.0–16.0)
MCH: 27.3 pg (ref 26.0–34.0)
MCHC: 33.3 g/dL (ref 32.0–36.0)
MCV: 82.2 fL (ref 80.0–100.0)
Platelets: 251 10*3/uL (ref 150–440)
RBC: 4.34 MIL/uL (ref 3.80–5.20)
RDW: 13.4 % (ref 11.5–14.5)
WBC: 8 10*3/uL (ref 3.6–11.0)

## 2016-06-23 LAB — BASIC METABOLIC PANEL
Anion gap: 6 (ref 5–15)
BUN: 16 mg/dL (ref 6–20)
CHLORIDE: 108 mmol/L (ref 101–111)
CO2: 25 mmol/L (ref 22–32)
CREATININE: 0.68 mg/dL (ref 0.44–1.00)
Calcium: 8.6 mg/dL — ABNORMAL LOW (ref 8.9–10.3)
GFR calc Af Amer: >60 mL/min (ref >60)
GFR calc non Af Amer: >60 mL/min (ref >60)
GLUCOSE: 99 mg/dL (ref 65–99)
Potassium: 3.5 mmol/L (ref 3.5–5.1)
Sodium: 139 mmol/L (ref 135–145)

## 2016-06-23 LAB — TROPONIN I

## 2016-06-23 LAB — LIPID PANEL
Cholesterol: 119 mg/dL (ref 0–200)
HDL: 37 mg/dL — ABNORMAL LOW (ref >40)
LDL CALC: 39 mg/dL (ref 0–99)
TRIGLYCERIDES: 213 mg/dL — AB (ref <150)
Total CHOL/HDL Ratio: 3.2 RATIO
VLDL: 43 mg/dL — AB (ref 0–40)

## 2016-06-23 MED ORDER — TECHNETIUM TC 99M TETROFOSMIN IV KIT
12.7000 | PACK | Freq: Once | INTRAVENOUS | Status: AC | PRN
  Administered 2016-06-23: 12.7 via INTRAVENOUS

## 2016-06-23 MED ORDER — FENOFIBRATE 54 MG PO TABS
54.0000 mg | ORAL_TABLET | Freq: Every day | ORAL | Status: DC
  Administered 2016-06-23: 54 mg via ORAL
  Filled 2016-06-23: qty 1

## 2016-06-23 MED ORDER — TECHNETIUM TC 99M TETROFOSMIN IV KIT
28.9730 | PACK | Freq: Once | INTRAVENOUS | Status: AC | PRN
  Administered 2016-06-23: 28.973 via INTRAVENOUS

## 2016-06-23 MED ORDER — ACETAMINOPHEN 325 MG PO TABS: 650.0000 mg | ORAL_TABLET | ORAL | Status: DC | PRN

## 2016-06-23 MED ORDER — SERTRALINE HCL 50 MG PO TABS
50.0000 mg | ORAL_TABLET | Freq: Every day | ORAL | Status: DC
  Administered 2016-06-23: 50 mg via ORAL
  Filled 2016-06-23: qty 1

## 2016-06-23 MED ORDER — REGADENOSON 0.4 MG/5ML IV SOLN
0.4000 mg | Freq: Once | INTRAVENOUS | Status: AC
  Administered 2016-06-23: 0.4 mg via INTRAVENOUS
  Filled 2016-06-23: qty 5

## 2016-06-23 MED ORDER — ENOXAPARIN SODIUM 40 MG/0.4ML ~~LOC~~ SOLN
40.0000 mg | SUBCUTANEOUS | Status: DC
  Administered 2016-06-23: 40 mg via SUBCUTANEOUS
  Filled 2016-06-23: qty 0.4

## 2016-06-23 MED ORDER — MORPHINE SULFATE (PF) 2 MG/ML IV SOLN
2.0000 mg | INTRAVENOUS | Status: DC | PRN
Start: 2016-06-23 — End: 2016-06-23

## 2016-06-23 MED ORDER — ONDANSETRON HCL 4 MG/2ML IJ SOLN: 4.0000 mg | Freq: Four times a day (QID) | INTRAMUSCULAR | Status: DC | PRN

## 2016-06-23 MED ORDER — LEVOTHYROXINE SODIUM 75 MCG PO TABS
75.0000 ug | ORAL_TABLET | Freq: Every day | ORAL | Status: DC
  Administered 2016-06-23: 75 ug via ORAL
  Filled 2016-06-23: qty 1

## 2016-06-23 MED ORDER — PANTOPRAZOLE SODIUM 40 MG PO TBEC: 40.0000 mg | DELAYED_RELEASE_TABLET | Freq: Every day | ORAL | 0 refills | Status: DC

## 2016-06-23 MED ORDER — GABAPENTIN 300 MG PO CAPS: 300.0000 mg | ORAL_CAPSULE | Freq: Every day | ORAL | Status: DC

## 2016-06-23 MED ORDER — ASPIRIN 81 MG PO CHEW
324.0000 mg | CHEWABLE_TABLET | Freq: Once | ORAL | Status: AC
  Administered 2016-06-23: 324 mg via ORAL
  Filled 2016-06-23: qty 4

## 2016-06-23 MED ORDER — NITROGLYCERIN 0.4 MG SL SUBL: 0.4000 mg | SUBLINGUAL_TABLET | SUBLINGUAL | Status: DC | PRN

## 2016-06-23 MED ORDER — NITROGLYCERIN 0.4 MG SL SUBL: SUBLINGUAL_TABLET | SUBLINGUAL | 0 refills | Status: DC

## 2016-06-23 MED ORDER — ASPIRIN EC 81 MG PO TBEC: 81.0000 mg | DELAYED_RELEASE_TABLET | Freq: Every day | ORAL | Status: DC

## 2016-06-23 MED ORDER — SODIUM CHLORIDE 0.9% FLUSH
3.0000 mL | Freq: Two times a day (BID) | INTRAVENOUS | Status: DC
  Administered 2016-06-23: 3 mL via INTRAVENOUS

## 2016-06-23 MED ORDER — ASPIRIN 81 MG PO CHEW: 324.0000 mg | CHEWABLE_TABLET | ORAL | Status: DC

## 2016-06-23 MED ORDER — PRAVASTATIN SODIUM 20 MG PO TABS
40.0000 mg | ORAL_TABLET | Freq: Every day | ORAL | Status: DC
  Administered 2016-06-23: 40 mg via ORAL
  Filled 2016-06-23: qty 2

## 2016-06-23 MED ORDER — ADULT MULTIVITAMIN W/MINERALS CH
1.0000 | ORAL_TABLET | Freq: Every day | ORAL | Status: DC
  Administered 2016-06-23: 1 via ORAL
  Filled 2016-06-23: qty 1

## 2016-06-23 MED ORDER — ASPIRIN 300 MG RE SUPP: 300.0000 mg | RECTAL | Status: DC

## 2016-06-23 MED ORDER — SODIUM CHLORIDE 0.9% FLUSH: 3.0000 mL | INTRAVENOUS | Status: DC | PRN

## 2016-06-23 MED ORDER — SODIUM CHLORIDE 0.9 % IV SOLN: 250.0000 mL | INTRAVENOUS | Status: DC | PRN

## 2016-06-23 MED ORDER — ASPIRIN EC 81 MG PO TBEC
81.0000 mg | DELAYED_RELEASE_TABLET | Freq: Every day | ORAL | Status: DC
  Administered 2016-06-23: 81 mg via ORAL
  Filled 2016-06-23: qty 1

## 2016-06-23 MED ORDER — MAGNESIUM GLUCONATE 500 MG PO TABS
500.0000 mg | ORAL_TABLET | Freq: Every day | ORAL | Status: DC
  Filled 2016-06-23: qty 1

## 2016-06-23 NOTE — Discharge Instructions (Signed)
Nitroglycerin sublingual tablets What is this medicine? NITROGLYCERIN (nye troe GLI ser in) is a type of vasodilator. It relaxes blood vessels, increasing the blood and oxygen supply to your heart. This medicine is used to relieve chest pain caused by angina. It is also used to prevent chest pain before activities like climbing stairs, going outdoors in cold weather, or sexual activity. This medicine may be used for other purposes; ask your health care provider or pharmacist if you have questions. COMMON BRAND NAME(S): Nitroquick, Nitrostat, Nitrotab What should I tell my health care provider before I take this medicine? They need to know if you have any of these conditions: -anemia -head injury, recent stroke, or bleeding in the brain -liver disease -previous heart attack -an unusual or allergic reaction to nitroglycerin, other medicines, foods, dyes, or preservatives -pregnant or trying to get pregnant -breast-feeding How should I use this medicine? Take this medicine by mouth as needed. At the first sign of an angina attack (chest pain or tightness) place one tablet under your tongue. You can also take this medicine 5 to 10 minutes before an event likely to produce chest pain. Follow the directions on the prescription label. Let the tablet dissolve under the tongue. Do not swallow whole. Replace the dose if you accidentally swallow it. It will help if your mouth is not dry. Saliva around the tablet will help it to dissolve more quickly. Do not eat or drink, smoke or chew tobacco while a tablet is dissolving. If you are not better within 5 minutes after taking ONE dose of nitroglycerin, call 9-1-1 immediately to seek emergency medical care. Do not take more than 3 nitroglycerin tablets over 15 minutes. If you take this medicine often to relieve symptoms of angina, your doctor or health care professional may provide you with different instructions to manage your symptoms. If symptoms do not go away  after following these instructions, it is important to call 9-1-1 immediately. Do not take more than 3 nitroglycerin tablets over 15 minutes. Talk to your pediatrician regarding the use of this medicine in children. Special care may be needed. Overdosage: If you think you have taken too much of this medicine contact a poison control center or emergency room at once. NOTE: This medicine is only for you. Do not share this medicine with others. What if I miss a dose? This does not apply. This medicine is only used as needed. What may interact with this medicine? Do not take this medicine with any of the following medications: -certain migraine medicines like ergotamine and dihydroergotamine (DHE) -medicines used to treat erectile dysfunction like sildenafil, tadalafil, and vardenafil -riociguat This medicine may also interact with the following medications: -alteplase -aspirin -heparin -medicines for high blood pressure -medicines for mental depression -other medicines used to treat angina -phenothiazines like chlorpromazine, mesoridazine, prochlorperazine, thioridazine This list may not describe all possible interactions. Give your health care provider a list of all the medicines, herbs, non-prescription drugs, or dietary supplements you use. Also tell them if you smoke, drink alcohol, or use illegal drugs. Some items may interact with your medicine. What should I watch for while using this medicine? Tell your doctor or health care professional if you feel your medicine is no longer working. Keep this medicine with you at all times. Sit or lie down when you take your medicine to prevent falling if you feel dizzy or faint after using it. Try to remain calm. This will help you to feel better faster. If you feel  dizzy, take several deep breaths and lie down with your feet propped up, or bend forward with your head resting between your knees. You may get drowsy or dizzy. Do not drive, use machinery,  or do anything that needs mental alertness until you know how this drug affects you. Do not stand or sit up quickly, especially if you are an older patient. This reduces the risk of dizzy or fainting spells. Alcohol can make you more drowsy and dizzy. Avoid alcoholic drinks. Do not treat yourself for coughs, colds, or pain while you are taking this medicine without asking your doctor or health care professional for advice. Some ingredients may increase your blood pressure. What side effects may I notice from receiving this medicine? Side effects that you should report to your doctor or health care professional as soon as possible: -blurred vision -dry mouth -skin rash -sweating -the feeling of extreme pressure in the head -unusually weak or tired Side effects that usually do not require medical attention (report to your doctor or health care professional if they continue or are bothersome): -flushing of the face or neck -headache -irregular heartbeat, palpitations -nausea, vomiting This list may not describe all possible side effects. Call your doctor for medical advice about side effects. You may report side effects to FDA at 1-800-FDA-1088. Where should I keep my medicine? Keep out of the reach of children. Store at room temperature between 20 and 25 degrees C (68 and 77 degrees F). Store in [employer]. Protect from light and moisture. Keep tightly closed. Throw away any unused medicine after the expiration date. NOTE: This sheet is a summary. It may not cover all possible information. If you have questions about this medicine, talk to your doctor, pharmacist, or health care provider.  2017 Elsevier/Gold Standard (2013-04-03 17:57:36) Pantoprazole tablets What is this medicine? PANTOPRAZOLE (pan TOE pra zole) prevents the production of acid in the stomach. It is used to treat gastroesophageal reflux disease (GERD), inflammation of the esophagus, and Zollinger-Ellison syndrome. This  medicine may be used for other purposes; ask your health care provider or pharmacist if you have questions. COMMON BRAND NAME(S): Protonix What should I tell my health care provider before I take this medicine? They need to know if you have any of these conditions: -liver disease -low levels of magnesium in the blood -lupus -an unusual or allergic reaction to omeprazole, lansoprazole, pantoprazole, rabeprazole, other medicines, foods, dyes, or preservatives -pregnant or trying to get pregnant -breast-feeding How should I use this medicine? Take this medicine by mouth. Swallow the tablets whole with a drink of water. Follow the directions on the prescription label. Do not crush, break, or chew. Take your medicine at regular intervals. Do not take your medicine more often than directed. Talk to your pediatrician regarding the use of this medicine in children. While this drug may be prescribed for children as young as 5 years for selected conditions, precautions do apply. Overdosage: If you think you have taken too much of this medicine contact a poison control center or emergency room at once. NOTE: This medicine is only for you. Do not share this medicine with others. What if I miss a dose? If you miss a dose, take it as soon as you can. If it is almost time for your next dose, take only that dose. Do not take double or extra doses. What may interact with this medicine? Do not take this medicine with any of the following medications: -atazanavir -nelfinavir This medicine may also  interact with the following medications: -ampicillin -delavirdine -erlotinib -iron salts -medicines for fungal infections like ketoconazole, itraconazole and voriconazole -methotrexate -mycophenolate mofetil -warfarin This list may not describe all possible interactions. Give your health care provider a list of all the medicines, herbs, non-prescription drugs, or dietary supplements you use. Also tell them if  you smoke, drink alcohol, or use illegal drugs. Some items may interact with your medicine. What should I watch for while using this medicine? It can take several days before your stomach pain gets better. Check with your doctor or health care professional if your condition does not start to get better, or if it gets worse. You may need blood work done while you are taking this medicine. What side effects may I notice from receiving this medicine? Side effects that you should report to your doctor or health care professional as soon as possible: -allergic reactions like skin rash, itching or hives, swelling of the face, lips, or tongue -bone, muscle or joint pain -breathing problems -chest pain or chest tightness -dark yellow or brown urine -dizziness -fast, irregular heartbeat -feeling faint or lightheaded -fever or sore throat -muscle spasm -palpitations -rash on cheeks or arms that gets worse in the sun -redness, blistering, peeling or loosening of the skin, including inside the mouth -seizures -tremors -unusual bleeding or bruising -unusually weak or tired -yellowing of the eyes or skin Side effects that usually do not require medical attention (report to your doctor or health care professional if they continue or are bothersome): -constipation -diarrhea -dry mouth -headache -nausea This list may not describe all possible side effects. Call your doctor for medical advice about side effects. You may report side effects to FDA at 1-800-FDA-1088. Where should I keep my medicine? Keep out of the reach of children. Store at room temperature between 15 and 30 degrees C (59 and 86 degrees F). Protect from light and moisture. Throw away any unused medicine after the expiration date. NOTE: This sheet is a summary. It may not cover all possible information. If you have questions about this medicine, talk to your doctor, pharmacist, or health care provider.  2017 Elsevier/Gold Standard  (2015-07-08 12:20:19) Nonspecific Chest Pain Chest pain can be caused by many different conditions. There is a chance that your pain could be related to something serious, such as a heart attack or a blood clot in your lungs. Chest pain can also be caused by conditions that are not life-threatening. If you have chest pain, it is very important to follow up with your doctor. Follow these instructions at home: Medicines  If you were prescribed an antibiotic medicine, take it as told by your doctor. Do not stop taking the antibiotic even if you start to feel better.  Take over-the-counter and prescription medicines only as told by your doctor. Lifestyle  Do not use any products that contain nicotine or tobacco, such as cigarettes and e-cigarettes. If you need help quitting, ask your doctor.  Do not drink alcohol.  Make lifestyle changes as told by your doctor. These may include:  Getting regular exercise. Ask your doctor for some activities that are safe for you.  Eating a heart-healthy diet. A diet specialist (dietitian) can help you to learn healthy eating options.  Staying at a healthy weight.  Managing diabetes, if needed.  Lowering your stress, as with deep breathing or spending time in nature. General instructions  Avoid any activities that make you feel chest pain.  If your chest pain is because of heartburn:  Raise (elevate) the head of your bed about 6 inches (15 cm). You can do this by putting blocks under the bed legs at the head of the bed.  Do not sleep with extra pillows under your head. That does not help heartburn.  Keep all follow-up visits as told by your doctor. This is important. This includes any further testing if your chest pain does not go away. Contact a doctor if:  Your chest pain does not go away.  You have a rash with blisters on your chest.  You have a fever.  You have chills. Get help right away if:  Your chest pain is worse.  You have a  cough that gets worse, or you cough up blood.  You have very bad (severe) pain in your belly (abdomen).  You are very weak.  You pass out (faint).  You have either of these for no clear reason:  Sudden chest discomfort.  Sudden discomfort in your arms, back, neck, or jaw.  You have shortness of breath at any time.  You suddenly start to sweat, or your skin gets clammy.  You feel sick to your stomach (nauseous).  You throw up (vomit).  You suddenly feel light-headed or dizzy.  Your heart starts to beat fast, or it feels like it is skipping beats. These symptoms may be an emergency. Do not wait to see if the symptoms will go away. Get medical help right away. Call your local emergency services (911 in the [place] Do not drive yourself to the hospital.  This information is not intended to replace advice given to you by your health care provider. Make sure you discuss any questions you have with your health care provider. Document Released: 11/22/2007 Document Revised: 02/28/2016 Document Reviewed: 02/28/2016 Elsevier Interactive Patient Education  2017 [employer].

## 2016-06-23 NOTE — Consult Note (Signed)
Cardiology Consult    Patient ID: Brandi Mahoney MRN: [id number], DOB/AGE: Sep 27, 1955   Admit date: 06/22/2016 Date of Consult: 06/23/2016  Primary Physician: Owens Loffler, MD Primary Cardiologist: Johnny Bridge, MD  Requesting Provider: Chauncey Cruel. Posey Pronto, MD  Patient Profile    61 y/o ? with a h/o chest pain, DOE, myocarditis, and nonobs CAD, who was admitted 1/4 due to ongoing c/p over the past week.  Past Medical History   Past Medical History:  Diagnosis Date  . Asthma   . CAD (coronary artery disease)    a. 01/2010 Cath: mild atherosclerosis in the mid LCX after takeoff of OM1, mild diff LAD dzs;  b. 07/2013 MV: EF 67%, no ischemia.  . Depression   . Hemorrhoids   . History of pneumonia 1969  . HLD (hyperlipidemia)   . Hypothyroidism   . IBS (irritable bowel syndrome)   . Migraine headache   . Myocarditis ([place])    a. 01/2010 presented w/ c/p and elev trop-->Cath: mild atherosclerosis in the mid LCX after takeoff of OM1, mild diff LAD dzs;  b. 03/2010 Echo: EF 60-65%, DD, mild LVH;  c. 07/2011 Echo: EF 55-60%, mild LVH, no rwma.  . Prediabetes     Past Surgical History:  Procedure Laterality Date  . APPENDECTOMY  1974  . BREAST BIOPSY Right 1988   neg  . CARDIAC CATHETERIZATION  8/11   no significant -[facility]- Dr Rockey Situ,  . CHOLECYSTECTOMY  2002  . COLONOSCOPY  2013  . NEPHROLITHOTOMY Right 08/20/2014   Procedure: RIGHT PERCUTANEOUS NEPHROLITHOTOMY ;  Surgeon: Malka So, MD;  Location: [facility];  Service: Urology;  Laterality: Right;  . TOTAL ABDOMINAL HYSTERECTOMY W/ BILATERAL SALPINGOOPHORECTOMY  2000   no ovaries, uterus, or cervix.     Allergies  Allergies  Allergen Reactions  . Pseudoeph-Doxylamine-Dm-Apap Anaphylaxis    Allergy to nyquil  . Gluten Other (See Comments)    Gi upset  . Penicillins Itching and Rash    History of Present Illness    60 y/o ? with a h/o HL, hypothyroidism, IBS, chest pain, DOE, and myocarditis.  In 01/2010, she was admitted to [facility]  with chest pain and troponin elevation.  Initially it was felt that this represented ACS.  Cath was performed and revealed mild LCX and LAD dzs.  Ultimately, it was felt the presentation was most consistent with viral myocarditis. Echo showed normal LV function. She subsequently recovered. In 2015, she was having chest pain and dyspnea and saw Dr. Rockey Situ. Stress testing was performed and was low risk. She has not been seen in cardiology clinic since 2015. She says she has been doing reasonably well but has some degree of chronic dyspnea on exertion, especially with inclines or higher levels of activity. Over the past 6-8 weeks, she has also noticed exertional left shoulder and upper chest discomfort associated with dyspnea, lasting a few minutes, and resolving with rest.  On January 2, she began to experience intermittent, fleeting sharp and shooting midsternal chest pain without associated symptoms. This occurred over several hours prompting her to present to the ED at [facility]. There, ECG was nonacute and troponins were negative. She was discharged. She continued to feel poorly between Tuesday evening and Wednesday, January 3. On January 4, she was sitting at work and developed left shoulder discomfort which radiated to her jaw. This persisted for approximately 3 or 4 hours while at work. She left work a little bit early and then went home and her  husband drove to the ED. Here, again, ECG was nonacute and troponin was normal. CTA of the chest was performed and was negative for pulmonary embolus. Aortic atherosclerosis sclerosis and coronary atherosclerosis were noted. Since admission, troponins have remained normal. She has had no events on telemetry and vital signs have been stable. She notes a mild, vague left shoulder discomfort at this point. She denies PND, orthopnea, dizziness, syncope, edema, or early satiety. Chest, shoulder, and jaw symptoms are not reproducible with palpation, deep breathing,  or position changes.  Inpatient Medications    . aspirin  324 mg Oral NOW   Or  . aspirin  300 mg Rectal NOW  . aspirin EC  81 mg Oral Daily  . [START ON 06/24/2016] aspirin EC  81 mg Oral Daily  . enoxaparin (LOVENOX) injection  40 mg Subcutaneous Q24H  . fenofibrate  54 mg Oral Daily  . gabapentin  300 mg Oral QHS  . levothyroxine  75 mcg Oral QAC breakfast  . magnesium gluconate  500 mg Oral Daily  . multivitamin with minerals  1 tablet Oral Daily  . pravastatin  40 mg Oral Daily  . promethazine  12.5 mg Intravenous Once  . regadenoson  0.4 mg Intravenous Once  . sertraline  50 mg Oral Daily  . sodium chloride flush  3 mL Intravenous Q12H    Family History    Family History  Problem Relation Age of Onset  . Coronary artery disease Mother   . Diabetes Mother   . ALS Mother   . Hyperlipidemia Father   . Breast cancer Paternal Grandmother   . Lung cancer Paternal Grandfather     CAD  . Other Daughter     gluten  . Down syndrome Son   . Colon cancer Maternal Grandfather   . Cancer Sister 40    breast  . Breast cancer Sister 57    Social History    Social History   Social History  . Marital status: Married    Spouse name: Nicole Kindred  . Number of children: 3  . Years of education: N/A   Occupational History  . 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  [employer] Mosher.    Social History Main Topics  . Smoking status: Former Smoker    Packs/day: 0.35    Years: 35.00    Types: Cigarettes    Quit date: 09/25/2008  . Smokeless tobacco: Never Used     Comment: quit in 2007-2008, social smoker  . Alcohol use Yes     Comment: rarely  . Drug use: No  . Sexual activity: Not on file   Other Topics Concern  . Not on file   Social History Narrative   Lives in [place] w/ husband.  No reg exercise.     Review of Systems    General:  No chills, fever, night sweats or weight changes.  Cardiovascular:  +++ chest, left shoulder, and jaw pain, +++  chronic dyspnea on exertion, no edema, orthopnea, palpitations, paroxysmal nocturnal dyspnea. Dermatological: No rash, lesions/masses Respiratory: No cough, +++ dyspnea Urologic: No hematuria, dysuria Abdominal:   No nausea, vomiting, diarrhea, bright red blood per rectum, melena, or hematemesis Neurologic:  No visual changes, wkns, changes in mental status. All other systems reviewed and are otherwise negative except as noted above.  Physical Exam    Blood pressure 122/64, pulse 66, temperature 98.1 F (36.7 C), temperature source Oral, resp. rate 16, height 5'  4" (1.626 m), weight 175 lb 1.6 oz (79.4 kg), SpO2 94 %.  General: Pleasant, NAD Psych: Normal affect. Neuro: Alert and oriented X 3. Moves all extremities spontaneously. HEENT: Normal  Neck: Supple without bruits or JVD. Lungs:  Resp regular and unlabored, CTA. Heart: RRR no s3, s4, or murmurs. Abdomen: Soft, non-tender, non-distended, BS + x 4.  Extremities: No clubbing, cyanosis or edema. DP/PT/Radials 2+ and equal bilaterally.  Labs     Recent Labs  06/20/16 1958 06/22/16 1656 06/22/16 2245 06/23/16 0515  TROPONINI <0.03 <0.03 <0.03 <0.03   Lab Results  Component Value Date   WBC 8.0 06/23/2016   HGB 11.9 (L) 06/23/2016   HCT 35.6 06/23/2016   MCV 82.2 06/23/2016   PLT 251 06/23/2016     Recent Labs Lab 06/23/16 0515  NA 139  K 3.5  CL 108  CO2 25  BUN 16  CREATININE 0.68  CALCIUM 8.6*  GLUCOSE 99   Lab Results  Component Value Date   CHOL 119 06/23/2016   HDL 37 (L) 06/23/2016   LDLCALC 39 06/23/2016   TRIG 213 (H) 06/23/2016     Radiology Studies    Dg Chest 2 View  Result Date: 06/22/2016 CLINICAL DATA:  Chest pain.  Shortness of breath. EXAM: CHEST  2 VIEW COMPARISON:  June 20, 2016 FINDINGS: Minimal atelectasis in the left base. The heart, hila, mediastinum, lungs, and pleura are normal. IMPRESSION: No active cardiopulmonary disease. Electronically Signed   By: Dorise Bullion III  M.D   On: 06/22/2016 17:26   Dg Chest 2 View  Result Date: 06/20/2016 CLINICAL DATA:  Left-sided chest pain with arm tingling EXAM: CHEST  2 VIEW COMPARISON:  08/05/2013 FINDINGS: Minimal scar or atelectasis at the lingula. No acute infiltrate or effusion. Cardiomediastinal silhouette within normal limits. No pneumothorax. Surgical clips in the right upper quadrant. IMPRESSION: No active cardiopulmonary disease. Electronically Signed   By: Donavan Foil M.D.   On: 06/20/2016 18:58   Ct Angio Chest Pe W And/or Wo Contrast  Result Date: 06/22/2016 CLINICAL DATA:  Left shoulder and fell pain radiating the arm. EXAM: CT ANGIOGRAPHY CHEST WITH CONTRAST TECHNIQUE: Multidetector CT imaging of the chest was performed using the standard protocol during bolus administration of intravenous contrast. Multiplanar CT image reconstructions and MIPs were obtained to evaluate the vascular anatomy. CONTRAST:  75 cc of Isovue 370 IV COMPARISON:  CT abdomen and pelvis from 08/23/2014, CXR 06/22/2016 FINDINGS: Cardiovascular: The study is of quality for the evaluation of pulmonary embolism. There are no filling defects in the central, lobar, segmental or subsegmental pulmonary artery branches to suggest acute pulmonary embolism. Great vessels are normal in course and caliber. Normal heart size. No significant pericardial fluid/thickening. No aortic aneurysm or dissection. Aortic atherosclerosis and coronary arteriosclerosis. Mediastinum/Nodes: No discrete thyroid nodules. Unremarkable esophagus. No pathologically enlarged axillary, mediastinal or hilar lymph nodes. Patent trachea and mainstem bronchi. Lungs/Pleura: No pneumothorax. No pleural effusion. Bibasilar dependent atelectasis. Upper abdomen: Partially calcified splenic cyst measuring 4.8 cm in diameter is stable since 2016 comparison. Hepatic steatosis is noted without space-occupying mass. No acute upper abdominal abnormality. Musculoskeletal:  No aggressive appearing  focal osseous lesions. Review of the MIP images confirms the above findings. IMPRESSION: No acute pulmonary embolus, aortic aneurysm or dissection. Aortic atherosclerosis and coronary arteriosclerosis. Hepatic steatosis with stable partially calcified splenic cyst. Electronically Signed   By: Ashley Royalty M.D.   On: 06/22/2016 23:18    ECG & Cardiac Imaging  RSR, 72, LAE, incomplete RBBB, mild ST elev in V2, no acute changes.  Assessment & Plan    1. Chest/left shoulder/left jaw pain/Nonobstructive CAD: Patient presents with a long history of dyspnea on exertion and a 6-8 week history of progressive exertional left shoulder discomfort that resolves with rest. She does have a prior history of myocarditis diagnosed in 2011 at which time, she had elevated troponins and nonobstructive disease on cath. She was in the ED on January 2 with fleeting, sharp, and shooting chest pain without objective evidence of ischemia. She returned to the ED on January 4 secondary to left shoulder and jaw discomfort that persisted approximately 3-4 hours prior to presentation and then another 3 or 4 hours after presentation. Symptoms eventually eased off with morphine though persist at a low level this morning. Despite prolonged symptoms, troponin is normal and ECG is nonacute. CTA of the chest was negative for PE. Aortic and coronary atherosclerosis were noted.  Symptoms are not reproducible with palpation, position changes, or deep breathing. Given prior history of nonobstructive CAD with exertional symptoms recently, we will arrange for a Lexiscan Myoview today. If nonischemic, she may be safely discharged this afternoon without further cardiac workup.   2. Dyspnea on exertion: Patient reports a long history of dyspnea on exertion which has progressed somewhat over the years. Echo pending. Stress testing as above.  3. Hyperlipidemia: LDL 39 on Pravachol. LFTs were mildly elevated in September.  4. Borderline Diabetes  mellitus: Hemoglobin A1c is 6.5. Fasting glucose this morning was 99. She will benefit from outpatient attritional counseling and weight loss.  Signed, Murray Hodgkins, NP 06/23/2016, 10:08 AM

## 2016-06-23 NOTE — Discharge Summary (Signed)
[place] at [facility], [place] y.o., DOB 07-10-1955, MRN [id number]. Admission date: 06/22/2016 Discharge Date 06/23/2016 Primary MD Owens Loffler, MD Admitting Physician Saundra Shelling, MD  Admission Diagnosis  Atypical chest pain [R07.89] Acute pain of left shoulder [M25.512]  Discharge Diagnosis   Active Problems:   Chest pain noncardiac status post stress test which was negative   Acute pain of left shoulder  Borderline diabetes   Anxiety          Hospital Course  Patient is a 61 year old female with history of bronchial asthma hyperlipidemia hypothyroidism, myocarditis who presented with complaint of chest pain since Tuesday. Patient was seen in the ED on Tuesday and discharged home. Due to cardiac enzymes and EKG being negative. She continued to have symptoms that came back to the ED. Had a CT of the chest which was negative. Again her cardiac enzymes were negative. She was seen by cardiology who performed a stress test felt that her symptoms were very atypical. And recommended discharge. If patient continues to have symptoms she'll need follow-up with cardiology. May need GI evaluation as well. Currently she is stable for discharge.           Consults  cardiology  Significant Tests:  See full reports for all details     Dg Chest 2 View  Result Date: 06/22/2016 CLINICAL DATA:  Chest pain.  Shortness of breath. EXAM: CHEST  2 VIEW COMPARISON:  June 20, 2016 FINDINGS: Minimal atelectasis in the left base. The heart, hila, mediastinum, lungs, and pleura are normal. IMPRESSION: No active cardiopulmonary disease. Electronically Signed   By: Dorise Bullion III M.D   On: 06/22/2016 17:26   Dg Chest 2 View  Result Date: 06/20/2016 CLINICAL DATA:  Left-sided chest pain with arm tingling EXAM: CHEST  2 VIEW COMPARISON:  08/05/2013 FINDINGS: Minimal scar or atelectasis at the lingula. No acute infiltrate or effusion. Cardiomediastinal  silhouette within normal limits. No pneumothorax. Surgical clips in the right upper quadrant. IMPRESSION: No active cardiopulmonary disease. Electronically Signed   By: Donavan Foil M.D.   On: 06/20/2016 18:58   Ct Angio Chest Pe W And/or Wo Contrast  Result Date: 06/22/2016 CLINICAL DATA:  Left shoulder and fell pain radiating the arm. EXAM: CT ANGIOGRAPHY CHEST WITH CONTRAST TECHNIQUE: Multidetector CT imaging of the chest was performed using the standard protocol during bolus administration of intravenous contrast. Multiplanar CT image reconstructions and MIPs were obtained to evaluate the vascular anatomy. CONTRAST:  75 cc of Isovue 370 IV COMPARISON:  CT abdomen and pelvis from 08/23/2014, CXR 06/22/2016 FINDINGS: Cardiovascular: The study is of quality for the evaluation of pulmonary embolism. There are no filling defects in the central, lobar, segmental or subsegmental pulmonary artery branches to suggest acute pulmonary embolism. Great vessels are normal in course and caliber. Normal heart size. No significant pericardial fluid/thickening. No aortic aneurysm or dissection. Aortic atherosclerosis and coronary arteriosclerosis. Mediastinum/Nodes: No discrete thyroid nodules. Unremarkable esophagus. No pathologically enlarged axillary, mediastinal or hilar lymph nodes. Patent trachea and mainstem bronchi. Lungs/Pleura: No pneumothorax. No pleural effusion. Bibasilar dependent atelectasis. Upper abdomen: Partially calcified splenic cyst measuring 4.8 cm in diameter is stable since 2016 comparison. Hepatic steatosis is noted without space-occupying mass. No acute upper abdominal abnormality. Musculoskeletal:  No aggressive appearing focal osseous lesions. Review of the MIP images confirms the above findings. IMPRESSION: No acute pulmonary embolus, aortic aneurysm or dissection. Aortic atherosclerosis and coronary arteriosclerosis. Hepatic steatosis with stable partially calcified  splenic cyst. Electronically  Signed   By: Ashley Royalty M.D.   On: 06/22/2016 23:18   Nm Myocar Multi W/spect W/wall Motion / Ef  Result Date: 06/23/2016 Pharmacological myocardial perfusion imaging study with no significant  ischemia Normal wall motion, EF estimated at 69% No EKG changes concerning for ischemia at peak stress or in recovery. Low risk scan Signed, Esmond Plants, MD, Ph.D [facility]       Today   Subjective:   Brandi Mahoney patient denies any chest pain or shortness of breath  Objective:   Blood pressure 122/64, pulse 66, temperature 98.1 F (36.7 C), temperature source Oral, resp. rate 16, height 5\' 4"  (1.626 m), weight 175 lb 1.6 oz (79.4 kg), SpO2 94 %.  .  Intake/Output Summary (Last 24 hours) at 06/23/16 1703 Last data filed at 06/23/16 0459  Gross per 24 hour  Intake             1000 ml  Output                0 ml  Net             1000 ml    Exam VITAL SIGNS: Blood pressure 122/64, pulse 66, temperature 98.1 F (36.7 C), temperature source Oral, resp. rate 16, height 5\' 4"  (1.626 m), weight 175 lb 1.6 oz (79.4 kg), SpO2 94 %.  GENERAL:  61 y.o.-year-old patient lying in the bed with no acute distress.  EYES: Pupils equal, round, reactive to light and accommodation. No scleral icterus. Extraocular muscles intact.  HEENT: Head atraumatic, normocephalic. Oropharynx and nasopharynx clear.  NECK:  Supple, no jugular venous distention. No thyroid enlargement, no tenderness.  LUNGS: Normal breath sounds bilaterally, no wheezing, rales,rhonchi or crepitation. No use of accessory muscles of respiration.  CARDIOVASCULAR: S1, S2 normal. No murmurs, rubs, or gallops.  ABDOMEN: Soft, nontender, nondistended. Bowel sounds present. No organomegaly or mass.  EXTREMITIES: No pedal edema, cyanosis, or clubbing.  NEUROLOGIC: Cranial nerves II through XII are intact. Muscle strength 5/5 in all extremities. Sensation intact. Gait not checked.  PSYCHIATRIC: The patient is alert and oriented x 3.  SKIN: No  obvious rash, lesion, or ulcer.   Data Review     CBC w Diff: Lab Results  Component Value Date   WBC 8.0 06/23/2016   HGB 11.9 (L) 06/23/2016   HGB 13.3 08/05/2013   HCT 35.6 06/23/2016   HCT 39.3 08/05/2013   PLT 251 06/23/2016   PLT 267 08/05/2013   LYMPHOPCT 27.7 03/01/2016   MONOPCT 7.2 03/01/2016   EOSPCT 2.7 03/01/2016   BASOPCT 0.5 03/01/2016   CMP: Lab Results  Component Value Date   NA 139 06/23/2016   NA 139 08/05/2013   K 3.5 06/23/2016   K 3.7 08/05/2013   CL 108 06/23/2016   CL 108 (H) 08/05/2013   CO2 25 06/23/2016   CO2 24 08/05/2013   BUN 16 06/23/2016   BUN 15 08/05/2013   CREATININE 0.68 06/23/2016   CREATININE 0.63 08/05/2013   PROT 6.8 03/01/2016   PROT 7.0 04/04/2012   ALBUMIN 4.3 03/01/2016   ALBUMIN 4.2 04/04/2012   BILITOT 0.4 03/01/2016   BILITOT 0.3 04/04/2012   ALKPHOS 74 03/01/2016   ALKPHOS 76 04/04/2012   AST 40 (H) 03/01/2016   AST 29 04/04/2012   ALT 50 (H) 03/01/2016   ALT 40 04/04/2012  .  Micro Results No results found for this or any previous visit (from the past 240  hour(s)).      Code Status Orders        Start     Ordered   06/23/16 0450  Full code  Continuous     06/23/16 0449    Code Status History    Date Active Date Inactive Code Status Order ID Comments User Context   08/24/2014 11:48 AM 08/25/2014  2:40 PM Full Code [id number]  Sandi Mariscal, MD Inpatient   08/20/2014 11:04 AM 08/24/2014 11:48 AM Full Code [id number]  Malka So, MD Inpatient   08/19/2014  3:19 PM 08/20/2014  3:44 AM Full Code [id number]  Jacqulynn Cadet, MD HOV    Advance Directive Documentation   Flowsheet Row Most Recent Value  Type of Advance Directive  Living will  Pre-existing out of facility DNR order (yellow form or pink MOST form)  No data  "MOST" Form in Place?  No data          Follow-up Information    [facility] Follow up.   Specialty:  Radiology Contact information: [address] [phone] [place] [phone]          Discharge Medications   Allergies as of 06/23/2016      Reactions   Pseudoeph-doxylamine-dm-apap Anaphylaxis   Allergy to nyquil   Gluten Other (See Comments)   Gi upset   Penicillins Itching, Rash      Medication List    TAKE these medications   aspirin EC 81 MG tablet Take 81 mg by mouth daily.   CENTRUM SILVER tablet Take 1 tablet by mouth daily.   fenofibrate 145 MG tablet Commonly known as:  TRICOR TAKE ONE TABLET BY MOUTH DAILY   gabapentin 300 MG capsule Commonly known as:  NEURONTIN Take 300 mg by mouth at bedtime.   glucose blood test strip Commonly known as:  ACCU-CHEK AVIVA Use to check blood sugar once daily   levothyroxine 75 MCG tablet Commonly known as:  SYNTHROID, LEVOTHROID Take 1 tablet (75 mcg total) by mouth daily before breakfast.   magnesium gluconate 500 MG tablet Commonly known as:  MAGONATE Take 500 mg by mouth daily.   nitroGLYCERIN 0.4 MG SL tablet Commonly known as:  NITROSTAT 1 tablet under tongue at onset of chest pain; you may repeat every 5 minutes for up to 3 doses.   pantoprazole 40 MG tablet Commonly known as:  PROTONIX Take 1 tablet (40 mg total) by mouth daily.   pravastatin 40 MG tablet Commonly known as:  PRAVACHOL TAKE 1 TABLET (40 MG TOTAL) BY MOUTH AT BEDTIME.   sertraline 50 MG tablet Commonly known as:  ZOLOFT TAKE ONE TABLET BY MOUTH DAILY          Total Time in preparing paper work, data evaluation and todays exam - 35 minutes  Dustin Flock M.D on 06/23/2016 at 5:03 [facility]  [place]  [phone]

## 2016-06-23 NOTE — H&P (Signed)
[facility] [place] NAME: Brandi Mahoney    MR#:  [id number]  DATE OF BIRTH:  [birth year]  DATE OF ADMISSION:  06/22/2016  PRIMARY CARE PHYSICIAN: Owens Loffler, MD   REQUESTING/REFERRING PHYSICIAN:   CHIEF COMPLAINT:   Chief Complaint  Patient presents with  . Chest Pain    HISTORY OF PRESENT ILLNESS: Brandi Mahoney  is a 61 y.o. 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 Patient initially presented to emergency room 2 days ago for similar complaints. Troponins were checked and she was sent home. He shouldn't has this chest pain on exertion and it said sharp shooting pain in radiates to the left arm and left shoulder as well as to the neck. No complaints of any shortness of breath, orthopnea and proximal nocturnal dyspnea. No complaints of any headache, dizziness and blurry vision. EKG normal sinus rhythm with no ST segment changes. First set of troponin was negative. Patient was worked up with CT angiogram of chest which showed no pulmonary embolism or aortic dissection. Hospitalist service was consulted for further care of the patient.  PAST MEDICAL HISTORY:   Past Medical History:  Diagnosis Date  . Asthma   . Depression   . Hemorrhoids   . History of pneumonia 1969  . HLD (hyperlipidemia)   . Hypothyroidism   . IBS (irritable bowel syndrome)   . Migraine headache   . Myocarditis ([place]) 01/21/2010   hospitalized- [facility]  . NSTEMI (non-ST elevated myocardial infarction), 01/2010 [facility] 09/19/2013   Secondary to viral myocarditis   . Prediabetes     PAST SURGICAL HISTORY: Past Surgical History:  Procedure Laterality Date  . APPENDECTOMY  1974  . BREAST BIOPSY Right 1988   neg  . CARDIAC CATHETERIZATION  8/11   no significant -[facility]- Dr Rockey Situ,  . CHOLECYSTECTOMY  2002  . COLONOSCOPY   2013  . NEPHROLITHOTOMY Right 08/20/2014   Procedure: RIGHT PERCUTANEOUS NEPHROLITHOTOMY ;  Surgeon: Malka So, MD;  Location: [facility];  Service: Urology;  Laterality: Right;  . TOTAL ABDOMINAL HYSTERECTOMY W/ BILATERAL SALPINGOOPHORECTOMY  2000   no ovaries, uterus, or cervix.    SOCIAL HISTORY:  Social History  Substance Use Topics  . Smoking status: Former Smoker    Packs/day: 0.35    Years: 35.00    Types: Cigarettes    Quit date: 09/25/2008  . Smokeless tobacco: Never Used     Comment: quit in 2007-2008, social smoker  . Alcohol use Yes     Comment: rarely    FAMILY HISTORY:  Family History  Problem Relation Age of Onset  . Coronary artery disease Mother   . Diabetes Mother   . ALS Mother   . Hyperlipidemia Father   . Breast cancer Paternal Grandmother   . Lung cancer Paternal Grandfather     CAD  . Other Daughter     gluten  . Down syndrome Son   . Colon cancer Maternal Grandfather   . Cancer Sister 52    breast  . Breast cancer Sister 42    DRUG ALLERGIES:  Allergies  Allergen Reactions  . Pseudoeph-Doxylamine-Dm-Apap Anaphylaxis    Allergy to nyquil  . Gluten Other (See Comments)    Gi upset  . Penicillins Itching and Rash    REVIEW OF SYSTEMS:   CONSTITUTIONAL: No fever, fatigue or weakness.  EYES: No blurred or  double vision.  EARS, NOSE, AND THROAT: No tinnitus or ear pain.  RESPIRATORY: No cough, shortness of breath, wheezing or hemoptysis.  CARDIOVASCULAR: Has chest pain, no orthopnea, edema.  GASTROINTESTINAL: No nausea, vomiting, diarrhea or abdominal pain.  GENITOURINARY: No dysuria, hematuria.  ENDOCRINE: No polyuria, nocturia,  HEMATOLOGY: No anemia, easy bruising or bleeding SKIN: No rash or lesion. MUSCULOSKELETAL: No joint pain or arthritis.   NEUROLOGIC: No tingling, numbness, weakness.  PSYCHIATRY: No anxiety or depression.   MEDICATIONS AT HOME:  Prior to Admission medications   Medication Sig Start Date End Date Taking?  Authorizing Provider  aspirin EC 81 MG tablet Take 81 mg by mouth daily.   Yes Historical Provider, MD  fenofibrate (TRICOR) 145 MG tablet TAKE ONE TABLET BY MOUTH DAILY 03/30/16  Yes Amy E Bedsole, MD  gabapentin (NEURONTIN) 300 MG capsule Take 300 mg by mouth at bedtime.    Yes Historical Provider, MD  glucose blood (ACCU-CHEK AVIVA) test strip Use to check blood sugar once daily 03/02/16  Yes Owens Loffler, MD  levothyroxine (SYNTHROID, LEVOTHROID) 75 MCG tablet Take 1 tablet (75 mcg total) by mouth daily before breakfast. 03/02/16  Yes Owens Loffler, MD  magnesium gluconate (MAGONATE) 500 MG tablet Take 500 mg by mouth daily.    Yes Historical Provider, MD  Multiple Vitamins-Minerals (CENTRUM SILVER) tablet Take 1 tablet by mouth daily.   Yes Historical Provider, MD  nitroGLYCERIN (NITROSTAT) 0.4 MG SL tablet 1 tablet under tongue at onset of chest pain; you may repeat every 5 minutes for up to 3 doses. 03/02/16  Yes Spencer Copland, MD  pravastatin (PRAVACHOL) 40 MG tablet TAKE 1 TABLET (40 MG TOTAL) BY MOUTH AT BEDTIME. 05/24/16  Yes Spencer Copland, MD  sertraline (ZOLOFT) 50 MG tablet TAKE ONE TABLET BY MOUTH DAILY 04/30/16  Yes Spencer Copland, MD      PHYSICAL EXAMINATION:   VITAL SIGNS: Blood pressure 127/72, pulse 73, temperature 98 F (36.7 C), temperature source Oral, resp. rate 15, height 5\' 4"  (1.626 m), weight 79.4 kg (175 lb), SpO2 94 %.  GENERAL:  61 y.o.-year-old well built female patient lying in the bed with no acute distress.  EYES: Pupils equal, round, reactive to light and accommodation. No scleral icterus. Extraocular muscles intact.  HEENT: Head atraumatic, normocephalic. Oropharynx and nasopharynx clear.  NECK:  Supple, no jugular venous distention. No thyroid enlargement, no tenderness.  LUNGS: Normal breath sounds bilaterally, no wheezing, rales,rhonchi or crepitation. No use of accessory muscles of respiration.  CARDIOVASCULAR: S1, S2 normal. No murmurs, rubs,  or gallops.  ABDOMEN: Soft, nontender, nondistended. Bowel sounds present. No organomegaly or mass.  EXTREMITIES: No pedal edema, cyanosis, or clubbing.  NEUROLOGIC: Cranial nerves II through XII are intact. Muscle strength 5/5 in all extremities. Sensation intact. Gait not checked.  PSYCHIATRIC: The patient is alert and oriented x 3.  SKIN: No obvious rash, lesion, or ulcer.   LABORATORY PANEL:   CBC  Recent Labs Lab 06/20/16 1754 06/22/16 1656  WBC 9.3 9.0  HGB 12.5 12.8  HCT 38.0 38.3  PLT 277 279  MCV 81.3 81.7  MCH 26.7 27.3  MCHC 32.9 33.4  RDW 13.7 13.8   ------------------------------------------------------------------------------------------------------------------  Chemistries   Recent Labs Lab 06/20/16 1754 06/22/16 1656  NA 139 138  K 4.0 3.9  CL 106 105  CO2 28 27  GLUCOSE 115* 107*  BUN 15 16  CREATININE 0.71 0.59  CALCIUM 9.4 9.4   ------------------------------------------------------------------------------------------------------------------ estimated creatinine clearance is 76.3 mL/min (by  C-G formula based on SCr of 0.59 mg/dL). ------------------------------------------------------------------------------------------------------------------ No results for input(s): TSH, T4TOTAL, T3FREE, THYROIDAB in the last 72 hours.  Invalid input(s): FREET3   Coagulation profile No results for input(s): INR, PROTIME in the last 168 hours. ------------------------------------------------------------------------------------------------------------------- No results for input(s): DDIMER in the last 72 hours. -------------------------------------------------------------------------------------------------------------------  Cardiac Enzymes  Recent Labs Lab 06/20/16 1958 06/22/16 1656 06/22/16 2245  TROPONINI <0.03 <0.03 <0.03   ------------------------------------------------------------------------------------------------------------------ Invalid  input(s): POCBNP  ---------------------------------------------------------------------------------------------------------------  Urinalysis    Component Value Date/Time   COLORURINE YELLOW 08/16/2014 1514   APPEARANCEUR CLOUDY (A) 08/16/2014 1514   APPEARANCEUR Clear 11/27/2011 1710   LABSPEC 1.017 08/16/2014 1514   LABSPEC 1.016 11/27/2011 1710   PHURINE 5.0 08/16/2014 1514   GLUCOSEU NEGATIVE 08/16/2014 1514   GLUCOSEU Negative 11/27/2011 1710   HGBUR LARGE (A) 08/16/2014 1514   BILIRUBINUR NEGATIVE 08/16/2014 1514   BILIRUBINUR Negative 11/27/2011 1710   KETONESUR NEGATIVE 08/16/2014 1514   PROTEINUR NEGATIVE 08/16/2014 1514   UROBILINOGEN 0.2 08/16/2014 1514   NITRITE NEGATIVE 08/16/2014 1514   LEUKOCYTESUR NEGATIVE 08/16/2014 1514   LEUKOCYTESUR 1+ 11/27/2011 1710     RADIOLOGY: Dg Chest 2 View  Result Date: 06/22/2016 CLINICAL DATA:  Chest pain.  Shortness of breath. EXAM: CHEST  2 VIEW COMPARISON:  June 20, 2016 FINDINGS: Minimal atelectasis in the left base. The heart, hila, mediastinum, lungs, and pleura are normal. IMPRESSION: No active cardiopulmonary disease. Electronically Signed   By: Dorise Bullion III M.D   On: 06/22/2016 17:26   Ct Angio Chest Pe W And/or Wo Contrast  Result Date: 06/22/2016 CLINICAL DATA:  Left shoulder and fell pain radiating the arm. EXAM: CT ANGIOGRAPHY CHEST WITH CONTRAST TECHNIQUE: Multidetector CT imaging of the chest was performed using the standard protocol during bolus administration of intravenous contrast. Multiplanar CT image reconstructions and MIPs were obtained to evaluate the vascular anatomy. CONTRAST:  75 cc of Isovue 370 IV COMPARISON:  CT abdomen and pelvis from 08/23/2014, CXR 06/22/2016 FINDINGS: Cardiovascular: The study is of quality for the evaluation of pulmonary embolism. There are no filling defects in the central, lobar, segmental or subsegmental pulmonary artery branches to suggest acute pulmonary embolism. Great  vessels are normal in course and caliber. Normal heart size. No significant pericardial fluid/thickening. No aortic aneurysm or dissection. Aortic atherosclerosis and coronary arteriosclerosis. Mediastinum/Nodes: No discrete thyroid nodules. Unremarkable esophagus. No pathologically enlarged axillary, mediastinal or hilar lymph nodes. Patent trachea and mainstem bronchi. Lungs/Pleura: No pneumothorax. No pleural effusion. Bibasilar dependent atelectasis. Upper abdomen: Partially calcified splenic cyst measuring 4.8 cm in diameter is stable since 2016 comparison. Hepatic steatosis is noted without space-occupying mass. No acute upper abdominal abnormality. Musculoskeletal:  No aggressive appearing focal osseous lesions. Review of the MIP images confirms the above findings. IMPRESSION: No acute pulmonary embolus, aortic aneurysm or dissection. Aortic atherosclerosis and coronary arteriosclerosis. Hepatic steatosis with stable partially calcified splenic cyst. Electronically Signed   By: Ashley Royalty M.D.   On: 06/22/2016 23:18    EKG: Orders placed or performed during the hospital encounter of 06/22/16  . ED EKG within 10 minutes  . 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 Admitting diagnosis 1. Unstable angina 2. Hypothyroidism 3. Hyperlipidemia 4. History of myocarditis 5. Bronchial asthma which is stable Treatment plan Admit patient to telemetry observation bed Aspirin 81  mg orally daily Resume statin medication DVT prophylaxis subcutaneous Lovenox 40 MG daily Cycle troponin Check echocardiogram and cardiology consultation Cardiac stress test as per cardiology recommendation for further assessment of ischemia Supportive care.  All the records are reviewed and case  discussed with ED provider. Management plans discussed with the patient, family and they are in agreement.  CODE STATUS:FULL CODE Code Status History    Date Active Date Inactive Code Status Order ID Comments User Context   08/24/2014 11:48 AM 08/25/2014  2:40 PM Full Code [id number]  Sandi Mariscal, MD Inpatient   08/20/2014 11:04 AM 08/24/2014 11:48 AM Full Code [id number]  Malka So, MD Inpatient   08/19/2014  3:19 PM 08/20/2014  3:44 AM Full Code [id number]  Jacqulynn Cadet, MD HOV       TOTAL TIME TAKING CARE OF THIS PATIENT: 51 minutes.    Saundra Shelling M.D on 06/23/2016 at 2:03 AM  Between 7am to 6pm - Pager - [phone]  After 6pm go to [email or web address] - password [email or web address] [facility]  Office  [phone]  CC: Primary care physician; Owens Loffler, MD

## 2016-06-23 NOTE — Care Management (Signed)
Brandi Mahoney was not in the room when I went by to check on her, I will follow up later on today and share the information with the rest of [facility] Team in case I am not here.

## 2016-06-23 NOTE — Progress Notes (Signed)
Patient discharged via wheelchair and private vehicle. IV removed and catheter intact. All discharge instructions given and patient verbalizes understanding. Tele removed and returned. No prescriptions given to patient No distress noted.   

## 2016-06-28 ENCOUNTER — Encounter: Payer: Self-pay | Admitting: Cardiovascular Disease

## 2016-06-28 ENCOUNTER — Ambulatory Visit (INDEPENDENT_AMBULATORY_CARE_PROVIDER_SITE_OTHER): Payer: 59 | Admitting: Cardiovascular Disease

## 2016-06-28 VITALS — BP 122/78 | HR 75 | Ht 64.0 in | Wt 174.0 lb

## 2016-06-28 DIAGNOSIS — F419 Anxiety disorder, unspecified: Secondary | ICD-10-CM | POA: Diagnosis not present

## 2016-06-28 DIAGNOSIS — G4733 Obstructive sleep apnea (adult) (pediatric): Secondary | ICD-10-CM | POA: Diagnosis not present

## 2016-06-28 DIAGNOSIS — M25512 Pain in left shoulder: Secondary | ICD-10-CM

## 2016-06-28 DIAGNOSIS — M79602 Pain in left arm: Secondary | ICD-10-CM

## 2016-06-28 DIAGNOSIS — E782 Mixed hyperlipidemia: Secondary | ICD-10-CM | POA: Diagnosis not present

## 2016-06-28 DIAGNOSIS — R0789 Other chest pain: Secondary | ICD-10-CM | POA: Diagnosis not present

## 2016-06-28 DIAGNOSIS — R0602 Shortness of breath: Secondary | ICD-10-CM

## 2016-06-28 MED ORDER — NITROGLYCERIN 0.4 MG SL SUBL: SUBLINGUAL_TABLET | SUBLINGUAL | 3 refills | Status: DC

## 2016-06-28 NOTE — Patient Instructions (Signed)

## 2016-06-28 NOTE — Progress Notes (Signed)
Cardiology Office Note  Date:  06/28/2016   ID:  Brandi Mahoney, DOB 05-28-1956, MRN [id number]  PCP:  Owens Loffler, MD   Chief Complaint  Patient presents with  . other    F/u ED no complaints of new symptoms. Ekg request only if necessary. Meds reviewed verbally with pt.    HPI:  Brandi Mahoney is a very pleasant 61 year old woman with hx of troponin elevation to 17, with cardiac catheterization August 2011 showing nonobstructive left circumflex disease, presentation felt to be secondary to a viral myocarditis with low normal systolic function by cath, repeat admission to [facility] for chest discomfort October 2011, felt to be noncardiac with echocardiogram showing normal systolic function who presents for routine followup after recent hospitalization last week for chest pain. Admitted to hospital 06/23/2016  presenting with stuttering chest pain. Patient was in the emergency room earlier that week with similar symptoms, sent home with negative cardiac enzymes    she reports developing chest pain with exertion, sometimes at rest Left side chest sometimes up to her left arm, left jaw Continues to have stress at home, takes care of son who has Down's syndrome Also reports having some shortness of breath on exertion, developing into chest tightness  Hospital records reviewed with her She had CT scan chest in the emergency room showing no PE No significant coronary calcification Images reviewed by myself Stress test as an inpatient showing no ischemia  Lab work showing negative cardiac enzymes, potassium 3.5, normal CBC, total cholesterol 119, LDL 39  In follow-up in the office today, she continues to have some mild SOB on exertion and Left arm pain No edema, no ABD fullness Trouble sleeping  Diabetes with hemoglobin with C6.5  Other past medical history reviewed On a prior office visit in 2015, she reported having chest pain in the center of her chest radiating to  her left arm starting 2 days ago. General malaise felt queasy, lightheaded, had chest pain and left arm pain.   shortness of breath with exertion. She is worried about progression of her CAD. She went to the emergency room on the way home from work. Cardiac enzymes were negative x2 EKG essentially normal.   Given her chest pain a stress test was performed February 2015.  showed no ischemia. She had good exercise tolerance though on a treadmill did develop left chest and arm pain. She achieved target heart rate. EKG was essentially normal with no significant changes concerning for ischemia. No perfusion abnormality.   Cardiac catheterization January 20 2010: mild atherosclerosis in the mid left circumflex after the takeoff of the OM1, mild diffuse LAD disease repeat echocardiogram on March 19, 2010 showing ejection fraction [id number], diastolic dysfunction, mild LVH   PMH:   has a past medical history of Asthma; CAD (coronary artery disease); Depression; Hemorrhoids; History of pneumonia (1969); HLD (hyperlipidemia); Hypothyroidism; IBS (irritable bowel syndrome); Migraine headache; Myocarditis ([place]); and Prediabetes.  PSH:    Past Surgical History:  Procedure Laterality Date  . APPENDECTOMY  1974  . BREAST BIOPSY Right 1988   neg  . CARDIAC CATHETERIZATION  8/11   no significant -[facility]- Dr Rockey Situ,  . CHOLECYSTECTOMY  2002  . COLONOSCOPY  2013  . NEPHROLITHOTOMY Right 08/20/2014   Procedure: RIGHT PERCUTANEOUS NEPHROLITHOTOMY ;  Surgeon: Malka So, MD;  Location: [facility];  Service: Urology;  Laterality: Right;  . TOTAL ABDOMINAL HYSTERECTOMY W/ BILATERAL SALPINGOOPHORECTOMY  2000   no ovaries, uterus, or cervix.  Current Outpatient Prescriptions  Medication Sig Dispense Refill  . aspirin EC 81 MG tablet Take 81 mg by mouth daily.    . fenofibrate (TRICOR) 145 MG tablet TAKE ONE TABLET BY MOUTH DAILY 30 tablet 11  . gabapentin (NEURONTIN) 300 MG capsule Take 300 mg by mouth at  bedtime.     Marland Kitchen glucose blood (ACCU-CHEK AVIVA) test strip Use to check blood sugar once daily 100 each 3  . levothyroxine (SYNTHROID, LEVOTHROID) 75 MCG tablet Take 1 tablet (75 mcg total) by mouth daily before breakfast. 90 tablet 1  . magnesium gluconate (MAGONATE) 500 MG tablet Take 500 mg by mouth daily.     . Multiple Vitamins-Minerals (CENTRUM SILVER) tablet Take 1 tablet by mouth daily.    . nitroGLYCERIN (NITROSTAT) 0.4 MG SL tablet 1 tablet under tongue at onset of chest pain; you may repeat every 5 minutes for up to 3 doses. 25 tablet 3  . pantoprazole (PROTONIX) 40 MG tablet Take 1 tablet (40 mg total) by mouth daily. 30 tablet 0  . pravastatin (PRAVACHOL) 40 MG tablet TAKE 1 TABLET (40 MG TOTAL) BY MOUTH AT BEDTIME. 90 tablet 2  . sertraline (ZOLOFT) 50 MG tablet TAKE ONE TABLET BY MOUTH DAILY 30 tablet 5   No current facility-administered medications for this visit.      Allergies:   Pseudoeph-doxylamine-dm-apap; Gluten; and Penicillins   Social History:  The patient  reports that she quit smoking about 7 years ago. Her smoking use included Cigarettes. She has a 12.25 pack-year smoking history. She has never used smokeless tobacco. She reports that she drinks alcohol. She reports that she does not use drugs.   Family History:   family history includes ALS in her mother; Breast cancer in her paternal grandmother; Breast cancer (age of onset: 36) in her sister; Cancer (age of onset: 45) in her sister; Colon cancer in her maternal grandfather; Coronary artery disease in her mother; Diabetes in her mother; Down syndrome in her son; Hyperlipidemia in her father; Lung cancer in her paternal grandfather; Other in her daughter.    Review of Systems: Review of Systems  Constitutional: Negative.   Respiratory: Positive for shortness of breath.   Cardiovascular: Negative.   Gastrointestinal: Negative.   Musculoskeletal: Negative.        Left arm pain  Neurological: Negative.    Psychiatric/Behavioral: Negative.   All other systems reviewed and are negative.    PHYSICAL EXAM: VS:  BP 122/78 (BP Location: Left Arm, Patient Position: Sitting, Cuff Size: Normal)   Pulse 75   Ht 5\' 4"  (1.626 m)   Wt 174 lb (78.9 kg)   BMI 29.87 kg/m  , BMI Body mass index is 29.87 kg/m. GEN: Well nourished, well developed, in no acute distress, obese  HEENT: normal  Neck: no JVD, carotid bruits, or masses Cardiac: RRR; no murmurs, rubs, or gallops,no edema  Respiratory:  clear to auscultation bilaterally, normal work of breathing GI: soft, nontender, nondistended, + BS MS: no deformity or atrophy  Skin: warm and dry, no rash Neuro:  Strength and sensation are intact Psych: euthymic mood, full affect    Recent Labs: 03/01/2016: ALT 50 05/02/2016: TSH 2.00 06/23/2016: BUN 16; Creatinine, Ser 0.68; Hemoglobin 11.9; Platelets 251; Potassium 3.5; Sodium 139    Lipid Panel Lab Results  Component Value Date   CHOL 119 06/23/2016   HDL 37 (L) 06/23/2016   LDLCALC 39 06/23/2016   TRIG 213 (H) 06/23/2016      Wt  Readings from Last 3 Encounters:  06/28/16 174 lb (78.9 kg)  06/23/16 175 lb 1.6 oz (79.4 kg)  06/20/16 175 lb (79.4 kg)       ASSESSMENT AND PLAN:  Mixed hyperlipidemia Cholesterol is at goal on the current lipid regimen. No changes to the medications were made.  Coronary artery disease involving native coronary artery of native heart with angina pectoris (HCC) Recent stress test showing no ischemia, CT scan reviewed with her showing no significant coronary disease. No further testing needed Similar symptoms in the past 2011, 2015  Other chest pain Atypical chest pain as above  Anxiety Long history of stress at home Son with Down syndrome. Possible caretaker stressors  Left arm pain Atypical in nature, unable to exclude paravertebral muscle spasms, cervical disc disease  Shortness of breath Recommended regular exercise program for weight loss,  conditioning Recent CT scan with no abnormality, stress test with no skin anemia Appears euvolemic   Total encounter time more than 45 minutes  Greater than 50% was spent in counseling and coordination of care with the patient  Disposition:   F/U  12 months    Signed, Esmond Plants, M.D., Ph.D. 06/28/2016  [place], [place]

## 2016-07-04 ENCOUNTER — Ambulatory Visit (INDEPENDENT_AMBULATORY_CARE_PROVIDER_SITE_OTHER): Payer: 59 | Admitting: Internal Medicine

## 2016-07-04 ENCOUNTER — Encounter: Payer: Self-pay | Admitting: Internal Medicine

## 2016-07-04 VITALS — BP 140/84 | HR 118 | Temp 99.7°F | Wt 171.8 lb

## 2016-07-04 DIAGNOSIS — R52 Pain, unspecified: Secondary | ICD-10-CM | POA: Diagnosis not present

## 2016-07-04 DIAGNOSIS — J111 Influenza due to unidentified influenza virus with other respiratory manifestations: Secondary | ICD-10-CM

## 2016-07-04 LAB — POCT INFLUENZA A/B
INFLUENZA A, POC: POSITIVE — AB
Influenza B, POC: NEGATIVE

## 2016-07-04 MED ORDER — OSELTAMIVIR PHOSPHATE 75 MG PO CAPS: 75.0000 mg | ORAL_CAPSULE | Freq: Two times a day (BID) | ORAL | 0 refills | Status: DC

## 2016-07-04 MED ORDER — HYDROCODONE-HOMATROPINE 5-1.5 MG/5ML PO SYRP: 5.0000 mL | ORAL_SOLUTION | Freq: Three times a day (TID) | ORAL | 0 refills | Status: DC | PRN

## 2016-07-04 NOTE — Progress Notes (Signed)
Pre visit review using our clinic review tool, if applicable. No additional management support is needed unless otherwise documented below in the visit note. 

## 2016-07-04 NOTE — Patient Instructions (Signed)

## 2016-07-04 NOTE — Progress Notes (Signed)
HPI  Pt presents to the clinic today with c/o nasal congestion, sore throat and cough. This started 4-5 days ago but got significantly worse last night. She is blowing clear mucous out of her nose. She denies difficulty swallowing. She denies ear pain or chest congestion. She has run fevers up to 103.0, had chills, body aches and nausea, but denies vomiting or diarrhea. She has tried Tylenol with minimal relief. She has a history of asthma. She has had her flu shot. She reports her son was just diagnosed with the flu.  Review of Systems        Past Medical History:  Diagnosis Date  . Asthma   . CAD (coronary artery disease)    a. 01/2010 Cath: mild atherosclerosis in the mid LCX after takeoff of OM1, mild diff LAD dzs;  b. 07/2013 MV: EF 67%, no ischemia.  . Depression   . Hemorrhoids   . History of pneumonia 1969  . HLD (hyperlipidemia)   . Hypothyroidism   . IBS (irritable bowel syndrome)   . Migraine headache   . Myocarditis ([place])    a. 01/2010 presented w/ c/p and elev trop-->Cath: mild atherosclerosis in the mid LCX after takeoff of OM1, mild diff LAD dzs;  b. 03/2010 Echo: EF 60-65%, DD, mild LVH;  c. 07/2011 Echo: EF 55-60%, mild LVH, no rwma.  . Prediabetes     Family History  Problem Relation Age of Onset  . Coronary artery disease Mother   . Diabetes Mother   . ALS Mother   . Hyperlipidemia Father   . Breast cancer Paternal Grandmother   . Lung cancer Paternal Grandfather     CAD  . Other Daughter     gluten  . Down syndrome Son   . Colon cancer Maternal Grandfather   . Cancer Sister 75    breast  . Breast cancer Sister 48    Social History   Social History  . Marital status: Married    Spouse name: Nicole Kindred  . Number of children: 3  . Years of education: N/A   Occupational History  . 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  Verdie Mosher.    Social History Main Topics  . Smoking status: Former Smoker    Packs/day: 0.35    Years: 35.00     Types: Cigarettes    Quit date: 09/25/2008  . Smokeless tobacco: Never Used     Comment: quit in 2007-2008, social smoker  . Alcohol use Yes     Comment: rarely  . Drug use: No  . Sexual activity: Not on file   Other Topics Concern  . Not on file   Social History Narrative   Lives in [place] w/ husband.  No reg exercise.    Allergies  Allergen Reactions  . Pseudoeph-Doxylamine-Dm-Apap Anaphylaxis    Allergy to nyquil  . Gluten Other (See Comments)    Gi upset  . Penicillins Itching and Rash     Constitutional: Positive fatigue and fever. Denies abrupt weight changes.  HEENT:  Positive nasal congestion, sore throat. Denies eye redness, eye pain, pressure behind the eyes, facial pain, ear pain, ringing in the ears, wax buildup, runny nose or bloody nose. Respiratory:  Positive cough, Denies difficulty breathing or shortness of breath.  Cardiovascular: Denies chest pain, chest tightness, palpitations or swelling in the hands or feet.   No other specific complaints in a complete review of systems (except as listed in  HPI above).  Objective:   BP 140/84   Pulse (!) 118   Temp 99.7 F (37.6 C) (Oral)   Wt 171 lb 12 oz (77.9 kg)   SpO2 98%   BMI 29.48 kg/m   Wt Readings from Last 3 Encounters:  07/04/16 171 lb 12 oz (77.9 kg)  06/28/16 174 lb (78.9 kg)  06/23/16 175 lb 1.6 oz (79.4 kg)     General: Appears her stated age, ill appearing in NAD. HEENT: Head: normal shape and size, no sinus tenderness noted; Eyes: sclera white, no icterus, conjunctiva pink; Ears: Tm's gray and intact, normal light reflex; Nose: mucosa pink and moist, septum midline; Throat/Mouth: Teeth present, mucosa erythematous and moist, no exudate noted, no lesions or ulcerations noted.  Neck: No cervical lymphadenopathy.  Cardiovascular: Tachycardic with normal rhythm. S1,S2 noted.  Murmur noted.  Pulmonary/Chest: Normal effort and positive vesicular breath sounds. No respiratory distress. No  wheezes, rales or ronchi noted.       Assessment & Plan:   Influenza:  Get some rest and drink plenty of water Do salt water gargles for the sore throat eRx for Tamiflu x 5 days eRx for Tamiflu also provided for prophylaxis for husband (Dr. Lillie Fragmin pt) Rx for Hycodan cough syrup Work note provided to return Monday  RTC as needed or if symptoms persist.   Webb Silversmith, NP

## 2016-07-04 NOTE — Addendum Note (Signed)
Addended by: Lurlean Nanny on: 07/04/2016 11:48 AM   Modules accepted: Orders

## 2016-07-12 ENCOUNTER — Telehealth: Payer: Self-pay

## 2016-07-12 MED ORDER — MAGIC MOUTHWASH W/LIDOCAINE: 5.0000 mL | Freq: Four times a day (QID) | ORAL | 0 refills | Status: DC | PRN

## 2016-07-12 NOTE — Addendum Note (Signed)
Addended by: Jearld Fenton on: 07/12/2016 07:09 PM   Modules accepted: Orders

## 2016-07-12 NOTE — Telephone Encounter (Signed)
RX for magic mouthwash printed will fax. Blisters likely viral. Should resolve within 7-10 days

## 2016-07-12 NOTE — Telephone Encounter (Signed)
Pt left v/m; pt was seen 07/04/16 and dx with the flu; the last few days pt has noticed small blisters on tongue. Pt not sure if this feeling of blisters on tongue could be related to the flu or not; pt request med for blisters on tongue. [employer].

## 2016-07-13 NOTE — Telephone Encounter (Signed)
Rx faxed earlier and pt is aware and expressed understanding

## 2016-07-28 ENCOUNTER — Other Ambulatory Visit: Payer: Self-pay | Admitting: *Deleted

## 2016-07-28 ENCOUNTER — Emergency Department
Admission: EM | Admit: 2016-07-28 | Discharge: 2016-07-28 | Disposition: A | Discharge status: Home / Self Care | Payer: 59 | Attending: Emergency Medicine | Admitting: Emergency Medicine

## 2016-07-28 ENCOUNTER — Encounter: Payer: Self-pay | Admitting: Emergency Medicine

## 2016-07-28 DIAGNOSIS — Z87891 Personal history of nicotine dependence: Secondary | ICD-10-CM | POA: Diagnosis not present

## 2016-07-28 DIAGNOSIS — I2510 Atherosclerotic heart disease of native coronary artery without angina pectoris: Secondary | ICD-10-CM | POA: Insufficient documentation

## 2016-07-28 DIAGNOSIS — Z7982 Long term (current) use of aspirin: Secondary | ICD-10-CM | POA: Insufficient documentation

## 2016-07-28 DIAGNOSIS — J45909 Unspecified asthma, uncomplicated: Secondary | ICD-10-CM | POA: Insufficient documentation

## 2016-07-28 DIAGNOSIS — Z79899 Other long term (current) drug therapy: Secondary | ICD-10-CM | POA: Diagnosis not present

## 2016-07-28 DIAGNOSIS — N764 Abscess of vulva: Secondary | ICD-10-CM | POA: Diagnosis present

## 2016-07-28 DIAGNOSIS — E039 Hypothyroidism, unspecified: Secondary | ICD-10-CM | POA: Diagnosis not present

## 2016-07-28 MED ORDER — LIDOCAINE HCL 1 % IJ SOLN
5.0000 mL | Freq: Once | INTRAMUSCULAR | Status: DC
  Filled 2016-07-28: qty 5

## 2016-07-28 MED ORDER — SULFAMETHOXAZOLE-TRIMETHOPRIM 800-160 MG PO TABS: 1.0000 | ORAL_TABLET | Freq: Two times a day (BID) | ORAL | 0 refills | Status: AC

## 2016-07-28 MED ORDER — LIDOCAINE HCL (PF) 1 % IJ SOLN
INTRAMUSCULAR | Status: AC
  Filled 2016-07-28: qty 5

## 2016-07-28 MED ORDER — PANTOPRAZOLE SODIUM 40 MG PO TBEC: 40.0000 mg | DELAYED_RELEASE_TABLET | Freq: Every day | ORAL | 1 refills | Status: DC

## 2016-07-28 NOTE — ED Notes (Signed)
AAOx3.  Skin warm and dry.  NAD 

## 2016-07-28 NOTE — ED Provider Notes (Signed)
[facility] Emergency Department Provider Note  ____________________________________________  Time seen: Approximately 5:09 PM  I have reviewed the triage vital signs and the nursing notes.   HISTORY  Chief Complaint Cyst   HPI Brandi Mahoney is a 61 y.o. female presenting to the emergency department with 8/10 right labial abscess pain for one week. Patient states that abscess has started to drain. Patient has tried warm compresses. Patient denies having a history of abscesses or diabetes. Patient has been afebrile.    Past Medical History:  Diagnosis Date  . Asthma   . CAD (coronary artery disease)    a. 01/2010 Cath: mild atherosclerosis in the mid LCX after takeoff of OM1, mild diff LAD dzs;  b. 07/2013 MV: EF 67%, no ischemia.  . Depression   . Hemorrhoids   . History of pneumonia 1969  . HLD (hyperlipidemia)   . Hypothyroidism   . IBS (irritable bowel syndrome)   . Migraine headache   . Myocarditis ([place])    a. 01/2010 presented w/ c/p and elev trop-->Cath: mild atherosclerosis in the mid LCX after takeoff of OM1, mild diff LAD dzs;  b. 03/2010 Echo: EF 60-65%, DD, mild LVH;  c. 07/2011 Echo: EF 55-60%, mild LVH, no rwma.  . Prediabetes     Patient Active Problem List   Diagnosis Date Noted  . Chest pain 06/23/2016  . Acute pain of left shoulder   . Coronary artery disease involving native coronary artery of native heart with angina pectoris ([place])   . Borderline diabetes   . Anxiety   . Renal stone   . Obstructive sleep apnea 09/25/2013  . 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 IBS (irritable bowel syndrome) 09/26/2012  . PARESTHESIA, HANDS 06/01/2010  . Mixed hyperlipidemia 04/01/2010  . 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 HYPERTRIGLYCERIDEMIA 07/21/2009  . Hypothyroidism 09/08/2008  . Major depressive disorder, recurrent episode, in full remission ([place]) 09/08/2008  . ASTHMA 09/08/2008    Past Surgical History:  Procedure  Laterality Date  . APPENDECTOMY  1974  . BREAST BIOPSY Right 1988   neg  . CARDIAC CATHETERIZATION  8/11   no significant -[facility] Dr Rockey Situ,  . CHOLECYSTECTOMY  2002  . COLONOSCOPY  2013  . NEPHROLITHOTOMY Right 08/20/2014   Procedure: RIGHT PERCUTANEOUS NEPHROLITHOTOMY ;  Surgeon: Malka So, MD;  Location: [facility] ORS;  Service: Urology;  Laterality: Right;  . TOTAL ABDOMINAL HYSTERECTOMY W/ BILATERAL SALPINGOOPHORECTOMY  2000   no ovaries, uterus, or cervix.    Prior to Admission medications   Medication Sig Start Date End Date Taking? Authorizing Provider  aspirin EC 81 MG tablet Take 81 mg by mouth daily.    Historical Provider, MD  fenofibrate (TRICOR) 145 MG tablet TAKE ONE TABLET BY MOUTH DAILY 03/30/16   Amy Cletis [place], MD  gabapentin (NEURONTIN) 300 MG capsule Take 300 mg by mouth at bedtime.     Historical Provider, MD  glucose blood (ACCU-CHEK AVIVA) test strip Use to check blood sugar once daily 03/02/16   Owens Loffler, MD  HYDROcodone-homatropine [facility]) 5-1.5 MG/5ML syrup Take 5 mLs by mouth every 8 (eight) hours as needed for cough. 07/04/16   Jearld Fenton, NP  levothyroxine (SYNTHROID, LEVOTHROID) 75 MCG tablet Take 1 tablet (75 mcg total) by mouth daily before breakfast. 03/02/16   Owens Loffler, MD  magic mouthwash w/lidocaine SOLN Take 5 mLs by mouth 4 (four) times daily as needed for mouth pain. 07/12/16   Jearld Fenton, NP  magnesium gluconate (MAGONATE) 500 MG tablet Take 500 mg by mouth daily.     Historical Provider, MD  Multiple Vitamins-Minerals (CENTRUM SILVER) tablet Take 1 tablet by mouth daily.    Historical Provider, MD  nitroGLYCERIN (NITROSTAT) 0.4 MG SL tablet 1 tablet under tongue at onset of chest pain; you may repeat every 5 minutes for up to 3 doses. 06/28/16   Minna Merritts, MD  oseltamivir (TAMIFLU) 75 MG capsule Take 1 capsule (75 mg total) by mouth 2 (two) times daily. 07/04/16   Jearld Fenton, NP  pantoprazole (PROTONIX) 40 MG tablet Take 1  tablet (40 mg total) by mouth daily. 07/28/16   Owens Loffler, MD  pravastatin (PRAVACHOL) 40 MG tablet TAKE 1 TABLET (40 MG TOTAL) BY MOUTH AT BEDTIME. 05/24/16   Owens Loffler, MD  sertraline (ZOLOFT) 50 MG tablet TAKE ONE TABLET BY MOUTH DAILY 04/30/16   Owens Loffler, MD  sulfamethoxazole-trimethoprim (BACTRIM DS) 800-160 MG tablet Take 1 tablet by mouth 2 (two) times daily. 07/28/16 08/04/16  Lannie Fields, PA-C    Allergies Pseudoeph-doxylamine-dm-apap; Gluten; and Penicillins  Family History  Problem Relation Age of Onset  . Coronary artery disease Mother   . Diabetes Mother   . ALS Mother   . Hyperlipidemia Father   . Breast cancer Paternal Grandmother   . Lung cancer Paternal Grandfather     CAD  . Other Daughter     gluten  . Down syndrome Son   . Colon cancer Maternal Grandfather   . Cancer Sister 19    breast  . Breast cancer Sister 36    Social History Social History  Substance Use Topics  . Smoking status: Former Smoker    Packs/day: 0.35    Years: 35.00    Types: Cigarettes    Quit date: 09/25/2008  . Smokeless tobacco: Never Used     Comment: quit in 2007-2008, social smoker  . Alcohol use Yes     Comment: rarely     Review of Systems  Constitutional: No fever/chills Eyes: No visual changes. No discharge ENT: No upper respiratory complaints. Cardiovascular: no chest pain. Respiratory: no cough. No SOB. Gastrointestinal: No abdominal pain.  No nausea, no vomiting.  No diarrhea.  No constipation. Genitourinary: Negative for dysuria. No hematuria Musculoskeletal: Negative for musculoskeletal pain. Skin: Patient has right labial abscess.  __________________________________________   PHYSICAL EXAM:  VITAL SIGNS: ED Triage Vitals  Enc Vitals Group     BP 07/28/16 1452 131/83     Pulse Rate 07/28/16 1452 74     Resp 07/28/16 1452 18     Temp 07/28/16 1452 98.2 F (36.8 C)     Temp Source 07/28/16 1452 Oral     SpO2 07/28/16 1452 97 %      Weight 07/28/16 1451 170 lb (77.1 kg)     Height 07/28/16 1451 5\' 4"  (1.626 m)     Head Circumference --      Peak Flow --      Pain Score 07/28/16 1451 4     Pain Loc --      Pain Edu? --      Excl. in [place]? --      Constitutional: Alert and oriented. Well appearing and in no acute distress. Cardiovascular: Normal rate, regular rhythm. Normal S1 and S2.  Good peripheral circulation. Respiratory: Normal respiratory effort without tachypnea or retractions. Lungs CTAB. Good air entry to the bases with no decreased or absent breath sounds. Gastrointestinal: Bowel sounds  4 quadrants. Soft and nontender to palpation. No guarding or rigidity. No palpable masses. No distention. No CVA tenderness. Musculoskeletal: Full range of motion to all extremities. No gross deformities appreciated. Neurologic:  Normal speech and language. No gross focal neurologic deficits are appreciated.  Skin:  Patient has an uncomplicated right labial abscess. No surrounding cellulitis or streaking. Psychiatric: Mood and affect are normal. Speech and behavior are normal. Patient exhibits appropriate insight and judgement. ___________________________________________   LABS (all labs ordered are listed, but only abnormal results are displayed)  Labs Reviewed - No data to display ____________________________________________  EKG   ____________________________________________  RADIOLOGY   No results found.  ____________________________________________    PROCEDURES  Procedure(s) performed:    Procedures    Medications  lidocaine (XYLOCAINE) 1 % (with pres) injection 5 mL (not administered)  lidocaine (PF) (XYLOCAINE) 1 % injection (not administered)   INCISION AND DRAINAGE Performed by: Lannie Fields Consent: Verbal consent obtained. Risks and benefits: risks, benefits and alternatives were discussed Type: abscess  Body area: Right Labia  Anesthesia: local infiltration  Incision was made  with a scalpel.  Local anesthetic: lidocaine 1% without epinephrine  Anesthetic total: 2 ml  Complexity: complex Blunt dissection to break up loculations  Drainage: purulent  Drainage amount: 1 cc  Packing material: None   Patient tolerance: Patient tolerated the procedure well with no immediate complications.  ____________________________________________   INITIAL IMPRESSION / ASSESSMENT AND PLAN / ED COURSE  Pertinent labs & imaging results that were available during my care of the patient were reviewed by me and considered in my medical decision making (see chart for details).  Review of the  [place]RS was performed in accordance of the [place] prior to dispensing any controlled drugs.    Assessment and Plan: Right Labial Abscess  Patient presents to the emergency department with a right labial abscess. Patient underwent incision and drainage in the emergency department. Patient tolerated the procedure well. She was discharged with Bactrim. All patient questions were answered.  ____________________________________________  FINAL CLINICAL IMPRESSION(S) / ED DIAGNOSES  Final diagnoses:  Labial abscess      NEW MEDICATIONS STARTED DURING THIS VISIT:  New Prescriptions   SULFAMETHOXAZOLE-TRIMETHOPRIM (BACTRIM DS) 800-160 MG TABLET    Take 1 tablet by mouth 2 (two) times daily.        This chart was dictated using voice recognition software/Dragon. Despite best efforts to proofread, errors can occur which can change the meaning. Any change was purely unintentional.    Lannie Fields, PA-C 07/28/16 1739    Delman Kitten, MD 07/28/16 [phone]

## 2016-07-28 NOTE — ED Triage Notes (Signed)
C/o "cyst" to labia. Noticed small bump a few weeks ago and reports it has gotten bigger.  Has been draining like a blood color per pt. Denies fevers.

## 2016-08-15 ENCOUNTER — Ambulatory Visit: Payer: No Typology Code available for payment source | Admitting: Obstetrics & Gynecology

## 2016-08-15 ENCOUNTER — Encounter: Payer: Self-pay | Admitting: Obstetrics & Gynecology

## 2016-08-15 ENCOUNTER — Ambulatory Visit (INDEPENDENT_AMBULATORY_CARE_PROVIDER_SITE_OTHER): Payer: 59 | Admitting: Obstetrics & Gynecology

## 2016-08-15 VITALS — BP 140/80 | HR 76 | Ht 64.0 in | Wt 174.0 lb

## 2016-08-15 DIAGNOSIS — N952 Postmenopausal atrophic vaginitis: Secondary | ICD-10-CM

## 2016-08-15 DIAGNOSIS — R232 Flushing: Secondary | ICD-10-CM | POA: Diagnosis not present

## 2016-08-15 MED ORDER — ESTRADIOL 0.1 MG/GM VA CREA: 1.0000 | TOPICAL_CREAM | VAGINAL | 3 refills | Status: DC

## 2016-08-15 NOTE — Patient Instructions (Signed)
Atrophic Vaginitis Atrophic vaginitis is when the tissues that line the vagina become dry and thin. This is caused by a drop in estrogen. Estrogen helps:  To keep the vagina moist.  To make a clear fluid that helps:  To lubricate the vagina for sex.  To protect the vagina from infection. If the lining of the vagina is dry and thin, it may:  Make sex painful. It may also cause bleeding.  Cause a feeling of:  Burning.  Irritation.  Itchiness.  Make an exam of your vagina painful. It may also cause bleeding.  Make you lose interest in sex.  Cause a burning feeling when you pee.  Make your vaginal fluid (discharge) brown or yellow. For some women, there are no symptoms. This condition is most common in women who do not get their regular menstrual periods anymore (menopause). This often starts when a woman is 45-55 years old. Follow these instructions at home:  Take medicines only as told by your doctor. Do not use any herbal or alternative medicines unless your doctor says it is okay.  Use over-the-counter products for dryness only as told by your doctor. These include:  Creams.  Lubricants.  Moisturizers.  Do not douche.  Do not use products that can make your vagina dry. These include:  Scented feminine sprays.  Scented tampons.  Scented soaps.  If it hurts to have sex, tell your sexual partner. Contact a doctor if:  Your discharge looks different than normal.  Your vagina has an unusual smell.  You have new symptoms.  Your symptoms do not get better with treatment.  Your symptoms get worse. This information is not intended to replace advice given to you by your health care provider. Make sure you discuss any questions you have with your health care provider. Document Released: 11/22/2007 Document Revised: 11/11/2015 Document Reviewed: 05/27/2014 Elsevier Interactive Patient Education  2017 Elsevier Inc.  

## 2016-08-15 NOTE — Progress Notes (Signed)
Chief Complaint  Patient presents with  . Vaginal Pain    pain during intercourse,  . Hot Flashes   HPI: Pt is a 61 yo WF with menopause since TAH and BSO 18 years ago, took HRT for 3 years then stopped, min sx's over the last 15 years.  Now has 2 month h/o worsening hot flashes but also vag dryness and dyspareunia.  Pt had bartholins cyst treated 2 weeks ago in ER.  Pt desires treatment more for dyspareunia than for hot flashes. Pain is diffuse and not localized to Bartholins area, non radiating, no modifiers, no assoc sx's, no other context.  Denies vag bleeding. No urinary complaints other than occas leakage w cough.   PMHx: The following portions of the patient's history were reviewed and updated as appropriate:            She  has a past medical history of Asthma; CAD (coronary artery disease); Depression; Hemorrhoids; History of pneumonia (1969); HLD (hyperlipidemia); Hypothyroidism; IBS (irritable bowel syndrome); Migraine headache; Myocarditis ([place]); and Prediabetes. She  does not have any pertinent problems on file. She  has a past surgical history that includes Appendectomy (1974); Cholecystectomy (2002); Total abdominal hysterectomy w/ bilateral salpingoophorectomy (2000); Cardiac catheterization (8/11); Nephrolithotomy (Right, 08/20/2014); Colonoscopy (2013); Breast biopsy (Right, 1988); and Cyst excision. Her family history includes ALS in her mother; Breast cancer in her paternal grandmother; Breast cancer (age of onset: 38) in her sister; Cancer (age of onset: 70) in her sister; Colon cancer in her maternal grandfather; Coronary artery disease in her mother; Diabetes in her mother; Down syndrome in her son; Hyperlipidemia in her father; Lung cancer in her paternal grandfather; Other in her daughter. She  reports that she quit smoking about 7 years ago. Her smoking use included Cigarettes. She has a 12.25 pack-year smoking history. She has never used smokeless tobacco. She reports that  she drinks alcohol. She reports that she does not use drugs. She has a current medication list which includes the following prescription(s): aspirin ec, fenofibrate, gabapentin, glucose blood, levothyroxine, magnesium gluconate, centrum silver, nitroglycerin, pantoprazole, pravastatin, and sertraline. Current Outpatient Prescriptions on File Prior to Visit  Medication Sig Dispense Refill  . aspirin EC 81 MG tablet Take 81 mg by mouth daily.    . fenofibrate (TRICOR) 145 MG tablet TAKE ONE TABLET BY MOUTH DAILY 30 tablet 11  . gabapentin (NEURONTIN) 300 MG capsule Take 300 mg by mouth at bedtime.     Marland Kitchen glucose blood (ACCU-CHEK AVIVA) test strip Use to check blood sugar once daily 100 each 3  . levothyroxine (SYNTHROID, LEVOTHROID) 75 MCG tablet Take 1 tablet (75 mcg total) by mouth daily before breakfast. 90 tablet 1  . magnesium gluconate (MAGONATE) 500 MG tablet Take 500 mg by mouth daily.     . Multiple Vitamins-Minerals (CENTRUM SILVER) tablet Take 1 tablet by mouth daily.    . nitroGLYCERIN (NITROSTAT) 0.4 MG SL tablet 1 tablet under tongue at onset of chest pain; you may repeat every 5 minutes for up to 3 doses. 25 tablet 3  . pantoprazole (PROTONIX) 40 MG tablet Take 1 tablet (40 mg total) by mouth daily. 90 tablet 1  . pravastatin (PRAVACHOL) 40 MG tablet TAKE 1 TABLET (40 MG TOTAL) BY MOUTH AT BEDTIME. 90 tablet 2  . sertraline (ZOLOFT) 50 MG tablet TAKE ONE TABLET BY MOUTH DAILY 30 tablet 5   No current facility-administered medications on file prior to visit.    She is allergic to pseudoeph-doxylamine-dm-apap;  gluten; and penicillins.  Review of Systems  Constitutional: Negative for chills, fever and malaise/fatigue.  HENT: Negative for congestion, sinus pain and sore throat.   Eyes: Negative for blurred vision and pain.  Respiratory: Negative for cough and wheezing.   Cardiovascular: Negative for chest pain and leg swelling.  Gastrointestinal: Negative for abdominal pain,  constipation, diarrhea, heartburn, nausea and vomiting.  Genitourinary: Negative for dysuria, frequency, hematuria and urgency.  Musculoskeletal: Negative for back pain, joint pain, myalgias and neck pain.  Skin: Negative for itching and rash.  Neurological: Negative for dizziness, tremors and weakness.  Endo/Heme/Allergies: Does not bruise/bleed easily.  Psychiatric/Behavioral: Negative for depression. The patient is not nervous/anxious and does not have insomnia.    Meds: She  Current Outpatient Prescriptions on File Prior to Visit  Medication Sig Dispense Refill  . aspirin EC 81 MG tablet Take 81 mg by mouth daily.    . fenofibrate (TRICOR) 145 MG tablet TAKE ONE TABLET BY MOUTH DAILY 30 tablet 11  . gabapentin (NEURONTIN) 300 MG capsule Take 300 mg by mouth at bedtime.     Marland Kitchen glucose blood (ACCU-CHEK AVIVA) test strip Use to check blood sugar once daily 100 each 3  . levothyroxine (SYNTHROID, LEVOTHROID) 75 MCG tablet Take 1 tablet (75 mcg total) by mouth daily before breakfast. 90 tablet 1  . magnesium gluconate (MAGONATE) 500 MG tablet Take 500 mg by mouth daily.     . Multiple Vitamins-Minerals (CENTRUM SILVER) tablet Take 1 tablet by mouth daily.    . nitroGLYCERIN (NITROSTAT) 0.4 MG SL tablet 1 tablet under tongue at onset of chest pain; you may repeat every 5 minutes for up to 3 doses. 25 tablet 3  . pantoprazole (PROTONIX) 40 MG tablet Take 1 tablet (40 mg total) by mouth daily. 90 tablet 1  . pravastatin (PRAVACHOL) 40 MG tablet TAKE 1 TABLET (40 MG TOTAL) BY MOUTH AT BEDTIME. 90 tablet 2  . sertraline (ZOLOFT) 50 MG tablet TAKE ONE TABLET BY MOUTH DAILY 30 tablet 5   No current facility-administered medications on file prior to visit.     Objective: Vitals:   08/15/16 1122  BP: 140/80  Pulse: 76   Physical Exam  Constitutional: She is oriented to person, place, and time. She appears well-developed and well-nourished. No distress.  Genitourinary: Rectum normal and vagina  normal. Pelvic exam was performed with patient supine. There is no rash or lesion on the right labia. There is no rash or lesion on the left labia. Vagina exhibits no lesion. No bleeding in the vagina.  Cardiovascular: Normal rate.   Pulmonary/Chest: Effort normal.  Abdominal: Soft. Bowel sounds are normal. She exhibits no distension. There is no tenderness. There is no rebound.  Musculoskeletal: Normal range of motion.  Neurological: She is alert and oriented to person, place, and time.  Skin: Skin is warm and dry.  Psychiatric: She has a normal mood and affect.  Vitals reviewed.   ASSESSMENT/PLAN:  Mild Vaginal atrophy   No problem-specific Assessment & Plan notes found for this encounter.  Problem List Items Addressed This Visit    None    Visit Diagnoses    Vaginal atrophy    -  Primary   Hot flashes        Plan vag est therapy, all other options discussed. See if helps w hot flashes as well Annua, MMG, and f/u 2 mos Consider 3 D MMG as she has had before

## 2016-09-08 ENCOUNTER — Encounter: Payer: Self-pay | Admitting: Family Medicine

## 2016-09-08 ENCOUNTER — Ambulatory Visit (INDEPENDENT_AMBULATORY_CARE_PROVIDER_SITE_OTHER): Payer: 59 | Admitting: Family Medicine

## 2016-09-08 VITALS — BP 124/78 | HR 76 | Temp 98.6°F | Ht 64.0 in | Wt 173.5 lb

## 2016-09-08 DIAGNOSIS — R102 Pelvic and perineal pain: Secondary | ICD-10-CM | POA: Diagnosis not present

## 2016-09-08 DIAGNOSIS — N898 Other specified noninflammatory disorders of vagina: Secondary | ICD-10-CM

## 2016-09-08 DIAGNOSIS — R5383 Other fatigue: Secondary | ICD-10-CM | POA: Diagnosis not present

## 2016-09-08 LAB — CBC WITH DIFFERENTIAL/PLATELET
BASOS PCT: 0.5 % (ref 0.0–3.0)
Basophils Absolute: 0 10*3/uL (ref 0.0–0.1)
EOS PCT: 1.9 % (ref 0.0–5.0)
Eosinophils Absolute: 0.1 10*3/uL (ref 0.0–0.7)
HCT: 40.6 % (ref 36.0–46.0)
HEMOGLOBIN: 13.2 g/dL (ref 12.0–15.0)
LYMPHS ABS: 1.6 10*3/uL (ref 0.7–4.0)
Lymphocytes Relative: 23 % (ref 12.0–46.0)
MCHC: 32.4 g/dL (ref 30.0–36.0)
MCV: 82.2 fl (ref 78.0–100.0)
MONOS PCT: 8.4 % (ref 3.0–12.0)
Monocytes Absolute: 0.6 10*3/uL (ref 0.1–1.0)
NEUTROS PCT: 66.2 % (ref 43.0–77.0)
Neutro Abs: 4.6 10*3/uL (ref 1.4–7.7)
Platelets: 265 10*3/uL (ref 150.0–400.0)
RBC: 4.94 Mil/uL (ref 3.87–5.11)
RDW: 13.9 % (ref 11.5–15.5)
WBC: 6.9 10*3/uL (ref 4.0–10.5)

## 2016-09-08 LAB — COMPREHENSIVE METABOLIC PANEL
ALT: 45 U/L — ABNORMAL HIGH (ref 0–35)
AST: 36 U/L (ref 0–37)
Albumin: 4.6 g/dL (ref 3.5–5.2)
Alkaline Phosphatase: 61 U/L (ref 39–117)
BUN: 15 mg/dL (ref 6–23)
CO2: 29 mEq/L (ref 19–32)
Calcium: 9.8 mg/dL (ref 8.4–10.5)
Chloride: 105 mEq/L (ref 96–112)
Creatinine, Ser: 0.79 mg/dL (ref 0.40–1.20)
GFR: 78.83 mL/min (ref >60.00)
Glucose, Bld: 116 mg/dL — ABNORMAL HIGH (ref 70–99)
Potassium: 4.4 mEq/L (ref 3.5–5.1)
Sodium: 142 mEq/L (ref 135–145)
Total Bilirubin: 0.4 mg/dL (ref 0.2–1.2)
Total Protein: 7.3 g/dL (ref 6.0–8.3)

## 2016-09-08 LAB — T4, FREE: Free T4: 0.82 ng/dL (ref 0.60–1.60)

## 2016-09-08 LAB — TSH: TSH: 4.1 u[IU]/mL (ref 0.35–4.50)

## 2016-09-08 LAB — T3, FREE: T3, Free: 3.4 pg/mL (ref 2.3–4.2)

## 2016-09-08 NOTE — Progress Notes (Signed)
Subjective:    Patient ID: Brandi Mahoney, female    DOB: [birth year], 61 y.o.   MRN: [id number]  HPI    61 year old postmenopausal  female presents with new onset  Vaginal soreness. She has vaginal pain with sex, feels tight vaginally. Vaginal lips are sore. No discharge.  No abdominal pain. Constipation off and on.   Initially saw GYN for pain with sex, exeternal vaginal pain.   Trial of hormonal vaginal cream.  Trid OTC monistat.  Tried neosporin externally.  No relief.   She has  Noted exernal vaginal lesion.Martin Majestic to ER.  Felt was a labial abscess.. I and D. Also treated with Bactrim.. Now resolved.   History of TAH. Was on hormones  For several years.. Off in last 10-15 years.   In last 6 months she has had issues sleeping, falling asleep.  Daytime sleepiness. She feels heat intolerance.  No jitteriness, no palpitations.   Nml thyroid in 04/2016   depression well controlled on zoloft.   She has also noted ST in last 2 -3 days.  Review of Systems  Constitutional: Negative for fatigue and fever.  HENT: Negative for congestion and ear pain.   Eyes: Negative for pain.  Respiratory: Negative for cough, chest tightness and shortness of breath.   Cardiovascular: Negative for chest pain, palpitations and leg swelling.  Gastrointestinal: Negative for abdominal pain.  Genitourinary: Negative for dysuria.  Musculoskeletal: Negative for back pain.  Neurological: Negative for syncope, light-headedness and headaches.  Psychiatric/Behavioral: Negative for dysphoric mood.       Objective:   Physical Exam  Constitutional: Vital signs are normal. She appears well-developed and well-nourished. She is cooperative.  Non-toxic appearance. She does not appear ill. No distress.  HENT:  Head: Normocephalic.  Right Ear: Hearing, tympanic membrane, external ear and ear canal normal. Tympanic membrane is not erythematous, not retracted and not bulging.  Left Ear: Hearing, tympanic  membrane, external ear and ear canal normal. Tympanic membrane is not erythematous, not retracted and not bulging.  Nose: No mucosal edema or rhinorrhea. Right sinus exhibits no maxillary sinus tenderness and no frontal sinus tenderness. Left sinus exhibits no maxillary sinus tenderness and no frontal sinus tenderness.  Mouth/Throat: Uvula is midline, oropharynx is clear and moist and mucous membranes are normal.  Eyes: Conjunctivae, EOM and lids are normal. Pupils are equal, round, and reactive to light. Lids are everted and swept, no foreign bodies found.  Neck: Trachea normal and normal range of motion. Neck supple. Carotid bruit is not present. No thyroid mass and no thyromegaly present.  Cardiovascular: Normal rate, regular rhythm, S1 normal, S2 normal, normal heart sounds, intact distal pulses and normal pulses.  Exam reveals no gallop and no friction rub.   No murmur heard. Pulmonary/Chest: Effort normal and breath sounds normal. No tachypnea. No respiratory distress. She has no decreased breath sounds. She has no wheezes. She has no rhonchi. She has no rales.  Abdominal: Soft. Normal appearance and bowel sounds are normal. There is no tenderness.  Genitourinary: There is tenderness on the right labia. There is no rash, lesion or injury on the right labia. There is tenderness on the left labia. There is no rash, lesion or injury on the left labia. There is erythema in the vagina. No tenderness in the vagina. No vaginal discharge found.  Genitourinary Comments: Erythema vaginally and on labia  Neurological: She is alert.  Skin: Skin is warm, dry and intact. No rash noted.  Psychiatric:  Her speech is normal and behavior is normal. Judgment and thought content normal. Her mood appears not anxious. Cognition and memory are normal. She does not exhibit a depressed mood.          Assessment & Plan:

## 2016-09-08 NOTE — Progress Notes (Signed)
Pre visit review using our clinic review tool, if applicable. No additional management support is needed unless otherwise documented below in the visit note. 

## 2016-09-08 NOTE — Patient Instructions (Signed)
Stop all vaginal creams, summers eve, ointments.  Wash with warm water vaginally only. Apply topical OTC hydrocortisone cream twice daily x 1-2 weeks.. if no improvement, stop.  Please stop at the lab to set up to have labs drawn. If not improving follow up with GYN.

## 2016-09-11 LAB — HERPES SIMPLEX VIRUS CULTURE: ORGANISM ID, BACTERIA: NOT DETECTED

## 2016-09-13 ENCOUNTER — Telehealth: Payer: Self-pay | Admitting: Family Medicine

## 2016-09-13 ENCOUNTER — Encounter: Payer: Self-pay | Admitting: Gastroenterology

## 2016-09-13 NOTE — Telephone Encounter (Signed)
Pt calling checking on her labs results from last week

## 2016-09-13 NOTE — Telephone Encounter (Signed)
Spoke with Mr. Troop and advised [name]'s lab results are normal.  Lab results have not been reviewed by Dr. Diona Browner yet due to she has been out of the office this week.  Results should be released to MyChart tomorrow once Dr. Diona Browner is back in the office.

## 2016-09-24 ENCOUNTER — Other Ambulatory Visit: Payer: Self-pay | Admitting: Family Medicine

## 2016-09-26 ENCOUNTER — Encounter: Payer: Self-pay | Admitting: Obstetrics & Gynecology

## 2016-09-26 ENCOUNTER — Ambulatory Visit (INDEPENDENT_AMBULATORY_CARE_PROVIDER_SITE_OTHER): Payer: 59 | Admitting: Obstetrics & Gynecology

## 2016-09-26 VITALS — BP 122/80 | HR 79 | Ht 64.0 in | Wt 174.0 lb

## 2016-09-26 DIAGNOSIS — N952 Postmenopausal atrophic vaginitis: Secondary | ICD-10-CM | POA: Diagnosis not present

## 2016-09-26 DIAGNOSIS — N9419 Other specified dyspareunia: Secondary | ICD-10-CM

## 2016-09-26 DIAGNOSIS — Z1231 Encounter for screening mammogram for malignant neoplasm of breast: Secondary | ICD-10-CM

## 2016-09-26 DIAGNOSIS — Z1239 Encounter for other screening for malignant neoplasm of breast: Secondary | ICD-10-CM

## 2016-09-26 MED ORDER — OSPEMIFENE 60 MG PO TABS: 1.0000 | ORAL_TABLET | Freq: Every day | ORAL | 11 refills | Status: DC

## 2016-09-26 NOTE — Progress Notes (Signed)
  History of Present Illness:  Brandi Mahoney is a 61 y.o. who was started on Estrace vaginal cream approximately 6 weeks ago. Since that time, she states that her symptoms did not improve after one week and due to irritative side effects locally she stopped medicine.  PMHx: She  has a past medical history of Asthma; CAD (coronary artery disease); Depression; Hemorrhoids; History of pneumonia (1969); HLD (hyperlipidemia); Hypothyroidism; IBS (irritable bowel syndrome); Migraine headache; Myocarditis ([place]); and Prediabetes. Also,  has a past surgical history that includes Appendectomy (1974); Cholecystectomy (2002); Total abdominal hysterectomy w/ bilateral salpingoophorectomy (2000); Cardiac catheterization (8/11); Nephrolithotomy (Right, 08/20/2014); Colonoscopy (2013); Breast biopsy (Right, 1988); and Cyst excision., family history includes ALS in her mother; Breast cancer in her paternal grandmother; Breast cancer (age of onset: 75) in her sister; Cancer (age of onset: 65) in her sister; Colon cancer in her maternal grandfather; Coronary artery disease in her mother; Diabetes in her mother; Down syndrome in her son; Hyperlipidemia in her father; Lung cancer in her paternal grandfather; Other in her daughter.,  reports that she quit smoking about 8 years ago. Her smoking use included Cigarettes. She has a 12.25 pack-year smoking history. She has never used smokeless tobacco. She reports that she drinks alcohol. She reports that she does not use drugs.  She has a current medication list which includes the following prescription(s): aspirin ec, estradiol, fenofibrate, gabapentin, glucose blood, levothyroxine, magnesium gluconate, centrum silver, nitroglycerin, ospemifene, pantoprazole, pravastatin, and sertraline. Also, is allergic to pseudoeph-doxylamine-dm-apap; gluten; and penicillins.  Review of Systems  Constitutional: Negative for chills, fever and malaise/fatigue.  HENT: Negative for congestion,  sinus pain and sore throat.   Eyes: Negative for blurred vision and pain.  Respiratory: Negative for cough and wheezing.   Cardiovascular: Negative for chest pain and leg swelling.  Gastrointestinal: Negative for abdominal pain, constipation, diarrhea, heartburn, nausea and vomiting.  Genitourinary: Negative for dysuria, frequency, hematuria and urgency.  Musculoskeletal: Negative for back pain, joint pain, myalgias and neck pain.  Skin: Negative for itching and rash.  Neurological: Negative for dizziness, tremors and weakness.  Endo/Heme/Allergies: Does not bruise/bleed easily.  Psychiatric/Behavioral: Negative for depression. The patient is not nervous/anxious and does not have insomnia.    Physical Exam:  BP 122/80   Pulse 79   Ht 5\' 4"  (1.626 m)   Wt 174 lb (78.9 kg)   BMI 29.87 kg/m  Body mass index is 29.87 kg/m. Constitutional: Well nourished, well developed female in no acute distress.  Abdomen: diffusely non tender to palpation, non distended, and no masses, hernias Neuro: Grossly intact Psych:  Normal mood and affect.    Assessment: Medication treatment is going poorly for her VAGINAL ATROPHY.  Plan: She will undergo discontinue of Estrace in her medical therapy.  Options of Osphena, Replens, Intrarosa, HRT discussed as options for her vaginal dryness and dyspareunia discussed; pros and cons and SE.  Prefers Osphena.  Info gv and Rx.  f/u 2 mos.  Consider vulvar biopsy of skin if continues to be irritative after this therapy.  She was amenable to this plan and we will see her back for annual/PRN.  Barnett Applebaum, MD, [facility], [facility] 09/26/2016  9:09 AM

## 2016-09-26 NOTE — Patient Instructions (Signed)
Ospemifene oral tablets What is this medicine? OSPEMIFENE (os PEM i feen) is used to treat painful sexual intercourse in females after menopause, a symptom of menopause that occurs due to changes in and around the vagina. This medicine may be used for other purposes; ask your health care provider or pharmacist if you have questions. COMMON BRAND NAME(S): Osphena What should I tell my health care provider before I take this medicine? They need to know if you have any of these conditions: -cancer, such as breast, uterine, or other cancer -heart disease -history of blood clots -history of stroke -history of vaginal bleeding -liver disease -premenopausal -smoke tobacco -an unusual or allergic reaction to ospemifene, other medicines, foods, dyes, or preservatives -pregnant or trying to get pregnant -breast-feeding How should I use this medicine? Take this medicine by mouth with a glass of water. Take this medicine with food. Follow the directions on the prescription label. Do not take your medicine more often than directed. Talk to your pediatrician regarding the use of this medicine in children. Special care may be needed. Overdosage: If you think you have taken too much of this medicine contact a poison control center or emergency room at once. NOTE: This medicine is only for you. Do not share this medicine with others. What if I miss a dose? If you miss a dose, take it as soon as you can. If it is almost time for your next dose, take only that dose. Do not take double or extra doses. What may interact with this medicine? -doxycycline -estrogens -fluconazole -furosemide -glyburide -ketoconazole -phenytoin -rifampin -warfarin This list may not describe all possible interactions. Give your health care provider a list of all the medicines, herbs, non-prescription drugs, or dietary supplements you use. Also tell them if you smoke, drink alcohol, or use illegal drugs. Some items may  interact with your medicine. What should I watch for while using this medicine? Visit your health care professional for regular checks on your progress. You will need a regular breast and pelvic exam and Pap smear while on this medicine. You should also discuss the need for regular mammograms with your health care professional, and follow his or her guidelines for these tests. Also, periodically discuss the need to continue taking this medicine. Taking this medicine for long periods of time may increase your risk for serious side effects. This medicine can increase the risk of developing a condition (endometrial hyperplasia) that may lead to cancer of the lining of the uterus. Taking progestins, another hormone drug, with this medicine lowers the risk of developing this condition. Therefore, if your uterus has not been removed (by a hysterectomy), your doctor may prescribe a progestin for you to take together with your estrogen. You should know, however, that taking estrogens with progestins may have additional health risks. You should discuss the use of estrogens and progestins with your health care professional to determine the benefits and risks for you. This medicine can rarely cause blood clots. You should avoid long periods of bed rest while taking this medicine. If you are going to have surgery, tell your doctor or health care professional that you are taking this medicine. This medicine should be stopped at least 4-6 weeks before surgery. After surgery, it should be restarted only after you are walking again. It should not be restarted while you still need long periods of bed rest. You should not smoke while taking this medicine. Smoking may also increase your risk of blood clots. Smoking can also   decrease the effects of this medicine. This medicine does not prevent hot flashes. It may cause hot flashes in some patients. If you have any reason to think you are pregnant; stop taking this medicine at  once and contact your doctor or health care professional. What side effects may I notice from receiving this medicine? Side effects that you should report to your doctor or health care professional as soon as possible: -breathing problems -changes in vision -confusion, trouble speaking or understanding -new breast lumps -pain, swelling, warmth in the leg -pelvic pain or pressure -severe headaches -sudden chest pain -sudden numbness or weakness of the face, arm or leg -trouble walking, dizziness, loss of balance or coordination -unusual vaginal bleeding patterns -vaginal discharge that is bloody or brown Side effects that usually do not require medical attention (report to your doctor or health care professional if they continue or are bothersome): -hot flushes or flashes -increased sweating -muscle cramps -vaginal discharge (white or clear) This list may not describe all possible side effects. Call your doctor for medical advice about side effects. You may report side effects to FDA at 1-800-FDA-1088. Where should I keep my medicine? Keep out of the reach of children. Store at room temperature between 20 and 25 degrees C (68 and 77 degrees F). Protect from light. Keep container tightly closed. Throw away any unused medicine after the expiration date. NOTE: This sheet is a summary. It may not cover all possible information. If you have questions about this medicine, talk to your doctor, pharmacist, or health care provider.  2018 Elsevier/Gold Standard (2015-07-08 10:08:00)  

## 2016-10-04 ENCOUNTER — Telehealth: Payer: Self-pay | Admitting: Obstetrics & Gynecology

## 2016-10-04 NOTE — Telephone Encounter (Signed)
Pt's husband Nicole Kindred came in the office to advise that pt's sore on her vagina has gotten bigger and is really red. Nicole Kindred stated it has gotten worse since her last OV on 09/26/16. Nicole Kindred is requesting that pt be worked in this week if possible if not this week asap. Please advise. Thanks TNP

## 2016-10-04 NOTE — Telephone Encounter (Signed)
WI today 230

## 2016-10-09 NOTE — Telephone Encounter (Signed)
msg left

## 2016-10-19 NOTE — Telephone Encounter (Signed)
Patient worked in Friday, 5/4.

## 2016-10-20 ENCOUNTER — Encounter: Payer: Self-pay | Admitting: Obstetrics & Gynecology

## 2016-10-20 ENCOUNTER — Ambulatory Visit (INDEPENDENT_AMBULATORY_CARE_PROVIDER_SITE_OTHER): Payer: 59 | Admitting: Obstetrics & Gynecology

## 2016-10-20 VITALS — BP 122/74 | Ht 64.0 in | Wt 171.0 lb

## 2016-10-20 DIAGNOSIS — N952 Postmenopausal atrophic vaginitis: Secondary | ICD-10-CM

## 2016-10-20 DIAGNOSIS — N9089 Other specified noninflammatory disorders of vulva and perineum: Secondary | ICD-10-CM

## 2016-10-20 DIAGNOSIS — L900 Lichen sclerosus et atrophicus: Secondary | ICD-10-CM | POA: Diagnosis not present

## 2016-10-20 NOTE — Patient Instructions (Signed)
Lichen Sclerosus Lichen sclerosus is a skin problem. It can happen on any part of the body, but it commonly involves the anal or genital areas. It can cause itching and discomfort in these areas. Treatment can help to control symptoms. When the genital area is affected, getting treatment is important because the condition can cause scarring that may lead to other problems. What are the causes? The cause of this condition is not known. It could be the result of an overactive immune system or a lack of certain hormones. Lichen sclerosus is not an infection or a fungus. It is not passed from one person to another (not contagious). What increases the risk? This condition is more likely to develop in women, usually after menopause. What are the signs or symptoms? Symptoms of this condition include:  Thin, wrinkled, white areas on the skin.  Thickened white areas on the skin.  Red and swollen patches (lesions) on the skin.  Tears or cracks in the skin.  Bruising.  Blood blisters.  Severe itching. You may also have pain, itching, or burning with urination. Constipation is also common in people with lichen sclerosus. How is this diagnosed? This condition may be diagnosed with a physical exam. In some cases, a tissue sample (biopsy sample) may be removed to be looked at under a microscope. How is this treated? This condition is usually treated with medicated creams or ointments (topical steroids) that are applied over the affected areas. Follow these instructions at home:  Take over-the-counter and prescription medicines only as told by your health care provider.  Use creams or ointments as told by your health care provider.  Do not scratch the affected areas of skin.  Women should keep the vaginal area as clean and dry as possible.  Keep all follow-up visits as told by your health care provider. This is important. Contact a health care provider if:  You have increasing redness,  swelling, or pain in the affected area.  You have fluid, blood, or pus coming from the affected area.  You have new lesions on your skin.  You have a fever.  You have pain during sex. This information is not intended to replace advice given to you by your health care provider. Make sure you discuss any questions you have with your health care provider. Document Released: 10/26/2010 Document Revised: 11/11/2015 Document Reviewed: 08/31/2014 Elsevier Interactive Patient Education  2017 [employer].

## 2016-10-20 NOTE — Progress Notes (Signed)
History of Present Illness:  Brandi Mahoney is a 60 y.o. who was started on Osphena approximately 4 weeks ago. Since that time, she states that her symptoms show no change.  ALSO, still having irritation on perineum.  PMHx: She  has a past medical history of Asthma; CAD (coronary artery disease); Depression; Hemorrhoids; History of pneumonia (1969); HLD (hyperlipidemia); Hypothyroidism; IBS (irritable bowel syndrome); Migraine headache; Myocarditis ([place]); and Prediabetes. Also,  has a past surgical history that includes Appendectomy (1974); Cholecystectomy (2002); Total abdominal hysterectomy w/ bilateral salpingoophorectomy (2000); Cardiac catheterization (8/11); Nephrolithotomy (Right, 08/20/2014); Colonoscopy (2013); Breast biopsy (Right, 1988); and Cyst excision., family history includes ALS in her mother; Breast cancer in her paternal grandmother; Breast cancer (age of onset: 57) in her sister; Cancer (age of onset: 73) in her sister; Colon cancer in her maternal grandfather; Coronary artery disease in her mother; Diabetes in her mother; Down syndrome in her son; Hyperlipidemia in her father; Lung cancer in her paternal grandfather; Other in her daughter.,  reports that she quit smoking about 8 years ago. Her smoking use included Cigarettes. She has a 12.25 pack-year smoking history. She has never used smokeless tobacco. She reports that she drinks alcohol. She reports that she does not use drugs.  She has a current medication list which includes the following prescription(s): aspirin ec, estradiol, fenofibrate, gabapentin, glucose blood, levothyroxine, magnesium gluconate, centrum silver, nitroglycerin, ospemifene, pantoprazole, pravastatin, and sertraline. Also, is allergic to pseudoeph-doxylamine-dm-apap; gluten; and penicillins.  Review of Systems  Constitutional: Negative for chills, fever and malaise/fatigue.  HENT: Negative for congestion, sinus pain and sore throat.   Eyes: Negative for  blurred vision and pain.  Respiratory: Negative for cough and wheezing.   Cardiovascular: Negative for chest pain and leg swelling.  Gastrointestinal: Negative for abdominal pain, constipation, diarrhea, heartburn, nausea and vomiting.  Genitourinary: Negative for dysuria, frequency, hematuria and urgency.  Musculoskeletal: Negative for back pain, joint pain, myalgias and neck pain.  Skin: Negative for itching and rash.  Neurological: Negative for dizziness, tremors and weakness.  Endo/Heme/Allergies: Does not bruise/bleed easily.  Psychiatric/Behavioral: Negative for depression. The patient is not nervous/anxious and does not have insomnia.     Physical Exam:  BP 122/74   Ht 5\' 4"  (1.626 m)   Wt 171 lb (77.6 kg)   BMI 29.35 kg/m  Body mass index is 29.35 kg/m. Constitutional: Well nourished, well developed female in no acute distress.  Abdomen: diffusely non tender to palpation, non distended, and no masses, hernias Neuro: Grossly intact Psych:  Normal mood and affect.     Pelvic:   Vulva: Normal appearance.  Pale tissue around perineum and lower vulva; thickening at introitus 6 o'clock position  Vagina: Atrophy.  No lesions or abnormalities noted.  Support: Normal pelvic support.  Urethra No masses tenderness or scarring.  Meatus Normal size without lesions or prolapse.    Assessment:  Problem List Items Addressed This Visit      Genitourinary   Vaginal atrophy - Primary    Other Visit Diagnoses    Vulvar lesion       Relevant Orders   Pathology     Medication treatment is going marginally for her atrophy, will cont Osphena to give appropriate time for dryness and dyspareunia tx.  Plan: Vulvar biopsy today to evaluate for LS, VIN, cancer.  VULVAR BIOPSY NOTE The indications for vulvar biopsy (rule out neoplasia, establish lichen sclerosus diagnosis) were reviewed.   Risks of the biopsy including pain, bleeding, infection, inadequate  specimen, scarring and need for  additional procedures  were discussed. The patient stated understanding and agreed to undergo procedure today. Consent was signed,  time out performed.   The patient's vulva was prepped with Betadine. 1% lidocaine was injected into area of concern. A 3 -mm punch biopsy was done, biopsy tissue was picked up with sterile forceps and sterile scissors were used to excise the lesion.  Small bleeding was noted and hemostasis was achieved using a 3-0 vicryl suture.  The patient tolerated the procedure well. Post-procedure instructions  (pelvic rest for one week) were given to the patient. The patient is to call with heavy bleeding, fever greater than 100.4, foul smelling vaginal discharge or other concerns.   Barnett Applebaum, MD, [facility], [facility] 10/20/2016  11:43 AM

## 2016-10-25 LAB — PATHOLOGY

## 2016-10-31 ENCOUNTER — Encounter: Payer: Self-pay | Admitting: Obstetrics & Gynecology

## 2016-10-31 ENCOUNTER — Ambulatory Visit (INDEPENDENT_AMBULATORY_CARE_PROVIDER_SITE_OTHER): Payer: 59 | Admitting: Obstetrics & Gynecology

## 2016-10-31 VITALS — BP 100/60 | HR 79 | Ht 64.0 in | Wt 174.0 lb

## 2016-10-31 DIAGNOSIS — L900 Lichen sclerosus et atrophicus: Secondary | ICD-10-CM

## 2016-10-31 MED ORDER — CLOBETASOL PROPIONATE 0.05 % EX CREA: 1.0000 "application " | TOPICAL_CREAM | Freq: Two times a day (BID) | CUTANEOUS | 3 refills | Status: DC

## 2016-10-31 NOTE — Progress Notes (Signed)
  HPI: Pt presents in follow up from her vulvar biopsy due to irritative and dry vulva, also has dyspareunia and vag dryness, has been on Osphena for 3 weeks.  Biopsy results c/w Lichen Sclerosus, discussed today.  PMHx: She  has a past medical history of Asthma; CAD (coronary artery disease); Depression; Hemorrhoids; History of pneumonia (1969); HLD (hyperlipidemia); Hypothyroidism; IBS (irritable bowel syndrome); Migraine headache; Myocarditis ([place]); and Prediabetes. Also,  has a past surgical history that includes Appendectomy (1974); Cholecystectomy (2002); Total abdominal hysterectomy w/ bilateral salpingoophorectomy (2000); Cardiac catheterization (8/11); Nephrolithotomy (Right, 08/20/2014); Colonoscopy (2013); Breast biopsy (Right, 1988); and Cyst excision., family history includes ALS in her mother; Breast cancer in her paternal grandmother; Breast cancer (age of onset: 71) in her sister; Cancer (age of onset: 15) in her sister; Colon cancer in her maternal grandfather; Coronary artery disease in her mother; Diabetes in her mother; Down syndrome in her son; Hyperlipidemia in her father; Lung cancer in her paternal grandfather; Other in her daughter.,  reports that she quit smoking about 8 years ago. Her smoking use included Cigarettes. She has a 12.25 pack-year smoking history. She has never used smokeless tobacco. She reports that she drinks alcohol. She reports that she does not use drugs.  She has a current medication list which includes the following prescription(s): aspirin ec, cyclobenzaprine, estradiol, fenofibrate, gabapentin, glucose blood, levothyroxine, magnesium gluconate, centrum silver, nitroglycerin, ospemifene, pantoprazole, pravastatin, sertraline, and clobetasol cream. Also, is allergic to pseudoeph-doxylamine-dm-apap; gluten; and penicillins.  Review of Systems  Constitutional: Negative for chills, fever and malaise/fatigue.  HENT: Negative for congestion, sinus pain and sore  throat.   Eyes: Negative for blurred vision and pain.  Respiratory: Negative for cough and wheezing.   Cardiovascular: Negative for chest pain and leg swelling.  Gastrointestinal: Negative for abdominal pain, constipation, diarrhea, heartburn, nausea and vomiting.  Genitourinary: Negative for dysuria, frequency, hematuria and urgency.  Musculoskeletal: Negative for back pain, joint pain, myalgias and neck pain.  Skin: Negative for itching and rash.  Neurological: Negative for dizziness, tremors and weakness.  Endo/Heme/Allergies: Does not bruise/bleed easily.  Psychiatric/Behavioral: Negative for depression. The patient is not nervous/anxious and does not have insomnia.     Objective: BP 100/60   Pulse 79   Ht 5\' 4"  (1.626 m)   Wt 174 lb (78.9 kg)   BMI 29.87 kg/m   Physical examination Constitutional NAD, Conversant  Skin No rashes, lesions or ulceration.   Extremities: Moves all appropriately.  Normal ROM for age. No lymphadenopathy.  Neuro: Grossly intact  Psych: Oriented to PPT.  Normal mood. Normal affect.   Assessment:  Lichen sclerosus Clobetasol discussed, prescribed. F/u as needed, cont to monitor effects of Osphena for 6-8 weeks before decision made to whether it is helping op not.  Barnett Applebaum, MD, [facility], [facility] 10/31/2016  2:05 PM

## 2016-10-31 NOTE — Patient Instructions (Signed)
Clobetasol Propionate skin cream What is this medicine? CLOBETASOL (kloe BAY ta sol) is a corticosteroid. It is used on the skin to treat itching, redness, and swelling caused by some skin conditions. This medicine may be used for other purposes; ask your health care provider or pharmacist if you have questions. COMMON BRAND NAME(S): Cormax, Embeline, Embeline E, Impoyz, Temovate, Temovate E What should I tell my health care provider before I take this medicine? They need to know if you have any of these conditions: -any type of active infection including measles, tuberculosis, herpes, or chickenpox -circulation problems or vascular disease -large areas of burned or damaged skin -rosacea -skin wasting or thinning -an unusual or allergic reaction to clobetasol, corticosteroids, other medicines, foods, dyes, or preservatives -pregnant or trying to get pregnant -breast-feeding How should I use this medicine? This medicine is for external use only. Do not take by mouth. Follow the directions on the prescription label. Wash your hands before and after use. Apply a thin film of medicine to the affected area. Do not cover with a bandage or dressing unless your doctor or health care professional tells you to. Do not get this medicine in your eyes. If you do, rinse out with plenty of cool tap water. It is important not to use more medicine than prescribed. Do not use your medicine more often than directed. To do so may increase the chance of side effects. Talk to your pediatrician regarding the use of this medicine in children. Special care may be needed. Elderly patients are more likely to have damaged skin through aging, and this may increase side effects. This medicine should only be used for brief periods and infrequently in older patients. Overdosage: If you think you have taken too much of this medicine contact a poison control center or emergency room at once. NOTE: This medicine is only for you.  Do not share this medicine with others. What if I miss a dose? If you miss a dose, use it as soon as you can. If it is almost time for your next dose, use only that dose. Do not use double or extra doses. What may interact with this medicine? Interactions are not expected. Do not use cosmetics or other skin care products on the treated area. This list may not describe all possible interactions. Give your health care provider a list of all the medicines, herbs, non-prescription drugs, or dietary supplements you use. Also tell them if you smoke, drink alcohol, or use illegal drugs. Some items may interact with your medicine. What should I watch for while using this medicine? Tell your doctor or health care professional if your symptoms do not get better within 2 weeks, or if you develop skin irritation from the medicine. Tell your doctor or health care professional if you are exposed to anyone with measles or chickenpox, or if you develop sores or blisters that do not heal properly. What side effects may I notice from receiving this medicine? Side effects that you should report to your doctor or health care professional as soon as possible: -allergic reactions like skin rash, itching or hives, swelling of the face, lips, or tongue -changes in vision -lack of healing of the skin condition -painful, red, pus filled blisters on the skin or in hair follicles -thinning of the skin with easy bruising Side effects that usually do not require medical attention (report to your doctor or health care professional if they continue or are bothersome): -burning, irritation of the skin -redness  or scaling of the skin This list may not describe all possible side effects. Call your doctor for medical advice about side effects. You may report side effects to FDA at 1-800-FDA-1088. Where should I keep my medicine? Keep out of the reach of children. Store at room temperature between 15 and 30 degrees C (59 and 86  degrees F). Keep away from heat and direct light. Do not freeze. Throw away any unused medicine after the expiration date. NOTE: This sheet is a summary. It may not cover all possible information. If you have questions about this medicine, talk to your doctor, pharmacist, or health care provider.  2018 Elsevier/Gold Standard (2007-09-11 16:56:45)  

## 2016-11-06 DIAGNOSIS — N898 Other specified noninflammatory disorders of vagina: Secondary | ICD-10-CM | POA: Insufficient documentation

## 2016-11-06 DIAGNOSIS — R5383 Other fatigue: Secondary | ICD-10-CM | POA: Insufficient documentation

## 2016-11-06 DIAGNOSIS — R102 Pelvic and perineal pain: Secondary | ICD-10-CM | POA: Insufficient documentation

## 2016-11-06 NOTE — Assessment & Plan Note (Signed)
Irritation vaginally and on labia diffusely. Treat with topical steroid and stop multiple products.  Wash with only warm water.  Follow up with [facility] if not improving.

## 2016-11-06 NOTE — Assessment & Plan Note (Signed)
Likely multifactorial. Will begin by evaluating with labs.

## 2016-11-09 ENCOUNTER — Other Ambulatory Visit: Payer: Self-pay | Admitting: Family Medicine

## 2016-12-06 ENCOUNTER — Ambulatory Visit: Payer: 59 | Admitting: Obstetrics & Gynecology

## 2017-02-18 ENCOUNTER — Other Ambulatory Visit: Payer: Self-pay | Admitting: Family Medicine

## 2017-02-23 ENCOUNTER — Telehealth: Payer: Self-pay

## 2017-02-23 NOTE — Telephone Encounter (Signed)
Pt states [facility] had rx'd her Osphena & clobetasol cream (TEMOVATE) 0.05 % cream and neither are working for her. She would like to see if there are any other options for her to try. Cb#[phone].

## 2017-02-26 NOTE — Telephone Encounter (Signed)
Appt to eval effect of steroid, and can discuss then alternatives to [facility]

## 2017-02-27 NOTE — Telephone Encounter (Signed)
Leave for patient to call back to be schedule for medication follow up

## 2017-02-27 NOTE — Telephone Encounter (Signed)
Call pt and schedule appt please with [facility]

## 2017-03-07 NOTE — Telephone Encounter (Signed)
Called and left voice mail for patient to call back to be schedule °

## 2017-03-13 ENCOUNTER — Encounter: Payer: Self-pay | Admitting: Obstetrics & Gynecology

## 2017-03-13 ENCOUNTER — Ambulatory Visit (INDEPENDENT_AMBULATORY_CARE_PROVIDER_SITE_OTHER): Payer: 59 | Admitting: Obstetrics & Gynecology

## 2017-03-13 VITALS — BP 120/70 | HR 85 | Ht 64.0 in | Wt 176.0 lb

## 2017-03-13 DIAGNOSIS — N952 Postmenopausal atrophic vaginitis: Secondary | ICD-10-CM

## 2017-03-13 DIAGNOSIS — L900 Lichen sclerosus et atrophicus: Secondary | ICD-10-CM

## 2017-03-13 MED ORDER — PRASTERONE 6.5 MG VA INST: 6.5000 mg | VAGINAL_INSERT | Freq: Every day | VAGINAL | 11 refills | Status: DC

## 2017-03-13 NOTE — Progress Notes (Signed)
History of Present Illness:  Brandi Mahoney is a 61 y.o. who was started on  Osphena and Clobetasol approximately 3 months ago. Since that time, she states that her symptoms show no change in dryness and dyspareunia; some change in ext itching but still has to use steroid cream at times intermittantly.  Stopped Osphena after 5 weeks due to lack of success; had SE of hot flashes.  PMHx: She  has a past medical history of Asthma; CAD (coronary artery disease); Depression; Hemorrhoids; History of pneumonia (1969); HLD (hyperlipidemia); Hypothyroidism; IBS (irritable bowel syndrome); Migraine headache; Myocarditis ([place]); and Prediabetes. Also,  has a past surgical history that includes Appendectomy (1974); Cholecystectomy (2002); Total abdominal hysterectomy w/ bilateral salpingoophorectomy (2000); Cardiac catheterization (8/11); Nephrolithotomy (Right, 08/20/2014); Colonoscopy (2013); Breast biopsy (Right, 1988); and Cyst excision., family history includes ALS in her mother; Breast cancer in her paternal grandmother; Breast cancer (age of onset: 50) in her sister; Cancer (age of onset: 25) in her sister; Colon cancer in her maternal grandfather; Coronary artery disease in her mother; Diabetes in her mother; Down syndrome in her son; Hyperlipidemia in her father; Lung cancer in her paternal grandfather; Other in her daughter.,  reports that she quit smoking about 8 years ago. Her smoking use included Cigarettes. She has a 12.25 pack-year smoking history. She has never used smokeless tobacco. She reports that she drinks alcohol. She reports that she does not use drugs.  Current Outpatient Prescriptions:  .  aspirin EC 81 MG tablet, Take 81 mg by mouth daily.  .  clobetasol cream (TEMOVATE) 0.17 %, Apply 1 application topically 2 (two) times daily. Apply for 2 weeks.  Then use PRN for symptoms of lichen sclerosus., Disp: 45 g, Rfl: 3 .  cyclobenzaprine (FLEXERIL) 10 MG tablet, TAKE 1 TABLET (10 MG TOTAL) BY  MOUTH 2 (TWO) TIMES DAILY AS NEEDED (PAIN)., Disp: , Rfl:  .  estradiol (ESTRACE VAGINAL) 0.1 MG/GM vaginal cream, Place 1 Applicatorful vaginally 2 (two) times a week., Disp: 42.5 g, Rfl: 3 .  fenofibrate (TRICOR) 145 MG tablet, TAKE ONE TABLET BY MOUTH DAILY, Disp: 30 tablet, Rfl: 11 .  gabapentin (NEURONTIN) 300 MG capsule, Take 300 mg by mouth at bedtime. , Disp: , Rfl:  .  glucose blood (ACCU-CHEK AVIVA) test strip, Use to check blood sugar once daily, Disp: 100 each, Rfl: 3 .  levothyroxine (SYNTHROID, LEVOTHROID) 75 MCG tablet, TAKE ONE TABLET BY MOUTH EVERY MORNING BEFORE BREAKFAST, Disp: 90 tablet, Rfl: 3 .  magnesium gluconate (MAGONATE) 500 MG tablet, Take 500 mg by mouth daily. , Disp: , Rfl:  .  Multiple Vitamins-Minerals (CENTRUM SILVER) tablet, Take 1 tablet by mouth daily., Disp: , Rfl:  .  nitroGLYCERIN (NITROSTAT) 0.4 MG SL tablet, 1 tablet under tongue at onset of chest pain; you may repeat every 5 minutes for up to 3 doses., Disp: 25 tablet, Rfl: 3 .  pantoprazole (PROTONIX) 40 MG tablet, TAKE ONE TABLET BY MOUTH DAILY, Disp: 30 tablet, Rfl: 0 .  pravastatin (PRAVACHOL) 40 MG tablet, TAKE 1 TABLET (40 MG TOTAL) BY MOUTH AT BEDTIME., Disp: 90 tablet, Rfl: 2 .  sertraline (ZOLOFT) 50 MG tablet, TAKE ONE TABLET BY MOUTH DAILY, Disp: 30 tablet, Rfl: 0 - Osphena (discontinued by pt.) Also, is allergic to pseudoeph-doxylamine-dm-apap; gluten; and penicillins..  Review of Systems  All other systems reviewed and are negative.  Physical Exam:  BP 120/70   Pulse 85   Ht 5\' 4"  (1.626 m)   Wt 176  lb (79.8 kg)   BMI 30.21 kg/m  Body mass index is 30.21 kg/m. Physical examination Constitutional NAD, Conversant  Thyroid/Neck Smooth without nodularity or enlargement. Normal ROM.  Neck Supple.  Skin No rashes, lesions or ulceration. Normal palpated skin turgor. No nodularity.  Lungs: Clear to auscultation.No rales or wheezes. Normal Respiratory effort, no retractions.  Heart: NSR.   No murmurs or rubs appreciated. No periferal edema  Abdomen: Soft.  Non-tender.  No masses.  No HSM. No hernia  Extremities: Moves all appropriately.  Normal ROM for age. No lymphadenopathy.  Neuro: Grossly intact  Psych: Oriented to PPT.  Normal mood. Normal affect.     Pelvic:   Vulva: Normal appearance.  No lesions. Some thinning of tissues but less evidence for LS.  Vagina: No lesions or abnormalities noted. ATROPHY noted.  Support: Normal pelvic support.  Urethra No masses tenderness or scarring.  Meatus Normal size without lesions or prolapse.  Cervix Surgically absent  Vag Cuff: Intact.  No lesions.  Anus: Normal exam.  No lesions.  Perineum: Normal exam.  No lesions.        Bimanual   Uterus: Surgically Absent  Adnexae: No masses.  Non-tender to palpation.  Cuff: Negative for abnormality.   Assessment:  Problem List Items Addressed This Visit      Musculoskeletal and Integument   Lichen sclerosus     Genitourinary   Vaginal atrophy - Primary     Plan: She will undergo continue CLOBETASOL PRN for ext sx's and begin INTRAROSA for internal sx's of dryness and dyspareunia in her medical therapy. Rx- Intrarosa, info gv, [place] discussed. She was amenable to this plan and we will see her back for annual/PRN. Moderate complexity Barnett Applebaum, MD, [facility] 03/13/2017  9:26 AM

## 2017-03-13 NOTE — Patient Instructions (Signed)
Prasterone vaginal insert What is this medicine? PRASTERONE (PRAS ter one), also known as DEHYDROEPIANDROSTERONE (DHEA) is used to treat females who experience painful sexual intercourse, a symptom of menopause that occurs due to changes in and around the vagina. This medicine may be used for other purposes; ask your health care provider or pharmacist if you have questions. COMMON BRAND NAME(S): INTRAROSA What should I tell my health care provider before I take this medicine? They need to know if you have any of these conditions: -cancer, such as breast, uterine, or other cancer -history of vaginal bleeding -an unusual or allergic reaction to prasterone, DHEA, other hormones, medicines, foods, dyes, or preservatives -pregnant or trying to get pregnant -breast-feeding How should I use this medicine? This medicine is for vaginal use only. Do not take by mouth. Follow the directions on the prescription label. Read package directions carefully before using. Wash hands before and after use. Use this medicine at bedtime. Do not use it more often than directed. Do not stop using except on your doctor's advice. Talk to your pediatrician regarding the use of this medicine in children. This medicine is not approved for use in children. Overdosage: If you think you have taken too much of this medicine contact a poison control center or emergency room at once. NOTE: This medicine is only for you. Do not share this medicine with others. What if I miss a dose? If you miss a dose, use it as soon as you can. If it is almost time for your next dose, use only that dose. Do not use double or extra doses. What may interact with this medicine? Interactions are not expected. Do not use any other vaginal products without telling your doctor or health care professional. This list may not describe all possible interactions. Give your health care provider a list of all the medicines, herbs, non-prescription drugs, or  dietary supplements you use. Also tell them if you smoke, drink alcohol, or use illegal drugs. Some items may interact with your medicine. What should I watch for while using this medicine? Visit your doctor or health care professional for a regular check on your progress. This medicine may cause changes on a cervical Pap smear. You will receive regular pelvic exams. What side effects may I notice from receiving this medicine? Side effects that you should report to your doctor or health care professional as soon as possible: -allergic reactions like skin rash, itching or hives Side effects that usually do not require medical attention (report to your doctor or health care professional if they continue or are bothersome): -vaginal discharge This list may not describe all possible side effects. Call your doctor for medical advice about side effects. You may report side effects to FDA at 1-800-FDA-1088. Where should I keep my medicine? Keep out of the reach of children. Store at room temperature or in a refrigerator between 5 and 30 degrees C (41 and 86 degrees F). Throw away any unused medicine after the expiration date. NOTE: This sheet is a summary. It may not cover all possible information. If you have questions about this medicine, talk to your doctor, pharmacist, or health care provider.  2018 Elsevier/Gold Standard (2015-11-30 12:30:18)  

## 2017-03-25 ENCOUNTER — Other Ambulatory Visit: Payer: Self-pay | Admitting: Family Medicine

## 2017-04-14 ENCOUNTER — Other Ambulatory Visit: Payer: Self-pay | Admitting: Family Medicine

## 2017-04-19 ENCOUNTER — Telehealth: Payer: Self-pay | Admitting: Family Medicine

## 2017-04-19 NOTE — Telephone Encounter (Signed)
Copied from [place] [id number]. Topic: Quick Communication - See Telephone Encounter >> Apr 19, 2017 12:46 PM Cleaster Corin, NT wrote: CRM for notification. See Telephone encounter for:  04/19/17. Pt. Would like a refill on (zoloft)

## 2017-04-19 NOTE — Telephone Encounter (Signed)
Left message for Brandi Mahoney that a refill for #15 tablets was sent to [facility] on [address] in [place] on 04/14/2017.  She is passed due for her CPE with Dr. Lorelei Pont and will need to schedule her physical prior to any additional refills. I ask that she call the office to schedule that appointment.

## 2017-04-20 ENCOUNTER — Other Ambulatory Visit: Payer: Self-pay | Admitting: Family Medicine

## 2017-04-20 DIAGNOSIS — Z23 Encounter for immunization: Secondary | ICD-10-CM | POA: Diagnosis not present

## 2017-05-05 ENCOUNTER — Other Ambulatory Visit: Payer: Self-pay | Admitting: Family Medicine

## 2017-05-06 ENCOUNTER — Other Ambulatory Visit: Payer: Self-pay | Admitting: Family Medicine

## 2017-05-07 ENCOUNTER — Telehealth: Payer: Self-pay | Admitting: *Deleted

## 2017-05-07 MED ORDER — FENOFIBRATE 160 MG PO TABS: 160.0000 mg | ORAL_TABLET | Freq: Every day | ORAL | 0 refills | Status: DC

## 2017-05-07 NOTE — Telephone Encounter (Signed)
Received fax from [facility] request PA for fenofibrate 145 mg or prescribe alternate fenofibrate 54 mg or 160 mg.  Please advise.  Start PA or send in new rx for fenofibrate 160 mg?

## 2017-05-07 NOTE — Telephone Encounter (Signed)
160 mg is fine

## 2017-05-07 NOTE — Telephone Encounter (Signed)
Rx for Fenofibrate 160 mg sent to [facility] as instructed by Dr. Lorelei Pont.

## 2017-05-16 ENCOUNTER — Other Ambulatory Visit: Payer: Self-pay | Admitting: Family Medicine

## 2017-05-17 ENCOUNTER — Encounter: Payer: 59 | Admitting: Family Medicine

## 2017-05-23 ENCOUNTER — Ambulatory Visit (INDEPENDENT_AMBULATORY_CARE_PROVIDER_SITE_OTHER): Payer: 59 | Admitting: Family Medicine

## 2017-05-23 ENCOUNTER — Encounter: Payer: Self-pay | Admitting: Family Medicine

## 2017-05-23 ENCOUNTER — Other Ambulatory Visit: Payer: Self-pay

## 2017-05-23 VITALS — BP 140/76 | HR 73 | Temp 98.1°F | Ht 63.5 in | Wt 172.8 lb

## 2017-05-23 DIAGNOSIS — E782 Mixed hyperlipidemia: Secondary | ICD-10-CM | POA: Diagnosis not present

## 2017-05-23 DIAGNOSIS — E038 Other specified hypothyroidism: Secondary | ICD-10-CM

## 2017-05-23 DIAGNOSIS — G4733 Obstructive sleep apnea (adult) (pediatric): Secondary | ICD-10-CM | POA: Diagnosis not present

## 2017-05-23 DIAGNOSIS — Z0000 Encounter for general adult medical examination without abnormal findings: Secondary | ICD-10-CM

## 2017-05-23 DIAGNOSIS — Z1211 Encounter for screening for malignant neoplasm of colon: Secondary | ICD-10-CM

## 2017-05-23 LAB — BASIC METABOLIC PANEL
BUN: 14 mg/dL (ref 6–23)
CHLORIDE: 103 meq/L (ref 96–112)
CO2: 30 meq/L (ref 19–32)
Calcium: 9.4 mg/dL (ref 8.4–10.5)
Creatinine, Ser: 0.72 mg/dL (ref 0.40–1.20)
GFR: 87.53 mL/min (ref >60.00)
GLUCOSE: 110 mg/dL — AB (ref 70–99)
POTASSIUM: 4.2 meq/L (ref 3.5–5.1)
Sodium: 141 mEq/L (ref 135–145)

## 2017-05-23 LAB — CBC WITH DIFFERENTIAL/PLATELET
BASOS PCT: 0.4 % (ref 0.0–3.0)
Basophils Absolute: 0 10*3/uL (ref 0.0–0.1)
EOS PCT: 2.7 % (ref 0.0–5.0)
Eosinophils Absolute: 0.2 10*3/uL (ref 0.0–0.7)
HCT: 40.9 % (ref 36.0–46.0)
HEMOGLOBIN: 13.3 g/dL (ref 12.0–15.0)
LYMPHS ABS: 2.2 10*3/uL (ref 0.7–4.0)
Lymphocytes Relative: 31.2 % (ref 12.0–46.0)
MCHC: 32.6 g/dL (ref 30.0–36.0)
MCV: 81.9 fl (ref 78.0–100.0)
MONO ABS: 0.5 10*3/uL (ref 0.1–1.0)
Monocytes Relative: 7.2 % (ref 3.0–12.0)
NEUTROS ABS: 4.2 10*3/uL (ref 1.4–7.7)
Neutrophils Relative %: 58.5 % (ref 43.0–77.0)
Platelets: 272 10*3/uL (ref 150.0–400.0)
RBC: 4.99 Mil/uL (ref 3.87–5.11)
RDW: 13.9 % (ref 11.5–15.5)
WBC: 7.2 10*3/uL (ref 4.0–10.5)

## 2017-05-23 LAB — T3, FREE: T3, Free: 3.7 pg/mL (ref 2.3–4.2)

## 2017-05-23 LAB — LIPID PANEL
CHOL/HDL RATIO: 3
CHOLESTEROL: 144 mg/dL (ref 0–200)
HDL: 42.8 mg/dL (ref >39.00)
NonHDL: 101.29
TRIGLYCERIDES: 213 mg/dL — AB (ref 0.0–149.0)
VLDL: 42.6 mg/dL — ABNORMAL HIGH (ref 0.0–40.0)

## 2017-05-23 LAB — LDL CHOLESTEROL, DIRECT: LDL DIRECT: 78 mg/dL

## 2017-05-23 LAB — HEPATIC FUNCTION PANEL
ALT: 39 U/L — AB (ref 0–35)
AST: 36 U/L (ref 0–37)
Albumin: 5 g/dL (ref 3.5–5.2)
Alkaline Phosphatase: 60 U/L (ref 39–117)
BILIRUBIN TOTAL: 0.7 mg/dL (ref 0.2–1.2)
Bilirubin, Direct: 0.1 mg/dL (ref 0.0–0.3)
Total Protein: 7.1 g/dL (ref 6.0–8.3)

## 2017-05-23 LAB — T4, FREE: Free T4: 0.74 ng/dL (ref 0.60–1.60)

## 2017-05-23 LAB — TSH: TSH: 2.87 u[IU]/mL (ref 0.35–4.50)

## 2017-05-23 MED ORDER — SERTRALINE HCL 50 MG PO TABS: 50.0000 mg | ORAL_TABLET | Freq: Every day | ORAL | 3 refills | Status: DC

## 2017-05-23 MED ORDER — FENOFIBRATE 160 MG PO TABS: 160.0000 mg | ORAL_TABLET | Freq: Every day | ORAL | 0 refills | Status: DC

## 2017-05-23 MED ORDER — PRAVASTATIN SODIUM 40 MG PO TABS: 40.0000 mg | ORAL_TABLET | Freq: Every day | ORAL | 3 refills | Status: DC

## 2017-05-23 MED ORDER — PANTOPRAZOLE SODIUM 40 MG PO TBEC: 40.0000 mg | DELAYED_RELEASE_TABLET | Freq: Every day | ORAL | 3 refills | Status: DC

## 2017-05-23 MED ORDER — GABAPENTIN 300 MG PO CAPS: 300.0000 mg | ORAL_CAPSULE | Freq: Every day | ORAL | 3 refills | Status: DC

## 2017-05-23 MED ORDER — LEVOTHYROXINE SODIUM 75 MCG PO TABS: 75.0000 ug | ORAL_TABLET | Freq: Every day | ORAL | 3 refills | Status: DC

## 2017-05-23 NOTE — Patient Instructions (Signed)
REFERRALS TO SPECIALISTS, SPECIAL TESTS (MRI, CT, ULTRASOUNDS)  [facility] or [facility] will help you. ASK CHECK-IN FOR HELP.  Imaging / Special Testing referrals sometimes can be done same day if EMERGENCY, but others can take 2 or 3 days to get an appointment. Starting in 2015, many of the new Medicare plans and Obamacare plans take much longer.   Specialist appointment times vary a great deal, based on their schedule / openings. -- Some specialists have very long wait times. (Example. Dermatology. Multiple months  for non-cancer)   

## 2017-05-23 NOTE — Progress Notes (Signed)
Dr. Frederico Hamman T. Tazaria Dlugosz, MD, [facility] [place], [address] Phone: [phone] Fax: [phone]  05/23/2017  Patient: Brandi Mahoney, MRN: [id number], DOB: 1956/06/19, 61 y.o.  Primary Physician:  Owens Loffler, MD   Chief Complaint  Patient presents with  . Annual Exam   Subjective:   Brandi Mahoney is a 61 y.o. pleasant patient who presents with the following:  Health Maintenance Summary Reviewed and updated, unless pt declines services.  Tobacco History Reviewed. Non-smoker Alcohol: No concerns, no excessive use Exercise Habits: Some activity, rec at least 30 mins 5 times a week STD concerns: none Drug Use: None Birth control method: hyst Menses regular: n/a Lumps or breast concerns: no Breast Cancer Family History: sister  Health Maintenance  Topic Date Due  . COLONOSCOPY  11/14/2016  . MAMMOGRAM  10/14/2017  . TETANUS/TDAP  05/25/2019  . INFLUENZA VACCINE  Completed  . Hepatitis C Screening  Completed  . HIV Screening  Completed    Immunization History  Administered Date(s) Administered  . Influenza Split 03/19/2013  . Influenza,inj,Quad PF,6+ Mos 04/20/2017  . Influenza-Unspecified 03/20/2015  . Pneumococcal Polysaccharide-23 03/02/2016  . Td 05/24/2009   Patient Active Problem List   Diagnosis Date Noted  . Mixed hyperlipidemia 04/01/2010    Priority: High  . HYPERTRIGLYCERIDEMIA 07/21/2009    Priority: High  . Hypothyroidism 09/08/2008    Priority: High  . Major depressive disorder, recurrent episode, in full remission ([place]) 09/08/2008    Priority: Medium  . Lichen sclerosus 37/62/8315  . Vaginal atrophy 09/26/2016  . Coronary artery disease involving native coronary artery of native heart with angina pectoris ([place])   . Borderline diabetes   . Anxiety   . Renal stone   . Obstructive sleep apnea 09/25/2013  . 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 IBS (irritable bowel  syndrome) 09/26/2012  . PARESTHESIA, HANDS 06/01/2010  . 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 ASTHMA 09/08/2008   Past Medical History:  Diagnosis Date  . Asthma   . CAD (coronary artery disease)    a. 01/2010 Cath: mild atherosclerosis in the mid LCX after takeoff of OM1, mild diff LAD dzs;  b. 07/2013 MV: EF 67%, no ischemia.  . Depression   . Hemorrhoids   . History of pneumonia 1969  . HLD (hyperlipidemia)   . Hypothyroidism   . IBS (irritable bowel syndrome)   . Migraine headache   . Myocarditis ([place])    a. 01/2010 presented w/ c/p and elev trop-->Cath: mild atherosclerosis in the mid LCX after takeoff of OM1, mild diff LAD dzs;  b. 03/2010 Echo: EF 60-65%, DD, mild LVH;  c. 07/2011 Echo: EF 55-60%, mild LVH, no rwma.  . Prediabetes    Past Surgical History:  Procedure Laterality Date  . APPENDECTOMY  1974  . BREAST BIOPSY Right 1988   neg  . CARDIAC CATHETERIZATION  8/11   no significant -[facility]- Dr Rockey Situ,  . CHOLECYSTECTOMY  2002  . COLONOSCOPY  2013  . CYST EXCISION    . NEPHROLITHOTOMY Right 08/20/2014   Procedure: RIGHT PERCUTANEOUS NEPHROLITHOTOMY ;  Surgeon: Malka So, MD;  Location: [facility];  Service: Urology;  Laterality: Right;  . TOTAL ABDOMINAL HYSTERECTOMY W/ BILATERAL SALPINGOOPHORECTOMY  2000   no ovaries, uterus, or cervix.   Social History   Socioeconomic History  . Marital status: Married    Spouse name: Nicole Kindred  . Number of children: 3  . Years  of education: Not on file  . Highest education level: Not on file  Social Needs  . Financial resource strain: Not on file  . Food insecurity - worry: Not on file  . Food insecurity - inability: Not on file  . Transportation needs - medical: Not on file  . Transportation needs - non-medical: Not on file  Occupational History  . 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   Tobacco Use  . Smoking status: Former Smoker    Packs/day: 0.35     Years: 35.00    Pack years: 12.25    Types: Cigarettes    Last attempt to quit: 09/25/2008    Years since quitting: 8.6  . Smokeless tobacco: Never Used  . Tobacco comment: quit in 2007-2008, social smoker  Substance and Sexual Activity  . Alcohol use: Yes    Comment: rarely  . Drug use: No  . Sexual activity: Not on file  Other Topics Concern  . Not on file  Social History Narrative   Lives in [place] w/ husband.  No reg exercise.   Family History  Problem Relation Age of Onset  . Coronary artery disease Mother   . Diabetes Mother   . ALS Mother   . Hyperlipidemia Father   . Breast cancer Paternal Grandmother   . Lung cancer Paternal Grandfather        CAD  . Other Daughter        gluten  . Down syndrome Son   . Colon cancer Maternal Grandfather   . Cancer Sister 45       breast  . Breast cancer Sister 82   Allergies  Allergen Reactions  . Pseudoeph-Doxylamine-Dm-Apap Anaphylaxis    Allergy to nyquil  . Gluten Other (See Comments)    Gi upset  . Penicillins Itching and Rash    Medication list has been reviewed and updated.   General: Denies fever, chills, sweats. No significant weight loss. Eyes: Denies blurring,significant itching ENT: Denies earache, sore throat, and hoarseness.  Cardiovascular: Denies chest pains, palpitations, dyspnea on exertion,  Respiratory: Denies cough, dyspnea at rest,wheeezing Breast: no concerns about lumps GI: Denies nausea, vomiting, diarrhea, constipation, change in bowel habits, abdominal pain, melena, hematochezia GU: Denies dysuria, hematuria, urinary hesitancy, nocturia, denies STD risk, no concerns about discharge Musculoskeletal: Denies back pain, joint pain Derm: Denies rash, itching Neuro: Denies  paresthesias, frequent falls, frequent headaches Psych: Denies depression, anxiety Endocrine: Denies cold intolerance, heat intolerance, polydipsia Heme: Denies enlarged lymph nodes Allergy: No hayfever  Objective:    BP 140/76   Pulse 73   Temp 98.1 F (36.7 C) (Oral)   Ht 5' 3.5" (1.613 m)   Wt 172 lb 12 oz (78.4 kg)   BMI 30.12 kg/m  No exam data present  GEN: well developed, well nourished, no acute distress Eyes: conjunctiva and lids normal, PERRLA, EOMI ENT: TM clear, nares clear, oral exam WNL Neck: supple, no lymphadenopathy, no thyromegaly, no JVD Pulm: clear to auscultation and percussion, respiratory effort normal CV: regular rate and rhythm, S1-S2, no murmur, rub or gallop, no bruits Chest: no scars, masses, no lumps BREAST: breast exam declined GI: soft, non-tender; no hepatosplenomegaly, masses; active bowel sounds all quadrants GU: GU exam declined Lymph: no cervical, axillary or inguinal adenopathy MSK: gait normal, muscle tone and strength WNL, no joint swelling, effusions, discoloration, crepitus  SKIN: clear, good turgor, color WNL, no rashes, lesions, or  ulcerations Neuro: normal mental status, normal strength, sensation, and motion Psych: alert; oriented to person, place and time, normally interactive and not anxious or depressed in appearance.   All labs reviewed with patient. Lipids:    Component Value Date/Time   CHOL 144 05/23/2017 0929   CHOL 151 10/17/2011 0826   TRIG 213.0 (H) 05/23/2017 0929   TRIG 136 10/17/2011 0826   HDL 42.80 05/23/2017 0929   HDL 44 10/17/2011 0826   LDLDIRECT 78.0 05/23/2017 0929   VLDL 42.6 (H) 05/23/2017 0929   VLDL 27 10/17/2011 0826   CHOLHDL 3 05/23/2017 0929   CBC: CBC Latest Ref Rng & Units 05/23/2017 09/08/2016 06/23/2016  WBC 4.0 - 10.5 K/uL 7.2 6.9 8.0  Hemoglobin 12.0 - 15.0 g/dL 13.3 13.2 11.9(L)  Hematocrit 36.0 - 46.0 % 40.9 40.6 35.6  Platelets 150.0 - 400.0 K/uL 272.0 265.0 093    Basic Metabolic Panel:    Component Value Date/Time   NA 141 05/23/2017 0929   NA 139 08/05/2013 1739   K 4.2 05/23/2017 0929   K 3.7 08/05/2013 1739   CL 103 05/23/2017 0929   CL 108 (H) 08/05/2013 1739   CO2 30 05/23/2017 0929    CO2 24 08/05/2013 1739   BUN 14 05/23/2017 0929   BUN 15 08/05/2013 1739   CREATININE 0.72 05/23/2017 0929   CREATININE 0.63 08/05/2013 1739   GLUCOSE 110 (H) 05/23/2017 0929   GLUCOSE 108 (H) 08/05/2013 1739   CALCIUM 9.4 05/23/2017 0929   CALCIUM 8.6 08/05/2013 1739   Hepatic Function Latest Ref Rng & Units 05/23/2017 09/08/2016 03/01/2016  Total Protein 6.0 - 8.3 g/dL 7.1 7.3 6.8  Albumin 3.5 - 5.2 g/dL 5.0 4.6 4.3  AST 0 - 37 U/L 36 36 40(H)  ALT 0 - 35 U/L 39(H) 45(H) 50(H)  Alk Phosphatase 39 - 117 U/L 60 61 74  Total Bilirubin 0.2 - 1.2 mg/dL 0.7 0.4 0.4  Bilirubin, Direct 0.0 - 0.3 mg/dL 0.1 - 0.1    Lab Results  Component Value Date   TSH 2.87 05/23/2017   No results found.  Assessment and Plan:   Healthcare maintenance - Plan: Basic metabolic panel, CBC with Differential/Platelet, Hepatic function panel  Screen for colon cancer - Plan: Ambulatory referral to Gastroenterology  Obstructive sleep apnea - Plan: gabapentin (NEURONTIN) 300 MG capsule  Other specified hypothyroidism - Plan: T4, free, T3, free, TSH  Mixed hyperlipidemia - Plan: Lipid panel  Health Maintenance Exam: The patient's preventative maintenance and recommended screening tests for an annual wellness exam were reviewed in full today. Brought up to date unless services declined.  Counselled on the importance of diet, exercise, and its role in overall health and mortality. The patient's FH and SH was reviewed, including their home life, tobacco status, and drug and alcohol status.  Follow-up in 1 year for physical exam or additional follow-up below.  Follow-up: No Follow-up on file. Or follow-up in 1 year if not noted.  Meds ordered this encounter  Medications  . fenofibrate 160 MG tablet    Sig: Take 1 tablet (160 mg total) by mouth daily.    Dispense:  30 tablet    Refill:  0  . gabapentin (NEURONTIN) 300 MG capsule    Sig: Take 1 capsule (300 mg total) by mouth at bedtime.     Dispense:  90 capsule    Refill:  3  . levothyroxine (SYNTHROID, LEVOTHROID) 75 MCG tablet    Sig: Take 1 tablet (75 mcg  total) by mouth daily with breakfast.    Dispense:  90 tablet    Refill:  3  . pantoprazole (PROTONIX) 40 MG tablet    Sig: Take 1 tablet (40 mg total) by mouth daily.    Dispense:  90 tablet    Refill:  3  . pravastatin (PRAVACHOL) 40 MG tablet    Sig: Take 1 tablet (40 mg total) by mouth at bedtime.    Dispense:  90 tablet    Refill:  3  . sertraline (ZOLOFT) 50 MG tablet    Sig: Take 1 tablet (50 mg total) by mouth daily.    Dispense:  90 tablet    Refill:  3   Medications Discontinued During This Encounter  Medication Reason  . estradiol (ESTRACE VAGINAL) 0.1 MG/GM vaginal cream Change in therapy  . clobetasol cream (TEMOVATE) 0.05 % Change in therapy  . fenofibrate 160 MG tablet Reorder  . gabapentin (NEURONTIN) 300 MG capsule Reorder  . levothyroxine (SYNTHROID, LEVOTHROID) 75 MCG tablet Reorder  . pantoprazole (PROTONIX) 40 MG tablet Reorder  . pravastatin (PRAVACHOL) 40 MG tablet Reorder  . sertraline (ZOLOFT) 50 MG tablet Reorder   Orders Placed This Encounter  Procedures  . Basic metabolic panel  . CBC with Differential/Platelet  . Hepatic function panel  . Lipid panel  . T4, free  . T3, free  . TSH  . LDL cholesterol, direct  . Ambulatory referral to Gastroenterology    Signed,  Frederico Hamman T. Nayelis Bonito, MD   Allergies as of 05/23/2017      Reactions   Pseudoeph-doxylamine-dm-apap Anaphylaxis   Allergy to nyquil   Gluten Other (See Comments)   Gi upset   Penicillins Itching, Rash      Medication List        Accurate as of 05/23/17  1:46 PM. Always use your most recent med list.          aspirin EC 81 MG tablet Take 81 mg by mouth daily.   CENTRUM SILVER tablet Take 1 tablet by mouth daily.   cyclobenzaprine 10 MG tablet Commonly known as:  FLEXERIL TAKE 1 TABLET (10 MG TOTAL) BY MOUTH 2 (TWO) TIMES DAILY AS NEEDED  (PAIN).   fenofibrate 160 MG tablet Take 1 tablet (160 mg total) by mouth daily.   gabapentin 300 MG capsule Commonly known as:  NEURONTIN Take 1 capsule (300 mg total) by mouth at bedtime.   glucose blood test strip Commonly known as:  ACCU-CHEK AVIVA Use to check blood sugar once daily   levothyroxine 75 MCG tablet Commonly known as:  SYNTHROID, LEVOTHROID Take 1 tablet (75 mcg total) by mouth daily with breakfast.   magnesium gluconate 500 MG tablet Commonly known as:  MAGONATE Take 500 mg by mouth daily.   nitroGLYCERIN 0.4 MG SL tablet Commonly known as:  NITROSTAT 1 tablet under tongue at onset of chest pain; you may repeat every 5 minutes for up to 3 doses.   pantoprazole 40 MG tablet Commonly known as:  PROTONIX Take 1 tablet (40 mg total) by mouth daily.   Prasterone 6.5 MG Inst Commonly known as:  INTRAROSA Place 6.5 mg vaginally at bedtime.   pravastatin 40 MG tablet Commonly known as:  PRAVACHOL Take 1 tablet (40 mg total) by mouth at bedtime.   sertraline 50 MG tablet Commonly known as:  ZOLOFT Take 1 tablet (50 mg total) by mouth daily.

## 2017-06-25 ENCOUNTER — Encounter: Payer: Self-pay | Admitting: Family Medicine

## 2017-07-19 DIAGNOSIS — L84 Corns and callosities: Secondary | ICD-10-CM | POA: Diagnosis not present

## 2017-07-24 ENCOUNTER — Ambulatory Visit: Payer: Self-pay | Admitting: *Deleted

## 2017-07-24 ENCOUNTER — Encounter: Payer: Self-pay | Admitting: Family Medicine

## 2017-07-24 ENCOUNTER — Ambulatory Visit: Payer: 59 | Admitting: Family Medicine

## 2017-07-24 DIAGNOSIS — R6889 Other general symptoms and signs: Secondary | ICD-10-CM | POA: Diagnosis not present

## 2017-07-24 MED ORDER — DICYCLOMINE HCL 10 MG PO CAPS: 10.0000 mg | ORAL_CAPSULE | Freq: Three times a day (TID) | ORAL | 0 refills | Status: DC

## 2017-07-24 MED ORDER — OSELTAMIVIR PHOSPHATE 75 MG PO CAPS: 75.0000 mg | ORAL_CAPSULE | Freq: Two times a day (BID) | ORAL | 0 refills | Status: DC

## 2017-07-24 MED ORDER — ONDANSETRON 4 MG PO TBDP: 4.0000 mg | ORAL_TABLET | Freq: Three times a day (TID) | ORAL | 0 refills | Status: DC | PRN

## 2017-07-24 NOTE — Patient Instructions (Addendum)
Please try to stay well hydrated Please try to eat chicken broth or Gatorade Please follow up if your symptoms don't seem to be improving after 7-10 days.

## 2017-07-24 NOTE — Progress Notes (Deleted)
Brandi Mahoney - 62 y.o. female MRN [id number]  Date of birth: 09/27/1955  SUBJECTIVE:  Including CC & ROS.  Chief Complaint  Patient presents with  . Diarrhea    Brandi Mahoney is a 62 y.o. female that is presenting with diarrhea and vomiting. Ongoing since yesterday. Admits to fevers and body aches. Temperatures have been 101. She has been taking Advil. She is unable to keep food down, able to tolerate liquids. She is constantly nauseous.   Review of Systems  HISTORY: Past Medical, Surgical, Social, and Family History Reviewed & Updated per EMR.   Pertinent Historical Findings include:  Past Medical History:  Diagnosis Date  . Asthma   . CAD (coronary artery disease)    a. 01/2010 Cath: mild atherosclerosis in the mid LCX after takeoff of OM1, mild diff LAD dzs;  b. 07/2013 MV: EF 67%, no ischemia.  . Depression   . Hemorrhoids   . History of pneumonia 1969  . HLD (hyperlipidemia)   . Hypothyroidism   . IBS (irritable bowel syndrome)   . Migraine headache   . Myocarditis ([place])    a. 01/2010 presented w/ c/p and elev trop-->Cath: mild atherosclerosis in the mid LCX after takeoff of OM1, mild diff LAD dzs;  b. 03/2010 Echo: EF 60-65%, DD, mild LVH;  c. 07/2011 Echo: EF 55-60%, mild LVH, no rwma.  . Prediabetes     Past Surgical History:  Procedure Laterality Date  . APPENDECTOMY  1974  . BREAST BIOPSY Right 1988   neg  . CARDIAC CATHETERIZATION  8/11   no significant -[facility]- Dr Rockey Situ,  . CHOLECYSTECTOMY  2002  . COLONOSCOPY  2013  . CYST EXCISION    . NEPHROLITHOTOMY Right 08/20/2014   Procedure: RIGHT PERCUTANEOUS NEPHROLITHOTOMY ;  Surgeon: Malka So, MD;  Location: [facility];  Service: Urology;  Laterality: Right;  . TOTAL ABDOMINAL HYSTERECTOMY W/ BILATERAL SALPINGOOPHORECTOMY  2000   no ovaries, uterus, or cervix.    Allergies  Allergen Reactions  . Pseudoeph-Doxylamine-Dm-Apap Anaphylaxis    Allergy to nyquil  . Gluten Other (See Comments)    Gi upset  .  Penicillins Itching and Rash    Family History  Problem Relation Age of Onset  . Coronary artery disease Mother   . Diabetes Mother   . ALS Mother   . Hyperlipidemia Father   . Breast cancer Paternal Grandmother   . Lung cancer Paternal Grandfather        CAD  . Other Daughter        gluten  . Down syndrome Son   . Colon cancer Maternal Grandfather   . Cancer Sister 23       breast  . Breast cancer Sister 25     Social History   Socioeconomic History  . Marital status: Married    Spouse name: Nicole Kindred  . Number of children: 3  . Years of education: Not on file  . Highest education level: Not on file  Social Needs  . Financial resource strain: Not on file  . Food insecurity - worry: Not on file  . Food insecurity - inability: Not on file  . Transportation needs - medical: Not on file  . Transportation needs - non-medical: Not on file  Occupational History  . 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   Tobacco Use  . Smoking status: Former Smoker    Packs/day: 0.35  Years: 35.00    Pack years: 12.25    Types: Cigarettes    Last attempt to quit: 09/25/2008    Years since quitting: 8.8  . Smokeless tobacco: Never Used  . Tobacco comment: quit in 2007-2008, social smoker  Substance and Sexual Activity  . Alcohol use: Yes    Comment: rarely  . Drug use: No  . Sexual activity: Not on file  Other Topics Concern  . Not on file  Social History Narrative   Lives in [place] w/ husband.  No reg exercise.     PHYSICAL EXAM:  VS: BP 134/76 (BP Location: Left Arm, Patient Position: Sitting, Cuff Size: Normal)   Pulse (!) 106   Temp (!) 101.5 F (38.6 C) (Oral)   Wt 172 lb (78 kg)   SpO2 98%   BMI 29.99 kg/m  Physical Exam Gen: NAD, alert, cooperative with exam, well-appearing ENT: normal lips, normal nasal mucosa,  Eye: normal EOM, normal conjunctiva and lids CV:  no edema, +2 pedal pulses   Resp:  no accessory muscle use, non-labored,  GI: no masses or tenderness, no hernia  Skin: no rashes, no areas of induration  Neuro: normal tone, normal sensation to touch Psych:  normal insight, alert and oriented MSK:  ***      ASSESSMENT & PLAN:   No problem-specific Assessment & Plan notes found for this encounter.

## 2017-07-24 NOTE — Assessment & Plan Note (Signed)
Symptoms are likely viral in nature. Started yesterday  - zofran for nausea  - bentyl for abdominal cramps  - counseled on supportive care  - tamiflu  - given indications to f/u

## 2017-07-24 NOTE — Telephone Encounter (Signed)
Pt called complaining of "vomiting, diarrhea,  her heart is racing, fever between 102.0 and 103.0, weakness, and her heart is racing; she says that yesterday she was vomiting and having diarrhea at the same time yesterday; she has had tylenol for her fever; per nurse triage recommendation made for pt to see physician within 4 hours but pt would like to see Dr Lorelei [facility] at [facility] today rather than going to ED or urgent care; no appointments available at [facility] or [facility]; pt offered and accepted appointment with Dr Clearance Coots at [facility] today at 1420; pt verbalizes understanding.  Reason for Disposition . [1] Fever > 101 F (38.3 C) AND [2] age > 28  Answer Assessment - Initial Assessment Questions 1. VOMITING SEVERITY: "How many times have you vomited in the past 24 hours?"     - MILD:  1 - 2 times/day    - MODERATE: 3 - 5 times/day, decreased oral intake without significant weight loss or symptoms of dehydration    - SEVERE: 6 or more times/day, vomits everything or nearly everything, with significant weight loss, symptoms of dehydration      3 2. ONSET: "When did the vomiting begin?"      07/23/17 3. FLUIDS: "What fluids or food have you vomited up today?" "Have you been able to keep any fluids down?"    Sips of water and sprite  4. ABDOMINAL PAIN: "Are your having any abdominal pain?" If yes : "How bad is it and what does it feel like?" (e.g., crampy, dull, intermittent, constant)      crampy 5. DIARRHEA: "Is there any diarrhea?" If so, ask: "How many times today?"      3 6. CONTACTS: "Is there anyone else in the family with the same symptoms?"      no 7. CAUSE: "What do you think is causing your vomiting?"     ? stomach bug 8. HYDRATION STATUS: "Any signs of dehydration?" (e.g., dry mouth [not only dry lips], too weak to stand) "When did you last urinate?"    Weakness but can stand independently 9. OTHER SYMPTOMS: "Do you have any other symptoms?" (e.g., fever,  headache, vertigo, vomiting blood or coffee grounds, recent head injury)     Headache, temperature 102.0 this morning at 920 with mercury thermometer 10. PREGNANCY: "Is there any chance you are pregnant?" "When was your last menstrual period?"       no  Protocols used: [facility]

## 2017-07-24 NOTE — Progress Notes (Signed)
Brandi Mahoney - 62 y.o. female MRN [id number]  Date of birth: 02-23-1956  SUBJECTIVE:  Including CC & ROS.  Chief Complaint  Patient presents with  . Diarrhea    Brandi Mahoney is a 62 y.o. female that is presenting with diarrhea and vomiting. Ongoing since yesterday. Admits to fevers and body aches. Temperatures have been 101. She has been taking Advil. She is unable to keep food down, able to tolerate liquids. She is constantly nauseous. hasn't been around anyone with similar symptoms. Feels like symptoms are getting worse.    Review of Systems  Constitutional: Positive for chills, fatigue and fever.  HENT: Positive for rhinorrhea.   Respiratory: Negative for shortness of breath.   Cardiovascular: Negative for chest pain.  Gastrointestinal: Negative for abdominal pain.  Musculoskeletal: Positive for myalgias.  Skin: Negative for color change.  Neurological: Negative for numbness.  Hematological: Negative for adenopathy.  Psychiatric/Behavioral: Negative for agitation.    HISTORY: Past Medical, Surgical, Social, and Family History Reviewed & Updated per EMR.   Pertinent Historical Findings include:  Past Medical History:  Diagnosis Date  . Asthma   . CAD (coronary artery disease)    a. 01/2010 Cath: mild atherosclerosis in the mid LCX after takeoff of OM1, mild diff LAD dzs;  b. 07/2013 MV: EF 67%, no ischemia.  . Depression   . Hemorrhoids   . History of pneumonia 1969  . HLD (hyperlipidemia)   . Hypothyroidism   . IBS (irritable bowel syndrome)   . Migraine headache   . Myocarditis ([place])    a. 01/2010 presented w/ c/p and elev trop-->Cath: mild atherosclerosis in the mid LCX after takeoff of OM1, mild diff LAD dzs;  b. 03/2010 Echo: EF 60-65%, DD, mild LVH;  c. 07/2011 Echo: EF 55-60%, mild LVH, no rwma.  . Prediabetes     Past Surgical History:  Procedure Laterality Date  . APPENDECTOMY  1974  . BREAST BIOPSY Right 1988   neg  . CARDIAC CATHETERIZATION  8/11   no significant -[facility]- Dr Rockey Situ,  . CHOLECYSTECTOMY  2002  . COLONOSCOPY  2013  . CYST EXCISION    . NEPHROLITHOTOMY Right 08/20/2014   Procedure: RIGHT PERCUTANEOUS NEPHROLITHOTOMY ;  Surgeon: Malka So, MD;  Location: [facility];  Service: Urology;  Laterality: Right;  . TOTAL ABDOMINAL HYSTERECTOMY W/ BILATERAL SALPINGOOPHORECTOMY  2000   no ovaries, uterus, or cervix.    Allergies  Allergen Reactions  . Pseudoeph-Doxylamine-Dm-Apap Anaphylaxis    Allergy to nyquil  . Gluten Other (See Comments)    Gi upset  . Penicillins Itching and Rash    Family History  Problem Relation Age of Onset  . Coronary artery disease Mother   . Diabetes Mother   . ALS Mother   . Hyperlipidemia Father   . Breast cancer Paternal Grandmother   . Lung cancer Paternal Grandfather        CAD  . Other Daughter        gluten  . Down syndrome Son   . Colon cancer Maternal Grandfather   . Cancer Sister 40       breast  . Breast cancer Sister 51     Social History   Socioeconomic History  . Marital status: Married    Spouse name: Nicole Kindred  . Number of children: 3  . Years of education: Not on file  . Highest education level: Not on file  Social Needs  . Financial resource strain: Not on file  .  Food insecurity - worry: Not on file  . Food insecurity - inability: Not on file  . Transportation needs - medical: Not on file  . Transportation needs - non-medical: Not on file  Occupational History  . 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   Tobacco Use  . Smoking status: Former Smoker    Packs/day: 0.35    Years: 35.00    Pack years: 12.25    Types: Cigarettes    Last attempt to quit: 09/25/2008    Years since quitting: 8.8  . Smokeless tobacco: Never Used  . Tobacco comment: quit in 2007-2008, social smoker  Substance and Sexual Activity  . Alcohol use: Yes    Comment: rarely  . Drug use: No  . Sexual activity: Not on file   Other Topics Concern  . Not on file  Social History Narrative   Lives in [place] w/ husband.  No reg exercise.     PHYSICAL EXAM:  VS: BP 134/76 (BP Location: Left Arm, Patient Position: Sitting, Cuff Size: Normal)   Pulse (!) 106   Temp (!) 101.5 F (38.6 C) (Oral)   Wt 172 lb (78 kg)   SpO2 98%   BMI 29.99 kg/m  Physical Exam Gen: NAD, alert, cooperative with exam,  ENT: normal lips, normal nasal mucosa, tympanic membranes clear and intact bilaterally, normal oropharynx, no cervical lymphadenopathy Eye: normal EOM, normal conjunctiva and lids CV:  no edema, +2 pedal pulses, regular rate and rhythm, S1-S2   Resp: no accessory muscle use, non-labored, clear to auscultation bilaterally, no crackles or wheezes Skin: no rashes, no areas of induration  Neuro: normal tone, normal sensation to touch Psych:  normal insight, alert and oriented MSK: Normal gait, normal strength        ASSESSMENT & PLAN:   Flu-like symptoms Symptoms are likely viral in nature. Started yesterday  - zofran for nausea  - bentyl for abdominal cramps  - counseled on supportive care  - tamiflu  - given indications to f/u

## 2017-08-01 ENCOUNTER — Other Ambulatory Visit: Payer: Self-pay | Admitting: Family Medicine

## 2017-09-19 ENCOUNTER — Telehealth: Payer: Self-pay

## 2017-09-19 ENCOUNTER — Other Ambulatory Visit: Payer: Self-pay | Admitting: Obstetrics & Gynecology

## 2017-09-19 DIAGNOSIS — Z1231 Encounter for screening mammogram for malignant neoplasm of breast: Secondary | ICD-10-CM

## 2017-09-19 NOTE — Telephone Encounter (Signed)
Pt called to clarify usage and directions for intrarosa. Pt wanted to know if she was supposed to use it daily or only as needed for her condition. Stated okay to leave msg.   Left msg for pt that per Dr. Kenton Kingfisher' note intrarosa was prescribed for daily use at bedtime for internal symptoms and the clobetasol was PRN for external symptoms. Advised pt to call with any additional questions or concerns.

## 2017-10-04 NOTE — Progress Notes (Signed)
Cardiology Office Note  Date:  10/05/2017   ID:  Brandi Mahoney, DOB Nov 21, 1955, MRN [id number]  PCP:  Owens Loffler, MD   Chief Complaint  Patient presents with  . other    12 month f/u c/o rapid heart beat, sob with exertion and left arm/jaw/shoulder pain. Meds reviewed verbally with pt.    HPI:  Brandi Mahoney is a very pleasant 62 year old woman with  hx of troponin elevation to 17,  cardiac catheterization August 2011 showing nonobstructive left circumflex disease, presentation felt to be secondary to a viral myocarditis with low normal systolic function by cath,  repeat admission to [facility] for chest discomfort October 2011,  felt to be noncardiac with echocardiogram normal systolic function   Admitted to hospital 06/23/2016 chest pain Who presents for shortness of breath, tachycardia  Reports that over the past several weeks to months she has had worsening Shortness of breath, poor energy Can't do anything stenous, bothered by tachycardia Diffuse pain, Shoulder, arm, "everything hurts" Onging, getting worse, Very symptomatic  Stress at [facility] care of son who has Down's syndrome Previously reported having shortness of breath on exertion, developing into chest tightness Previous hospitalizations for chest pain symptoms  Previous CT scan chest in the emergency room showing no PE No significant coronary calcification  Stress test as an inpatient showing no ischemia  EKG personally reviewed by myself on todays visit Shows sinus tachycardia rate 104 bpm no significant ST-T wave changes  Other past medical history reviewed On a prior office visit in 2015, she reported having chest pain in the center of her chest radiating to her left arm starting 2 days ago. General malaise felt queasy, lightheaded, had chest pain and left arm pain.   shortness of breath with exertion. She is worried about progression of her CAD. She went to the emergency room on the way  home from work. Cardiac enzymes were negative x2 EKG essentially normal.   Given her chest pain a stress test was performed February 2015.  showed no ischemia. She had good exercise tolerance though on a treadmill did develop left chest and arm pain. She achieved target heart rate. EKG was essentially normal with no significant changes concerning for ischemia. No perfusion abnormality.   Cardiac catheterization January 20 2010: mild atherosclerosis in the mid left circumflex after the takeoff of the OM1, mild diffuse LAD disease repeat echocardiogram on March 19, 2010 showing ejection fraction 38-88%, diastolic dysfunction, mild LVH   PMH:   has a past medical history of Asthma, CAD (coronary artery disease), Depression, Hemorrhoids, History of pneumonia (1969), HLD (hyperlipidemia), Hypothyroidism, IBS (irritable bowel syndrome), Migraine headache, Myocarditis ([place]), and Prediabetes.  PSH:    Past Surgical History:  Procedure Laterality Date  . APPENDECTOMY  1974  . BREAST BIOPSY Right 1988   neg  . CARDIAC CATHETERIZATION  8/11   no significant -[facility]- Dr Rockey Situ,  . CHOLECYSTECTOMY  2002  . COLONOSCOPY  2013  . CYST EXCISION    . NEPHROLITHOTOMY Right 08/20/2014   Procedure: RIGHT PERCUTANEOUS NEPHROLITHOTOMY ;  Surgeon: Malka So, MD;  Location: [facility];  Service: Urology;  Laterality: Right;  . TOTAL ABDOMINAL HYSTERECTOMY W/ BILATERAL SALPINGOOPHORECTOMY  2000   no ovaries, uterus, or cervix.    Current Outpatient Medications  Medication Sig Dispense Refill  . aspirin EC 81 MG tablet Take 81 mg by mouth daily.    . cyclobenzaprine (FLEXERIL) 10 MG tablet TAKE 1 TABLET (10 MG TOTAL) BY MOUTH 2 (  TWO) TIMES DAILY AS NEEDED (PAIN).    Marland Kitchen dicyclomine (BENTYL) 10 MG capsule Take 1 capsule (10 mg total) by mouth 4 (four) times daily -  before meals and at bedtime. 15 capsule 0  . fenofibrate 160 MG tablet TAKE ONE TABLET BY MOUTH DAILY 90 tablet 3  . gabapentin (NEURONTIN) 300 MG  capsule Take 1 capsule (300 mg total) by mouth at bedtime. 90 capsule 3  . glucose blood (ACCU-CHEK AVIVA) test strip Use to check blood sugar once daily 100 each 3  . levothyroxine (SYNTHROID, LEVOTHROID) 75 MCG tablet Take 1 tablet (75 mcg total) by mouth daily with breakfast. 90 tablet 3  . magnesium gluconate (MAGONATE) 500 MG tablet Take 500 mg by mouth daily.     . Multiple Vitamins-Minerals (CENTRUM SILVER) tablet Take 1 tablet by mouth daily.    . nitroGLYCERIN (NITROSTAT) 0.4 MG SL tablet 1 tablet under tongue at onset of chest pain; you may repeat every 5 minutes for up to 3 doses. 25 tablet 3  . ondansetron (ZOFRAN ODT) 4 MG disintegrating tablet Take 1 tablet (4 mg total) by mouth every 8 (eight) hours as needed for nausea or vomiting. 20 tablet 0  . oseltamivir (TAMIFLU) 75 MG capsule Take 1 capsule (75 mg total) by mouth 2 (two) times daily. 10 capsule 0  . pantoprazole (PROTONIX) 40 MG tablet Take 1 tablet (40 mg total) by mouth daily. 90 tablet 3  . Prasterone (INTRAROSA) 6.5 MG INST Place 6.5 mg vaginally at bedtime. 30 each 11  . pravastatin (PRAVACHOL) 40 MG tablet Take 1 tablet (40 mg total) by mouth at bedtime. 90 tablet 3  . sertraline (ZOLOFT) 50 MG tablet Take 1 tablet (50 mg total) by mouth daily. 90 tablet 3   No current facility-administered medications for this visit.      Allergies:   Pseudoeph-doxylamine-dm-apap; Gluten; and Penicillins   Social History:  The patient  reports that she quit smoking about 9 years ago. Her smoking use included cigarettes. She has a 12.25 pack-year smoking history. She has never used smokeless tobacco. She reports that she drinks alcohol. She reports that she does not use drugs.   Family History:   family history includes ALS in her mother; Breast cancer in her paternal grandmother; Breast cancer (age of onset: 55) in her sister; Cancer (age of onset: 28) in her sister; Colon cancer in her maternal grandfather; Coronary artery disease  in her mother; Diabetes in her mother; Down syndrome in her son; Hyperlipidemia in her father; Lung cancer in her paternal grandfather; Other in her daughter.    Review of Systems: Review of Systems  Constitutional: Negative.   Respiratory: Positive for shortness of breath.   Cardiovascular: Negative.        Tachycardia  Gastrointestinal: Negative.   Musculoskeletal: Negative.        Left arm pain  Neurological: Negative.   Psychiatric/Behavioral: Negative.   All other systems reviewed and are negative.    PHYSICAL EXAM: VS:  BP 128/80   Pulse (!) 104   Ht 5\' 4"  (1.626 m)   Wt 172 lb 4 oz (78.1 kg)   BMI 29.57 kg/m  , BMI Body mass index is 29.57 kg/m. Constitutional:  oriented to person, place, and time. No distress.  HENT:  Head: Normocephalic and atraumatic.  Eyes:  no discharge. No scleral icterus.  Neck: Normal range of motion. Neck supple. No JVD present.  Cardiovascular: Regular, tachycardic, normal heart sounds and intact distal pulses.  Exam reveals no gallop and no friction rub. No edema No murmur heard. Pulmonary/Chest: Effort normal and breath sounds normal. No stridor. No respiratory distress.  no wheezes.  no rales.  no tenderness.  Abdominal: Soft.  no distension.  no tenderness.  Musculoskeletal: Normal range of motion.  no  tenderness or deformity.  Neurological:  normal muscle tone. Coordination normal. No atrophy Skin: Skin is warm and dry. No rash noted. not diaphoretic.  Psychiatric:  normal mood and affect. behavior is normal. Thought content normal.    Recent Labs: 05/23/2017: ALT 39; BUN 14; Creatinine, Ser 0.72; Hemoglobin 13.3; Platelets 272.0; Potassium 4.2; Sodium 141; TSH 2.87    Lipid Panel Lab Results  Component Value Date   CHOL 144 05/23/2017   HDL 42.80 05/23/2017   LDLCALC 39 06/23/2016   TRIG 213.0 (H) 05/23/2017      Wt Readings from Last 3 Encounters:  10/05/17 172 lb 4 oz (78.1 kg)  07/24/17 172 lb (78 kg)  05/23/17 172 lb  12 oz (78.4 kg)       ASSESSMENT AND PLAN:  Shortness of breath Etiology unclear, tachycardia on today's visit of uncertain etiology Echocardiogram has been ordered to rule out causes of shortness of breath and tachycardia Previously normal echocardiogram and normal stress test, CT scan with no coronary calcification or aortic atherosclerosis  Sinus tachycardia Normal thyroid Echocardiogram pending Unclear if anxiety playing a role for stress If needed and heart rate persists around current level could start metoprolol succinate 25 mg daily  Mixed hyperlipidemia Cholesterol is at goal on the current lipid regimen. No changes to the medications were made. Stable  Coronary artery disease involving native coronary artery of native heart with angina pectoris [facility]) Previous stress test showing no ischemia,  CT scan reviewed with her showing no significant coronary disease. No further testing needed Similar symptoms in the past 2011, 2015 Similar symptoms today  Other chest pain Atypical chest pain  Previous work-up unrevealing  Anxiety Long history of stress at home Son with Down syndrome. Possible caretaker stressors Still with stressors possibly contributing to tachycardia and shortness of breath   Left arm pain Atypical in nature, unable to exclude paravertebral muscle spasms, cervical disc disease    Total encounter time more than 25 minutes  Greater than 50% was spent in counseling and coordination of care with the patient  Disposition:   F/U  12 months as needed     Signed, Esmond Plants, M.D., Ph.D. 10/05/2017  [place], [place]

## 2017-10-05 ENCOUNTER — Other Ambulatory Visit: Payer: Self-pay

## 2017-10-05 ENCOUNTER — Ambulatory Visit (INDEPENDENT_AMBULATORY_CARE_PROVIDER_SITE_OTHER): Payer: 59

## 2017-10-05 ENCOUNTER — Ambulatory Visit: Payer: 59 | Admitting: Cardiovascular Disease

## 2017-10-05 ENCOUNTER — Encounter: Payer: Self-pay | Admitting: Cardiovascular Disease

## 2017-10-05 VITALS — BP 128/80 | HR 104 | Ht 64.0 in | Wt 172.2 lb

## 2017-10-05 DIAGNOSIS — R0602 Shortness of breath: Secondary | ICD-10-CM

## 2017-10-05 DIAGNOSIS — F17201 Nicotine dependence, unspecified, in remission: Secondary | ICD-10-CM | POA: Diagnosis not present

## 2017-10-05 DIAGNOSIS — E782 Mixed hyperlipidemia: Secondary | ICD-10-CM

## 2017-10-05 DIAGNOSIS — R079 Chest pain, unspecified: Secondary | ICD-10-CM | POA: Diagnosis not present

## 2017-10-05 DIAGNOSIS — I2510 Atherosclerotic heart disease of native coronary artery without angina pectoris: Secondary | ICD-10-CM

## 2017-10-05 DIAGNOSIS — F3342 Major depressive disorder, recurrent, in full remission: Secondary | ICD-10-CM | POA: Diagnosis not present

## 2017-10-05 LAB — ECHOCARDIOGRAM COMPLETE
Height: 64 in
Weight: 2756 oz

## 2017-10-05 NOTE — Patient Instructions (Addendum)
Medication Instructions:   No medication changes made  Labwork:  No new labs needed  Testing/Procedures:  We will order an echocardiogram for SOB, tachycardia, CAD  Go to the app store Search for "pulse meter" Instant heart rate Monitor heart rate   Follow-Up: It was a pleasure seeing you in the office today. Please call us if you have new issues that need to be addressed before your next appt.  [phone]  Your physician wants you to follow-up in: as needed  If you need a refill on your cardiac medications before your next appointment, please call your pharmacy.  For educational health videos Log in to : [email or web address] Or : [email or web address], password : [email or web address]

## 2017-10-07 ENCOUNTER — Encounter: Payer: Self-pay | Admitting: Cardiovascular Disease

## 2017-10-09 ENCOUNTER — Other Ambulatory Visit: Payer: Self-pay | Admitting: *Deleted

## 2017-10-09 MED ORDER — METOPROLOL SUCCINATE ER 50 MG PO TB24: 50.0000 mg | ORAL_TABLET | Freq: Every day | ORAL | 3 refills | Status: DC

## 2017-10-10 ENCOUNTER — Ambulatory Visit
Admission: RE | Admit: 2017-10-10 | Discharge: 2017-10-10 | Disposition: A | Discharge status: Home / Self Care | Payer: 59 | Source: Ambulatory Visit | Attending: Obstetrics & Gynecology | Admitting: Obstetrics & Gynecology

## 2017-10-10 DIAGNOSIS — Z1231 Encounter for screening mammogram for malignant neoplasm of breast: Secondary | ICD-10-CM | POA: Insufficient documentation

## 2017-10-10 IMAGING — MG MM DIGITAL SCREENING BILAT W/ TOMO W/ CAD
8 series · 8 of 24 positions shown · non-contrast
Comparison: Previous exam(s).

CLINICAL DATA: Screening.

EXAM:
DIGITAL SCREENING BILATERAL MAMMOGRAM WITH TOMO AND CAD

[R CC synth-2D]
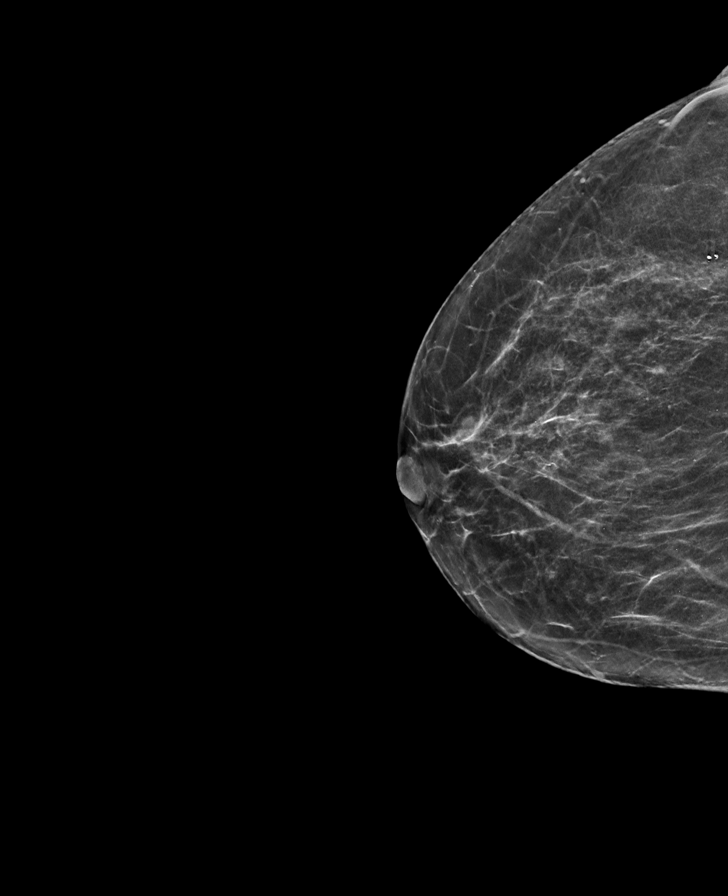

[L MLO synth-2D]
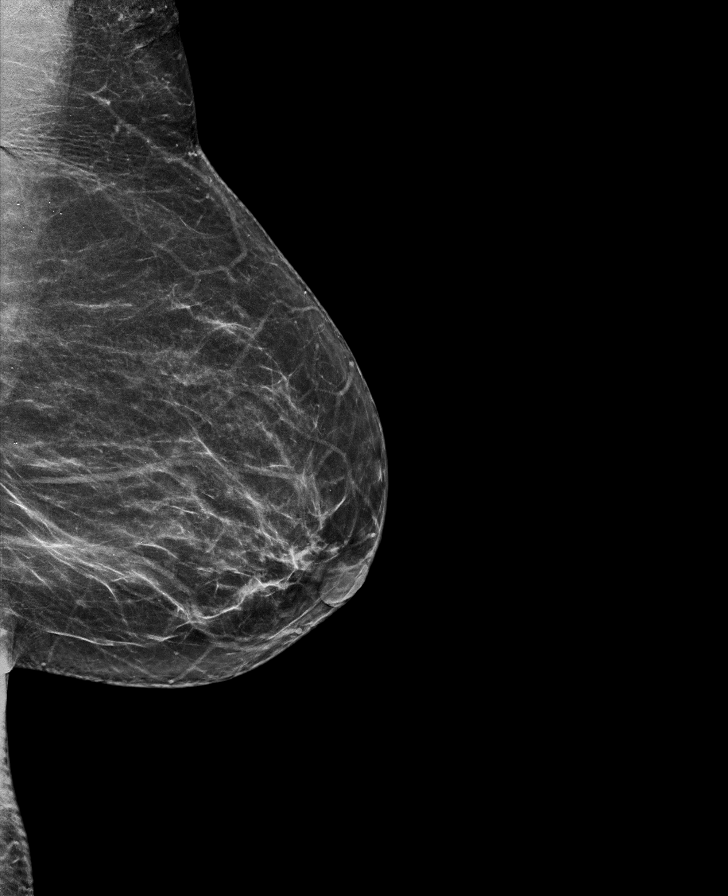

[R MLO synth-2D]
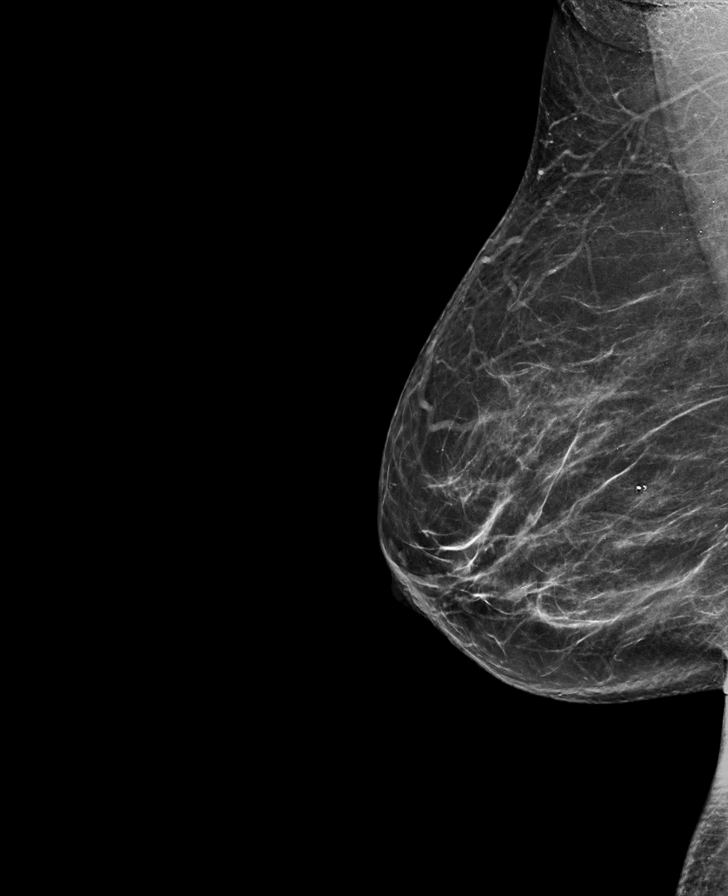

[L CC synth-2D]
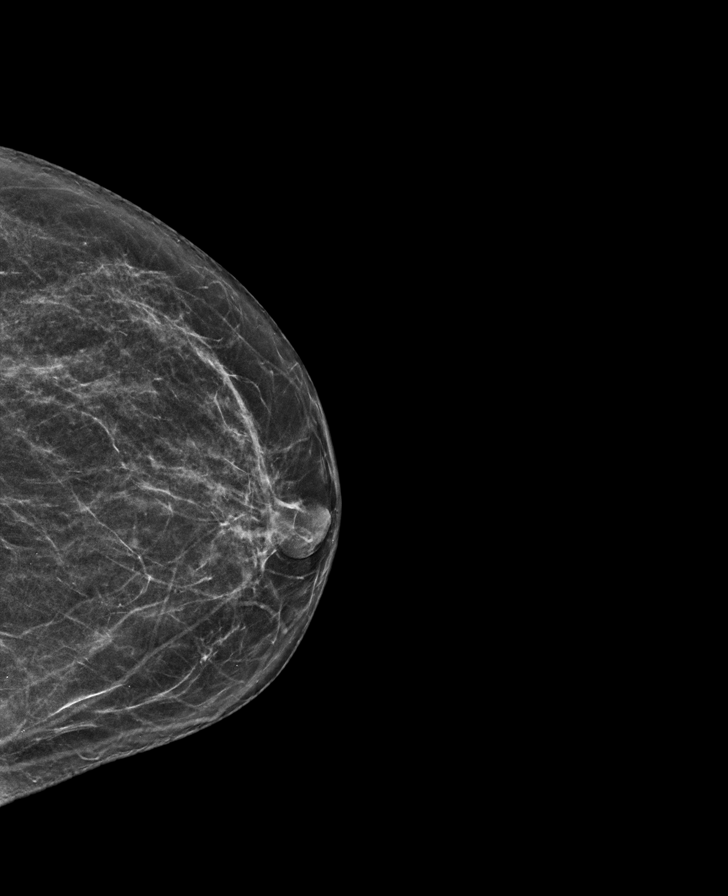

[L MLO tomo · tomo slice 33/66.0]
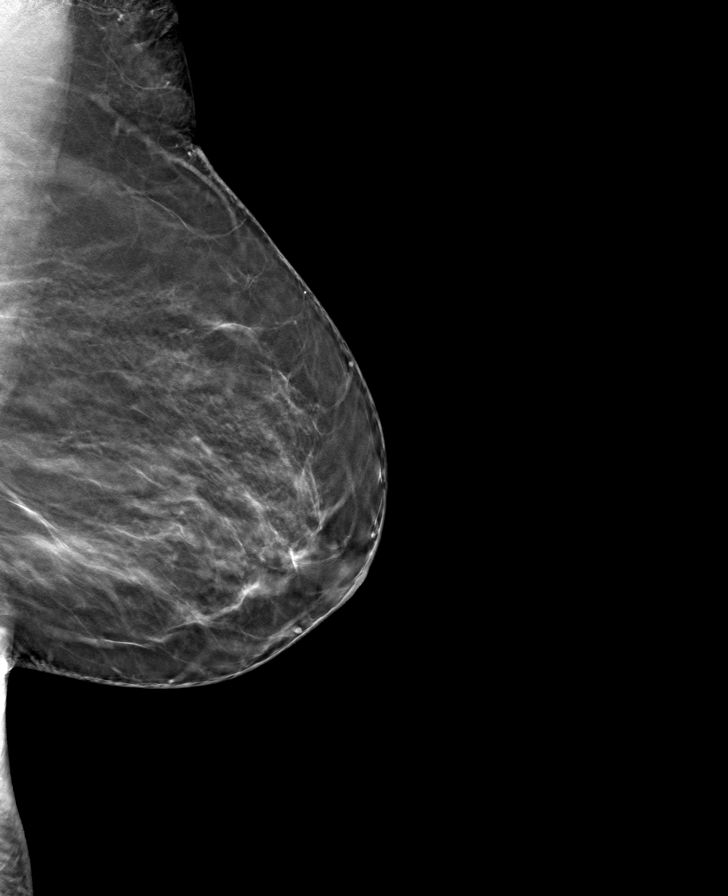

[L CC tomo · tomo slice 29/57.0]
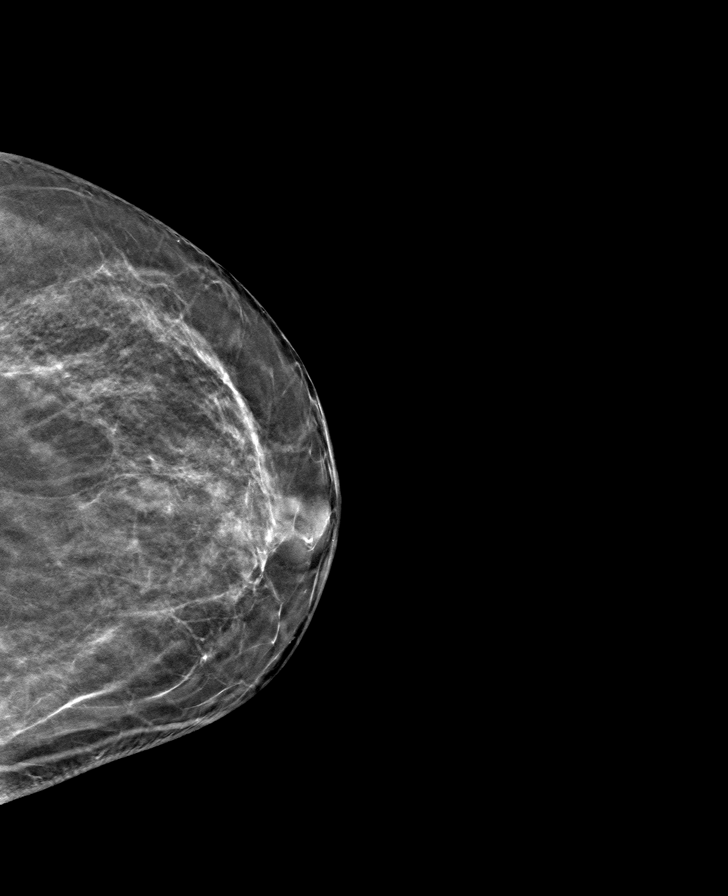

[R CC tomo · tomo slice 29/57.0]
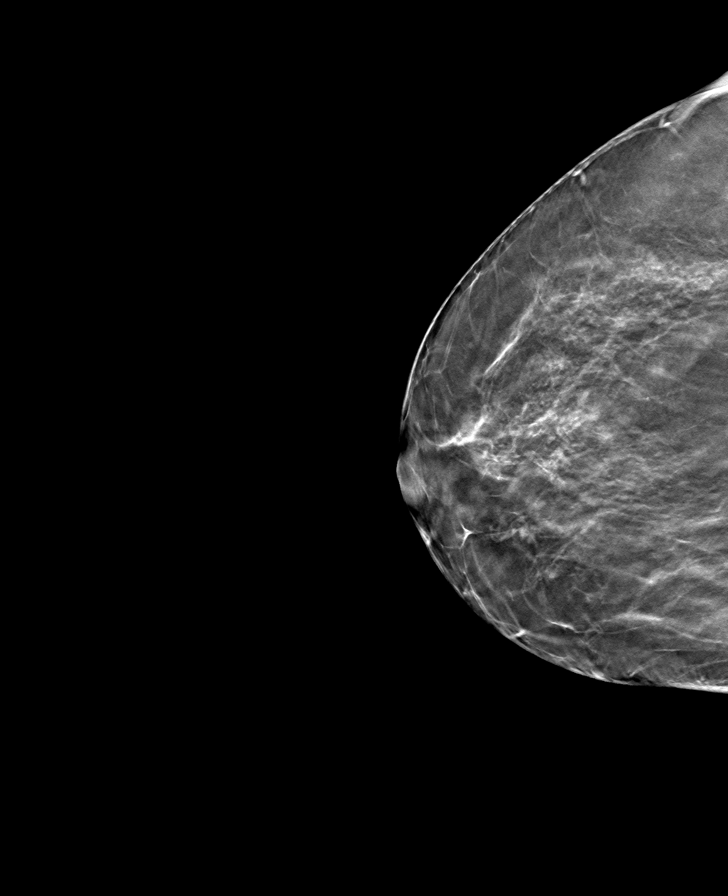

[R MLO tomo · tomo slice 33/66.0]
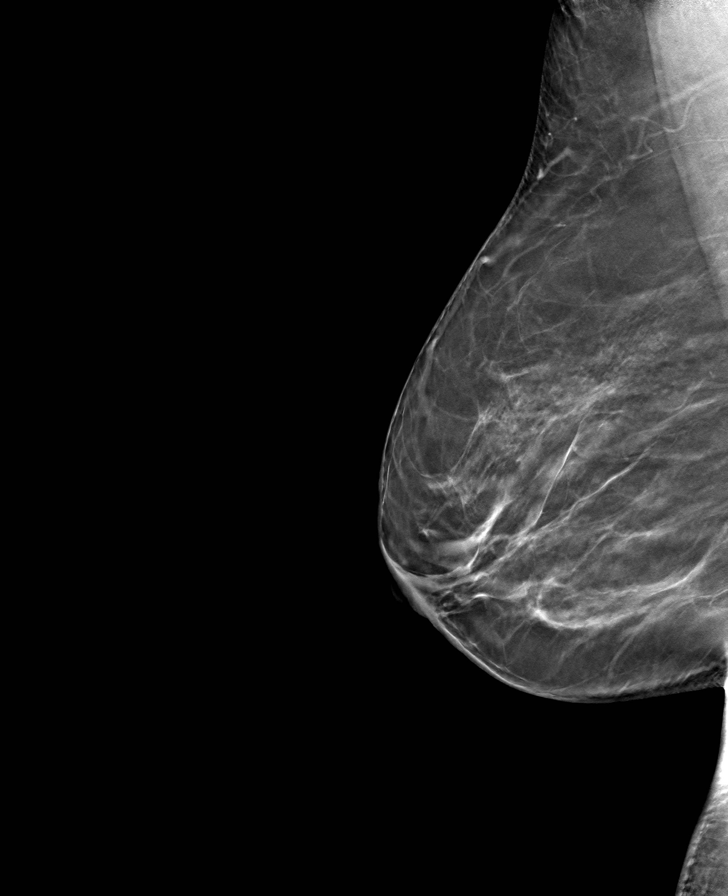

[8 of 24 positions shown; findings below may reference images not displayed]

ACR Breast Density Category b: There are scattered areas of
fibroglandular density.
FINDINGS: There are no findings suspicious for malignancy. Images were
processed with CAD.
IMPRESSION: No mammographic evidence of malignancy. A result letter of this
screening mammogram will be mailed directly to the patient.

RECOMMENDATION:
Screening mammogram in one year. (Code:[TQ])

BI-RADS CATEGORY  1: Negative.

## 2017-11-29 ENCOUNTER — Encounter: Payer: Self-pay | Admitting: Physician Assistant

## 2017-12-12 ENCOUNTER — Ambulatory Visit (INDEPENDENT_AMBULATORY_CARE_PROVIDER_SITE_OTHER): Payer: 59 | Admitting: Physician Assistant

## 2017-12-12 ENCOUNTER — Encounter: Payer: Self-pay | Admitting: Physician Assistant

## 2017-12-12 VITALS — BP 124/78 | HR 72 | Ht 64.0 in | Wt 165.4 lb

## 2017-12-12 DIAGNOSIS — K602 Anal fissure, unspecified: Secondary | ICD-10-CM | POA: Diagnosis not present

## 2017-12-12 DIAGNOSIS — Z86010 Personal history of colonic polyps: Secondary | ICD-10-CM | POA: Diagnosis not present

## 2017-12-12 DIAGNOSIS — Z1211 Encounter for screening for malignant neoplasm of colon: Secondary | ICD-10-CM | POA: Diagnosis not present

## 2017-12-12 MED ORDER — NA SULFATE-K SULFATE-MG SULF 17.5-3.13-1.6 GM/177ML PO SOLN: ORAL | 0 refills | Status: DC

## 2017-12-12 MED ORDER — AMBULATORY NON FORMULARY MEDICATION: 2 refills | Status: DC

## 2017-12-12 NOTE — Patient Instructions (Addendum)
We have sent the following medications to your pharmacy for you to pick up at your convenience: [facility], [facility]. 1. Lidocaine/Diltiazem gel  The Suprep for the colonoscopy we sent the script to [facility].  You have been scheduled for a colonoscopy. Please follow written instructions given to you at your visit today.  Please pick up your prep supplies at the pharmacy within the next 1-3 days. If you use inhalers (even only as needed), please bring them with you on the day of your procedure.   If you are age 62 or younger, your body mass index should be between 19-25. Your Body mass index is 28.39 kg/m. If this is out of the aformentioned range listed, please consider follow up with your Primary Care Provider.

## 2017-12-12 NOTE — Progress Notes (Signed)
Subjective:    Patient ID: Brandi Mahoney, female    DOB: 06-14-1956, 62 y.o.   MRN: [id number]  HPI Brandi Mahoney is a pleasant 62 year old white female, known to Dr. Fuller Plan who was last seen in the office in 2013.  She comes in today with complaints of rectal pain and bleeding with bowel movements over the past couple of months. She last had colonoscopy in May 2013 with finding of 2 4 mm polyps in the transverse colon both of which were tubular adenomas and internal hemorrhoids.  She is overdue for 5-year interval follow-up. She also has history of coronary artery disease, depression, hypothyroidism and IBS. Patient says she started noticing rectal pain with bowel movements about 2 months ago.  Her symptoms have been persistent though not worsening.  She has discomfort with every bowel movement and says she is frequently passing a small amount of bright red blood usually seen on the tissue with a bowel movement as well.  She describes it as a tearing feeling with each bowel movement.  She does not recall having any hard stools or excessive straining when her symptoms started.  She is generally having bowel movements on a daily basis and is not having difficulty with constipation though she has added Benefiber and prune since her symptoms started. She denies any abdominal pain. She has  tried Preparation H over-the-counter without any change in symptoms.  Review of Systems Pertinent positive and negative review of systems were noted in the above HPI section.  All other review of systems was otherwise negative.  Outpatient Encounter Medications as of 12/12/2017  Medication Sig  . aspirin EC 81 MG tablet Take 81 mg by mouth daily.  . cyclobenzaprine (FLEXERIL) 10 MG tablet TAKE 1 TABLET (10 MG TOTAL) BY MOUTH 2 (TWO) TIMES DAILY AS NEEDED (PAIN).  . fenofibrate 160 MG tablet TAKE ONE TABLET BY MOUTH DAILY  . gabapentin (NEURONTIN) 300 MG capsule Take 1 capsule (300 mg total) by mouth at bedtime.  Marland Kitchen  glucose blood (ACCU-CHEK AVIVA) test strip Use to check blood sugar once daily  . levothyroxine (SYNTHROID, LEVOTHROID) 75 MCG tablet Take 1 tablet (75 mcg total) by mouth daily with breakfast.  . magnesium gluconate (MAGONATE) 500 MG tablet Take 500 mg by mouth daily.   . metoprolol succinate (TOPROL-XL) 50 MG 24 hr tablet Take 1 tablet (50 mg total) by mouth daily. Take with or immediately following a meal.  . Multiple Vitamins-Minerals (CENTRUM SILVER) tablet Take 1 tablet by mouth daily.  . nitroGLYCERIN (NITROSTAT) 0.4 MG SL tablet 1 tablet under tongue at onset of chest pain; you may repeat every 5 minutes for up to 3 doses.  Marland Kitchen ondansetron (ZOFRAN ODT) 4 MG disintegrating tablet Take 1 tablet (4 mg total) by mouth every 8 (eight) hours as needed for nausea or vomiting.  . pantoprazole (PROTONIX) 40 MG tablet Take 1 tablet (40 mg total) by mouth daily.  . pravastatin (PRAVACHOL) 40 MG tablet Take 1 tablet (40 mg total) by mouth at bedtime.  . sertraline (ZOLOFT) 50 MG tablet Take 1 tablet (50 mg total) by mouth daily.  . AMBULATORY NON FORMULARY MEDICATION Medication Name: Lidocaine 5 %/ Diltiazem gel 2 % Apply 1/2 to 1 in inside anus, 3-4 times daily.  . Na Sulfate-K Sulfate-Mg Sulf 17.5-3.13-1.6 GM/177ML SOLN Take as directed for colonoscopy prep.  . [DISCONTINUED] dicyclomine (BENTYL) 10 MG capsule Take 1 capsule (10 mg total) by mouth 4 (four) times daily -  before meals and  at bedtime.  . [DISCONTINUED] oseltamivir (TAMIFLU) 75 MG capsule Take 1 capsule (75 mg total) by mouth 2 (two) times daily.  . [DISCONTINUED] Prasterone (INTRAROSA) 6.5 MG INST Place 6.5 mg vaginally at bedtime.   No facility-administered encounter medications on file as of 12/12/2017.    Allergies  Allergen Reactions  . Pseudoeph-Doxylamine-Dm-Apap Anaphylaxis    Allergy to nyquil  . Gluten Other (See Comments)    Gi upset  . Penicillins Itching and Rash   Patient Active Problem List   Diagnosis Date Noted    . Flu-like symptoms 07/24/2017  . Lichen sclerosus 73/71/0626  . Vaginal atrophy 09/26/2016  . Coronary artery disease involving native coronary artery of native heart with angina pectoris ([place])   . Borderline diabetes   . Anxiety   . Renal stone   . Obstructive sleep apnea 09/25/2013  . 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 IBS (irritable bowel syndrome) 09/26/2012  . PARESTHESIA, HANDS 06/01/2010  . Mixed hyperlipidemia 04/01/2010  . 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 HYPERTRIGLYCERIDEMIA 07/21/2009  . Hypothyroidism 09/08/2008  . Major depressive disorder, recurrent episode, in full remission ([place]) 09/08/2008  . ASTHMA 09/08/2008   Social History   Socioeconomic History  . Marital status: Married    Spouse name: Nicole Kindred  . Number of children: 3  . Years of education: Not on file  . Highest education level: Not on file  Occupational History  . 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   Social Needs  . Financial resource strain: Not on file  . Food insecurity:    Worry: Not on file    Inability: Not on file  . Transportation needs:    Medical: Not on file    Non-medical: Not on file  Tobacco Use  . Smoking status: Former Smoker    Packs/day: 0.35    Years: 35.00    Pack years: 12.25    Types: Cigarettes    Last attempt to quit: 09/25/2008    Years since quitting: 9.2  . Smokeless tobacco: Never Used  . Tobacco comment: quit in 2007-2008, social smoker  Substance and Sexual Activity  . Alcohol use: Yes    Comment: rarely  . Drug use: No  . Sexual activity: Not on file  Lifestyle  . Physical activity:    Days per week: Not on file    Minutes per session: Not on file  . Stress: Not on file  Relationships  . Social connections:    Talks on phone: Not on file    Gets together: Not on file    Attends religious service: Not on file    Active member of club or organization: Not on file     Attends meetings of clubs or organizations: Not on file    Relationship status: Not on file  . Intimate partner violence:    Fear of current or ex partner: Not on file    Emotionally abused: Not on file    Physically abused: Not on file    Forced sexual activity: Not on file  Other Topics Concern  . Not on file  Social History Narrative   Lives in [place] w/ husband.  No reg exercise.    Ms. Weide's family history includes ALS in her mother; Breast cancer in her paternal grandmother; Breast cancer (age of onset: 95) in her sister; Cancer (age of onset: 36) in her sister; Colon cancer in her  maternal grandfather; Coronary artery disease in her mother; Diabetes in her mother; Down syndrome in her son; Hyperlipidemia in her father; Lung cancer in her paternal grandfather; Other in her daughter.      Objective:    Vitals:   12/12/17 1412  BP: 124/78  Pulse: 72    Physical Exam; well-developed white female in no acute distress, pleasant, blood pressure 124/78 pulse 72, height 5 foot 4, weight 165, BMI 28.3.  HEENT; nontraumatic normocephalic EOMI PERRLA sclera anicteric, Oropharynx clear, Cardiovascular; regular rate and rhythm with S1-S2 no murmur rub or gallop, Pulmonary; clear bilaterally, Abdomen ; no focal tenderness, nondistended, bowel sounds are present no palpable mass or hepatosplenomegaly, Rectal; exam she has external hemorrhoidal tags, noninflamed and on digital exam has a posterior fissure.  Extremities; no clubbing cyanosis or edema skin warm and dry, Neuro psych; alert and oriented, grossly nonfocal mood and affect appropriate       Assessment & Plan:   #7 62 year old white female with posterior anal fissure #2 history of adenomatous colon polyps overdue for follow-up colonoscopy  #3 hypothyroidism #4.  Depression #5.  Coronary artery disease # 6 internal hemorrhoids  Plan; Patient will continue Benefiber daily and liberal water intake, high-fiber diet We  discussed soaking in hot water or hot water with Epsom  salts daily for 15 to 20 minutes until symptoms subside Start lidocaine 5%/diltiazem 2% ointment applied 1/2 to 1 inch inside the anus 3 times daily for 4 to 6 weeks until symptoms have completely resolved.  We discussed the slow nature of healing of anal fissures and that these can take up to 3 months to completely heal. She will be scheduled for colonoscopy with Dr. Fuller Plan.  Procedure was discussed in detail with the patient including indications risks and benefits and she is agreeable to proceed.  Floyd Lusignan S Jesiel Garate PA-C 12/12/2017   Cc: Owens Loffler, MD

## 2017-12-12 NOTE — Progress Notes (Signed)
Reviewed and agree with management plan.  Malcolm T. Stark, MD FACG 

## 2018-01-02 ENCOUNTER — Ambulatory Visit (AMBULATORY_SURGERY_CENTER): Payer: 59 | Admitting: Gastroenterology

## 2018-01-02 ENCOUNTER — Encounter: Payer: Self-pay | Admitting: Gastroenterology

## 2018-01-02 VITALS — BP 119/74 | HR 16 | Temp 98.6°F | Resp 15 | Ht 64.0 in | Wt 165.0 lb

## 2018-01-02 DIAGNOSIS — D122 Benign neoplasm of ascending colon: Secondary | ICD-10-CM | POA: Diagnosis not present

## 2018-01-02 DIAGNOSIS — D123 Benign neoplasm of transverse colon: Secondary | ICD-10-CM

## 2018-01-02 DIAGNOSIS — Z86010 Personal history of colonic polyps: Secondary | ICD-10-CM | POA: Diagnosis not present

## 2018-01-02 DIAGNOSIS — Z1211 Encounter for screening for malignant neoplasm of colon: Secondary | ICD-10-CM | POA: Diagnosis not present

## 2018-01-02 MED ORDER — SODIUM CHLORIDE 0.9 % IV SOLN: 500.0000 mL | INTRAVENOUS | Status: DC

## 2018-01-02 NOTE — Progress Notes (Signed)
I have reviewed the patient's medical history in detail and updated the computerized patient record.

## 2018-01-02 NOTE — Progress Notes (Signed)
A and O x3. Report to RN. Tolerated MAC anesthesia well.

## 2018-01-02 NOTE — Op Note (Addendum)
[place] Patient Name: Brandi Mahoney Procedure Date: 01/02/2018 11:07 AM MRN: [id number] Endoscopist: Ladene Artist , MD Age: 62 Referring MD:  Date of Birth: Sep 04, 1955 Gender: Female Account #: [id number] Procedure:                Colonoscopy Indications:              Surveillance: Personal history of adenomatous                            polyps on last colonoscopy 5 years ago Medicines:                Monitored Anesthesia Care Procedure:                Pre-Anesthesia Assessment:                           - Prior to the procedure, a History and Physical                            was performed, and patient medications and                            allergies were reviewed. The patient's tolerance of                            previous anesthesia was also reviewed. The risks                            and benefits of the procedure and the sedation                            options and risks were discussed with the patient.                            All questions were answered, and informed consent                            was obtained. Prior Anticoagulants: The patient has                            taken no previous anticoagulant or antiplatelet                            agents. ASA Grade Assessment: II - A patient with                            mild systemic disease. After reviewing the risks                            and benefits, the patient was deemed in                            satisfactory condition to undergo the procedure.  After obtaining informed consent, the colonoscope                            was passed under direct vision. Throughout the                            procedure, the patient's blood pressure, pulse, and                            oxygen saturations were monitored continuously. The                            Model PCF-[id number]) scope was introduced                            through the anus and  advanced to the the cecum,                            identified by appendiceal orifice and ileocecal                            valve. The ileocecal valve, appendiceal orifice,                            and rectum were photographed. The quality of the                            bowel preparation was good. The colonoscopy was                            performed without difficulty. The patient tolerated                            the procedure well. Scope In: 11:14:11 AM Scope Out: 11:32:19 AM Scope Withdrawal Time: 0 hours 13 minutes 36 seconds  Total Procedure Duration: 0 hours 18 minutes 8 seconds  Findings:                 A posterior anal fissure was found on perianal exam.                           A 9 mm polyp was found in the ascending colon. The                            polyp was sessile. The polyp was removed with a hot                            snare. Resection and retrieval were complete.                           Two sessile polyps were found in the transverse                            colon. The polyps  were 6 to 7 mm in size. These                            polyps were removed with a cold snare. Resection                            and retrieval were complete.                           Internal hemorrhoids were found during                            retroflexion. The hemorrhoids were small and Grade                            I (internal hemorrhoids that do not prolapse).                           The exam was otherwise without abnormality on                            direct and retroflexion views. Complications:            No immediate complications. Estimated blood loss:                            None. Estimated Blood Loss:     Estimated blood loss: none. Impression:               - Anal fissure found on perianal exam.                           - One 9 mm polyp in the ascending colon, removed                            with a hot snare. Resected and retrieved.                            - Two 6 to 7 mm polyps in the transverse colon,                            removed with a cold snare. Resected and retrieved.                           - Internal hemorrhoids.                           - The examination was otherwise normal on direct                            and retroflexion views. Recommendation:           - Repeat colonoscopy in 5 years for surveillance.                           - Patient has  a contact number available for                            emergencies. The signs and symptoms of potential                            delayed complications were discussed with the                            patient. Return to normal activities tomorrow.                            Written discharge instructions were provided to the                            patient.                           - Resume previous diet.                           - Continue present medications.                           - Await pathology results.                           - Diltiazem 2% ointment topically qid for 8 weeks.                           - Lidocaine cream OTC topically qid prn pain.                           - GI office in 8 weeks. Ladene Artist, MD 01/02/2018 11:41:52 AM This report has been signed electronically.

## 2018-01-02 NOTE — Patient Instructions (Addendum)
Thank you for allowing [place] to care for you today!  Await pathology results by mail, approximately 2-3 weeks.  Hand-outs given for polyps and hemorrhoids.  Diltiazem 2% ointment to skin 4 times/day x 8 weeks.  Lidocaine cream (over the counter) to area four times/day as needed for pain.    YOU HAD AN ENDOSCOPIC PROCEDURE TODAY AT [facility]:   Refer to the procedure report that was given to you for any specific questions about what was found during the examination.  If the procedure report does not answer your questions, please call your gastroenterologist to clarify.  If you requested that your care partner not be given the details of your procedure findings, then the procedure report has been included in a sealed envelope for you to review at your convenience later.  YOU SHOULD EXPECT: Some feelings of bloating in the abdomen. Passage of more gas than usual.  Walking can help get rid of the air that was put into your GI tract during the procedure and reduce the bloating. If you had a lower endoscopy (such as a colonoscopy or flexible sigmoidoscopy) you may notice spotting of blood in your stool or on the toilet paper. If you underwent a bowel prep for your procedure, you may not have a normal bowel movement for a few days.  Please Note:  You might notice some irritation and congestion in your nose or some drainage.  This is from the oxygen used during your procedure.  There is no need for concern and it should clear up in a day or so.  SYMPTOMS TO REPORT IMMEDIATELY:   Following lower endoscopy (colonoscopy or flexible sigmoidoscopy):  Excessive amounts of blood in the stool  Significant tenderness or worsening of abdominal pains  Swelling of the abdomen that is new, acute  Fever of 100F or higher    For urgent or emergent issues, a gastroenterologist can be reached at any hour by calling [phone].   DIET:  We do recommend a small meal at first, but then  you may proceed to your regular diet.  Drink plenty of fluids but you should avoid alcoholic beverages for 24 hours.  ACTIVITY:  You should plan to take it easy for the rest of today and you should NOT DRIVE or use heavy machinery until tomorrow (because of the sedation medicines used during the test).    FOLLOW UP: Our staff will call the number listed on your records the next business day following your procedure to check on you and address any questions or concerns that you may have regarding the information given to you following your procedure. If we do not reach you, we will leave a message.  However, if you are feeling well and you are not experiencing any problems, there is no need to return our call.  We will assume that you have returned to your regular daily activities without incident.  If any biopsies were taken you will be contacted by phone or by letter within the next 1-3 weeks.  Please call us at [phone] if you have not heard about the biopsies in 3 weeks.    SIGNATURES/CONFIDENTIALITY: You and/or your care partner have signed paperwork which will be entered into your electronic medical record.  These signatures attest to the fact that that the information above on your After Visit Summary has been reviewed and is understood.  Full responsibility of the confidentiality of this discharge information lies with you and/or your care-partner.

## 2018-01-03 ENCOUNTER — Telehealth: Payer: Self-pay

## 2018-01-03 NOTE — Telephone Encounter (Signed)
  Follow up Call-  Call back number 01/02/2018  Post procedure Call Back phone  # [phone] Nicole Kindred - Husband  Permission to leave phone message Yes  Some recent data might be hidden     Patient questions:  Do you have a fever, pain , or abdominal swelling? No. Pain Score  0 *  Have you tolerated food without any problems? Yes.    Have you been able to return to your normal activities? Yes.    Do you have any questions about your discharge instructions: Diet   No. Medications  No. Follow up visit  No.  Do you have questions or concerns about your Care? No.  Actions: * If pain score is 4 or above: No action needed, pain <4.

## 2018-01-03 NOTE — Telephone Encounter (Signed)
Post procedure follow up call, left message

## 2018-01-10 ENCOUNTER — Encounter: Payer: Self-pay | Admitting: Gastroenterology

## 2018-01-12 ENCOUNTER — Telehealth: Payer: Self-pay | Admitting: Obstetrics & Gynecology

## 2018-01-14 NOTE — Telephone Encounter (Signed)
Called and left voice mail for patient to call back to be schedule °

## 2018-01-14 NOTE — Telephone Encounter (Signed)
Schedule Annual/ Follow up appt.  Refill authorized for now.

## 2018-01-15 NOTE — Telephone Encounter (Signed)
Called and left voice mail for patient to call back to be schedule °

## 2018-03-17 ENCOUNTER — Telehealth: Payer: Self-pay | Admitting: Obstetrics & Gynecology

## 2018-03-18 NOTE — Telephone Encounter (Signed)
Needs appt/follow up for her medicine.  Refill done til then.

## 2018-03-18 NOTE — Telephone Encounter (Signed)
Called and left voice mail for patient to call back to be schedule °

## 2018-04-04 DIAGNOSIS — Z23 Encounter for immunization: Secondary | ICD-10-CM | POA: Diagnosis not present

## 2018-05-08 ENCOUNTER — Other Ambulatory Visit: Payer: Self-pay | Admitting: Obstetrics & Gynecology

## 2018-06-04 ENCOUNTER — Other Ambulatory Visit: Payer: Self-pay | Admitting: Family Medicine

## 2018-06-04 DIAGNOSIS — G4733 Obstructive sleep apnea (adult) (pediatric): Secondary | ICD-10-CM | POA: Diagnosis not present

## 2018-06-04 DIAGNOSIS — G43019 Migraine without aura, intractable, without status migrainosus: Secondary | ICD-10-CM | POA: Diagnosis not present

## 2018-06-04 NOTE — Telephone Encounter (Signed)
Last office visit 05/23/2017 for CPE.  Last refilled 05/23/2017 for #90 with 3 refills.  No future appointments.  Refill?

## 2018-06-14 ENCOUNTER — Other Ambulatory Visit: Payer: Self-pay | Admitting: Family Medicine

## 2018-06-14 ENCOUNTER — Encounter: Payer: Self-pay | Admitting: *Deleted

## 2018-06-18 ENCOUNTER — Other Ambulatory Visit: Payer: Self-pay | Admitting: Neurology

## 2018-06-18 DIAGNOSIS — R1319 Other dysphagia: Secondary | ICD-10-CM

## 2018-07-23 ENCOUNTER — Ambulatory Visit: Payer: 59

## 2018-08-30 ENCOUNTER — Other Ambulatory Visit: Payer: Self-pay | Admitting: Obstetrics & Gynecology

## 2018-09-03 ENCOUNTER — Telehealth: Payer: Self-pay | Admitting: Obstetrics & Gynecology

## 2018-09-03 NOTE — Telephone Encounter (Signed)
Called and left voice mail for patient to call back to be schedule °

## 2018-09-03 NOTE — Telephone Encounter (Signed)
-----   Message from Gae Dry, MD sent at 08/30/2018  5:17 PM EDT ----- Regarding: needs appt

## 2018-09-04 ENCOUNTER — Other Ambulatory Visit: Payer: Self-pay | Admitting: Family Medicine

## 2018-09-04 ENCOUNTER — Other Ambulatory Visit: Payer: Self-pay | Admitting: *Deleted

## 2018-09-17 ENCOUNTER — Other Ambulatory Visit: Payer: Self-pay

## 2018-09-17 ENCOUNTER — Encounter: Payer: Self-pay | Admitting: Obstetrics & Gynecology

## 2018-09-17 ENCOUNTER — Other Ambulatory Visit: Payer: Self-pay | Admitting: Obstetrics & Gynecology

## 2018-09-17 ENCOUNTER — Ambulatory Visit (INDEPENDENT_AMBULATORY_CARE_PROVIDER_SITE_OTHER): Payer: BLUE CROSS/BLUE SHIELD | Admitting: Obstetrics & Gynecology

## 2018-09-17 DIAGNOSIS — L900 Lichen sclerosus et atrophicus: Secondary | ICD-10-CM | POA: Diagnosis not present

## 2018-09-17 DIAGNOSIS — N952 Postmenopausal atrophic vaginitis: Secondary | ICD-10-CM | POA: Diagnosis not present

## 2018-09-17 DIAGNOSIS — Z1239 Encounter for other screening for malignant neoplasm of breast: Secondary | ICD-10-CM

## 2018-09-17 MED ORDER — CLOBETASOL PROPIONATE 0.05 % EX CREA: TOPICAL_CREAM | CUTANEOUS | 3 refills | Status: DC

## 2018-09-17 MED ORDER — PRASTERONE 6.5 MG VA INST: 1.0000 | VAGINAL_INSERT | Freq: Every day | VAGINAL | 12 refills | Status: DC

## 2018-09-17 NOTE — Telephone Encounter (Signed)
Patient is schedule 09/17/18 with [facility]

## 2018-09-17 NOTE — Progress Notes (Signed)
Virtual Visit via Telephone Note  I connected with Brandi Mahoney on 09/17/18 at  3:50 PM EDT by telephone and verified that I am speaking with the correct person using two identifiers.   I discussed the limitations, risks, security and privacy concerns of performing an evaluation and management service by telephone and the availability of in person appointments. I also discussed with the patient that there may be a patient responsible charge related to this service. The patient expressed understanding and agreed to proceed.  She was at home and I was in my office.  History of Present Illness: Pt is a 63 yo WF treated for Lichen Sclerosus and Vaginal Atrophy; she has done well with the Intrarosa although she does not use it every day, and most often notes flares when she has not used it consistently.  No neg side effects, no worsening of hot flashes noted. Flares involve pain and dryness and irritation.  PMHx: She  has a past medical history of Asthma, Blood transfusion without reported diagnosis, CAD (coronary artery disease), Depression, Heart murmur, Hemorrhoids, History of pneumonia (1969), HLD (hyperlipidemia), Hypothyroidism, IBS (irritable bowel syndrome), Migraine headache, Myocarditis ([place]), Prediabetes, and Sleep apnea. Also,  has a past surgical history that includes Appendectomy (1974); Cholecystectomy (2002); Total abdominal hysterectomy w/ bilateral salpingoophorectomy (2000); Cardiac catheterization (8/11); Nephrolithotomy (Right, 08/20/2014); Colonoscopy (2013); Cyst excision; Breast excisional biopsy (Right, 1988); and Polypectomy., family history includes ALS in her mother; Breast cancer in her paternal grandmother; Breast cancer (age of onset: 29) in her sister; Cancer (age of onset: 82) in her sister; Colon cancer in her maternal grandfather; Coronary artery disease in her mother; Diabetes in her mother; Down syndrome in her son; Hyperlipidemia in her father; Lung cancer in her paternal  grandfather; Other in her daughter.,  reports that she quit smoking about 9 years ago. Her smoking use included cigarettes. She has a 12.25 pack-year smoking history. She has never used smokeless tobacco. She reports current alcohol use. She reports that she does not use drugs.  Current Outpatient Medications:  .  AMBULATORY NON FORMULARY MEDICATION, Medication Name: Lidocaine 5 %/ Diltiazem gel 2 % Apply 1/2 to 1 in inside anus, 3-4 times daily., Disp: 30 g, Rfl: 2 .  aspirin EC 81 MG tablet, Take 81 mg by mouth daily., Disp: , Rfl:  .  clobetasol cream (TEMOVATE) 0.05 %, APPLY TWO TIMES A DAY FOR 2 WEEKS THEN USE AS NEEDED FOR LICHEN SCLEROSIS, Disp: 45 g, Rfl: 3 .  cyclobenzaprine (FLEXERIL) 10 MG tablet, TAKE 1 TABLET (10 MG TOTAL) BY MOUTH 2 (TWO) TIMES DAILY AS NEEDED (PAIN)., Disp: , Rfl:  .  EMGALITY 120 MG/ML SOAJ, , Disp: , Rfl:  .  fenofibrate 160 MG tablet, TAKE ONE TABLET BY MOUTH DAILY, Disp: 90 tablet, Rfl: 3 .  gabapentin (NEURONTIN) 300 MG capsule, TAKE ONE CAPSULE BY MOUTH EVERY NIGHT AT BEDTIME, Disp: 30 capsule, Rfl: 2 .  glucose blood (ACCU-CHEK AVIVA) test strip, Use to check blood sugar once daily, Disp: 100 each, Rfl: 3 .  levothyroxine (SYNTHROID, LEVOTHROID) 75 MCG tablet, TAKE ONE TABLET BY MOUTH EVERY MORNING WITH BREAKFAST, Disp: 30 tablet, Rfl: 0 .  magnesium gluconate (MAGONATE) 500 MG tablet, Take 500 mg by mouth daily. , Disp: , Rfl:  .  metoprolol succinate (TOPROL-XL) 50 MG 24 hr tablet, Take 1 tablet (50 mg total) by mouth daily. Take with or immediately following a meal., Disp: 90 tablet, Rfl: 3 .  Multiple Vitamins-Minerals (CENTRUM SILVER)  tablet, Take 1 tablet by mouth daily., Disp: , Rfl:  .  Na Sulfate-K Sulfate-Mg Sulf 17.5-3.13-1.6 GM/177ML SOLN, Take as directed for colonoscopy prep., Disp: 354 mL, Rfl: 0 .  nitroGLYCERIN (NITROSTAT) 0.4 MG SL tablet, 1 tablet under tongue at onset of chest pain; you may repeat every 5 minutes for up to 3 doses., Disp: 25  tablet, Rfl: 3 .  ondansetron (ZOFRAN ODT) 4 MG disintegrating tablet, Take 1 tablet (4 mg total) by mouth every 8 (eight) hours as needed for nausea or vomiting., Disp: 20 tablet, Rfl: 0 .  pantoprazole (PROTONIX) 40 MG tablet, TAKE ONE TABLET BY MOUTH DAILY, Disp: 30 tablet, Rfl: 0 .  Prasterone (INTRAROSA) 6.5 MG INST, Place 1 suppository vaginally daily., Disp: 28 each, Rfl: 12 .  pravastatin (PRAVACHOL) 40 MG tablet, TAKE ONE TABLET BY MOUTH EVERY NIGHT AT BEDTIME, Disp: 30 tablet, Rfl: 1 .  sertraline (ZOLOFT) 50 MG tablet, TAKE ONE TABLET BY MOUTH DAILY, Disp: 30 tablet, Rfl: 0  Also, is allergic to pseudoeph-doxylamine-dm-apap; gluten; and penicillins..  Review of Systems  Constitutional: Negative for chills, fever and malaise/fatigue.  HENT: Negative for congestion, sinus pain and sore throat.   Eyes: Negative for blurred vision and pain.  Respiratory: Negative for cough and wheezing.   Cardiovascular: Negative for chest pain and leg swelling.  Gastrointestinal: Negative for abdominal pain, constipation, diarrhea, heartburn, nausea and vomiting.  Genitourinary: Negative for dysuria, frequency, hematuria and urgency.  Musculoskeletal: Negative for back pain, joint pain, myalgias and neck pain.  Skin: Negative for itching and rash.  Neurological: Negative for dizziness, tremors and weakness.  Endo/Heme/Allergies: Does not bruise/bleed easily.  Psychiatric/Behavioral: Negative for depression. The patient is not nervous/anxious and does not have insomnia.       Observations/Objective: No exam today, due to telephone eVisit due to [facility] virus restriction on elective visits and procedures.  Prior visits reviewed along with ultrasounds/labs as indicated.  Assessment and Plan: 1. Vaginal atrophy Intrarosa daily Encouraged for daily use, not to skip days  2. Lichen sclerosus Clobetasol prn  3. Due for MM[facility] next month but also when able to due to [place] restrictions  Follow Up  Instructions: Labs per PCP MMG when able Annual here when able   I discussed the assessment and treatment plan with the patient. The patient was provided an opportunity to ask questions and all were answered. The patient agreed with the plan and demonstrated an understanding of the instructions.   The patient was advised to call back or seek an in-person evaluation if the symptoms worsen or if the condition fails to improve as anticipated.  I provided 14 minutes of non-face-to-face time during this encounter.   Hoyt Koch, MD [facility], [facility] 09/17/2018  4:09 PM

## 2018-09-17 NOTE — Telephone Encounter (Signed)
Called and left voice mail for patient to call back to be schedule °

## 2018-09-17 NOTE — Telephone Encounter (Signed)
Routing message to nurse to sign off

## 2018-09-20 ENCOUNTER — Other Ambulatory Visit: Payer: Self-pay | Admitting: Family Medicine

## 2018-09-20 NOTE — Telephone Encounter (Signed)
Robin,   Please try and schedule patient a fasting drive up lab appointment and a WebEx for Brandi Mahoney with Dr. Lorelei Pont.

## 2018-09-20 NOTE — Telephone Encounter (Signed)
We have scheduled [place] fasting lab and WebEx appointments for next week.  Refill now or wait until appointment.  Last appointment and labs were 05/23/2017.

## 2018-09-20 NOTE — Telephone Encounter (Signed)
Labs 4/6  WEB EX 4/9

## 2018-09-22 NOTE — Telephone Encounter (Signed)
I am going to wait and see what her labs look like in a few days

## 2018-09-23 ENCOUNTER — Other Ambulatory Visit: Payer: Self-pay

## 2018-09-23 ENCOUNTER — Other Ambulatory Visit (INDEPENDENT_AMBULATORY_CARE_PROVIDER_SITE_OTHER): Payer: Self-pay

## 2018-09-23 DIAGNOSIS — E038 Other specified hypothyroidism: Secondary | ICD-10-CM

## 2018-09-23 DIAGNOSIS — Z79899 Other long term (current) drug therapy: Secondary | ICD-10-CM

## 2018-09-23 DIAGNOSIS — E785 Hyperlipidemia, unspecified: Secondary | ICD-10-CM

## 2018-09-23 DIAGNOSIS — R7303 Prediabetes: Secondary | ICD-10-CM

## 2018-09-23 LAB — LIPID PANEL
Cholesterol: 138 mg/dL (ref 0–200)
HDL: 38.2 mg/dL — ABNORMAL LOW (ref >39.00)
LDL Cholesterol: 62 mg/dL (ref 0–99)
NonHDL: 99.72
Total CHOL/HDL Ratio: 4
Triglycerides: 188 mg/dL — ABNORMAL HIGH (ref 0.0–149.0)
VLDL: 37.6 mg/dL (ref 0.0–40.0)

## 2018-09-23 LAB — CBC WITH DIFFERENTIAL/PLATELET
Basophils Absolute: 0 10*3/uL (ref 0.0–0.1)
Basophils Relative: 0.3 % (ref 0.0–3.0)
Eosinophils Absolute: 0.2 10*3/uL (ref 0.0–0.7)
Eosinophils Relative: 2.2 % (ref 0.0–5.0)
HCT: 40.2 % (ref 36.0–46.0)
Hemoglobin: 13 g/dL (ref 12.0–15.0)
Lymphocytes Relative: 34.6 % (ref 12.0–46.0)
Lymphs Abs: 2.8 10*3/uL (ref 0.7–4.0)
MCHC: 32.4 g/dL (ref 30.0–36.0)
MCV: 82.1 fl (ref 78.0–100.0)
Monocytes Absolute: 0.6 10*3/uL (ref 0.1–1.0)
Monocytes Relative: 7.4 % (ref 3.0–12.0)
Neutro Abs: 4.5 10*3/uL (ref 1.4–7.7)
Neutrophils Relative %: 55.5 % (ref 43.0–77.0)
Platelets: 250 10*3/uL (ref 150.0–400.0)
RBC: 4.9 Mil/uL (ref 3.87–5.11)
RDW: 13.9 % (ref 11.5–15.5)
WBC: 8.2 10*3/uL (ref 4.0–10.5)

## 2018-09-23 LAB — BASIC METABOLIC PANEL
BUN: 15 mg/dL (ref 6–23)
CO2: 31 mEq/L (ref 19–32)
Calcium: 9.6 mg/dL (ref 8.4–10.5)
Chloride: 102 mEq/L (ref 96–112)
Creatinine, Ser: 0.71 mg/dL (ref 0.40–1.20)
GFR: 83.32 mL/min (ref >60.00)
Glucose, Bld: 110 mg/dL — ABNORMAL HIGH (ref 70–99)
Potassium: 4.3 mEq/L (ref 3.5–5.1)
Sodium: 140 mEq/L (ref 135–145)

## 2018-09-23 LAB — HEPATIC FUNCTION PANEL
ALT: 24 U/L (ref 0–35)
AST: 21 U/L (ref 0–37)
Albumin: 4.7 g/dL (ref 3.5–5.2)
Alkaline Phosphatase: 59 U/L (ref 39–117)
Bilirubin, Direct: 0.1 mg/dL (ref 0.0–0.3)
Total Bilirubin: 0.5 mg/dL (ref 0.2–1.2)
Total Protein: 6.7 g/dL (ref 6.0–8.3)

## 2018-09-23 LAB — TSH: TSH: 4.45 u[IU]/mL (ref 0.35–4.50)

## 2018-09-23 LAB — HEMOGLOBIN A1C: Hgb A1c MFr Bld: 6.6 % — ABNORMAL HIGH (ref 4.6–6.5)

## 2018-09-23 LAB — T4, FREE: Free T4: 0.92 ng/dL (ref 0.60–1.60)

## 2018-09-23 LAB — T3, FREE: T3, Free: 3.1 pg/mL (ref 2.3–4.2)

## 2018-09-23 NOTE — Telephone Encounter (Signed)
Lab collected today.

## 2018-09-26 ENCOUNTER — Encounter: Payer: Self-pay | Admitting: Family Medicine

## 2018-09-26 ENCOUNTER — Ambulatory Visit (INDEPENDENT_AMBULATORY_CARE_PROVIDER_SITE_OTHER): Payer: BLUE CROSS/BLUE SHIELD | Admitting: Family Medicine

## 2018-09-26 VITALS — BP 147/78 | HR 80 | Temp 98.7°F | Ht 64.0 in | Wt 163.0 lb

## 2018-09-26 DIAGNOSIS — E781 Pure hyperglyceridemia: Secondary | ICD-10-CM | POA: Diagnosis not present

## 2018-09-26 DIAGNOSIS — G4733 Obstructive sleep apnea (adult) (pediatric): Secondary | ICD-10-CM | POA: Diagnosis not present

## 2018-09-26 DIAGNOSIS — I25119 Atherosclerotic heart disease of native coronary artery with unspecified angina pectoris: Secondary | ICD-10-CM

## 2018-09-26 DIAGNOSIS — E782 Mixed hyperlipidemia: Secondary | ICD-10-CM

## 2018-09-26 DIAGNOSIS — E119 Type 2 diabetes mellitus without complications: Secondary | ICD-10-CM

## 2018-09-26 DIAGNOSIS — E039 Hypothyroidism, unspecified: Secondary | ICD-10-CM | POA: Diagnosis not present

## 2018-09-26 MED ORDER — PRAVASTATIN SODIUM 40 MG PO TABS: 40.0000 mg | ORAL_TABLET | Freq: Every day | ORAL | 3 refills | Status: DC

## 2018-09-26 MED ORDER — GABAPENTIN 300 MG PO CAPS: 300.0000 mg | ORAL_CAPSULE | Freq: Every day | ORAL | 3 refills | Status: DC

## 2018-09-26 MED ORDER — SERTRALINE HCL 50 MG PO TABS: 50.0000 mg | ORAL_TABLET | Freq: Every day | ORAL | 3 refills | Status: DC

## 2018-09-26 MED ORDER — FENOFIBRATE 160 MG PO TABS: 160.0000 mg | ORAL_TABLET | Freq: Every day | ORAL | 3 refills | Status: DC

## 2018-09-26 MED ORDER — METOPROLOL SUCCINATE ER 50 MG PO TB24: 50.0000 mg | ORAL_TABLET | Freq: Every day | ORAL | 3 refills | Status: DC

## 2018-09-26 MED ORDER — LEVOTHYROXINE SODIUM 75 MCG PO TABS: ORAL_TABLET | ORAL | 3 refills | Status: DC

## 2018-09-26 MED ORDER — PANTOPRAZOLE SODIUM 40 MG PO TBEC: 40.0000 mg | DELAYED_RELEASE_TABLET | Freq: Every day | ORAL | 3 refills | Status: DC

## 2018-09-26 NOTE — Progress Notes (Signed)
Breauna Mazzeo T. Oluwadara Gorman, MD [facility] at [facility] [place], [address] Phone: [phone]  FAX: [phone]  Brandi Mahoney - 63 y.o. female  MRN [id number]  Date of Birth: [birth year]  Visit Date: 09/26/2018  PCP: Owens Loffler, MD  Referred by: Owens Loffler, MD  Virtual Visit via Video Note:  I connected with  Xylia Scherger Nunnelley on 09/26/2018 11:20 AM EDT by a video enabled telemedicine application and verified that I am speaking with the correct person using two identifiers.   Location patient: home computer, tablet, or smartphone Location provider: work or home office Consent: Verbal consent directly obtained from [name] Persons participating in the virtual visit: patient, provider  I discussed the limitations of evaluation and management by telemedicine and the availability of in person appointments. The patient expressed understanding and agreed to proceed.  History of Present Illness:  F/u video multiple medical problems.   Diabetes Mellitus: Tolerating Medications: diet controlled Compliance with diet: fair, Body mass index is 27.98 kg/m. Exercise: minimal / intermittent Avg blood sugars at home: not checking Foot problems: none Hypoglycemia: none No nausea, vomitting, blurred vision, polyuria.  Lab Results  Component Value Date   HGBA1C 6.6 (H) 09/23/2018   HGBA1C 6.5 05/02/2016   HGBA1C 6.8 (H) 03/12/2013   Lab Results  Component Value Date   LDLCALC 62 09/23/2018   CREATININE 0.71 09/23/2018    Wt Readings from Last 3 Encounters:  09/26/18 163 lb (73.9 kg)  01/02/18 165 lb (74.8 kg)  12/12/17 165 lb 6 oz (75 kg)    Lipids: Doing well, stable. Tolerating meds fine with no SE. Panel reviewed with patient.  Lipids:    Component Value Date/Time   CHOL 138 09/23/2018 1119   CHOL 151 10/17/2011 0826   TRIG 188.0 (H) 09/23/2018 1119   TRIG 136 10/17/2011 0826    HDL 38.20 (L) 09/23/2018 1119   HDL 44 10/17/2011 0826   LDLDIRECT 78.0 05/23/2017 0929   VLDL 37.6 09/23/2018 1119   VLDL 27 10/17/2011 0826   CHOLHDL 4 09/23/2018 1119    Lab Results  Component Value Date   ALT 24 09/23/2018   AST 21 09/23/2018   ALKPHOS 59 09/23/2018   BILITOT 0.5 09/23/2018    Thyroid: No symptoms. Labs reviewed. Denies cold / heat intolerance, dry skin, hair loss. No goiter.  Lab Results  Component Value Date   TSH 4.45 09/23/2018    Overall doing well.  Using CPAP more now and feeling better.   Review of Systems as above: See pertinent positives and pertinent negatives per HPI No acute distress verbally  Past Medical History, Surgical History, Social History, Family History, Problem List, Medications, and Allergies have been reviewed and updated if relevant.   Observations/Objective/Exam:  An attempt was made to discern vital signs over the phone and per patient if applicable and possible.   General:    Alert, Oriented, appears well and in no acute distress HEENT:     Atraumatic, conjunctiva clear, no obvious abnormalities on inspection of external nose and ears.  Neck:    Normal movements of the head and neck Pulmonary:     On inspection no signs of respiratory distress, breathing rate appears normal, no obvious gross SOB, gasping or wheezing Cardiovascular:    No obvious cyanosis Musculoskeletal:    Moves all visible extremities without noticeable abnormality Psych / Neurological:     Pleasant and cooperative,  no obvious depression or anxiety, speech and thought processing grossly intact  Results for orders placed or performed in visit on 09/23/18  Lipid panel  Result Value Ref Range   Cholesterol 138 0 - 200 mg/dL   Triglycerides 188.0 (H) 0.0 - 149.0 mg/dL   HDL 38.20 (L) >39.00 mg/dL   VLDL 37.6 0.0 - 40.0 mg/dL   LDL Cholesterol 62 0 - 99 mg/dL   Total CHOL/HDL Ratio 4    NonHDL 99.72   Hemoglobin A1c  Result Value Ref Range    Hgb A1c MFr Bld 6.6 (H) 4.6 - 6.5 %  CBC with Differential/Platelet  Result Value Ref Range   WBC 8.2 4.0 - 10.5 K/uL   RBC 4.90 3.87 - 5.11 Mil/uL   Hemoglobin 13.0 12.0 - 15.0 g/dL   HCT 40.2 36.0 - 46.0 %   MCV 82.1 78.0 - 100.0 fl   MCHC 32.4 30.0 - 36.0 g/dL   RDW 13.9 11.5 - 15.5 %   Platelets 250.0 150.0 - 400.0 K/uL   Neutrophils Relative % 55.5 43.0 - 77.0 %   Lymphocytes Relative 34.6 12.0 - 46.0 %   Monocytes Relative 7.4 3.0 - 12.0 %   Eosinophils Relative 2.2 0.0 - 5.0 %   Basophils Relative 0.3 0.0 - 3.0 %   Neutro Abs 4.5 1.4 - 7.7 K/uL   Lymphs Abs 2.8 0.7 - 4.0 K/uL   Monocytes Absolute 0.6 0.1 - 1.0 K/uL   Eosinophils Absolute 0.2 0.0 - 0.7 K/uL   Basophils Absolute 0.0 0.0 - 0.1 K/uL  Basic metabolic panel  Result Value Ref Range   Sodium 140 135 - 145 mEq/L   Potassium 4.3 3.5 - 5.1 mEq/L   Chloride 102 96 - 112 mEq/L   CO2 31 19 - 32 mEq/L   Glucose, Bld 110 (H) 70 - 99 mg/dL   BUN 15 6 - 23 mg/dL   Creatinine, Ser 0.71 0.40 - 1.20 mg/dL   Calcium 9.6 8.4 - 10.5 mg/dL   GFR 83.32 >60.00 mL/min  Hepatic function panel  Result Value Ref Range   Total Bilirubin 0.5 0.2 - 1.2 mg/dL   Bilirubin, Direct 0.1 0.0 - 0.3 mg/dL   Alkaline Phosphatase 59 39 - 117 U/L   AST 21 0 - 37 U/L   ALT 24 0 - 35 U/L   Total Protein 6.7 6.0 - 8.3 g/dL   Albumin 4.7 3.5 - 5.2 g/dL  TSH  Result Value Ref Range   TSH 4.45 0.35 - 4.50 uIU/mL  T3, free  Result Value Ref Range   T3, Free 3.1 2.3 - 4.2 pg/mL  T4, free  Result Value Ref Range   Free T4 0.92 0.60 - 1.60 ng/dL     Assessment and Plan:  Mixed hyperlipidemia  Obstructive sleep apnea - Plan: gabapentin (NEURONTIN) 300 MG capsule  HYPERTRIGLYCERIDEMIA  Hypothyroidism in adult  Coronary artery disease involving native coronary artery of native heart with angina pectoris (HCC)  Type 2 diabetes, diet controlled (HCC)  Conditions are all stable - she does need refills on essentially all of her  medications.  I discussed the assessment and treatment plan with the patient. The patient was provided an opportunity to ask questions and all were answered. The patient agreed with the plan and demonstrated an understanding of the instructions.   The patient was advised to call back or seek an in-person evaluation if the symptoms worsen or if the condition fails to improve as anticipated.  Follow-up:  prn unless noted otherwise below No follow-ups on file.  Meds ordered this encounter  Medications  . levothyroxine (SYNTHROID, LEVOTHROID) 75 MCG tablet    Sig: TAKE ONE TABLET BY MOUTH EVERY MORNING WITH BREAKFAST    Dispense:  90 tablet    Refill:  3  . pravastatin (PRAVACHOL) 40 MG tablet    Sig: Take 1 tablet (40 mg total) by mouth at bedtime.    Dispense:  90 tablet    Refill:  3  . pantoprazole (PROTONIX) 40 MG tablet    Sig: Take 1 tablet (40 mg total) by mouth daily.    Dispense:  90 tablet    Refill:  3  . gabapentin (NEURONTIN) 300 MG capsule    Sig: Take 1 capsule (300 mg total) by mouth at bedtime.    Dispense:  90 capsule    Refill:  3  . fenofibrate 160 MG tablet    Sig: Take 1 tablet (160 mg total) by mouth daily.    Dispense:  90 tablet    Refill:  3  . metoprolol succinate (TOPROL-XL) 50 MG 24 hr tablet    Sig: Take 1 tablet (50 mg total) by mouth daily. Take with or immediately following a meal.    Dispense:  90 tablet    Refill:  3  . sertraline (ZOLOFT) 50 MG tablet    Sig: Take 1 tablet (50 mg total) by mouth daily.    Dispense:  90 tablet    Refill:  3   No orders of the defined types were placed in this encounter.   Signed,  Maud Deed. Shalandra Leu, MD

## 2018-10-08 ENCOUNTER — Other Ambulatory Visit: Payer: Self-pay | Admitting: Physician Assistant

## 2018-10-08 NOTE — Telephone Encounter (Signed)
Patient is requesting refill on diltiazem/lidocaine cream. His anal fissure. Had colonoscopy 12/2017. Do you want me to give 1 time refill?

## 2018-11-01 ENCOUNTER — Other Ambulatory Visit: Payer: Self-pay

## 2018-11-01 ENCOUNTER — Encounter: Payer: Self-pay | Admitting: Emergency Medicine

## 2018-11-01 DIAGNOSIS — R079 Chest pain, unspecified: Secondary | ICD-10-CM | POA: Insufficient documentation

## 2018-11-01 DIAGNOSIS — Z87891 Personal history of nicotine dependence: Secondary | ICD-10-CM | POA: Diagnosis not present

## 2018-11-01 DIAGNOSIS — Z79899 Other long term (current) drug therapy: Secondary | ICD-10-CM | POA: Insufficient documentation

## 2018-11-01 DIAGNOSIS — Z7982 Long term (current) use of aspirin: Secondary | ICD-10-CM | POA: Diagnosis not present

## 2018-11-01 DIAGNOSIS — I2510 Atherosclerotic heart disease of native coronary artery without angina pectoris: Secondary | ICD-10-CM | POA: Diagnosis not present

## 2018-11-01 DIAGNOSIS — M79602 Pain in left arm: Secondary | ICD-10-CM | POA: Diagnosis present

## 2018-11-01 MED ORDER — SODIUM CHLORIDE 0.9% FLUSH: 3.0000 mL | Freq: Once | INTRAVENOUS | Status: DC

## 2018-11-01 NOTE — ED Triage Notes (Signed)
Pt ambulatory to triage with steady gait. Reports having left arm pain since around 2000 this evening that has worsened and now radiated into her left shoulder and jaw and is now moving across her chest. Denies sob. No increased work of breathing or distress noted at this time. Skin warm and dry.

## 2018-11-02 ENCOUNTER — Emergency Department: Payer: BLUE CROSS/BLUE SHIELD

## 2018-11-02 ENCOUNTER — Emergency Department
Admission: EM | Admit: 2018-11-02 | Discharge: 2018-11-02 | Disposition: A | Discharge status: Home / Self Care | Payer: BLUE CROSS/BLUE SHIELD | Attending: Emergency Medicine | Admitting: Emergency Medicine

## 2018-11-02 DIAGNOSIS — R079 Chest pain, unspecified: Secondary | ICD-10-CM

## 2018-11-02 LAB — CBC
HCT: 38.2 % (ref 36.0–46.0)
Hemoglobin: 12.4 g/dL (ref 12.0–15.0)
MCH: 26.4 pg (ref 26.0–34.0)
MCHC: 32.5 g/dL (ref 30.0–36.0)
MCV: 81.4 fL (ref 80.0–100.0)
Platelets: 287 10*3/uL (ref 150–400)
RBC: 4.69 MIL/uL (ref 3.87–5.11)
RDW: 14.1 % (ref 11.5–15.5)
WBC: 9.4 10*3/uL (ref 4.0–10.5)
nRBC: 0 % (ref 0.0–0.2)

## 2018-11-02 LAB — TROPONIN I
Troponin I: <0.03 ng/mL (ref <0.03)
Troponin I: <0.03 ng/mL (ref <0.03)

## 2018-11-02 LAB — BASIC METABOLIC PANEL
Anion gap: 8 (ref 5–15)
BUN: 17 mg/dL (ref 8–23)
CO2: 28 mmol/L (ref 22–32)
Calcium: 9.3 mg/dL (ref 8.9–10.3)
Chloride: 103 mmol/L (ref 98–111)
Creatinine, Ser: 0.8 mg/dL (ref 0.44–1.00)
GFR calc Af Amer: >60 mL/min (ref >60)
GFR calc non Af Amer: >60 mL/min (ref >60)
Glucose, Bld: 131 mg/dL — ABNORMAL HIGH (ref 70–99)
Potassium: 3.6 mmol/L (ref 3.5–5.1)
Sodium: 139 mmol/L (ref 135–145)

## 2018-11-02 LAB — FIBRIN DERIVATIVES D-DIMER (ARMC ONLY): Fibrin derivatives D-dimer (ARMC): 348.94 ng/mL (FEU) (ref 0.00–499.00)

## 2018-11-02 IMAGING — CR CHEST - 2 VIEW
2 series · 2 of 2 positions shown · non-contrast
Comparison: Radiographs and CT [DATE]

CLINICAL DATA: Chest pain. Pain radiates to shoulder and jaw.

EXAM:
CHEST - 2 VIEW

[chest pa]
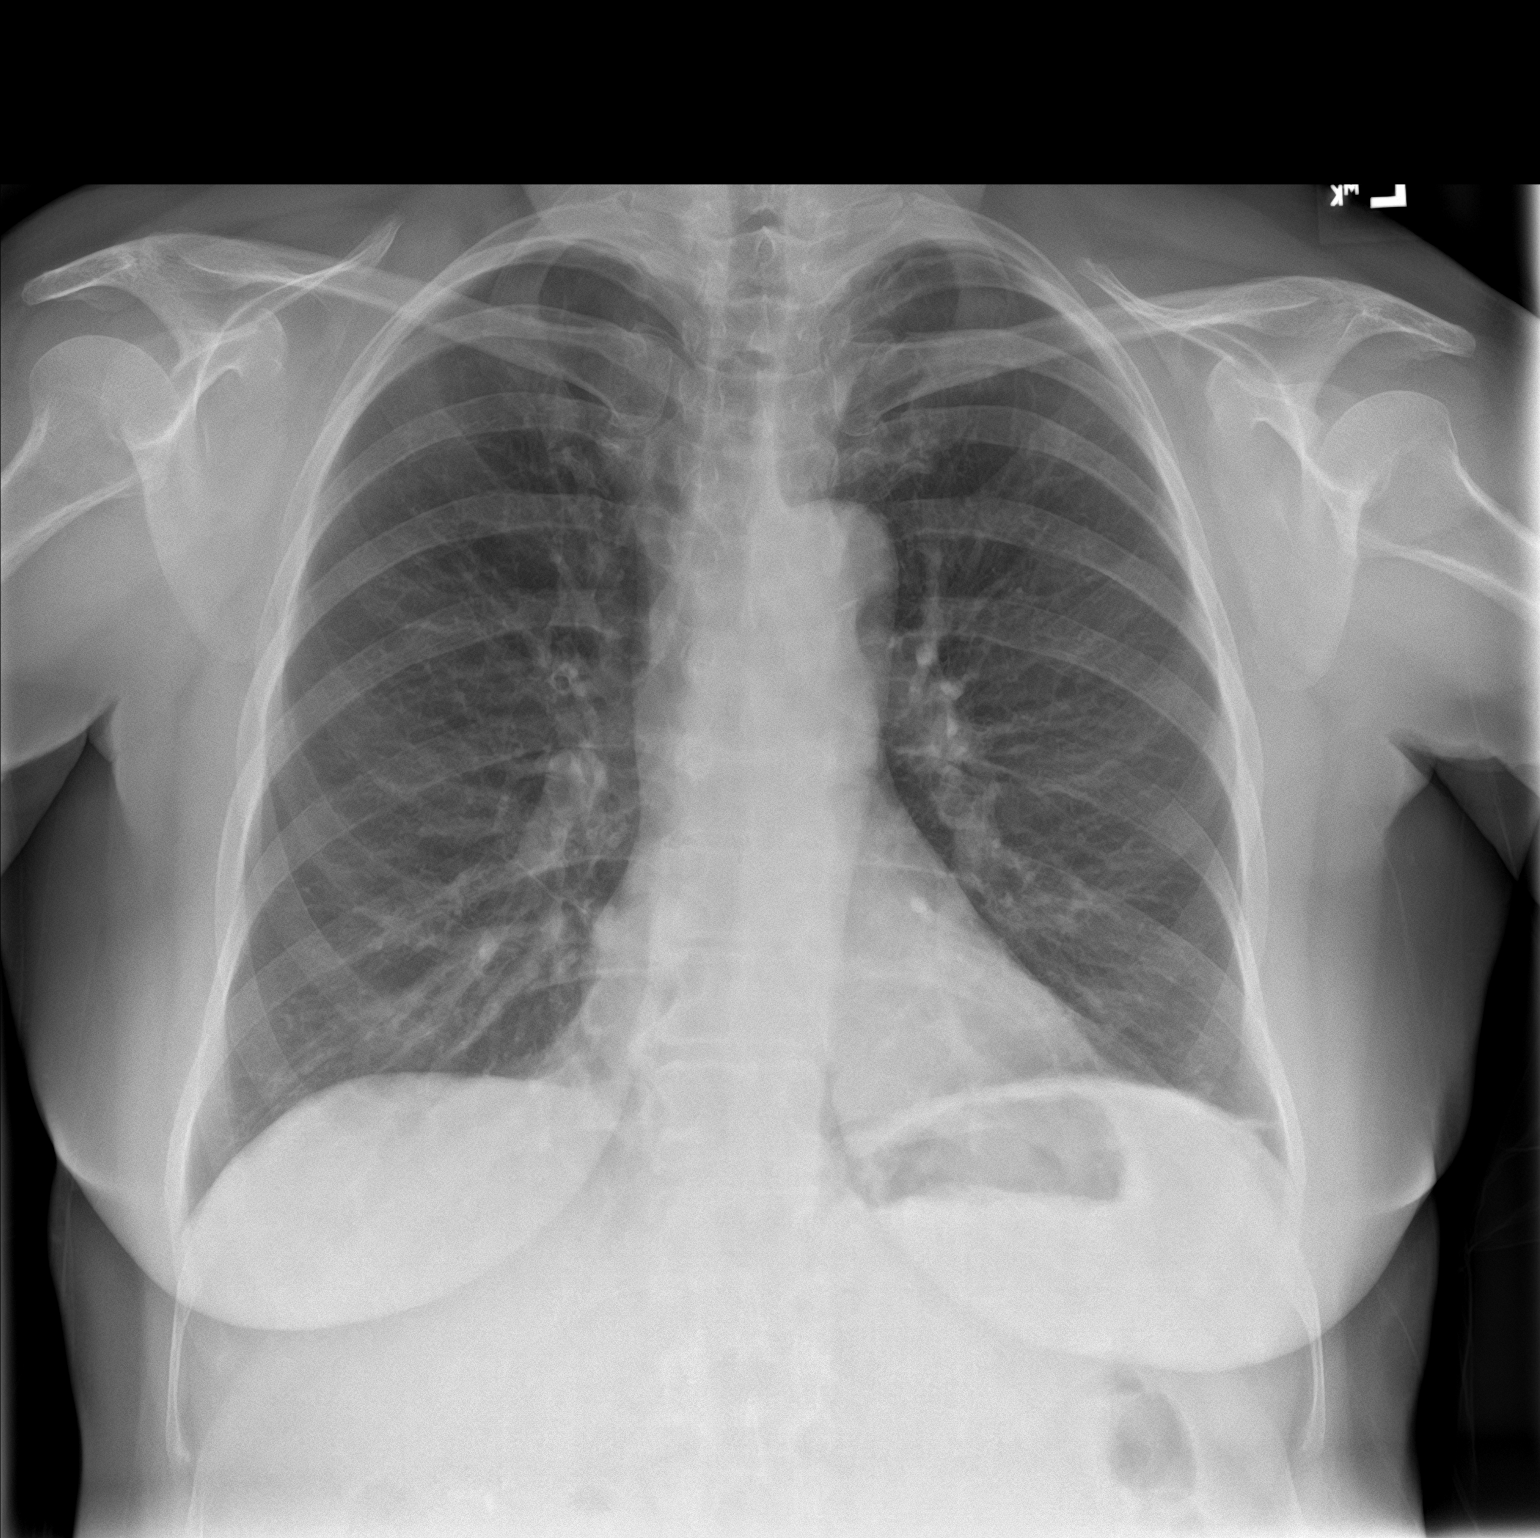

[chest lat]
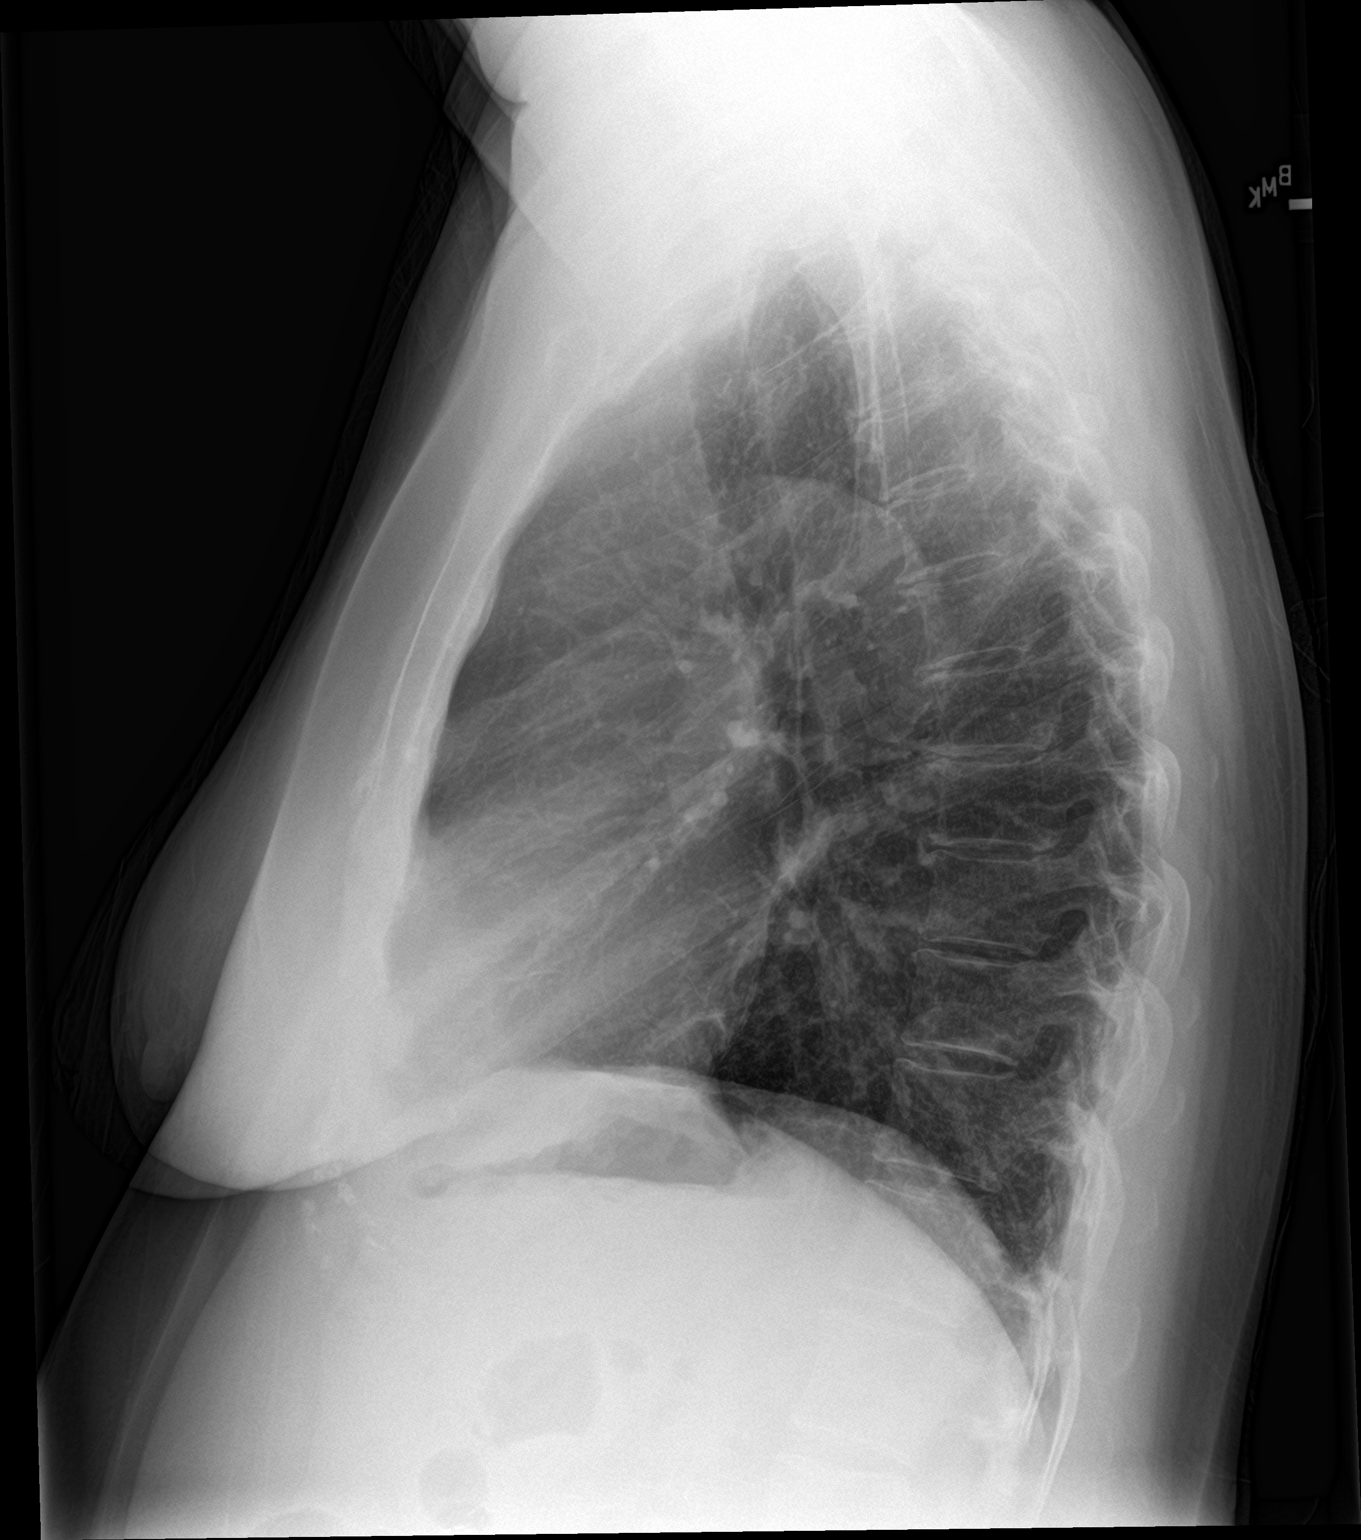

[2 of 2 positions shown; findings below may reference images not displayed]

FINDINGS: The cardiomediastinal contours are normal. Minimal subsegmental
atelectasis or scarring at the left lung base. Pulmonary
vasculature is normal. No consolidation, pleural effusion, or
pneumothorax. No acute osseous abnormalities are seen.
IMPRESSION: No acute chest findings.

## 2018-11-02 NOTE — ED Notes (Signed)
Pt to xray

## 2018-11-02 NOTE — ED Provider Notes (Signed)
[facility] Emergency Department Provider Note    First MD Initiated Contact with Patient 11/02/18 0038     (approximate)  I have reviewed the triage vital signs and the nursing notes.   HISTORY  Chief Complaint Chest Pain    HPI Brandi Mahoney is a 63 y.o. female with below list of previous medical conditions including CAD, viral myocarditis last catheterization 2011 followed by Dr. Rockey Situ presents to the emergency department with acute onset of left arm pain with radiation into the left neck that began at 8:00 PM last night.  Patient states that the pain now moves across the upper portion of the chest however patient denies any dyspnea.  Patient denies any diaphoresis.  Patient denies any lower extremity pain or swelling.  Patient denies any cough.  Patient denies any worsening pain with movement of the left arm.        Past Medical History:  Diagnosis Date  . Asthma    does not use an inhaler.  Dx over 20 yrsago.  . Blood transfusion without reported diagnosis    2016 Kidney stone removal  . CAD (coronary artery disease)    a. 01/2010 Cath: mild atherosclerosis in the mid LCX after takeoff of OM1, mild diff LAD dzs;  b. 07/2013 MV: EF 67%, no ischemia.  . Depression   . Heart murmur   . Hemorrhoids   . History of pneumonia 1969  . HLD (hyperlipidemia)   . Hypothyroidism   . IBS (irritable bowel syndrome)   . Migraine headache   . Myocarditis ([place])    a. 01/2010 presented w/ c/p and elev trop-->Cath: mild atherosclerosis in the mid LCX after takeoff of OM1, mild diff LAD dzs;  b. 03/2010 Echo: EF 60-65%, DD, mild LVH;  c. 07/2011 Echo: EF 55-60%, mild LVH, no rwma.  . Prediabetes   . Sleep apnea    has c-pap.  wears off and on - not lately 01-01-18 per reported    Patient Active Problem List   Diagnosis Date Noted  . Lichen sclerosus 62/08/5595  . Coronary artery disease involving native coronary artery of native heart with angina pectoris  ([place])   . Type 2 diabetes, diet controlled ([place])   . Anxiety   . Obstructive sleep apnea 09/25/2013  . 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 IBS (irritable bowel syndrome) 09/26/2012  . PARESTHESIA, HANDS 06/01/2010  . Mixed hyperlipidemia 04/01/2010  . 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 HYPERTRIGLYCERIDEMIA 07/21/2009  . Hypothyroidism in adult 09/08/2008  . Major depressive disorder, recurrent episode, in full remission ([place]) 09/08/2008  . ASTHMA 09/08/2008    Past Surgical History:  Procedure Laterality Date  . APPENDECTOMY  1974  . BREAST EXCISIONAL BIOPSY Right 1988   neg  . CARDIAC CATHETERIZATION  8/11   no significant [facility] Dr [facility] Situ,  . CHOLECYSTECTOMY  2002  . COLONOSCOPY  2013  . CYST EXCISION    . NEPHROLITHOTOMY Right 08/20/2014   Procedure: RIGHT PERCUTANEOUS NEPHROLITHOTOMY ;  Surgeon: Malka So, MD;  Location: [facility];  Service: Urology;  Laterality: Right;  . POLYPECTOMY    . TOTAL ABDOMINAL HYSTERECTOMY W/ BILATERAL SALPINGOOPHORECTOMY  2000   no ovaries, uterus, or cervix.    Prior to Admission medications   Medication Sig Start Date End Date Taking? Authorizing Provider  aspirin EC 81 MG tablet Take 81 mg by mouth daily.    [provider]  clobetasol cream (TEMOVATE) 0.05 % APPLY TWO TIMES A  DAY FOR 2 WEEKS THEN USE AS NEEDED FOR LICHEN SCLEROSIS 6/83/41   Gae Dry, MD  cyclobenzaprine (FLEXERIL) 10 MG tablet TAKE 1 TABLET (10 MG TOTAL) BY MOUTH 2 (TWO) TIMES DAILY AS NEEDED (PAIN). 10/16/16   [provider]  EMGALITY 120 MG/ML SOAJ 120 mg every 30 (thirty) days.  08/14/18   [provider]  fenofibrate 160 MG tablet Take 1 tablet (160 mg total) by mouth daily. 09/26/18   Copland, Frederico Hamman, MD  gabapentin (NEURONTIN) 300 MG capsule Take 1 capsule (300 mg total) by mouth at bedtime. 09/26/18   Copland, Frederico Hamman, MD  glucose blood (ACCU-CHEK AVIVA) test strip Use to check blood sugar once daily 03/02/16   Copland,  Frederico Hamman, MD  levothyroxine (SYNTHROID, LEVOTHROID) 75 MCG tablet TAKE ONE TABLET BY MOUTH EVERY MORNING WITH BREAKFAST 09/26/18   Copland, Frederico Hamman, MD  lidocaine (XYLOCAINE) 5 % ointment APPLY 1/2 TO 1 INCH INSIDE ANUS 3 TO 4 TIMES A DAY. 10/08/18   Ladene Artist, MD  magnesium gluconate (MAGONATE) 500 MG tablet Take 500 mg by mouth daily.     [provider]  metoprolol succinate (TOPROL-XL) 50 MG 24 hr tablet Take 1 tablet (50 mg total) by mouth daily. Take with or immediately following a meal. 09/26/18   Copland, Frederico Hamman, MD  Multiple Vitamins-Minerals (ONE-A-DAY WOMENS PETITES PO) Take 2 tablets by mouth daily.    [provider]  nitroGLYCERIN (NITROSTAT) 0.4 MG SL tablet 1 tablet under tongue at onset of chest pain; you may repeat every 5 minutes for up to 3 doses. 06/28/16   Minna Merritts, MD  ondansetron (ZOFRAN ODT) 4 MG disintegrating tablet Take 1 tablet (4 mg total) by mouth every 8 (eight) hours as needed for nausea or vomiting. 07/24/17   Rosemarie Ax, MD  pantoprazole (PROTONIX) 40 MG tablet Take 1 tablet (40 mg total) by mouth daily. 09/26/18   Copland, Frederico Hamman, MD  Prasterone (INTRAROSA) 6.5 MG INST Place 1 suppository vaginally daily. 09/17/18   Gae Dry, MD  pravastatin (PRAVACHOL) 40 MG tablet Take 1 tablet (40 mg total) by mouth at bedtime. 09/26/18   Copland, Frederico Hamman, MD  sertraline (ZOLOFT) 50 MG tablet Take 1 tablet (50 mg total) by mouth daily. 09/26/18   Copland, Frederico Hamman, MD    Allergies Pseudoeph-doxylamine-dm-apap; Gluten; and Penicillins  Family History  Problem Relation Age of Onset  . Coronary artery disease Mother   . Diabetes Mother   . ALS Mother   . Hyperlipidemia Father   . Breast cancer Paternal Grandmother   . Lung cancer Paternal Grandfather        CAD  . Other Daughter        gluten  . Down syndrome Son   . Colon cancer Maternal Grandfather        dx early 59's   . Cancer Sister 40       breast  . Breast cancer Sister 68   . Esophageal cancer Neg Hx   . Liver cancer Neg Hx   . Pancreatic cancer Neg Hx   . Rectal cancer Neg Hx   . Stomach cancer Neg Hx     Social History Social History   Tobacco Use  . Smoking status: Former Smoker    Packs/day: 0.35    Years: 35.00    Pack years: 12.25    Types: Cigarettes    Last attempt to quit: 09/25/2008    Years since quitting: 10.1  . Smokeless tobacco: Never  Used  . Tobacco comment: quit in 2007-2008, social smoker  Substance Use Topics  . Alcohol use: Yes    Comment: rarely  . Drug use: No    Review of Systems Constitutional: No fever/chills Eyes: No visual changes. ENT: No sore throat. Cardiovascular: Denies chest pain. Respiratory: Denies shortness of breath. Gastrointestinal: No abdominal pain.  No nausea, no vomiting.  No diarrhea.  No constipation. Genitourinary: Negative for dysuria. Musculoskeletal: Negative for neck pain.  Negative for back pain.  Positive for left shoulder pain and neck discomfort Integumentary: Negative for rash. Neurological: Negative for headaches, focal weakness or numbness.   ____________________________________________   PHYSICAL EXAM:  VITAL SIGNS: ED Triage Vitals  Enc Vitals Group     BP 11/01/18 2354 (!) 142/76     Pulse Rate 11/01/18 2354 95     Resp 11/01/18 2354 18     Temp 11/01/18 2354 97.7 F (36.5 C)     Temp Source 11/01/18 2354 Oral     SpO2 11/01/18 2354 95 %     Weight 11/01/18 2356 74.8 kg (165 lb)     Height 11/01/18 2356 1.626 m (5\' 4" )     Head Circumference --      Peak Flow --      Pain Score 11/01/18 2355 7     Pain Loc --      Pain Edu? --      Excl. in [place]? --     Constitutional: Alert and oriented. Well appearing and in no acute distress. Eyes: Conjunctivae are normal.  Mouth/Throat: Mucous membranes are moist. Oropharynx non-erythematous. Neck: No stridor.  } Cardiovascular: Normal rate, regular rhythm. Good peripheral circulation. Grossly normal heart sounds.  Respiratory: Normal respiratory effort.  No retractions. No audible wheezing. Gastrointestinal: Soft and nontender. No distention.  Musculoskeletal: No lower extremity tenderness nor edema. No gross deformities of extremities. Neurologic:  Normal speech and language. No gross focal neurologic deficits are appreciated.  Skin:  Skin is warm, dry and intact. No rash noted. Psychiatric: Mood and affect are normal. Speech and behavior are normal.  ____________________________________________   LABS (all labs ordered are listed, but only abnormal results are displayed)  Labs Reviewed  BASIC METABOLIC PANEL - Abnormal; Notable for the following components:      Result Value   Glucose, Bld 131 (*)    All other components within normal limits  CBC  TROPONIN I  FIBRIN DERIVATIVES D-DIMER ([facility] ONLY)  TROPONIN I   ____________________________________________  EKG  ED ECG REPORT I, [place] N BROWN, the attending physician, personally viewed and interpreted this ECG.   Date: 11/01/2018  EKG Time: 1:52 PM  Rate: 87  Rhythm: Normal sinus rhythm  Axis: Normal  Intervals: Normal  ST&T Change: None  ____________________________________________  RADIOLOGY I, [place] N BROWN, personally viewed and evaluated these images (plain radiographs) as part of my medical decision making, as well as reviewing the written report by the radiologist.  ED MD interpretation: No acute chest findings on chest x-ray per radiologist.  Official radiology report(s): Dg Chest 2 View  Result Date: 11/02/2018 CLINICAL DATA:  Chest pain. Pain radiates to shoulder and jaw. EXAM: CHEST - 2 VIEW COMPARISON:  Radiographs and CT 06/22/2016 FINDINGS: The cardiomediastinal contours are normal. Minimal subsegmental atelectasis or scarring at the left lung base. Pulmonary vasculature is normal. No consolidation, pleural effusion, or pneumothorax. No acute osseous abnormalities are seen. IMPRESSION: No acute chest  findings. Electronically Signed   By: Aurther Loft.D.  On: 11/02/2018 00:52     Procedures   ____________________________________________   INITIAL IMPRESSION / MDM / ASSESSMENT AND PLAN / ED COURSE  As part of my medical decision making, I reviewed the following data within the [place] NUMBER   63 year old female presenting with above-stated history and physical exam secondary to left arm/left jaw discomfort.  Considered possibly of CAD including MI and a such EKG was performed revealed no evidence of ischemia or infarction.  In addition troponin x2 performed which were both negative.  Also considered possibility of pulmonary emboli and a low risk setting and as such d-dimer performed which was negative.  Patient is without discomfort at this time and as such will recommend outpatient follow-up with Dr. Michiel Sites for further outpatient cardiac evaluation.  *Brandi Mahoney was evaluated in Emergency Department on 11/02/2018 for the symptoms described in the history of present illness. She was evaluated in the context of the global COVID-19 pandemic, which necessitated consideration that the patient might be at risk for infection with the SARS-CoV-2 virus that causes COVID-19. Institutional protocols and algorithms that pertain to the evaluation of patients at risk for COVID-19 are in a state of rapid change based on information released by regulatory bodies including the CDC and federal and state organizations. These policies and algorithms were followed during the patient's care in the ED.*        ____________________________________________  FINAL CLINICAL IMPRESSION(S) / ED DIAGNOSES  Final diagnoses:  Chest pain, unspecified type     MEDICATIONS GIVEN DURING THIS VISIT:  Medications  sodium chloride flush (NS) 0.9 % injection 3 mL (has no administration in time range)     ED Discharge Orders    None       Note:  This document was prepared using  Dragon voice recognition software and may include unintentional dictation errors.   Gregor Hams, MD 11/02/18 [phone]

## 2018-11-04 ENCOUNTER — Telehealth: Payer: Self-pay | Admitting: Cardiovascular Disease

## 2018-11-04 NOTE — Progress Notes (Signed)
Virtual Visit via Video Note   This visit type was conducted due to national recommendations for restrictions regarding the COVID-19 Pandemic (e.g. social distancing) in an effort to limit this patient's exposure and mitigate transmission in our community.  Due to her co-morbid illnesses, this patient is at least at moderate risk for complications without adequate follow up.  This format is felt to be most appropriate for this patient at this time.  All issues noted in this document were discussed and addressed.  A limited physical exam was performed with this format.  Please refer to the patient's chart for her consent to telehealth for [facility].   I connected with  Brandi Mahoney on 11/05/18 by a video enabled telemedicine application and verified that I am speaking with the correct person using two identifiers. I discussed the limitations of evaluation and management by telemedicine. The patient expressed understanding and agreed to proceed.   Evaluation Performed:  Follow-up visit  Date:  11/05/2018   ID:  Brandi Mahoney, DOB Jun 13, 1956, MRN [id number]  Patient Location:  [place] [address]   Provider location:   [facility], [place] office  PCP:  Owens Loffler, MD  Cardiologist:  Patsy Baltimore   Chief Complaint:  Chronic pain, left shoulder pain   History of Present Illness:    Brandi Mahoney is a 63 y.o. female who presents via audio/video conferencing for a telehealth visit today.   The patient does not symptoms concerning for COVID-19 infection (fever, chills, cough, or new SHORTNESS OF BREATH).   Patient has a past medical history of hx of troponin elevation to 17,  cardiac catheterization August 2011 showing nonobstructive left circumflex disease, presentation felt to be secondary to a viral myocarditis with low normal systolic function by cath,  repeat admission to [facility] for chest discomfort  October 2011, felt to be noncardiac with echocardiogram normal systolic function  Admitted to hospital 06/23/2016 chest pain Who presents for shortness of breath, tachycardia  Chronic pain Worried about left shoulder pain, up to jaw Pain at rest Went to ER,   Very sedentary Fatigue quickly  CT scan in 2018,  No coronary calcification  On prior office visit she has had shortness of breath, poor energy, tachycardia, diffuse pain, shoulder, arm "everything hurts", neck issue  Stress at [facility] care of son who has Down's syndrome Previous hospitalizations for chest pain symptoms Work-up negative  Previous CT scan chest in the emergency room showing no PE No significant coronary calcification  Stress test as an inpatient showing no ischemia 2018 Results reviewed with her including the CT scan, images were pulled up in the office and discussed  Other past medical history reviewed On a prior office visit in 2015, she reported having chest pain in the center of her chest radiating to her left arm starting 2 days ago. General malaise felt queasy, lightheaded, had chest pain and left arm pain.   shortness of breath with exertion. She is worried about progression of her CAD. She went to the emergency room on the way home from work. Cardiac enzymes were negative x2 EKG essentially normal.   Given her chest pain a stress test was performed February 2015.  showed no ischemia. She had good exercise tolerance though on a treadmill did develop left chest and arm pain. She achieved target heart rate. EKG was essentially normal with no significant changes concerning for ischemia. No perfusion abnormality.  Cardiac catheterization January 20 2010: mild atherosclerosis in the mid left circumflex after the takeoff of the OM1, mild diffuse LAD disease repeat echocardiogram on March 19, 2010 showing ejection fraction 80-99%, diastolic dysfunction, mild LVH   Prior CV studies:   The  following studies were reviewed today:    Past Medical History:  Diagnosis Date  . Asthma    does not use an inhaler.  Dx over 20 yrsago.  . Blood transfusion without reported diagnosis    2016 Kidney stone removal  . CAD (coronary artery disease)    a. 01/2010 Cath: mild atherosclerosis in the mid LCX after takeoff of OM1, mild diff LAD dzs;  b. 07/2013 MV: EF 67%, no ischemia.  . Depression   . Heart murmur   . Hemorrhoids   . History of pneumonia 1969  . HLD (hyperlipidemia)   . Hypothyroidism   . IBS (irritable bowel syndrome)   . Migraine headache   . Myocarditis ([place])    a. 01/2010 presented w/ c/p and elev trop-->Cath: mild atherosclerosis in the mid LCX after takeoff of OM1, mild diff LAD dzs;  b. 03/2010 Echo: EF 60-65%, DD, mild LVH;  c. 07/2011 Echo: EF 55-60%, mild LVH, no rwma.  . Prediabetes   . Sleep apnea    has c-pap.  wears off and on - not lately 01-01-18 per reported   Past Surgical History:  Procedure Laterality Date  . APPENDECTOMY  1974  . BREAST EXCISIONAL BIOPSY Right 1988   neg  . CARDIAC CATHETERIZATION  8/11   no significant -[facility]- Dr Rockey Situ,  . CHOLECYSTECTOMY  2002  . COLONOSCOPY  2013  . CYST EXCISION    . NEPHROLITHOTOMY Right 08/20/2014   Procedure: RIGHT PERCUTANEOUS NEPHROLITHOTOMY ;  Surgeon: Malka So, MD;  Location: [facility];  Service: Urology;  Laterality: Right;  . POLYPECTOMY    . TOTAL ABDOMINAL HYSTERECTOMY W/ BILATERAL SALPINGOOPHORECTOMY  2000   no ovaries, uterus, or cervix.     Current Meds  Medication Sig  . aspirin EC 81 MG tablet Take 81 mg by mouth daily.  . clobetasol cream (TEMOVATE) 0.05 % APPLY TWO TIMES A DAY FOR 2 WEEKS THEN USE AS NEEDED FOR LICHEN SCLEROSIS  . cyclobenzaprine (FLEXERIL) 10 MG tablet TAKE 1 TABLET (10 MG TOTAL) BY MOUTH 2 (TWO) TIMES DAILY AS NEEDED (PAIN).  Marland Kitchen EMGALITY 120 MG/ML SOAJ 120 mg every 30 (thirty) days.   . fenofibrate 160 MG tablet Take 1 tablet (160 mg total) by mouth daily.  Marland Kitchen  gabapentin (NEURONTIN) 300 MG capsule Take 1 capsule (300 mg total) by mouth at bedtime.  Marland Kitchen glucose blood (ACCU-CHEK AVIVA) test strip Use to check blood sugar once daily  . levothyroxine (SYNTHROID, LEVOTHROID) 75 MCG tablet TAKE ONE TABLET BY MOUTH EVERY MORNING WITH BREAKFAST  . lidocaine (XYLOCAINE) 5 % ointment APPLY 1/2 TO 1 INCH INSIDE ANUS 3 TO 4 TIMES A DAY.  . magnesium gluconate (MAGONATE) 500 MG tablet Take 500 mg by mouth daily.   . metoprolol succinate (TOPROL-XL) 50 MG 24 hr tablet Take 1 tablet (50 mg total) by mouth daily. Take with or immediately following a meal.  . Multiple Vitamins-Minerals (ONE-A-DAY WOMENS PETITES PO) Take 2 tablets by mouth daily.  . nitroGLYCERIN (NITROSTAT) 0.4 MG SL tablet 1 tablet under tongue at onset of chest pain; you may repeat every 5 minutes for up to 3 doses.  Marland Kitchen ondansetron (ZOFRAN ODT) 4 MG disintegrating tablet Take 1 tablet (4 mg total)  by mouth every 8 (eight) hours as needed for nausea or vomiting.  . pantoprazole (PROTONIX) 40 MG tablet Take 1 tablet (40 mg total) by mouth daily.  . Prasterone (INTRAROSA) 6.5 MG INST Place 1 suppository vaginally daily.  . pravastatin (PRAVACHOL) 40 MG tablet Take 1 tablet (40 mg total) by mouth at bedtime.  . sertraline (ZOLOFT) 50 MG tablet Take 1 tablet (50 mg total) by mouth daily.   Current Facility-Administered Medications for the 11/05/18 encounter (Appointment) with Minna Merritts, MD  Medication  . 0.9 %  sodium chloride infusion     Allergies:   Pseudoeph-doxylamine-dm-apap; Gluten; and Penicillins   Social History   Tobacco Use  . Smoking status: Former Smoker    Packs/day: 0.35    Years: 35.00    Pack years: 12.25    Types: Cigarettes    Last attempt to quit: 09/25/2008    Years since quitting: 10.1  . Smokeless tobacco: Never Used  . Tobacco comment: quit in 2007-2008, social smoker  Substance Use Topics  . Alcohol use: Yes    Comment: rarely  . Drug use: No     Current  Outpatient Medications on File Prior to Visit  Medication Sig Dispense Refill  . aspirin EC 81 MG tablet Take 81 mg by mouth daily.    . clobetasol cream (TEMOVATE) 0.05 % APPLY TWO TIMES A DAY FOR 2 WEEKS THEN USE AS NEEDED FOR LICHEN SCLEROSIS 45 g 3  . cyclobenzaprine (FLEXERIL) 10 MG tablet TAKE 1 TABLET (10 MG TOTAL) BY MOUTH 2 (TWO) TIMES DAILY AS NEEDED (PAIN).    Marland Kitchen EMGALITY 120 MG/ML SOAJ 120 mg every 30 (thirty) days.     . fenofibrate 160 MG tablet Take 1 tablet (160 mg total) by mouth daily. 90 tablet 3  . gabapentin (NEURONTIN) 300 MG capsule Take 1 capsule (300 mg total) by mouth at bedtime. 90 capsule 3  . glucose blood (ACCU-CHEK AVIVA) test strip Use to check blood sugar once daily 100 each 3  . levothyroxine (SYNTHROID, LEVOTHROID) 75 MCG tablet TAKE ONE TABLET BY MOUTH EVERY MORNING WITH BREAKFAST 90 tablet 3  . lidocaine (XYLOCAINE) 5 % ointment APPLY 1/2 TO 1 INCH INSIDE ANUS 3 TO 4 TIMES A DAY. 30 g 0  . magnesium gluconate (MAGONATE) 500 MG tablet Take 500 mg by mouth daily.     . metoprolol succinate (TOPROL-XL) 50 MG 24 hr tablet Take 1 tablet (50 mg total) by mouth daily. Take with or immediately following a meal. 90 tablet 3  . Multiple Vitamins-Minerals (ONE-A-DAY WOMENS PETITES PO) Take 2 tablets by mouth daily.    . nitroGLYCERIN (NITROSTAT) 0.4 MG SL tablet 1 tablet under tongue at onset of chest pain; you may repeat every 5 minutes for up to 3 doses. 25 tablet 3  . ondansetron (ZOFRAN ODT) 4 MG disintegrating tablet Take 1 tablet (4 mg total) by mouth every 8 (eight) hours as needed for nausea or vomiting. 20 tablet 0  . pantoprazole (PROTONIX) 40 MG tablet Take 1 tablet (40 mg total) by mouth daily. 90 tablet 3  . Prasterone (INTRAROSA) 6.5 MG INST Place 1 suppository vaginally daily. 28 each 12  . pravastatin (PRAVACHOL) 40 MG tablet Take 1 tablet (40 mg total) by mouth at bedtime. 90 tablet 3  . sertraline (ZOLOFT) 50 MG tablet Take 1 tablet (50 mg total) by mouth  daily. 90 tablet 3   Current Facility-Administered Medications on File Prior to Visit  Medication Dose Route  Frequency Provider Last Rate Last Dose  . 0.9 %  sodium chloride infusion  500 mL Intravenous Continuous Ladene Artist, MD         Family Hx: The patient's family history includes ALS in her mother; Breast cancer in her paternal grandmother; Breast cancer (age of onset: 76) in her sister; Cancer (age of onset: 38) in her sister; Colon cancer in her maternal grandfather; Coronary artery disease in her mother; Diabetes in her mother; Down syndrome in her son; Hyperlipidemia in her father; Lung cancer in her paternal grandfather; Other in her daughter. There is no history of Esophageal cancer, Liver cancer, Pancreatic cancer, Rectal cancer, or Stomach cancer.  ROS:   Please see the history of present illness.    Review of Systems  Constitutional: Negative.   HENT: Negative.   Respiratory: Negative.   Cardiovascular: Positive for chest pain.       Left shoulder pain  Gastrointestinal: Negative.   Musculoskeletal: Negative.   Neurological: Negative.   Psychiatric/Behavioral: Negative.   All other systems reviewed and are negative.     Labs/Other Tests and Data Reviewed:    Recent Labs: 09/23/2018: ALT 24; TSH 4.45 11/01/2018: BUN 17; Creatinine, Ser 0.80; Hemoglobin 12.4; Platelets 287; Potassium 3.6; Sodium 139   Recent Lipid Panel Lab Results  Component Value Date/Time   CHOL 138 09/23/2018 11:19 AM   CHOL 151 10/17/2011 08:26 AM   TRIG 188.0 (H) 09/23/2018 11:19 AM   TRIG 136 10/17/2011 08:26 AM   HDL 38.20 (L) 09/23/2018 11:19 AM   HDL 44 10/17/2011 08:26 AM   CHOLHDL 4 09/23/2018 11:19 AM   LDLCALC 62 09/23/2018 11:19 AM   LDLCALC 80 10/17/2011 08:26 AM   LDLDIRECT 78.0 05/23/2017 09:29 AM    Wt Readings from Last 3 Encounters:  11/01/18 165 lb (74.8 kg)  09/26/18 163 lb (73.9 kg)  01/02/18 165 lb (74.8 kg)     Exam:    Vital Signs: Vital signs may also  be detailed in the HPI There were no vitals taken for this visit.  Wt Readings from Last 3 Encounters:  11/01/18 165 lb (74.8 kg)  09/26/18 163 lb (73.9 kg)  01/02/18 165 lb (74.8 kg)   Temp Readings from Last 3 Encounters:  11/01/18 97.7 F (36.5 C) (Oral)  09/26/18 98.7 F (37.1 C) (Oral)  01/02/18 98.6 F (37 C)   BP Readings from Last 3 Encounters:  11/02/18 135/79  09/26/18 (!) 147/78  01/02/18 119/74   Pulse Readings from Last 3 Encounters:  11/02/18 74  09/26/18 80  01/02/18 (!) 16    130s/70, Pulse 70, resp 16  Well nourished, well developed female in no acute distress. Constitutional:  oriented to person, place, and time. No distress.  Head: Normocephalic and atraumatic.  Eyes:  no discharge. No scleral icterus.  Neck: Normal range of motion. Neck supple.  Pulmonary/Chest: No audible wheezing, no distress, appears comfortable Musculoskeletal: Normal range of motion.  no  tenderness or deformity.  Neurological:   Coordination normal. Full exam not performed Skin:  No rash Psychiatric:  normal mood and affect. behavior is normal. Thought content normal.    ASSESSMENT & PLAN:    Shortness of breath Deconditioned,  Needs walking program, weight loss, lifestyle changes Chronic issue  Mixed hyperlipidemia Cholesterol is at goal on the current lipid regimen. No changes to the medications were made.  Chronic pain syndrome Having shoulder pain, neck pain she is concerned about angina Low suspicion given prior stress  test prior catheterization prior CT scans with no disease but she is again concerned At her request pharmacological Myoview, she is unable to treadmill given poor conditioning, shortness of breath on exertion  Major depressive disorder, recurrent episode, in full remission ([place]) Managed by primary care   COVID-19 Education: The signs and symptoms of COVID-19 were discussed with the patient and how to seek care for testing (follow up with PCP or  arrange E-visit).  The importance of social distancing was discussed today.  Patient Risk:   After full review of this patients clinical status, I feel that they are at least moderate risk at this time.  Time:   Today, I have spent 25 minutes with the patient with telehealth technology discussing the cardiac and medical problems/diagnoses detailed above   10 min spent reviewing the chart prior to patient visit today   Medication Adjustments/Labs and Tests Ordered: Current medicines are reviewed at length with the patient today.  Concerns regarding medicines are outlined above.   Tests Ordered: No tests ordered   Medication Changes: No changes made   Disposition: Follow-up as needed   Signed, Ida [name], MD  11/05/2018 10:41 AM    [place] [facility] [address] [place] [address], [place], [place] [address]

## 2018-11-05 ENCOUNTER — Other Ambulatory Visit: Payer: Self-pay

## 2018-11-05 ENCOUNTER — Telehealth (INDEPENDENT_AMBULATORY_CARE_PROVIDER_SITE_OTHER): Payer: BLUE CROSS/BLUE SHIELD | Admitting: Cardiovascular Disease

## 2018-11-05 DIAGNOSIS — R079 Chest pain, unspecified: Secondary | ICD-10-CM

## 2018-11-05 DIAGNOSIS — R0602 Shortness of breath: Secondary | ICD-10-CM

## 2018-11-05 DIAGNOSIS — I2510 Atherosclerotic heart disease of native coronary artery without angina pectoris: Secondary | ICD-10-CM | POA: Diagnosis not present

## 2018-11-05 DIAGNOSIS — E782 Mixed hyperlipidemia: Secondary | ICD-10-CM

## 2018-11-05 DIAGNOSIS — G894 Chronic pain syndrome: Secondary | ICD-10-CM | POA: Diagnosis not present

## 2018-11-05 DIAGNOSIS — F3342 Major depressive disorder, recurrent, in full remission: Secondary | ICD-10-CM

## 2018-11-05 NOTE — Telephone Encounter (Signed)
Telehealth consent obtained though mychart.

## 2018-11-05 NOTE — Patient Instructions (Addendum)
Medication Instructions:  No changes  If you need a refill on your cardiac medications before your next appointment, please call your pharmacy.    Lab work: No new labs needed   If you have labs (blood work) drawn today and your tests are completely normal, you will receive your results only by: Marland Kitchen MyChart Message (if you have MyChart) OR . A paper copy in the mail If you have any lab test that is abnormal or we need to change your treatment, we will call you to review the results.   Testing/Procedures: [place]  Your caregiver has ordered a Stress Test with nuclear imaging. The purpose of this test is to evaluate the blood supply to your heart muscle. This procedure is referred to as a "Non-Invasive Stress Test." This is because other than having an IV started in your vein, nothing is inserted or "invades" your body. Cardiac stress tests are done to find areas of poor blood flow to the heart by determining the extent of coronary artery disease (CAD). Some patients exercise on a treadmill, which naturally increases the blood flow to your heart, while others who are  unable to walk on a treadmill due to physical limitations have a pharmacologic/chemical stress agent called Lexiscan . This medicine will mimic walking on a treadmill by temporarily increasing your coronary blood flow.   Please note: these test may take anywhere between 2-4 hours to complete  PLEASE REPORT TO [place] AT THE FIRST DESK WILL DIRECT YOU WHERE TO GO  Date of Procedure:_____________________________________  Arrival Time for Procedure:______________________________  Instructions regarding medication:    _XX___:  Hold Metoprolol the night before procedure and morning of procedure   PLEASE NOTIFY THE OFFICE AT LEAST 24 HOURS IN ADVANCE IF YOU ARE UNABLE TO KEEP YOUR APPOINTMENT.  [phone] AND  PLEASE NOTIFY NUCLEAR MEDICINE AT [facility] AT LEAST 24 HOURS IN ADVANCE IF YOU  ARE UNABLE TO KEEP YOUR APPOINTMENT. [phone]  How to prepare for your Myoview test:  1. Do not eat or drink after midnight 2. No caffeine for 24 hours prior to test 3. No smoking 24 hours prior to test. 4. Your medication may be taken with water.  If your doctor stopped a medication because of this test, do not take that medication. 5. Ladies, please do not wear dresses.  Skirts or pants are appropriate. Please wear a short sleeve shirt. 6. No perfume, cologne or lotion. 7. Wear comfortable walking shoes. No heels!   Follow-Up: At [facility], you and your health needs are our priority.  As part of our continuing mission to provide you with exceptional heart care, we have created designated Provider Care Teams.  These Care Teams include your primary Cardiologist (physician) and Advanced Practice Providers (APPs -  Physician Assistants and Nurse Practitioners) who all work together to provide you with the care you need, when you need it.  . You will need a follow up appointment in 12 months .   Please call our office 2 months in advance to schedule this appointment.    . Providers on your designated Care Team:   . Murray Hodgkins, NP . Christell Faith, PA-C . Marrianne Mood, PA-C  Any Other Special Instructions Will Be Listed Below (If Applicable).  For educational health videos Log in to : [email or web address] Or : [email or web address], password : [email or web address]

## 2018-11-08 ENCOUNTER — Other Ambulatory Visit: Payer: Self-pay

## 2018-11-08 ENCOUNTER — Encounter
Admission: RE | Admit: 2018-11-08 | Discharge: 2018-11-08 | Disposition: A | Discharge status: Home / Self Care | Payer: BLUE CROSS/BLUE SHIELD | Source: Ambulatory Visit | Attending: Cardiovascular Disease | Admitting: Cardiovascular Disease

## 2018-11-08 DIAGNOSIS — R079 Chest pain, unspecified: Secondary | ICD-10-CM

## 2018-11-08 LAB — NM MYOCAR MULTI W/SPECT W/WALL MOTION / EF
LV dias vol: 45 mL (ref 46–106)
LV sys vol: 26 mL
Peak HR: 106 {beats}/min
Percent HR: 67 %
Rest HR: 73 {beats}/min
SDS: 0
SRS: 0
SSS: 0
TID: 0.76

## 2018-11-08 MED ORDER — TECHNETIUM TC 99M TETROFOSMIN IV KIT
10.4680 | PACK | Freq: Once | INTRAVENOUS | Status: AC | PRN
  Administered 2018-11-08: 10.468 via INTRAVENOUS

## 2018-11-08 MED ORDER — TECHNETIUM TC 99M TETROFOSMIN IV KIT
29.7600 | PACK | Freq: Once | INTRAVENOUS | Status: AC | PRN
  Administered 2018-11-08: 11:00:00 29.76 via INTRAVENOUS

## 2018-11-08 MED ORDER — REGADENOSON 0.4 MG/5ML IV SOLN
0.4000 mg | Freq: Once | INTRAVENOUS | Status: AC
  Administered 2018-11-08: 0.4 mg via INTRAVENOUS
  Filled 2018-11-08: qty 5

## 2019-03-12 ENCOUNTER — Other Ambulatory Visit (HOSPITAL_COMMUNITY): Payer: Self-pay | Admitting: Neurology

## 2019-03-12 ENCOUNTER — Other Ambulatory Visit: Payer: Self-pay | Admitting: Neurology

## 2019-03-12 DIAGNOSIS — R413 Other amnesia: Secondary | ICD-10-CM

## 2019-03-19 ENCOUNTER — Ambulatory Visit (HOSPITAL_COMMUNITY)
Admission: RE | Admit: 2019-03-19 | Discharge: 2019-03-19 | Disposition: A | Discharge status: Home / Self Care | Payer: BLUE CROSS/BLUE SHIELD | Source: Ambulatory Visit | Attending: Neurology | Admitting: Neurology

## 2019-03-19 ENCOUNTER — Other Ambulatory Visit: Payer: Self-pay

## 2019-03-19 DIAGNOSIS — R413 Other amnesia: Secondary | ICD-10-CM

## 2019-03-19 IMAGING — MR MR HEAD W/O CM
14 of 15 series · 40 of 48 positions shown · non-contrast
Comparison: Head CT [DATE]

CLINICAL DATA: Memory loss.  Headache for several months

EXAM:
MRI HEAD WITHOUT CONTRAST
TECHNIQUE: Multiplanar, multiecho pulse sequences of the brain and surrounding
structures were obtained without intravenous contrast.
Additionally, using NeuroQuant software a 3D volumetric analysis of
the brain was performed and is compared to a normative database
adjusted for age, gender and intracranial volume.

[Series 2: DWI · axial · 3.0mm · 0.94mm/px · z∈[-57,+84]mm · 4 of 100 slices shown (1 of 2)]
[im 1/100]
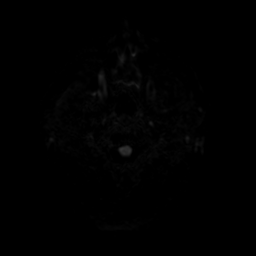
[im 34/100]
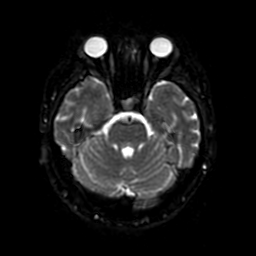
[im 67/100]
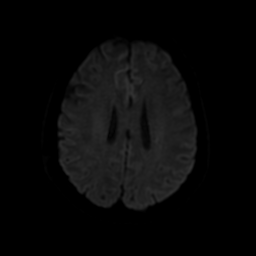
[im 100/100]
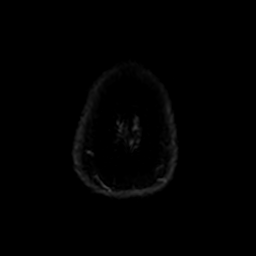

[Series 3: T1 · sagittal · 1.2mm · 0.94mm/px · 5 of 156 slices shown (1 of 2)]
[im 1/156]
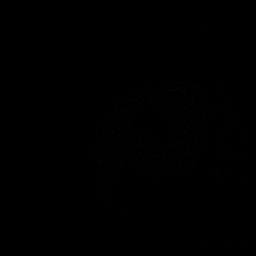
[im 39/156]
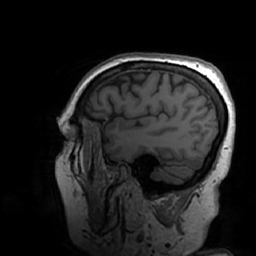
[im 78/156]
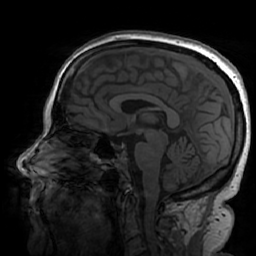
[im 117/156]
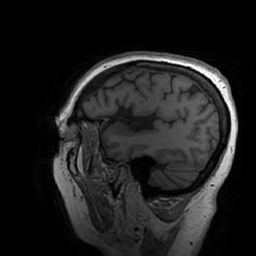
[im 156/156]
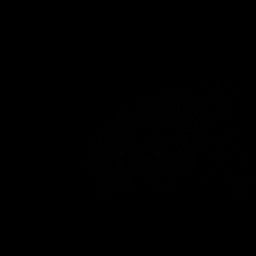

[Series 4: FLAIR · axial · 5.0mm · 0.47mm/px · 1 of 25 slices shown (1 of 2)]
[im 1/25]
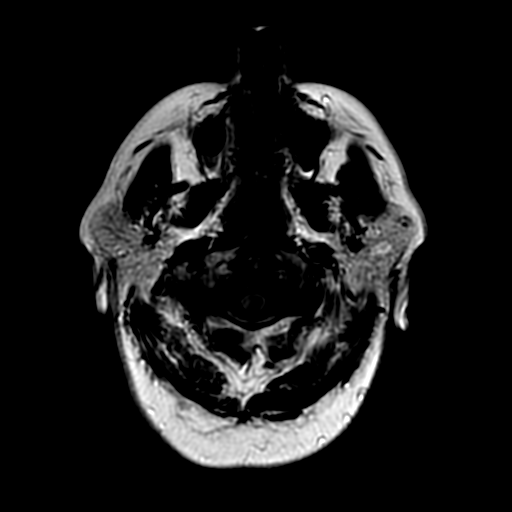

[Series 5: T2 · axial · 5.0mm · 0.47mm/px · 1 of 25 slices shown (1 of 2)]
[im 1/25]
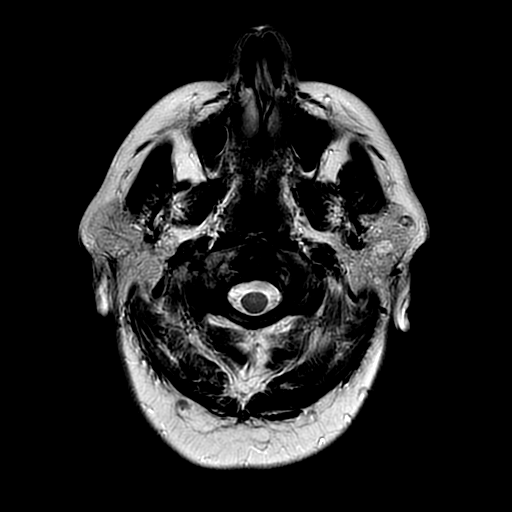

[Series 6: DWI · coronal · 4.0mm · 0.94mm/px · 2 of 72 slices shown (2 of 2)]
[im 1/72]
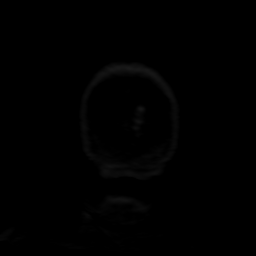
[im 72/72]
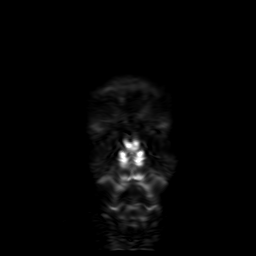

[Series 7: FLAIR · sagittal · 5.0mm · 0.47mm/px · 1 of 23 slices shown (2 of 2)]
[im 1/23]
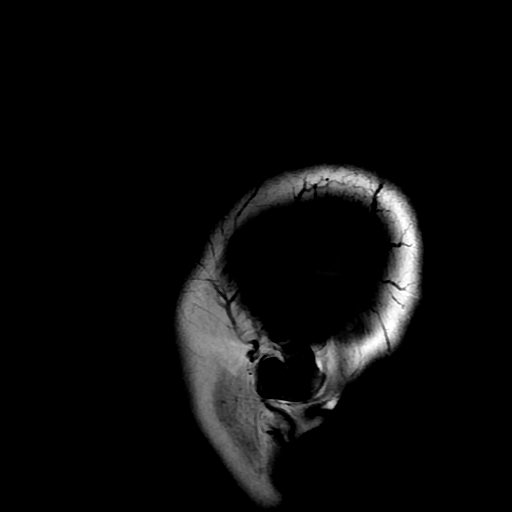

[Series 8: SWI · axial · 3.0mm · 0.47mm/px · z∈[-57,+85]mm · 3 of 100 slices shown (1 of 2)]
[im 1/100]
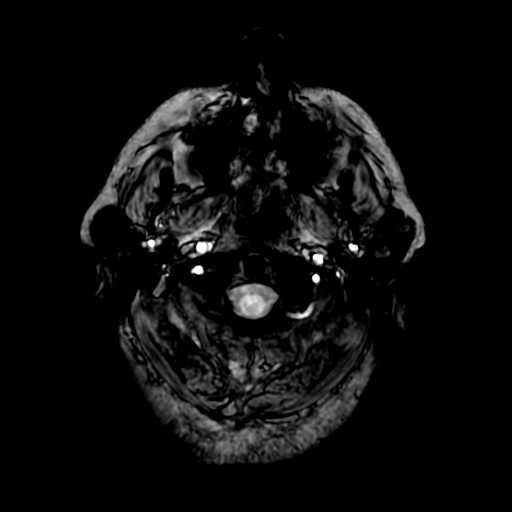
[im 50/100]
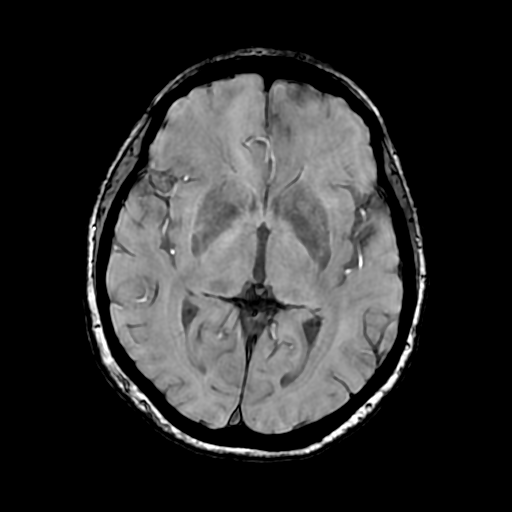
[im 100/100]
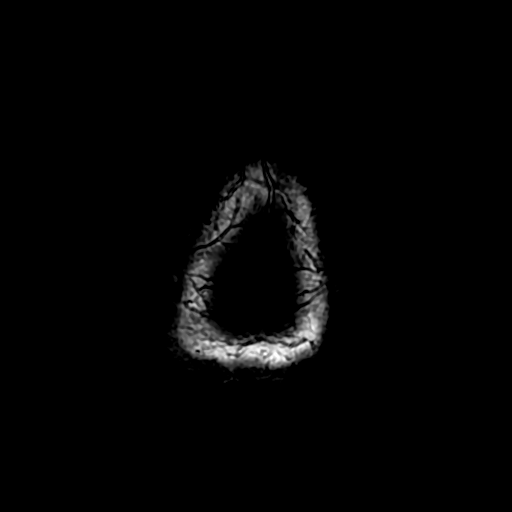

[Series 9: T1 · axial · non-contrast · 3.0mm · 0.94mm/px · z∈[-54,+81]mm · 2 of 48 slices shown (2 of 2)]
[im 1/48]
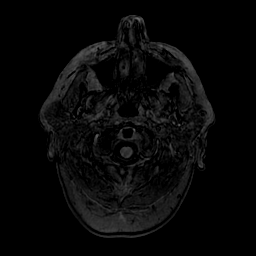
[im 48/48]
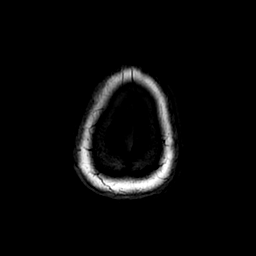

[Series 10: T2 · coronal · 5.0mm · 0.47mm/px · 1 of 30 slices shown (2 of 2)]
[im 1/30]
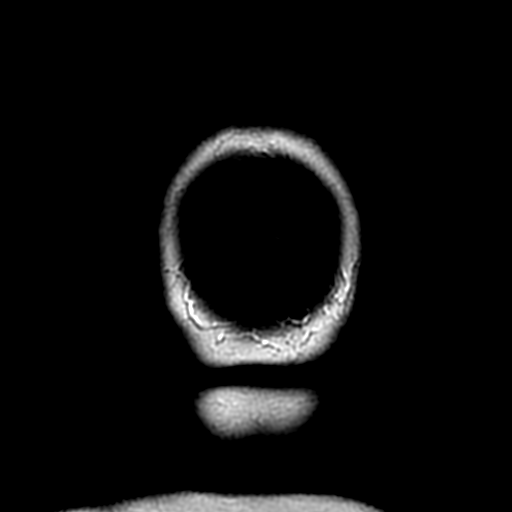

[Series 52: nqsegcb_sc_cor · 1.00mm/px · 8 of 232 slices shown]
[im 1/232]
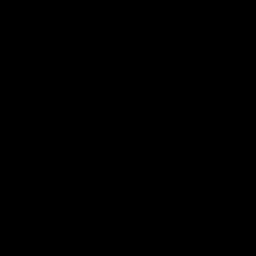
[im 34/232]
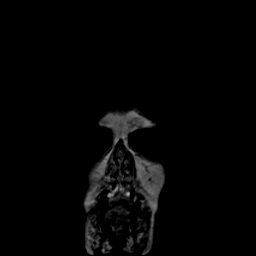
[im 67/232]
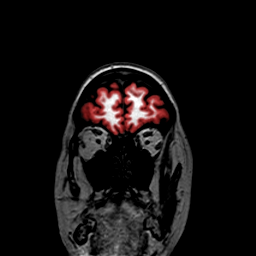
[im 100/232]
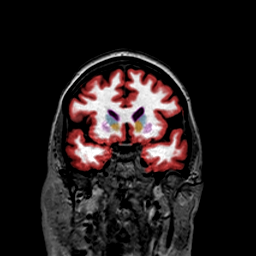
[im 133/232]
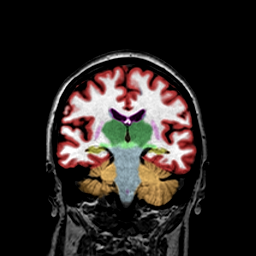
[im 166/232]
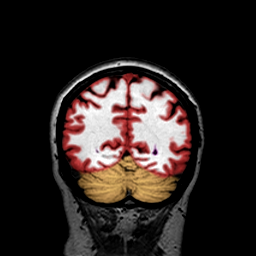
[im 199/232]
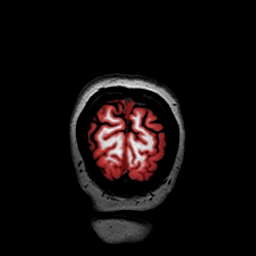
[im 232/232]
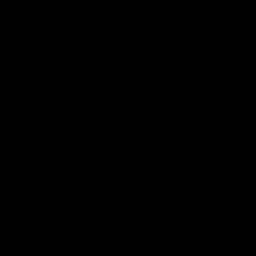

[Series 53: nqsegcb_sc_axl · 1.00mm/px · 6 of 219 slices shown]
[im 1/219]
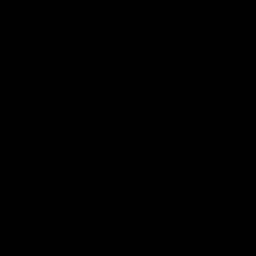
[im 37/219]
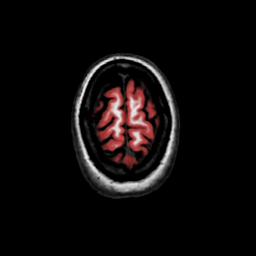
[im 73/219]
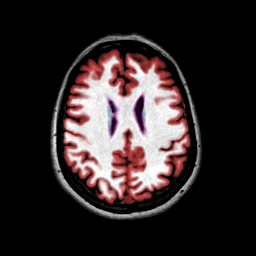
[im 110/219]
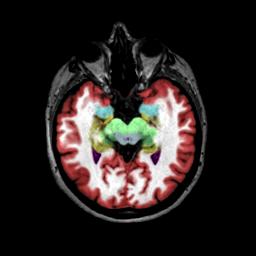
[im 146/219]
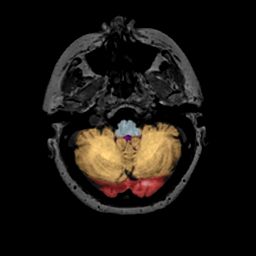
[im 182/219]
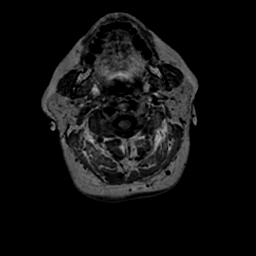

[Series 250: ADC · axial · 3.0mm · 0.94mm/px · z∈[-57,+84]mm · 2 of 50 slices shown (1 of 2)]
[im 1/50]
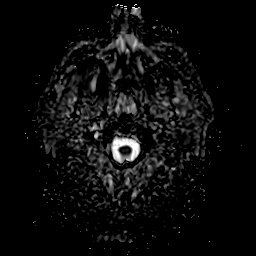
[im 50/50]
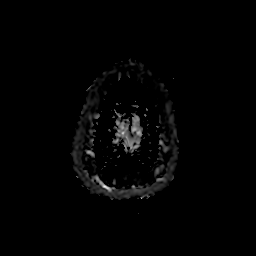

[Series 650: ADC · coronal · 4.0mm · 0.94mm/px · 1 of 36 slices shown (2 of 2)]
[im 1/36]
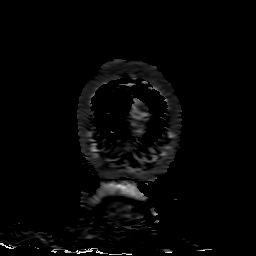

[Series 800: SWI · axial · 3.0mm · 0.47mm/px · z∈[-57,+85]mm · 3 of 100 slices shown (2 of 2)]
[im 1/100]
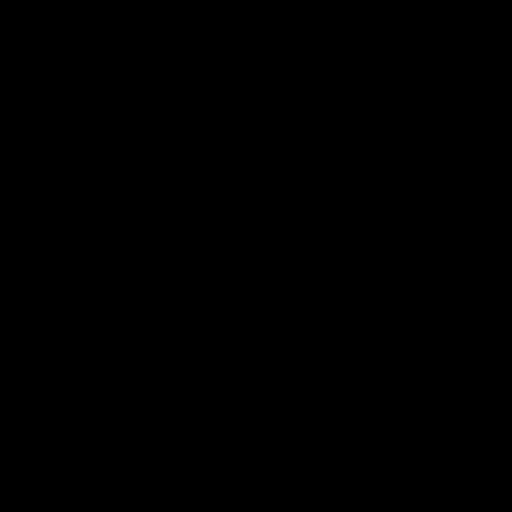
[im 50/100]
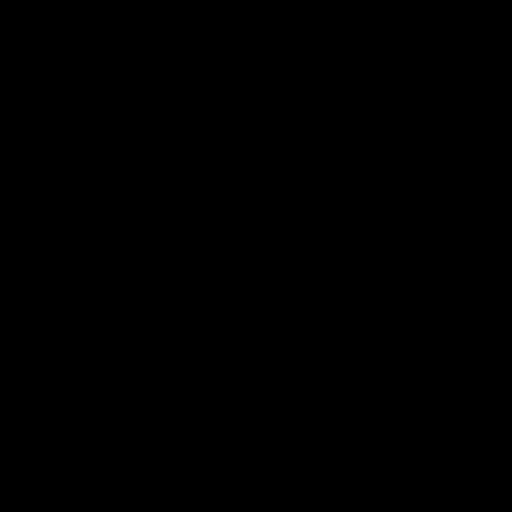
[im 100/100]
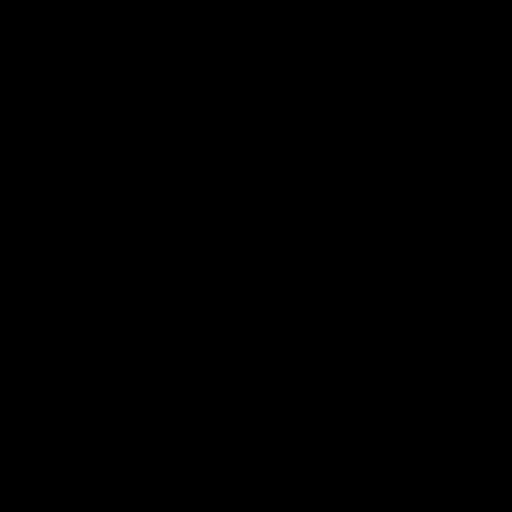

[40 of 48 positions shown; findings below may reference images not displayed]

FINDINGS: Brain: No acute or subacute infarction, hemorrhage, hydrocephalus,
extra-axial collection or mass lesion. Small cleft like area of
increased T2 signal in the left corona radiata from uncertain cause;
no associated chronic blood products and no generalized white matter
disease or ischemic injury.

Vascular: Normal flow voids

Skull and upper cervical spine: Normal marrow signal.

Sinuses/Orbits: Negative

NeuroQuant Findings:

Volumetric analysis of the brain was performed, with a fully
detailed report in [HOSPITAL] PACS. Briefly, the comparison with age and
gender matched reference reveals well preserved brain volume which
matches with subjective assessment. There is over registration of
cortex due to mis-registered superior sagittal sinus, but this is
not clinically significant.
IMPRESSION: 1. Negative for atrophy or other explanation for memory loss.
2. NeuroQuant volumetric analysis of the brain, see details on
[HOSPITAL] PACS.

## 2019-03-24 ENCOUNTER — Telehealth: Payer: Self-pay

## 2019-03-24 NOTE — Telephone Encounter (Signed)
-----   Message from Gae Dry, MD sent at 03/24/2019 11:49 AM EDT ----- Regarding: MMG Received notice she has not received MMG yet as ordered at her Annual. Please check and encourage her to do this, and document conversation.

## 2019-03-24 NOTE — Telephone Encounter (Signed)
Left message to remind pt to schedule her mammogram. 

## 2019-04-07 ENCOUNTER — Encounter: Payer: Self-pay | Admitting: Family Medicine

## 2019-04-07 ENCOUNTER — Ambulatory Visit (INDEPENDENT_AMBULATORY_CARE_PROVIDER_SITE_OTHER): Payer: BLUE CROSS/BLUE SHIELD | Admitting: Family Medicine

## 2019-04-07 ENCOUNTER — Other Ambulatory Visit: Payer: Self-pay

## 2019-04-07 VITALS — BP 120/70 | HR 71 | Temp 97.7°F | Ht 64.0 in | Wt 167.5 lb

## 2019-04-07 DIAGNOSIS — Z23 Encounter for immunization: Secondary | ICD-10-CM

## 2019-04-07 DIAGNOSIS — F5101 Primary insomnia: Secondary | ICD-10-CM

## 2019-04-07 MED ORDER — TRAZODONE HCL 50 MG PO TABS: 25.0000 mg | ORAL_TABLET | Freq: Every evening | ORAL | 3 refills | Status: DC | PRN

## 2019-04-07 NOTE — Progress Notes (Signed)
Bravery Ketcham T. Norabelle Kondo, MD Primary Care and [place] at [facility] [place], [address] Phone: [phone]  FAX: [phone]  DONAH BIERNAT - 63 y.o. female  MRN [id number]  Date of Birth: [birth year]  Visit Date: 04/07/2019  PCP: Owens Loffler, MD  Referred by: Owens Loffler, MD  Chief Complaint  Patient presents with  . Insomnia   Subjective:   Brandi Mahoney is a 63 y.o. very pleasant female patient who presents with the following:  Six months.  Bed and cannot fall asleep.  Used to be able to sleep all the time.  Decreased her caffiene intake.  She only drinks 1 cup of coffee in the morning.  She has tried doing some basic sleep hygiene which does not really help.  She also has tried some melatonin in the evening as well as 50 mg of Benadryl.  None of this is seem to help.  This is all relatively new.  She does use a CPAP  Not nervous at all.   OTC, has tried some melatonin at 10 mg, tylenol PM, 50 mg.     Past Medical History, Surgical History, Social History, Family History, Problem List, Medications, and Allergies have been reviewed and updated if relevant.  Patient Active Problem List   Diagnosis Date Noted  . Coronary artery disease involving native coronary artery of native heart with angina pectoris [facility])     Priority: High  . Mixed hyperlipidemia 04/01/2010    Priority: High  . HYPERTRIGLYCERIDEMIA 07/21/2009    Priority: High  . Hypothyroidism in adult 09/08/2008    Priority: High  . Obstructive sleep apnea 09/25/2013    Priority: Medium  . Tobacco abuse, in remission 09/19/2013    Priority: Medium  . Major depressive disorder, recurrent episode, in full remission ([place]) 09/08/2008    Priority: Medium  . Lichen sclerosus [id number]  . Type 2 diabetes, diet controlled ([place])   . Anxiety   . IBS (irritable bowel syndrome) 09/26/2012  . PARESTHESIA, HANDS 06/01/2010  . 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 ASTHMA 09/08/2008    Past Medical History:  Diagnosis Date  . Asthma    does not use an inhaler.  Dx over 20 yrsago.  . Blood transfusion without reported diagnosis    2016 Kidney stone removal  . CAD (coronary artery disease)    a. 01/2010 Cath: mild atherosclerosis in the mid LCX after takeoff of OM1, mild diff LAD dzs;  b. 07/2013 MV: EF 67%, no ischemia.  . Depression   . Heart murmur   . Hemorrhoids   . History of pneumonia 1969  . HLD (hyperlipidemia)   . Hypothyroidism   . IBS (irritable bowel syndrome)   . Migraine headache   . Myocarditis ([place])    a. 01/2010 presented w/ c/p and elev trop-->Cath: mild atherosclerosis in the mid LCX after takeoff of OM1, mild diff LAD dzs;  b. 03/2010 Echo: EF 60-65%, DD, mild LVH;  c. 07/2011 Echo: EF 55-60%, mild LVH, no rwma.  . Prediabetes   . Sleep apnea    has c-pap.  wears off and on - not lately 01-01-18 per reported    Past Surgical History:  Procedure Laterality Date  . APPENDECTOMY  1974  . BREAST EXCISIONAL BIOPSY Right 1988   neg  . CARDIAC CATHETERIZATION  8/11   no significant -[facility]- Dr Rockey Situ,  . CHOLECYSTECTOMY  2002  . COLONOSCOPY  2013  .  CYST EXCISION    . NEPHROLITHOTOMY Right 08/20/2014   Procedure: RIGHT PERCUTANEOUS NEPHROLITHOTOMY ;  Surgeon: Malka So, MD;  Location: [facility];  Service: Urology;  Laterality: Right;  . POLYPECTOMY    . TOTAL ABDOMINAL HYSTERECTOMY W/ BILATERAL SALPINGOOPHORECTOMY  2000   no ovaries, uterus, or cervix.    Social History   Socioeconomic History  . Marital status: Married    Spouse name: Nicole Kindred  . Number of children: 3  . Years of education: Not on file  . Highest education level: Not on file  Occupational History  . 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   Social Needs  . Financial resource strain: Not on file  . Food insecurity    Worry: Not on file    Inability: Not on file  .  Transportation needs    Medical: Not on file    Non-medical: Not on file  Tobacco Use  . Smoking status: Former Smoker    Packs/day: 0.35    Years: 35.00    Pack years: 12.25    Types: Cigarettes    Quit date: 09/25/2008    Years since quitting: 10.5  . Smokeless tobacco: Never Used  . Tobacco comment: quit in 2007-2008, social smoker  Substance and Sexual Activity  . Alcohol use: Yes    Comment: rarely  . Drug use: No  . Sexual activity: Not on file  Lifestyle  . Physical activity    Days per week: Not on file    Minutes per session: Not on file  . Stress: Not on file  Relationships  . Social Herbalist on phone: Not on file    Gets together: Not on file    Attends religious service: Not on file    Active member of club or organization: Not on file    Attends meetings of clubs or organizations: Not on file    Relationship status: Not on file  . Intimate partner violence    Fear of current or ex partner: Not on file    Emotionally abused: Not on file    Physically abused: Not on file    Forced sexual activity: Not on file  Other Topics Concern  . Not on file  Social History Narrative   Lives in [place] w/ husband.  No reg exercise.    Family History  Problem Relation Age of Onset  . Coronary artery disease Mother   . Diabetes Mother   . ALS Mother   . Hyperlipidemia Father   . Breast cancer Paternal Grandmother   . Lung cancer Paternal Grandfather        CAD  . Other Daughter        gluten  . Down syndrome Son   . Colon cancer Maternal Grandfather        dx early 28's   . Cancer Sister 75       breast  . Breast cancer Sister 84  . Esophageal cancer Neg Hx   . Liver cancer Neg Hx   . Pancreatic cancer Neg Hx   . Rectal cancer Neg Hx   . Stomach cancer Neg Hx     Allergies  Allergen Reactions  . Pseudoeph-Doxylamine-Dm-Apap Anaphylaxis    Allergy to nyquil  . Gluten Other (See Comments)    Gi upset  . Penicillins Itching and Rash     Medication list reviewed and updated in  full in [facility]   GEN: No acute illnesses, no fevers, chills. GI: No n/v/d, eating normally Pulm: No SOB Interactive and getting along well at home.  Otherwise, ROS is as per the HPI.  Objective:   BP 120/70   Pulse 71   Temp 97.7 F (36.5 C) (Temporal)   Ht 5\' 4"  (1.626 m)   Wt 167 lb 8 oz (76 kg)   SpO2 94%   BMI 28.75 kg/m   GEN: WDWN, NAD, Non-toxic, A & O x 3 HEENT: Atraumatic, Normocephalic. Neck supple. No masses, No LAD. \NEURO Normal gait.  PSYCH: Normally interactive. Conversant. Not depressed or anxious appearing.  Calm demeanor.   Laboratory and Imaging Data:  Assessment and Plan:     ICD-10-CM   1. Primary insomnia  F51.01   2. Need for influenza vaccination  Z23 Flu Vaccine QUAD 6+ mos PF IM (Fluarix Quad PF)   >15 minutes spent in face to face time with patient, >50% spent in counselling or coordination of care   Failure of outpatient OTC medications.  Reviewed sleep hygiene and will also start some trazodone, titrate up the dose to effect.  Patient Instructions  If 50 mg, then can increase to two tablets.  If that works, then I can send you in a hundred milligram tabs.    Follow-up: No follow-ups on file.  Meds ordered this encounter  Medications  . traZODone (DESYREL) 50 MG tablet    Sig: Take 0.5-1 tablets (25-50 mg total) by mouth at bedtime as needed for sleep.    Dispense:  30 tablet    Refill:  3   Orders Placed This Encounter  Procedures  . Flu Vaccine QUAD 6+ mos PF IM (Fluarix Quad PF)    Signed,  Lekeisha Arenas T. Dallie Patton, MD   Outpatient Encounter Medications as of 04/07/2019  Medication Sig  . aspirin EC 81 MG tablet Take 81 mg by mouth daily.  . clobetasol cream (TEMOVATE) 0.05 % APPLY TWO TIMES A DAY FOR 2 WEEKS THEN USE AS NEEDED FOR LICHEN SCLEROSIS  . cyclobenzaprine (FLEXERIL) 10 MG tablet TAKE 1 TABLET (10 MG TOTAL) BY MOUTH 2 (TWO) TIMES DAILY AS NEEDED (PAIN).  .  fenofibrate 160 MG tablet Take 1 tablet (160 mg total) by mouth daily.  . Fremanezumab-vfrm (AJOVY) 225 MG/1.5ML SOSY Inject into the skin.  Marland Kitchen gabapentin (NEURONTIN) 300 MG capsule Take 1 capsule (300 mg total) by mouth at bedtime.  Marland Kitchen glucose blood (ACCU-CHEK AVIVA) test strip Use to check blood sugar once daily  . levothyroxine (SYNTHROID, LEVOTHROID) 75 MCG tablet TAKE ONE TABLET BY MOUTH EVERY MORNING WITH BREAKFAST  . lidocaine (XYLOCAINE) 5 % ointment APPLY 1/2 TO 1 INCH INSIDE ANUS 3 TO 4 TIMES A DAY.  . magnesium gluconate (MAGONATE) 500 MG tablet Take 500 mg by mouth daily.   . metoprolol succinate (TOPROL-XL) 50 MG 24 hr tablet Take 1 tablet (50 mg total) by mouth daily. Take with or immediately following a meal.  . Multiple Vitamins-Minerals (ONE-A-DAY WOMENS PETITES PO) Take 2 tablets by mouth daily.  . nitroGLYCERIN (NITROSTAT) 0.4 MG SL tablet 1 tablet under tongue at onset of chest pain; you may repeat every 5 minutes for up to 3 doses.  Marland Kitchen ondansetron (ZOFRAN ODT) 4 MG disintegrating tablet Take 1 tablet (4 mg total) by mouth every 8 (eight) hours as needed for nausea or vomiting.  . pantoprazole (PROTONIX) 40 MG tablet Take 1 tablet (40 mg total) by mouth daily.  Marland Kitchen  Prasterone (INTRAROSA) 6.5 MG INST Place 1 suppository vaginally daily.  . pravastatin (PRAVACHOL) 40 MG tablet Take 1 tablet (40 mg total) by mouth at bedtime.  . rizatriptan (MAXALT) 10 MG tablet   . sertraline (ZOLOFT) 50 MG tablet Take 1 tablet (50 mg total) by mouth daily.  . traZODone (DESYREL) 50 MG tablet Take 0.5-1 tablets (25-50 mg total) by mouth at bedtime as needed for sleep.  . [DISCONTINUED] EMGALITY 120 MG/ML SOAJ 120 mg every 30 (thirty) days.    Facility-Administered Encounter Medications as of 04/07/2019  Medication  . 0.9 %  sodium chloride infusion

## 2019-04-07 NOTE — Patient Instructions (Signed)
If 50 mg, then can increase to two tablets.  If that works, then I can send you in a hundred milligram tabs.

## 2019-06-19 ENCOUNTER — Other Ambulatory Visit: Payer: Self-pay | Admitting: Obstetrics & Gynecology

## 2019-06-19 ENCOUNTER — Telehealth: Payer: Self-pay | Admitting: Obstetrics & Gynecology

## 2019-06-19 NOTE — Telephone Encounter (Signed)
-----   Message from Gae Dry, MD sent at 06/19/2019  1:36 PM EST ----- Regarding: Sch Annual soon Also remind to have MMG done

## 2019-06-19 NOTE — Telephone Encounter (Signed)
Called and spoke with Augustin Coupe and advised to schedule mmg per [facility]

## 2019-07-09 ENCOUNTER — Ambulatory Visit (INDEPENDENT_AMBULATORY_CARE_PROVIDER_SITE_OTHER): Payer: BLUE CROSS/BLUE SHIELD | Admitting: Family Medicine

## 2019-07-09 ENCOUNTER — Other Ambulatory Visit: Payer: Self-pay

## 2019-07-09 ENCOUNTER — Encounter: Payer: Self-pay | Admitting: Family Medicine

## 2019-07-09 ENCOUNTER — Telehealth: Payer: Self-pay | Admitting: *Deleted

## 2019-07-09 VITALS — Ht 64.0 in

## 2019-07-09 DIAGNOSIS — K529 Noninfective gastroenteritis and colitis, unspecified: Secondary | ICD-10-CM | POA: Diagnosis not present

## 2019-07-09 MED ORDER — ONDANSETRON 4 MG PO TBDP: 4.0000 mg | ORAL_TABLET | Freq: Three times a day (TID) | ORAL | 2 refills | Status: DC | PRN

## 2019-07-09 NOTE — Telephone Encounter (Signed)
Patient called stating that she started with diarrhea last night and dry heaves. Patient stated that feels real nauseated. Patient stated that she is real tired. Patient stated that she had an old prescription for Zofran that has expired and wanted to know if Dr. Lorelei Pont would send her a new script in? Patient was scheduled for a virtual visit with Dr. Lorelei Pont today.

## 2019-07-09 NOTE — Progress Notes (Signed)
     Brandi Mahoney T. Jaan Fischel, MD Primary Care and [place] at [facility] [place], [address] Phone: [phone]  FAX: [phone]  Brandi Mahoney - 64 y.o. female  MRN [id number]  Date of Birth: [birth year]  Visit Date: 07/09/2019  PCP: Owens Loffler, MD  Referred by: Owens Loffler, MD Chief Complaint  Patient presents with  . Diarrhea  . Dry Heaves  . Fatigue   Virtual Visit via Video Note:  I connected with  Brandi Mahoney on 07/09/2019  3:20 PM EST by a video enabled telemedicine application and verified that I am speaking with the correct person using two identifiers.   Location patient: home computer, tablet, or smartphone Location provider: work or home office Consent: Verbal consent directly obtained from [name] Persons participating in the virtual visit: patient, provider  I discussed the limitations of evaluation and management by telemedicine and the availability of in person appointments. The patient expressed understanding and agreed to proceed.  History of Present Illness:  She has been having some significant diarrhea over the last 2 days it was prolific yesterday and it is slowing up right now.  She is also been quite nauseated and very fatigued.  She denies other symptoms including cough, headache, sore throat, earache, rashes chart loss of taste or smell.  No neurological changes.  Review of Systems as above: See pertinent positives and pertinent negatives per HPI No acute distress verbally  Past Medical History, Surgical History, Social History, Family History, Problem List, Medications, and Allergies have been reviewed and updated if relevant.   Observations/Objective/Exam:  An attempt was made to discern vital signs over the phone and per patient if applicable and possible.   General:    Alert, Oriented, appears well and in no acute distress HEENT:     Atraumatic, conjunctiva  clear, no obvious abnormalities on inspection of external nose and ears.  Neck:    Normal movements of the head and neck Pulmonary:     On inspection no signs of respiratory distress, breathing rate appears normal, no obvious gross SOB, gasping or wheezing Cardiovascular:    No obvious cyanosis Musculoskeletal:    Moves all visible extremities without noticeable abnormality Psych / Neurological:     Pleasant and cooperative, no obvious depression or anxiety, speech and thought processing grossly intact  Assessment and Plan:    ICD-10-CM   1. Gastroenteritis  K52.9    Cannot exclude COVID-19.  More likely viral gastroenteritis.  Push fluids.  She is going to get COVID-19 tested tomorrow.  Quarantine for now.    I discussed the assessment and treatment plan with the patient. The patient was provided an opportunity to ask questions and all were answered. The patient agreed with the plan and demonstrated an understanding of the instructions.   The patient was advised to call back or seek an in-person evaluation if the symptoms worsen or if the condition fails to improve as anticipated.  Follow-up: prn unless noted otherwise below No follow-ups on file.  Meds ordered this encounter  Medications  . ondansetron (ZOFRAN-ODT) 4 MG disintegrating tablet    Sig: Take 1 tablet (4 mg total) by mouth every 8 (eight) hours as needed for nausea or vomiting.    Dispense:  20 tablet    Refill:  2   No orders of the defined types were placed in this encounter.   Signed,  Maud Deed. Lucinda Spells, MD

## 2019-07-11 ENCOUNTER — Ambulatory Visit: Payer: BLUE CROSS/BLUE SHIELD | Attending: Internal Medicine

## 2019-07-11 DIAGNOSIS — Z20822 Contact with and (suspected) exposure to covid-19: Secondary | ICD-10-CM

## 2019-07-11 NOTE — Telephone Encounter (Signed)
Patient completed virtual visit with Dr. Lorelei Pont.

## 2019-07-12 LAB — NOVEL CORONAVIRUS, NAA: SARS-CoV-2, NAA: NOT DETECTED

## 2019-08-11 ENCOUNTER — Telehealth: Payer: Self-pay

## 2019-08-11 NOTE — Telephone Encounter (Signed)
Pt left v/m that she has been on Zoloft for a long time and wonders if needs a change due to possible side effects of insomnia, dry mouth and H/As. Pt would like to switch from Zoloft to something else to see if her symptoms subside. Pt last seen diarrhea, heaves and fatigue on 07/09/19 and insomnia on 04/07/19.[facility].Please advise. I spoke with pt and she has no other covid symptoms except H/A,no travel and no known exposure to covid.

## 2019-08-11 NOTE — Telephone Encounter (Signed)
Appointment  3/1

## 2019-08-11 NOTE — Telephone Encounter (Signed)
I am going to need to speak with her at least through a virtual visit to go through all of this.  Tapering off of antidepressants can be tricky, and we need to make sure this is done properly.  Additionally, we should also speak about her insomnia, and this is less likely to be coming from her antidepressants.

## 2019-08-12 ENCOUNTER — Telehealth: Payer: Self-pay | Admitting: Family Medicine

## 2019-08-12 NOTE — Telephone Encounter (Signed)
Patient's husband called today He stated that she is scheduled to have her covid vaccine done, They wanted to know if there is any medications that she should stop taking before she has this done

## 2019-08-13 NOTE — Telephone Encounter (Signed)
Mr. Mallernee notified as instructed by telephone.

## 2019-08-13 NOTE — Telephone Encounter (Signed)
No

## 2019-08-14 ENCOUNTER — Ambulatory Visit: Payer: BLUE CROSS/BLUE SHIELD | Admitting: Obstetrics & Gynecology

## 2019-08-18 ENCOUNTER — Ambulatory Visit: Payer: BLUE CROSS/BLUE SHIELD | Admitting: Family Medicine

## 2019-08-22 ENCOUNTER — Encounter: Payer: Self-pay | Admitting: Family Medicine

## 2019-08-22 DIAGNOSIS — G4733 Obstructive sleep apnea (adult) (pediatric): Secondary | ICD-10-CM

## 2019-08-25 MED ORDER — METOPROLOL SUCCINATE ER 50 MG PO TB24: 50.0000 mg | ORAL_TABLET | Freq: Every day | ORAL | 0 refills | Status: DC

## 2019-08-25 MED ORDER — GABAPENTIN 300 MG PO CAPS: 300.0000 mg | ORAL_CAPSULE | Freq: Every day | ORAL | 3 refills | Status: DC

## 2019-08-25 MED ORDER — SERTRALINE HCL 50 MG PO TABS: 50.0000 mg | ORAL_TABLET | Freq: Every day | ORAL | 0 refills | Status: DC

## 2019-08-25 MED ORDER — FENOFIBRATE 160 MG PO TABS: 160.0000 mg | ORAL_TABLET | Freq: Every day | ORAL | 0 refills | Status: DC

## 2019-08-25 NOTE — Telephone Encounter (Signed)
Last office visit 07/09/2019 for Gastronenteritis.  Last refilled 09/26/2018 for #90 with 3 refills.  No future appointments.

## 2019-11-26 ENCOUNTER — Other Ambulatory Visit: Payer: Self-pay | Admitting: Family Medicine

## 2019-11-27 NOTE — Telephone Encounter (Signed)
Please schedule CPE with fasting labs for Dr. Copland. 

## 2019-11-30 ENCOUNTER — Other Ambulatory Visit: Payer: Self-pay | Admitting: Family Medicine

## 2019-12-04 ENCOUNTER — Other Ambulatory Visit: Payer: Self-pay | Admitting: Family Medicine

## 2019-12-20 ENCOUNTER — Other Ambulatory Visit: Payer: Self-pay | Admitting: Family Medicine

## 2019-12-27 ENCOUNTER — Other Ambulatory Visit: Payer: Self-pay | Admitting: Family Medicine

## 2020-01-01 ENCOUNTER — Ambulatory Visit (INDEPENDENT_AMBULATORY_CARE_PROVIDER_SITE_OTHER): Payer: 59 | Admitting: Nurse Practitioner

## 2020-01-01 ENCOUNTER — Encounter: Payer: Self-pay | Admitting: Obstetrics & Gynecology

## 2020-01-01 ENCOUNTER — Ambulatory Visit (INDEPENDENT_AMBULATORY_CARE_PROVIDER_SITE_OTHER): Payer: 59 | Admitting: Obstetrics & Gynecology

## 2020-01-01 ENCOUNTER — Other Ambulatory Visit: Payer: Self-pay

## 2020-01-01 ENCOUNTER — Encounter: Payer: Self-pay | Admitting: Nurse Practitioner

## 2020-01-01 VITALS — BP 110/70 | HR 80 | Ht 64.0 in | Wt 172.0 lb

## 2020-01-01 VITALS — BP 120/80 | Ht 64.0 in | Wt 173.0 lb

## 2020-01-01 DIAGNOSIS — Z1231 Encounter for screening mammogram for malignant neoplasm of breast: Secondary | ICD-10-CM

## 2020-01-01 DIAGNOSIS — Z01419 Encounter for gynecological examination (general) (routine) without abnormal findings: Secondary | ICD-10-CM

## 2020-01-01 DIAGNOSIS — K600 Acute anal fissure: Secondary | ICD-10-CM | POA: Diagnosis not present

## 2020-01-01 DIAGNOSIS — Z1211 Encounter for screening for malignant neoplasm of colon: Secondary | ICD-10-CM

## 2020-01-01 DIAGNOSIS — R232 Flushing: Secondary | ICD-10-CM | POA: Diagnosis not present

## 2020-01-01 DIAGNOSIS — L900 Lichen sclerosus et atrophicus: Secondary | ICD-10-CM | POA: Diagnosis not present

## 2020-01-01 MED ORDER — CLONIDINE HCL 0.1 MG PO TABS: 0.1000 mg | ORAL_TABLET | Freq: Every day | ORAL | 11 refills | Status: DC

## 2020-01-01 MED ORDER — CLOBETASOL PROPIONATE 0.05 % EX CREA: TOPICAL_CREAM | CUTANEOUS | 3 refills | Status: DC

## 2020-01-01 MED ORDER — AMBULATORY NON FORMULARY MEDICATION: 0 refills | Status: DC

## 2020-01-01 NOTE — Progress Notes (Signed)
HPI:      [name] is a 64 y.o. G4P0010 who LMP was in the past, she presents today for her annual examination.  The patient has no complaints today, other than occas flare of LS, and dyspareunia is worse during these times (minimal other times).  Feels more like she is now having hot flashes and keeping thermostat low, compared to previous years. The patient is sexually active. Herlast pap: was normal and last mammogram: approximate date 2019 and was normal.  The patient does perform self breast exams.  There is notable family history of breast or ovarian cancer in her family. The patient is not taking hormone replacement therapy. Patient denies post-menopausal vaginal bleeding.   The patient has regular exercise: yes. The patient denies current symptoms of depression.    GYN Hx: Last Colonoscopy:2 years ago. Normal.  Last DEXA: never ago.    PMHx: Past Medical History:  Diagnosis Date  . Asthma    does not use an inhaler.  Dx over 20 yrsago.  . Blood transfusion without reported diagnosis    2016 Kidney stone removal  . CAD (coronary artery disease)    a. 01/2010 Cath: mild atherosclerosis in the mid LCX after takeoff of OM1, mild diff LAD dzs;  b. 07/2013 MV: EF 67%, no ischemia.  . Depression   . Heart murmur   . Hemorrhoids   . History of pneumonia 1969  . HLD (hyperlipidemia)   . Hypothyroidism   . IBS (irritable bowel syndrome)   . Migraine headache   . Myocarditis ([place])    a. 01/2010 presented w/ c/p and elev trop-->Cath: mild atherosclerosis in the mid LCX after takeoff of OM1, mild diff LAD dzs;  b. 03/2010 Echo: EF 60-65%, DD, mild LVH;  c. 07/2011 Echo: EF 55-60%, mild LVH, no rwma.  . Prediabetes   . Sleep apnea    has c-pap.  wears off and on - not lately 01-01-18 per reported   Past Surgical History:  Procedure Laterality Date  . APPENDECTOMY  1974  . BREAST EXCISIONAL BIOPSY Right 1988   neg  . CARDIAC CATHETERIZATION  8/11   no significant -[facility]- Dr  Rockey Situ,  . CHOLECYSTECTOMY  2002  . COLONOSCOPY  2013  . CYST EXCISION    . NEPHROLITHOTOMY Right 08/20/2014   Procedure: RIGHT PERCUTANEOUS NEPHROLITHOTOMY ;  Surgeon: Malka So, MD;  Location: [facility];  Service: Urology;  Laterality: Right;  . POLYPECTOMY    . TOTAL ABDOMINAL HYSTERECTOMY W/ BILATERAL SALPINGOOPHORECTOMY  2000   no ovaries, uterus, or cervix.   Family History  Problem Relation Age of Onset  . Coronary artery disease Mother   . Diabetes Mother   . ALS Mother   . Hyperlipidemia Father   . Breast cancer Paternal Grandmother   . Lung cancer Paternal Grandfather        CAD  . Other Daughter        gluten  . Down syndrome Son   . Colon cancer Maternal Grandfather        dx early 71's   . Breast cancer Sister 20  . Esophageal cancer Neg Hx   . Liver cancer Neg Hx   . Pancreatic cancer Neg Hx   . Rectal cancer Neg Hx   . Stomach cancer Neg Hx    Social History   Tobacco Use  . Smoking status: Former Smoker    Packs/day: 0.35    Years: 35.00    Pack years:  12.25    Types: Cigarettes    Quit date: 09/25/2008    Years since quitting: 11.2  . Smokeless tobacco: Never Used  . Tobacco comment: quit in 2007-2008, social smoker  Vaping Use  . Vaping Use: Never used  Substance Use Topics  . Alcohol use: Yes    Comment: rarely  . Drug use: No    Current Outpatient Medications:  .  AMBULATORY NON FORMULARY MEDICATION, Diltiazem2% lidocaine 2% fissure cream. Apply a small amount inside the anal opening and to the external anal area three times a day for 6 weeks., Disp: 30 g, Rfl: 0 .  aspirin EC 81 MG tablet, Take 81 mg by mouth daily., Disp: , Rfl:  .  cyclobenzaprine (FLEXERIL) 10 MG tablet, TAKE 1 TABLET (10 MG TOTAL) BY MOUTH 2 (TWO) TIMES DAILY AS NEEDED (PAIN)., Disp: , Rfl:  .  fenofibrate 160 MG tablet, TAKE ONE TABLET BY MOUTH DAILY, Disp: 30 tablet, Rfl: 0 .  gabapentin (NEURONTIN) 300 MG capsule, Take 1 capsule (300 mg total) by mouth at bedtime., Disp:  90 capsule, Rfl: 3 .  Galcanezumab-gnlm (EMGALITY [place]), Inject into the skin every 30 (thirty) days., Disp: , Rfl:  .  glucose blood (ACCU-CHEK AVIVA) test strip, Use to check blood sugar once daily, Disp: 100 each, Rfl: 3 .  levothyroxine (SYNTHROID) 75 MCG tablet, TAKE ONE TABLET BY MOUTH EVERY MORNING WITH BREAKFAST, Disp: 30 tablet, Rfl: 0 .  magnesium gluconate (MAGONATE) 500 MG tablet, Take 500 mg by mouth daily. , Disp: , Rfl:  .  metoprolol succinate (TOPROL-XL) 50 MG 24 hr tablet, TAKE ONE TABLET BY MOUTH DAILY WITH OR IMMEDIATELY FOLLOWING A MEAL, Disp: 90 tablet, Rfl: 0 .  Multiple Vitamins-Minerals (ONE-A-DAY WOMENS PETITES PO), Take 2 tablets by mouth daily., Disp: , Rfl:  .  nitroGLYCERIN (NITROSTAT) 0.4 MG SL tablet, 1 tablet under tongue at onset of chest pain; you may repeat every 5 minutes for up to 3 doses., Disp: 25 tablet, Rfl: 3 .  pantoprazole (PROTONIX) 40 MG tablet, TAKE ONE TABLET BY MOUTH DAILY, Disp: 90 tablet, Rfl: 1 .  pravastatin (PRAVACHOL) 40 MG tablet, TAKE ONE TABLET BY MOUTH AT BEDTIME, Disp: 90 tablet, Rfl: 0 .  rizatriptan (MAXALT) 10 MG tablet, , Disp: , Rfl:  .  sertraline (ZOLOFT) 50 MG tablet, TAKE ONE TABLET BY MOUTH DAILY, Disp: 90 tablet, Rfl: 0 .  clobetasol cream (TEMOVATE) 0.05 %, APPLY TWO TIMES A DAY FOR 2 WEEKS THEN USE AS NEEDED FOR LICHEN SCLEROSIS, Disp: 45 g, Rfl: 3 .  cloNIDine (CATAPRES) 0.1 MG tablet, Take 1 tablet (0.1 mg total) by mouth daily., Disp: 30 tablet, Rfl: 11 Allergies: Pseudoeph-doxylamine-dm-apap, Gluten, and Penicillins  Review of Systems  Constitutional: Negative for chills, fever and malaise/fatigue.  HENT: Negative for congestion, sinus pain and sore throat.   Eyes: Negative for blurred vision and pain.  Respiratory: Negative for cough and wheezing.   Cardiovascular: Negative for chest pain and leg swelling.  Gastrointestinal: Negative for abdominal pain, constipation, diarrhea, heartburn, nausea and vomiting.    Genitourinary: Negative for dysuria, frequency, hematuria and urgency.  Musculoskeletal: Negative for back pain, joint pain, myalgias and neck pain.  Skin: Negative for itching and rash.  Neurological: Negative for dizziness, tremors and weakness.  Endo/Heme/Allergies: Does not bruise/bleed easily.  Psychiatric/Behavioral: Negative for depression. The patient is not nervous/anxious and does not have insomnia.     Objective: BP 120/80   Ht 5\' 4"  (1.626 m)   Wt  173 lb (78.5 kg)   BMI 29.70 kg/m   Filed Weights   01/01/20 1532  Weight: 173 lb (78.5 kg)   Body mass index is 29.7 kg/m. Physical Exam Constitutional:      General: She is not in acute distress.    Appearance: She is well-developed.  Genitourinary:     Pelvic exam was performed with patient supine.     Vagina and rectum normal.     No lesions in the vagina.     No vaginal bleeding.     No right or left adnexal mass present.     Right adnexa not tender.     Left adnexa not tender.     Genitourinary Comments: Pale changes c/w lichen on vulva Absent Uterus Absent cervix Vaginal cuff well healed  HENT:     Head: Normocephalic and atraumatic. No laceration.     Right Ear: Hearing normal.     Left Ear: Hearing normal.     Mouth/Throat:     Pharynx: Uvula midline.  Eyes:     Pupils: Pupils are equal, round, and reactive to light.  Neck:     Thyroid: No thyromegaly.  Cardiovascular:     Rate and Rhythm: Normal rate and regular rhythm.     Heart sounds: No murmur heard.  No friction rub. No gallop.   Pulmonary:     Effort: Pulmonary effort is normal. No respiratory distress.     Breath sounds: Normal breath sounds. No wheezing.  Chest:     Breasts:        Right: No mass, skin change or tenderness.        Left: No mass, skin change or tenderness.  Abdominal:     General: Bowel sounds are normal. There is no distension.     Palpations: Abdomen is soft.     Tenderness: There is no abdominal tenderness. There  is no rebound.  Musculoskeletal:        General: Normal range of motion.     Cervical back: Normal range of motion and neck supple.  Neurological:     Mental Status: She is alert and oriented to person, place, and time.     Cranial Nerves: No cranial nerve deficit.  Skin:    General: Skin is warm and dry.  Psychiatric:        Judgment: Judgment normal.  Vitals reviewed.     Assessment: Annual Exam 1. Women's annual routine gynecological examination   2. Lichen sclerosus   3. Encounter for screening mammogram for malignant neoplasm of breast   4. Hot flashes   5. Screen for colon cancer     Plan:            1.  Cervical Screening-  Pap smear not performed  2. Breast screening- Exam annually and mammogram scheduled  3. Colonoscopy every 10 years, Hemoccult testing after age 23  4. Labs managed by PCP  5. Counseling for hormonal therapy: none Clonidine for hot flashes May need PCP work up for alternative causes to hot flashes (as she has already been through menopause years ago)              6. FRAX - FRAX score for assessing the 10 year probability for fracture calculated and discussed today.  Based on age and score today, DEXA is not currently scheduled.   7. Clobetasol for Lichen Sclerosus, continue   8. Dyspareunia, mostly related to LS. No longer use Intr[place]osa    F/U  Return in about 1 year (around 12/31/2020) for Annual.  Barnett Applebaum, MD, [facility], [facility] 01/01/2020  4:15 PM

## 2020-01-01 NOTE — Patient Instructions (Addendum)
If you are age 64 or older, your body mass index should be between 23-30. Your Body mass index is 29.52 kg/m. If this is out of the aforementioned range listed, please consider follow up with your Primary Care Provider.  If you are age 39 or younger, your body mass index should be between 19-25. Your Body mass index is 29.52 kg/m. If this is out of the aformentioned range listed, please consider follow up with your Primary Care Provider.   [facility]'s information is below: Address: [address] [place], [place] [address]  [phone]  *Please DO NOT go directly from our office to pick up this medication! Give the pharmacy 1 day to process the prescription as this is compounded and takes time to make.    Take Miralax 1 capful mixed in 8 ounces of water at bed time for constipation as tolerated.   How to Take a [employer] A sitz bath is a warm water bath that may be used to care for your rectum, genital area, or the area between your rectum and genitals (perineum). For a sitz bath, the water only comes up to your hips and covers your buttocks. A sitz bath may done at home in a bathtub or with a portable sitz bath that fits over the toilet. Your health care provider may recommend a sitz bath to help:  Relieve pain and discomfort after delivering a baby.  Relieve pain and itching from hemorrhoids or anal fissures.  Relieve pain after certain surgeries.  Relax muscles that are sore or tight. How to take a sitz bath Take 3-4 sitz baths a day, or as many as told by your health care provider. Bathtub sitz bath To take a sitz bath in a bathtub: 1. Partially fill a bathtub with warm water. The water should be deep enough to cover your hips and buttocks when you are sitting in the tub. 2. If your health care provider told you to put medicine in the water, follow his or her instructions. 3. Sit in the water. 4. Open the tub drain a little, and leave it open during your  bath. 5. Turn on the warm water again, enough to replace the water that is draining out. Keep the water running throughout your bath. This helps keep the water at the right level and the right temperature. 6. Soak in the water for 15-20 minutes, or as long as told by your health care provider. 7. When you are done, be careful when you stand up. You may feel dizzy. 8. After the sitz bath, pat yourself dry. Do not rub your skin to dry it.  Over-the-toilet sitz bath To take a sitz bath with an over-the-toilet basin: 1. Follow the manufacturer's instructions. 2. Fill the basin with warm water. 3. If your health care provider told you to put medicine in the water, follow his or her instructions. 4. Sit on the seat. Make sure the water covers your buttocks and perineum. 5. Soak in the water for 15-20 minutes, or as long as told by your health care provider. 6. After the sitz bath, pat yourself dry. Do not rub your skin to dry it. 7. Clean and dry the basin between uses. 8. Discard the basin if it cracks, or according to the manufacturer's instructions. Contact a health care provider if:  Your symptoms get worse. Do not continue with sitz baths if your symptoms get worse.  You have new symptoms. If this happens, do not continue with  sitz baths until you talk with your health care provider. Summary  A sitz bath is a warm water bath in which the water only comes up to your hips and covers your buttocks.  A sitz bath may help relieve itching, relieve pain, and relax muscles that are sore or tight in the lower part of your body, including your genital area.  Take 3-4 sitz baths a day, or as many as told by your health care provider. Soak in the water for 15-20 minutes.  Do not continue with sitz baths if your symptoms get worse. This information is not intended to replace advice given to you by your health care provider. Make sure you discuss any questions you have with your health care  provider. Document Revised: 11/04/2018 Document Reviewed: 06/07/2017 Elsevier Patient Education  [employer].  Due to recent changes in healthcare laws, you may see the results of your imaging and laboratory studies on MyChart before your provider has had a chance to review them.  We understand that in some cases there may be results that are confusing or concerning to you. Not all laboratory results come back in the same time frame and the provider may be waiting for multiple results in order to interpret others.  Please give [place] 48 hours in order for your provider to thoroughly review all the results before contacting the office for clarification of your results.

## 2020-01-01 NOTE — Progress Notes (Signed)
01/01/2020 [place] [id number] Oct 19, 1955   Chief Complaint:  Anal fissure   History of Present Illness: Brandi Mahoney is a 64 year old female with a past medical history of depression, CAD, remote asthma, hypothyroidism, migraine headaches and colon polyps. She presents today for further evaluation for chronic anal pain. She was diagnosed with an anal fissure in 2019 treated with Diltiazem/Lidocaine fissure cream which significantly reduced her symptoms but her anal pain never completed abated. She has anal pain associated with most bowel movements. She sees bright red blood on the toilet tissue approximately 5 to6 times monthly. She has infrequent constipation. About 2 months ago, she passed a large difficult stool which felt stuck then passed with a lot of anal pain. She is currently passing a normal soft brown BM daily. She reports having a gluten intolerance. Never been formally diagnosed with Celiac disease. She maintains a gluten free diet. If she inadvertently ingests gluten she develops a "grumpy stomach".  She underwent a colonoscopy 01/02/2018 which confirmed an anal fissure, internal hemorrhoids  and 2 tubular adenomatous/sessile serrated polyps were removed from the transverse and ascending colon. She takes ASA 81mg  daily. She is taking Pantoprazole 40mg  daily. No current GERD/dysphagia symptoms. She underwent an EGD in 1996 due to having dysphagia, the esophagus appeared normal but was dilated.   CBC Latest Ref Rng & Units 11/01/2018 09/23/2018 05/23/2017  WBC 4.0 - 10.5 K/uL 9.4 8.2 7.2  Hemoglobin 12.0 - 15.0 g/dL 12.4 13.0 13.3  Hematocrit 36 - 46 % 38.2 40.2 40.9  Platelets 150 - 400 K/uL 287 250.0 272.0    Colonoscopy 01/02/2018 by Dr. Fuller Plan: - Anal fissure found on perianal exam. - One 9 mm polyp in the ascending colon, removed with a hot snare. Resected and retrieved. - Two 6 to 7 mm polyps in the transverse colon, removed with a cold snare. Resected  and retrieved. - Internal hemorrhoids. - The examination was otherwise normal on direct and retroflexion views. Surgical [P], transverse and ascending, polyp (3) - TUBULAR ADENOMA WITHOUT HIGH GRADE DYSPLASIA OR MALIGNANCY. - SESSILE SERRATED POLYP(S) WITHOUT CYTOLOGIC DYSPLASIA.  Current Medications, Allergies, Past Medical History, Past Surgical History, Family History and Social History were reviewed in [employer] record.   Physical Exam: BP 110/70   Pulse 80   Ht 5\' 4"  (1.626 m)   Wt 172 lb (78 kg)   BMI 29.52 kg/m  General: Well developed 64 year old female in no acute distress. Head: Normocephalic and atraumatic. Eyes: No scleral icterus. Conjunctiva pink . Ears: Normal auditory acuity. Mouth: Dentition intact. No ulcers or lesions.  Lungs: Clear throughout to auscultation. Heart: Regular rate and rhythm, no murmur. Abdomen: Soft, nontender and nondistended. No masses or hepatomegaly. Normal bowel sounds x 4 quadrants.  Rectal: Small posterior fissure, tender without active bleeding. No abscess. Internal hemorrhoids without prolapse.  Musculoskeletal: Symmetrical with no gross deformities. Extremities: No edema. Neurological: Alert oriented x 4. No focal deficits.  Psychological: Alert and cooperative. Normal mood and affect  Assessment and Recommendations:  95. 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 History of colon polyps -Next colonoscopy due 12/2022  3. History of CAD

## 2020-01-01 NOTE — Patient Instructions (Addendum)
Mammogram every year    Call [phone] to schedule at [facility] Colonoscopy every 10 years Labs yearly (with PCP)  Thank you for choosing [facility]. As part of our ongoing efforts to improve patient experience, we would appreciate your feedback. Please fill out the short survey that you will receive by mail or MyChart. Your opinion is important to [place]!  Clonidine tablets What is this medicine? CLONIDINE (KLOE ni deen) is used to treat high blood pressure. This medicine may be used for other purposes; ask your health care provider or pharmacist if you have questions. COMMON BRAND NAME(S): Catapres What should I tell my health care provider before I take this medicine? They need to know if you have any of these conditions:  kidney disease  an unusual or allergic reaction to clonidine, other medicines, foods, dyes, or preservatives  pregnant or trying to get pregnant  breast-feeding How should I use this medicine? Take this medicine by mouth with a glass of water. Follow the directions on the prescription label. Take your doses at regular intervals. Do not take your medicine more often than directed. Do not suddenly stop taking this medicine. You must gradually reduce the dose or you may get a dangerous increase in blood pressure. Ask your doctor or health care professional for advice. Talk to your pediatrician regarding the use of this medicine in children. Special care may be needed. Overdosage: If you think you have taken too much of this medicine contact a poison control center or emergency room at once. NOTE: This medicine is only for you. Do not share this medicine with others. What if I miss a dose? If you miss a dose, take it as soon as you can. If it is almost time for your next dose, take only that dose. Do not take double or extra doses. What may interact with this medicine? Do not take this medicine with any of the following medications:  MAOIs like Carbex, Eldepryl,  Marplan, Nardil, and Parnate This medicine may also interact with the following medications:  barbiturate medicines for inducing sleep or treating seizures like phenobarbital  certain medicines for blood pressure, heart disease, irregular heart beat  certain medicines for depression, anxiety, or psychotic disturbances  prescription pain medicines This list may not describe all possible interactions. Give your health care provider a list of all the medicines, herbs, non-prescription drugs, or dietary supplements you use. Also tell them if you smoke, drink alcohol, or use illegal drugs. Some items may interact with your medicine. What should I watch for while using this medicine? Visit your doctor or health care professional for regular checks on your progress. Check your heart rate and blood pressure regularly while you are taking this medicine. Ask your doctor or health care professional what your heart rate should be and when you should contact him or her. You may get drowsy or dizzy. Do not drive, use machinery, or do anything that needs mental alertness until you know how this medicine affects you. To avoid dizzy or fainting spells, do not stand or sit up quickly, especially if you are an older person. Alcohol can make you more drowsy and dizzy. Avoid alcoholic drinks. Your mouth may get dry. Chewing sugarless gum or sucking hard candy, and drinking plenty of water will help. Do not treat yourself for coughs, colds, or pain while you are taking this medicine without asking your doctor or health care professional for advice. Some ingredients may increase your blood pressure. If you are going  to have surgery tell your doctor or health care professional that you are taking this medicine. What side effects may I notice from receiving this medicine? Side effects that you should report to your doctor or health care professional as soon as possible:  allergic reactions like skin rash, itching or  hives, swelling of the face, lips, or tongue  anxiety, nervousness  chest pain  depression  fast, irregular heartbeat  swelling of feet or legs  unusually weak or tired Side effects that usually do not require medical attention (report to your doctor or health care professional if they continue or are bothersome):  change in sex drive or performance  constipation  headache This list may not describe all possible side effects. Call your doctor for medical advice about side effects. You may report side effects to FDA at 1-800-FDA-1088. Where should I keep my medicine? Keep out of the reach of children. Store at room temperature between 15 and 30 degrees C (59 and 86 degrees F). Protect from light. Keep container tightly closed. Throw away any unused medicine after the expiration date. NOTE: This sheet is a summary. It may not cover all possible information. If you have questions about this medicine, talk to your doctor, pharmacist, or health care provider.  2020 Elsevier/Gold Standard (2010-11-30 13:01:28)

## 2020-01-02 ENCOUNTER — Encounter: Payer: Self-pay | Admitting: Family Medicine

## 2020-01-05 NOTE — Progress Notes (Signed)
Reviewed and agree with management plan.  Trong Gosling T. Latoyia Tecson, MD FACG [facility]  

## 2020-01-10 ENCOUNTER — Other Ambulatory Visit: Payer: Self-pay | Admitting: Family Medicine

## 2020-01-19 ENCOUNTER — Telehealth: Payer: Self-pay

## 2020-01-19 NOTE — Telephone Encounter (Signed)
Pt calling; rx is not at the pharm; she doesn't remember the name of medication; it was hormonal.  [phone]  Pt adv clonidine was called in for her hot flashes 7/15.  To call pharm and ask them to get it ready for her.

## 2020-01-20 ENCOUNTER — Other Ambulatory Visit: Payer: Self-pay | Admitting: Family Medicine

## 2020-01-20 DIAGNOSIS — E119 Type 2 diabetes mellitus without complications: Secondary | ICD-10-CM

## 2020-01-20 DIAGNOSIS — Z79899 Other long term (current) drug therapy: Secondary | ICD-10-CM

## 2020-01-20 DIAGNOSIS — E559 Vitamin D deficiency, unspecified: Secondary | ICD-10-CM

## 2020-01-20 DIAGNOSIS — E785 Hyperlipidemia, unspecified: Secondary | ICD-10-CM

## 2020-01-20 DIAGNOSIS — E039 Hypothyroidism, unspecified: Secondary | ICD-10-CM

## 2020-01-23 ENCOUNTER — Other Ambulatory Visit: Payer: Self-pay

## 2020-01-23 ENCOUNTER — Other Ambulatory Visit (INDEPENDENT_AMBULATORY_CARE_PROVIDER_SITE_OTHER): Payer: 59

## 2020-01-23 DIAGNOSIS — Z79899 Other long term (current) drug therapy: Secondary | ICD-10-CM

## 2020-01-23 DIAGNOSIS — E559 Vitamin D deficiency, unspecified: Secondary | ICD-10-CM

## 2020-01-23 DIAGNOSIS — E039 Hypothyroidism, unspecified: Secondary | ICD-10-CM

## 2020-01-23 DIAGNOSIS — E785 Hyperlipidemia, unspecified: Secondary | ICD-10-CM | POA: Diagnosis not present

## 2020-01-23 DIAGNOSIS — E119 Type 2 diabetes mellitus without complications: Secondary | ICD-10-CM

## 2020-01-23 LAB — CBC WITH DIFFERENTIAL/PLATELET
Basophils Absolute: 0 10*3/uL (ref 0.0–0.1)
Basophils Relative: 0.3 % (ref 0.0–3.0)
Eosinophils Absolute: 0.1 10*3/uL (ref 0.0–0.7)
Eosinophils Relative: 1.9 % (ref 0.0–5.0)
HCT: 39.3 % (ref 36.0–46.0)
Hemoglobin: 12.8 g/dL (ref 12.0–15.0)
Lymphocytes Relative: 37.5 % (ref 12.0–46.0)
Lymphs Abs: 2.7 10*3/uL (ref 0.7–4.0)
MCHC: 32.6 g/dL (ref 30.0–36.0)
MCV: 82.7 fl (ref 78.0–100.0)
Monocytes Absolute: 0.5 10*3/uL (ref 0.1–1.0)
Monocytes Relative: 7.6 % (ref 3.0–12.0)
Neutro Abs: 3.8 10*3/uL (ref 1.4–7.7)
Neutrophils Relative %: 52.7 % (ref 43.0–77.0)
Platelets: 273 10*3/uL (ref 150.0–400.0)
RBC: 4.75 Mil/uL (ref 3.87–5.11)
RDW: 13.5 % (ref 11.5–15.5)
WBC: 7.2 10*3/uL (ref 4.0–10.5)

## 2020-01-23 LAB — LIPID PANEL
Cholesterol: 131 mg/dL (ref 0–200)
HDL: 40.5 mg/dL (ref >39.00)
NonHDL: 90.94
Total CHOL/HDL Ratio: 3
Triglycerides: 225 mg/dL — ABNORMAL HIGH (ref 0.0–149.0)
VLDL: 45 mg/dL — ABNORMAL HIGH (ref 0.0–40.0)

## 2020-01-23 LAB — HEPATIC FUNCTION PANEL
ALT: 31 U/L (ref 0–35)
AST: 29 U/L (ref 0–37)
Albumin: 4.6 g/dL (ref 3.5–5.2)
Alkaline Phosphatase: 59 U/L (ref 39–117)
Bilirubin, Direct: 0.1 mg/dL (ref 0.0–0.3)
Total Bilirubin: 0.6 mg/dL (ref 0.2–1.2)
Total Protein: 6.9 g/dL (ref 6.0–8.3)

## 2020-01-23 LAB — BASIC METABOLIC PANEL
BUN: 17 mg/dL (ref 6–23)
CO2: 30 mEq/L (ref 19–32)
Calcium: 9.8 mg/dL (ref 8.4–10.5)
Chloride: 102 mEq/L (ref 96–112)
Creatinine, Ser: 0.74 mg/dL (ref 0.40–1.20)
GFR: 79.1 mL/min (ref >60.00)
Glucose, Bld: 112 mg/dL — ABNORMAL HIGH (ref 70–99)
Potassium: 4.2 mEq/L (ref 3.5–5.1)
Sodium: 137 mEq/L (ref 135–145)

## 2020-01-23 LAB — MICROALBUMIN / CREATININE URINE RATIO
Creatinine,U: 91.5 mg/dL
Microalb Creat Ratio: 0.8 mg/g (ref 0.0–30.0)
Microalb, Ur: <0.7 mg/dL (ref 0.0–1.9)

## 2020-01-23 LAB — VITAMIN D 25 HYDROXY (VIT D DEFICIENCY, FRACTURES): VITD: 31.92 ng/mL (ref 30.00–100.00)

## 2020-01-23 LAB — LDL CHOLESTEROL, DIRECT: Direct LDL: 68 mg/dL

## 2020-01-23 LAB — TSH: TSH: 6.55 u[IU]/mL — ABNORMAL HIGH (ref 0.35–4.50)

## 2020-01-23 LAB — HEMOGLOBIN A1C: Hgb A1c MFr Bld: 6.9 % — ABNORMAL HIGH (ref 4.6–6.5)

## 2020-01-23 LAB — T3, FREE: T3, Free: 3.4 pg/mL (ref 2.3–4.2)

## 2020-01-23 LAB — T4, FREE: Free T4: 0.99 ng/dL (ref 0.60–1.60)

## 2020-01-28 ENCOUNTER — Ambulatory Visit (INDEPENDENT_AMBULATORY_CARE_PROVIDER_SITE_OTHER): Payer: 59 | Admitting: Family Medicine

## 2020-01-28 ENCOUNTER — Encounter: Payer: Self-pay | Admitting: Family Medicine

## 2020-01-28 ENCOUNTER — Other Ambulatory Visit: Payer: Self-pay

## 2020-01-28 VITALS — BP 110/68 | HR 68 | Temp 98.0°F | Ht 64.0 in | Wt 172.8 lb

## 2020-01-28 DIAGNOSIS — Z0000 Encounter for general adult medical examination without abnormal findings: Secondary | ICD-10-CM | POA: Diagnosis not present

## 2020-01-28 NOTE — Progress Notes (Signed)
Brandi Pellegrin T. Keaton Stirewalt, MD, [place] at [facility] [place], [address]  Phone: [phone]  FAX: [phone]  [name] - 64 y.o. female  MRN [id number]  Date of Birth: April 14, 1956  Date: 01/28/2020  PCP: Owens Loffler, MD  Referral: Owens Loffler, MD  Chief Complaint  Patient presents with  . Annual Exam    This visit occurred during the SARS-CoV-2 public health emergency.  Safety protocols were in place, including screening questions prior to the visit, additional usage of staff PPE, and extensive cleaning of exam room while observing appropriate contact time as indicated for disinfecting solutions.   Patient Care Team: Owens Loffler, MD as PCP - General Byrnett, Forest Gleason, MD (General Surgery) Ammie Dalton, Okey Regal, MD (Unknown Physician Specialty) Subjective:   Brandi Mahoney is a 64 y.o. pleasant patient who presents with the following:  Health Maintenance Summary Reviewed and updated, unless pt declines services.  Tobacco History Reviewed. Non-smoker Alcohol: No concerns, no excessive use Exercise Habits: Some activity, rec at least 30 mins 5 times a week STD concerns: none Drug Use: None Lumps or breast concerns: no  Foot Eye - pending Mammo pending  S/p hystb  Diabetes Mellitus: Tolerating Medications: yes Compliance with diet: fair, Body mass index is 29.65 kg/m. Exercise: minimal / intermittent Avg blood sugars at home: not checking Foot problems: none Hypoglycemia: none No nausea, vomitting, blurred vision, polyuria.  Lab Results  Component Value Date   HGBA1C 6.9 (H) 01/23/2020   HGBA1C 6.6 (H) 09/23/2018   HGBA1C 6.5 05/02/2016   Lab Results  Component Value Date   MICROALBUR <0.7 01/23/2020   LDLCALC 62 09/23/2018   CREATININE 0.74 01/23/2020    Wt Readings from Last 3 Encounters:  01/28/20 172 lb 12 oz (78.4 kg)  01/01/20  173 lb (78.5 kg)  01/01/20 172 lb (78 kg)     Health Maintenance  Topic Date Due  . OPHTHALMOLOGY EXAM  Never done  . TETANUS/TDAP  05/25/2019  . MAMMOGRAM  10/11/2019  . INFLUENZA VACCINE  01/18/2020  . HEMOGLOBIN A1C  07/25/2020  . FOOT EXAM  01/27/2021  . COLONOSCOPY  01/03/2023  . PNEUMOCOCCAL POLYSACCHARIDE VACCINE AGE 37-64 HIGH RISK  Completed  . COVID-19 Vaccine  Completed  . Hepatitis C Screening  Completed  . HIV Screening  Completed    Immunization History  Administered Date(s) Administered  . Influenza Split 03/19/2013  . Influenza,inj,Quad PF,6+ Mos 04/20/2017, 04/04/2018, 04/07/2019  . Influenza-Unspecified 03/20/2015  . Moderna SARS-COVID-2 Vaccination 08/13/2019, 09/12/2019  . Pneumococcal Polysaccharide-23 03/02/2016  . Td 05/24/2009  . Zoster Recombinat (Shingrix) 08/16/2018, 01/10/2019   Patient Active Problem List   Diagnosis Date Noted  . Coronary artery disease involving native coronary artery of native heart with angina pectoris [facility])     Priority: High  . Mixed hyperlipidemia 04/01/2010    Priority: High  . HYPERTRIGLYCERIDEMIA 07/21/2009    Priority: High  . Hypothyroidism in adult 09/08/2008    Priority: High  . Obstructive sleep apnea 09/25/2013    Priority: Medium  . Tobacco abuse, in remission 09/19/2013    Priority: Medium  . Major depressive disorder, recurrent episode, in full remission ([place]) 09/08/2008    Priority: Medium  . Lichen sclerosus 51/76/1607  . Type 2 diabetes, diet controlled ([place])   . Anxiety   . IBS (irritable bowel syndrome) 09/26/2012  . PARESTHESIA, HANDS 06/01/2010  . 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 ASTHMA 09/08/2008    Past Medical History:  Diagnosis Date  . Asthma    does not use an inhaler.  Dx over 20 yrsago.  . Blood transfusion without reported diagnosis    2016 Kidney stone removal  . CAD (coronary artery disease)    a. 01/2010 Cath: mild atherosclerosis in the mid LCX after takeoff of OM1,  mild diff LAD dzs;  b. 07/2013 MV: EF 67%, no ischemia.  . Depression   . Heart murmur   . Hemorrhoids   . History of pneumonia 1969  . HLD (hyperlipidemia)   . Hypothyroidism   . IBS (irritable bowel syndrome)   . Migraine headache   . Myocarditis ([place])    a. 01/2010 presented w/ c/p and elev trop-->Cath: mild atherosclerosis in the mid LCX after takeoff of OM1, mild diff LAD dzs;  b. 03/2010 Echo: EF 60-65%, DD, mild LVH;  c. 07/2011 Echo: EF 55-60%, mild LVH, no rwma.  . Prediabetes   . Sleep apnea    has c-pap.  wears off and on - not lately 01-01-18 per reported    Past Surgical History:  Procedure Laterality Date  . APPENDECTOMY  1974  . BREAST EXCISIONAL BIOPSY Right 1988   neg  . CARDIAC CATHETERIZATION  8/11   no significant -[facility] Dr Rockey Situ,  . CHOLECYSTECTOMY  2002  . COLONOSCOPY  2013  . CYST EXCISION    . NEPHROLITHOTOMY Right 08/20/2014   Procedure: RIGHT PERCUTANEOUS NEPHROLITHOTOMY ;  Surgeon: Malka So, MD;  Location: [facility];  Service: Urology;  Laterality: Right;  . POLYPECTOMY    . TOTAL ABDOMINAL HYSTERECTOMY W/ BILATERAL SALPINGOOPHORECTOMY  2000   no ovaries, uterus, or cervix.    Family History  Problem Relation Age of Onset  . Coronary artery disease Mother   . Diabetes Mother   . ALS Mother   . Hyperlipidemia Father   . Breast cancer Paternal Grandmother   . Lung cancer Paternal Grandfather        CAD  . Other Daughter        gluten  . Down syndrome Son   . Colon cancer Maternal Grandfather        dx early 67's   . Breast cancer Sister 38  . Esophageal cancer Neg Hx   . Liver cancer Neg Hx   . Pancreatic cancer Neg Hx   . Rectal cancer Neg Hx   . Stomach cancer Neg Hx     Past Medical History, Surgical History, Social History, Family History, Problem List, Medications, and Allergies have been reviewed and updated if relevant.  Review of Systems: Pertinent positives are listed above.  Otherwise, a full 14 point review of systems has  been done in full and it is negative except where it is noted positive.  Objective:   BP 110/68   Pulse 68   Temp 98 F (36.7 C) (Skin)   Ht 5\' 4"  (1.626 m)   Wt 172 lb 12 oz (78.4 kg)   BMI 29.65 kg/m  Ideal Body Weight: Weight in (lb) to have BMI = 25: 145.3 No exam data present Depression screen [facility] 2/9 01/28/2020 05/23/2017  Decreased Interest 0 0  Down, Depressed, Hopeless 0 0  PHQ - 2 Score 0 0     GEN: well developed, well nourished, no acute distress Eyes: conjunctiva and lids normal, PERRLA, EOMI ENT: TM clear, nares clear, oral exam WNL Neck: supple, no lymphadenopathy, no thyromegaly, no JVD Pulm:  clear to auscultation and percussion, respiratory effort normal CV: regular rate and rhythm, S1-S2, no murmur, rub or gallop, no bruits Chest: no scars, masses, no lumps BREAST: breast exam declined GI: soft, non-tender; no hepatosplenomegaly, masses; active bowel sounds all quadrants GU: GU exam declined Lymph: no cervical, axillary or inguinal adenopathy MSK: gait normal, muscle tone and strength WNL, no joint swelling, effusions, discoloration, crepitus  SKIN: clear, good turgor, color WNL, no rashes, lesions, or ulcerations Neuro: normal mental status, normal strength, sensation, and motion Psych: alert; oriented to person, place and time, normally interactive and not anxious or depressed in appearance.   All labs reviewed with patient. Results for orders placed or performed in visit on 01/23/20  Hemoglobin A1c  Result Value Ref Range   Hgb A1c MFr Bld 6.9 (H) 4.6 - 6.5 %  Microalbumin / creatinine urine ratio  Result Value Ref Range   Microalb, Ur <0.7 0.0 - 1.9 mg/dL   Creatinine,U 91.5 mg/dL   Microalb Creat Ratio 0.8 0.0 - 30.0 mg/g  Basic metabolic panel  Result Value Ref Range   Sodium 137 135 - 145 mEq/L   Potassium 4.2 3.5 - 5.1 mEq/L   Chloride 102 96 - 112 mEq/L   CO2 30 19 - 32 mEq/L   Glucose, Bld 112 (H) 70 - 99 mg/dL   BUN 17 6 - 23 mg/dL    Creatinine, Ser 0.74 0.40 - 1.20 mg/dL   GFR 79.10 >60.00 mL/min   Calcium 9.8 8.4 - 10.5 mg/dL  CBC with Differential/Platelet  Result Value Ref Range   WBC 7.2 4.0 - 10.5 K/uL   RBC 4.75 3.87 - 5.11 Mil/uL   Hemoglobin 12.8 12.0 - 15.0 g/dL   HCT 39.3 36 - 46 %   MCV 82.7 78.0 - 100.0 fl   MCHC 32.6 30.0 - 36.0 g/dL   RDW 13.5 11.5 - 15.5 %   Platelets 273.0 150 - 400 K/uL   Neutrophils Relative % 52.7 43 - 77 %   Lymphocytes Relative 37.5 12 - 46 %   Monocytes Relative 7.6 3 - 12 %   Eosinophils Relative 1.9 0 - 5 %   Basophils Relative 0.3 0 - 3 %   Neutro Abs 3.8 1.4 - 7.7 K/uL   Lymphs Abs 2.7 0.7 - 4.0 K/uL   Monocytes Absolute 0.5 0 - 1 K/uL   Eosinophils Absolute 0.1 0 - 0 K/uL   Basophils Absolute 0.0 0 - 0 K/uL  Hepatic function panel  Result Value Ref Range   Total Bilirubin 0.6 0.2 - 1.2 mg/dL   Bilirubin, Direct 0.1 0.0 - 0.3 mg/dL   Alkaline Phosphatase 59 39 - 117 U/L   AST 29 0 - 37 U/L   ALT 31 0 - 35 U/L   Total Protein 6.9 6.0 - 8.3 g/dL   Albumin 4.6 3.5 - 5.2 g/dL  Lipid panel  Result Value Ref Range   Cholesterol 131 0 - 200 mg/dL   Triglycerides 225.0 (H) 0 - 149 mg/dL   HDL 40.50 >39.00 mg/dL   VLDL 45.0 (H) 0.0 - 40.0 mg/dL   Total CHOL/HDL Ratio 3    NonHDL 90.94   VITAMIN D 25 Hydroxy (Vit-D Deficiency, Fractures)  Result Value Ref Range   VITD 31.92 30.00 - 100.00 ng/mL  TSH  Result Value Ref Range   TSH 6.55 (H) 0.35 - 4.50 uIU/mL  T4, free  Result Value Ref Range   Free T4 0.99 0.60 - 1.60 ng/dL  T3, free  Result Value Ref Range   T3, Free 3.4 2.3 - 4.2 pg/mL  LDL cholesterol, direct  Result Value Ref Range   Direct LDL 68.0 mg/dL   No results found.  Assessment and Plan:     ICD-10-CM   1. Healthcare maintenance  Z00.00    , She is doing well otherwise.  Free hormone thyroid levels are normal, she does have a mildly elevated TSH.  Her blood sugars are doing great.  Health Maintenance Exam: The patient's preventative  maintenance and recommended screening tests for an annual wellness exam were reviewed in full today. Brought up to date unless services declined.  Counselled on the importance of diet, exercise, and its role in overall health and mortality. The patient's FH and SH was reviewed, including their home life, tobacco status, and drug and alcohol status.  Follow-up in 1 year for physical exam or additional follow-up below.  Follow-up: No follow-ups on file. Or follow-up in 1 year if not noted.  Future Appointments  Date Time Provider [place]  02/19/2020 11:00 AM Noralyn Pick, NP [facility]    No orders of the defined types were placed in this encounter.  There are no discontinued medications. No orders of the defined types were placed in this encounter.   Signed,  Maud Deed. Zahari Xiang, MD   Allergies as of 01/28/2020      Reactions   Pseudoeph-doxylamine-dm-apap Anaphylaxis   Allergy to nyquil   Gluten Other (See Comments)   Gi upset   Penicillins Itching, Rash      Medication List       Accurate as of January 28, 2020 11:59 PM. If you have any questions, ask your nurse or doctor.        AMBULATORY NON FORMULARY MEDICATION Diltiazem2% lidocaine 2% fissure cream. Apply a small amount inside the anal opening and to the external anal area three times a day for 6 weeks.   aspirin EC 81 MG tablet Take 81 mg by mouth daily.   clobetasol cream 0.05 % Commonly known as: TEMOVATE APPLY TWO TIMES A DAY FOR 2 WEEKS THEN USE AS NEEDED FOR LICHEN SCLEROSIS   cloNIDine 0.1 MG tablet Commonly known as: CATAPRES Take 1 tablet (0.1 mg total) by mouth daily.   cyclobenzaprine 10 MG tablet Commonly known as: FLEXERIL TAKE 1 TABLET (10 MG TOTAL) BY MOUTH 2 (TWO) TIMES DAILY AS NEEDED (PAIN).   EMGALITY [place] Inject into the skin every 30 (thirty) days.   fenofibrate 160 MG tablet TAKE ONE TABLET BY MOUTH DAILY   gabapentin 300 MG capsule Commonly known  as: NEURONTIN Take 1 capsule (300 mg total) by mouth at bedtime.   glucose blood test strip Commonly known as: Accu-Chek [place] Use to check blood sugar once daily   levothyroxine 75 MCG tablet Commonly known as: SYNTHROID TAKE ONE TABLET BY MOUTH EVERY MORNING WITH BREAKFAST   magnesium gluconate 500 MG tablet Commonly known as: MAGONATE Take 500 mg by mouth daily.   metoprolol succinate 50 MG 24 hr tablet Commonly known as: TOPROL-XL TAKE ONE TABLET BY MOUTH DAILY WITH OR IMMEDIATELY FOLLOWING A MEAL   nitroGLYCERIN 0.4 MG SL tablet Commonly known as: NITROSTAT 1 tablet under tongue at onset of chest pain; you may repeat every 5 minutes for up to 3 doses.   ONE-A-DAY WOMENS PETITES PO Take 2 tablets by mouth daily.   pantoprazole 40 MG tablet Commonly known as: PROTONIX TAKE ONE TABLET BY MOUTH DAILY  pravastatin 40 MG tablet Commonly known as: PRAVACHOL TAKE ONE TABLET BY MOUTH AT BEDTIME   rizatriptan 10 MG tablet Commonly known as: MAXALT   sertraline 50 MG tablet Commonly known as: ZOLOFT TAKE ONE TABLET BY MOUTH DAILY

## 2020-02-05 ENCOUNTER — Other Ambulatory Visit: Payer: Self-pay | Admitting: Nurse Practitioner

## 2020-02-08 ENCOUNTER — Other Ambulatory Visit: Payer: Self-pay | Admitting: Family Medicine

## 2020-02-13 ENCOUNTER — Other Ambulatory Visit: Payer: Self-pay | Admitting: Family Medicine

## 2020-02-19 ENCOUNTER — Ambulatory Visit: Payer: 59 | Admitting: Nurse Practitioner

## 2020-02-19 NOTE — Progress Notes (Deleted)
     02/19/2020 [place] [id number] 1955/07/03   Chief Complaint:  History of Present Illness: Brandi Mahoney is a 64 year old female with a past medical history of depression, CAD, remote asthma, hypothyroidism, migraine headaches and colon polyps.  I saw her in the office on 07/04/2019 due to having chronic anal pain.  She was assessed once again to have a chronic anal fissure which was treated with diltiazem/lidocaine ointment.  At the time, she did not wish to pursue evaluation by colorectal surgeon for possible future anal sphincterotomy.   Colonoscopy 01/02/2018 by Dr. Fuller Plan: - Anal fissure found on perianal exam. - One 9 mm polyp in the ascending colon, removed with a hot snare. Resected and retrieved. - Two 6 to 7 mm polyps in the transverse colon, removed with a cold snare. Resected and retrieved. - Internal hemorrhoids. - The examination was otherwise normal on direct and retroflexion views. Surgical [P], transverse and ascending, polyp (3) - TUBULAR ADENOMA WITHOUT HIGH GRADE DYSPLASIA OR MALIGNANCY. - SESSILE SERRATED POLYP(S) WITHOUT CYTOLOGIC DYSPLASIA   Current Medications, Allergies, Past Medical History, Past Surgical History, Family History and Social History were reviewed in [employer] record.   Physical Exam: There were no vitals taken for this visit. General: Well developed, w   ***female in no acute distress. Head: Normocephalic and atraumatic. Eyes: No scleral icterus. Conjunctiva pink . Ears: Normal auditory acuity. Mouth: Dentition intact. No ulcers or lesions.  Lungs: Clear throughout to auscultation. Heart: Regular rate and rhythm, no murmur. Abdomen: Soft, nontender and nondistended. No masses or hepatomegaly. Normal bowel sounds x 4 quadrants.  Rectal: *** Musculoskeletal: Symmetrical with no gross deformities. Extremities: No edema. Neurological: Alert oriented x 4. No focal deficits.  Psychological: Alert and  cooperative. Normal mood and affect  Assessment and Recommendations: ***

## 2020-03-01 ENCOUNTER — Other Ambulatory Visit: Payer: Self-pay | Admitting: Family Medicine

## 2020-03-03 ENCOUNTER — Other Ambulatory Visit: Payer: Self-pay | Admitting: Obstetrics & Gynecology

## 2020-03-03 DIAGNOSIS — Z1211 Encounter for screening for malignant neoplasm of colon: Secondary | ICD-10-CM

## 2020-03-03 LAB — FECAL OCCULT BLOOD, IMMUNOCHEMICAL: Fecal Occult Bld: POSITIVE — AB

## 2020-03-03 NOTE — Progress Notes (Signed)
Heme pos stool test Rec f/u w GI Sees Dr Fuller Plan regularly Will set up referral D/W pt Also encouraged her to schedule [facility]  Brandi Applebaum, MD, [facility] 03/03/2020  10:20 AM

## 2020-03-05 ENCOUNTER — Other Ambulatory Visit: Payer: Self-pay | Admitting: Family Medicine

## 2020-03-09 ENCOUNTER — Telehealth: Payer: Self-pay

## 2020-03-09 ENCOUNTER — Telehealth: Payer: Self-pay | Admitting: Gastroenterology

## 2020-03-09 NOTE — Telephone Encounter (Signed)
Patient has heme positive stools.  She will need an office visit for MD to determine next steps

## 2020-03-09 NOTE — Telephone Encounter (Signed)
Pt calling to see if the referral was put in for her blood in stool. Pt aware [facility] put in a referral on 03/03/20 to Dr Fuller Plan office.

## 2020-03-09 NOTE — Telephone Encounter (Signed)
Pt is requesting a call back from a nurse, pt had a colonoscopy done in 2019, not due until 2024 for the next one but the referring provider is requesting for another colonoscopy to be done sooner.

## 2020-03-23 ENCOUNTER — Other Ambulatory Visit: Payer: Self-pay

## 2020-03-23 ENCOUNTER — Ambulatory Visit
Admission: RE | Admit: 2020-03-23 | Discharge: 2020-03-23 | Disposition: A | Discharge status: Home / Self Care | Payer: 59 | Source: Ambulatory Visit | Attending: Obstetrics & Gynecology | Admitting: Obstetrics & Gynecology

## 2020-03-23 DIAGNOSIS — Z1231 Encounter for screening mammogram for malignant neoplasm of breast: Secondary | ICD-10-CM | POA: Insufficient documentation

## 2020-03-23 IMAGING — MG DIGITAL SCREENING BILAT W/ TOMO W/ CAD
6 of 10 series · 6 of 30 positions shown · non-contrast
Comparison: Previous exam(s).

CLINICAL DATA: Screening.

EXAM:
DIGITAL SCREENING BILATERAL MAMMOGRAM WITH TOMO AND CAD

[L CC synth-2D]
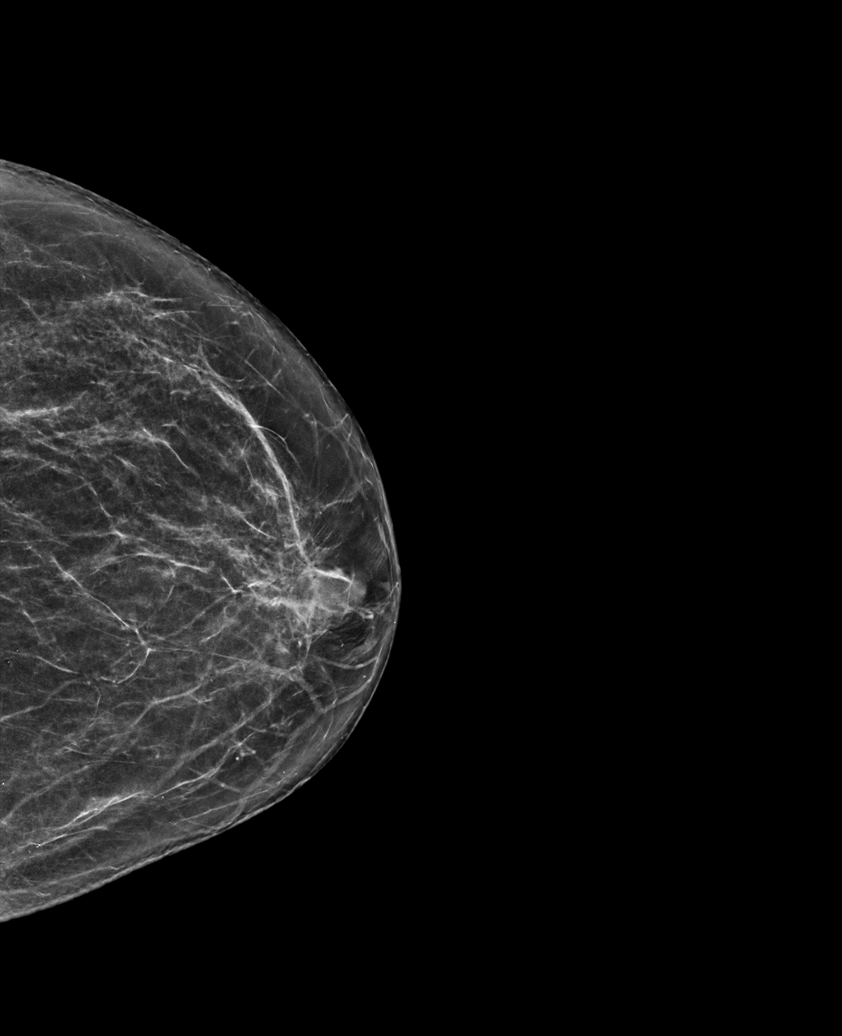

[R MLO synth-2D]
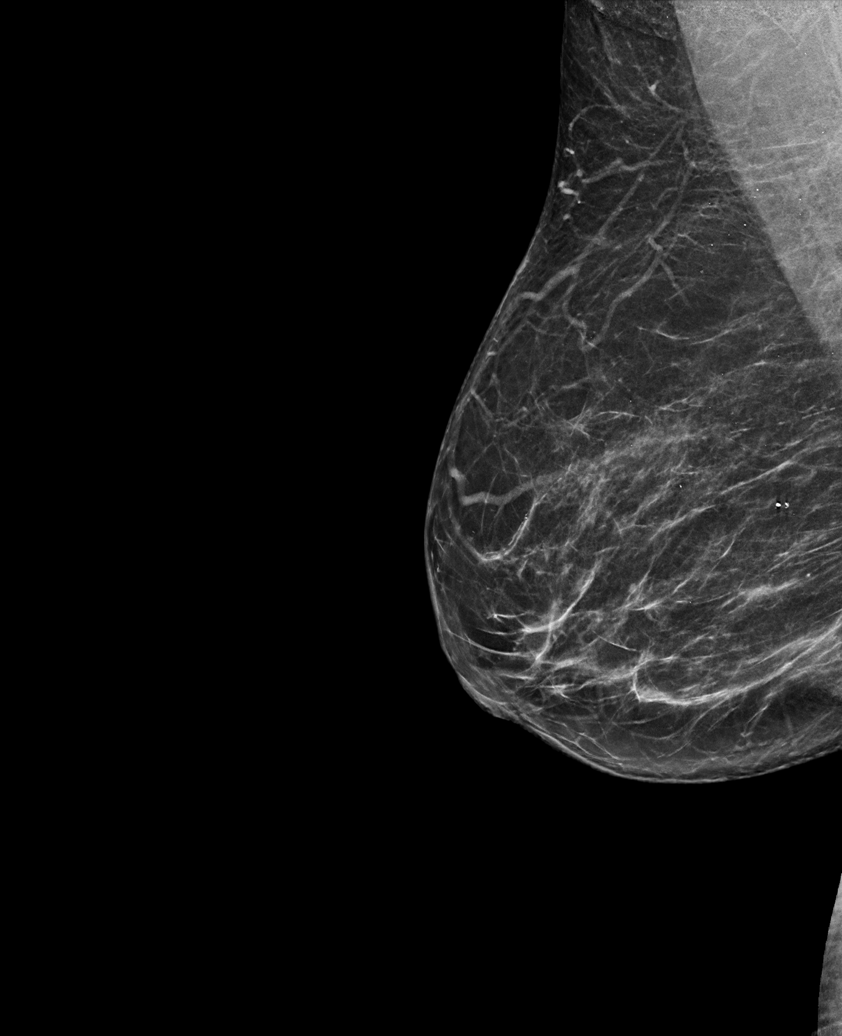

[R CC synth-2D]
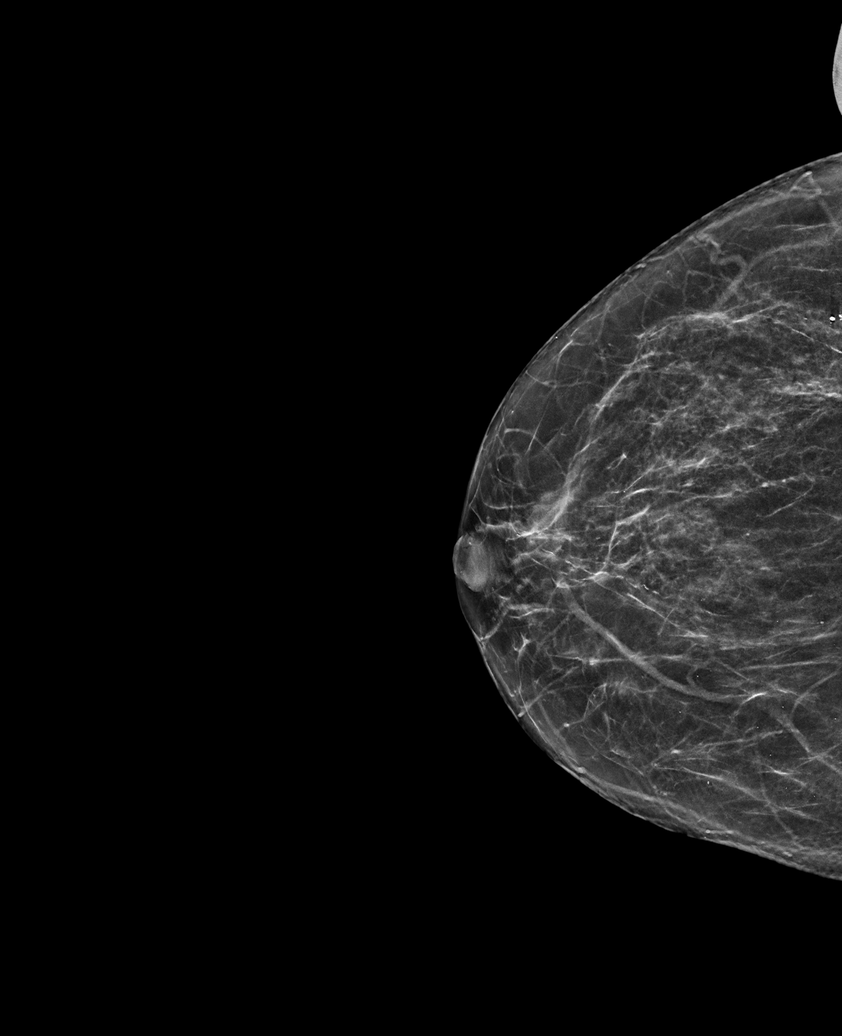

[R AT synth-2D]
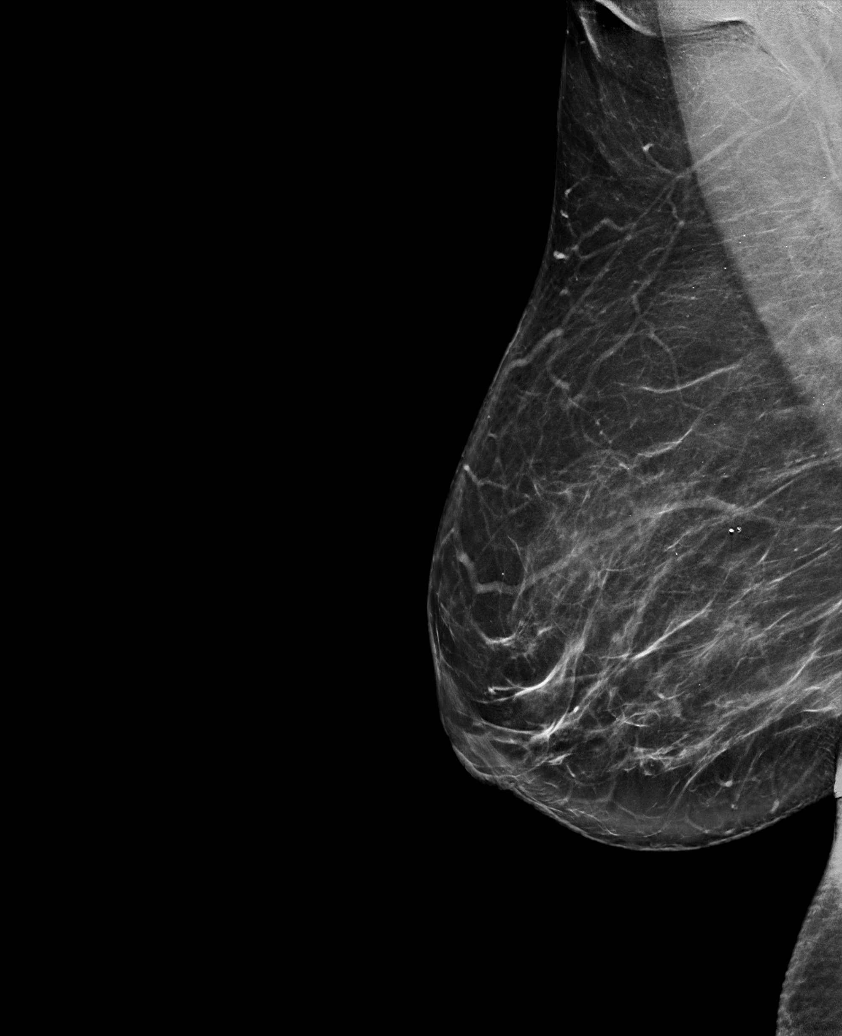

[L MLO synth-2D]
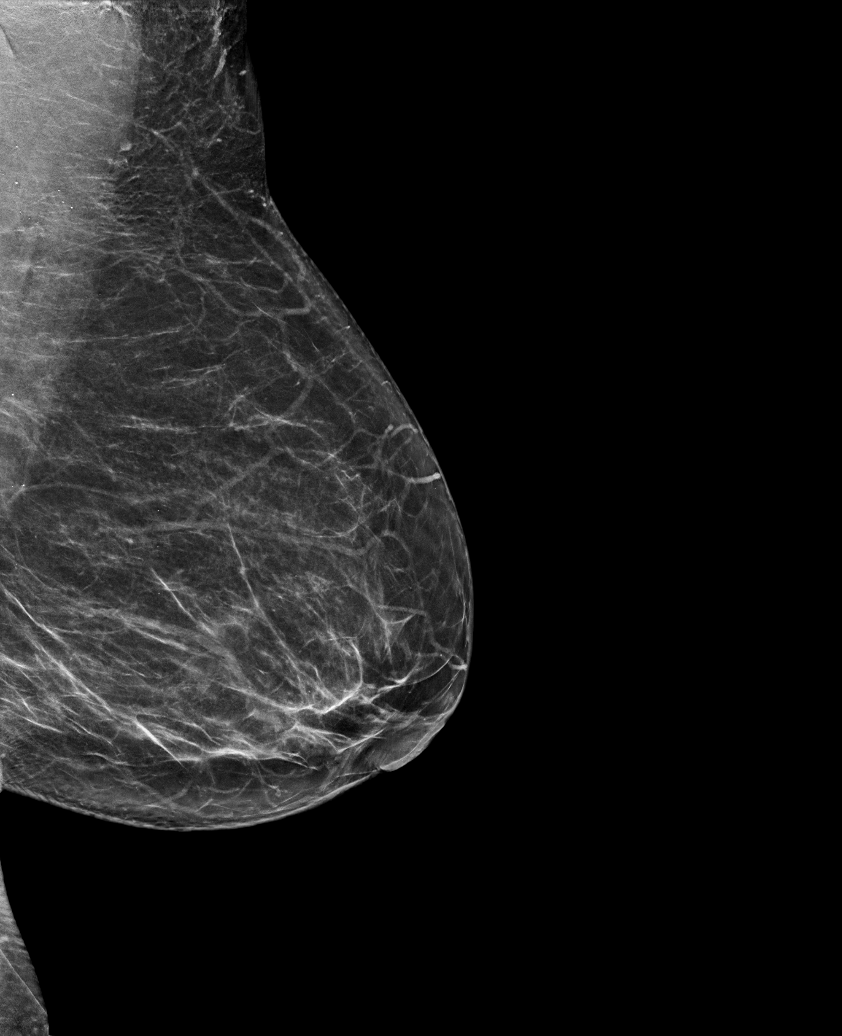

[R CC tomo · tomo slice 31/60.0]
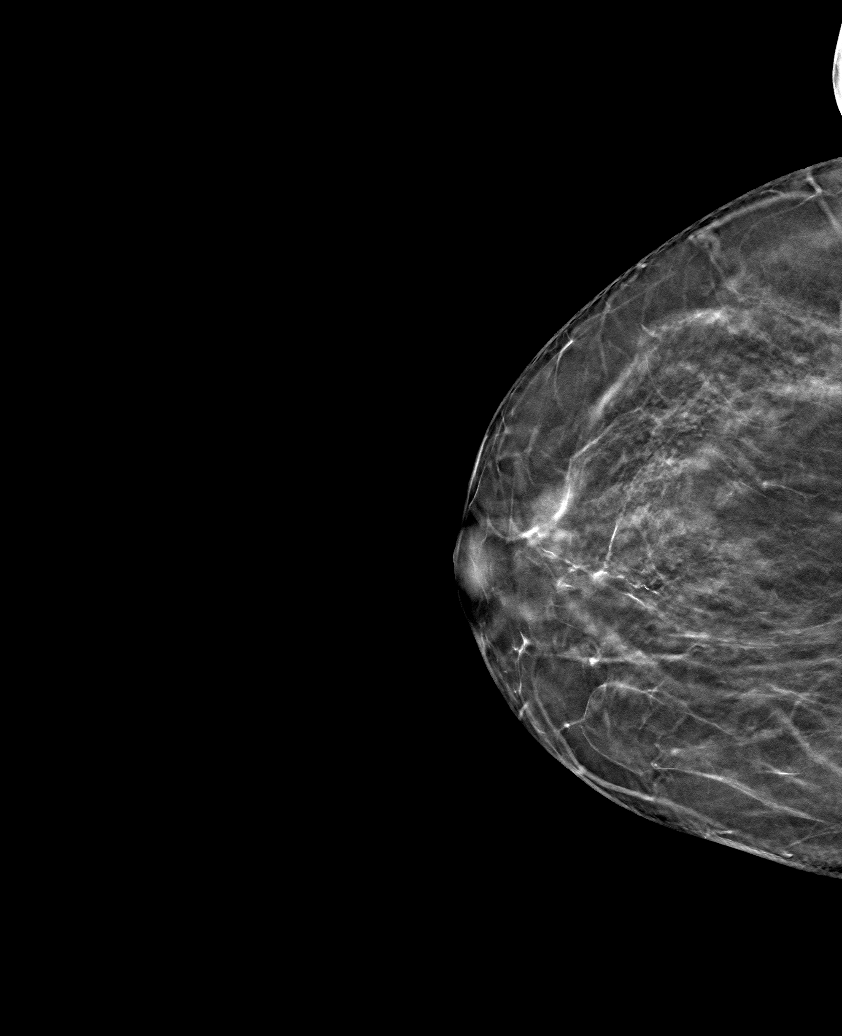

[6 of 30 positions shown; findings below may reference images not displayed]

ACR Breast Density Category b: There are scattered areas of
fibroglandular density.
FINDINGS: There are no findings suspicious for malignancy. Images were
processed with CAD.
IMPRESSION: No mammographic evidence of malignancy. A result letter of this
screening mammogram will be mailed directly to the patient.

RECOMMENDATION:
Screening mammogram in one year. (Code:[TQ])

BI-RADS CATEGORY  1: Negative.

## 2020-03-25 ENCOUNTER — Encounter: Payer: Self-pay | Admitting: Family Medicine

## 2020-03-25 DIAGNOSIS — G8929 Other chronic pain: Secondary | ICD-10-CM

## 2020-03-26 ENCOUNTER — Encounter: Payer: Self-pay | Admitting: Physical Medicine & Rehabilitation

## 2020-04-06 ENCOUNTER — Encounter: Payer: 59 | Admitting: Physical Medicine & Rehabilitation

## 2020-04-07 NOTE — Telephone Encounter (Signed)
Patient wanted to check on PT since the referral was a couple of weeks ago

## 2020-04-07 NOTE — Telephone Encounter (Signed)
Pt called in stated that she wanted to go to [facility] for physical therapy

## 2020-04-28 ENCOUNTER — Encounter: Payer: Self-pay | Admitting: Gastroenterology

## 2020-04-28 ENCOUNTER — Ambulatory Visit (INDEPENDENT_AMBULATORY_CARE_PROVIDER_SITE_OTHER): Payer: 59 | Admitting: Gastroenterology

## 2020-04-28 VITALS — BP 118/76 | HR 74 | Ht 64.0 in | Wt 171.2 lb

## 2020-04-28 DIAGNOSIS — K601 Chronic anal fissure: Secondary | ICD-10-CM | POA: Diagnosis not present

## 2020-04-28 DIAGNOSIS — K6289 Other specified diseases of anus and rectum: Secondary | ICD-10-CM

## 2020-04-28 MED ORDER — GLYCOPYRROLATE 1 MG PO TABS: 1.0000 mg | ORAL_TABLET | Freq: Two times a day (BID) | ORAL | 11 refills | Status: DC

## 2020-04-28 MED ORDER — AMBULATORY NON FORMULARY MEDICATION: 1 refills | Status: DC

## 2020-04-28 NOTE — Progress Notes (Signed)
    History of Present Illness: This is a 64 year old female complaining of persistent anal/rectal pain, constipation, rectal bleeding, Hemoccult positive stool, intermittent generalized abdominal pain. Anal fissure diagnosed in June 2019 and treated with 2 courses of diltiazem. Symptoms have improve but pain and bleeding with bowel movements persist. 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 She has been treated with diltiazem and lidocaine topically for the past few months. Benefiber and MiraLAX recommended for constipation.   Colonoscopy July 2019 - Anal fissure found on perianal exam. - One 9 mm polyp in the ascending colon, removed with a hot snare. Resected and retrieved. - Two 6 to 7 mm polyps in the transverse colon, removed with a cold snare. Resected and retrieved. - Internal hemorrhoids. - The examination was otherwise normal on direct and retroflexion views.   Current Medications, Allergies, Past Medical History, Past Surgical History, Family History and Social History were reviewed in [employer] record.   Physical Exam: General: Well developed, well nourished, no acute distress Head: Normocephalic and atraumatic Eyes:  sclerae anicteric, EOMI Ears: Normal auditory acuity Mouth: Not examined, mask on during Covid-19 pandemic Lungs: Clear throughout to auscultation Heart: Regular rate and rhythm; no murmurs, rubs or bruits Abdomen: Soft, non tender and non distended. No masses, hepatosplenomegaly or hernias noted. Normal Bowel sounds Rectal: Posterior anal fissure, small skin tags, mild  Musculoskeletal: Symmetrical with no gross deformities  Pulses:  Normal pulses noted Extremities: No clubbing, cyanosis, edema or deformities noted Neurological: Alert oriented x 4, grossly nonfocal Psychological:  Alert and cooperative. Normal mood and affect   Assessment  and Recommendations:  1. 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  NTG 0.125% gel topically tid. Continue lidocaine topically. Medical management has not been effective. Colorectal surgery referral for further mgmt.  2. Constipation, abdominal pain. Suspected IBS. Trial of glycopyrrolate 1 mg po bid. Benefiber qd, Prunes daily. Adequate water intake.   3. Personal history of tubular adenomas and sessile serrated polyps. A 5-year interval surveillance colonoscopy is recommended in July 2024.

## 2020-04-28 NOTE — Patient Instructions (Addendum)
We are referring you to [facility]. They will contact you with an appointment date and time.  Make certain to bring a list of current medications, including any over the counter medications or vitamins. Also bring your co-pay if you have one as well as your insurance cards. [facility] is located at [address]. Should you need to reschedule your appointment, please contact them at [phone].  We have sent the following medications to your pharmacy for you to pick up at your convenience: robinul.  We have sent a prescription for nitroglycerin 0.125% gel to [facility]. You should apply a pea size amount to your rectum three times daily x 6-8 weeks.  [facility]'s information is below: Address: [address], [place], [place] [address]  [phone]  *Please DO NOT go directly from our office to pick up this medication! Give the pharmacy 1 day to process the prescription as this is compounded and takes time to make.  Thank you for choosing me and [facility].  Pricilla Riffle. Dagoberto Ligas., MD., [place] Regal

## 2020-04-30 ENCOUNTER — Other Ambulatory Visit: Payer: Self-pay | Admitting: Family Medicine

## 2020-04-30 NOTE — Telephone Encounter (Signed)
Pharmacy requests refill on: Metoprolol SUCC ER 50 mg  LAST REFILL: 12/29/2019 LAST OV: 01/28/2020 NEXT OV: Not Scheduled  PHARMACY: [place]

## 2020-05-05 NOTE — Telephone Encounter (Signed)
Pt was scheduled 04/06/20 and cancelled  Appt Date Appt Time Status Department Visit Type Provider      04/06/2020 9:40 AM Canceled [facility] NEW PATIENT PATEL, ANKIT ANIL

## 2020-05-26 ENCOUNTER — Telehealth: Payer: Self-pay | Admitting: *Deleted

## 2020-05-26 MED ORDER — TRAZODONE HCL 100 MG PO TABS
100.0000 mg | ORAL_TABLET | Freq: Every evening | ORAL | 1 refills | Status: DC | PRN
Start: 2020-05-26 — End: 2020-10-04

## 2020-05-26 NOTE — Telephone Encounter (Signed)
Patient called stating that she was given something several months back that was suppose to help her insomnia. Patient stated that she does not remember the name of it but it did not work.  Patient stated that she is not able to fall asleep and wants to know if he will prescribe something different for sleep.Patient stated that she can not function without sleep. [facility]

## 2020-05-26 NOTE — Telephone Encounter (Signed)
Please call  I gave her a very low dose of Trazadone (25 - 50 mg)  We should increase the dose.  I sent her in 100 mg tablets and she can take up to 200 mg if needed.   If at all possible staying away from sleeping pills that are habit-forming is ideal.

## 2020-05-26 NOTE — Telephone Encounter (Signed)
Brandi Mahoney notified as instructed by telephone.  Patient states understanding.

## 2020-06-14 ENCOUNTER — Other Ambulatory Visit: Payer: Self-pay | Admitting: Family Medicine

## 2020-06-27 NOTE — Progress Notes (Signed)
Date:  06/28/2020   ID:  Brandi Mahoney, DOB Dec 06, 1955, MRN [id number]  Patient Location:  [place] [address]   Provider location:   [facility], [place] office  PCP:  Owens Loffler, MD  Cardiologist:  Patsy Baltimore   Chief Complaint  Patient presents with   Other    Patient c.o that's last week - BP was around 170s/80-90s - it was hard to breath when lying down. Meds reviewed verbally with patient.      History of Present Illness:    Brandi Mahoney is a 65 y.o. female  past medical history of hx of troponin elevation to 17,  cardiac catheterization August 2011 showing nonobstructive left circumflex disease, presentation felt to be secondary to a viral myocarditis with low normal systolic function by cath,  repeat admission to [facility] for chest discomfort October 2011, felt to be noncardiac with echocardiogram normal systolic function  Admitted to hospital 06/23/2016 chest pain Who presents for shortness of breath, tachycardia  In follow-up today reports having chronic shortness of breath Insomnia, on trazodone, Sleeping better with trazodone, 7-8 hrs, Very sedentary during the day Fatigues quickly  Stationary bike this Am 9 miles, 1 hrs, in front of TV  Stress at [facility] care of son who has Down's syndrome Previous hospitalizations for chest pain symptoms Work-up negative  Last week, spike in blood pressure 170s, was short of breath laying flat in the bed Eventually symptoms resolved without intervention Prior prescription of nitro 2018, expired  CT scan in 2018,  No coronary calcification  EKG personally reviewed by myself on todays visit Shows normal sinus rhythm rate 63 bpm no significant ST-T wave changes  Other past medical history reviewed Previous CT scan chest in the emergency room showing no PE No significant coronary calcification  Stress test as an inpatient showing  no ischemia 2018 Results reviewed with her including the CT scan, images were pulled up in the office and discussed  On a prior office visit in 2015, she reported having chest pain in the center of her chest radiating to her left arm starting 2 days ago. General malaise felt queasy, lightheaded, had chest pain and left arm pain.   shortness of breath with exertion. She is worried about progression of her CAD. She went to the emergency room on the way home from work. Cardiac enzymes were negative x2 EKG essentially normal.   Given her chest pain a stress test was performed February 2015.  showed no ischemia. She had good exercise tolerance though on a treadmill did develop left chest and arm pain. She achieved target heart rate. EKG was essentially normal with no significant changes concerning for ischemia. No perfusion abnormality.   Cardiac catheterization January 20 2010: mild atherosclerosis in the mid left circumflex after the takeoff of the OM1, mild diffuse LAD disease repeat echocardiogram on March 19, 2010 showing ejection fraction 56-43%, diastolic dysfunction, mild LVH   Prior CV studies:   The following studies were reviewed today:    Past Medical History:  Diagnosis Date   Asthma    does not use an inhaler.  Dx over 20 yrsago.   Blood transfusion without reported diagnosis    2016 Kidney stone removal   CAD (coronary artery disease)    a. 01/2010 Cath: mild atherosclerosis in the mid LCX after takeoff of OM1, mild diff LAD dzs;  b. 07/2013 MV: EF 67%, no ischemia.  Depression    Heart murmur    Hemorrhoids    History of pneumonia 1969   HLD (hyperlipidemia)    Hypothyroidism    IBS (irritable bowel syndrome)    Migraine headache    Myocarditis ([place])    a. 01/2010 presented w/ c/p and elev trop-->Cath: mild atherosclerosis in the mid LCX after takeoff of OM1, mild diff LAD dzs;  b. 03/2010 Echo: EF 60-65%, DD, mild LVH;  c. 07/2011 Echo: EF 55-60%, mild LVH,  no rwma.   Prediabetes    Sleep apnea    has c-pap.  wears off and on - not lately 01-01-18 per reported   Past Surgical History:  Procedure Laterality Date   APPENDECTOMY  1974   BREAST EXCISIONAL BIOPSY Right 1988   neg   CARDIAC CATHETERIZATION  8/11   no significant -[facility]- Dr Rockey Situ,   CHOLECYSTECTOMY  2002   COLONOSCOPY  2013   CYST EXCISION     NEPHROLITHOTOMY Right 08/20/2014   Procedure: RIGHT PERCUTANEOUS NEPHROLITHOTOMY ;  Surgeon: Malka So, MD;  Location: [facility];  Service: Urology;  Laterality: Right;   POLYPECTOMY     TOTAL ABDOMINAL HYSTERECTOMY W/ BILATERAL SALPINGOOPHORECTOMY  2000   no ovaries, uterus, or cervix.     Current Meds  Medication Sig   AMBULATORY NON FORMULARY MEDICATION Medication Name: Nitroglycerin gel 0.125 % please apply pea sized amount to rectum three times a day x 6-8 weeks   aspirin EC 81 MG tablet Take 81 mg by mouth daily.   clobetasol cream (TEMOVATE) 0.05 % APPLY TWO TIMES A DAY FOR 2 WEEKS THEN USE AS NEEDED FOR LICHEN SCLEROSIS   cyclobenzaprine (FLEXERIL) 10 MG tablet TAKE 1 TABLET (10 MG TOTAL) BY MOUTH 2 (TWO) TIMES DAILY AS NEEDED (PAIN).   fenofibrate 160 MG tablet TAKE ONE TABLET BY MOUTH DAILY   gabapentin (NEURONTIN) 300 MG capsule Take 1 capsule (300 mg total) by mouth at bedtime.   Galcanezumab-gnlm (EMGALITY [place]) Inject into the skin every 30 (thirty) days.   glucose blood (ACCU-CHEK AVIVA) test strip Use to check blood sugar once daily   levothyroxine (SYNTHROID) 75 MCG tablet TAKE ONE TABLET BY MOUTH EVERY MORNING WITH BREAKFAST   magnesium gluconate (MAGONATE) 500 MG tablet Take 500 mg by mouth daily.    Multiple Vitamins-Minerals (ONE-A-DAY WOMENS PETITES PO) Take 2 tablets by mouth daily.   pantoprazole (PROTONIX) 40 MG tablet TAKE ONE TABLET BY MOUTH DAILY   pravastatin (PRAVACHOL) 40 MG tablet TAKE ONE TABLET BY MOUTH AT BEDTIME   sertraline (ZOLOFT) 50 MG tablet TAKE ONE TABLET BY MOUTH  DAILY   traZODone (DESYREL) 100 MG tablet Take 1-2 tablets (100-200 mg total) by mouth at bedtime as needed for sleep.   [DISCONTINUED] metoprolol succinate (TOPROL-XL) 50 MG 24 hr tablet TAKE ONE TABLET BY MOUTH DAILY WITH OR IMMEDIATELY FOLLOWING A MEAL   [DISCONTINUED] nitroGLYCERIN (NITROSTAT) 0.4 MG SL tablet 1 tablet under tongue at onset of chest pain; you may repeat every 5 minutes for up to 3 doses.     Allergies:   Pseudoeph-doxylamine-dm-apap, Gluten, and Penicillins   Social History   Tobacco Use   Smoking status: Former Smoker    Packs/day: 0.35    Years: 35.00    Pack years: 12.25    Types: Cigarettes    Quit date: 09/25/2008    Years since quitting: 11.7   Smokeless tobacco: Never Used   Tobacco comment: quit in 2007-2008, social smoker  Vaping Use  Vaping Use: Never used  Substance Use Topics   Alcohol use: Yes    Comment: rarely   Drug use: No     Family Hx: The patient's family history includes ALS in her mother; Breast cancer in her paternal grandmother; Breast cancer (age of onset: 73) in her sister; Colon cancer in her maternal grandfather; Coronary artery disease in her mother; Diabetes in her mother; Down syndrome in her son; Hyperlipidemia in her father; Lung cancer in her paternal grandfather; Other in her daughter. There is no history of Esophageal cancer, Liver cancer, Pancreatic cancer, Rectal cancer, or Stomach cancer.  ROS:   Please see the history of present illness.    Review of Systems  Constitutional: Negative.   HENT: Negative.   Respiratory: Positive for shortness of breath.   Cardiovascular: Negative.   Gastrointestinal: Negative.   Musculoskeletal: Negative.   Neurological: Negative.   Psychiatric/Behavioral: Negative.   All other systems reviewed and are negative.    Labs/Other Tests and Data Reviewed:    Recent Labs: 01/23/2020: ALT 31; BUN 17; Creatinine, Ser 0.74; Hemoglobin 12.8; Platelets 273.0; Potassium 4.2; Sodium  137; TSH 6.55   Recent Lipid Panel Lab Results  Component Value Date/Time   CHOL 131 01/23/2020 09:00 AM   CHOL 151 10/17/2011 08:26 AM   TRIG 225.0 (H) 01/23/2020 09:00 AM   TRIG 136 10/17/2011 08:26 AM   HDL 40.50 01/23/2020 09:00 AM   HDL 44 10/17/2011 08:26 AM   CHOLHDL 3 01/23/2020 09:00 AM   LDLCALC 62 09/23/2018 11:19 AM   LDLCALC 80 10/17/2011 08:26 AM   LDLDIRECT 68.0 01/23/2020 09:00 AM    Wt Readings from Last 3 Encounters:  06/28/20 173 lb (78.5 kg)  04/28/20 171 lb 3.2 oz (77.7 kg)  01/28/20 172 lb 12 oz (78.4 kg)     Exam:    Vital Signs: Vital signs may also be detailed in the HPI BP 126/68 (BP Location: Left Arm, Patient Position: Sitting, Cuff Size: Normal)    Pulse 63    Ht 5\' 4"  (1.626 m)    Wt 173 lb (78.5 kg)    SpO2 96%    BMI 29.70 kg/m    Constitutional:  oriented to person, place, and time. No distress.  HENT:  Head: Grossly normal Eyes:  no discharge. No scleral icterus.  Neck: No JVD, no carotid bruits  Cardiovascular: Regular rate and rhythm, no murmurs appreciated Pulmonary/Chest: Clear to auscultation bilaterally, no wheezes or rails Abdominal: Soft.  no distension.  no tenderness.  Musculoskeletal: Normal range of motion Neurological:  normal muscle tone. Coordination normal. No atrophy Skin: Skin warm and dry Psychiatric: normal affect, pleasant  ASSESSMENT & PLAN:    Shortness of breath Does not want Lasix given wish to avoid constipation in the setting of anal fissure Recommended regular walking program for conditioning  Mixed hyperlipidemia Cholesterol is at goal on the current lipid regimen. No changes to the medications were made.  Chronic pain syndrome Followed by primary care  Major depressive disorder, recurrent episode, in full remission (HCC) Managed by primary care  Essential hypertension Rare spikes in blood pressure, etiology unclear Best option if blood pressure stays persistently elevated would be sublingual  nitro but recommended caution , and to be used if pressure is persistently elevated, in effort to avoid trip to the emergency room   Total encounter time more than 25 minutes  Greater than 50% was spent in counseling and coordination of care with the patient   Signed,  Ida [name], MD  06/28/2020 5:21 PM    [place] Office [address], [place], [place] [address]

## 2020-06-28 ENCOUNTER — Ambulatory Visit (INDEPENDENT_AMBULATORY_CARE_PROVIDER_SITE_OTHER): Payer: 59 | Admitting: Cardiovascular Disease

## 2020-06-28 ENCOUNTER — Other Ambulatory Visit: Payer: Self-pay

## 2020-06-28 ENCOUNTER — Encounter: Payer: Self-pay | Admitting: Cardiovascular Disease

## 2020-06-28 VITALS — BP 126/68 | HR 63 | Ht 64.0 in | Wt 173.0 lb

## 2020-06-28 DIAGNOSIS — G894 Chronic pain syndrome: Secondary | ICD-10-CM | POA: Diagnosis not present

## 2020-06-28 DIAGNOSIS — R079 Chest pain, unspecified: Secondary | ICD-10-CM

## 2020-06-28 DIAGNOSIS — E782 Mixed hyperlipidemia: Secondary | ICD-10-CM

## 2020-06-28 DIAGNOSIS — R0602 Shortness of breath: Secondary | ICD-10-CM

## 2020-06-28 MED ORDER — NITROGLYCERIN 0.4 MG SL SUBL: SUBLINGUAL_TABLET | SUBLINGUAL | 3 refills | Status: DC

## 2020-06-28 MED ORDER — METOPROLOL SUCCINATE ER 50 MG PO TB24
50.0000 mg | ORAL_TABLET | Freq: Every day | ORAL | 3 refills | Status: DC
Start: 2020-06-28 — End: 2021-09-29

## 2020-06-28 NOTE — Patient Instructions (Addendum)
Medication Instructions:   1. Take Nirto as needed for High Blood Pressure.   If you need a refill on your cardiac medications before your next appointment, please call your pharmacy.    Lab work: No new labs needed   If you have labs (blood work) drawn today and your tests are completely normal, you will receive your results only by: Marland Kitchen MyChart Message (if you have MyChart) OR . A paper copy in the mail If you have any lab test that is abnormal or we need to change your treatment, we will call you to review the results.   Testing/Procedures: No new testing needed   Follow-Up: At [facility], you and your health needs are our priority.  As part of our continuing mission to provide you with exceptional heart care, we have created designated Provider Care Teams.  These Care Teams include your primary Cardiologist (physician) and Advanced Practice Providers (APPs -  Physician Assistants and Nurse Practitioners) who all work together to provide you with the care you need, when you need it.  . You will need a follow up appointment in 12 months  . Providers on your designated Care Team:   . Murray Hodgkins, NP . Christell Faith, PA-C . Marrianne Mood, PA-C  Any Other Special Instructions Will Be Listed Below (If Applicable).  COVID-19 Vaccine Information can be found at: [email or web address] For questions related to vaccine distribution or appointments, please email [email or web address] or call [phone].

## 2020-07-20 ENCOUNTER — Other Ambulatory Visit (HOSPITAL_COMMUNITY): Payer: 59

## 2020-07-29 ENCOUNTER — Other Ambulatory Visit: Payer: Self-pay

## 2020-07-29 ENCOUNTER — Encounter (HOSPITAL_BASED_OUTPATIENT_CLINIC_OR_DEPARTMENT_OTHER): Payer: Self-pay | Admitting: Surgery

## 2020-07-29 ENCOUNTER — Ambulatory Visit: Payer: Self-pay | Admitting: Surgery

## 2020-07-29 NOTE — Progress Notes (Signed)
Spoke w/ via phone for pre-op interview---pt Lab needs dos---- I stat 8              COVID test ------07-30-2020 1045 Arrive at -------530 am 08-02-2020 NPO after MN NO Solid Food.  Clear liquids from MN until---430 am then npo Medications to take morning of surgery -----sertraline, fenofibrate, levothyroxine, pantaprazole Diabetic medication -----n/a Patient Special Instructions -----none Pre-Op special Istructions -----none Patient verbalized understanding of instructions that were given at this phone interview. Patient denies shortness of breath, chest pain, fever, cough at this phone interview.  Anesthesia: lov dr Rockey Situ 06-28-2020 for sob epic, pt has chronic sob, dr Rockey Situ note stated he felt sob was due to pt needs conditioning and he recommended regular walking for conditioning, pt stated has sob on exertion   On occasion at pre op call.  stress test 11-08-2018 epic Chest xray 11-02-2018 epic Echo 06-28-2020 epic ekg 06-28-2020 epic

## 2020-07-30 ENCOUNTER — Other Ambulatory Visit (HOSPITAL_COMMUNITY)
Admission: RE | Admit: 2020-07-30 | Discharge: 2020-07-30 | Disposition: A | Discharge status: Home / Self Care | Payer: 59 | Source: Ambulatory Visit | Attending: Surgery | Admitting: Surgery

## 2020-07-30 DIAGNOSIS — Z01812 Encounter for preprocedural laboratory examination: Secondary | ICD-10-CM | POA: Insufficient documentation

## 2020-07-30 DIAGNOSIS — Z20822 Contact with and (suspected) exposure to covid-19: Secondary | ICD-10-CM | POA: Diagnosis not present

## 2020-07-30 LAB — SARS CORONAVIRUS 2 (TAT 6-24 HRS): SARS Coronavirus 2: NEGATIVE

## 2020-08-01 NOTE — Anesthesia Preprocedure Evaluation (Addendum)
Anesthesia Evaluation  Patient identified by MRN, date of birth, ID band Patient awake    Reviewed: Allergy & Precautions, NPO status , Patient's Chart, lab work & pertinent test results  Airway Mallampati: III  TM Distance: >3 FB Neck ROM: Full    Dental no notable dental hx.    Pulmonary sleep apnea and Continuous Positive Airway Pressure Ventilation , former smoker,    Pulmonary exam normal breath sounds clear to auscultation       Cardiovascular + CAD  Normal cardiovascular exam Rhythm:Regular Rate:Normal  ECG: NSR, rate 63   Neuro/Psych  Headaches, PSYCHIATRIC DISORDERS Anxiety Depression    GI/Hepatic Neg liver ROS, GERD  Medicated and Controlled,  Endo/Other  diabetesHypothyroidism   Renal/GU negative Renal ROS     Musculoskeletal negative musculoskeletal ROS (+)   Abdominal   Peds  Hematology HLD   Anesthesia Other Findings CHRONIC ANAL FISSURE MEDICALLY REFRACTORY  Reproductive/Obstetrics                            Anesthesia Physical Anesthesia Plan  ASA: II  Anesthesia Plan: MAC   Post-op Pain Management:    Induction: Intravenous  PONV Risk Score and Plan: 2 and Ondansetron, Dexamethasone, Midazolam, Propofol infusion and Treatment may vary due to age or medical condition  Airway Management Planned: Simple Face Mask  Additional Equipment:   Intra-op Plan:   Post-operative Plan:   Informed Consent: I have reviewed the patients History and Physical, chart, labs and discussed the procedure including the risks, benefits and alternatives for the proposed anesthesia with the patient or authorized representative who has indicated his/her understanding and acceptance.     Dental advisory given  Plan Discussed with: CRNA  Anesthesia Plan Comments:         Anesthesia Quick Evaluation

## 2020-08-02 ENCOUNTER — Encounter (HOSPITAL_BASED_OUTPATIENT_CLINIC_OR_DEPARTMENT_OTHER): Payer: Self-pay | Admitting: Surgery

## 2020-08-02 ENCOUNTER — Ambulatory Visit (HOSPITAL_BASED_OUTPATIENT_CLINIC_OR_DEPARTMENT_OTHER): Payer: 59 | Admitting: Anesthesiology

## 2020-08-02 ENCOUNTER — Ambulatory Visit (HOSPITAL_BASED_OUTPATIENT_CLINIC_OR_DEPARTMENT_OTHER)
Admission: RE | Admit: 2020-08-02 | Discharge: 2020-08-02 | Disposition: A | Discharge status: Home / Self Care | Payer: 59 | Attending: Surgery | Admitting: Surgery

## 2020-08-02 ENCOUNTER — Encounter (HOSPITAL_BASED_OUTPATIENT_CLINIC_OR_DEPARTMENT_OTHER)
Admission: RE | Disposition: A | Discharge status: Home / Self Care | Payer: Self-pay | Source: Home / Self Care | Attending: Surgery

## 2020-08-02 DIAGNOSIS — Z880 Allergy status to penicillin: Secondary | ICD-10-CM | POA: Insufficient documentation

## 2020-08-02 DIAGNOSIS — Z888 Allergy status to other drugs, medicaments and biological substances status: Secondary | ICD-10-CM | POA: Diagnosis not present

## 2020-08-02 DIAGNOSIS — Z87891 Personal history of nicotine dependence: Secondary | ICD-10-CM | POA: Diagnosis not present

## 2020-08-02 DIAGNOSIS — Z87892 Personal history of anaphylaxis: Secondary | ICD-10-CM | POA: Insufficient documentation

## 2020-08-02 DIAGNOSIS — K602 Anal fissure, unspecified: Secondary | ICD-10-CM | POA: Diagnosis present

## 2020-08-02 DIAGNOSIS — K648 Other hemorrhoids: Secondary | ICD-10-CM | POA: Insufficient documentation

## 2020-08-02 DIAGNOSIS — K644 Residual hemorrhoidal skin tags: Secondary | ICD-10-CM | POA: Diagnosis not present

## 2020-08-02 HISTORY — DX: Personal history of urinary calculi: Z87.442

## 2020-08-02 HISTORY — DX: Presence of spectacles and contact lenses: Z97.3

## 2020-08-02 HISTORY — PX: RECTAL EXAM UNDER ANESTHESIA: SHX6399

## 2020-08-02 HISTORY — DX: Prediabetes: R73.03

## 2020-08-02 HISTORY — DX: Dyspnea, unspecified: R06.00

## 2020-08-02 LAB — POCT I-STAT, CHEM 8
BUN: 20 mg/dL (ref 8–23)
Calcium, Ion: 1.32 mmol/L (ref 1.15–1.40)
Chloride: 103 mmol/L (ref 98–111)
Creatinine, Ser: 0.6 mg/dL (ref 0.44–1.00)
Glucose, Bld: 140 mg/dL — ABNORMAL HIGH (ref 70–99)
HCT: 39 % (ref 36.0–46.0)
Hemoglobin: 13.3 g/dL (ref 12.0–15.0)
Potassium: 4 mmol/L (ref 3.5–5.1)
Sodium: 141 mmol/L (ref 135–145)
TCO2: 27 mmol/L (ref 22–32)

## 2020-08-02 SURGERY — EXAM UNDER ANESTHESIA, RECTUM
Anesthesia: Monitor Anesthesia Care | Site: Rectum

## 2020-08-02 MED ORDER — LACTATED RINGERS IV SOLN: INTRAVENOUS | Status: DC

## 2020-08-02 MED ORDER — BUPIVACAINE LIPOSOME 1.3 % IJ SUSP: 20.0000 mL | Freq: Once | INTRAMUSCULAR | Status: DC

## 2020-08-02 MED ORDER — FENTANYL CITRATE (PF) 100 MCG/2ML IJ SOLN: 25.0000 ug | INTRAMUSCULAR | Status: DC | PRN

## 2020-08-02 MED ORDER — MIDAZOLAM HCL 5 MG/5ML IJ SOLN
INTRAMUSCULAR | Status: DC | PRN
  Administered 2020-08-02: 2 mg via INTRAVENOUS

## 2020-08-02 MED ORDER — ONDANSETRON HCL 4 MG/2ML IJ SOLN
INTRAMUSCULAR | Status: DC | PRN
  Administered 2020-08-02: 4 mg via INTRAVENOUS

## 2020-08-02 MED ORDER — OXYCODONE HCL 5 MG/5ML PO SOLN: 5.0000 mg | Freq: Once | ORAL | Status: DC | PRN

## 2020-08-02 MED ORDER — PROPOFOL 10 MG/ML IV BOLUS
INTRAVENOUS | Status: DC | PRN
  Administered 2020-08-02: 30 mg via INTRAVENOUS

## 2020-08-02 MED ORDER — PROPOFOL 10 MG/ML IV BOLUS
INTRAVENOUS | Status: AC
  Filled 2020-08-02: qty 20

## 2020-08-02 MED ORDER — PROMETHAZINE HCL 25 MG/ML IJ SOLN: 6.2500 mg | INTRAMUSCULAR | Status: DC | PRN

## 2020-08-02 MED ORDER — ACETAMINOPHEN 10 MG/ML IV SOLN: 1000.0000 mg | Freq: Once | INTRAVENOUS | Status: DC | PRN

## 2020-08-02 MED ORDER — BUPIVACAINE LIPOSOME 1.3 % IJ SUSP
INTRAMUSCULAR | Status: DC | PRN
  Administered 2020-08-02: 20 mL

## 2020-08-02 MED ORDER — BUPIVACAINE-EPINEPHRINE 0.25% -1:200000 IJ SOLN
INTRAMUSCULAR | Status: DC | PRN
  Administered 2020-08-02: 30 mL

## 2020-08-02 MED ORDER — KETOROLAC TROMETHAMINE 30 MG/ML IJ SOLN
INTRAMUSCULAR | Status: AC
  Filled 2020-08-02: qty 1

## 2020-08-02 MED ORDER — KETOROLAC TROMETHAMINE 30 MG/ML IJ SOLN
30.0000 mg | Freq: Once | INTRAMUSCULAR | Status: AC
  Administered 2020-08-02: 30 mg via INTRAVENOUS

## 2020-08-02 MED ORDER — AMISULPRIDE (ANTIEMETIC) 5 MG/2ML IV SOLN: 10.0000 mg | Freq: Once | INTRAVENOUS | Status: DC | PRN

## 2020-08-02 MED ORDER — FENTANYL CITRATE (PF) 100 MCG/2ML IJ SOLN
INTRAMUSCULAR | Status: DC | PRN
  Administered 2020-08-02: 50 ug via INTRAVENOUS

## 2020-08-02 MED ORDER — OXYCODONE HCL 5 MG PO TABS: 5.0000 mg | ORAL_TABLET | Freq: Once | ORAL | Status: DC | PRN

## 2020-08-02 MED ORDER — FENTANYL CITRATE (PF) 100 MCG/2ML IJ SOLN
INTRAMUSCULAR | Status: AC
  Filled 2020-08-02: qty 2

## 2020-08-02 MED ORDER — SODIUM CHLORIDE (PF) 0.9 % IJ SOLN
INTRAMUSCULAR | Status: DC | PRN
  Administered 2020-08-02: 3 mL

## 2020-08-02 MED ORDER — PROPOFOL 500 MG/50ML IV EMUL
INTRAVENOUS | Status: DC | PRN
  Administered 2020-08-02: 200 ug/kg/min via INTRAVENOUS

## 2020-08-02 MED ORDER — ONABOTULINUMTOXINA 100 UNITS IJ SOLR
INTRAMUSCULAR | Status: DC | PRN
  Administered 2020-08-02: 100 [IU] via INTRAMUSCULAR

## 2020-08-02 MED ORDER — CHLORHEXIDINE GLUCONATE CLOTH 2 % EX PADS: 6.0000 | MEDICATED_PAD | Freq: Once | CUTANEOUS | Status: DC

## 2020-08-02 MED ORDER — MIDAZOLAM HCL 2 MG/2ML IJ SOLN
INTRAMUSCULAR | Status: AC
  Filled 2020-08-02: qty 2

## 2020-08-02 MED ORDER — PROPOFOL 500 MG/50ML IV EMUL
INTRAVENOUS | Status: AC
  Filled 2020-08-02: qty 50

## 2020-08-02 MED ORDER — ONDANSETRON HCL 4 MG/2ML IJ SOLN
INTRAMUSCULAR | Status: AC
  Filled 2020-08-02: qty 2

## 2020-08-02 SURGICAL SUPPLY — 64 items
APL SKNCLS STERI-STRIP NONHPOA (GAUZE/BANDAGES/DRESSINGS)
BENZOIN TINCTURE PRP APPL 2/3 (GAUZE/BANDAGES/DRESSINGS) IMPLANT
BLADE EXTENDED COATED 6.5IN (ELECTRODE) IMPLANT
BLADE HEX COATED 2.75 (ELECTRODE) IMPLANT
BLADE SURG 10 STRL SS (BLADE) IMPLANT
BLADE SURG 15 STRL LF DISP TIS (BLADE) ×1 IMPLANT
BLADE SURG 15 STRL SS (BLADE) ×2
BRIEF STRETCH FOR OB PAD LRG (UNDERPADS AND DIAPERS) ×2 IMPLANT
CANISTER SUCT 3000ML PPV (MISCELLANEOUS) IMPLANT
COVER BACK TABLE 60X90IN (DRAPES) ×2 IMPLANT
COVER MAYO STAND STRL (DRAPES) ×2 IMPLANT
COVER WAND RF STERILE (DRAPES) ×2 IMPLANT
DECANTER SPIKE VIAL GLASS SM (MISCELLANEOUS) ×2 IMPLANT
DRAPE HYSTEROSCOPY (MISCELLANEOUS) ×2 IMPLANT
DRAPE LAPAROTOMY 100X72 PEDS (DRAPES) IMPLANT
DRAPE SHEET LG 3/4 BI-LAMINATE (DRAPES) ×2 IMPLANT
DRAPE UTILITY XL STRL (DRAPES) IMPLANT
DRSG PAD ABDOMINAL 8X10 ST (GAUZE/BANDAGES/DRESSINGS) ×2 IMPLANT
GAUZE SPONGE 4X4 12PLY STRL (GAUZE/BANDAGES/DRESSINGS) ×2 IMPLANT
GAUZE SPONGE 4X4 12PLY STRL LF (GAUZE/BANDAGES/DRESSINGS) ×2 IMPLANT
GLOVE INDICATOR 8.0 STRL GRN (GLOVE) ×2 IMPLANT
GLOVE SURG ENC MOIS LTX SZ7.5 (GLOVE) ×2 IMPLANT
GOWN STRL REUS W/TWL LRG LVL3 (GOWN DISPOSABLE) ×2 IMPLANT
HYDROGEN PEROXIDE 16OZ (MISCELLANEOUS) IMPLANT
IV CATH 14GX2 1/4 (CATHETERS) IMPLANT
IV CATH 18G SAFETY (IV SOLUTION) IMPLANT
KIT SIGMOIDOSCOPE (SET/KITS/TRAYS/PACK) IMPLANT
KIT TURNOVER CYSTO (KITS) ×2 IMPLANT
LEGGING LITHOTOMY PAIR STRL (DRAPES) ×2 IMPLANT
LOOP VESSEL MAXI BLUE (MISCELLANEOUS) IMPLANT
NDL SAFETY ECLIPSE 18X1.5 (NEEDLE) IMPLANT
NEEDLE HYPO 18GX1.5 SHARP (NEEDLE)
NEEDLE HYPO 22GX1.5 SAFETY (NEEDLE) ×2 IMPLANT
NEEDLE HYPO 25X1 1.5 SAFETY (NEEDLE) IMPLANT
NS IRRIG 500ML POUR BTL (IV SOLUTION) ×2 IMPLANT
PACK BASIN DAY SURGERY FS (CUSTOM PROCEDURE TRAY) ×2 IMPLANT
PENCIL SMOKE EVACUATOR (MISCELLANEOUS) ×2 IMPLANT
SPONGE HEMORRHOID 8X3CM (HEMOSTASIS) IMPLANT
SPONGE SURGIFOAM ABS GEL 12-7 (HEMOSTASIS) IMPLANT
SUCTION FRAZIER HANDLE 10FR (MISCELLANEOUS)
SUCTION TUBE FRAZIER 10FR DISP (MISCELLANEOUS) IMPLANT
SUT CHROMIC 2 0 SH (SUTURE) IMPLANT
SUT CHROMIC 3 0 SH 27 (SUTURE) IMPLANT
SUT MNCRL AB 4-0 PS2 18 (SUTURE) IMPLANT
SUT SILK 0 PSL NDL (SUTURE) IMPLANT
SUT SILK 0 TIES 10X30 (SUTURE) IMPLANT
SUT SILK 2 0 (SUTURE)
SUT SILK 2-0 18XBRD TIE 12 (SUTURE) IMPLANT
SUT VIC AB 2-0 SH 27 (SUTURE)
SUT VIC AB 2-0 SH 27XBRD (SUTURE) IMPLANT
SUT VIC AB 3-0 SH 18 (SUTURE) IMPLANT
SUT VIC AB 3-0 SH 27 (SUTURE)
SUT VIC AB 3-0 SH 27X BRD (SUTURE) IMPLANT
SUT VIC AB 3-0 SH 27XBRD (SUTURE) IMPLANT
SUT VIC AB 4-0 P-3 18XBRD (SUTURE) IMPLANT
SUT VIC AB 4-0 P3 18 (SUTURE)
SYR 20ML LL LF (SYRINGE) IMPLANT
SYR BULB IRRIG 60ML STRL (SYRINGE) ×2 IMPLANT
SYR CONTROL 10ML LL (SYRINGE) ×2 IMPLANT
SYR TB 1ML LL NO SAFETY (SYRINGE) ×2 IMPLANT
TOWEL OR 17X26 10 PK STRL BLUE (TOWEL DISPOSABLE) ×2 IMPLANT
TRAY DSU PREP LF (CUSTOM PROCEDURE TRAY) ×2 IMPLANT
TUBE CONNECTING 12X1/4 (SUCTIONS) ×2 IMPLANT
YANKAUER SUCT BULB TIP NO VENT (SUCTIONS) ×2 IMPLANT

## 2020-08-02 NOTE — H&P (Signed)
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 Prior to this, she denied any history of anal pain in the past. She denied any issues with defecation in the past. She reports that the last 2 years, she has been followed with gastroenterology including Dr. Fuller Plan and has been taking a daily fiber supplement and drinking plenty of water. Her stools have been soft. She has also been prescribed prolonged courses of diltiazem topical which she has applied for times daily as prescribed for multiple months with no real improvement in her symptoms. She will still have bright red blood per rectum with bowel movements and significant pain that is knifelike. This will last for 30-60 minutes after a bowel movement and then began to subside.  She had a colonoscopy completed 01/02/18 with Dr. Fuller Plan which showed small tubular adenoma was removed. Other small polyps were also removed. Posterior anal fissure was evident on his exam. Also with small hemorrhoids.  No history of Botox or dysport injections  She does report some degree of incontinence to urine if she coughs or sneezes. She currently denies any issues with incontinence to gas, liquid, or solid stool. She reports she has not had any issues with diarrhea in the past few years. She does report that when she had diarrhea in the past, she would have difficulty getting to the bathroom in time and was afraid of having accidents. Occasionally would have an accident. It hasn't been an issue since her anal fissure started.  PMH: HTN, HLD, GERD  PSH: She denies any prior anorectal procedures or surgeries. Prior cholecystectomy, appendectomy, hysterectomy.  FHx: Only known family history of malignancy as her paternal grandfather had colon cancer.  Social: Denies use of tobacco/EtOH/drugs. She is  happily retired but previously worked as an Glass blower/designer at a [employer]: A comprehensive 10 system review of systems was completed with the patient and pertinent findings as noted above.  Past Medical History:  Diagnosis Date  . Blood transfusion without reported diagnosis    2016 Kidney stone removal  . CAD (coronary artery disease)    a. 01/2010 Cath: mild atherosclerosis in the mid LCX after takeoff of OM1, mild diff LAD dzs;  b. 07/2013 MV: EF 67%, no ischemia.  . Depression   . Dyspnea on exertion    occ  . Heart murmur    mild  . Hemorrhoids   . History of kidney stones   . History of pneumonia 1969  . HLD (hyperlipidemia)   . Hypothyroidism   . Migraine headache   . Myocarditis ([place])    a. 01/2010 presented w/ c/p and elev trop-->Cath: mild atherosclerosis in the mid LCX after takeoff of OM1, mild diff LAD dzs;  b. 03/2010 Echo: EF 60-65%, DD, mild LVH;  c. 07/2011 Echo: EF 55-60%, mild LVH, no rwma.  . Pre-diabetes   . Prediabetes   . Sleep apnea    has c-pap.  wears off and on - not lately  using  per reported 07-29-2020  . Wears glasses     Past Surgical History:  Procedure Laterality Date  . APPENDECTOMY  1974  . BREAST SURGERY Right 1988   fatty tumore from breast  . CARDIAC CATHETERIZATION  8/11   no significant -[facility]- Dr Rockey Situ,  . CHOLECYSTECTOMY  2002  . COLONOSCOPY  2017  .  CYST EXCISION     ovarian cyst 1994  . NEPHROLITHOTOMY Right 08/20/2014   Procedure: RIGHT PERCUTANEOUS NEPHROLITHOTOMY ;  Surgeon: Malka So, MD;  Location: [facility];  Service: Urology;  Laterality: Right;  . POLYPECTOMY    . TOTAL ABDOMINAL HYSTERECTOMY W/ BILATERAL SALPINGOOPHORECTOMY  2000   no ovaries, uterus, or cervix.    Family History  Problem Relation Age of Onset  . Coronary artery disease Mother   . Diabetes Mother   . ALS Mother   . Hyperlipidemia Father   . Breast cancer Paternal Grandmother   . Lung cancer Paternal Grandfather        CAD  . Other  Daughter        gluten  . Down syndrome Son   . Colon cancer Maternal Grandfather        dx early 36's   . Breast cancer Sister 25  . Esophageal cancer Neg Hx   . Liver cancer Neg Hx   . Pancreatic cancer Neg Hx   . Rectal cancer Neg Hx   . Stomach cancer Neg Hx     Social:  reports that she quit smoking about 11 years ago. Her smoking use included cigarettes. She has a 12.25 pack-year smoking history. She has never used smokeless tobacco. She reports previous alcohol use. She reports that she does not use drugs.  Allergies:  Allergies  Allergen Reactions  . Pseudoeph-Doxylamine-Dm-Apap Anaphylaxis    Allergy to nyquil  . Gluten Other (See Comments)    Gi upset  . Penicillins Itching and Rash    Medications: I have reviewed the patient's current medications.  Results for orders placed or performed during the hospital encounter of 08/02/20 (from the past 48 hour(s))  I-STAT, chem 8     Status: Abnormal   Collection Time: 08/02/20  6:25 AM  Result Value Ref Range   Sodium 141 135 - 145 mmol/L   Potassium 4.0 3.5 - 5.1 mmol/L   Chloride 103 98 - 111 mmol/L   BUN 20 8 - 23 mg/dL   Creatinine, Ser 0.60 0.44 - 1.00 mg/dL   Glucose, Bld 140 (H) 70 - 99 mg/dL    Comment: Glucose reference range applies only to samples taken after fasting for at least 8 hours.   Calcium, Ion 1.32 1.15 - 1.40 mmol/L   TCO2 27 22 - 32 mmol/L   Hemoglobin 13.3 12.0 - 15.0 g/dL   HCT 39.0 36.0 - 46.0 %    No results found.  ROS - all of the below systems have been reviewed with the patient and positives are indicated with bold text General: chills, fever or night sweats Eyes: blurry vision or double vision ENT: epistaxis or sore throat Allergy/Immunology: itchy/watery eyes or nasal congestion Hematologic/Lymphatic: bleeding problems, blood clots or swollen lymph nodes Endocrine: temperature intolerance or unexpected weight changes Breast: new or changing breast lumps or nipple discharge Resp:  cough, shortness of breath, or wheezing CV: chest pain or dyspnea on exertion GI: as per HPI GU: dysuria, trouble voiding, or hematuria MSK: joint pain or joint stiffness Neuro: TIA or stroke symptoms Derm: pruritus and skin lesion changes Psych: anxiety and depression  PE Blood pressure 128/69, pulse 60, temperature 98.7 F (37.1 C), temperature source Oral, resp. rate 18, height 5\' 4"  (1.626 m), weight 79.7 kg, SpO2 97 %. Note: Constitutional: No acute distress; conversant; no deformities; wearing mask Eyes: Moist conjunctiva; no lid lag; anicteric sclerae; pupils equal and round Neck: Trachea midline; no  palpable thyromegaly Lungs: Normal respiratory effort; no tactile fremitus CV: rrr; no palpable thrill; no pitting edema GI: Abdomen soft, nontender, nondistended; no palpable hepatosplenomegaly Anorectal: No perianal skin changes; small external tags; appears to have a fairly hypertonic anal sphincter. Gentle effacement of the perianal skin was quite limited in its extent and visualization of the anal canal that she described fair amount discomfort. DRE/anoscopy was deferred at her request. MSK: Normal gait; no clubbing/cyanosis Psychiatric: Appropriate affect; alert and oriented 3 Lymphatic: No palpable cervical or axillary lymphadenopathy **A chaperone, Illene Regulus, was present for this encounter  Results for orders placed or performed during the hospital encounter of 08/02/20 (from the past 48 hour(s))  I-STAT, chem 8     Status: Abnormal   Collection Time: 08/02/20  6:25 AM  Result Value Ref Range   Sodium 141 135 - 145 mmol/L   Potassium 4.0 3.5 - 5.1 mmol/L   Chloride 103 98 - 111 mmol/L   BUN 20 8 - 23 mg/dL   Creatinine, Ser 0.60 0.44 - 1.00 mg/dL   Glucose, Bld 140 (H) 70 - 99 mg/dL    Comment: Glucose reference range applies only to samples taken after fasting for at least 8 hours.   Calcium, Ion 1.32 1.15 - 1.40 mmol/L   TCO2 27 22 - 32 mmol/L   Hemoglobin  13.3 12.0 - 15.0 g/dL   HCT 39.0 36.0 - 46.0 %    No results found.   A/P: Brandi Mahoney is a very pleasant 41yoF with hx of HTN, HLD, GERD, hypothyroidism here for evaluation of chronic knifelike anal pains with BMs - had a fissure noted on exam with Dr. Fuller Plan 2019 at symptom onset. Has persisted despite daily fiber and prolonged courses of topical diltiazem Impression: -The anatomy and physiology of the anal canal was discussed at length with the patient. The pathophysiology of anal fissures was discussed as well -We have discussed options going forward which would be further observation vs procedures - Botox vs internal sphincterotomy. Given her hx of urgency and even occasional accidents with liquid stool, going in a stepwise manner beginning with Botox. She is interested in this. We discussed injection of perianal botox with anorectal exam under anesthesia/anoscopy. -The planned procedure, material risks (including, but not limited to, pain, bleeding, infection, damage to anal sphincter, incontinence of gas and/or stool although should be temporary if occurs, recurrence/persistence of fissure, pneumonia, heart attack, stroke, death) benefits and alternatives to surgery were discussed at length. We noted a good probability the procedure would help her symptoms and 60-80% chance of needing no further treatment for the anal fissure. The patient's questions were answered to her satisfaction, she voiced understanding and elected to proceed with the procedure. Additionally, we discussed typical postoperative expectations and the recovery process.  Sharon Mt. Dema Severin, M.D. [facility] Use [email or web address] to contact on call provider

## 2020-08-02 NOTE — Anesthesia Postprocedure Evaluation (Signed)
Anesthesia Post Note  Patient: Brandi Mahoney  Procedure(s) Performed: [name] UNDER ANESTHESIA INJECTION OF PERIANAL BOTOX (N/A Rectum)     Patient location during evaluation: PACU Anesthesia Type: MAC Level of consciousness: awake and alert Pain management: pain level controlled Vital Signs Assessment: post-procedure vital signs reviewed and stable Respiratory status: spontaneous breathing, nonlabored ventilation, respiratory function stable and patient connected to nasal cannula oxygen Cardiovascular status: stable and blood pressure returned to baseline Postop Assessment: no apparent nausea or vomiting Anesthetic complications: no   No complications documented.  Last Vitals:  Vitals:   08/02/20 0915 08/02/20 1013  BP: 101/66 105/66  Pulse: 66 67  Resp: 20 14  Temp:  36.7 C  SpO2: 95% 96%    Last Pain:  Vitals:   08/02/20 1013  TempSrc:   PainSc: 0-No pain                 Ervie Mccard P Darian Ace

## 2020-08-02 NOTE — Op Note (Signed)
08/02/2020  8:40 AM  PATIENT:  Brandi Mahoney  65 y.o. female  Patient Care Team: Owens Loffler, MD as PCP - General Byrnett, Forest Gleason, MD (General Surgery) Ammie Dalton, Okey Regal, MD (Unknown Physician Specialty)  PRE-OPERATIVE DIAGNOSIS:  Anodynia, anal fissure  POST-OPERATIVE DIAGNOSIS:  Same  PROCEDURE:  1. Injection of perianal botox 2. Anorectal exam under anesthesia  SURGEON:  Surgeon(s): Ileana Roup, MD  ANESTHESIA:   local and MAC  SPECIMEN:  No Specimen  DISPOSITION OF SPECIMEN:  N/A  COUNTS:  Sponge, needle, and instrument counts were reported correct x2 at conclusion.  EBL: 1 mL  PLAN OF CARE: Discharge to home after PACU  PATIENT DISPOSITION:  PACU - hemodynamically stable.  OR FINDINGS: Constipation with 'pellet' stools in rectal vault. Scarring and shallow based fissure posterior midline with sentinel pile. Small 3 column mixed internal/external hemorrhoids  DESCRIPTION: The patient was identified in the preoperative holding area and taken to the OR where she was placed on the operating room table. SCDs were placed.  MAC anesthesia was induced without difficulty. The patient was then positioned in high lithotomy with Allen stirrups. Pressure points were then evaluated and padded.  She was then prepped and draped in usual sterile fashion.  A surgical timeout was performed indicating the correct patient, procedure, and positioning.  A perianal block was performed using a dilute mixture of 0.25% Marcaine with epinephrine and Exparel.   After ascertaining that an appropriate level of anesthesia had been achieved, a well lubricated digital rectal exam was performed. This demonstrated no palpable masses; firm hard stool balls in the rectal vault.  A Hill-Ferguson anoscope was into the anal canal and circumferential inspection demonstrated health appearing anoderm with mixed internal/external hemorrhoids. Scarring and shallow based fissure in posterior  midline. Sentinel pile at this location as well. 100U of botox was diluted in 3 cc of injectable saline. The intersphincteric groove was identified. The Botox was then injected circumferentially in this plane up to the level of the dentate line. Hemostasis was then observed in the anal canal. Sponge/needle/instrument counts were reported correct x2. A dressing consisting of 4x4s/ABD/mesh underwear was placed. She was taken out of lithotomy, awakened, and transferred to a stretcher for transport to PACU in satisfactory condition having tolerated the procedure well.  DISPOSITION: PACU in satisfactory condition.

## 2020-08-02 NOTE — Transfer of Care (Signed)
Immediate Anesthesia Transfer of Care Note  Patient: Brandi Mahoney  Procedure(s) Performed: [name] UNDER ANESTHESIA INJECTION OF PERIANAL BOTOX (N/A Rectum)  Patient Location: PACU  Anesthesia Type:MAC  Level of Consciousness: drowsy, patient cooperative and responds to stimulation  Airway & Oxygen Therapy: Patient Spontanous Breathing and Patient connected to face mask oxygen  Post-op Assessment: Report given to RN and Post -op Vital signs reviewed and stable  Post vital signs: Reviewed and stable  Last Vitals:  Vitals Value Taken Time  BP 105/66 08/02/20 1013  Temp 36.7 C 08/02/20 1013  Pulse 67 08/02/20 1013  Resp 14 08/02/20 1013  SpO2 96 % 08/02/20 1013    Last Pain:  Vitals:   08/02/20 1013  TempSrc:   PainSc: 0-No pain      Patients Stated Pain Goal: 5 (69/62/95 2841)  Complications: No complications documented.

## 2020-08-02 NOTE — Discharge Instructions (Addendum)
ANORECTAL SURGERY: POST OP INSTRUCTIONS  1. DIET: Follow a light bland diet the first 24 hours after arrival home, such as soup, liquids, crackers, etc.  Be sure to include lots of fluids daily.  Avoid fast food or heavy meals as your are more likely to get nauseated.  Eat a low fat diet the next few days after surgery.   2. Some bleeding with bowel movements is expected for the first couple of days but this should stop in between bowel movements  3. Take your usually prescribed home medications unless otherwise directed.  4. PAIN CONTROL: a. It is helpful to take an over-the-counter pain medication regularly for the first few days/weeks.  Choose from the following that works best for you: i. Ibuprofen (Advil, etc) Three 200mg  tabs every 6 hours as needed. ii. Acetaminophen (Tylenol, etc) 500-650mg  every 6 hours as needed iii. NOTE: You may take both of these medications together - most patients find it most helpful when alternating between the two (i.e. Ibuprofen at 6am, tylenol at 9am, ibuprofen at 12pm ...)  5. Avoid getting constipated.  It was apparent during your procedure you have underlying constipation. It will be very beneficial to ensure your are getting plenty of water (64oz of water per day) and taking a fiber supplement (such as Metamucil, Citrucel, FiberCon) 1-2 times a day indefinitely to help prevent this problem from occurring. This will also help with the hemorrhoidal issues you may have had to the point that they are no longer apparent. Avoid enemas if possible as these are often painful.   6. Watch out for diarrhea.  If you have many loose bowel movements, simplify your diet to bland foods.  Stop any stool softeners and decrease your fiber supplement. If this worsens or does not improve, please call us.  7. Wash / shower every day.  You may shower normally, getting soap/water on your wound, particularly after bowel movements starting today  8. Soaking in a warm bath filled a  couple inches ("Sitz bath") is a great way to clean the area after a bowel movement and many patients find it is a way to soothe the area.  9. ACTIVITIES as tolerated:   a. You may resume regular (light) daily activities beginning the next day--such as daily self-care, walking, climbing stairs--gradually increasing activities as tolerated.  If you can walk 30 minutes without difficulty, it is safe to try more intense activity such as jogging, treadmill, bicycling, low-impact aerobics, etc. b. Refrain from any heavy lifting or straining for the first 2 weeks after your procedure, particularly if your surgery was for hemorrhoids. c. Avoid activities that make your pain worse d. You may drive when you are no longer taking prescription pain medication, you can comfortably wear a seatbelt, and you can safely maneuver your car and apply brakes.  10. FOLLOW UP in our office a. Please call [facility] at [phone] to set up an appointment to see your surgeon in the office for a follow-up appointment approximately 2-4 weeks after your surgery. b. Make sure that you call for this appointment the day you arrive home to insure a convenient appointment time.  9. If you have disability or family leave forms that need to be completed, you may have them completed by your primary care physician's office; for return to work instructions, please ask our office staff and they will be happy to assist you in obtaining this documentation   When to call us [phone]: 1. Poor pain control  2. Difficulty breathing or shortness or breath 3. Reactions / problems with new medications (rash/itching, etc)  4. Fever over 101.5 F (38.5 C) 5. Inability to urinate 6. Nausea/vomiting 7. Worsening swelling or bruising  The clinic staff is available to answer your questions during regular business hours (8:30am-5pm).  Please dont hesitate to call and ask to speak to one of our nurses for clinical concerns.   A surgeon from  [facility] is always on call at the hospitals   If you have a medical emergency, go to the nearest emergency room or call 911.   [facility], [address], [place], [place], [place]  [address] ? MAIN: [phone] FAX [phone] [email or web address]     Post Anesthesia Home Care Instructions  Activity: Get plenty of rest for the remainder of the day. A responsible individual must stay with you for 24 hours following the procedure.  For the next 24 hours, DO NOT: -Drive a car -Paediatric nurse -Drink alcoholic beverages -Take any medication unless instructed by your physician -Make any legal decisions or sign important papers.  Meals: Start with liquid foods such as gelatin or soup. Progress to regular foods as tolerated. Avoid greasy, spicy, heavy foods. If nausea and/or vomiting occur, drink only clear liquids until the nausea and/or vomiting subsides. Call your physician if vomiting continues.  Special Instructions/Symptoms: Your throat may feel dry or sore from the anesthesia or the breathing tube placed in your throat during surgery. If this causes discomfort, gargle with warm salt water. The discomfort should disappear within 24 hours.    Do not take any advil, ibuprofen, motrin, or aleve until after 3:00 pm today.

## 2020-08-03 ENCOUNTER — Encounter (HOSPITAL_BASED_OUTPATIENT_CLINIC_OR_DEPARTMENT_OTHER): Payer: Self-pay | Admitting: Surgery

## 2020-09-19 ENCOUNTER — Encounter: Payer: Self-pay | Admitting: Family Medicine

## 2020-09-19 NOTE — Progress Notes (Signed)
Brandi Zenz T. Sylvanus Telford, MD, [place] at [facility] [place], [address]  Phone: [phone]  FAX: [phone]  Brandi Mahoney - 65 y.o. female  MRN [id number]  Date of Birth: [birth year]  Date: 09/20/2020  PCP: Brandi Loffler, MD  Referral: Brandi Loffler, MD  Chief Complaint  Patient presents with  . Lump on Collar Bone    This visit occurred during the SARS-CoV-2 public health emergency.  Safety protocols were in place, including screening questions prior to the visit, additional usage of staff PPE, and extensive cleaning of exam room while observing appropriate contact time as indicated for disinfecting solutions.   Subjective:   Brandi Mahoney is a 65 y.o. very pleasant female patient with Body mass index is 30.04 kg/m. who presents with the following:  Brandi Mahoney presents with an area of concern at her collarbone.  She was in her general overall good state of health when several weeks ago she recognizes that her medial clavicle/sternoclavicular joint became enlarged.  It does not have any pain.  She does not have any known injury whatsoever.  She has not had any known new infection.  There is no surrounding rash.  She denies any numbness, tingling or other pain about the scapular as well as the neck.  She otherwise has been reasonably stable medically, and she does not have any known malignancy.  Specifically, her CBC and differential have been normal.  Her last colonoscopy was in 2019 with one small adenomatous polyp with recommendation for follow-up in 5 years. Her most recent mammogram was on October, 2021, and this was normal and unremarkable She has had a total abdominal hysterectomy.  She did remotely smoke, but she quit approximately 15 years ago.  With a total pack years of roughly 12 years.  Review of Systems is noted in the HPI, as appropriate  Patient Active  Problem List   Diagnosis Date Noted  . Coronary artery disease involving native coronary artery of native heart with angina pectoris [facility])     Priority: High  . Mixed hyperlipidemia 04/01/2010    Priority: High  . HYPERTRIGLYCERIDEMIA 07/21/2009    Priority: High  . Hypothyroidism in adult 09/08/2008    Priority: High  . Obstructive sleep apnea 09/25/2013    Priority: Medium  . Tobacco abuse, in remission 09/19/2013    Priority: Medium  . Major depressive disorder, recurrent episode, in full remission ([place]) 09/08/2008    Priority: Medium  . Lichen sclerosus 19/37/9024  . Type 2 diabetes, diet controlled ([place])   . GAD (generalized anxiety disorder)   . IBS (irritable bowel syndrome) 09/26/2012  . 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 ASTHMA 09/08/2008    Past Medical History:  Diagnosis Date  . CAD (coronary artery disease)    a. 01/2010 Cath: mild atherosclerosis in the mid LCX after takeoff of OM1, mild diff LAD dzs;  b. 07/2013 MV: EF 67%, no ischemia.  Marland Kitchen GAD (generalized anxiety disorder)   . History of kidney stones   . HLD (hyperlipidemia)   . Hypothyroidism   . Major depressive disorder, recurrent episode, in full remission ([place]) 09/08/2008   Qualifier: Diagnosis of  By: Lorelei Pont MD, Frederico Hamman    . Migraine headache   . Myocarditis ([place])    a. 01/2010 presented w/ c/p and elev trop-->Cath: mild atherosclerosis in the mid LCX after takeoff of OM1, mild diff LAD dzs;  b. 03/2010 Echo:  EF 60-65%, DD, mild LVH;  c. 07/2011 Echo: EF 55-60%, mild LVH, no rwma.  . Sleep apnea    has c-pap.  wears off and on - not lately  using  per reported 07-29-2020  . Type 2 diabetes, diet controlled ([place])     Past Surgical History:  Procedure Laterality Date  . APPENDECTOMY  1974  . BREAST SURGERY Right 1988   fatty tumore from breast  . CARDIAC CATHETERIZATION  8/11   no significant -[facility]- Dr Rockey Situ,  . CHOLECYSTECTOMY  2002  . COLONOSCOPY  2017  . CYST EXCISION     ovarian cyst  1994  . NEPHROLITHOTOMY Right 08/20/2014   Procedure: RIGHT PERCUTANEOUS NEPHROLITHOTOMY ;  Surgeon: Malka So, MD;  Location: [facility];  Service: Urology;  Laterality: Right;  . POLYPECTOMY    . RECTAL EXAM UNDER ANESTHESIA N/A 08/02/2020   Procedure: [name] UNDER ANESTHESIA INJECTION OF PERIANAL BOTOX;  Surgeon: Ileana Roup, MD;  Location: [facility];  Service: General;  Laterality: N/A;  . TOTAL ABDOMINAL HYSTERECTOMY W/ BILATERAL SALPINGOOPHORECTOMY  2000   no ovaries, uterus, or cervix.    Family History  Problem Relation Age of Onset  . Coronary artery disease Mother   . Diabetes Mother   . ALS Mother   . Hyperlipidemia Father   . Breast cancer Paternal Grandmother   . Lung cancer Paternal Grandfather        CAD  . Other Daughter        gluten  . Down syndrome Son   . Colon cancer Maternal Grandfather        dx early 49's   . Breast cancer Sister 62  . Esophageal cancer Neg Hx   . Liver cancer Neg Hx   . Pancreatic cancer Neg Hx   . Rectal cancer Neg Hx   . Stomach cancer Neg Hx      Objective:   BP 110/60   Pulse 70   Temp (!) 96.8 F (36 C) (Temporal)   Ht 5\' 4"  (1.626 m)   Wt 175 lb (79.4 kg)   SpO2 97%   BMI 30.04 kg/m   GEN: No acute distress; alert,appropriate. PULM: Breathing comfortably in no respiratory distress PSYCH: Normally interactive.     As per the picture, the medial clavicle is more prominent compared to the lateral as well as notably bigger than the right medial clavicle.  It is nontender to palpation.  Per the patient and her husband this is notably different compared to how it was previously.  Laboratory and Imaging Data: Results for orders placed or performed in visit on 09/20/20  High sensitivity CRP  Result Value Ref Range   CRP, High Sensitivity 6.930 (H) 0.000 - 5.000 mg/L  Sedimentation rate  Result Value Ref Range   Sed Rate 13 0 - 30 mm/hr  Basic metabolic panel  Result Value Ref Range    Sodium 139 135 - 145 mEq/L   Potassium 4.4 3.5 - 5.1 mEq/L   Chloride 102 96 - 112 mEq/L   CO2 31 19 - 32 mEq/L   Glucose, Bld 123 (H) 70 - 99 mg/dL   BUN 12 6 - 23 mg/dL   Creatinine, Ser 0.70 0.40 - 1.20 mg/dL   GFR 91.46 >60.00 mL/min   Calcium 9.7 8.4 - 10.5 mg/dL  Lipid panel  Result Value Ref Range   Cholesterol 147 0 - 200 mg/dL   Triglycerides 385.0 (H) 0.0 - 149.0 mg/dL  HDL 42.40 >39.00 mg/dL   VLDL 77.0 (H) 0.0 - 40.0 mg/dL   Total CHOL/HDL Ratio 3    NonHDL 104.53   Hemoglobin A1c  Result Value Ref Range   Hgb A1c MFr Bld 7.0 (H) 4.6 - 6.5 %  TSH  Result Value Ref Range   TSH 5.96 (H) 0.35 - 4.50 uIU/mL  Uric acid  Result Value Ref Range   Uric Acid, Serum 7.7 (H) 2.4 - 7.0 mg/dL  LDL cholesterol, direct  Result Value Ref Range   Direct LDL 57.0 mg/dL    DG Clavicle Left  Result Date: 09/20/2020 CLINICAL DATA:  New enlargement of sternoclavicular joint. EXAM: LEFT CLAVICLE - 2+ VIEWS COMPARISON:  None. FINDINGS: No fracture or dislocation identified. Evaluation of the sternoclavicular region is somewhat limited on x-rays. No obvious abnormality in this region. No other bony or soft tissue abnormalities are noted. IMPRESSION: Evaluation of the sternoclavicular region is somewhat limited on ax raise. No obvious abnormalities on this film. CT or MRI imaging could better evaluate this region if concern persists. Electronically Signed   By: Dorise Bullion III M.D   On: 09/20/2020 16:01     Assessment and Plan:     ICD-10-CM   1. Clavicle enlargement  M89.319   2. Abnormal growth of collarbone  D49.2 DG Clavicle Left    High sensitivity CRP    Sedimentation rate    Rheumatoid factor    ANA    Basic metabolic panel    Cyclic citrul peptide antibody, IgG    Uric acid  3. Type 2 diabetes, diet controlled ([facility])  T36.4 Basic metabolic panel    Hemoglobin A1c  4. Mixed hyperlipidemia  E78.2 Lipid panel  5. Hypothyroidism in adult  E03.9 TSH   Total encounter time:  40 minutes. This includes total time spent on the day of encounter.  I spent additional time over and above face-to-face office visit and coordination of care, primary review of x-ray myself, and verbal discussion with radiology.  Certainly multiple etiologies are possible.  At this point, her baseline laboratories have returned and are normal, as she does have some rheumatological labs that are pending.  No trauma.  Infection possible.  At this point, I do think that the main issue would be to rule out potential malignancy.  I verbally spoke with radiology, and radiology also agrees and their formal report that additional imaging would be appropriate.  Obtain a MRI of the chest with attention to the medial clavicle for evaluation of potential malignancy.  Meds ordered this encounter  Medications  . diazepam (VALIUM) 5 MG tablet    Sig: Take 1 tab po 45 minutes prior to MRI    Dispense:  1 tablet    Refill:  0   Medications Discontinued During This Encounter  Medication Reason  . Fremanezumab-vfrm (AJOVY) 225 MG/1.5ML SOAJ Cost of medication  . magnesium gluconate (MAGONATE) 500 MG tablet Cost of medication   Orders Placed This Encounter  Procedures  . DG Clavicle Left  . High sensitivity CRP  . Sedimentation rate  . Rheumatoid factor  . ANA  . Basic metabolic panel  . Cyclic citrul peptide antibody, IgG  . Lipid panel  . Hemoglobin A1c  . TSH  . Uric acid  . LDL cholesterol, direct    Follow-up: No follow-ups on file.  Signed,  Maud Deed. Cyan Clippinger, MD   Outpatient Encounter Medications as of 09/20/2020  Medication Sig  . AMBULATORY NON FORMULARY MEDICATION Medication  Name: Nitroglycerin gel 0.125 % please apply pea sized amount to rectum three times a day x 6-8 weeks  . aspirin EC 81 MG tablet Take 81 mg by mouth daily.  . clobetasol cream (TEMOVATE) 0.05 % APPLY TWO TIMES A DAY FOR 2 WEEKS THEN USE AS NEEDED FOR LICHEN SCLEROSIS  . cyclobenzaprine (FLEXERIL) 10 MG tablet  TAKE 1 TABLET (10 MG TOTAL) BY MOUTH 2 (TWO) TIMES DAILY AS NEEDED (PAIN).  Marland Kitchen diazepam (VALIUM) 5 MG tablet Take 1 tab po 45 minutes prior to MRI  . fenofibrate 160 MG tablet TAKE ONE TABLET BY MOUTH DAILY (Patient taking differently: daily.)  . gabapentin (NEURONTIN) 300 MG capsule Take 1 capsule (300 mg total) by mouth at bedtime.  Marland Kitchen glucose blood (ACCU-CHEK AVIVA) test strip Use to check blood sugar once daily  . Lactobacillus-Inulin (CULTURELLE ADULT ULT BALANCE PO)   . levothyroxine (SYNTHROID) 75 MCG tablet TAKE ONE TABLET BY MOUTH EVERY MORNING WITH BREAKFAST  . Magnesium 250 MG TABS Take 1 tablet by mouth daily.  . metoprolol succinate (TOPROL-XL) 50 MG 24 hr tablet Take 1 tablet (50 mg total) by mouth daily. Take with or immediately following a meal. (Patient taking differently: Take 50 mg by mouth every evening. Take with or immediately following a meal.)  . Multiple Vitamins-Minerals (ONE-A-DAY WOMENS PETITES PO) Take 2 tablets by mouth daily.  . nitroGLYCERIN (NITROSTAT) 0.4 MG SL tablet 1 tablet under tongue at onset of chest pain; you may repeat every 5 minutes for up to 3 doses.  . pantoprazole (PROTONIX) 40 MG tablet TAKE ONE TABLET BY MOUTH DAILY  . pravastatin (PRAVACHOL) 40 MG tablet TAKE ONE TABLET BY MOUTH AT BEDTIME  . sertraline (ZOLOFT) 50 MG tablet TAKE ONE TABLET BY MOUTH DAILY  . traZODone (DESYREL) 100 MG tablet Take 1-2 tablets (100-200 mg total) by mouth at bedtime as needed for sleep.  . [DISCONTINUED] Fremanezumab-vfrm (AJOVY) 225 MG/1.5ML SOAJ Inject into the skin. Every 30 days  . [DISCONTINUED] magnesium gluconate (MAGONATE) 500 MG tablet Take 500 mg by mouth daily.    No facility-administered encounter medications on file as of 09/20/2020.

## 2020-09-20 ENCOUNTER — Ambulatory Visit (INDEPENDENT_AMBULATORY_CARE_PROVIDER_SITE_OTHER)
Admission: RE | Admit: 2020-09-20 | Discharge: 2020-09-20 | Disposition: A | Discharge status: Home / Self Care | Payer: 59 | Source: Ambulatory Visit | Attending: Family Medicine | Admitting: Family Medicine

## 2020-09-20 ENCOUNTER — Encounter: Payer: Self-pay | Admitting: Family Medicine

## 2020-09-20 ENCOUNTER — Other Ambulatory Visit: Payer: Self-pay

## 2020-09-20 ENCOUNTER — Ambulatory Visit (INDEPENDENT_AMBULATORY_CARE_PROVIDER_SITE_OTHER): Payer: 59 | Admitting: Family Medicine

## 2020-09-20 VITALS — BP 110/60 | HR 70 | Temp 96.8°F | Ht 64.0 in | Wt 175.0 lb

## 2020-09-20 DIAGNOSIS — D492 Neoplasm of unspecified behavior of bone, soft tissue, and skin: Secondary | ICD-10-CM

## 2020-09-20 DIAGNOSIS — E119 Type 2 diabetes mellitus without complications: Secondary | ICD-10-CM

## 2020-09-20 DIAGNOSIS — M89319 Hypertrophy of bone, unspecified shoulder: Secondary | ICD-10-CM

## 2020-09-20 DIAGNOSIS — E782 Mixed hyperlipidemia: Secondary | ICD-10-CM

## 2020-09-20 DIAGNOSIS — E039 Hypothyroidism, unspecified: Secondary | ICD-10-CM | POA: Diagnosis not present

## 2020-09-20 LAB — SEDIMENTATION RATE: Sed Rate: 13 mm/hr (ref 0–30)

## 2020-09-20 LAB — LIPID PANEL
Cholesterol: 147 mg/dL (ref 0–200)
HDL: 42.4 mg/dL (ref >39.00)
NonHDL: 104.53
Total CHOL/HDL Ratio: 3
Triglycerides: 385 mg/dL — ABNORMAL HIGH (ref 0.0–149.0)
VLDL: 77 mg/dL — ABNORMAL HIGH (ref 0.0–40.0)

## 2020-09-20 LAB — BASIC METABOLIC PANEL
BUN: 12 mg/dL (ref 6–23)
CO2: 31 mEq/L (ref 19–32)
Calcium: 9.7 mg/dL (ref 8.4–10.5)
Chloride: 102 mEq/L (ref 96–112)
Creatinine, Ser: 0.7 mg/dL (ref 0.40–1.20)
GFR: 91.46 mL/min (ref >60.00)
Glucose, Bld: 123 mg/dL — ABNORMAL HIGH (ref 70–99)
Potassium: 4.4 mEq/L (ref 3.5–5.1)
Sodium: 139 mEq/L (ref 135–145)

## 2020-09-20 LAB — URIC ACID: Uric Acid, Serum: 7.7 mg/dL — ABNORMAL HIGH (ref 2.4–7.0)

## 2020-09-20 LAB — TSH: TSH: 5.96 u[IU]/mL — ABNORMAL HIGH (ref 0.35–4.50)

## 2020-09-20 LAB — HEMOGLOBIN A1C: Hgb A1c MFr Bld: 7 % — ABNORMAL HIGH (ref 4.6–6.5)

## 2020-09-20 LAB — LDL CHOLESTEROL, DIRECT: Direct LDL: 57 mg/dL

## 2020-09-20 LAB — HIGH SENSITIVITY CRP: CRP, High Sensitivity: 6.93 mg/L — ABNORMAL HIGH (ref 0.000–5.000)

## 2020-09-20 IMAGING — DX DG CLAVICLE*L*
2 series · 2 of 2 positions shown · non-contrast
Comparison: None.

CLINICAL DATA: New enlargement of sternoclavicular joint.

EXAM:
LEFT CLAVICLE - 2+ VIEWS

[clavicle ap]
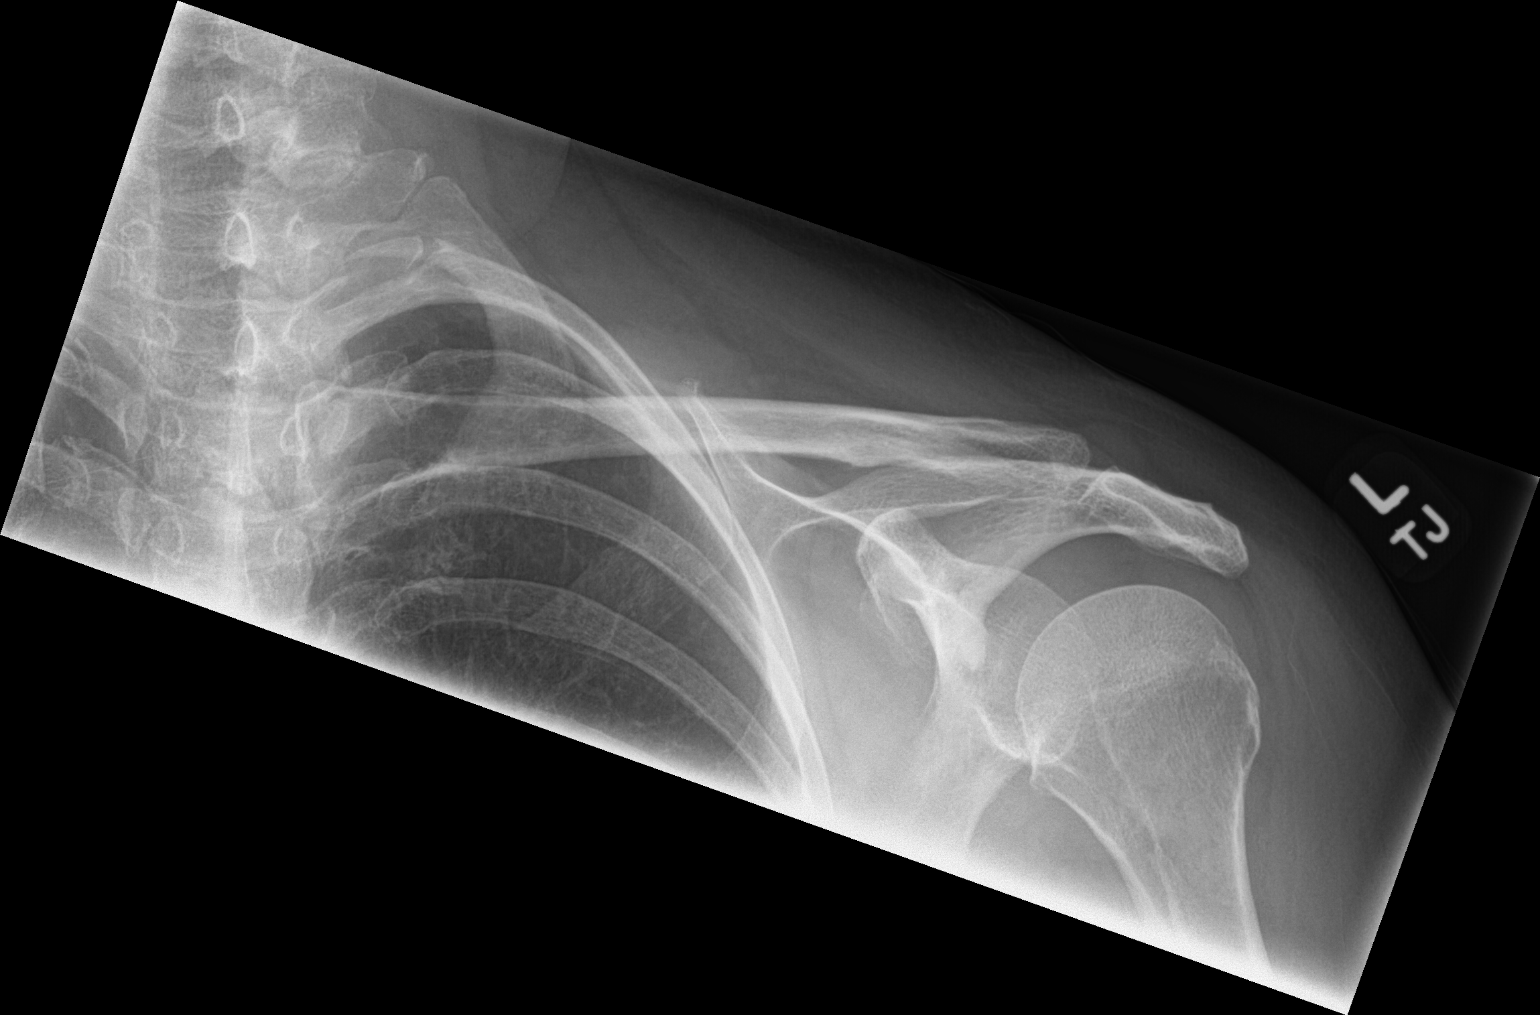

[clavicle axial]
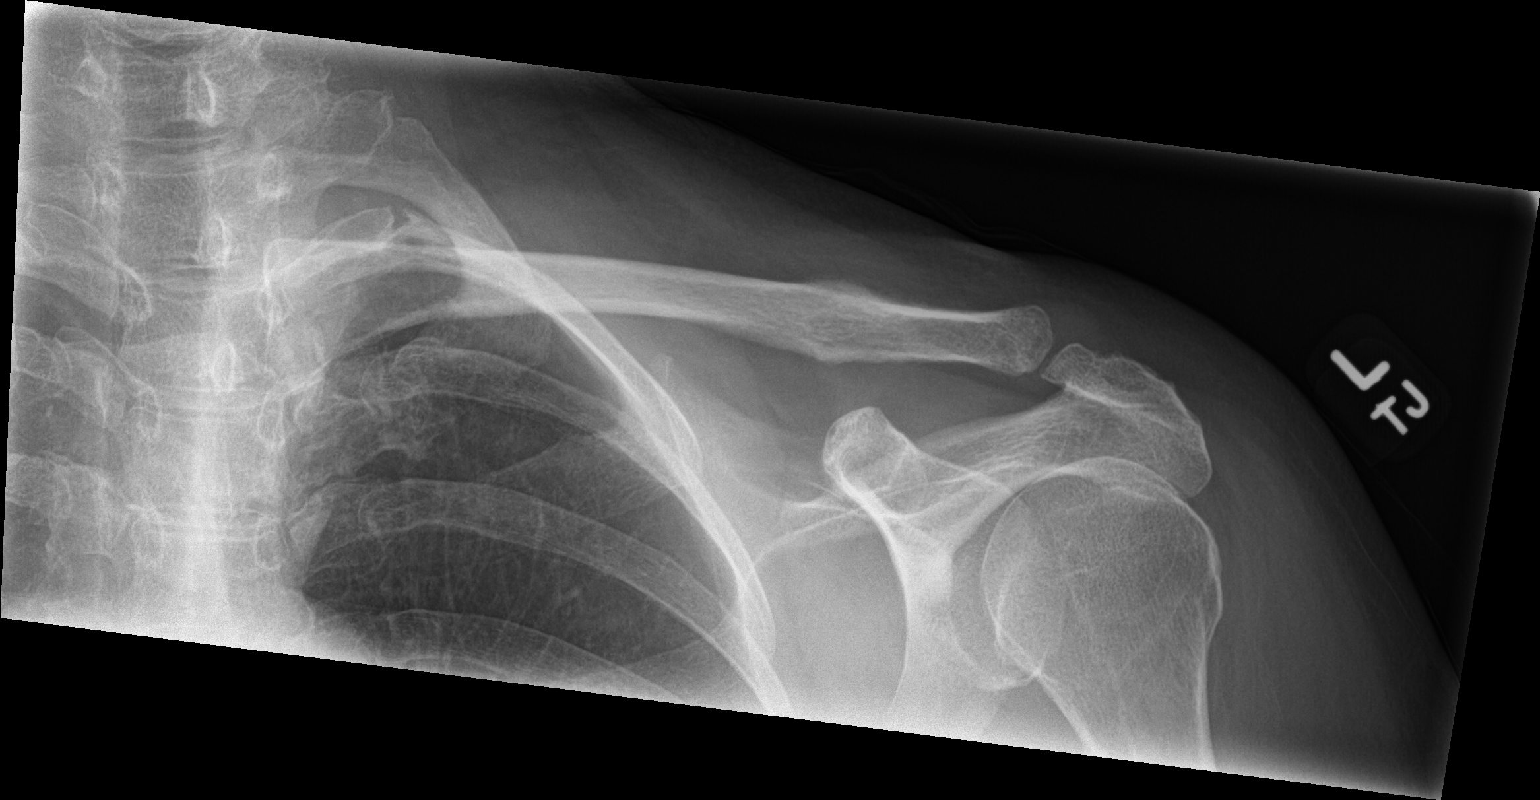

[2 of 2 positions shown; findings below may reference images not displayed]

FINDINGS: No fracture or dislocation identified. Evaluation of the
sternoclavicular region is somewhat limited on x-rays. No obvious
abnormality in this region. No other bony or soft tissue
abnormalities are noted.
IMPRESSION: Evaluation of the sternoclavicular region is somewhat limited on ax
raise. No obvious abnormalities on this film. CT or MRI imaging
could better evaluate this region if concern persists.

## 2020-09-20 MED ORDER — DIAZEPAM 5 MG PO TABS: ORAL_TABLET | ORAL | 0 refills | Status: DC

## 2020-09-21 ENCOUNTER — Other Ambulatory Visit (INDEPENDENT_AMBULATORY_CARE_PROVIDER_SITE_OTHER): Payer: 59

## 2020-09-21 DIAGNOSIS — D492 Neoplasm of unspecified behavior of bone, soft tissue, and skin: Secondary | ICD-10-CM | POA: Diagnosis not present

## 2020-09-21 LAB — CBC WITH DIFFERENTIAL/PLATELET
Basophils Absolute: 0.1 10*3/uL (ref 0.0–0.1)
Basophils Relative: 0.8 % (ref 0.0–3.0)
Eosinophils Absolute: 0.2 10*3/uL (ref 0.0–0.7)
Eosinophils Relative: 3.1 % (ref 0.0–5.0)
HCT: 39.9 % (ref 36.0–46.0)
Hemoglobin: 13.1 g/dL (ref 12.0–15.0)
Lymphocytes Relative: 26.9 % (ref 12.0–46.0)
Lymphs Abs: 1.7 10*3/uL (ref 0.7–4.0)
MCHC: 32.7 g/dL (ref 30.0–36.0)
MCV: 81.6 fl (ref 78.0–100.0)
Monocytes Absolute: 0.5 10*3/uL (ref 0.1–1.0)
Monocytes Relative: 8.5 % (ref 3.0–12.0)
Neutro Abs: 3.8 10*3/uL (ref 1.4–7.7)
Neutrophils Relative %: 60.7 % (ref 43.0–77.0)
Platelets: 211 10*3/uL (ref 150.0–400.0)
RBC: 4.89 Mil/uL (ref 3.87–5.11)
RDW: 14.7 % (ref 11.5–15.5)
WBC: 6.2 10*3/uL (ref 4.0–10.5)

## 2020-09-21 NOTE — Addendum Note (Signed)
Addended by: Kris Mouton on: 09/21/2020 04:45 PM   Modules accepted: Orders

## 2020-09-23 LAB — ANTI-NUCLEAR AB-TITER (ANA TITER): ANA Titer 1: 1:40 {titer} — ABNORMAL HIGH

## 2020-09-23 LAB — RHEUMATOID FACTOR: Rheumatoid fact SerPl-aCnc: <14 IU/mL (ref <14)

## 2020-09-23 LAB — CYCLIC CITRUL PEPTIDE ANTIBODY, IGG: Cyclic Citrullin Peptide Ab: <16 UNITS

## 2020-09-23 LAB — ANA: Anti Nuclear Antibody (ANA): POSITIVE — AB

## 2020-09-25 ENCOUNTER — Other Ambulatory Visit: Payer: Self-pay | Admitting: Family Medicine

## 2020-09-25 DIAGNOSIS — G4733 Obstructive sleep apnea (adult) (pediatric): Secondary | ICD-10-CM

## 2020-09-26 NOTE — Telephone Encounter (Signed)
Last office visit 09/20/20 for Clavicle enlargement.  Last refilled 08/25/19 for #90 with 3 refills.  No future appointments with PCP.

## 2020-10-02 ENCOUNTER — Other Ambulatory Visit: Payer: Self-pay | Admitting: Family Medicine

## 2020-10-10 ENCOUNTER — Ambulatory Visit
Admission: RE | Admit: 2020-10-10 | Discharge: 2020-10-10 | Disposition: A | Discharge status: Home / Self Care | Payer: 59 | Source: Ambulatory Visit | Attending: Family Medicine | Admitting: Family Medicine

## 2020-10-10 ENCOUNTER — Other Ambulatory Visit: Payer: Self-pay

## 2020-10-10 DIAGNOSIS — D492 Neoplasm of unspecified behavior of bone, soft tissue, and skin: Secondary | ICD-10-CM

## 2020-10-10 DIAGNOSIS — M89319 Hypertrophy of bone, unspecified shoulder: Secondary | ICD-10-CM

## 2020-10-10 IMAGING — MR MR CHEST MEDIASTINUM WO/W CM
10 series · 16 of 16 positions shown · IV contrast (16 ml multihance)
Comparison: None.

CLINICAL DATA: Lump on the medial aspect of the left clavicle for
3-4 weeks.

EXAM:
MR CHEST WITH AND WITHOUT CONTRAST
TECHNIQUE: Multiplanar, multisequence MR imaging of the chest centered about
the left clavicle was performed before and after the administration
of intravenous contrast.
CONTRAST:  16 mL MULTIHANCE GADOBENATE DIMEGLUMINE 529 MG/ML IV SOLN

[Series 3: T1 · coronal · 4.0mm · 0.68mm/px · 2 of 24 slices shown (1 of 3)]
[im 1/24]
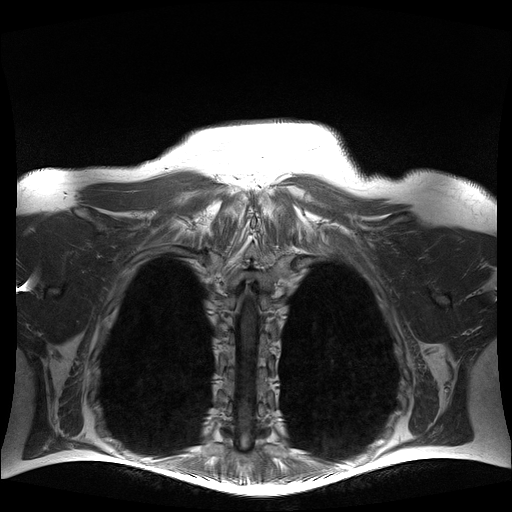
[im 24/24]
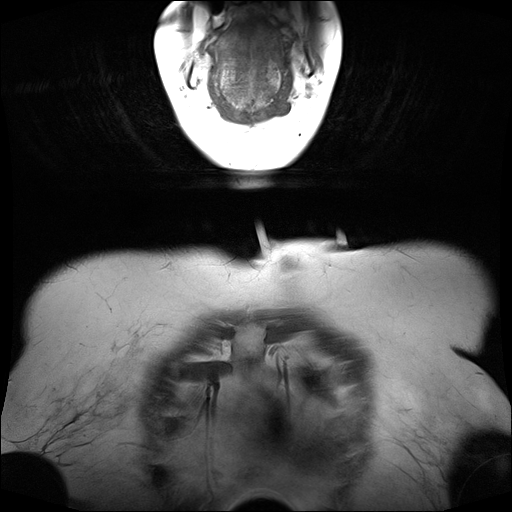

[Series 4: T1 · sagittal · 4.0mm · 0.55mm/px · 2 of 25 slices shown (2 of 3)]
[im 1/25]
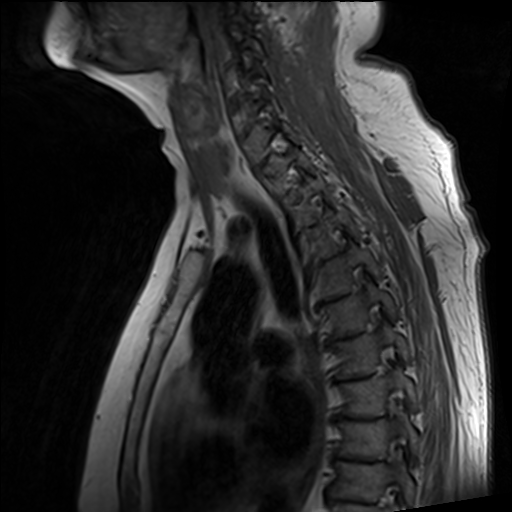
[im 25/25]
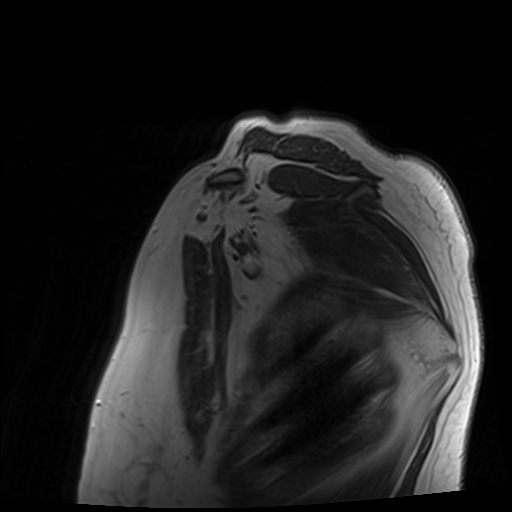

[Series 5: T1 · axial · 5.0mm · 0.47mm/px · 1 of 20 slices shown (3 of 3)]
[im 1/20]
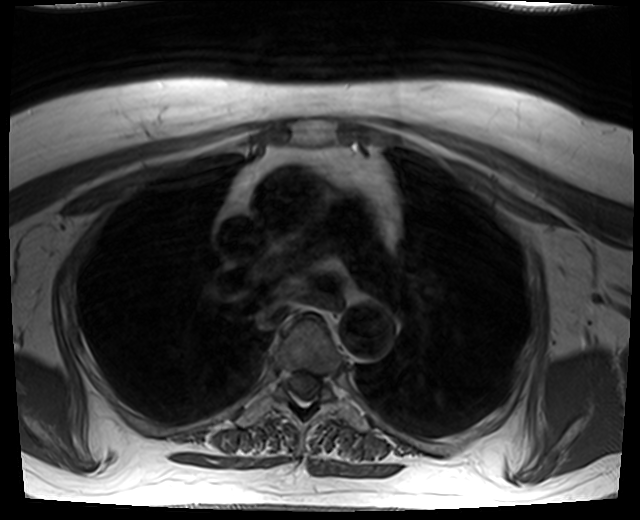

[Series 6: STIR · coronal · 4.0mm · 0.68mm/px · 2 of 24 slices shown (1 of 3)]
[im 1/24]
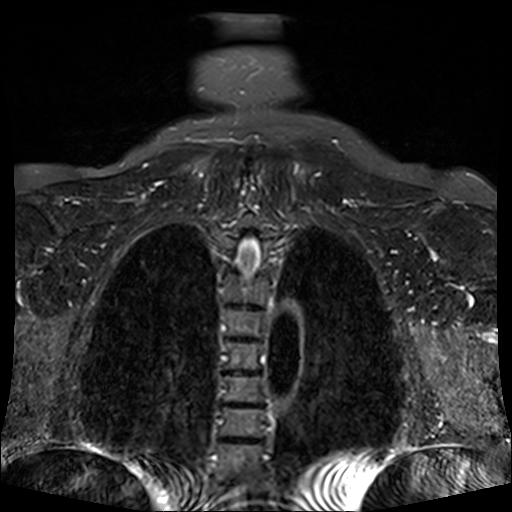
[im 24/24]
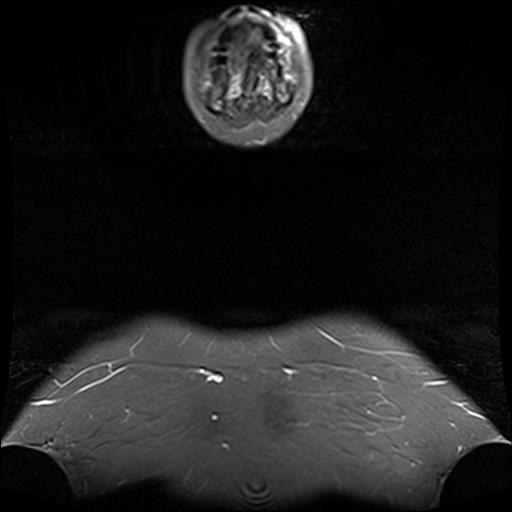

[Series 7: STIR · axial · 5.0mm · 1.17mm/px · 1 of 20 slices shown (2 of 3)]
[im 1/20]
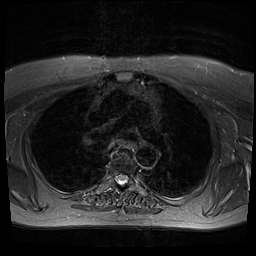

[Series 8: STIR · sagittal · 4.0mm · 0.43mm/px · 2 of 25 slices shown (3 of 3)]
[im 1/25]
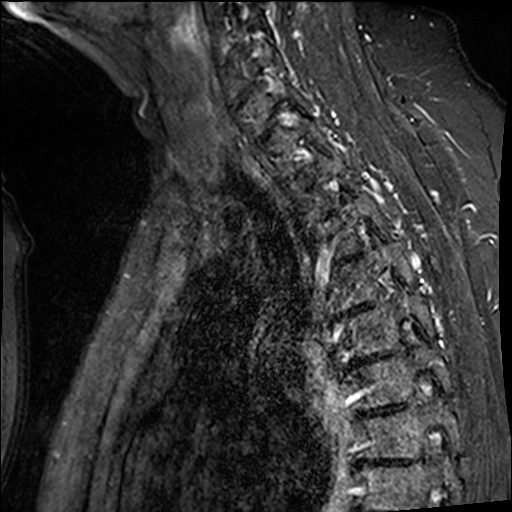
[im 25/25]
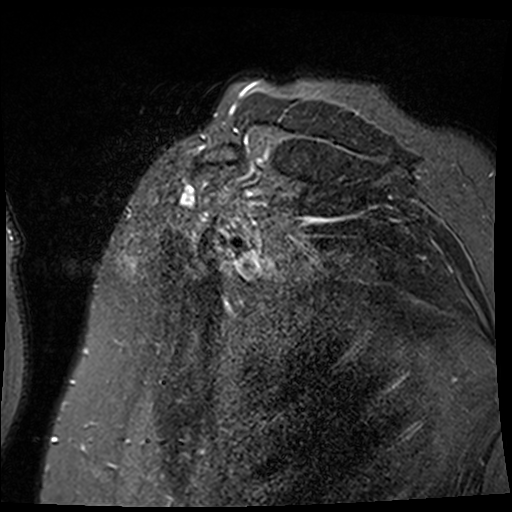

[Series 9: T1 fat-sat · axial · 5.0mm · 0.47mm/px · 1 of 20 slices shown]
[im 1/20]
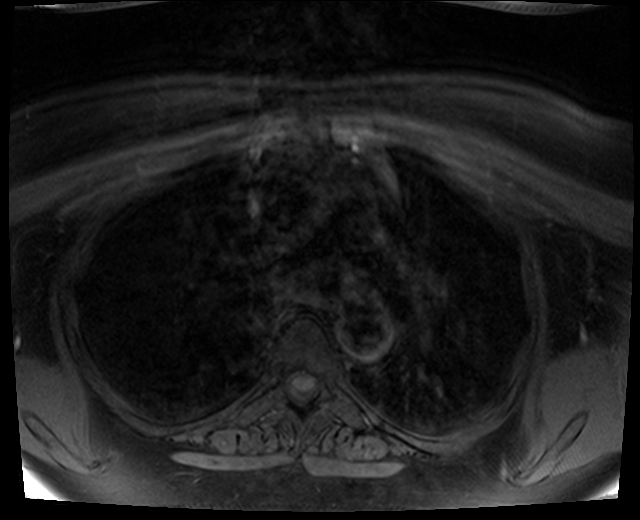

[Series 10: axial post · axial · 5.0mm · 0.47mm/px · 1 of 20 slices shown]
[im 1/20]
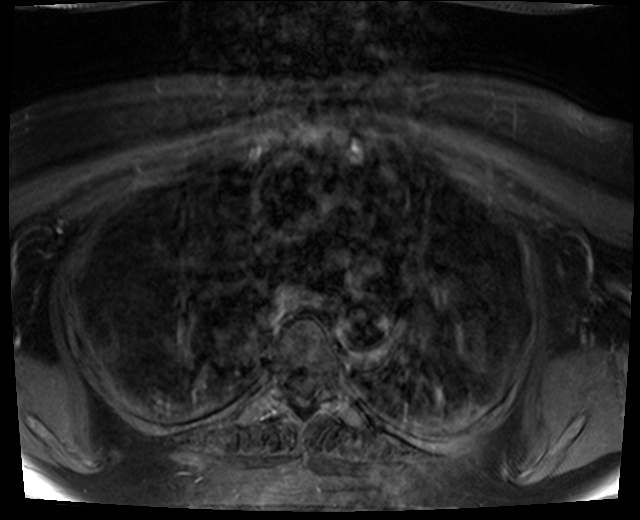

[Series 11: sag post · sagittal · 4.0mm · 0.55mm/px · 2 of 25 slices shown]
[im 1/25]
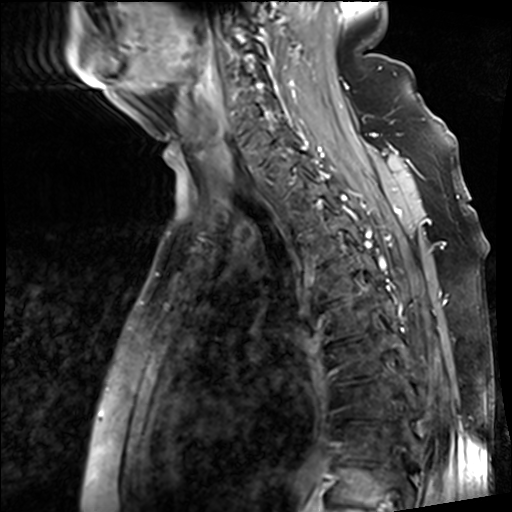
[im 25/25]
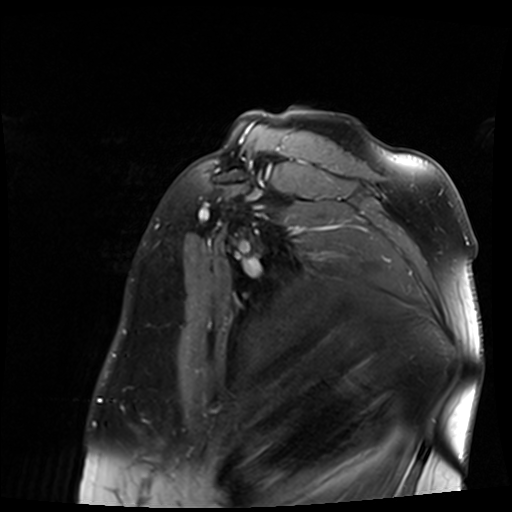

[Series 12: cor post · coronal · 4.0mm · 0.64mm/px · 2 of 24 slices shown]
[im 1/24]
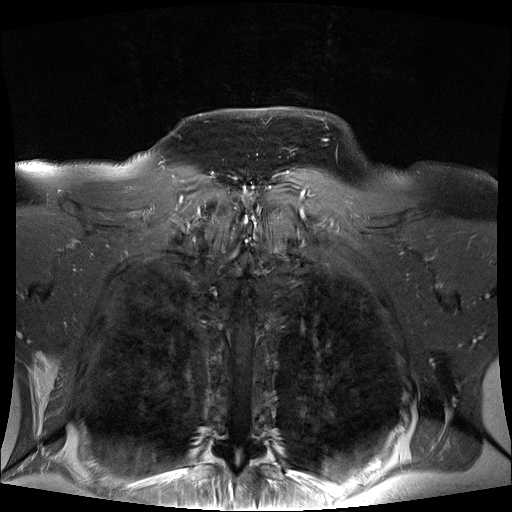
[im 24/24]
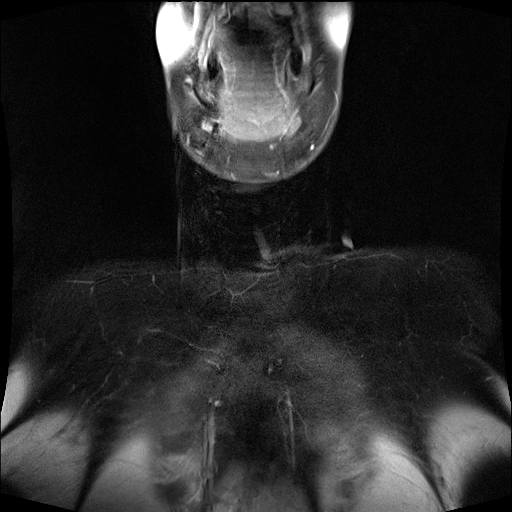

[16 of 16 positions shown; findings below may reference images not displayed]

FINDINGS: Bones/Joint/Cartilage:

Markers are placed about the region of concern. No worrisome bony
lesion is identified. The patient has sternoclavicular joint
arthropathy which is worse on the left where there is an associated
joint effusion and synovial enhancement. Fluid and thickened
synovium extending anterior to the head of the clavicle measuring
0.5 cm craniocaudal by 1.5 cm transverse by 0.6 cm AP may account
for a palpated abnormality.

Ligaments:

Negative.

Muscles and Tendons:

Intact and normal in appearance.

Soft tissue:

No fluid collection or mass is identified.
IMPRESSION: Left sternoclavicular joint arthropathy with an associated small
effusion and synovial thickening likely accounts for a palpated
abnormality. The appearance is most suggestive osteoarthritis but
may reflect [HOSPITAL] arthropathy. There is no evidence of neoplasm on
the study.

## 2020-10-10 MED ORDER — GADOBENATE DIMEGLUMINE 529 MG/ML IV SOLN
16.0000 mL | Freq: Once | INTRAVENOUS | Status: AC | PRN
  Administered 2020-10-10: 16 mL via INTRAVENOUS

## 2020-10-12 ENCOUNTER — Other Ambulatory Visit: Payer: Self-pay | Admitting: Family Medicine

## 2020-10-13 ENCOUNTER — Ambulatory Visit: Payer: 59 | Admitting: Family Medicine

## 2020-10-20 ENCOUNTER — Ambulatory Visit: Payer: 59 | Admitting: Family Medicine

## 2021-01-01 ENCOUNTER — Other Ambulatory Visit: Payer: Self-pay | Admitting: Family Medicine

## 2021-01-09 ENCOUNTER — Other Ambulatory Visit: Payer: Self-pay | Admitting: Obstetrics & Gynecology

## 2021-01-09 DIAGNOSIS — L900 Lichen sclerosus et atrophicus: Secondary | ICD-10-CM

## 2021-01-10 NOTE — Telephone Encounter (Signed)
Patient is scheduled for 03/03/21

## 2021-01-10 NOTE — Telephone Encounter (Signed)
Sch follow up appt in SEPT

## 2021-01-31 ENCOUNTER — Ambulatory Visit (INDEPENDENT_AMBULATORY_CARE_PROVIDER_SITE_OTHER): Payer: 59 | Admitting: Family Medicine

## 2021-01-31 ENCOUNTER — Encounter: Payer: Self-pay | Admitting: Family Medicine

## 2021-01-31 ENCOUNTER — Other Ambulatory Visit: Payer: Self-pay

## 2021-01-31 VITALS — BP 124/76 | HR 69 | Temp 97.9°F | Ht 64.0 in | Wt 176.5 lb

## 2021-01-31 DIAGNOSIS — M25511 Pain in right shoulder: Secondary | ICD-10-CM | POA: Diagnosis not present

## 2021-01-31 DIAGNOSIS — M25512 Pain in left shoulder: Secondary | ICD-10-CM

## 2021-01-31 NOTE — Progress Notes (Signed)
Brandi Haskin T. Raymont Andreoni, MD, [place] at [facility] [place], [address]  Phone: [phone]  FAX: [phone]  HARMONII MUSTOE - 65 y.o. female  MRN [id number]  Date of Birth: 13-Feb-1956  Date: 01/31/2021  PCP: Owens Loffler, MD  Referral: Owens Loffler, MD  Chief Complaint  Patient presents with   Shoulder Pain    Bilateral    This visit occurred during the SARS-CoV-2 public health emergency.  Safety protocols were in place, including screening questions prior to the visit, additional usage of staff PPE, and extensive cleaning of exam room while observing appropriate contact time as indicated for disinfecting solutions.   Subjective:   Brandi Mahoney is a 65 y.o. very pleasant female patient with Body mass index is 30.3 kg/m. who presents with the following:  B shoulder pain: She has been having some bilateral shoulder pain off and on for months, and initially she thought this was cardiac, but she has been worked up by cardiology and they did not feel it was cardiac at all.  She has not had any kind specific injury.  No history of distant injury, fracture, dislocation, or operative intervention.  Does not have any loss of motion.  She has not had any acute neck injury, numbness, or tingling.  Son 35 yo dx with Rectal cancer. Found with Colon cancer.  Still had her Sternoclav joint  L shoulder and thought was cardiac and cards did not think so. When at the beach.  Shoulders were hurting so much that her shoulder were hurting her.   B shoulder pain Aching with age.  Review of Systems is noted in the HPI, as appropriate   Objective:   BP 124/76   Pulse 69   Temp 97.9 F (36.6 C) (Temporal)   Ht '5\' 4"'$  (1.626 m)   Wt 176 lb 8 oz (80.1 kg)   SpO2 96%   BMI 30.30 kg/m    Shoulder: B Inspection: No muscle wasting or winging Ecchymosis/edema: neg  AC joint, scapula, clavicle: NT Cervical spine:  NT, full ROM Spurling's: neg Abduction: full, 5/5 Flexion: full, 5/5 IR, full, lift-off: 5/5 ER at neutral: full, 5/5 AC crossover and compression: neg Neer: neg Hawkins: neg Drop Test: neg Empty Can: neg Supraspinatus insertion: NT Bicipital groove: NT Speed's: neg Yergason's: neg Sulcus sign: neg Scapular dyskinesis: none C5-T1 intact Sensation intact Grip 5/5   Radiology: No results found.  Assessment and Plan:     ICD-10-CM   1. Bilateral shoulder pain, unspecified chronicity  M25.511    M25.512      Chronic shoulder pain, history and exam are pretty normal.  Exam is benign and very reassuring.  Recommended that globally she get in his good a shape as she possibly can and now nowwith Body mass index is 30.3 kg/m.  She has room to push from a weight and general fitness standpoint.  She, her husband, and her son have just joined the [employer], and I encouraged her to do as much as she can with ramping up somewhat gradually.  The pool would be ideal.   [employer] One speech-to-text software was used for transcription in this dictation.  Possible transcriptional errors can occur using Editor, commissioning.   Signed,  Maud Deed. Chelsea Nusz, MD   Outpatient Encounter Medications as of 01/31/2021  Medication Sig   AMBULATORY NON FORMULARY MEDICATION Medication Name: Nitroglycerin gel 0.125 % please apply pea sized amount to rectum three  times a day x 6-8 weeks   aspirin EC 81 MG tablet Take 81 mg by mouth daily.   clobetasol cream (TEMOVATE) 0.05 % APPLY TWO TIMES DAILY FOR TWO WEEKS THEN USE AS NEEDED FOR LICHEN SCLEROSIS   cyclobenzaprine (FLEXERIL) 10 MG tablet TAKE 1 TABLET (10 MG TOTAL) BY MOUTH 2 (TWO) TIMES DAILY AS NEEDED (PAIN).   diazepam (VALIUM) 5 MG tablet Take 1 tab po 45 minutes prior to MRI   fenofibrate 160 MG tablet TAKE ONE TABLET BY MOUTH DAILY   gabapentin (NEURONTIN) 300 MG capsule TAKE ONE CAPSULE BY MOUTH AT BEDTIME   glucose blood (ACCU-CHEK AVIVA)  test strip Use to check blood sugar once daily   Lactobacillus-Inulin (CULTURELLE ADULT ULT BALANCE PO)    levothyroxine (SYNTHROID) 75 MCG tablet TAKE ONE TABLET BY MOUTH EVERY MORNING WITH BREAKFAST   Magnesium 250 MG TABS Take 1 tablet by mouth daily.   metoprolol succinate (TOPROL-XL) 50 MG 24 hr tablet Take 1 tablet (50 mg total) by mouth daily. Take with or immediately following a meal.   Multiple Vitamins-Minerals (ONE-A-DAY WOMENS PETITES PO) Take 2 tablets by mouth daily.   nitroGLYCERIN (NITROSTAT) 0.4 MG SL tablet 1 tablet under tongue at onset of chest pain; you may repeat every 5 minutes for up to 3 doses.   pantoprazole (PROTONIX) 40 MG tablet TAKE ONE TABLET BY MOUTH DAILY   pravastatin (PRAVACHOL) 40 MG tablet TAKE ONE TABLET BY MOUTH AT BEDTIME   sertraline (ZOLOFT) 50 MG tablet TAKE ONE TABLET BY MOUTH DAILY   traZODone (DESYREL) 100 MG tablet TAKE 1-2 TABLETS AT BEDTIME AS NEEDED FOR SLEEP   No facility-administered encounter medications on file as of 01/31/2021.

## 2021-03-01 ENCOUNTER — Other Ambulatory Visit: Payer: Self-pay

## 2021-03-01 ENCOUNTER — Telehealth: Payer: Self-pay | Admitting: Family Medicine

## 2021-03-01 ENCOUNTER — Ambulatory Visit (INDEPENDENT_AMBULATORY_CARE_PROVIDER_SITE_OTHER): Payer: 59

## 2021-03-01 ENCOUNTER — Ambulatory Visit
Admission: RE | Admit: 2021-03-01 | Discharge: 2021-03-01 | Disposition: A | Discharge status: Home / Self Care | Payer: 59 | Source: Ambulatory Visit | Attending: Emergency Medicine | Admitting: Emergency Medicine

## 2021-03-01 VITALS — BP 144/79 | HR 127 | Temp 102.4°F | Resp 22

## 2021-03-01 DIAGNOSIS — U071 COVID-19: Secondary | ICD-10-CM | POA: Diagnosis not present

## 2021-03-01 DIAGNOSIS — R059 Cough, unspecified: Secondary | ICD-10-CM

## 2021-03-01 IMAGING — DX DG CHEST 2V
2 series · 2 of 2 positions shown · non-contrast
Comparison: [DATE].

CLINICAL DATA: COVID-positive, fever, cough, O2 sat 93%

EXAM:
CHEST - 2 VIEW

[chest pa]
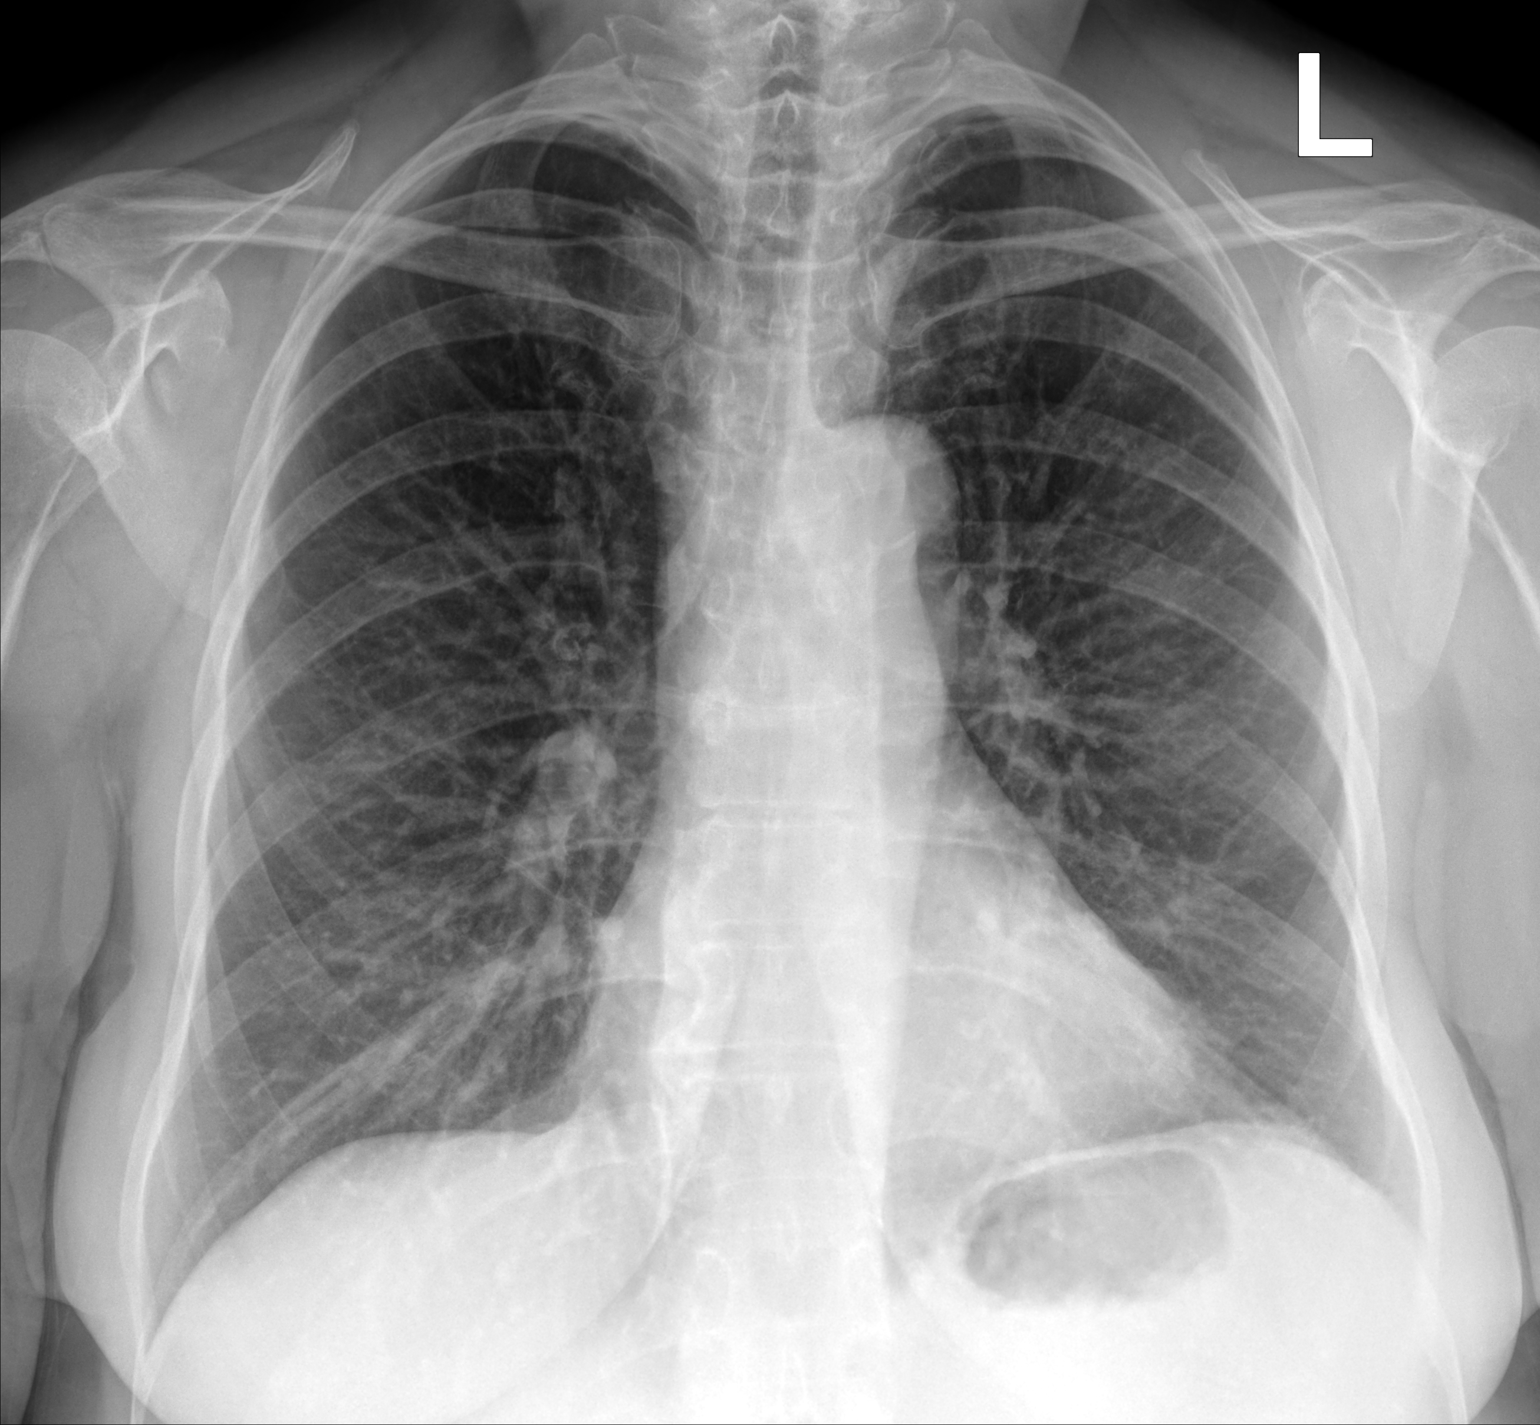

[chest lat]
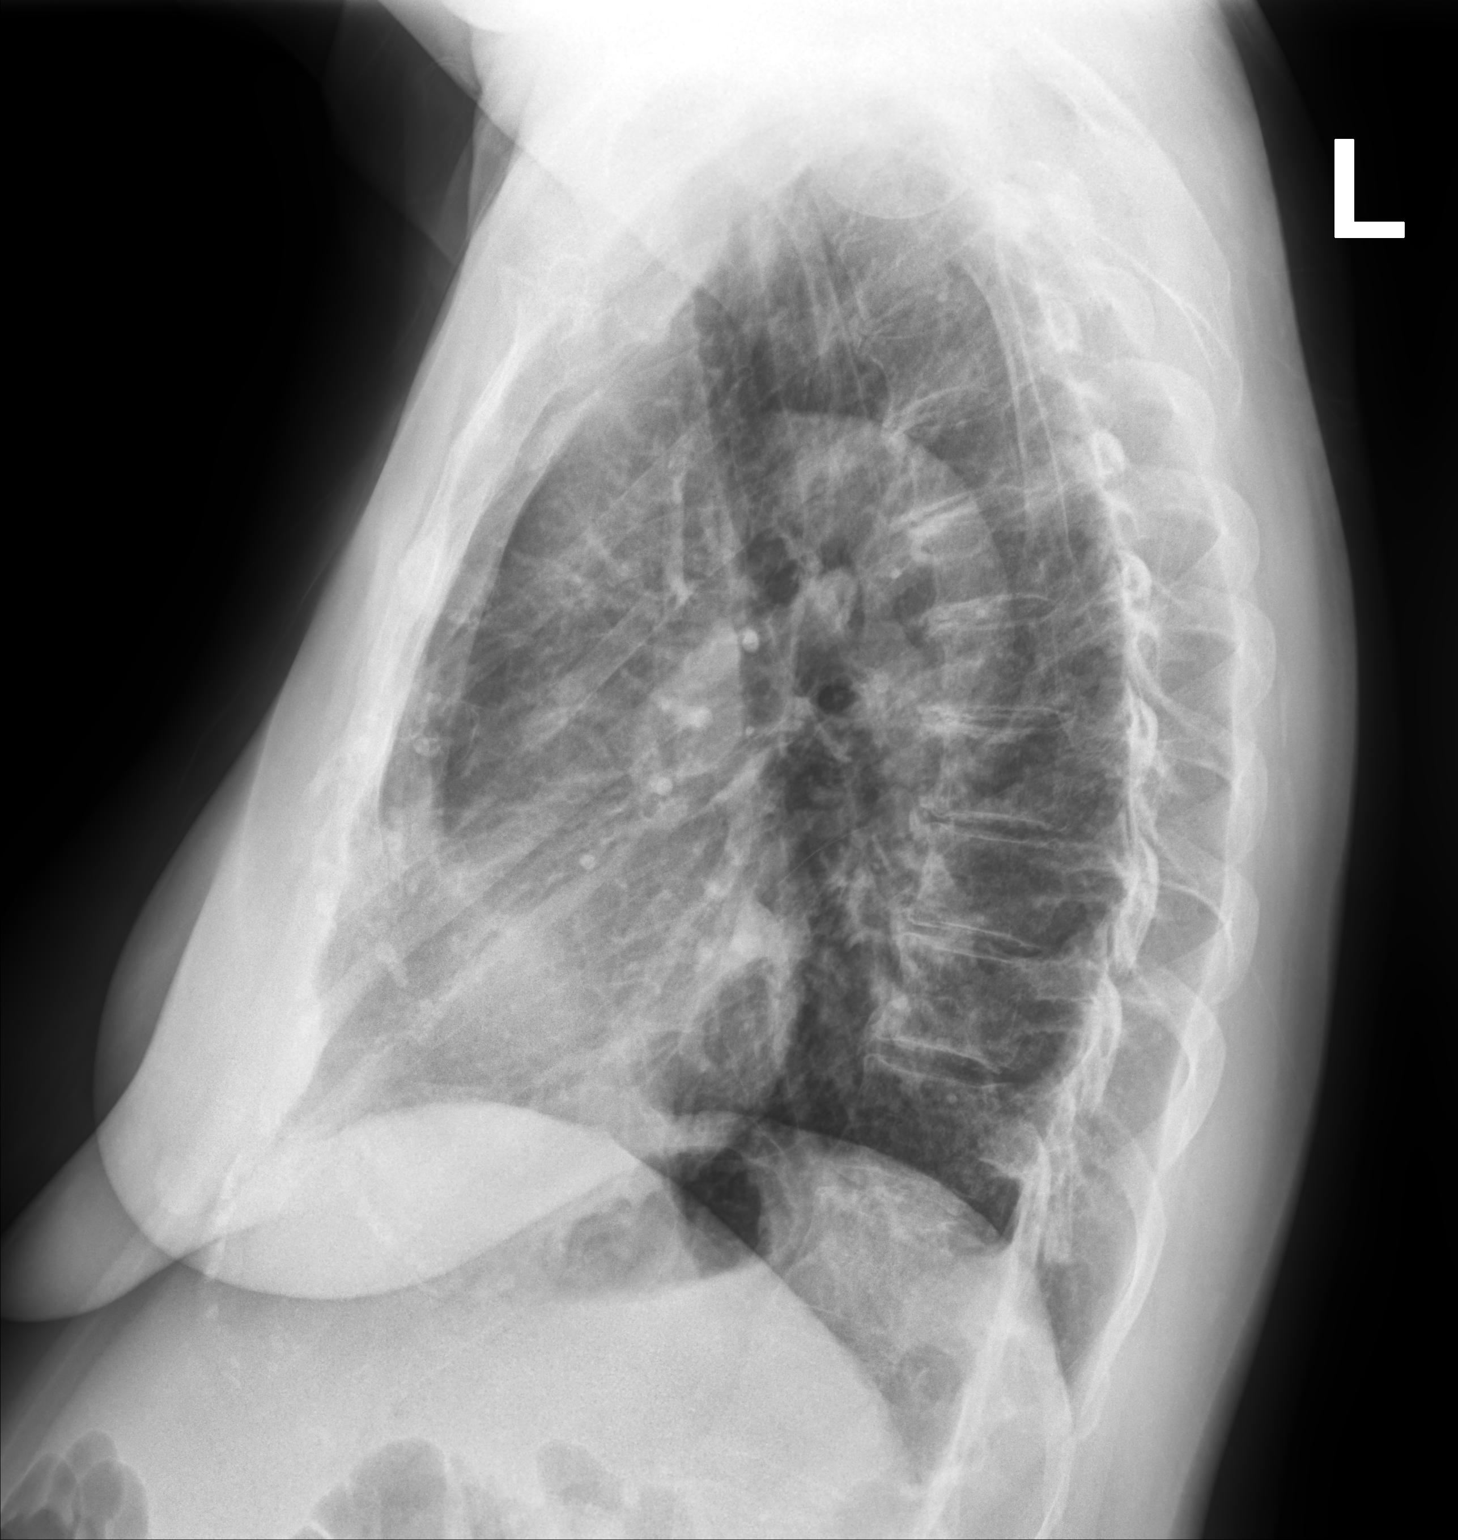

[2 of 2 positions shown; findings below may reference images not displayed]

FINDINGS: The heart size and mediastinal contours are within normal limits.
Both lungs are clear. No visible pleural effusions or pneumothorax.
No acute osseous abnormality. Left sternoclavicular joint
arthropathy better characterized on prior MRI. Polyarticular
degenerative change.
IMPRESSION: No evidence of acute cardiopulmonary disease.

## 2021-03-01 MED ORDER — ONDANSETRON 8 MG PO TBDP
ORAL_TABLET | ORAL | 0 refills | Status: DC
Start: 2021-03-01 — End: 2021-10-05

## 2021-03-01 MED ORDER — MOLNUPIRAVIR EUA 200MG CAPSULE: 4.0000 | ORAL_CAPSULE | Freq: Two times a day (BID) | ORAL | 0 refills | Status: AC

## 2021-03-01 MED ORDER — ACETAMINOPHEN 325 MG PO TABS
650.0000 mg | ORAL_TABLET | Freq: Once | ORAL | Status: AC
  Administered 2021-03-01: 650 mg via ORAL

## 2021-03-01 MED ORDER — IBUPROFEN 600 MG PO TABS: 600.0000 mg | ORAL_TABLET | Freq: Four times a day (QID) | ORAL | 0 refills | Status: DC | PRN

## 2021-03-01 MED ORDER — BENZONATATE 200 MG PO CAPS: 200.0000 mg | ORAL_CAPSULE | Freq: Three times a day (TID) | ORAL | 0 refills | Status: DC | PRN

## 2021-03-01 MED ORDER — ONDANSETRON 4 MG PO TBDP
4.0000 mg | ORAL_TABLET | Freq: Once | ORAL | Status: AC
  Administered 2021-03-01: 4 mg via ORAL

## 2021-03-01 NOTE — Telephone Encounter (Addendum)
I spoke with pt;pt tested covid + with home test on 03/01/21; pts symptoms began on 02/28/21 with fever 103 last night; pt took tylenol and now temp is 99; pt has dry cough and muscles hurt below ribs due to so much coughing. Pt having chills, body aches, H/A pain level of 7, nausea but had not vomited and no diarrhea. Pt has head congestion and scratchy S/T. Due to nausea pt is only able to take small sips of water this morning. No available appts at [facility] and I scheduled pt an appt  at [facility] in [facility] 03/01/21 at 1:30;  Self quarantine, drink plenty of fluids, rest, and take Tylenol for fever. UC & ED precautions given and pt voiced understanding. Sending note to Dr Lorelei Pont and Butch Penny CMA and will also teams Butch Penny. I will add access nurse note to this note when is in access portal.

## 2021-03-01 NOTE — Discharge Instructions (Addendum)
I will contact you if your x-ray is abnormal and requires intervention.  Zofran for nausea, 600 mg of ibuprofen combined with 1000 mg of Tylenol 3-4 times a day as needed for body aches, headaches, fevers, Tessalon for the cough, Flonase, saline nasal irrigation the nasal congestion, push electrolyte containing fluids such as Pedialyte or Gatorade.  monitor your oxygen saturation.  get out and walk every day to prevent PE and to keep your lungs open.

## 2021-03-01 NOTE — ED Triage Notes (Signed)
Patient started feeling bad yesterday, home covid test this morning was positive.  Son has covid, diagnosed over the weekend.  Patient has a cough, headache, vomiting, chills, fever

## 2021-03-01 NOTE — ED Provider Notes (Signed)
HPI  SUBJECTIVE:  Brandi Mahoney is a 65 y.o. female who presents with fevers T-max 103, headaches, body aches, nasal congestion, rhinorrhea, sinus pain and pressure, scratchy sore throat cough, nausea, vomiting starting yesterday.  She reports abdominal soreness secondary to coughing.  She states that she is unable to tolerate any p.o.  No loss of sense of smell or taste, wheezing, shortness of breath, diarrhea.  She states that she had a positive home COVID test.  Her son also has COVID.  She got two COVID boosters.  She has not tried anything because she cannot keep anything down.  There are no aggravating relieving factors.  She has a past medical history of asthma, hypertension, prediabetes and coronary artery disease.  [id number], Retia Passe., MD    Past Medical History:  Diagnosis Date   CAD (coronary artery disease)    a. 01/2010 Cath: mild atherosclerosis in the mid LCX after takeoff of OM1, mild diff LAD dzs;  b. 07/2013 MV: EF 67%, no ischemia.   GAD (generalized anxiety disorder)    History of kidney stones    HLD (hyperlipidemia)    Hypothyroidism    Major depressive disorder, recurrent episode, in full remission ([place]) 09/08/2008   Qualifier: Diagnosis of  By: Lorelei Pont MD, [place]     Migraine headache    Myocarditis ([place])    a. 01/2010 presented w/ c/p and elev trop-->Cath: mild atherosclerosis in the mid LCX after takeoff of OM1, mild diff LAD dzs;  b. 03/2010 Echo: EF 60-65%, DD, mild LVH;  c. 07/2011 Echo: EF 55-60%, mild LVH, no rwma.   Sleep apnea    has c-pap.  wears off and on - not lately  using  per reported 07-29-2020   Type 2 diabetes, diet controlled [facility])     Past Surgical History:  Procedure Laterality Date   APPENDECTOMY  1974   BREAST SURGERY Right 1988   fatty tumore from breast   CARDIAC CATHETERIZATION  8/11   no significant -[facility]- Dr Rockey Situ,   CHOLECYSTECTOMY  2002   COLONOSCOPY  2017   CYST EXCISION     ovarian cyst 1994   NEPHROLITHOTOMY Right  08/20/2014   Procedure: RIGHT PERCUTANEOUS NEPHROLITHOTOMY ;  Surgeon: Malka So, MD;  Location: [facility];  Service: Urology;  Laterality: Right;   POLYPECTOMY     RECTAL EXAM UNDER ANESTHESIA N/A 08/02/2020   Procedure: Darci Needle EXAM UNDER ANESTHESIA INJECTION OF PERIANAL BOTOX;  Surgeon: Ileana Roup, MD;  Location: [facility];  Service: General;  Laterality: N/A;   TOTAL ABDOMINAL HYSTERECTOMY W/ BILATERAL SALPINGOOPHORECTOMY  2000   no ovaries, uterus, or cervix.    Family History  Problem Relation Age of Onset   Coronary artery disease Mother    Diabetes Mother    ALS Mother    Hyperlipidemia Father    Breast cancer Paternal Grandmother    Lung cancer Paternal Grandfather        CAD   Other Daughter        gluten   Down syndrome Son    Colon cancer Maternal Grandfather        dx early 51's    Breast cancer Sister 40   Esophageal cancer Neg Hx    Liver cancer Neg Hx    Pancreatic cancer Neg Hx    Rectal cancer Neg Hx    Stomach cancer Neg Hx     Social History   Tobacco Use   Smoking status: Former  Packs/day: 0.35    Years: 35.00    Pack years: 12.25    Types: Cigarettes    Quit date: 09/25/2008    Years since quitting: 12.4   Smokeless tobacco: Never   Tobacco comments:    quit in 2007-2008, social smoker  Vaping Use   Vaping Use: Never used  Substance Use Topics   Alcohol use: Not Currently   Drug use: No    No current facility-administered medications for this encounter.  Current Outpatient Medications:    benzonatate (TESSALON) 200 MG capsule, Take 1 capsule (200 mg total) by mouth 3 (three) times daily as needed for cough., Disp: 30 capsule, Rfl: 0   ibuprofen (ADVIL) 600 MG tablet, Take 1 tablet (600 mg total) by mouth every 6 (six) hours as needed., Disp: 30 tablet, Rfl: 0   molnupiravir EUA (LAGEVRIO) 200 mg CAPS capsule, Take 4 capsules (800 mg total) by mouth 2 (two) times daily for 5 days., Disp: 40 capsule, Rfl: 0    ondansetron (ZOFRAN ODT) 8 MG disintegrating tablet, 1/2- 1 tablet q 8 hr prn nausea, vomiting, Disp: 20 tablet, Rfl: 0   AMBULATORY NON FORMULARY MEDICATION, Medication Name: Nitroglycerin gel 0.125 % please apply pea sized amount to rectum three times a day x 6-8 weeks, Disp: 30 g, Rfl: 1   aspirin EC 81 MG tablet, Take 81 mg by mouth daily., Disp: , Rfl:    clobetasol cream (TEMOVATE) 0.05 %, APPLY TWO TIMES DAILY FOR TWO WEEKS THEN USE AS NEEDED FOR LICHEN SCLEROSIS, Disp: 45 g, Rfl: 3   cyclobenzaprine (FLEXERIL) 10 MG tablet, TAKE 1 TABLET (10 MG TOTAL) BY MOUTH 2 (TWO) TIMES DAILY AS NEEDED (PAIN)., Disp: , Rfl:    diazepam (VALIUM) 5 MG tablet, Take 1 tab po 45 minutes prior to MRI, Disp: 1 tablet, Rfl: 0   fenofibrate 160 MG tablet, TAKE ONE TABLET BY MOUTH DAILY, Disp: 30 tablet, Rfl: 11   gabapentin (NEURONTIN) 300 MG capsule, TAKE ONE CAPSULE BY MOUTH AT BEDTIME, Disp: 90 capsule, Rfl: 3   glucose blood (ACCU-CHEK AVIVA) test strip, Use to check blood sugar once daily, Disp: 100 each, Rfl: 3   Lactobacillus-Inulin (CULTURELLE ADULT ULT BALANCE PO), , Disp: , Rfl:    levothyroxine (SYNTHROID) 75 MCG tablet, TAKE ONE TABLET BY MOUTH EVERY MORNING WITH BREAKFAST, Disp: 30 tablet, Rfl: 11   Magnesium 250 MG TABS, Take 1 tablet by mouth daily., Disp: , Rfl:    metoprolol succinate (TOPROL-XL) 50 MG 24 hr tablet, Take 1 tablet (50 mg total) by mouth daily. Take with or immediately following a meal., Disp: 90 tablet, Rfl: 3   Multiple Vitamins-Minerals (ONE-A-DAY WOMENS PETITES PO), Take 2 tablets by mouth daily., Disp: , Rfl:    nitroGLYCERIN (NITROSTAT) 0.4 MG SL tablet, 1 tablet under tongue at onset of chest pain; you may repeat every 5 minutes for up to 3 doses., Disp: 25 tablet, Rfl: 3   pantoprazole (PROTONIX) 40 MG tablet, TAKE ONE TABLET BY MOUTH DAILY, Disp: 90 tablet, Rfl: 0   pravastatin (PRAVACHOL) 40 MG tablet, TAKE ONE TABLET BY MOUTH AT BEDTIME, Disp: 90 tablet, Rfl: 3    sertraline (ZOLOFT) 50 MG tablet, TAKE ONE TABLET BY MOUTH DAILY, Disp: 90 tablet, Rfl: 1   traZODone (DESYREL) 100 MG tablet, TAKE 1-2 TABLETS AT BEDTIME AS NEEDED FOR SLEEP, Disp: 60 tablet, Rfl: 2  Allergies  Allergen Reactions   Pseudoeph-Doxylamine-Dm-Apap Anaphylaxis    Allergy to nyquil   Gluten Other (See  Comments)    Gi upset   Penicillins Itching and Rash     ROS  As noted in HPI.   Physical Exam  BP (!) 144/79 (BP Location: Left Arm)   Pulse (!) 127   Temp (!) 102.4 F (39.1 C) (Oral)   Resp (!) 22   SpO2 93%   Constitutional: Well developed, well nourished, appears ill.  No respiratory distress. Eyes:  EOMI, conjunctiva normal bilaterally HENT: Normocephalic, atraumatic,mucus membranes moist.  Positive nasal congestion. Neck: No cervical lymphadenopathy, meningismus Respiratory: Normal inspiratory effort, fair air movement, lungs clear, no anterior or lateral chest wall tenderness Cardiovascular: Regular tachycardia, no murmurs rubs or gallop GI: nondistended soft, active bowel sounds.  Mild upper abdominal soreness.  No rebound, guarding. skin: No rash, skin intact Musculoskeletal: no deformities Neurologic: Alert & oriented x 3, no focal neuro deficits Psychiatric: Speech and behavior appropriate   ED Course   Medications  acetaminophen (TYLENOL) tablet 650 mg (650 mg Oral Given 03/01/21 1407)  ondansetron (ZOFRAN-ODT) disintegrating tablet 4 mg (4 mg Oral Given 03/01/21 1343)    Orders Placed This Encounter  Procedures   DG Chest 2 View    Standing Status:   Standing    Number of Occurrences:   1    Order Specific Question:   Reason for Exam (SYMPTOM  OR DIAGNOSIS REQUIRED)    Answer:   COVID-positive, fever, cough, O2 sat 93%    No results found for this or any previous visit (from the past 24 hour(s)). DG Chest 2 View  Result Date: 03/01/2021 CLINICAL DATA:  COVID-positive, fever, cough, O2 sat 93% EXAM: CHEST - 2 VIEW COMPARISON:  Nov 02, 2018. FINDINGS: The heart size and mediastinal contours are within normal limits. Both lungs are clear. No visible pleural effusions or pneumothorax. No acute osseous abnormality. Left sternoclavicular joint arthropathy better characterized on prior MRI. Polyarticular degenerative change. IMPRESSION: No evidence of acute cardiopulmonary disease. Electronically Signed   By: Margaretha Sheffield M.D.   On: 03/01/2021 14:53    ED Clinical Impression  1. COVID-19 virus infection      ED Assessment/Plan  Patient with COVID.  She was given Zofran and Tylenol here.  We will check a chest x-ray because of the oxygen saturation at 93%.  Discussed antiviral options and she has opted for Molnupiravir.  We will also send home with Zofran, Tylenol/ibuprofen, Tessalon, Flonase, saline nasal irrigation, push electrolyte containing fluids.  She has a pulse ox at home and will monitor her oxygen saturation.  Advised her to get out and walk every day to prevent PE and to keep her lungs open.  Strict ER return precautions given.  Reviewed imaging independently.  No pneumonia.  See radiology report for full details.  Discussed labs, imaging, MDM, treatment plan, and plan for follow-up with patient. Discussed sn/sx that should prompt return to the ED. patient agrees with plan.   Meds ordered this encounter  Medications   acetaminophen (TYLENOL) tablet 650 mg   ondansetron (ZOFRAN-ODT) disintegrating tablet 4 mg   molnupiravir EUA (LAGEVRIO) 200 mg CAPS capsule    Sig: Take 4 capsules (800 mg total) by mouth 2 (two) times daily for 5 days.    Dispense:  40 capsule    Refill:  0   ibuprofen (ADVIL) 600 MG tablet    Sig: Take 1 tablet (600 mg total) by mouth every 6 (six) hours as needed.    Dispense:  30 tablet    Refill:  0  benzonatate (TESSALON) 200 MG capsule    Sig: Take 1 capsule (200 mg total) by mouth 3 (three) times daily as needed for cough.    Dispense:  30 capsule    Refill:  0   ondansetron  (ZOFRAN ODT) 8 MG disintegrating tablet    Sig: 1/2- 1 tablet q 8 hr prn nausea, vomiting    Dispense:  20 tablet    Refill:  0      *This clinic note was created using [employer]. Therefore, there may be occasional mistakes despite careful proofreading.  ?    Melynda Ripple, MD 03/01/21 1652

## 2021-03-01 NOTE — Telephone Encounter (Signed)
Patient called in tested positive Covid .would like medication for symptoms . Cough , fever , chills,headache ,nausea  [phone]

## 2021-03-02 ENCOUNTER — Telehealth: Payer: Self-pay | Admitting: Family Medicine

## 2021-03-02 MED ORDER — GUAIFENESIN-CODEINE 100-10 MG/5ML PO SOLN: 5.0000 mL | Freq: Four times a day (QID) | ORAL | 0 refills | Status: DC | PRN

## 2021-03-02 NOTE — Addendum Note (Signed)
Addended by: Owens Loffler on: 03/02/2021 01:33 PM   Modules accepted: Orders

## 2021-03-02 NOTE — Telephone Encounter (Signed)
Patient called in requesting a stronger cough medication be call in . Please advise

## 2021-03-02 NOTE — Telephone Encounter (Signed)
Mrs. Imperato notified as instructed by telephone.

## 2021-03-02 NOTE — Telephone Encounter (Signed)
I sent her in some cheratussin.  It has codeine in it, so do not drive while taking it.  I would use it before bed only.

## 2021-03-03 ENCOUNTER — Ambulatory Visit: Payer: Self-pay | Admitting: Obstetrics & Gynecology

## 2021-03-03 ENCOUNTER — Ambulatory Visit: Payer: 59 | Admitting: Obstetrics & Gynecology

## 2021-03-08 ENCOUNTER — Ambulatory Visit: Payer: Self-pay | Admitting: Obstetrics & Gynecology

## 2021-03-16 ENCOUNTER — Encounter: Payer: Self-pay | Admitting: Family Medicine

## 2021-03-16 ENCOUNTER — Other Ambulatory Visit: Payer: Self-pay

## 2021-03-16 ENCOUNTER — Telehealth (INDEPENDENT_AMBULATORY_CARE_PROVIDER_SITE_OTHER): Payer: 59 | Admitting: Family Medicine

## 2021-03-16 VITALS — Ht 64.0 in

## 2021-03-16 DIAGNOSIS — U071 COVID-19: Secondary | ICD-10-CM

## 2021-03-16 NOTE — Progress Notes (Signed)
      Brandi Pennix T. Brandi Doring, MD Primary Care and [place] at [facility], [address] Phone: [phone]  FAX: [phone]  Brandi Mahoney - 65 y.o. female  MRN [id number]  Date of Birth: 1956/04/08  Visit Date: 03/16/2021  PCP: Owens Loffler, MD  Referred by: Owens Loffler, MD  Virtual Visit via Video Note:  I connected with  Brandi Mahoney on 03/16/2021  2:40 PM EDT by a video enabled telemedicine application and verified that I am speaking with the correct person using two identifiers.   Location patient: home computer, tablet, or smartphone Location provider: work or home office Consent: Verbal consent directly obtained from [name] Persons participating in the virtual visit: patient, provider  I discussed the limitations of evaluation and management by telemedicine and the availability of in person appointments. The patient expressed understanding and agreed to proceed.  Chief Complaint  Patient presents with   Follow-up    Covid-still testing positive-Symptoms have resolved.  Want to make sure it is okay to visit her son Brandi Mahoney who is going through chemo    History of Present Illness:  03/01/2021: dx Covid  2 weeks ago, Friday and Saturday   Review of Systems as above: See pertinent positives and pertinent negatives per HPI No acute distress verbally   Observations/Objective/Exam:  An attempt was made to discern vital signs over the phone and per patient if applicable and possible.   General:    Alert, Oriented, appears well and in no acute distress  Pulmonary:     On inspection no signs of respiratory distress.  Psych / Neurological:     Pleasant and cooperative.  Assessment and Plan:    ICD-10-CM   1. COVID-19  U07.1      Total encounter time: 10 minutes. This includes total time spent on the day of encounter.  She and her husband are going to see her son who has  chemotherapy for colon cancer.  She has been greater than 14 days past her COVID diagnosis and she is currently without any symptoms.  She did check a COVID-19 test at home, and she, her husband, and her son are all positive.  They are all asymptomatic at this point.  Do not think they need to limit her visit, but since her son it is immunocompromised I do think that wearing a mask is reasonable, particularly for her daughter who is coming out of town to visit.  I discussed the assessment and treatment plan with the patient. The patient was provided an opportunity to ask questions and all were answered. The patient agreed with the plan and demonstrated an understanding of the instructions.   The patient was advised to call back or seek an in-person evaluation if the symptoms worsen or if the condition fails to improve as anticipated.  Follow-up: prn unless noted otherwise below No follow-ups on file.  No orders of the defined types were placed in this encounter.  No orders of the defined types were placed in this encounter.   Signed,  Maud Deed. Sultana Tierney, MD

## 2021-03-31 NOTE — Telephone Encounter (Signed)
Open in error

## 2021-04-13 ENCOUNTER — Other Ambulatory Visit: Payer: Self-pay | Admitting: Family Medicine

## 2021-04-14 NOTE — Telephone Encounter (Signed)
Lvm for pt to call office to schedule a cpe/lab also I sent a my chart letter

## 2021-04-14 NOTE — Telephone Encounter (Signed)
Please schedule CPE with Dr. Lorelei Pont.

## 2021-04-15 NOTE — Telephone Encounter (Signed)
2nd attempt  to schedule pt lab/cpe

## 2021-04-19 ENCOUNTER — Other Ambulatory Visit: Payer: Self-pay | Admitting: Family Medicine

## 2021-04-20 MED ORDER — LEVOTHYROXINE SODIUM 75 MCG PO TABS: ORAL_TABLET | ORAL | 5 refills | Status: DC

## 2021-04-20 NOTE — Addendum Note (Signed)
Addended by: Carter Kitten on: 04/20/2021 09:05 AM   Modules accepted: Orders

## 2021-05-18 ENCOUNTER — Other Ambulatory Visit: Payer: Self-pay | Admitting: Family Medicine

## 2021-07-07 ENCOUNTER — Telehealth: Payer: Self-pay | Admitting: Family Medicine

## 2021-07-07 MED ORDER — FENOFIBRATE 160 MG PO TABS: 160.0000 mg | ORAL_TABLET | Freq: Every day | ORAL | 2 refills | Status: DC

## 2021-07-07 NOTE — Telephone Encounter (Signed)
Please schedule CPE with fasting labs prior with Dr. Copland.  

## 2021-07-07 NOTE — Telephone Encounter (Signed)
°  Encourage patient to contact the pharmacy for refills or they can request refills through [place]:  Please schedule appointment if longer than 1 year  NEXT APPOINTMENT DATE:NO FUTURE APPT  MEDICATION:fenofibrate 160 MG tablet  Is the patient out of medication? YES  PHARMACY:[facility] [id number] [address], [place]  Let patient know to contact pharmacy at the end of the day to make sure medication is ready.  Please notify patient to allow 48-72 hours to process  CLINICAL FILLS OUT ALL BELOW:   LAST REFILL:  QTY:  REFILL DATE:    OTHER COMMENTS:    Okay for refill?  Please advise

## 2021-07-21 ENCOUNTER — Other Ambulatory Visit: Payer: Self-pay | Admitting: Family Medicine

## 2021-07-21 NOTE — Telephone Encounter (Signed)
Please schedule CPE with fasting labs prior with Dr. Copland.  

## 2021-07-22 NOTE — Telephone Encounter (Signed)
Called pt to get scheduled CPE. Lvmtcb

## 2021-07-26 DIAGNOSIS — M62830 Muscle spasm of back: Secondary | ICD-10-CM | POA: Diagnosis not present

## 2021-07-26 DIAGNOSIS — M9902 Segmental and somatic dysfunction of thoracic region: Secondary | ICD-10-CM | POA: Diagnosis not present

## 2021-07-26 DIAGNOSIS — M62412 Contracture of muscle, left shoulder: Secondary | ICD-10-CM | POA: Diagnosis not present

## 2021-07-26 DIAGNOSIS — R293 Abnormal posture: Secondary | ICD-10-CM | POA: Diagnosis not present

## 2021-07-26 DIAGNOSIS — M9901 Segmental and somatic dysfunction of cervical region: Secondary | ICD-10-CM | POA: Diagnosis not present

## 2021-07-27 DIAGNOSIS — M9901 Segmental and somatic dysfunction of cervical region: Secondary | ICD-10-CM | POA: Diagnosis not present

## 2021-07-27 DIAGNOSIS — R293 Abnormal posture: Secondary | ICD-10-CM | POA: Diagnosis not present

## 2021-07-27 DIAGNOSIS — M62830 Muscle spasm of back: Secondary | ICD-10-CM | POA: Diagnosis not present

## 2021-07-27 DIAGNOSIS — M9902 Segmental and somatic dysfunction of thoracic region: Secondary | ICD-10-CM | POA: Diagnosis not present

## 2021-07-27 DIAGNOSIS — M62412 Contracture of muscle, left shoulder: Secondary | ICD-10-CM | POA: Diagnosis not present

## 2021-07-28 DIAGNOSIS — M62412 Contracture of muscle, left shoulder: Secondary | ICD-10-CM | POA: Diagnosis not present

## 2021-07-28 DIAGNOSIS — M9902 Segmental and somatic dysfunction of thoracic region: Secondary | ICD-10-CM | POA: Diagnosis not present

## 2021-07-28 DIAGNOSIS — R293 Abnormal posture: Secondary | ICD-10-CM | POA: Diagnosis not present

## 2021-07-28 DIAGNOSIS — M9901 Segmental and somatic dysfunction of cervical region: Secondary | ICD-10-CM | POA: Diagnosis not present

## 2021-07-28 DIAGNOSIS — M62830 Muscle spasm of back: Secondary | ICD-10-CM | POA: Diagnosis not present

## 2021-07-30 ENCOUNTER — Emergency Department
Admission: EM | Admit: 2021-07-30 | Discharge: 2021-07-30 | Disposition: A | Discharge status: Home / Self Care | Payer: Medicare Other | Attending: Emergency Medicine | Admitting: Emergency Medicine

## 2021-07-30 ENCOUNTER — Other Ambulatory Visit: Payer: Self-pay

## 2021-07-30 DIAGNOSIS — L900 Lichen sclerosus et atrophicus: Secondary | ICD-10-CM | POA: Diagnosis not present

## 2021-07-30 DIAGNOSIS — R102 Pelvic and perineal pain: Secondary | ICD-10-CM | POA: Diagnosis present

## 2021-07-30 MED ORDER — OXYCODONE HCL 5 MG PO TABS: 5.0000 mg | ORAL_TABLET | ORAL | 0 refills | Status: DC | PRN

## 2021-07-30 MED ORDER — METHYLPREDNISOLONE 4 MG PO TBPK: ORAL_TABLET | ORAL | 0 refills | Status: DC

## 2021-07-30 MED ORDER — BETAMETHASONE DIPROPIONATE 0.05 % EX OINT: TOPICAL_OINTMENT | Freq: Two times a day (BID) | CUTANEOUS | 0 refills | Status: DC

## 2021-07-30 NOTE — ED Triage Notes (Signed)
Patient c/o vaginal pain/inflammation. Patient has known lichen sclerosis and is on medication; however reports she is currently having an outbreak.

## 2021-07-30 NOTE — ED Provider Notes (Signed)
[facility] Provider Note    Event Date/Time   First MD Initiated Contact with Patient 07/30/21 1739     (approximate)   History   Vaginal Pain   HPI  Brandi Mahoney is a 66 y.o. female with history of lichen sclerosis presents with increased vaginal pain.  States she started using her clobetasol but the area has worsened.  She is concerned as they are headed out of town to see her son who has stage III colon cancer.  Surgery is scheduled for Valentine's Day.  States they will need a ride to [place].  States very difficult to sit at this time.  Denies any fever or chills.  No vaginal discharge.  No UTI symptoms but does have pain due to the lichen sclerosis with urination.  Symptoms started yesterday      Physical Exam   Triage Vital Signs: ED Triage Vitals  Enc Vitals Group     BP 07/30/21 1652 (!) 149/82     Pulse Rate 07/30/21 1652 90     Resp 07/30/21 1652 18     Temp 07/30/21 1652 98.5 F (36.9 C)     Temp Source 07/30/21 1652 Oral     SpO2 07/30/21 1652 94 %     Weight 07/30/21 1710 170 lb (77.1 kg)     Height --      Head Circumference --      Peak Flow --      Pain Score 07/30/21 1710 10     Pain Loc --      Pain Edu? --      Excl. in [place]? --     Most recent vital signs: Vitals:   07/30/21 1652  BP: (!) 149/82  Pulse: 90  Resp: 18  Temp: 98.5 F (36.9 C)  SpO2: 94%     General: Awake, no distress.   CV:  Good peripheral perfusion. regular rate and  rhythm Resp:  Normal effort.  Abd:  No distention.   Other:  Vaginal exam shows irritation and ulceration on the labia minora, there is 1 area that has a scab from bleeding, area appears very irritated   ED Results / Procedures / Treatments   Labs (all labs ordered are listed, but only abnormal results are displayed) Labs Reviewed - No data to display   EKG     RADIOLOGY     PROCEDURES:   Procedures   MEDICATIONS ORDERED IN ED: Medications - No  data to display   IMPRESSION / MDM / [place] / ED COURSE  I reviewed the triage vital signs and the nursing notes.                              The patient presents with vaginal pain.  Differential includes genital herpes, worsening lichen sclerosis, UTI, shingles  Physical exam does show worsening lichen sclerosis.  There are no herpetic lesions or vaginal discharge noted.  I did discuss findings with the patient.  We will do a topical betamethasone cream.  Also started oral steroids and pain medication.  Due to the burning on the area with urination I did encourage the patient to apply a barrier cream after applying the betamethasone.  Told her this may actually help prevent the stinging in the area.  The patient agrees with treatment plan.  She was given a prescription for oxycodone and discharged stable condition.  FINAL CLINICAL IMPRESSION(S) / ED DIAGNOSES   Final diagnoses:  Lichen sclerosus     Rx / DC Orders   ED Discharge Orders          Ordered    betamethasone dipropionate (DIPROLENE) 0.05 % ointment  2 times daily        07/30/21 1819    methylPREDNISolone (MEDROL DOSEPAK) 4 MG TBPK tablet        07/30/21 1819    oxyCODONE (OXY IR/ROXICODONE) 5 MG immediate release tablet  Every 4 hours PRN        07/30/21 1819             Note:  This document was prepared using Dragon voice recognition software and may include unintentional dictation errors.    Versie Starks, PA-C 07/30/21 Carlynn Purl, MD 07/30/21 [phone]

## 2021-08-01 ENCOUNTER — Other Ambulatory Visit: Payer: Self-pay | Admitting: Obstetrics & Gynecology

## 2021-08-01 ENCOUNTER — Telehealth: Payer: Self-pay

## 2021-08-01 MED ORDER — LIDOCAINE HCL URETHRAL/MUCOSAL 2 % EX GEL: 1.0000 "application " | CUTANEOUS | 2 refills | Status: DC | PRN

## 2021-08-01 NOTE — Telephone Encounter (Signed)
1. Need to see patient, could be something other than Lichen Sclerosus, especially the way the ER doc described it from 07/30/21.  However, I would need to see before testing or treating alternatives.  Haven't seen since 2021. 2.  Since she is out of town w son, will prescribes something to try to help until she can be seen

## 2021-08-01 NOTE — Telephone Encounter (Signed)
Pt feels like her vagina is on fire, she has Lichen Sclerosus.Pain is a 20,  She was seen in the ED, She's  in [facility], requesting needs a numbing cream sent to the [facility] [address] [place]  [place] [phone]

## 2021-08-05 ENCOUNTER — Encounter: Payer: Self-pay | Admitting: Advanced Practice Midwife

## 2021-08-05 ENCOUNTER — Ambulatory Visit: Payer: Medicare Other | Admitting: Advanced Practice Midwife

## 2021-08-05 ENCOUNTER — Other Ambulatory Visit (HOSPITAL_COMMUNITY)
Admission: RE | Admit: 2021-08-05 | Discharge: 2021-08-05 | Disposition: A | Discharge status: Home / Self Care | Payer: Medicare Other | Source: Ambulatory Visit | Attending: Advanced Practice Midwife | Admitting: Advanced Practice Midwife

## 2021-08-05 ENCOUNTER — Other Ambulatory Visit: Payer: Self-pay

## 2021-08-05 VITALS — BP 120/80 | Ht 64.0 in | Wt 167.0 lb

## 2021-08-05 DIAGNOSIS — R102 Pelvic and perineal pain: Secondary | ICD-10-CM | POA: Diagnosis not present

## 2021-08-05 MED ORDER — LIDOCAINE HCL URETHRAL/MUCOSAL 2 % EX PRSY: 1.0000 "application " | PREFILLED_SYRINGE | CUTANEOUS | 1 refills | Status: DC | PRN

## 2021-08-05 NOTE — Patient Instructions (Addendum)
Zinc Oxide cream, ointment, paste What is this medication? ZINC OXIDE (zingk  OX ide) is used to treat or prevent minor skin irritations such as burns, cuts, and diaper rash. Some products may be used as a sunscreen. This medicine may be used for other purposes; ask your health care provider or pharmacist if you have questions. COMMON BRAND NAME(S): Aquaphor, Aquaphor 3 IN 1 Diaper Rash, Aquaphor Baby Fast Relief Diaper Rash, Aquaphor Fast Relief Diaper Rash, Balmex, Boudreaux Butt Paste, Carlesta, Coppertone, COZIMA, Desitin, Desitin Maximum Strength, Desitin Rapid Relief, Diaper Rash, Dr. Thompson Caul, DynaShield, [place] Buttocks, Medi-Paste, Novana Protect, Triple Paste, Z-Bum What should I tell my care team before I take this medication? They need to know if you have any of these conditions: an unusual or allergic reaction to zinc oxide, other medicines, foods, dyes, or preservatives pregnant or trying to get pregnant breast-feeding How should I use this medication? This medicine is for external use only. Do not take by mouth. Follow the directions on the prescription or product label. Wash your hands before and after use. Apply a generous amount to the affected area. Do not cover with a bandage or dressing unless your doctor or health care professional tells you to. Do not get this medicine in your eyes. If you do, rinse out with plenty of cool tap water. Talk to your pediatrician regarding the use of this medicine in children. While this drug may be prescribed for selected conditions, precautions do apply. Overdosage: If you think you have taken too much of this medicine contact a poison control center or emergency room at once. NOTE: This medicine is only for you. Do not share this medicine with others. What if I miss a dose? If you miss a dose, use it as soon as you can. If it is almost time for your next dose, use only that dose. Do not use double or extra doses. What may interact with this  medication? Interactions are not expected. Do not use other skin products at the same site without asking your doctor or health care professional. This list may not describe all possible interactions. Give your health care provider a list of all the medicines, herbs, non-prescription drugs, or dietary supplements you use. Also tell them if you smoke, drink alcohol, or use illegal drugs. Some items may interact with your medicine. What should I watch for while using this medication? Tell your doctor or health care professional if the area you are treating does not get better within a week. What side effects may I notice from receiving this medication? Side effects that you should report to your doctor or health care professional as soon as possible: allergic reactions like skin rash, itching or hives, swelling of the face, lips, or tongue This list may not describe all possible side effects. Call your doctor for medical advice about side effects. You may report side effects to FDA at 1-800-FDA-1088. Where should I keep my medication? Keep out of reach of children. Store at room temperature. Keep closed while not in use. Throw away an unused medicine after the expiration date. NOTE: This sheet is a summary. It may not cover all possible information. If you have questions about this medicine, talk to your doctor, pharmacist, or health care provider.  2022 Elsevier/Gold Standard (2015-07-08 00:00:00) Wound Care, Adult Taking care of your wound properly can help to prevent pain, infection, and scarring. It can also help your wound heal more quickly. Follow instructions from your health care provider about  how to care for your wound. Supplies needed: Soap and water. Wound cleanser, saline, or germ-free (sterile) water. Gauze. If needed, a clean bandage (dressing) or other type of wound dressing material to cover or place in the wound. Follow your health care provider's instructions about what dressing  supplies to use. Cream or topical ointment to apply to the wound, if told by your health care provider. How to care for your wound Cleaning the wound Ask your health care provider how to clean the wound. This may include: Using mild soap and water, a wound cleanser, saline, or sterile water. Using a clean gauze to pat the wound dry after cleaning it. Do not rub or scrub the wound. Dressing care Wash your hands with soap and water for at least 20 seconds before and after you change the dressing. If soap and water are not available, use hand sanitizer. Change your dressing as told by your health care provider. This may include: Cleaning or rinsing out (irrigating) the wound. Application of cream or topical ointment, if told by your health care provider. Placing a dressing over the wound or in the wound (packing). Covering the wound with an outer dressing. Leave stitches (sutures), staples, skin glue, or adhesive strips in place. These skin closures may need to stay in place for 2 weeks or longer. If adhesive strip edges start to loosen and curl up, you may trim the loose edges. Do not remove adhesive strips completely unless your health care provider tells you to do that. Ask your health care provider when you can leave the wound uncovered. Checking for infection Check your wound area every day for signs of infection. Check for: More redness, swelling, or pain. Fluid or blood. Warmth. Pus or a bad smell.  Follow these instructions at home Medicines If you were prescribed an antibiotic medicine, cream, or ointment, take or apply it as told by your health care provider. Do not stop using the antibiotic even if your condition improves. If you were prescribed pain medicine, take it 30 minutes before you do any wound care or as told by your health care provider. Take over-the-counter and prescription medicines only as told by your health care provider. Eating and drinking Eat a diet that  includes protein, vitamin A, vitamin C, and other nutrient-rich foods to help the wound heal. Foods rich in protein include meat, fish, eggs, dairy, beans, and nuts. Foods rich in vitamin A include carrots and dark green, leafy vegetables. Foods rich in vitamin C include citrus fruits, tomatoes, broccoli, and peppers. Drink enough fluid to keep your urine pale yellow. General instructions Do not take baths, swim, or use a hot tub until your health care provider approves. Ask your health care provider if you may take showers. You may only be allowed to take sponge baths. Do not scratch or pick at the wound. Keep it covered as told by your health care provider. Return to your normal activities as told by your health care provider. Ask your health care provider what activities are safe for you. Protect your wound from the sun when you are outside for the first 6 months, or for as long as told by your health care provider. Cover up the scar area or apply sunscreen that has an SPF of at least 24. Do not use any products that contain nicotine or tobacco. These products include cigarettes, chewing tobacco, and vaping devices, such as e-cigarettes. If you need help quitting, ask your health care provider. Keep all follow-up  visits. This is important. Contact a health care provider if: You received a tetanus shot and you have swelling, severe pain, redness, or bleeding at the injection site. Your pain is not controlled with medicine. You have any of these signs of infection: More redness, swelling, or pain around the wound. Fluid or blood coming from the wound. Warmth coming from the wound. A fever or chills. You are nauseous or you vomit. You are dizzy. You have a new rash or hardness around the wound. Get help right away if: You have a red streak of skin near the area around your wound. Pus or a bad smell coming from the wound. Your wound has been closed with staples, sutures, skin glue, or  adhesive strips and it begins to open up and separate. Your wound is bleeding, and the bleeding does not stop with gentle pressure. These symptoms may represent a serious problem that is an emergency. Do not wait to see if the symptoms will go away. Get medical help right away. Call your local emergency services (911 in the [place] Do not drive yourself to the hospital. Summary Always wash your hands with soap and water for at least 20 seconds before and after changing your dressing. Change your dressing as told by your health care provider. To help with healing, eat foods that are rich in protein, vitamin A, vitamin C, and other nutrients. Check your wound every day for signs of infection. Contact your health care provider if you think that your wound is infected. This information is not intended to replace advice given to you by your health care provider. Make sure you discuss any questions you have with your health care provider. Document Revised: 10/12/2020 Document Reviewed: 10/12/2020 Elsevier Patient Education  [place].

## 2021-08-08 ENCOUNTER — Encounter: Payer: Self-pay | Admitting: Advanced Practice Midwife

## 2021-08-08 ENCOUNTER — Other Ambulatory Visit: Payer: Self-pay | Admitting: Advanced Practice Midwife

## 2021-08-08 NOTE — Progress Notes (Signed)
Patient ID: Brandi Mahoney, female   DOB: July 09, 1955, 66 y.o.   MRN: [id number]  Reason for Consult: lichen sclerous (Vagina feels inflamed,painful,swollen /)   Subjective:  Date of Service: 08/05/2021  HPI:  Brandi Mahoney is a 66 y.o. female being seen for vaginal pain. She has a history of Lichen Sclerosis and normally when it flares she has relief with clobetasol cream. She thought she was having a flare up last week and used the cream. She was out of town due to her son having medical treatments. The cream did not help and her symptoms worsened. Dr Kenton Kingfisher sent a prescription of Lidocaine gel for her, however, the pharmacy did not have it in stock. She went to an urgent care and got prescriptions for an oral steroid and a different steroid cream as well as oxycodone for the pain. She describes the pain as raw and burning especially with urination and the pain extends to the anus. It is worsening. She denies itching or odor. She denies change in skin care products.  Past Medical History:  Diagnosis Date   CAD (coronary artery disease)    a. 01/2010 Cath: mild atherosclerosis in the mid LCX after takeoff of OM1, mild diff LAD dzs;  b. 07/2013 MV: EF 67%, no ischemia.   GAD (generalized anxiety disorder)    History of kidney stones    HLD (hyperlipidemia)    Hypothyroidism    Major depressive disorder, recurrent episode, in full remission ([place]) 09/08/2008   Qualifier: Diagnosis of  By: Lorelei Pont MD, [place]     Migraine headache    Myocarditis ([place])    a. 01/2010 presented w/ c/p and elev trop-->Cath: mild atherosclerosis in the mid LCX after takeoff of OM1, mild diff LAD dzs;  b. 03/2010 Echo: EF 60-65%, DD, mild LVH;  c. 07/2011 Echo: EF 55-60%, mild LVH, no rwma.   Sleep apnea    has c-pap.  wears off and on - not lately  using  per reported 07-29-2020   Type 2 diabetes, diet controlled ([place])    Family History  Problem Relation Age of Onset   Coronary artery disease Mother     Diabetes Mother    ALS Mother    Hyperlipidemia Father    Breast cancer Paternal Grandmother    Lung cancer Paternal Grandfather        CAD   Other Daughter        gluten   Down syndrome Son    Colon cancer Maternal Grandfather        dx early 12's    Breast cancer Sister 101   Esophageal cancer Neg Hx    Liver cancer Neg Hx    Pancreatic cancer Neg Hx    Rectal cancer Neg Hx    Stomach cancer Neg Hx    Past Surgical History:  Procedure Laterality Date   APPENDECTOMY  1974   BREAST SURGERY Right 1988   fatty tumore from breast   CARDIAC CATHETERIZATION  8/11   no significant -[facility]- Dr Rockey Situ,   CHOLECYSTECTOMY  2002   COLONOSCOPY  2017   CYST EXCISION     ovarian cyst 1994   NEPHROLITHOTOMY Right 08/20/2014   Procedure: RIGHT PERCUTANEOUS NEPHROLITHOTOMY ;  Surgeon: Malka So, MD;  Location: [facility];  Service: Urology;  Laterality: Right;   POLYPECTOMY     RECTAL EXAM UNDER ANESTHESIA N/A 08/02/2020   Procedure: Darci Needle EXAM UNDER ANESTHESIA INJECTION OF PERIANAL BOTOX;  Surgeon: Nadeen Landau  M, MD;  Location: [facility];  Service: General;  Laterality: N/A;   TOTAL ABDOMINAL HYSTERECTOMY W/ BILATERAL SALPINGOOPHORECTOMY  2000   no ovaries, uterus, or cervix.    Short Social History:  Social History   Tobacco Use   Smoking status: Former    Packs/day: 0.35    Years: 35.00    Pack years: 12.25    Types: Cigarettes    Quit date: 09/25/2008    Years since quitting: 12.8   Smokeless tobacco: Never   Tobacco comments:    quit in 2007-2008, social smoker  Substance Use Topics   Alcohol use: Not Currently    Allergies  Allergen Reactions   Pseudoeph-Doxylamine-Dm-Apap Anaphylaxis    Allergy to nyquil   Gluten Other (See Comments)    Gi upset   Penicillins Itching and Rash    Current Outpatient Medications  Medication Sig Dispense Refill   AMBULATORY NON FORMULARY MEDICATION Medication Name: Nitroglycerin gel 0.125 % please apply pea  sized amount to rectum three times a day x 6-8 weeks 30 g 1   aspirin EC 81 MG tablet Take 81 mg by mouth daily.     betamethasone dipropionate (DIPROLENE) 0.05 % ointment Apply topically 2 (two) times daily. 30 g 0   clobetasol cream (TEMOVATE) 0.05 % APPLY TWO TIMES DAILY FOR TWO WEEKS THEN USE AS NEEDED FOR LICHEN SCLEROSIS 45 g 3   cyclobenzaprine (FLEXERIL) 10 MG tablet TAKE 1 TABLET (10 MG TOTAL) BY MOUTH 2 (TWO) TIMES DAILY AS NEEDED (PAIN).     fenofibrate 160 MG tablet Take 1 tablet (160 mg total) by mouth daily. 30 tablet 2   gabapentin (NEURONTIN) 300 MG capsule TAKE ONE CAPSULE BY MOUTH AT BEDTIME 90 capsule 3   glucose blood (ACCU-CHEK AVIVA) test strip Use to check blood sugar once daily 100 each 3   ibuprofen (ADVIL) 600 MG tablet Take 1 tablet (600 mg total) by mouth every 6 (six) hours as needed. 30 tablet 0   Lactobacillus-Inulin (CULTURELLE ADULT ULT BALANCE PO)      levothyroxine (SYNTHROID) 75 MCG tablet TAKE ONE TABLET BY MOUTH EVERY MORNING BEFORE BREAKFAST 30 tablet 5   lidocaine (XYLOCAINE) 2 % jelly 1 application by Other route as needed. 10 mL 1   Magnesium 250 MG TABS Take 1 tablet by mouth daily.     methylPREDNISolone (MEDROL DOSEPAK) 4 MG TBPK tablet Take 6 pills on day one then decrease by 1 pill each day 21 tablet 0   metoprolol succinate (TOPROL-XL) 50 MG 24 hr tablet Take 1 tablet (50 mg total) by mouth daily. Take with or immediately following a meal. 90 tablet 3   Multiple Vitamins-Minerals (ONE-A-DAY WOMENS PETITES PO) Take 2 tablets by mouth daily.     nitroGLYCERIN (NITROSTAT) 0.4 MG SL tablet 1 tablet under tongue at onset of chest pain; you may repeat every 5 minutes for up to 3 doses. 25 tablet 3   ondansetron (ZOFRAN ODT) 8 MG disintegrating tablet 1/2- 1 tablet q 8 hr prn nausea, vomiting 20 tablet 0   oxyCODONE (OXY IR/ROXICODONE) 5 MG immediate release tablet Take 1 tablet (5 mg total) by mouth every 4 (four) hours as needed for severe pain. 15 tablet  0   pantoprazole (PROTONIX) 40 MG tablet TAKE ONE TABLET BY MOUTH DAILY 90 tablet 0   pravastatin (PRAVACHOL) 40 MG tablet TAKE ONE TABLET BY MOUTH AT BEDTIME 90 tablet 1   sertraline (ZOLOFT) 50 MG tablet TAKE ONE TABLET BY  MOUTH DAILY 90 tablet 0   nortriptyline (PAMELOR) 10 MG capsule Take by mouth.     No current facility-administered medications for this visit.    Review of Systems  Constitutional:  Negative for chills and fever.  HENT:  Negative for congestion, ear discharge, ear pain, hearing loss, sinus pain and sore throat.   Eyes:  Negative for blurred vision and double vision.  Respiratory:  Negative for cough, shortness of breath and wheezing.   Cardiovascular:  Negative for chest pain, palpitations and leg swelling.  Gastrointestinal:  Negative for abdominal pain, blood in stool, constipation, diarrhea, heartburn, melena, nausea and vomiting.  Genitourinary:  Negative for dysuria, flank pain, frequency, hematuria and urgency.       Positive for vulvar/vaginal pain  Musculoskeletal:  Negative for back pain, joint pain and myalgias.  Skin:  Negative for itching and rash.  Neurological:  Negative for dizziness, tingling, tremors, sensory change, speech change, focal weakness, seizures, loss of consciousness, weakness and headaches.  Endo/Heme/Allergies:  Negative for environmental allergies. Does not bruise/bleed easily.  Psychiatric/Behavioral:  Negative for depression, hallucinations, memory loss, substance abuse and suicidal ideas. The patient is not nervous/anxious and does not have insomnia.        Objective:  Objective   Vitals:   08/05/21 1037  BP: 120/80  Weight: 167 lb (75.8 kg)  Height: 5\' 4"  (1.626 m)   Body mass index is 28.67 kg/m. Constitutional: Well nourished, well developed female in mild acute distress.  HEENT: normal Skin: Warm and dry.  Cardiovascular: Regular rate and rhythm.   Respiratory: Clear to auscultation bilateral. Normal respiratory  effort Neuro: DTRs 2+, Cranial nerves grossly intact Psych: Alert and Oriented x3. No memory deficits. Normal mood and affect.  MS: normal gait, normal bilateral lower extremity ROM/strength/stability.  Pelvic exam:  is not limited by body habitus EGBUS: excoriated at perineum with general redness from perineum to anus Vagina: excoriated areas in vulva, no drainage or appearance of infection, no Lichen Sclerosis flare up seen   Assessment/Plan:     66 y.o. female with vulvar/perineal skin breakdown  Wound care: tub soaks in sea salt, light application of coconut oil to affected areas, increase intake of protein, vitamin C, zinc, open to air as much as possible, lidocaine gel for pain relief, use steroid cream sparingly, increase h2o intake when taking oxycodone to avoid constipation Follow up as needed   [facility] 08/08/2021, 4:13 PM

## 2021-08-09 LAB — CERVICOVAGINAL ANCILLARY ONLY
Bacterial Vaginitis (gardnerella): NEGATIVE
Candida Glabrata: NEGATIVE
Candida Vaginitis: NEGATIVE
Comment: NEGATIVE
Comment: NEGATIVE
Comment: NEGATIVE

## 2021-08-25 DIAGNOSIS — M62412 Contracture of muscle, left shoulder: Secondary | ICD-10-CM | POA: Diagnosis not present

## 2021-08-25 DIAGNOSIS — R293 Abnormal posture: Secondary | ICD-10-CM | POA: Diagnosis not present

## 2021-08-25 DIAGNOSIS — M9902 Segmental and somatic dysfunction of thoracic region: Secondary | ICD-10-CM | POA: Diagnosis not present

## 2021-08-25 DIAGNOSIS — M62830 Muscle spasm of back: Secondary | ICD-10-CM | POA: Diagnosis not present

## 2021-08-25 DIAGNOSIS — M9901 Segmental and somatic dysfunction of cervical region: Secondary | ICD-10-CM | POA: Diagnosis not present

## 2021-08-27 ENCOUNTER — Other Ambulatory Visit: Payer: Self-pay | Admitting: Family Medicine

## 2021-08-29 ENCOUNTER — Other Ambulatory Visit: Payer: Self-pay | Admitting: Family Medicine

## 2021-09-12 ENCOUNTER — Other Ambulatory Visit: Payer: Self-pay | Admitting: Cardiovascular Disease

## 2021-09-13 NOTE — Telephone Encounter (Signed)
LVM to schedule

## 2021-09-13 NOTE — Telephone Encounter (Signed)
Patient needs an appt.

## 2021-09-27 NOTE — Telephone Encounter (Signed)
Attempted to schedule.  

## 2021-09-28 ENCOUNTER — Other Ambulatory Visit (INDEPENDENT_AMBULATORY_CARE_PROVIDER_SITE_OTHER): Payer: Medicare Other

## 2021-09-28 DIAGNOSIS — E039 Hypothyroidism, unspecified: Secondary | ICD-10-CM | POA: Diagnosis not present

## 2021-09-28 DIAGNOSIS — E782 Mixed hyperlipidemia: Secondary | ICD-10-CM

## 2021-09-28 DIAGNOSIS — E559 Vitamin D deficiency, unspecified: Secondary | ICD-10-CM

## 2021-09-28 DIAGNOSIS — E119 Type 2 diabetes mellitus without complications: Secondary | ICD-10-CM

## 2021-09-28 DIAGNOSIS — Z79899 Other long term (current) drug therapy: Secondary | ICD-10-CM

## 2021-09-28 LAB — CBC WITH DIFFERENTIAL/PLATELET
Basophils Absolute: 0 10*3/uL (ref 0.0–0.1)
Basophils Relative: 0.6 % (ref 0.0–3.0)
Eosinophils Absolute: 0.2 10*3/uL (ref 0.0–0.7)
Eosinophils Relative: 2.6 % (ref 0.0–5.0)
HCT: 40.9 % (ref 36.0–46.0)
Hemoglobin: 13.3 g/dL (ref 12.0–15.0)
Lymphocytes Relative: 34.2 % (ref 12.0–46.0)
Lymphs Abs: 2.2 10*3/uL (ref 0.7–4.0)
MCHC: 32.5 g/dL (ref 30.0–36.0)
MCV: 82.7 fl (ref 78.0–100.0)
Monocytes Absolute: 0.5 10*3/uL (ref 0.1–1.0)
Monocytes Relative: 7.7 % (ref 3.0–12.0)
Neutro Abs: 3.5 10*3/uL (ref 1.4–7.7)
Neutrophils Relative %: 54.9 % (ref 43.0–77.0)
Platelets: 254 10*3/uL (ref 150.0–400.0)
RBC: 4.95 Mil/uL (ref 3.87–5.11)
RDW: 14.5 % (ref 11.5–15.5)
WBC: 6.3 10*3/uL (ref 4.0–10.5)

## 2021-09-28 LAB — VITAMIN D 25 HYDROXY (VIT D DEFICIENCY, FRACTURES): VITD: 30.27 ng/mL (ref 30.00–100.00)

## 2021-09-28 LAB — BASIC METABOLIC PANEL
BUN: 10 mg/dL (ref 6–23)
CO2: 30 mEq/L (ref 19–32)
Calcium: 9.7 mg/dL (ref 8.4–10.5)
Chloride: 103 mEq/L (ref 96–112)
Creatinine, Ser: 0.74 mg/dL (ref 0.40–1.20)
GFR: 84.95 mL/min (ref >60.00)
Glucose, Bld: 134 mg/dL — ABNORMAL HIGH (ref 70–99)
Potassium: 4.5 mEq/L (ref 3.5–5.1)
Sodium: 140 mEq/L (ref 135–145)

## 2021-09-28 LAB — LIPID PANEL
Cholesterol: 141 mg/dL (ref 0–200)
HDL: 51.5 mg/dL (ref >39.00)
LDL Cholesterol: 56 mg/dL (ref 0–99)
NonHDL: 89.55
Total CHOL/HDL Ratio: 3
Triglycerides: 167 mg/dL — ABNORMAL HIGH (ref 0.0–149.0)
VLDL: 33.4 mg/dL (ref 0.0–40.0)

## 2021-09-28 LAB — HEPATIC FUNCTION PANEL
ALT: 48 U/L — ABNORMAL HIGH (ref 0–35)
AST: 42 U/L — ABNORMAL HIGH (ref 0–37)
Albumin: 4.8 g/dL (ref 3.5–5.2)
Alkaline Phosphatase: 74 U/L (ref 39–117)
Bilirubin, Direct: 0.1 mg/dL (ref 0.0–0.3)
Total Bilirubin: 0.5 mg/dL (ref 0.2–1.2)
Total Protein: 7.1 g/dL (ref 6.0–8.3)

## 2021-09-28 LAB — TSH: TSH: 8.67 u[IU]/mL — ABNORMAL HIGH (ref 0.35–5.50)

## 2021-09-28 LAB — HEMOGLOBIN A1C: Hgb A1c MFr Bld: 7.4 % — ABNORMAL HIGH (ref 4.6–6.5)

## 2021-09-29 ENCOUNTER — Other Ambulatory Visit: Payer: Self-pay | Admitting: *Deleted

## 2021-09-29 DIAGNOSIS — G4733 Obstructive sleep apnea (adult) (pediatric): Secondary | ICD-10-CM | POA: Diagnosis not present

## 2021-09-29 MED ORDER — METOPROLOL SUCCINATE ER 50 MG PO TB24: 50.0000 mg | ORAL_TABLET | Freq: Every day | ORAL | 0 refills | Status: DC

## 2021-09-30 NOTE — Telephone Encounter (Signed)
Scheduled

## 2021-10-05 ENCOUNTER — Ambulatory Visit (INDEPENDENT_AMBULATORY_CARE_PROVIDER_SITE_OTHER): Payer: Medicare Other | Admitting: Family Medicine

## 2021-10-05 ENCOUNTER — Encounter: Payer: Self-pay | Admitting: Family Medicine

## 2021-10-05 VITALS — BP 140/80 | HR 85 | Temp 98.6°F | Ht 63.75 in | Wt 168.5 lb

## 2021-10-05 DIAGNOSIS — E119 Type 2 diabetes mellitus without complications: Secondary | ICD-10-CM

## 2021-10-05 DIAGNOSIS — Z23 Encounter for immunization: Secondary | ICD-10-CM | POA: Diagnosis not present

## 2021-10-05 DIAGNOSIS — Z0000 Encounter for general adult medical examination without abnormal findings: Secondary | ICD-10-CM

## 2021-10-05 DIAGNOSIS — E039 Hypothyroidism, unspecified: Secondary | ICD-10-CM | POA: Diagnosis not present

## 2021-10-05 DIAGNOSIS — G4733 Obstructive sleep apnea (adult) (pediatric): Secondary | ICD-10-CM | POA: Diagnosis not present

## 2021-10-05 DIAGNOSIS — R7989 Other specified abnormal findings of blood chemistry: Secondary | ICD-10-CM | POA: Diagnosis not present

## 2021-10-05 MED ORDER — NYSTATIN 100000 UNIT/ML MT SUSP: 5.0000 mL | Freq: Four times a day (QID) | OROMUCOSAL | 0 refills | Status: DC

## 2021-10-05 NOTE — Progress Notes (Signed)
? ? ?Jud Fanguy T. Calynn Ferrero, MD, [facility] ?Therapist, music at [facility] ?[place] ?[place], [address] ? ?Phone: [phone]  FAX: [phone] ? ?Tynslee Bowlds Haupert - 66 y.o. female  MRN [id number]  Date of Birth: 07/10/1955 ? ?Date: 10/05/2021  PCP: Owens Loffler, MD  Referral: Owens Loffler, MD ? ?Chief Complaint  ?Patient presents with  ? Medicare Wellness  ? ? ?This visit occurred during the SARS-CoV-2 public health emergency.  Safety protocols were in place, including screening questions prior to the visit, additional usage of staff PPE, and extensive cleaning of exam room while observing appropriate contact time as indicated for disinfecting solutions.  ? ?Patient Care Team: ?Owens Loffler, MD as PCP - General ?Bary Castilla Forest Gleason, MD (General Surgery) ?Ammie Dalton, Okey Regal, MD (Unknown Physician Specialty) ?Subjective:  ? ?Eyanna Mcgonagle Vazquez is a 66 y.o. pleasant patient who presents for a medicare wellness examination: ? ?Health Maintenance Summary Reviewed and updated, unless pt declines services. ? ?Tobacco History Reviewed. Non-smoker ?Alcohol: No concerns, no excessive use ?Exercise Habits: Some activity, rec at least 30 mins 5 times a week ?STD concerns: none ?Drug Use: None ?Birth control method: n/a ?Menses regular: n/a ?Lumps or breast concerns: no ?Breast Cancer Family History: no ? ?Pneumonia vaccine - Prevnar 20. ?Covid bivalent - has had them ? ?Tdap ? ?Flu shot  ? ?3 things on her hands - little by little has been getting bigger ? ?R thigh wart. ? ?Sores in her mouth for a month. ? ?Painful sores in the math. ? ?Patient has obstructive sleep apnea.  The patient is using a CPAP machine, and she is using this regularly and benefiting from the Pap machine. ?-Improvement in decrease risk with hypertension as well as coronary disease. ? ?Diabetes Mellitus: Tolerating Medications: yes ?Compliance with diet: fair, Body mass index is 29.15 kg/m?. ?Exercise: minimal  / intermittent ?Avg blood sugars at home: not checking ?Foot problems: none ?Hypoglycemia: none ?No nausea, vomitting, blurred vision, polyuria. ? ?Lab Results  ?Component Value Date  ? HGBA1C 7.4 (H) 09/28/2021  ? HGBA1C 7.0 (H) 09/20/2020  ? HGBA1C 6.9 (H) 01/23/2020  ? ?Lab Results  ?Component Value Date  ? MICROALBUR <0.7 01/23/2020  ? [place] 56 09/28/2021  ? CREATININE 0.74 09/28/2021  ? ? ?Wt Readings from Last 3 Encounters:  ?10/05/21 168 lb 8 oz (76.4 kg)  ?08/05/21 167 lb (75.8 kg)  ?07/30/21 170 lb (77.1 kg)  ?  ? ?Health Maintenance  ?Topic Date Due  ? OPHTHALMOLOGY EXAM  Never done  ? TETANUS/TDAP  05/25/2019  ? FOOT EXAM  01/27/2021  ? DEXA SCAN  Never done  ? INFLUENZA VACCINE  01/17/2022  ? MAMMOGRAM  03/23/2022  ? HEMOGLOBIN A1C  03/30/2022  ? COLONOSCOPY (Pts 45-61yr Insurance coverage will need to be confirmed)  01/03/2023  ? Pneumonia Vaccine 66 Years old  Completed  ? COVID-19 Vaccine  Completed  ? Hepatitis C Screening  Completed  ? HIV Screening  Completed  ? Zoster Vaccines- Shingrix  Completed  ? HPV VACCINES  Aged Out  ? URINE MICROALBUMIN  Discontinued  ? ?Immunization History  ?Administered Date(s) Administered  ? Influenza Split 03/19/2013  ? Influenza,inj,Quad PF,6+ Mos 04/20/2017, 04/04/2018, 04/07/2019  ? Influenza-Unspecified 03/20/2015  ? Moderna Sars-Covid-2 Vaccination 08/13/2019, 09/12/2019  ? PFIZER(Purple Top)SARS-COV-2 Vaccination 04/13/2020, 11/20/2020  ? PNEUMOCOCCAL CONJUGATE-20 10/05/2021  ? PPension scheme manager18yr& up 04/19/2021  ? Pneumococcal Polysaccharide-23 03/02/2016  ? Td 05/24/2009  ?  Zoster Recombinat (Shingrix) 08/16/2018, 01/10/2019  ? ? ?Patient Active Problem List  ? Diagnosis Date Noted  ? Coronary artery disease involving native coronary artery of native heart with angina pectoris ([place])   ?  Priority: High  ? Mixed hyperlipidemia 04/01/2010  ?  Priority: High  ? HYPERTRIGLYCERIDEMIA 07/21/2009  ?  Priority: High  ? Hypothyroidism  in adult 09/08/2008  ?  Priority: High  ? Obstructive sleep apnea 09/25/2013  ?  Priority: Medium   ? Tobacco abuse, in remission 09/19/2013  ?  Priority: Medium   ? Major depressive disorder, recurrent episode, in full remission ([place]) 09/08/2008  ?  Priority: Medium   ? Lichen sclerosus 25/85/2778  ? Type 2 diabetes, diet controlled ([place])   ? GAD (generalized anxiety disorder)   ? IBS (irritable bowel syndrome) 09/26/2012  ? 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 ASTHMA 09/08/2008  ? ? ?Past Medical History:  ?Diagnosis Date  ? CAD (coronary artery disease)   ? a. 01/2010 Cath: mild atherosclerosis in the mid LCX after takeoff of OM1, mild diff LAD dzs;  b. 07/2013 MV: EF 67%, no ischemia.  ? GAD (generalized anxiety disorder)   ? History of kidney stones   ? HLD (hyperlipidemia)   ? Hypothyroidism   ? Major depressive disorder, recurrent episode, in full remission ([place]) 09/08/2008  ? Qualifier: Diagnosis of  By: Drevion Offord MD, Frederico Hamman    ? Migraine headache   ? Myocarditis ([place])   ? a. 01/2010 presented w/ c/p and elev trop-->Cath: mild atherosclerosis in the mid LCX after takeoff of OM1, mild diff LAD dzs;  b. 03/2010 Echo: EF 60-65%, DD, mild LVH;  c. 07/2011 Echo: EF 55-60%, mild LVH, no rwma.  ? Sleep apnea   ? has c-pap.  wears off and on - not lately  using  per reported 07-29-2020  ? Type 2 diabetes, diet controlled ([place])   ? ? ?Past Surgical History:  ?Procedure Laterality Date  ? APPENDECTOMY  1974  ? BREAST SURGERY Right 1988  ? fatty tumore from breast  ? CARDIAC CATHETERIZATION  8/11  ? no significant -[facility]- Dr Rockey Situ,  ? CHOLECYSTECTOMY  2002  ? COLONOSCOPY  2017  ? CYST EXCISION    ? ovarian cyst 1994  ? NEPHROLITHOTOMY Right 08/20/2014  ? Procedure: RIGHT PERCUTANEOUS NEPHROLITHOTOMY ;  Surgeon: Malka So, MD;  Location: [facility];  Service: Urology;  Laterality: Right;  ? POLYPECTOMY    ? RECTAL EXAM UNDER ANESTHESIA N/A 08/02/2020  ? Procedure: [name] UNDER ANESTHESIA INJECTION OF PERIANAL BOTOX;   Surgeon: Ileana Roup, MD;  Location: [facility];  Service: General;  Laterality: N/A;  ? TOTAL ABDOMINAL HYSTERECTOMY W/ BILATERAL SALPINGOOPHORECTOMY  2000  ? no ovaries, uterus, or cervix.  ? ? ?Family History  ?Problem Relation Age of Onset  ? Coronary artery disease Mother   ? Diabetes Mother   ? ALS Mother   ? Hyperlipidemia Father   ? Breast cancer Paternal Grandmother   ? Lung cancer Paternal Grandfather   ?     CAD  ? Other Daughter   ?     gluten  ? Down syndrome Son   ? Colon cancer Maternal Grandfather   ?     dx early 82's   ? Breast cancer Sister 30  ? Esophageal cancer Neg Hx   ? Liver cancer Neg Hx   ? Pancreatic cancer Neg Hx   ? Rectal cancer Neg Hx   ?  Stomach cancer Neg Hx   ? ? ?Past Medical History, Surgical History, Social History, Family History, Problem List, Medications, and Allergies have been reviewed and updated if relevant. ? ?Review of Systems: Pertinent positives are listed above.  Otherwise, a full 14 point review of systems has been done in full and it is negative except where it is noted positive. ? ?Objective:  ? ?BP 140/80   Pulse 85   Temp 98.6 ?F (37 ?C) (Oral)   Ht 5' 3.75" (1.619 m)   Wt 168 lb 8 oz (76.4 kg)   SpO2 97%   BMI 29.15 kg/m?  ? ?  11/01/2018  ? 11:59 PM 01/28/2020  ? 11:33 AM 03/01/2021  ?  1:36 PM 07/30/2021  ?  5:12 PM 10/05/2021  ? 10:55 AM  ?Fall Risk  ?Falls in the past year?  0   0  ?Patient Fall Risk Level Low fall risk  Low fall risk Low fall risk   ? ?Ideal Body Weight:  ?Weight in (lb) to have BMI = 25: 144.2 ?Hearing Screening  ?Method: Audiometry  ? '500Hz'$  '1000Hz'$  '2000Hz'$  '4000Hz'$   ?Right ear '25 25 20 25  '$ ?Left ear '25 20 20 25  '$ ?Vision Screening - Comments:: [place] 2022 ? ?  10/05/2021  ? 10:55 AM 01/28/2020  ? 11:33 AM 05/23/2017  ?  8:55 AM  ?Depression screen PHQ 2/9  ?Decreased Interest 0 0 0  ?Down, Depressed, Hopeless 0 0 0  ?PHQ - 2 Score 0 0 0  ? ? ? ?GEN: well developed, well nourished, no acute  distress ?Eyes: conjunctiva and lids normal, PERRLA, EOMI ?ENT: TM clear, nares clear, oral exam WNL ?Neck: supple, no lymphadenopathy, no thyromegaly, no JVD ?Pulm: clear to auscultation and percussion, respiratory effort n

## 2021-10-08 ENCOUNTER — Other Ambulatory Visit: Payer: Self-pay | Admitting: Family Medicine

## 2021-10-20 DIAGNOSIS — L900 Lichen sclerosus et atrophicus: Secondary | ICD-10-CM | POA: Diagnosis not present

## 2021-10-21 NOTE — Addendum Note (Signed)
Addended by: Carter Kitten on: 10/21/2021 09:05 AM ? ? Modules accepted: Orders ? ?

## 2021-10-23 ENCOUNTER — Other Ambulatory Visit: Payer: Self-pay | Admitting: Family Medicine

## 2021-11-01 ENCOUNTER — Other Ambulatory Visit: Payer: Self-pay | Admitting: Cardiovascular Disease

## 2021-11-01 NOTE — Progress Notes (Deleted)
Date:  11/01/2021   ID:  Brandi Mahoney, DOB 1956-02-24, MRN [id number]  Patient Location:  [address] [place] [address]   Provider location:   [facility], [place] office  PCP:  Owens Loffler, MD  Cardiologist:  [facility]   No chief complaint on file.    History of Present Illness:    Brandi Mahoney is a 66 y.o. female  past medical history of hx of troponin elevation to 17,  cardiac catheterization August 2011 showing nonobstructive left circumflex disease, presentation felt to be secondary to a viral myocarditis with low normal systolic function by cath,  repeat admission to [facility] for chest discomfort October 2011, felt to be noncardiac with echocardiogram normal systolic function  Admitted to hospital 06/23/2016 chest pain Who presents for shortness of breath, tachycardia  Last seen in clinic by myself January 2022   In follow-up today reports having chronic shortness of breath Insomnia, on trazodone, Sleeping better with trazodone, 7-8 hrs, Very sedentary during the day Fatigues quickly  Stationary bike this Am 9 miles, 1 hrs, in front of TV  Stress at [facility] care of son who has Down's syndrome Previous hospitalizations for chest pain symptoms Work-up negative  Last week, spike in blood pressure 170s, was short of breath laying flat in the bed Eventually symptoms resolved without intervention Prior prescription of nitro 2018, expired  CT scan in 2018,  No coronary calcification   EKG personally reviewed by myself on todays visit Shows normal sinus rhythm rate 63 bpm no significant ST-T wave changes   Other past medical history reviewed Previous CT scan chest in the emergency room showing no PE No significant coronary calcification   Stress test as an inpatient showing no ischemia 2018 Results reviewed with her including the CT scan, images were pulled up in the office and  discussed   On a prior office visit in 2015, she reported having chest pain in the center of her chest radiating to her left arm starting 2 days ago. General malaise felt queasy, lightheaded, had chest pain and left arm pain.   shortness of breath with exertion. She is worried about progression of her CAD. She went to the emergency room on the way home from work. Cardiac enzymes were negative x2 EKG essentially normal.    Given her chest pain a stress test was performed February 2015.  showed no ischemia. She had good exercise tolerance though on a treadmill did develop left chest and arm pain. She achieved target heart rate. EKG was essentially normal with no significant changes concerning for ischemia. No perfusion abnormality.    Cardiac catheterization January 20 2010: mild atherosclerosis in the mid left circumflex after the takeoff of the OM1, mild diffuse LAD disease repeat echocardiogram on March 19, 2010 showing ejection fraction 23-53%, diastolic dysfunction, mild LVH   Prior CV studies:   The following studies were reviewed today:    Past Medical History:  Diagnosis Date   CAD (coronary artery disease)    a. 01/2010 Cath: mild atherosclerosis in the mid LCX after takeoff of OM1, mild diff LAD dzs;  b. 07/2013 MV: EF 67%, no ischemia.   GAD (generalized anxiety disorder)    History of kidney stones    HLD (hyperlipidemia)    Hypothyroidism    Major depressive disorder, recurrent episode, in full remission ([place]) 09/08/2008   Qualifier: Diagnosis of  By: Lorelei Pont MD, Frederico Hamman  Migraine headache    Myocarditis ([place])    a. 01/2010 presented w/ c/p and elev trop-->Cath: mild atherosclerosis in the mid LCX after takeoff of OM1, mild diff LAD dzs;  b. 03/2010 Echo: EF 60-65%, DD, mild LVH;  c. 07/2011 Echo: EF 55-60%, mild LVH, no rwma.   Sleep apnea    has c-pap.  wears off and on - not lately  using  per reported 07-29-2020   Type 2 diabetes, diet controlled [facility])    Past Surgical  History:  Procedure Laterality Date   APPENDECTOMY  1974   BREAST SURGERY Right 1988   fatty tumore from breast   CARDIAC CATHETERIZATION  8/11   no significant -[facility]- Dr Rockey Situ,   CHOLECYSTECTOMY  2002   COLONOSCOPY  2017   CYST EXCISION     ovarian cyst 1994   NEPHROLITHOTOMY Right 08/20/2014   Procedure: RIGHT PERCUTANEOUS NEPHROLITHOTOMY ;  Surgeon: Malka So, MD;  Location: [facility];  Service: Urology;  Laterality: Right;   POLYPECTOMY     RECTAL EXAM UNDER ANESTHESIA N/A 08/02/2020   Procedure: Darci Needle EXAM UNDER ANESTHESIA INJECTION OF PERIANAL BOTOX;  Surgeon: Ileana Roup, MD;  Location: [facility];  Service: General;  Laterality: N/A;   TOTAL ABDOMINAL HYSTERECTOMY W/ BILATERAL SALPINGOOPHORECTOMY  2000   no ovaries, uterus, or cervix.     No outpatient medications have been marked as taking for the 11/04/21 encounter (Appointment) with Minna Merritts, MD.     Allergies:   Pseudoeph-doxylamine-dm-apap, Gluten, and Penicillins   Social History   Tobacco Use   Smoking status: Former    Packs/day: 0.35    Years: 35.00    Pack years: 12.25    Types: Cigarettes    Quit date: 09/25/2008    Years since quitting: 13.1   Smokeless tobacco: Never   Tobacco comments:    quit in 2007-2008, social smoker  Vaping Use   Vaping Use: Never used  Substance Use Topics   Alcohol use: Not Currently   Drug use: No     Family Hx: The patient's family history includes ALS in her mother; Breast cancer in her paternal grandmother; Breast cancer (age of onset: 8) in her sister; Colon cancer in her maternal grandfather; Coronary artery disease in her mother; Diabetes in her mother; Down syndrome in her son; Hyperlipidemia in her father; Lung cancer in her paternal grandfather; Other in her daughter. There is no history of Esophageal cancer, Liver cancer, Pancreatic cancer, Rectal cancer, or Stomach cancer.  ROS:   Please see the history of present illness.     Review of Systems  Constitutional: Negative.   HENT: Negative.    Respiratory:  Positive for shortness of breath.   Cardiovascular: Negative.   Gastrointestinal: Negative.   Musculoskeletal: Negative.   Neurological: Negative.   Psychiatric/Behavioral: Negative.    All other systems reviewed and are negative.   Labs/Other Tests and Data Reviewed:    Recent Labs: 09/28/2021: ALT 48; BUN 10; Creatinine, Ser 0.74; Hemoglobin 13.3; Platelets 254.0; Potassium 4.5; Sodium 140; TSH 8.67   Recent Lipid Panel Lab Results  Component Value Date/Time   CHOL 141 09/28/2021 10:34 AM   CHOL 151 10/17/2011 08:26 AM   TRIG 167.0 (H) 09/28/2021 10:34 AM   TRIG 136 10/17/2011 08:26 AM   HDL 51.50 09/28/2021 10:34 AM   HDL 44 10/17/2011 08:26 AM   CHOLHDL 3 09/28/2021 10:34 AM   LDLCALC 56 09/28/2021 10:34 AM   LDLCALC 80  10/17/2011 08:26 AM   LDLDIRECT 57.0 09/20/2020 10:33 AM    Wt Readings from Last 3 Encounters:  10/05/21 168 lb 8 oz (76.4 kg)  08/05/21 167 lb (75.8 kg)  07/30/21 170 lb (77.1 kg)     Exam:    Vital Signs: Vital signs may also be detailed in the HPI There were no vitals taken for this visit.   Constitutional:  oriented to person, place, and time. No distress.  HENT:  Head: Grossly normal Eyes:  no discharge. No scleral icterus.  Neck: No JVD, no carotid bruits  Cardiovascular: Regular rate and rhythm, no murmurs appreciated Pulmonary/Chest: Clear to auscultation bilaterally, no wheezes or rails Abdominal: Soft.  no distension.  no tenderness.  Musculoskeletal: Normal range of motion Neurological:  normal muscle tone. Coordination normal. No atrophy Skin: Skin warm and dry Psychiatric: normal affect, pleasant  ASSESSMENT & PLAN:    Shortness of breath Does not want Lasix given wish to avoid constipation in the setting of anal fissure Recommended regular walking program for conditioning  Mixed hyperlipidemia Cholesterol is at goal on the current lipid  regimen. No changes to the medications were made.  Chronic pain syndrome Followed by primary care  Major depressive disorder, recurrent episode, in full remission ([place]) Managed by primary care  Essential hypertension Rare spikes in blood pressure, etiology unclear Best option if blood pressure stays persistently elevated would be sublingual nitro but recommended caution , and to be used if pressure is persistently elevated, in effort to avoid trip to the emergency room   Total encounter time more than 25 minutes  Greater than 50% was spent in counseling and coordination of care with the patient   Signed, Ida [name], MD  11/01/2021 3:14 PM    [facility] [id number], [place], [place] [address]

## 2021-11-04 ENCOUNTER — Ambulatory Visit: Payer: Medicare Other | Admitting: Cardiovascular Disease

## 2021-11-04 DIAGNOSIS — E781 Pure hyperglyceridemia: Secondary | ICD-10-CM

## 2021-11-04 DIAGNOSIS — R079 Chest pain, unspecified: Secondary | ICD-10-CM

## 2021-11-04 DIAGNOSIS — E782 Mixed hyperlipidemia: Secondary | ICD-10-CM

## 2021-11-04 DIAGNOSIS — R0602 Shortness of breath: Secondary | ICD-10-CM

## 2021-11-09 ENCOUNTER — Other Ambulatory Visit: Payer: Self-pay | Admitting: Family Medicine

## 2021-11-21 ENCOUNTER — Other Ambulatory Visit: Payer: Self-pay | Admitting: Family Medicine

## 2021-11-21 DIAGNOSIS — G4733 Obstructive sleep apnea (adult) (pediatric): Secondary | ICD-10-CM

## 2021-11-22 NOTE — Telephone Encounter (Signed)
Last office visit 10/05/2021 for CPE.  Last refilled 09/27/2020 for #90 with 3 refills.  Next Appt: 01/04/2022.

## 2021-11-23 DIAGNOSIS — L900 Lichen sclerosus et atrophicus: Secondary | ICD-10-CM | POA: Diagnosis not present

## 2021-11-24 DIAGNOSIS — G4733 Obstructive sleep apnea (adult) (pediatric): Secondary | ICD-10-CM | POA: Diagnosis not present

## 2021-11-24 DIAGNOSIS — G43019 Migraine without aura, intractable, without status migrainosus: Secondary | ICD-10-CM | POA: Diagnosis not present

## 2021-11-25 ENCOUNTER — Other Ambulatory Visit: Payer: Self-pay | Admitting: Family Medicine

## 2021-11-28 ENCOUNTER — Other Ambulatory Visit: Payer: Self-pay | Admitting: Cardiovascular Disease

## 2021-12-06 ENCOUNTER — Ambulatory Visit (INDEPENDENT_AMBULATORY_CARE_PROVIDER_SITE_OTHER): Payer: Medicare Other | Admitting: Obstetrics

## 2021-12-06 ENCOUNTER — Encounter: Payer: Self-pay | Admitting: Obstetrics

## 2021-12-06 VITALS — BP 122/74 | HR 87 | Resp 16 | Ht 64.0 in | Wt 170.4 lb

## 2021-12-06 DIAGNOSIS — L900 Lichen sclerosus et atrophicus: Secondary | ICD-10-CM | POA: Diagnosis not present

## 2021-12-06 DIAGNOSIS — Z13820 Encounter for screening for osteoporosis: Secondary | ICD-10-CM | POA: Diagnosis not present

## 2021-12-06 DIAGNOSIS — Z1231 Encounter for screening mammogram for malignant neoplasm of breast: Secondary | ICD-10-CM

## 2021-12-06 DIAGNOSIS — E049 Nontoxic goiter, unspecified: Secondary | ICD-10-CM | POA: Diagnosis not present

## 2021-12-06 DIAGNOSIS — Z9189 Other specified personal risk factors, not elsewhere classified: Secondary | ICD-10-CM | POA: Diagnosis not present

## 2021-12-06 DIAGNOSIS — Z01411 Encounter for gynecological examination (general) (routine) with abnormal findings: Secondary | ICD-10-CM | POA: Diagnosis not present

## 2021-12-06 NOTE — Addendum Note (Signed)
Addended by: Heidi Dach on: 12/06/2021 10:28 AM   Modules accepted: Orders

## 2021-12-06 NOTE — Patient Instructions (Signed)
Have a great year! Please call with any concerns. Don't forget to wear your seatbelt everyday! If you are not signed up on MyChart, please ask [place] how to sign up for it!   In a world where you can be anything, please be kind.   Body mass index is 29.25 kg/m.   [place]  A Healthy Lifestyle: Care Instructions Your Care Instructions  A healthy lifestyle can help you feel good, stay at a healthy weight, and have plenty of energy for both work and play. A healthy lifestyle is something you can share with your whole family. A healthy lifestyle also can lower your risk for serious health problems, such as high blood pressure, heart disease, and diabetes. You can follow a few steps listed below to improve your health and the health of your family. Follow-up care is a key part of your treatment and safety. Be sure to make and go to all appointments, and call your doctor if you are having problems. It's also a good idea to know your test results and keep a list of the medicines you take. How can you care for yourself at home? Do not eat too much sugar, fat, or fast foods. You can still have dessert and treats now and then. The goal is moderation. Start small to improve your eating habits. Pay attention to portion sizes, drink less juice and soda pop, and eat more fruits and vegetables. Eat a healthy amount of food. A 3-ounce serving of meat, for example, is about the size of a deck of cards. Fill the rest of your plate with vegetables and whole grains. Limit the amount of soda and sports drinks you have every day. Drink more water when you are thirsty. Eat at least 5 servings of fruits and vegetables every day. It may seem like a lot, but it is not hard to reach this goal. A serving or helping is 1 piece of fruit, 1 cup of vegetables, or 2 cups of leafy, raw vegetables. Have an apple or some carrot sticks as an afternoon snack instead of a candy bar. Try to have fruits and/or vegetables at every  meal. Make exercise part of your daily routine. You may want to start with simple activities, such as walking, bicycling, or slow swimming. Try to be active 30 to 60 minutes every day. You do not need to do all 30 to 60 minutes all at once. For example, you can exercise 3 times a day for 10 or 20 minutes. Moderate exercise is safe for most people, but it is always a good idea to talk to your doctor before starting an exercise program. Keep moving. Mow the lawn, work in the garden, or [employer]. Take the stairs instead of the elevator at work. If you smoke, quit. People who smoke have an increased risk for heart attack, stroke, cancer, and other lung illnesses. Quitting is hard, but there are ways to boost your chance of quitting tobacco for good. Use nicotine gum, patches, or lozenges. Ask your doctor about stop-smoking programs and medicines. Keep trying. In addition to reducing your risk of diseases in the future, you will notice some benefits soon after you stop using tobacco. If you have shortness of breath or asthma symptoms, they will likely get better within a few weeks after you quit. Limit how much alcohol you drink. Moderate amounts of alcohol (up to 2 drinks a day for men, 1 drink a day for women) are okay. But drinking too much  can lead to liver problems, high blood pressure, and other health problems. Family health If you have a family, there are many things you can do together to improve your health. Eat meals together as a family as often as possible. Eat healthy foods. This includes fruits, vegetables, lean meats and dairy, and whole grains. Include your family in your fitness plan. Most people think of activities such as jogging or tennis as the way to fitness, but there are many ways you and your family can be more active. Anything that makes you breathe hard and gets your heart pumping is exercise. Here are some tips: Walk to do errands or to take your child to school or the  bus. Go for a family bike ride after dinner instead of watching TV. Care instructions adapted under license by your healthcare professional. This care instruction is for use with your licensed healthcare professional. If you have questions about a medical condition or this instruction, always ask your healthcare professional. [place] any warranty or liability for your use of this information.

## 2021-12-06 NOTE — Progress Notes (Addendum)
Chief Complaint  Patient presents with   Annual Exam    She had a hysterectomy. She is due for a mammogram. She has a history of lichen sclerosis and was recently evaluated at dermatologist.   Patient Brandi Mahoney is an 66 y.o. year old [id number] s/p abdominal hysterectomy who presents for annual. Patient is not exercising regularly. Patient does not perform SBE. There is family history of breast and colon cancer in family.  Patient is taking MVI. Patient reports vasomotor symptoms, no longer on hormones. Reports vaginal dryness. Patient is sexually active with no problems. New rx for med by derm for lichen sclerosis.   Patients pronouns are she her.  PCP: Owens Loffler, MD  Review of Systems  Constitutional:  Negative for activity change, appetite change, chills, diaphoresis, fatigue, fever and unexpected weight change.  HENT:  Positive for sneezing. Negative for congestion, dental problem, drooling, ear discharge, ear pain, facial swelling, hearing loss, mouth sores, nosebleeds, postnasal drip, rhinorrhea, sinus pressure, sinus pain, sore throat, tinnitus, trouble swallowing and voice change.   Eyes:  Negative for photophobia, pain, discharge, redness, itching and visual disturbance.  Respiratory:  Negative for apnea, cough, choking, chest tightness, shortness of breath, wheezing and stridor.   Cardiovascular:  Negative for chest pain, palpitations and leg swelling.  Gastrointestinal:  Negative for abdominal distention, abdominal pain, anal bleeding, blood in stool, constipation, diarrhea, nausea, rectal pain and vomiting.  Endocrine: Negative for cold intolerance, heat intolerance, polydipsia, polyphagia and polyuria.  Genitourinary:  Positive for dyspareunia. Negative for decreased urine volume, difficulty urinating, dysuria, enuresis, flank pain, frequency, genital sores, hematuria, menstrual problem, pelvic pain, urgency, vaginal bleeding, vaginal discharge and vaginal pain.   Musculoskeletal:  Negative for arthralgias, back pain, gait problem, joint swelling, myalgias, neck pain and neck stiffness.  Skin:  Negative for color change, pallor, rash and wound.  Allergic/Immunologic: Negative for environmental allergies, food allergies and immunocompromised state.  Neurological:  Negative for dizziness, tremors, seizures, syncope, facial asymmetry, speech difficulty, weakness, light-headedness, numbness and headaches.  Hematological:  Negative for adenopathy. Does not bruise/bleed easily.  Psychiatric/Behavioral:  Negative for agitation, behavioral problems, confusion, decreased concentration, dysphoric mood, hallucinations, self-injury, sleep disturbance and suicidal ideas. The patient is not nervous/anxious and is not hyperactive.     Menstrual History Patient denies postmenopausal bleeding or cramping S/p hysterectomy for endometriosis Age of menopausal symptoms: early 72s  Denies history of abnormal paps or pelvic infections.  Denies domestic violence or sexual abuse  Health Maintenance  Topic Date Due   OPHTHALMOLOGY EXAM  Never done   TETANUS/TDAP  05/25/2019   FOOT EXAM  01/27/2021   DEXA SCAN  Never done   INFLUENZA VACCINE  01/17/2022   MAMMOGRAM  03/23/2022   HEMOGLOBIN A1C  03/30/2022   COLONOSCOPY (Pts 45-14yr Insurance coverage will need to be confirmed)  01/03/2023   Pneumonia Vaccine 66 Years old  Completed   COVID-19 Vaccine  Completed   Hepatitis C Screening  Completed   HIV Screening  Completed   Zoster Vaccines- Shingrix  Completed   HPV VACCINES  Aged Out   URINE MICROALBUMIN  Discontinued    Past Medical History:  Diagnosis Date   CAD (coronary artery disease)    a. 01/2010 Cath: mild atherosclerosis in the mid LCX after takeoff of OM1, mild diff LAD dzs;  b. 07/2013 MV: EF 67%, no ischemia.   GAD (generalized anxiety disorder)    History of kidney stones    HLD (hyperlipidemia)    Hypothyroidism  Major depressive disorder,  recurrent episode, in full remission ([place]) 09/08/2008   Qualifier: Diagnosis of  By: Lorelei Pont MD, [place]     Migraine headache    Myocarditis ([place])    a. 01/2010 presented w/ c/p and elev trop-->Cath: mild atherosclerosis in the mid LCX after takeoff of OM1, mild diff LAD dzs;  b. 03/2010 Echo: EF 60-65%, DD, mild LVH;  c. 07/2011 Echo: EF 55-60%, mild LVH, no rwma.   Sleep apnea    has c-pap.  wears off and on - not lately  using  per reported 07-29-2020   Type 2 diabetes, diet controlled [facility])    Past Surgical History:  Procedure Laterality Date   APPENDECTOMY  1974   BREAST SURGERY Right 1988   fatty tumore from breast   CARDIAC CATHETERIZATION  8/11   no significant -[facility] Dr Rockey Situ,   CHOLECYSTECTOMY  2002   COLONOSCOPY  2017   CYST EXCISION     ovarian cyst 1994   NEPHROLITHOTOMY Right 08/20/2014   Procedure: RIGHT PERCUTANEOUS NEPHROLITHOTOMY ;  Surgeon: Malka So, MD;  Location: [facility];  Service: Urology;  Laterality: Right;   POLYPECTOMY     RECTAL EXAM UNDER ANESTHESIA N/A 08/02/2020   Procedure: Darci Needle EXAM UNDER ANESTHESIA INJECTION OF PERIANAL BOTOX;  Surgeon: Ileana Roup, MD;  Location: [place];  Service: General;  Laterality: N/A;   TOTAL ABDOMINAL HYSTERECTOMY W/ BILATERAL SALPINGOOPHORECTOMY  2000   no ovaries, uterus, or cervix.   Family History  Problem Relation Age of Onset   Coronary artery disease Mother    Diabetes Mother    ALS Mother    Hyperlipidemia Father    Breast cancer Paternal Grandmother    Lung cancer Paternal Grandfather        CAD   Other Daughter        gluten   Down syndrome Son    Colon cancer Maternal Grandfather        dx early 26's    Breast cancer Sister 10   Esophageal cancer Neg Hx    Liver cancer Neg Hx    Pancreatic cancer Neg Hx    Rectal cancer Neg Hx    Stomach cancer Neg Hx    Social History   Socioeconomic History   Marital status: Married    Spouse name: Nicole Kindred   Number of children:  3   Years of education: Not on file   Highest education level: Not on file  Occupational History   Occupation: retired    Comment: [employer]  Tobacco Use   Smoking status: Former    Packs/day: 0.35    Years: 35.00    Total pack years: 12.25    Types: Cigarettes    Quit date: 09/25/2008    Years since quitting: 13.2   Smokeless tobacco: Never   Tobacco comments:    quit in 2007-2008, social smoker  Vaping Use   Vaping Use: Never used  Substance and Sexual Activity   Alcohol use: Not Currently   Drug use: No   Sexual activity: Not Currently  Other Topics Concern   Not on file  Social History Narrative   Lives in [place] w/ husband.  No reg exercise.   Social Determinants of Health   Financial Resource Strain: Not on file  Food Insecurity: Not on file  Transportation Needs: Not on file  Physical Activity: Not on file  Stress: Not on file  Social Connections: Not on file  Intimate Partner  Violence: Not on file     Medicine list and allergies reviewed and updated.    Objective:  BP 122/74   Pulse 87   Resp 16   Ht '5\' 4"'$  (1.626 m)   Wt 170 lb 6.4 oz (77.3 kg)   SpO2 95%   BMI 29.25 kg/m  Flowsheet Row Office Visit from 12/06/2021 in [facility]  PHQ-2 Total Score 2       Physical Exam Vitals and nursing note reviewed. Exam conducted with a chaperone present.  Constitutional:      General: She is not in acute distress.    Appearance: Normal appearance. She is not ill-appearing.  HENT:     Head: Normocephalic and atraumatic.     Mouth/Throat:     Mouth: Mucous membranes are moist.     Pharynx: No oropharyngeal exudate.  Eyes:     Extraocular Movements: Extraocular movements intact.  Neck:     Thyroid: Thyromegaly (?) present.  Cardiovascular:     Rate and Rhythm: Normal rate and regular rhythm.     Heart sounds: Normal heart sounds.  Pulmonary:     Effort: Pulmonary effort is normal.     Breath sounds: Normal breath sounds.  Chest:      Chest wall: No mass or tenderness.  Breasts:    Breasts are symmetrical.     Right: Normal. No mass, nipple discharge, skin change or tenderness.     Left: Normal. No mass, nipple discharge, skin change or tenderness.  Abdominal:     General: A surgical scar is present. There is no distension.     Palpations: There is no mass.     Tenderness: There is no abdominal tenderness. There is no guarding or rebound.     Hernia: No hernia is present.  Genitourinary:    Exam position: Lithotomy position.     Labia:        Right: No rash, tenderness or lesion.        Left: No rash, tenderness or lesion.      Urethra: No prolapse or urethral lesion.     Vagina: Normal. No vaginal discharge, tenderness, lesions or prolapsed vaginal walls.     Cervix: No discharge, lesion or cervical bleeding.     Uterus: Normal. Not enlarged, not tender and no uterine prolapse.      Adnexa:        Right: No tenderness or fullness.         Left: No tenderness or fullness.       Rectum: Normal. No tenderness or external hemorrhoid. Normal anal tone.       Comments: External genitalia - atrophic consistent with lichen sclerosis with loss of architecture whitening changes; reddened inflamed appear tissue perineal  Urethral meatus - no prolapse, no lesions noted  Urethra - no tenderness or scarring noted, no masses noted  Bladder - normal, no masses or tenderness noted Vagina -  atrophic with decreased rugae, no discharge, no lesions noted, pelvic relaxation Cervix - small with no lesions noted, atrophic  Uterus - small, nontender, mobile with adequate pelvic support  Adnexa - no masses palpated, no tenderness Rectal - normal sphincter tone, no hemorrhoids or masses noted Musculoskeletal:        General: Normal range of motion.     Cervical back: Normal range of motion. No tenderness.     Right lower leg: No edema.     Left lower leg: No edema.  Lymphadenopathy:  Cervical: No cervical adenopathy.     Upper  Body:     Right upper body: No axillary adenopathy.     Left upper body: No axillary adenopathy.     Lower Body: No right inguinal adenopathy. No left inguinal adenopathy.  Skin:    General: Skin is warm and dry.     Findings: Rash present. Rash is papular.  Neurological:     General: No focal deficit present.     Mental Status: She is alert and oriented to person, place, and time. Mental status is at baseline.     Coordination: Coordination normal.     Deep Tendon Reflexes: Reflexes normal.  Psychiatric:        Mood and Affect: Mood normal.        Behavior: Behavior normal.        Thought Content: Thought content normal.        Judgment: Judgment normal.     Assessment/Plan:  Encounter for gynecological examination with abnormal finding  Lichen sclerosus  Encounter for screening mammogram for malignant neoplasm of breast - Plan: MM 3D SCREEN BREAST BILATERAL  Screening for osteoporosis - Plan: DG Bone Density  At risk for cardiovascular event  No pap needed s/p hysterectomy for benign reasons.  Breast exam recommended. Mammogram ordered Daily multivitamin recommended. Calcium and Vitamin D recommended. Colonoscopy UTD Bone density ordered FRAX score is calculated as : 11 % risk of Major osteoporotic fracture /1.7 % risk of hip fracture Wellness labs per PCP and cards. We discussed her increased ASCVD, pt is rx statin and daily ASA  The 10-year ASCVD risk score (Arnett DK, et al., 2019) is: 8.3%   Values used to calculate the score:     Age: 75 years     Sex: Female     Is Non-Hispanic African American: No     Diabetic: Yes     Tobacco smoker: No     Systolic Blood Pressure: 680 mmHg     Is BP treated: No     HDL Cholesterol: 51.5 mg/dL     Total Cholesterol: 141 mg/dL Concern for enlarged thyroid on exam will order [place]  Return in about 1 year (around 12/07/2022) for annual/well woman.

## 2021-12-13 DIAGNOSIS — Z79899 Other long term (current) drug therapy: Secondary | ICD-10-CM | POA: Diagnosis not present

## 2021-12-13 DIAGNOSIS — L900 Lichen sclerosus et atrophicus: Secondary | ICD-10-CM | POA: Diagnosis not present

## 2021-12-27 ENCOUNTER — Other Ambulatory Visit (INDEPENDENT_AMBULATORY_CARE_PROVIDER_SITE_OTHER): Payer: Medicare Other

## 2021-12-27 DIAGNOSIS — R7989 Other specified abnormal findings of blood chemistry: Secondary | ICD-10-CM

## 2021-12-27 DIAGNOSIS — E039 Hypothyroidism, unspecified: Secondary | ICD-10-CM

## 2021-12-27 DIAGNOSIS — E119 Type 2 diabetes mellitus without complications: Secondary | ICD-10-CM

## 2021-12-27 LAB — HEPATIC FUNCTION PANEL
ALT: 37 U/L — ABNORMAL HIGH (ref 0–35)
AST: 36 U/L (ref 0–37)
Albumin: 4.7 g/dL (ref 3.5–5.2)
Alkaline Phosphatase: 56 U/L (ref 39–117)
Bilirubin, Direct: 0.1 mg/dL (ref 0.0–0.3)
Total Bilirubin: 0.6 mg/dL (ref 0.2–1.2)
Total Protein: 6.9 g/dL (ref 6.0–8.3)

## 2021-12-27 LAB — T4, FREE: Free T4: 0.81 ng/dL (ref 0.60–1.60)

## 2021-12-27 LAB — HEMOGLOBIN A1C: Hgb A1c MFr Bld: 7.2 % — ABNORMAL HIGH (ref 4.6–6.5)

## 2021-12-27 LAB — TSH: TSH: 3.56 u[IU]/mL (ref 0.35–5.50)

## 2021-12-27 LAB — T3, FREE: T3, Free: 2.8 pg/mL (ref 2.3–4.2)

## 2021-12-28 ENCOUNTER — Other Ambulatory Visit: Payer: Medicare Other

## 2021-12-28 LAB — HEPATITIS C ANTIBODY: Hepatitis C Ab: NONREACTIVE

## 2021-12-28 LAB — HEPATITIS B SURFACE ANTIGEN: Hepatitis B Surface Ag: NONREACTIVE

## 2021-12-28 LAB — HEPATITIS B SURFACE ANTIBODY,QUALITATIVE: Hep B S Ab: NONREACTIVE

## 2021-12-28 LAB — HEPATITIS B CORE ANTIBODY, IGM: Hep B C IgM: NONREACTIVE

## 2022-01-04 ENCOUNTER — Ambulatory Visit: Payer: Medicare Other | Admitting: Family Medicine

## 2022-01-11 ENCOUNTER — Other Ambulatory Visit: Payer: Self-pay | Admitting: Obstetrics

## 2022-01-11 DIAGNOSIS — N644 Mastodynia: Secondary | ICD-10-CM

## 2022-01-11 DIAGNOSIS — L900 Lichen sclerosus et atrophicus: Secondary | ICD-10-CM | POA: Diagnosis not present

## 2022-01-11 DIAGNOSIS — L309 Dermatitis, unspecified: Secondary | ICD-10-CM | POA: Diagnosis not present

## 2022-01-18 DIAGNOSIS — Z79899 Other long term (current) drug therapy: Secondary | ICD-10-CM | POA: Diagnosis not present

## 2022-01-18 DIAGNOSIS — L900 Lichen sclerosus et atrophicus: Secondary | ICD-10-CM | POA: Diagnosis not present

## 2022-01-19 ENCOUNTER — Telehealth: Payer: Self-pay | Admitting: Family Medicine

## 2022-01-19 MED ORDER — AZITHROMYCIN 250 MG PO TABS: ORAL_TABLET | ORAL | 0 refills | Status: AC

## 2022-01-19 NOTE — Addendum Note (Signed)
Addended by: Carter Kitten on: 01/19/2022 05:24 PM   Modules accepted: Orders

## 2022-01-19 NOTE — Telephone Encounter (Signed)
Z-PAK sent to [facility] as instructed by Dr. Lorelei Pont.  Left message for Augustin Coupe that prescription has been sent to [facility].

## 2022-01-19 NOTE — Telephone Encounter (Signed)
Patient called in stating her son was diagnosed with strep throat and she is leaving to go to [place]. She was wondering if Dr. Lorelei Pont could call in an antibiotic for her so she won't get anybody else sick while away. Please advise. Thank you!

## 2022-01-25 ENCOUNTER — Other Ambulatory Visit: Payer: Self-pay | Admitting: Licensed Practical Nurse

## 2022-01-25 DIAGNOSIS — N644 Mastodynia: Secondary | ICD-10-CM

## 2022-01-30 ENCOUNTER — Encounter: Payer: Self-pay | Admitting: Obstetrics

## 2022-02-06 DIAGNOSIS — D7389 Other diseases of spleen: Secondary | ICD-10-CM | POA: Diagnosis not present

## 2022-02-06 DIAGNOSIS — R160 Hepatomegaly, not elsewhere classified: Secondary | ICD-10-CM | POA: Diagnosis not present

## 2022-02-06 DIAGNOSIS — K760 Fatty (change of) liver, not elsewhere classified: Secondary | ICD-10-CM | POA: Diagnosis not present

## 2022-02-06 DIAGNOSIS — R1084 Generalized abdominal pain: Secondary | ICD-10-CM | POA: Diagnosis not present

## 2022-02-06 DIAGNOSIS — D734 Cyst of spleen: Secondary | ICD-10-CM | POA: Diagnosis not present

## 2022-02-09 ENCOUNTER — Ambulatory Visit (INDEPENDENT_AMBULATORY_CARE_PROVIDER_SITE_OTHER): Payer: Medicare Other | Admitting: Family Medicine

## 2022-02-09 ENCOUNTER — Encounter: Payer: Self-pay | Admitting: Family Medicine

## 2022-02-09 VITALS — BP 120/60 | HR 79 | Temp 98.9°F | Ht 64.0 in | Wt 168.4 lb

## 2022-02-09 DIAGNOSIS — G4700 Insomnia, unspecified: Secondary | ICD-10-CM | POA: Diagnosis not present

## 2022-02-09 DIAGNOSIS — R232 Flushing: Secondary | ICD-10-CM | POA: Diagnosis not present

## 2022-02-09 DIAGNOSIS — M5137 Other intervertebral disc degeneration, lumbosacral region: Secondary | ICD-10-CM | POA: Diagnosis not present

## 2022-02-09 DIAGNOSIS — G478 Other sleep disorders: Secondary | ICD-10-CM | POA: Diagnosis not present

## 2022-02-09 DIAGNOSIS — E049 Nontoxic goiter, unspecified: Secondary | ICD-10-CM

## 2022-02-09 MED ORDER — CELECOXIB 200 MG PO CAPS: 200.0000 mg | ORAL_CAPSULE | Freq: Every day | ORAL | 2 refills | Status: DC

## 2022-02-09 NOTE — Progress Notes (Signed)
Rashad Obeid T. Destyn Parfitt, MD, [place] at [facility] [place], [address]  Phone: [phone]  FAX: [phone]  Brandi Mahoney - 66 y.o. female  MRN [id number]  Date of Birth: 02-29-1956  Date: 02/09/2022  PCP: Owens Loffler, MD  Referral: Owens Loffler, MD  Chief Complaint  Patient presents with   Follow-up    Urology Visit-No Kidney Stone-Discuss other options   Insomnia    Wants hormone levels checked   Enlarged Thyroid    Per GYN   Subjective:   Brandi Mahoney is a 66 y.o. very pleasant female patient with Body mass index is 28.9 kg/m. who presents with the following:  Patient presents with a few issues this afternoon.  R sided back pain: Patient presents with some right-sided back pain.  Initially, she felt like this was possibly a renal stone, so she went to urology.  They did do a CT urogram, and after her renal protocol CT there was no evidence of nephrolithiasis.  There was some evidence of diffuse fatty liver as well as some multilevel degenerative disc disease seen on the CT.  I have reviewed the report, though the images are not available for my review. Went to [place] - woke up out of a dead sleep.    - on the R side and wraps around to the front.  She does not describe any radiating pain, numbness, tingling, or weakness in the lower extremities.  Hormone levels for insomnia - She is not sleeping well, and she wonders if this has to do with a hormonal imbalance, and her husband is worried about it. Estrogen, progesterone, testosterone, LH, FSH  She had her gynecological checkup, and her new gynecologist was worried that she may have an enlarged thyroid.  It looks as if there is already ordered a thyroid ultrasound, but this has not been scheduled, she wanted to discuss this with me.  Review of Systems is noted in the HPI, as appropriate  Objective:   BP 120/60   Pulse 79   Temp 98.9 F  (37.2 C) (Oral)   Ht '5\' 4"'$  (1.626 m)   Wt 168 lb 6 oz (76.4 kg)   SpO2 93%   BMI 28.90 kg/m   GEN: No acute distress; alert,appropriate. PULM: Breathing comfortably in no respiratory distress PSYCH: Normally interactive.  Neck: I do not appreciate any thyromegaly She has full range of motion at the lumbar spine in all directions without any impairment.  Minimal tenderness in the erector spinae complex.  There is no pain with decompression of the ribs throughout.  Negative barrel sign. Lower extremities are entirely neurovascularly intact. ABD: S, mild tenderness with deep right upper quadrant palpation, ND, + BS, No rebound, No HSM   Laboratory and Imaging Data:  Assessment and Plan:     ICD-10-CM   1. DDD (degenerative disc disease), lumbosacral  M51.37 Ambulatory referral to Physical Therapy    2. Enlarged thyroid  E04.9 US THYROID    3. Insomnia disorder, with other sleep disorder, recurrent  G47.00 Progesterone   G47.8 Estrogens, Total    Testos,Total,Free and SHBG (Female)    FSH/LH    4. Hot flashes  R23.2 Progesterone    Estrogens, Total    Testos,Total,Free and SHBG (Female)    FSH/LH     CT proven diffuse multilevel degenerative disc disease, suspect that this is referred pain on the right side secondary to degenerative disc disease.  There  are no red flags currently, there are no radicular symptoms or neurological impairment.  Suggest round of formal physical therapy.  Scheduled Celebrex for now.  Question of enlarged thyroid, I do not really appreciate this today, but given the concerns of her other physician I think that a thyroid ultrasound is reasonable.  Insomnia of unclear origin, with the concern of possible hormonal difficulties as a primary driver, I think it is probably reasonable to rule this out, but she is a postmenopausal female.  Medication Management during today's office visit: Meds ordered this encounter  Medications   celecoxib (CELEBREX) 200  MG capsule    Sig: Take 1 capsule (200 mg total) by mouth daily.    Dispense:  30 capsule    Refill:  2   There are no discontinued medications.  Orders placed today for conditions managed today: Orders Placed This Encounter  Procedures   US THYROID   Progesterone   Estrogens, Total   Testos,Total,Free and SHBG (Female)   FSH/LH   Ambulatory referral to Physical Therapy    Disposition: No follow-ups on file.  Dr[employer] One speech-to-text software was used for transcription in this dictation.  Possible transcriptional errors can occur using Editor, commissioning.   Signed,  Maud Deed. Salli Bodin, MD   Outpatient Encounter Medications as of 02/09/2022  Medication Sig   acitretin (SORIATANE) 10 MG capsule Take 10 mg by mouth daily.   aspirin EC 81 MG tablet Take 81 mg by mouth daily.   celecoxib (CELEBREX) 200 MG capsule Take 1 capsule (200 mg total) by mouth daily.   clobetasol cream (TEMOVATE) 0.05 % APPLY TWO TIMES DAILY FOR TWO WEEKS THEN USE AS NEEDED FOR LICHEN SCLEROSIS   cyclobenzaprine (FLEXERIL) 10 MG tablet TAKE 1 TABLET (10 MG TOTAL) BY MOUTH 2 (TWO) TIMES DAILY AS NEEDED (PAIN).   fenofibrate 160 MG tablet TAKE ONE TABLET BY MOUTH DAILY   gabapentin (NEURONTIN) 300 MG capsule TAKE ONE CAPSULE BY MOUTH AT BEDTIME   glucose blood (ACCU-CHEK AVIVA) test strip Use to check blood sugar once daily   Lactobacillus-Inulin (CULTURELLE ADULT ULT BALANCE PO)    levothyroxine (SYNTHROID) 75 MCG tablet TAKE ONE TABLET BY MOUTH EVERY MORNING BEFORE BREAKFAST   Magnesium 250 MG TABS Take 1 tablet by mouth daily.   metoprolol succinate (TOPROL-XL) 50 MG 24 hr tablet TAKE ONE TABLET BY MOUTH DAILY WITH OR IMMEDIATELY FOLLOWING A MEAL   Multiple Vitamins-Minerals (ONE-A-DAY WOMENS PETITES PO) Take 2 tablets by mouth daily.   nitroGLYCERIN (NITROSTAT) 0.4 MG SL tablet 1 tablet under tongue at onset of chest pain; you may repeat every 5 minutes for up to 3 doses.   nystatin (MYCOSTATIN)  100000 UNIT/ML suspension Take 5 mLs (500,000 Units total) by mouth 4 (four) times daily.   pantoprazole (PROTONIX) 40 MG tablet TAKE ONE TABLET BY MOUTH DAILY   pravastatin (PRAVACHOL) 40 MG tablet TAKE ONE TABLET BY MOUTH AT BEDTIME   sertraline (ZOLOFT) 50 MG tablet TAKE ONE TABLET BY MOUTH DAILY   No facility-administered encounter medications on file as of 02/09/2022.

## 2022-02-13 ENCOUNTER — Telehealth: Payer: Self-pay | Admitting: Family Medicine

## 2022-02-13 ENCOUNTER — Ambulatory Visit
Admission: RE | Admit: 2022-02-13 | Discharge: 2022-02-13 | Disposition: A | Discharge status: Home / Self Care | Payer: Medicare Other | Source: Ambulatory Visit | Attending: Family Medicine | Admitting: Family Medicine

## 2022-02-13 DIAGNOSIS — E049 Nontoxic goiter, unspecified: Secondary | ICD-10-CM | POA: Diagnosis not present

## 2022-02-13 DIAGNOSIS — E010 Iodine-deficiency related diffuse (endemic) goiter: Secondary | ICD-10-CM | POA: Diagnosis not present

## 2022-02-13 NOTE — Telephone Encounter (Signed)
No.  Since she just had a CT scan of her back, I am not sure anything additional would need to be done, anyway.

## 2022-02-13 NOTE — Telephone Encounter (Signed)
[name] notified as instructed by telephone.  States understanding.

## 2022-02-13 NOTE — Telephone Encounter (Signed)
Patient called, has an ultrasound today at 1:45 and wanted to see if they could scan her back while she was there  Her PT appointment isn't until 10.2.23  Informed the patient that she could call and ask, however without seeing the PT until 10.2.23, more than likely they would not be able to

## 2022-02-14 LAB — PROGESTERONE: Progesterone: <0.5 ng/mL

## 2022-02-14 LAB — TESTOS,TOTAL,FREE AND SHBG (FEMALE)
Free Testosterone: 1.6 pg/mL (ref 0.1–6.4)
Sex Hormone Binding: 36 nmol/L (ref 14–73)
Testosterone, Total, LC-MS-MS: 13 ng/dL (ref 2–45)

## 2022-02-14 LAB — FSH/LH
FSH: 73.6 m[IU]/mL
LH: 26.8 m[IU]/mL

## 2022-02-14 LAB — ESTROGENS, TOTAL: Estrogen: 50 pg/mL

## 2022-02-17 ENCOUNTER — Other Ambulatory Visit: Payer: Self-pay | Admitting: Family Medicine

## 2022-02-17 ENCOUNTER — Ambulatory Visit
Admission: RE | Admit: 2022-02-17 | Discharge: 2022-02-17 | Disposition: A | Discharge status: Home / Self Care | Payer: Medicare Other | Source: Ambulatory Visit | Attending: Licensed Practical Nurse | Admitting: Licensed Practical Nurse

## 2022-02-17 DIAGNOSIS — N644 Mastodynia: Secondary | ICD-10-CM | POA: Diagnosis not present

## 2022-03-07 ENCOUNTER — Ambulatory Visit: Payer: Medicare Other | Admitting: Physical Therapy

## 2022-03-14 ENCOUNTER — Encounter: Payer: Medicare Other | Admitting: Physical Therapy

## 2022-03-16 ENCOUNTER — Telehealth: Payer: Self-pay | Admitting: Physical Therapy

## 2022-03-16 NOTE — Telephone Encounter (Signed)
Called pt to inform her about increased availability and to see whether she wanted to come in earlier for eval. Did not get a hold of pt so left VM.

## 2022-03-20 ENCOUNTER — Ambulatory Visit: Payer: Medicare Other | Admitting: Physician Assistant

## 2022-03-20 ENCOUNTER — Encounter: Payer: Medicare Other | Admitting: Physical Therapy

## 2022-03-20 ENCOUNTER — Ambulatory Visit: Payer: Medicare Other | Attending: Family Medicine | Admitting: Physical Therapy

## 2022-03-20 ENCOUNTER — Other Ambulatory Visit: Payer: Self-pay

## 2022-03-20 DIAGNOSIS — M542 Cervicalgia: Secondary | ICD-10-CM | POA: Insufficient documentation

## 2022-03-20 DIAGNOSIS — M5459 Other low back pain: Secondary | ICD-10-CM | POA: Diagnosis not present

## 2022-03-20 NOTE — Therapy (Signed)
OUTPATIENT PHYSICAL THERAPY LOWER EXTREMITY EVALUATION   Patient Name: Brandi Mahoney MRN: [id number] DOB:1956/03/25, 66 y.o., female Today's Date: 03/20/2022   PT End of Session - 03/20/22 1331     Visit Number 1    Number of Visits 20    Date for PT Re-Evaluation 05/29/22    Authorization Type UHC Medicare    PT Start Time 0805    PT Stop Time 0845    PT Time Calculation (min) 40 min    Activity Tolerance Patient tolerated treatment well    Behavior During Therapy [facility] for tasks assessed/performed             Past Medical History:  Diagnosis Date   CAD (coronary artery disease)    a. 01/2010 Cath: mild atherosclerosis in the mid LCX after takeoff of OM1, mild diff LAD dzs;  b. 07/2013 MV: EF 67%, no ischemia.   GAD (generalized anxiety disorder)    History of kidney stones    HLD (hyperlipidemia)    Hypothyroidism    Major depressive disorder, recurrent episode, in full remission ([place]) 09/08/2008   Qualifier: Diagnosis of  By: Lorelei Pont MD, [place]     Migraine headache    Myocarditis ([place])    a. 01/2010 presented w/ c/p and elev trop-->Cath: mild atherosclerosis in the mid LCX after takeoff of OM1, mild diff LAD dzs;  b. 03/2010 Echo: EF 60-65%, DD, mild LVH;  c. 07/2011 Echo: EF 55-60%, mild LVH, no rwma.   Sleep apnea    has c-pap.  wears off and on - not lately  using  per reported 07-29-2020   Type 2 diabetes, diet controlled [facility])    Past Surgical History:  Procedure Laterality Date   APPENDECTOMY  1974   BREAST SURGERY Right 1988   fatty tumore from breast   CARDIAC CATHETERIZATION  8/11   no significant -[facility]- Dr Rockey Situ,   CHOLECYSTECTOMY  2002   COLONOSCOPY  2017   CYST EXCISION     ovarian cyst 1994   NEPHROLITHOTOMY Right 08/20/2014   Procedure: RIGHT PERCUTANEOUS NEPHROLITHOTOMY ;  Surgeon: Malka So, MD;  Location: [facility];  Service: Urology;  Laterality: Right;   POLYPECTOMY     RECTAL EXAM UNDER ANESTHESIA N/A 08/02/2020   Procedure: Darci Needle  EXAM UNDER ANESTHESIA INJECTION OF PERIANAL BOTOX;  Surgeon: Ileana Roup, MD;  Location: [place];  Service: General;  Laterality: N/A;   TOTAL ABDOMINAL HYSTERECTOMY W/ BILATERAL SALPINGOOPHORECTOMY  2000   no ovaries, uterus, or cervix.   Patient Active Problem List   Diagnosis Date Noted   Lichen sclerosus 40/01/6760   Coronary artery disease involving native coronary artery of native heart with angina pectoris (HCC)    Type 2 diabetes, diet controlled (HCC)    GAD (generalized anxiety disorder)    Obstructive sleep apnea 09/25/2013   Tobacco abuse, in remission 09/19/2013   IBS (irritable bowel syndrome) 09/26/2012   Mixed hyperlipidemia 04/01/2010   Viral myocarditis, 01/2010 01/27/2010   HYPERTRIGLYCERIDEMIA 07/21/2009   Hypothyroidism in adult 09/08/2008   Major depressive disorder, recurrent episode, in full remission ([place]) 09/08/2008   ASTHMA 09/08/2008    PCP: Dr. Owens Loffler   REFERRING PROVIDER: Dr. Frederico Hamman Copland   REFERRING DIAG: M51.37 (ICD-10-CM) - DDD (degenerative disc disease), lumbosacral  THERAPY DIAG:  Cervicalgia  Other low back pain  Rationale for Evaluation and Treatment Rehabilitation  ONSET DATE: 03/20/2016  SUBJECTIVE:   SUBJECTIVE STATEMENT: See pertinent information   PERTINENT HISTORY:  Pt reports having a bad back forever and she went to chiropractor to get examined. He explained that he thought it was a bulging discs and told her not to do activity. She also describes having neck pain and limited ROM especially with most of pain on right side. She thinks this might have happened when falling ten years ago and landing on her face.   PAIN:  Are you having pain? Yes: NPRS scale: 4-5/10 (low back pain), 6-7/10 for neck  Pain location: Right upper trap and SCM, right paraspinal  Pain description: Achy  Aggravating factors: Standing  Relieving factors: Sitting down and laying down and getting off feet    PRECAUTIONS: None  WEIGHT BEARING RESTRICTIONS No  FALLS:  Has patient fallen in last 6 months? No  LIVING ENVIRONMENT: Lives with: lives with their spouse and lives with their son Lives in: House/apartment Stairs: Yes: External: 1 steps; none Has following equipment at home: None  OCCUPATION: Retired   PLOF: Independent  PATIENT GOALS More movement, less pain    OBJECTIVE:              VITALS: BP 129/59 HR 70 Sp02 95  DIAGNOSTIC FINDINGS: None listed   PATIENT SURVEYS:  FOTO 53/100 with target of 37   COGNITION:  Overall cognitive status: Within functional limits for tasks assessed     SENSATION: WFL  MUSCLE LENGTH: Hamstrings: Right NT deg; Left NT  deg Thomas test: Right NT deg; Left NT deg  POSTURE: rounded shoulders and forward head  PALPATION: Right paraspinal   LOWER EXTREMITY ROM:  Active ROM Right eval Left eval  Hip flexion    Hip extension    Hip abduction    Hip adduction    Hip internal rotation    Hip external rotation    Knee flexion    Knee extension    Ankle dorsiflexion    Ankle plantarflexion    Ankle inversion    Ankle eversion     (Blank rows = not tested)                                                                                       Cervical                                                                                                    Norms                               AROM                     PROM  Flex    45                                  50                                                                            Ext      45                                  45                       Lat Side Bend R/L 45/45                            20/25                           20/25                        Rotation       R/L     60/60                           20/20                           45/45     Active ROM Right eval Left eval  Shoulder flexion 180 180  Shoulder  extension 60 60  Shoulder abduction 180  180  Shoulder adduction    Shoulder internal rotation 70 70  Shoulder external rotation 90 90  Elbow flexion 150 150  Elbow extension 60 60  Wrist flexion 80 80  Wrist extension 70 70  Wrist ulnar deviation 30 30  Wrist radial deviation 20 20  Wrist pronation    Wrist supination    (Blank rows = not tested)         UPPER AND LOWER EXTREMITY MMT:                         MMT Right  eval  Left eval  Shoulder flexion 5/5 5/5  Shoulder extension    Shoulder abduction 5/5 5/5  Shoulder adduction    Shoulder internal rotation    Shoulder external rotation    Elbow flexion 5/5 5/5  Elbow extension 5/5 5/5  Wrist flexion 5/5 5/5  Wrist extension 5/5 5/5  Wrist ulnar deviation    Wrist radial deviation    Wrist pronation    Wrist supination      MMT Right eval Left eval  Hip flexion    Hip extension    Hip abduction    Hip adduction    Hip internal rotation    Hip external rotation    Knee flexion    Knee extension    Ankle dorsiflexion    Ankle plantarflexion    Ankle inversion  Ankle eversion     (Blank rows = not tested)  LOWER EXTREMITY SPECIAL TESTS:   Deferred, will complete next session  Hip special tests: Saralyn Pilar (FABER) test: NT , Trendelenburg test: NT, Thomas test: NT, Ober's test: negative, Craig's test: NT, Ely's test: NT, and SI compression test: NT   FUNCTIONAL TESTS:  5 times sit to stand: NT  30 seconds chair stand test: NT  6 minute walk test: NT  10 meter walk test: NT   GAIT: Distance walked: 50 ft  Assistive device utilized: None Level of assistance: Complete Independence Comments: No gait abnormalities noted     TODAY'S TREATMENT: Upper Trap Stretch 3 x 30 sec  SCM Stretch 3 x 30 sec    PATIENT EDUCATION:  Education details: form and technique for appropriate exercise and explanation about underlying deficits  Person educated: Patient Education method: Explanation,  Demonstration, Tactile cues, Verbal cues, and Handouts Education comprehension: verbalized understanding, returned demonstration, verbal cues required, and tactile cues required   HOME EXERCISE PROGRAM: Access Code: [email or web address] URL: [email or web address] Date: 03/20/2022 Prepared by: Bradly Chris  Exercises - Seated Upper Trapezius Stretch  - 1 x daily - 3 reps - 30-60 sec hold - Sternocleidomastoid Stretch  - 1 x daily - 3 reps - 30-60 hold  ASSESSMENT:  CLINICAL IMPRESSION: Patient is a 66 y.o. white female who was seen today for physical therapy evaluation and treatment for right sided neck pain and low back pain. She exhibits decreased cervical ROM and increased neck and low back pain with movement with neck pain accompanied by headaches. These deficits are making it difficult for her to complete her daily tasks that require lifting objects with UE, bending, twisting or squatting like cleaning and cooking. Could not finish entirety of evaluation due to time.    OBJECTIVE IMPAIRMENTS decreased mobility, decreased ROM, decreased strength, impaired flexibility, postural dysfunction, and pain.   ACTIVITY LIMITATIONS carrying, lifting, bending, standing, squatting, stairs, bathing, dressing, reach over head, hygiene/grooming, and caring for others  PARTICIPATION LIMITATIONS: meal prep, cleaning, laundry, shopping, community activity, and yard work  PERSONAL FACTORS Age, Past/current experiences, Time since onset of injury/illness/exacerbation, and 3+ comorbidities: T2DM, Migraines, History of Kidney Stones   are also affecting patient's functional outcome.   REHAB POTENTIAL: Good  CLINICAL DECISION MAKING: Stable/uncomplicated  EVALUATION COMPLEXITY: Low   GOALS: Goals reviewed with patient? No  SHORT TERM GOALS: Target date: 04/03/2022  Pt will be independent with HEP in order to improve strength and balance in order to decrease fall risk and improve function at  home and work. Baseline: NT  Goal status: INITIAL   LONG TERM GOALS: Target date: 05/29/2022   Patient will have improved function and activity level as evidenced by an increase in FOTO score by 10 points or more.  Baseline: 53/100 with target of 61  Goal status: INITIAL  2.  Patient will increase cervical ROM with side bending and rotation for improved cervical mobility and to decrease muscular tension to resolve headaches.  Baseline: Cervical R/L AROM Rot 20/20, R/L Side Bending PROM and AROM 20/25  Goal status: INITIAL  3.  Patient will improve hip strength by  2/3 grade (I.E 4- to 4+) for improved hip stability to offload spinal structures to decrease low back pain.  Baseline: NT  Goal status: INITIAL  PLAN: PT FREQUENCY: 1-2x/week  PT DURATION: 10 weeks  PLANNED INTERVENTIONS: Therapeutic exercises, Neuromuscular re-education, Balance training, Gait training, Patient/Family education, Self Care, Joint mobilization, Joint manipulation, Stair training,  Vestibular training, Canalith repositioning, DME instructions, Aquatic Therapy, Dry Needling, Electrical stimulation, Spinal manipulation, Spinal mobilization, Cryotherapy, Moist heat, Manual therapy, and Re-evaluation  PLAN FOR NEXT SESSION: Finish ROM and Strength testing for low back. Finish parascapular strength testing. Finish functional testing and muscle flexibility testing.   Bradly Chris PT, DPT  03/20/2022, 4:29 PM

## 2022-03-23 ENCOUNTER — Ambulatory Visit: Payer: Medicare Other | Admitting: Physical Therapy

## 2022-03-23 ENCOUNTER — Encounter: Payer: Medicare Other | Admitting: Physical Therapy

## 2022-03-27 ENCOUNTER — Ambulatory Visit: Payer: Medicare Other | Admitting: Physical Therapy

## 2022-03-27 ENCOUNTER — Encounter: Payer: Medicare Other | Admitting: Physical Therapy

## 2022-03-29 ENCOUNTER — Ambulatory Visit: Payer: Medicare Other | Admitting: Physical Therapy

## 2022-03-30 ENCOUNTER — Encounter: Payer: Medicare Other | Admitting: Physical Therapy

## 2022-04-03 ENCOUNTER — Encounter: Payer: Medicare Other | Admitting: Physical Therapy

## 2022-04-03 ENCOUNTER — Ambulatory Visit: Payer: Medicare Other | Admitting: Physical Therapy

## 2022-04-03 DIAGNOSIS — M542 Cervicalgia: Secondary | ICD-10-CM

## 2022-04-03 DIAGNOSIS — M5459 Other low back pain: Secondary | ICD-10-CM | POA: Diagnosis not present

## 2022-04-03 NOTE — Therapy (Addendum)
OUTPATIENT PHYSICAL THERAPY TREATMENT NOTE   Patient Name: Brandi Mahoney MRN: [id number] DOB:11/22/1955, 66 y.o., female Today's Date: 04/03/2022  PCP: Dr. Lorelei Pont  REFERRING PROVIDER: Dr. Lorelei Pont   END OF SESSION:   PT End of Session - 04/03/22 1115     Visit Number 2    Number of Visits 20    Date for PT Re-Evaluation 05/29/22    Authorization Type UHC Medicare    Authorization Time Period 03/20/22-05/29/22    Authorization - Visit Number 2    Authorization - Number of Visits 20    Progress Note Due on Visit 10    PT Start Time 0930    PT Stop Time 1015    PT Time Calculation (min) 45 min    Activity Tolerance Patient tolerated treatment well    Behavior During Therapy [facility] for tasks assessed/performed              Past Medical History:  Diagnosis Date   CAD (coronary artery disease)    a. 01/2010 Cath: mild atherosclerosis in the mid LCX after takeoff of OM1, mild diff LAD dzs;  b. 07/2013 MV: EF 67%, no ischemia.   GAD (generalized anxiety disorder)    History of kidney stones    HLD (hyperlipidemia)    Hypothyroidism    Major depressive disorder, recurrent episode, in full remission ([place]) 09/08/2008   Qualifier: Diagnosis of  By: Lorelei Pont MD, [place]     Migraine headache    Myocarditis ([place])    a. 01/2010 presented w/ c/p and elev trop-->Cath: mild atherosclerosis in the mid LCX after takeoff of OM1, mild diff LAD dzs;  b. 03/2010 Echo: EF 60-65%, DD, mild LVH;  c. 07/2011 Echo: EF 55-60%, mild LVH, no rwma.   Sleep apnea    has c-pap.  wears off and on - not lately  using  per reported 07-29-2020   Type 2 diabetes, diet controlled [facility])    Past Surgical History:  Procedure Laterality Date   APPENDECTOMY  1974   BREAST SURGERY Right 1988   fatty tumore from breast   CARDIAC CATHETERIZATION  8/11   no significant -[facility]- Dr Rockey Situ,   CHOLECYSTECTOMY  2002   COLONOSCOPY  2017   CYST EXCISION     ovarian cyst 1994   NEPHROLITHOTOMY Right 08/20/2014    Procedure: RIGHT PERCUTANEOUS NEPHROLITHOTOMY ;  Surgeon: Malka So, MD;  Location: [facility];  Service: Urology;  Laterality: Right;   POLYPECTOMY     RECTAL EXAM UNDER ANESTHESIA N/A 08/02/2020   Procedure: Darci Needle EXAM UNDER ANESTHESIA INJECTION OF PERIANAL BOTOX;  Surgeon: Ileana Roup, MD;  Location: [place];  Service: General;  Laterality: N/A;   TOTAL ABDOMINAL HYSTERECTOMY W/ BILATERAL SALPINGOOPHORECTOMY  2000   no ovaries, uterus, or cervix.   Patient Active Problem List   Diagnosis Date Noted   Lichen sclerosus 40/98/1191   Coronary artery disease involving native coronary artery of native heart with angina pectoris (HCC)    Type 2 diabetes, diet controlled ([place])    GAD (generalized anxiety disorder)    Obstructive sleep apnea 09/25/2013   Tobacco abuse, in remission 09/19/2013   IBS (irritable bowel syndrome) 09/26/2012   Mixed hyperlipidemia 04/01/2010   Viral myocarditis, 01/2010 01/27/2010   HYPERTRIGLYCERIDEMIA 07/21/2009   Hypothyroidism in adult 09/08/2008   Major depressive disorder, recurrent episode, in full remission ([place]) 09/08/2008   ASTHMA 09/08/2008    REFERRING DIAG: Cervicalgia and right sided low back pain  THERAPY DIAG:  Cervicalgia  Other low back pain  Rationale for Evaluation and Treatment Rehabilitation  PERTINENT HISTORY: Pt reports having a bad back forever and she went to chiropractor to get examined. He explained that he thought it was a bulging discs and told her not to do activity. She also describes having neck pain and limited ROM especially with most of pain on right side. She thinks this might have happened when falling ten years ago and landing on her face.   PRECAUTIONS: None   SUBJECTIVE: Pt reports having increased neck pain and low back pain after traveling in car over weekend.   PAIN:  Are you having pain? Yes: NPRS scale: 5/10, 4 to 5/10  Pain location: Neck and low back  Pain description: Achy   Aggravating factors: Turning neck  Relieving factors: Not moving neck or low back    OBJECTIVE: (objective measures completed at initial evaluation unless otherwise dated)       VITALS: BP 129/59 HR 70 Sp02 95   DIAGNOSTIC FINDINGS: None listed    PATIENT SURVEYS:  FOTO 53/100 with target of 40    COGNITION:           Overall cognitive status: Within functional limits for tasks assessed                          SENSATION: WFL   MUSCLE LENGTH: Hamstrings: Right NT deg; Left NT  deg Thomas test: Right NT deg; Left NT deg   POSTURE: rounded shoulders and forward head   PALPATION: Right paraspinal    LOWER EXTREMITY ROM:           Lumbar ROM:                                               Flexion: 100%                                               Extension:100%                                               SB R/L: 85%*/100%                                               Rot R/L: 85%*/100%                                           *=painful     Active  Right 04/03/2022 Left 04/03/2022  Hip flexion 120 120  Hip extension 30 30  Hip abduction 45 45  Hip adduction 30 30  Hip internal rotation 45 45  Hip external rotation 45 45  Knee flexion 135 135  Knee extension 0 0  Ankle dorsiflexion 20 20  Ankle plantarflexion 50 50  Ankle inversion 35 35  Ankle eversion 15  15   (Blank rows = not tested)                                                                                           Cervical                                                                                                    Norms                               AROM                     PROM                                              Flex    45                                  50                                                                            Ext      45                                  45                       Lat Side Bend R/L 45/45                            20/25                            20/25                        Rotation       R/L     60/60                           20/20  45/45       Active ROM Right eval Left eval  Shoulder flexion 180 180  Shoulder extension 60 60  Shoulder abduction 180  180  Shoulder adduction      Shoulder internal rotation 70 70  Shoulder external rotation 90 90  Elbow flexion 150 150  Elbow extension 60 60  Wrist flexion 80 80  Wrist extension 70 70  Wrist ulnar deviation 30 30  Wrist radial deviation 20 20  Wrist pronation      Wrist supination      (Blank rows = not tested)             UPPER AND LOWER EXTREMITY MMT:                          MMT Right  eval  Left eval  Shoulder flexion 5/5 5/5  Shoulder extension      Shoulder abduction 5/5 5/5  Shoulder adduction      Shoulder internal rotation      Shoulder external rotation      Elbow flexion 5/5 5/5  Elbow extension 5/5 5/5  Wrist flexion 5/5 5/5  Wrist extension 5/5 5/5  Wrist ulnar deviation      Wrist radial deviation      Wrist pronation      Wrist supination          MMT Right eval Left eval  Hip flexion 5   5  Hip extension 4  4   Hip abduction 4+  4+   Hip adduction  4- 4-   Hip internal rotation      Hip external rotation      Knee flexion  5 5   Knee extension  5 5   Ankle dorsiflexion 5  5   Ankle plantarflexion      Ankle inversion      Ankle eversion       (Blank rows = not tested)   LOWER EXTREMITY SPECIAL TESTS:   Deferred, will complete next session  Hip special tests: Saralyn Pilar (FABER) test: NT , FADIR: NT, Trendelenburg test: NT, Thomas test: + Bilateral, Ober's test: negative, Ely's test: + Right side, and SI compression test: NT     FUNCTIONAL TESTS:  5 times sit to stand: NT  30 seconds chair stand test: NT  6 minute walk test: NT  10 meter walk test: NT    GAIT: Distance walked: 50 ft  Assistive device utilized: None Level of assistance: Complete Independence Comments: No gait  abnormalities noted        TODAY'S TREATMENT:  04/03/22:             Lower Trunk 2 x 5 with sec hold            Prone Quad Stretch 2 x 60 sec            Lumbar SB Stretch 3 x 30 sec             Hip MMT, ROM, and flexibility (See chart above in objective)                 Initial: Upper Trap Stretch 3 x 30 sec  SCM Stretch 3 x 30 sec        PATIENT EDUCATION:  Education details: form and technique for appropriate exercise and explanation about underlying deficits  Person educated: Patient Education method: Explanation, Demonstration, Tactile cues, Verbal cues, and Handouts Education  comprehension: verbalized understanding, returned demonstration, verbal cues required, and tactile cues required     HOME EXERCISE PROGRAM: Access Code: [email or web address] URL: [email or web address] Date: 04/03/2022 Prepared by: Bradly Chris  Exercises - Seated Upper Trapezius Stretch  - 1 x daily - 3 reps - 30-60 sec hold - Sternocleidomastoid Stretch  - 1 x daily - 3 reps - 30-60 hold - Supine Lower Trunk Rotation  - 1 x daily - 3 sets - 10 reps - 3 sec  hold - TL Sidebending Stretch - Single Arm Overhead  - 1 x daily - 3 reps - 30-60 hold - Prone Quad Stretch with Strap  - 1 x daily - 3 reps - 30-60 sec hold   ASSESSMENT:   CLINICAL IMPRESSION:  Pt presents for f/u for cervical and low back pain. She exhibits decreased hip strength, lumbar ROM and hip flexibility and increased right sided low back pain. Pt was able to tolerate all exercises without an increase in her low back or cervical with modification to side bend to be performed in standing instead of seated. She will benefit from skilled PT to improve her ability to complete daily tasks that require lifting objects with UE, bending, twisting or squatting like cleaning and cooking.     OBJECTIVE IMPAIRMENTS decreased mobility, decreased ROM, decreased strength, impaired flexibility, postural dysfunction, and pain.    ACTIVITY  LIMITATIONS carrying, lifting, bending, standing, squatting, stairs, bathing, dressing, reach over head, hygiene/grooming, and caring for others   PARTICIPATION LIMITATIONS: meal prep, cleaning, laundry, shopping, community activity, and yard work   PERSONAL FACTORS Age, Past/current experiences, Time since onset of injury/illness/exacerbation, and 3+ comorbidities: T2DM, Migraines, History of Kidney Stones   are also affecting patient's functional outcome.    REHAB POTENTIAL: Good   CLINICAL DECISION MAKING: Stable/uncomplicated   EVALUATION COMPLEXITY: Low     GOALS: Goals reviewed with patient? No   SHORT TERM GOALS: Target date: 04/03/2022  Pt will be independent with HEP in order to improve strength and balance in order to decrease fall risk and improve function at home and work. Baseline: Working on independently  Goal status: Ongoing      LONG TERM GOALS: Target date: 05/29/2022    Patient will have improved function and activity level as evidenced by an increase in FOTO score by 10 points or more.  Baseline: 53/100 with target of 61  Goal status: Ongoing    2.  Patient will increase cervical ROM with side bending and rotation for improved cervical mobility and to decrease muscular tension to resolve headaches.  Baseline: Cervical R/L AROM Rot 20/20, R/L Side Bending PROM and AROM 20/25  Goal status: Ongoing    3.  Patient will improve hip strength by  2/3 grade (I.E 4- to 4+) for improved hip stability to offload spinal structures to decrease low back pain.  Baseline: Hip Add R/L 4-/4- , Hip Abd R/L 4+,4+, Hip Ext R/L 4-/4- Goal status:Ongoing    PLAN: PT FREQUENCY: 1-2x/week   PT DURATION: 10 weeks   PLANNED INTERVENTIONS: Therapeutic exercises, Neuromuscular re-education, Balance training, Gait training, Patient/Family education, Self Care, Joint mobilization, Joint manipulation, Stair training, Vestibular training, Canalith repositioning, DME instructions, Aquatic  Therapy, Dry Needling, Electrical stimulation, Spinal manipulation, Spinal mobilization, Cryotherapy, Moist heat, Manual therapy, and Re-evaluation   PLAN FOR NEXT SESSION: Hip IR and ER MMT testing. Finish parascapular strength testing and SIJ testing. Finish functional testing.  Bradly Chris PT, DPT  04/03/2022, 1:26 PM

## 2022-04-05 ENCOUNTER — Ambulatory Visit: Payer: Medicare Other | Admitting: Physical Therapy

## 2022-04-05 ENCOUNTER — Encounter: Payer: Medicare Other | Admitting: Physical Therapy

## 2022-04-10 ENCOUNTER — Ambulatory Visit: Payer: Medicare Other | Admitting: Physical Therapy

## 2022-04-10 ENCOUNTER — Encounter: Payer: Medicare Other | Admitting: Physical Therapy

## 2022-04-13 ENCOUNTER — Encounter: Payer: Medicare Other | Admitting: Physical Therapy

## 2022-04-17 ENCOUNTER — Encounter: Payer: Medicare Other | Admitting: Physical Therapy

## 2022-04-18 ENCOUNTER — Ambulatory Visit: Payer: Medicare Other | Admitting: Physical Therapy

## 2022-04-20 ENCOUNTER — Encounter: Payer: Medicare Other | Admitting: Physical Therapy

## 2022-04-24 ENCOUNTER — Encounter: Payer: Medicare Other | Admitting: Physical Therapy

## 2022-04-26 ENCOUNTER — Encounter: Payer: Medicare Other | Admitting: Physical Therapy

## 2022-04-27 ENCOUNTER — Ambulatory Visit: Payer: Medicare Other | Admitting: Physical Therapy

## 2022-05-01 ENCOUNTER — Encounter: Payer: Self-pay | Admitting: Physical Therapy

## 2022-05-01 ENCOUNTER — Encounter: Payer: Medicare Other | Admitting: Physical Therapy

## 2022-05-01 ENCOUNTER — Ambulatory Visit: Payer: Medicare Other | Attending: Family Medicine | Admitting: Physical Therapy

## 2022-05-01 DIAGNOSIS — M5459 Other low back pain: Secondary | ICD-10-CM | POA: Diagnosis not present

## 2022-05-01 DIAGNOSIS — M542 Cervicalgia: Secondary | ICD-10-CM | POA: Insufficient documentation

## 2022-05-01 NOTE — Therapy (Signed)
OUTPATIENT PHYSICAL THERAPY TREATMENT NOTE   Patient Name: Brandi Mahoney MRN: [id number] DOB:11/22/1955, 66 y.o., female Today's Date: 04/03/2022  PCP: Dr. Lorelei Pont  REFERRING PROVIDER: Dr. Lorelei Pont   END OF SESSION:   PT End of Session - 04/03/22 1115     Visit Number 2    Number of Visits 20    Date for PT Re-Evaluation 05/29/22    Authorization Type [employer] Medicare    Authorization Time Period 03/20/22-05/29/22    Authorization - Visit Number 2    Authorization - Number of Visits 20    Progress Note Due on Visit 10    PT Start Time 0930    PT Stop Time 1015    PT Time Calculation (min) 45 min    Activity Tolerance Patient tolerated treatment well    Behavior During Therapy [facility] for tasks assessed/performed              Past Medical History:  Diagnosis Date   CAD (coronary artery disease)    a. 01/2010 Cath: mild atherosclerosis in the mid LCX after takeoff of OM1, mild diff LAD dzs;  b. 07/2013 MV: EF 67%, no ischemia.   GAD (generalized anxiety disorder)    History of kidney stones    HLD (hyperlipidemia)    Hypothyroidism    Major depressive disorder, recurrent episode, in full remission ([place]) 09/08/2008   Qualifier: Diagnosis of  By: Lorelei Pont MD, [place]     Migraine headache    Myocarditis ([place])    a. 01/2010 presented w/ c/p and elev trop-->Cath: mild atherosclerosis in the mid LCX after takeoff of OM1, mild diff LAD dzs;  b. 03/2010 Echo: EF 60-65%, DD, mild LVH;  c. 07/2011 Echo: EF 55-60%, mild LVH, no rwma.   Sleep apnea    has c-pap.  wears off and on - not lately  using  per reported 07-29-2020   Type 2 diabetes, diet controlled [facility])    Past Surgical History:  Procedure Laterality Date   APPENDECTOMY  1974   BREAST SURGERY Right 1988   fatty tumore from breast   CARDIAC CATHETERIZATION  8/11   no significant -[facility] Dr Rockey Situ,   CHOLECYSTECTOMY  2002   COLONOSCOPY  2017   CYST EXCISION     ovarian cyst 1994   NEPHROLITHOTOMY Right 08/20/2014    Procedure: RIGHT PERCUTANEOUS NEPHROLITHOTOMY ;  Surgeon: Malka So, MD;  Location: [facility];  Service: Urology;  Laterality: Right;   POLYPECTOMY     RECTAL EXAM UNDER ANESTHESIA N/A 08/02/2020   Procedure: Darci Needle EXAM UNDER ANESTHESIA INJECTION OF PERIANAL BOTOX;  Surgeon: Ileana Roup, MD;  Location: [place];  Service: General;  Laterality: N/A;   TOTAL ABDOMINAL HYSTERECTOMY W/ BILATERAL SALPINGOOPHORECTOMY  2000   no ovaries, uterus, or cervix.   Patient Active Problem List   Diagnosis Date Noted   Lichen sclerosus 40/98/1191   Coronary artery disease involving native coronary artery of native heart with angina pectoris (HCC)    Type 2 diabetes, diet controlled ([place])    GAD (generalized anxiety disorder)    Obstructive sleep apnea 09/25/2013   Tobacco abuse, in remission 09/19/2013   IBS (irritable bowel syndrome) 09/26/2012   Mixed hyperlipidemia 04/01/2010   Viral myocarditis, 01/2010 01/27/2010   HYPERTRIGLYCERIDEMIA 07/21/2009   Hypothyroidism in adult 09/08/2008   Major depressive disorder, recurrent episode, in full remission ([place]) 09/08/2008   ASTHMA 09/08/2008    REFERRING DIAG: Cervicalgia and right sided low back pain  THERAPY DIAG:  Cervicalgia  Other low back pain  Rationale for Evaluation and Treatment Rehabilitation  PERTINENT HISTORY: Pt reports having a bad back forever and she went to chiropractor to get examined. He explained that he thought it was a bulging discs and told her not to do activity. She also describes having neck pain and limited ROM especially with most of pain on right side. She thinks this might have happened when falling ten years ago and landing on her face.   PRECAUTIONS: None   SUBJECTIVE: Pt reports that she has been busy with family and has only been able to exercises periodically.   PAIN:  Are you having pain? Yes: NPRS scale: 4/10, 2/10 in low back   Pain location: Right upper trap and low back   Pain description: Achy  Aggravating factors: Turning neck or back will suddenly lock up  Relieving factors: Not moving neck or low back    OBJECTIVE: (objective measures completed at initial evaluation unless otherwise dated)       VITALS: BP 129/59 HR 70 Sp02 95   DIAGNOSTIC FINDINGS: None listed    PATIENT SURVEYS:  FOTO 53/100 with target of 63    COGNITION:           Overall cognitive status: Within functional limits for tasks assessed                          SENSATION: WFL   MUSCLE LENGTH: Hamstrings: Right NT deg; Left NT  deg Thomas test: Right NT deg; Left NT deg   POSTURE: rounded shoulders and forward head   PALPATION: Right paraspinal    LOWER EXTREMITY ROM:           Lumbar ROM:                                               Flexion: 100%                                               Extension:100%                                               SB R/L: 85%*/100%                                               Rot R/L: 85%*/100%                                           *=painful     Active  Right 04/03/2022 Left 04/03/2022  Hip flexion 120 120  Hip extension 30 30  Hip abduction 45 45  Hip adduction 30 30  Hip internal rotation 45 45  Hip external rotation 45 45  Knee flexion 135 135  Knee extension 0 0  Ankle dorsiflexion 20 20  Ankle plantarflexion  50 50  Ankle inversion 35 35  Ankle eversion 15 15   (Blank rows = not tested)                                                                                           Cervical                                                                                                    [place]                               AROM                     PROM                                              Flex    45                                  50                                                                            Ext      45                                  45                       Lat Side Bend R/L  45/45                            20/25                           20/25                        Rotation       R/L     60/60  20/20                           45/45       Active ROM Right eval Left eval  Shoulder flexion 180 180  Shoulder extension 60 60  Shoulder abduction 180  180  Shoulder adduction      Shoulder internal rotation 70 70  Shoulder external rotation 90 90  Elbow flexion 150 150  Elbow extension 60 60  Wrist flexion 80 80  Wrist extension 70 70  Wrist ulnar deviation 30 30  Wrist radial deviation 20 20  Wrist pronation      Wrist supination      (Blank rows = not tested)             UPPER AND LOWER EXTREMITY MMT:                          MMT Right  eval  Left eval  Shoulder flexion 5/5 5/5  Shoulder extension  4-/5  4-/5   Shoulder abduction 5/5 5/5  Shoulder adduction      Shoulder internal rotation  4/5 4/5   Shoulder external rotation  4/5  4/5  Elbow flexion 5/5 5/5  Elbow extension 5/5 5/5  Wrist flexion 5/5 5/5  Wrist extension 5/5 5/5  Wrist ulnar deviation      Wrist radial deviation      Wrist pronation      Wrist supination          MMT Right eval Left eval  Hip flexion 5/5  5/5  Hip extension 4/5 4/5  Hip abduction 4+/5 4+/5  Hip adduction  4-/5 4-/5  Hip internal rotation  4/5  4/5  Hip external rotation  4/5  4/5  Knee flexion  5/5 5/5  Knee extension  5/5 5/5  Ankle dorsiflexion 5/5 5/5  Ankle plantarflexion      Ankle inversion      Ankle eversion       (Blank rows = not tested)   LOWER EXTREMITY SPECIAL TESTS:   Deferred, will complete next session  Hip special tests: Saralyn Pilar (FABER) test: NT , FADIR: NT, Trendelenburg test: NT, Thomas test: + Bilateral, Ober's test: negative, Ely's test: + Right side, and SI compression test: NT     FUNCTIONAL TESTS:  5 times sit to stand: NT  30 seconds chair stand test: NT  6 minute walk test: NT  10 meter walk test: NT    GAIT: Distance walked:  50 ft  Assistive device utilized: None Level of assistance: Complete Independence Comments: No gait abnormalities noted        TODAY'S TREATMENT:  05/01/22:            UBE for 5 min with seat at 8                        Shoulder IR R/L 4+/4+           Shoulder ER R/L 4+/4+           Shoulder Ext R/L 4-/4-           Mid Trap R/L 4/4           Lower Trap R/L 4-/4-            Lat (Ext) R/L 4-/4-  Upper Trap Stretch 2 x 30 sec            Levator Scap Stretch 2 x 30 sec            Side Bending Stretch on Left side bend 2 x 30 sec            04/03/22:             Lower Trunk 2 x 5 with sec hold            Prone Quad Stretch 2 x 60 sec            Lumbar SB Stretch 3 x 30 sec             Hip MMT, ROM, and flexibility (See chart above in objective)                 Initial: Upper Trap Stretch 3 x 30 sec  SCM Stretch 3 x 30 sec        PATIENT EDUCATION:  Education details: form and technique for appropriate exercise and explanation about underlying deficits  Person educated: Patient Education method: Explanation, Demonstration, Tactile cues, Verbal cues, and Handouts Education comprehension: verbalized understanding, returned demonstration, verbal cues required, and tactile cues required     HOME EXERCISE PROGRAM: Access Code: [email or web address] URL: [email or web address] Date: 04/03/2022 Prepared by: Bradly Chris  Exercises - Seated Upper Trapezius Stretch  - 1 x daily - 3 reps - 30-60 sec hold - Sternocleidomastoid Stretch  - 1 x daily - 3 reps - 30-60 hold - Supine Lower Trunk Rotation  - 1 x daily - 3 sets - 10 reps - 3 sec  hold - TL Sidebending Stretch - Single Arm Overhead  - 1 x daily - 3 reps - 30-60 hold - Prone Quad Stretch with Strap  - 1 x daily - 3 reps - 30-60 sec hold   ASSESSMENT:   CLINICAL IMPRESSION: Pt presents for f/u for cervical and low back pain after prolonged hiatus due to family matters. MMT measurements  completed with pt showing decreased parascapular strength. HEP modified to include additional cervical stretches and parascapular strengthening exercises. She was able to perform without an increase in her neck or low back pain. She will continue to benefit from skilled PT to improve her ability to complete daily tasks that require lifting objects with UE, bending, twisting or squatting like cleaning and cooking.  OBJECTIVE IMPAIRMENTS decreased mobility, decreased ROM, decreased strength, impaired flexibility, postural dysfunction, and pain.    ACTIVITY LIMITATIONS carrying, lifting, bending, standing, squatting, stairs, bathing, dressing, reach over head, hygiene/grooming, and caring for others   PARTICIPATION LIMITATIONS: meal prep, cleaning, laundry, shopping, community activity, and yard work   PERSONAL FACTORS Age, Past/current experiences, Time since onset of injury/illness/exacerbation, and 3+ comorbidities: T2DM, Migraines, History of Kidney Stones   are also affecting patient's functional outcome.    REHAB POTENTIAL: Good   CLINICAL DECISION MAKING: Stable/uncomplicated   EVALUATION COMPLEXITY: Low     GOALS: Goals reviewed with patient? No   SHORT TERM GOALS: Target date: 04/03/2022  Pt will be independent with HEP in order to improve strength and balance in order to decrease fall risk and improve function at home and work. Baseline: Working on independently  Goal status: Ongoing      LONG TERM GOALS: Target date: 05/29/2022    Patient will have improved function and activity level as evidenced by an increase in FOTO score by 10 points or  more.  Baseline: 53/100 with target of 61  Goal status: Ongoing    2.  Patient will increase cervical ROM with side bending and rotation for improved cervical mobility and to decrease muscular tension to resolve headaches.  Baseline: Cervical R/L AROM Rot 20/20, R/L Side Bending PROM and AROM 20/25  Goal status: Ongoing    3.  Patient  will improve hip and parascapular strength by  2/3 grade (I.E 4- to 4+) for improved hip stability to offload spinal structures to decrease low back pain.  Baseline: Hip Add R/L 4-/4- , Hip Abd R/L 4+,4+, Hip Ext R/L 4-/4- Hip IR R/L 4/4, Hip ER R/L 4/4, Shoulder Ext R/L 4-/4-, Mid Trap R/L 4-/4-, Lower Trap 4-/4-   Goal status:Ongoing    PLAN: PT FREQUENCY: 1-2x/week   PT DURATION: 10 weeks   PLANNED INTERVENTIONS: Therapeutic exercises, Neuromuscular re-education, Balance training, Gait training, Patient/Family education, Self Care, Joint mobilization, Joint manipulation, Stair training, Vestibular training, Canalith repositioning, DME instructions, Aquatic Therapy, Dry Needling, Electrical stimulation, Spinal manipulation, Spinal mobilization, Cryotherapy, Moist heat, Manual therapy, and Re-evaluation   PLAN FOR NEXT SESSION: Complete SIJ testing and hip special tests . Progress parascapular and lower back strengthening exercises: hip abduction, Hip extension, seated rows   Bradly Chris PT, DPT  04/03/2022, 1:26 PM

## 2022-05-03 ENCOUNTER — Encounter: Payer: Medicare Other | Admitting: Physical Therapy

## 2022-05-09 ENCOUNTER — Ambulatory Visit: Payer: Medicare Other | Admitting: Physical Therapy

## 2022-05-13 ENCOUNTER — Other Ambulatory Visit: Payer: Self-pay | Admitting: Family Medicine

## 2022-05-14 NOTE — Telephone Encounter (Signed)
Last office visit 02/09/22 for DDD.  Last refilled 02/09/22 for #30 with 2 refills. No future appointments with PCP.

## 2022-05-16 ENCOUNTER — Ambulatory Visit: Payer: Medicare Other | Admitting: Physical Therapy

## 2022-05-23 ENCOUNTER — Encounter: Payer: Self-pay | Admitting: Physical Therapy

## 2022-05-23 ENCOUNTER — Ambulatory Visit: Payer: Medicare Other | Attending: Family Medicine

## 2022-05-23 DIAGNOSIS — M5459 Other low back pain: Secondary | ICD-10-CM | POA: Insufficient documentation

## 2022-05-23 DIAGNOSIS — M542 Cervicalgia: Secondary | ICD-10-CM | POA: Diagnosis not present

## 2022-05-23 NOTE — Progress Notes (Unsigned)
    Zakariye Nee T. Maykayla Highley, MD, [place] at [facility] [place], [address]  Phone: [phone]  FAX: [phone]  WAVE CALZADA - 66 y.o. female  MRN [id number]  Date of Birth: 1956-06-19  Date: 05/24/2022  PCP: Owens Loffler, MD  Referral: Owens Loffler, MD  No chief complaint on file.  Subjective:   DRAKE LANDING is a 66 y.o. very pleasant female patient with There is no height or weight on file to calculate BMI. who presents with the following:  Pleasant 66 year old patient who I recall quite well, she presents today with some ongoing dry cough.    Review of Systems is noted in the HPI, as appropriate  Objective:   There were no vitals taken for this visit.  GEN: No acute distress; alert,appropriate. PULM: Breathing comfortably in no respiratory distress PSYCH: Normally interactive.   Laboratory and Imaging Data:  Assessment and Plan:   ***

## 2022-05-23 NOTE — Therapy (Signed)
OUTPATIENT PHYSICAL THERAPY TREATMENT NOTE   Patient Name: Brandi Mahoney MRN: [id number] DOB:July 26, 1955, 66 y.o., female Today's Date: 05/23/2022  PCP: Dr. Lorelei Pont  REFERRING PROVIDER: Dr. Lorelei Pont   END OF SESSION:   PT End of Session - 05/23/22 0807     Visit Number 4    Number of Visits 20    Date for PT Re-Evaluation 05/29/22    Authorization Type [employer] Medicare    Authorization Time Period 03/20/22-05/29/22    Authorization - Visit Number 4    Authorization - Number of Visits 20    Progress Note Due on Visit 10    PT Start Time 0802    PT Stop Time 0845    PT Time Calculation (min) 43 min    Activity Tolerance Patient tolerated treatment well    Behavior During Therapy [facility] for tasks assessed/performed              Past Medical History:  Diagnosis Date   CAD (coronary artery disease)    a. 01/2010 Cath: mild atherosclerosis in the mid LCX after takeoff of OM1, mild diff LAD dzs;  b. 07/2013 MV: EF 67%, no ischemia.   GAD (generalized anxiety disorder)    History of kidney stones    HLD (hyperlipidemia)    Hypothyroidism    Major depressive disorder, recurrent episode, in full remission ([place]) 09/08/2008   Qualifier: Diagnosis of  By: Lorelei Pont MD, [place]     Migraine headache    Myocarditis ([place])    a. 01/2010 presented w/ c/p and elev trop-->Cath: mild atherosclerosis in the mid LCX after takeoff of OM1, mild diff LAD dzs;  b. 03/2010 Echo: EF 60-65%, DD, mild LVH;  c. 07/2011 Echo: EF 55-60%, mild LVH, no rwma.   Sleep apnea    has c-pap.  wears off and on - not lately  using  per reported 07-29-2020   Type 2 diabetes, diet controlled [facility])    Past Surgical History:  Procedure Laterality Date   APPENDECTOMY  1974   BREAST SURGERY Right 1988   fatty tumore from breast   CARDIAC CATHETERIZATION  8/11   no significant -[facility] Dr Rockey Situ,   CHOLECYSTECTOMY  2002   COLONOSCOPY  2017   CYST EXCISION     ovarian cyst 1994   NEPHROLITHOTOMY Right 08/20/2014    Procedure: RIGHT PERCUTANEOUS NEPHROLITHOTOMY ;  Surgeon: Malka So, MD;  Location: [facility];  Service: Urology;  Laterality: Right;   POLYPECTOMY     RECTAL EXAM UNDER ANESTHESIA N/A 08/02/2020   Procedure: Darci Needle EXAM UNDER ANESTHESIA INJECTION OF PERIANAL BOTOX;  Surgeon: Ileana Roup, MD;  Location: [facility];  Service: General;  Laterality: N/A;   TOTAL ABDOMINAL HYSTERECTOMY W/ BILATERAL SALPINGOOPHORECTOMY  2000   no ovaries, uterus, or cervix.   Patient Active Problem List   Diagnosis Date Noted   Lichen sclerosus 26/33/3545   Coronary artery disease involving native coronary artery of native heart with angina pectoris (HCC)    Type 2 diabetes, diet controlled ([place])    GAD (generalized anxiety disorder)    Obstructive sleep apnea 09/25/2013   Tobacco abuse, in remission 09/19/2013   IBS (irritable bowel syndrome) 09/26/2012   Mixed hyperlipidemia 04/01/2010   Viral myocarditis, 01/2010 01/27/2010   HYPERTRIGLYCERIDEMIA 07/21/2009   Hypothyroidism in adult 09/08/2008   Major depressive disorder, recurrent episode, in full remission ([place]) 09/08/2008   ASTHMA 09/08/2008    REFERRING DIAG: Cervicalgia and right sided low back pain  THERAPY DIAG:  Cervicalgia  Other low back pain  Rationale for Evaluation and Treatment Rehabilitation  PERTINENT HISTORY: Pt reports having a bad back forever and she went to chiropractor to get examined. He explained that he thought it was a bulging discs and told her not to do activity. She also describes having neck pain and limited ROM especially with most of pain on right side. She thinks this might have happened when falling ten years ago and landing on her face.   PRECAUTIONS: None   SUBJECTIVE: Pt reports exercises has been helpful for neck. Current pain 4/10 NPS in neck and lower back.   PAIN:  Are you having pain? Yes: NPRS scale: 4/10 Pain location: Right upper trap and low back  Pain description: Achy   Aggravating factors: Turning neck or back will suddenly lock up  Relieving factors: Not moving neck or low back    OBJECTIVE: (objective measures completed at initial evaluation unless otherwise dated)       VITALS: BP 129/59 HR 70 Sp02 95   DIAGNOSTIC FINDINGS: None listed    PATIENT SURVEYS:  FOTO 53/100 with target of 53    COGNITION:           Overall cognitive status: Within functional limits for tasks assessed                          SENSATION: WFL   MUSCLE LENGTH: Hamstrings: Right NT deg; Left NT  deg Thomas test: Right NT deg; Left NT deg   POSTURE: rounded shoulders and forward head   PALPATION: Right paraspinal    LOWER EXTREMITY ROM:           Lumbar ROM:                                               Flexion: 100%                                               Extension:100%                                               SB R/L: 85%*/100%                                               Rot R/L: 85%*/100%                                           *=painful     Active  Right 04/03/2022 Left 04/03/2022  Hip flexion 120 120  Hip extension 30 30  Hip abduction 45 45  Hip adduction 30 30  Hip internal rotation 45 45  Hip external rotation 45 45  Knee flexion 135 135  Knee extension 0 0  Ankle dorsiflexion 20 20  Ankle plantarflexion 50 50  Ankle inversion 35  35  Ankle eversion 15 15   (Blank rows = not tested)                                                                                           Cervical                                                                                                    [place]                               AROM                     PROM                                              Flex    45                                  50                                                                            Ext      45                                  45                       Lat Side Bend R/L 45/45                             20/25                           20/25                        Rotation       R/L     60/60  20/20                           45/45       Active ROM Right eval Left eval  Shoulder flexion 180 180  Shoulder extension 60 60  Shoulder abduction 180  180  Shoulder adduction      Shoulder internal rotation 70 70  Shoulder external rotation 90 90  Elbow flexion 150 150  Elbow extension 60 60  Wrist flexion 80 80  Wrist extension 70 70  Wrist ulnar deviation 30 30  Wrist radial deviation 20 20  Wrist pronation      Wrist supination      (Blank rows = not tested)   Lower extremity AROM   Hip flexion R/L: 109/110  Hip IR R/L: 19/25 Hip ER R/L:40/42              UPPER AND LOWER EXTREMITY MMT:                          MMT Right  eval  Left eval  Shoulder flexion 5/5 5/5  Shoulder extension  4-/5  4-/5   Shoulder abduction 5/5 5/5  Shoulder adduction      Shoulder internal rotation  4/5 4/5   Shoulder external rotation  4/5  4/5  Elbow flexion 5/5 5/5  Elbow extension 5/5 5/5  Wrist flexion 5/5 5/5  Wrist extension 5/5 5/5  Wrist ulnar deviation      Wrist radial deviation      Wrist pronation      Wrist supination          MMT Right eval Left eval  Hip flexion 5/5  5/5  Hip extension 4/5 4/5  Hip abduction 4+/5 4+/5  Hip adduction  4-/5 4-/5  Hip internal rotation  4/5  4/5  Hip external rotation  4/5  4/5  Knee flexion  5/5 5/5  Knee extension  5/5 5/5  Ankle dorsiflexion 5/5 5/5  Ankle plantarflexion      Ankle inversion      Ankle eversion       (Blank rows = not tested)   LOWER EXTREMITY SPECIAL TESTS:   Deferred, will complete next session  Hip special tests: Saralyn Pilar (FABER) test: NT , FADIR: NT, Trendelenburg test: NT, Thomas test: + Bilateral, Ober's test: negative, Ely's test: + Right side, and SI compression test: NT     FUNCTIONAL TESTS:  5 times sit to stand: NT  30 seconds chair stand test: NT  6 minute walk  test: NT  10 meter walk test: NT    GAIT: Distance walked: 50 ft  Assistive device utilized: None Level of assistance: Complete Independence Comments: No gait abnormalities noted        TODAY'S TREATMENT:  05/23/22: There.ex: Nu-Step L3 for 5 minutes for gentle UE/LE warm up and spinal mobility  Hip flexion R/L: 109/110  Hip IR R/L: 19/25. Firm to hard end feel bilaterally Hip ER R/L:40/42. Normal end feel bilaterally  SIJ pain provocation: Gaenslen's negative on L, positive on R. Negative sacral thrust, sacral distraction.   Seated Upper trap stretch: 3x30 sec bilat  Seated levator scap stretch: 2x30 sec bilat. Min to mod VC's for form/technique.   Supine Seated rotation stretch: x15/side, 3-5 sec holds with MH applied to posterior cervical spine.   Supine cervical retractions: 2x15, MH applied to posterior cervical spine.  Prone hip IR/ER:  x12/direction. Added to HEP     05/01/22:            UBE for 5 min with seat at 8                        Shoulder IR R/L 4+/4+           Shoulder ER R/L 4+/4+           Shoulder Ext R/L 4-/4-           Mid Trap R/L 4/4           Lower Trap R/L 4-/4-            Lat (Ext) R/L 4-/4-                        Upper Trap Stretch 2 x 30 sec            Levator Scap Stretch 2 x 30 sec            Side Bending Stretch on Left side bend 2 x 30 sec            04/03/22:             Lower Trunk 2 x 5 with sec hold            Prone Quad Stretch 2 x 60 sec            Lumbar SB Stretch 3 x 30 sec             Hip MMT, ROM, and flexibility (See chart above in objective)                 Initial: Upper Trap Stretch 3 x 30 sec  SCM Stretch 3 x 30 sec        PATIENT EDUCATION:  Education details: form and technique for appropriate exercise and explanation about underlying deficits  Person educated: Patient Education method: Explanation, Demonstration, Tactile cues, Verbal cues, and Handouts Education comprehension: verbalized  understanding, returned demonstration, verbal cues required, and tactile cues required     HOME EXERCISE PROGRAM: Access Code: [email or web address] URL: [email or web address] Date: 04/03/2022 Prepared by: Bradly Chris  Exercises - Seated Upper Trapezius Stretch  - 1 x daily - 3 reps - 30-60 sec hold - Sternocleidomastoid Stretch  - 1 x daily - 3 reps - 30-60 hold - Supine Lower Trunk Rotation  - 1 x daily - 3 sets - 10 reps - 3 sec  hold - TL Sidebending Stretch - Single Arm Overhead  - 1 x daily - 3 reps - 30-60 hold - Prone Quad Stretch with Strap  - 1 x daily - 3 reps - 30-60 sec hold   ASSESSMENT:   CLINICAL IMPRESSION: Pt limited in attending PT due to recent illness. Pt only on 4th visit with further testing performed. SIJ testing negative but notable concordant R sided LBP with FADIR and hip IR measurements with significant limitations in this plane. Updated HEP to intervene on hip IR limitations. Began initiating cervical AROM and periscapular strengthening. Pt will continue to benefit from skilled PT intervention to address pain in cervical spine and lumbar spine and impairments to optimize return to PLOF.   OBJECTIVE IMPAIRMENTS decreased mobility, decreased ROM, decreased strength, impaired flexibility, postural dysfunction, and pain.    ACTIVITY LIMITATIONS carrying, lifting, bending, standing, squatting, stairs, bathing, dressing, reach over head, hygiene/grooming, and caring for others  PARTICIPATION LIMITATIONS: meal prep, cleaning, laundry, shopping, community activity, and yard work   PERSONAL FACTORS Age, Past/current experiences, Time since onset of injury/illness/exacerbation, and 3+ comorbidities: T2DM, Migraines, History of Kidney Stones   are also affecting patient's functional outcome.    REHAB POTENTIAL: Good   CLINICAL DECISION MAKING: Stable/uncomplicated   EVALUATION COMPLEXITY: Low     GOALS: Goals reviewed with patient? No   SHORT TERM GOALS:  Target date: 04/03/2022  Pt will be independent with HEP in order to improve strength and balance in order to decrease fall risk and improve function at home and work. Baseline: Working on independently  Goal status: Ongoing      LONG TERM GOALS: Target date: 05/29/2022    Patient will have improved function and activity level as evidenced by an increase in FOTO score by 10 points or more.  Baseline: 53/100 with target of 61  Goal status: Ongoing    2.  Patient will increase cervical ROM with side bending and rotation for improved cervical mobility and to decrease muscular tension to resolve headaches.  Baseline: Cervical R/L AROM Rot 20/20, R/L Side Bending PROM and AROM 20/25  Goal status: Ongoing    3.  Patient will improve hip and parascapular strength by  2/3 grade (I.E 4- to 4+) for improved hip stability to offload spinal structures to decrease low back pain.  Baseline: Hip Add R/L 4-/4- , Hip Abd R/L 4+,4+, Hip Ext R/L 4-/4- Hip IR R/L 4/4, Hip ER R/L 4/4, Shoulder Ext R/L 4-/4-, Mid Trap R/L 4-/4-, Lower Trap 4-/4-   Goal status:Ongoing    PLAN: PT FREQUENCY: 1-2x/week   PT DURATION: 10 weeks   PLANNED INTERVENTIONS: Therapeutic exercises, Neuromuscular re-education, Balance training, Gait training, Patient/Family education, Self Care, Joint mobilization, Joint manipulation, Stair training, Vestibular training, Canalith repositioning, DME instructions, Aquatic Therapy, Dry Needling, Electrical stimulation, Spinal manipulation, Spinal mobilization, Cryotherapy, Moist heat, Manual therapy, and Re-evaluation   PLAN FOR NEXT SESSION: Cervical limitations and lumbar limitations. Mosses Fairly IV, PT, DPT Physical Therapist- [facility]  05/23/2022, 10:28 AM

## 2022-05-24 ENCOUNTER — Encounter: Payer: Self-pay | Admitting: Family Medicine

## 2022-05-24 ENCOUNTER — Other Ambulatory Visit: Payer: Self-pay | Admitting: Family Medicine

## 2022-05-24 ENCOUNTER — Ambulatory Visit (INDEPENDENT_AMBULATORY_CARE_PROVIDER_SITE_OTHER)
Admission: RE | Admit: 2022-05-24 | Discharge: 2022-05-24 | Disposition: A | Discharge status: Home / Self Care | Payer: Medicare Other | Source: Ambulatory Visit | Attending: Family Medicine | Admitting: Family Medicine

## 2022-05-24 ENCOUNTER — Ambulatory Visit (INDEPENDENT_AMBULATORY_CARE_PROVIDER_SITE_OTHER): Payer: Medicare Other | Admitting: Family Medicine

## 2022-05-24 VITALS — BP 130/74 | HR 76 | Temp 98.8°F | Ht 64.0 in | Wt 169.4 lb

## 2022-05-24 DIAGNOSIS — R059 Cough, unspecified: Secondary | ICD-10-CM | POA: Diagnosis not present

## 2022-05-24 DIAGNOSIS — R058 Other specified cough: Secondary | ICD-10-CM

## 2022-05-25 ENCOUNTER — Other Ambulatory Visit: Payer: Self-pay | Admitting: *Deleted

## 2022-05-25 ENCOUNTER — Encounter: Payer: Self-pay | Admitting: Physical Therapy

## 2022-05-25 ENCOUNTER — Ambulatory Visit: Payer: Medicare Other

## 2022-05-25 DIAGNOSIS — M5459 Other low back pain: Secondary | ICD-10-CM

## 2022-05-25 DIAGNOSIS — M542 Cervicalgia: Secondary | ICD-10-CM

## 2022-05-25 MED ORDER — SERTRALINE HCL 50 MG PO TABS: 50.0000 mg | ORAL_TABLET | Freq: Every day | ORAL | 1 refills | Status: DC

## 2022-05-25 NOTE — Therapy (Addendum)
OUTPATIENT PHYSICAL THERAPY TREATMENT NOTE/ Re-Certification   Dates of Reporting: 03/31/22-05/29/22  Patient Name: Brandi Mahoney MRN: [id number] DOB:1955/09/08, 66 y.o., female Today's Date: 05/25/2022  PCP: Dr. Lorelei Pont  REFERRING PROVIDER: Dr. Lorelei Pont   END OF SESSION:   PT End of Session - 05/25/22 1333     Visit Number 5    Number of Visits 20    Date for PT Re-Evaluation 05/29/22    Authorization Type [employer] Medicare    Authorization Time Period 03/20/22-05/29/22    Authorization - Visit Number 5    Authorization - Number of Visits 20    Progress Note Due on Visit 10    PT Start Time 1330    PT Stop Time 1413    PT Time Calculation (min) 43 min    Activity Tolerance Patient tolerated treatment well    Behavior During Therapy [facility] for tasks assessed/performed              Past Medical History:  Diagnosis Date   CAD (coronary artery disease)    a. 01/2010 Cath: mild atherosclerosis in the mid LCX after takeoff of OM1, mild diff LAD dzs;  b. 07/2013 MV: EF 67%, no ischemia.   GAD (generalized anxiety disorder)    History of kidney stones    HLD (hyperlipidemia)    Hypothyroidism    Major depressive disorder, recurrent episode, in full remission ([place]) 09/08/2008   Qualifier: Diagnosis of  By: Lorelei Pont MD, [place]     Migraine headache    Myocarditis ([place])    a. 01/2010 presented w/ c/p and elev trop-->Cath: mild atherosclerosis in the mid LCX after takeoff of OM1, mild diff LAD dzs;  b. 03/2010 Echo: EF 60-65%, DD, mild LVH;  c. 07/2011 Echo: EF 55-60%, mild LVH, no rwma.   Sleep apnea    has c-pap.  wears off and on - not lately  using  per reported 07-29-2020   Type 2 diabetes, diet controlled [facility])    Past Surgical History:  Procedure Laterality Date   APPENDECTOMY  1974   BREAST SURGERY Right 1988   fatty tumore from breast   CARDIAC CATHETERIZATION  8/11   no significant [facility] Dr Rockey Situ,   CHOLECYSTECTOMY  2002   COLONOSCOPY  2017   CYST EXCISION      ovarian cyst 1994   NEPHROLITHOTOMY Right 08/20/2014   Procedure: RIGHT PERCUTANEOUS NEPHROLITHOTOMY ;  Surgeon: Malka So, MD;  Location: [facility];  Service: Urology;  Laterality: Right;   POLYPECTOMY     RECTAL EXAM UNDER ANESTHESIA N/A 08/02/2020   Procedure: Darci Needle EXAM UNDER ANESTHESIA INJECTION OF PERIANAL BOTOX;  Surgeon: Ileana Roup, MD;  Location: [place];  Service: General;  Laterality: N/A;   TOTAL ABDOMINAL HYSTERECTOMY W/ BILATERAL SALPINGOOPHORECTOMY  2000   no ovaries, uterus, or cervix.   Patient Active Problem List   Diagnosis Date Noted   Lichen sclerosus 72/62/0355   Coronary artery disease involving native coronary artery of native heart with angina pectoris (HCC)    Type 2 diabetes, diet controlled ([place])    GAD (generalized anxiety disorder)    Obstructive sleep apnea 09/25/2013   Tobacco abuse, in remission 09/19/2013   IBS (irritable bowel syndrome) 09/26/2012   Mixed hyperlipidemia 04/01/2010   Viral myocarditis, 01/2010 01/27/2010   HYPERTRIGLYCERIDEMIA 07/21/2009   Hypothyroidism in adult 09/08/2008   Major depressive disorder, recurrent episode, in full remission ([place]) 09/08/2008   ASTHMA 09/08/2008    REFERRING DIAG: Cervicalgia and  right sided low back pain   THERAPY DIAG:  Cervicalgia  Other low back pain  Rationale for Evaluation and Treatment Rehabilitation  PERTINENT HISTORY: Pt reports having a bad back forever and she went to chiropractor to get examined. He explained that he thought it was a bulging discs and told her not to do activity. She also describes having neck pain and limited ROM especially with most of pain on right side. She thinks this might have happened when falling ten years ago and landing on her face.   PRECAUTIONS: None   SUBJECTIVE: Pt reports worsening neck pain this date. LBP is very mild today.   PAIN:  Are you having pain? Yes: NPRS scale: 4/10 Pain location: Right upper trap and low  back  Pain description: Achy  Aggravating factors: Turning neck or back will suddenly lock up  Relieving factors: Not moving neck or low back    OBJECTIVE: (objective measures completed at initial evaluation unless otherwise dated)       VITALS: BP 129/59 HR 70 Sp02 95   DIAGNOSTIC FINDINGS: None listed    PATIENT SURVEYS:  FOTO 53/100 with target of 61    COGNITION:           Overall cognitive status: Within functional limits for tasks assessed                          SENSATION: WFL   MUSCLE LENGTH: Hamstrings: Right NT deg; Left NT  deg Thomas test: Right NT deg; Left NT deg   POSTURE: rounded shoulders and forward head   PALPATION: Right paraspinal    LOWER EXTREMITY ROM:           Lumbar ROM:                                               Flexion: 100%                                               Extension:100%                                               SB R/L: 85%*/100%                                               Rot R/L: 85%*/100%                                           *=painful     Active  Right 04/03/2022 Left 04/03/2022  Hip flexion 120 120  Hip extension 30 30  Hip abduction 45 45  Hip adduction 30 30  Hip internal rotation 45 45  Hip external rotation 45 45  Knee flexion 135 135  Knee extension 0 0  Ankle dorsiflexion 20 20  Ankle plantarflexion 50 50  Ankle  inversion 35 35  Ankle eversion 15 15   (Blank rows = not tested)                                                                                           Cervical                                                                                                    [place]                               AROM                     PROM                                              Flex    45                                  50                                                                            Ext      45                                  45                       Lat Side Bend R/L  45/45                            20/25                           20/25                        Rotation       R/L     60/60  20/20                           45/45       Active ROM Right eval Left eval  Shoulder flexion 180 180  Shoulder extension 60 60  Shoulder abduction 180  180  Shoulder adduction      Shoulder internal rotation 70 70  Shoulder external rotation 90 90  Elbow flexion 150 150  Elbow extension 60 60  Wrist flexion 80 80  Wrist extension 70 70  Wrist ulnar deviation 30 30  Wrist radial deviation 20 20  Wrist pronation      Wrist supination      (Blank rows = not tested)   Lower extremity AROM   Hip flexion R/L: 109/110  Hip IR R/L: 19/25 Hip ER R/L:40/42              UPPER AND LOWER EXTREMITY MMT:                          MMT Right  eval  Left eval  Shoulder flexion 5/5 5/5  Shoulder extension  4-/5  4-/5   Shoulder abduction 5/5 5/5  Shoulder adduction      Shoulder internal rotation  4/5 4/5   Shoulder external rotation  4/5  4/5  Elbow flexion 5/5 5/5  Elbow extension 5/5 5/5  Wrist flexion 5/5 5/5  Wrist extension 5/5 5/5  Wrist ulnar deviation      Wrist radial deviation      Wrist pronation      Wrist supination          MMT Right eval Left eval  Hip flexion 5/5  5/5  Hip extension 4/5 4/5  Hip abduction 4+/5 4+/5  Hip adduction  4-/5 4-/5  Hip internal rotation  4/5  4/5  Hip external rotation  4/5  4/5  Knee flexion  5/5 5/5  Knee extension  5/5 5/5  Ankle dorsiflexion 5/5 5/5  Ankle plantarflexion      Ankle inversion      Ankle eversion       (Blank rows = not tested)   LOWER EXTREMITY SPECIAL TESTS:   Deferred, will complete next session  Hip special tests: Saralyn Pilar (FABER) test: NT , FADIR: NT, Trendelenburg test: NT, Thomas test: + Bilateral, Ober's test: negative, Ely's test: + Right side, and SI compression test: NT     FUNCTIONAL TESTS:  5 times sit to stand: NT  30 seconds chair  stand test: NT  6 minute walk test: NT  10 meter walk test: NT    GAIT: Distance walked: 50 ft  Assistive device utilized: None Level of assistance: Complete Independence Comments: No gait abnormalities noted        TODAY'S TREATMENT:  05/25/22:  There.ex: Nu-Step L4 for 5 minutes for gentle UE/LE warm up and spinal mobility Seated Upper trap stretch: 3x30 sec bilat Seated levator scap stretch: 2x30 sec bilat. Min multimodal cues for form/technique.  Seated cervical extension SNAGS: x20 Seated cervical rotation SNAGS: x12 B shoulder extension low trap row: RTB, 3x8  OMEGA rows: 15#, 3x8  Manual Therapy: 10 minutes total Pt in supine R C2-C7 down glides Grade 3, 3x10 sec/bout  Pt in prone; C2-T12 UPA's and R sided CPA's: Grade 3 3x10 sec/bout     PATIENT EDUCATION:  Education details: form and technique for appropriate exercise and explanation about underlying deficits  Person educated: Patient Education method: Explanation, Demonstration, Tactile cues, Verbal cues, and Handouts Education comprehension: verbalized understanding, returned demonstration, verbal cues required, and tactile cues required     HOME EXERCISE PROGRAM: Access Code: [email or web address] URL: [email or web address] Date: 04/03/2022 Prepared by: Bradly Chris  Exercises - Seated Upper Trapezius Stretch  - 1 x daily - 3 reps - 30-60 sec hold - Sternocleidomastoid Stretch  - 1 x daily - 3 reps - 30-60 hold - Supine Lower Trunk Rotation  - 1 x daily - 3 sets - 10 reps - 3 sec  hold - TL Sidebending Stretch - Single Arm Overhead  - 1 x daily - 3 reps - 30-60 hold - Prone Quad Stretch with Strap  - 1 x daily - 3 reps - 30-60 sec hold   ASSESSMENT:   CLINICAL IMPRESSION: Pt benefiting from manual intervention to improve R cervical joint mobility, muscle flexibility and pain with f/u of therex for long term benefits. F/u with periscapular strengthening with focus on lower trap for reciprocal  inhibition to R upper trap and levator scap. Pt reports R sided neck pain relief to 1/10 NPS.  Pt will continue to benefit from skilled PT intervention to address pain in cervical spine and lumbar spine and impairments to optimize return to PLOF.    OBJECTIVE IMPAIRMENTS decreased mobility, decreased ROM, decreased strength, impaired flexibility, postural dysfunction, and pain.    ACTIVITY LIMITATIONS carrying, lifting, bending, standing, squatting, stairs, bathing, dressing, reach over head, hygiene/grooming, and caring for others   PARTICIPATION LIMITATIONS: meal prep, cleaning, laundry, shopping, community activity, and yard work   PERSONAL FACTORS Age, Past/current experiences, Time since onset of injury/illness/exacerbation, and 3+ comorbidities: T2DM, Migraines, History of Kidney Stones   are also affecting patient's functional outcome.    REHAB POTENTIAL: Good   CLINICAL DECISION MAKING: Stable/uncomplicated   EVALUATION COMPLEXITY: Low     GOALS: Goals reviewed with patient? No   SHORT TERM GOALS: Target date: 04/03/2022  Pt will be independent with HEP in order to improve strength and balance in order to decrease fall risk and improve function at home and work. Baseline: Working on independently  Goal status: Ongoing      LONG TERM GOALS: Target date: 05/29/2022    Patient will have improved function and activity level as evidenced by an increase in FOTO score by 10 points or more.  Baseline: 53/100 with target of 61  Goal status: Ongoing    2.  Patient will increase cervical ROM with side bending and rotation for improved cervical mobility and to decrease muscular tension to resolve headaches.  Baseline: Cervical R/L AROM Rot 20/20, R/L Side Bending PROM and AROM 20/25  Goal status: Ongoing    3.  Patient will improve hip and parascapular strength by  2/3 grade (I.E 4- to 4+) for improved hip stability to offload spinal structures to decrease low back pain.  Baseline:  Hip Add R/L 4-/4- , Hip Abd R/L 4+,4+, Hip Ext R/L 4-/4- Hip IR R/L 4/4, Hip ER R/L 4/4, Shoulder Ext R/L 4-/4-, Mid Trap R/L 4-/4-, Lower Trap 4-/4-   Goal status:Ongoing    PLAN: PT FREQUENCY: 1-2x/week   PT DURATION: 10 weeks   PLANNED INTERVENTIONS: Therapeutic exercises, Neuromuscular re-education, Balance training, Gait training, Patient/Family education, Self Care, Joint mobilization, Joint manipulation, Stair training, Vestibular training, Canalith repositioning, DME instructions, Aquatic Therapy, Dry Needling, Electrical stimulation, Spinal manipulation, Spinal mobilization, Cryotherapy, Moist heat, Manual therapy, and Re-evaluation   PLAN FOR NEXT SESSION: Cervical  limitations and lumbar limitations. Minto Fairly IV, PT, DPT Physical Therapist- [facility]  05/25/2022, 2:17 PM

## 2022-05-26 DIAGNOSIS — G43019 Migraine without aura, intractable, without status migrainosus: Secondary | ICD-10-CM | POA: Diagnosis not present

## 2022-05-26 DIAGNOSIS — G250 Essential tremor: Secondary | ICD-10-CM | POA: Diagnosis not present

## 2022-05-26 DIAGNOSIS — G4733 Obstructive sleep apnea (adult) (pediatric): Secondary | ICD-10-CM | POA: Diagnosis not present

## 2022-05-29 ENCOUNTER — Other Ambulatory Visit: Payer: Self-pay | Admitting: Student

## 2022-05-29 DIAGNOSIS — G43019 Migraine without aura, intractable, without status migrainosus: Secondary | ICD-10-CM

## 2022-05-29 DIAGNOSIS — G250 Essential tremor: Secondary | ICD-10-CM

## 2022-05-30 ENCOUNTER — Ambulatory Visit: Payer: Medicare Other

## 2022-05-30 ENCOUNTER — Encounter: Payer: Self-pay | Admitting: Physical Therapy

## 2022-05-30 DIAGNOSIS — M5459 Other low back pain: Secondary | ICD-10-CM | POA: Diagnosis not present

## 2022-05-30 DIAGNOSIS — M542 Cervicalgia: Secondary | ICD-10-CM | POA: Diagnosis not present

## 2022-05-30 NOTE — Therapy (Signed)
OUTPATIENT PHYSICAL THERAPY TREATMENT NOTE   Patient Name: Brandi Mahoney MRN: [id number] DOB:[birth year], 66 y.o., female Today's Date: 05/30/2022  PCP: Dr. Lorelei Pont  REFERRING PROVIDER: Dr. Lorelei Pont   END OF SESSION:   PT End of Session - 05/30/22 1019     Visit Number 6    Number of Visits 20    Date for PT Re-Evaluation 05/29/22    Authorization Type [employer] Medicare    Authorization Time Period 03/20/22-05/29/22    Authorization - Visit Number 6    Authorization - Number of Visits 20    Progress Note Due on Visit 10    PT Start Time 1016    PT Stop Time 1100    PT Time Calculation (min) 44 min    Activity Tolerance Patient tolerated treatment well    Behavior During Therapy WFL for tasks assessed/performed              Past Medical History:  Diagnosis Date   CAD (coronary artery disease)    a. 01/2010 Cath: mild atherosclerosis in the mid LCX after takeoff of OM1, mild diff LAD dzs;  b. 07/2013 MV: EF 67%, no ischemia.   GAD (generalized anxiety disorder)    History of kidney stones    HLD (hyperlipidemia)    Hypothyroidism    Major depressive disorder, recurrent episode, in full remission ([place]) 09/08/2008   Qualifier: Diagnosis of  By: Lorelei Pont MD, [place]     Migraine headache    Myocarditis ([place])    a. 01/2010 presented w/ c/p and elev trop-->Cath: mild atherosclerosis in the mid LCX after takeoff of OM1, mild diff LAD dzs;  b. 03/2010 Echo: EF 60-65%, DD, mild LVH;  c. 07/2011 Echo: EF 55-60%, mild LVH, no rwma.   Sleep apnea    has c-pap.  wears off and on - not lately  using  per reported 07-29-2020   Type 2 diabetes, diet controlled [facility])    Past Surgical History:  Procedure Laterality Date   APPENDECTOMY  1974   BREAST SURGERY Right 1988   fatty tumore from breast   CARDIAC CATHETERIZATION  8/11   no significant -[facility] Dr Rockey Situ,   CHOLECYSTECTOMY  2002   COLONOSCOPY  2017   CYST EXCISION     ovarian cyst 1994   NEPHROLITHOTOMY Right 08/20/2014    Procedure: RIGHT PERCUTANEOUS NEPHROLITHOTOMY ;  Surgeon: Malka So, MD;  Location: [facility];  Service: Urology;  Laterality: Right;   POLYPECTOMY     RECTAL EXAM UNDER ANESTHESIA N/A 08/02/2020   Procedure: Darci Needle EXAM UNDER ANESTHESIA INJECTION OF PERIANAL BOTOX;  Surgeon: Ileana Roup, MD;  Location: [place];  Service: General;  Laterality: N/A;   TOTAL ABDOMINAL HYSTERECTOMY W/ BILATERAL SALPINGOOPHORECTOMY  2000   no ovaries, uterus, or cervix.   Patient Active Problem List   Diagnosis Date Noted   Lichen sclerosus 54/00/8676   Coronary artery disease involving native coronary artery of native heart with angina pectoris (HCC)    Type 2 diabetes, diet controlled ([place])    GAD (generalized anxiety disorder)    Obstructive sleep apnea 09/25/2013   Tobacco abuse, in remission 09/19/2013   IBS (irritable bowel syndrome) 09/26/2012   Mixed hyperlipidemia 04/01/2010   Viral myocarditis, 01/2010 01/27/2010   HYPERTRIGLYCERIDEMIA 07/21/2009   Hypothyroidism in adult 09/08/2008   Major depressive disorder, recurrent episode, in full remission ([place]) 09/08/2008   ASTHMA 09/08/2008    REFERRING DIAG: Cervicalgia and right sided low back pain  THERAPY DIAG:  Cervicalgia  Other low back pain  Rationale for Evaluation and Treatment Rehabilitation  PERTINENT HISTORY: Pt reports having a bad back forever and she went to chiropractor to get examined. He explained that he thought it was a bulging discs and told her not to do activity. She also describes having neck pain and limited ROM especially with most of pain on right side. She thinks this might have happened when falling ten years ago and landing on her face.   PRECAUTIONS: None   SUBJECTIVE: Pt reports 5-6/10 in LBP and cervical pain. Had a lot of relief after last session that lingered into that following day.   PAIN:  Are you having pain? Yes: NPRS scale: 5-6/10 Pain location: Right upper trap and low  back  Pain description: Achy  Aggravating factors: Turning neck or back will suddenly lock up  Relieving factors: Not moving neck or low back    OBJECTIVE: (objective measures completed at initial evaluation unless otherwise dated)       VITALS: BP 129/59 HR 70 Sp02 95   DIAGNOSTIC FINDINGS: None listed    PATIENT SURVEYS:  FOTO 53/100 with target of 66    COGNITION:           Overall cognitive status: Within functional limits for tasks assessed                          SENSATION: WFL   MUSCLE LENGTH: Hamstrings: Right NT deg; Left NT  deg Thomas test: Right NT deg; Left NT deg   POSTURE: rounded shoulders and forward head   PALPATION: Right paraspinal    LOWER EXTREMITY ROM:           Lumbar ROM:                                               Flexion: 100%                                               Extension:100%                                               SB R/L: 85%*/100%                                               Rot R/L: 85%*/100%                                           *=painful     Active  Right 04/03/2022 Left 04/03/2022  Hip flexion 120 120  Hip extension 30 30  Hip abduction 45 45  Hip adduction 30 30  Hip internal rotation 45 45  Hip external rotation 45 45  Knee flexion 135 135  Knee extension 0 0  Ankle dorsiflexion 20 20  Ankle plantarflexion 50  50  Ankle inversion 35 35  Ankle eversion 15 15   (Blank rows = not tested)                                                                                           Cervical                                                                                                    [place]                               AROM                     PROM                                              Flex    45                                  50                                                                            Ext      45                                  45                       Lat Side Bend R/L  45/45                            20/25                           20/25                        Rotation       R/L     60/60  20/20                           45/45       Active ROM Right eval Left eval  Shoulder flexion 180 180  Shoulder extension 60 60  Shoulder abduction 180  180  Shoulder adduction      Shoulder internal rotation 70 70  Shoulder external rotation 90 90  Elbow flexion 150 150  Elbow extension 60 60  Wrist flexion 80 80  Wrist extension 70 70  Wrist ulnar deviation 30 30  Wrist radial deviation 20 20  Wrist pronation      Wrist supination      (Blank rows = not tested)   Lower extremity AROM   Hip flexion R/L: 109/110  Hip IR R/L: 19/25 Hip ER R/L:40/42              UPPER AND LOWER EXTREMITY MMT:                          MMT Right  eval  Left eval  Shoulder flexion 5/5 5/5  Shoulder extension  4-/5  4-/5   Shoulder abduction 5/5 5/5  Shoulder adduction      Shoulder internal rotation  4/5 4/5   Shoulder external rotation  4/5  4/5  Elbow flexion 5/5 5/5  Elbow extension 5/5 5/5  Wrist flexion 5/5 5/5  Wrist extension 5/5 5/5  Wrist ulnar deviation      Wrist radial deviation      Wrist pronation      Wrist supination          MMT Right eval Left eval  Hip flexion 5/5  5/5  Hip extension 4/5 4/5  Hip abduction 4+/5 4+/5  Hip adduction  4-/5 4-/5  Hip internal rotation  4/5  4/5  Hip external rotation  4/5  4/5  Knee flexion  5/5 5/5  Knee extension  5/5 5/5  Ankle dorsiflexion 5/5 5/5  Ankle plantarflexion      Ankle inversion      Ankle eversion       (Blank rows = not tested)   LOWER EXTREMITY SPECIAL TESTS:   Deferred, will complete next session  Hip special tests: Saralyn Pilar (FABER) test: NT , FADIR: NT, Trendelenburg test: NT, Thomas test: + Bilateral, Ober's test: negative, Ely's test: + Right side, and SI compression test: NT     FUNCTIONAL TESTS:  5 times sit to stand: NT  30 seconds chair  stand test: NT  6 minute walk test: NT  10 meter walk test: NT    GAIT: Distance walked: 50 ft  Assistive device utilized: None Level of assistance: Complete Independence Comments: No gait abnormalities noted        TODAY'S TREATMENT:  05/30/22:  There.ex: Nu-Step L4 for 5 minutes for gentle UE/LE warm up and spinal mobility  Seated Upper trap stretch: 3x30 sec bilat  Seated levator scap stretch: 2x30 sec bilat. Min multimodal cues for form/technique.   Seated cervical extension SNAGS: x20  Seated cervical rotation SNAGS: x12. Max TC's on shoulder to prevent thoracic rotation.  Seated physioball roll outs:   Forwards and R/L lateral deviations: x12/direction  Hook lying:   LTR's   Overhead AAROM shoulder flexion to address thoracic extension:    B shoulder extension low trap row: RTB, 1x15  OMEGA rows: 15#, 1x20     PATIENT EDUCATION:  Education details: form and technique for appropriate exercise and explanation about underlying deficits  Person educated: Patient Education method: Explanation, Demonstration, Tactile cues, Verbal cues, and Handouts Education comprehension: verbalized understanding, returned demonstration, verbal cues required, and tactile cues required     HOME EXERCISE PROGRAM: Access Code: [email or web address] URL: [email or web address] Date: 04/03/2022 Prepared by: Bradly Chris  Exercises - Seated Upper Trapezius Stretch  - 1 x daily - 3 reps - 30-60 sec hold - Sternocleidomastoid Stretch  - 1 x daily - 3 reps - 30-60 hold - Supine Lower Trunk Rotation  - 1 x daily - 3 sets - 10 reps - 3 sec  hold - TL Sidebending Stretch - Single Arm Overhead  - 1 x daily - 3 reps - 30-60 hold - Prone Quad Stretch with Strap  - 1 x daily - 3 reps - 30-60 sec hold   ASSESSMENT:   CLINICAL IMPRESSION: Focus of POC this date to improve lumbar and cervical pain. Min to mod multi modal cuing needed for form/technique to targeted joints and  musculature. Good carryover appreciated. Pt reports improvement in symptoms from cervical spine from 6/10 NPS to 4/10 NPS and low back form 5/10 NPS to 3/10 NPS. Pt will continue to benefit from skilled PT intervention to address pain in cervical spine and lumbar spine and impairments to optimize return to PLOF.     OBJECTIVE IMPAIRMENTS decreased mobility, decreased ROM, decreased strength, impaired flexibility, postural dysfunction, and pain.    ACTIVITY LIMITATIONS carrying, lifting, bending, standing, squatting, stairs, bathing, dressing, reach over head, hygiene/grooming, and caring for others   PARTICIPATION LIMITATIONS: meal prep, cleaning, laundry, shopping, community activity, and yard work   PERSONAL FACTORS Age, Past/current experiences, Time since onset of injury/illness/exacerbation, and 3+ comorbidities: T2DM, Migraines, History of Kidney Stones   are also affecting patient's functional outcome.    REHAB POTENTIAL: Good   CLINICAL DECISION MAKING: Stable/uncomplicated   EVALUATION COMPLEXITY: Low     GOALS: Goals reviewed with patient? No   SHORT TERM GOALS: Target date: 04/03/2022  Pt will be independent with HEP in order to improve strength and balance in order to decrease fall risk and improve function at home and work. Baseline: Working on independently  Goal status: Ongoing      LONG TERM GOALS: Target date: 05/29/2022    Patient will have improved function and activity level as evidenced by an increase in FOTO score by 10 points or more.  Baseline: 53/100 with target of 61  Goal status: Ongoing    2.  Patient will increase cervical ROM with side bending and rotation for improved cervical mobility and to decrease muscular tension to resolve headaches.  Baseline: Cervical R/L AROM Rot 20/20, R/L Side Bending PROM and AROM 20/25  Goal status: Ongoing    3.  Patient will improve hip and parascapular strength by  2/3 grade (I.E 4- to 4+) for improved hip  stability to offload spinal structures to decrease low back pain.  Baseline: Hip Add R/L 4-/4- , Hip Abd R/L 4+,4+, Hip Ext R/L 4-/4- Hip IR R/L 4/4, Hip ER R/L 4/4, Shoulder Ext R/L 4-/4-, Mid Trap R/L 4-/4-, Lower Trap 4-/4-   Goal status:Ongoing    PLAN: PT FREQUENCY: 1-2x/week   PT DURATION: 10 weeks   PLANNED INTERVENTIONS: Therapeutic exercises, Neuromuscular re-education, Balance training, Gait training, Patient/Family education, Self Care, Joint mobilization, Joint manipulation, Stair training, Vestibular training, Canalith repositioning, DME instructions, Aquatic Therapy, Dry Needling, Electrical stimulation, Spinal manipulation, Spinal mobilization, Cryotherapy,  Moist heat, Manual therapy, and Re-evaluation   PLAN FOR NEXT SESSION: Cervical limitations and lumbar limitations. [place] Fairly IV, PT, DPT Physical Therapist- [facility]  05/30/2022, 11:13 AM

## 2022-06-01 ENCOUNTER — Ambulatory Visit: Payer: Medicare Other

## 2022-06-01 ENCOUNTER — Encounter: Payer: Self-pay | Admitting: Physical Therapy

## 2022-06-01 DIAGNOSIS — M542 Cervicalgia: Secondary | ICD-10-CM | POA: Diagnosis not present

## 2022-06-01 DIAGNOSIS — M5459 Other low back pain: Secondary | ICD-10-CM

## 2022-06-01 NOTE — Therapy (Signed)
OUTPATIENT PHYSICAL THERAPY TREATMENT NOTE   Patient Name: Brandi Mahoney MRN: [id number] DOB:[birth year], 66 y.o., female Today's Date: 06/01/2022  PCP: Dr. Lorelei Pont  REFERRING PROVIDER: Dr. Lorelei Pont   END OF SESSION:   PT End of Session - 06/01/22 0939     Visit Number 7    Number of Visits 20    Date for PT Re-Evaluation 05/29/22    Authorization Type [employer] Medicare    Authorization Time Period 03/20/22-05/29/22    Authorization - Visit Number 7    Authorization - Number of Visits 20    Progress Note Due on Visit 10    PT Start Time 0935    PT Stop Time 1014    PT Time Calculation (min) 39 min    Activity Tolerance Patient tolerated treatment well    Behavior During Therapy [facility] for tasks assessed/performed              Past Medical History:  Diagnosis Date   CAD (coronary artery disease)    a. 01/2010 Cath: mild atherosclerosis in the mid LCX after takeoff of OM1, mild diff LAD dzs;  b. 07/2013 MV: EF 67%, no ischemia.   GAD (generalized anxiety disorder)    History of kidney stones    HLD (hyperlipidemia)    Hypothyroidism    Major depressive disorder, recurrent episode, in full remission ([place]) 09/08/2008   Qualifier: Diagnosis of  By: Lorelei Pont MD, [place]     Migraine headache    Myocarditis ([place])    a. 01/2010 presented w/ c/p and elev trop-->Cath: mild atherosclerosis in the mid LCX after takeoff of OM1, mild diff LAD dzs;  b. 03/2010 Echo: EF 60-65%, DD, mild LVH;  c. 07/2011 Echo: EF 55-60%, mild LVH, no rwma.   Sleep apnea    has c-pap.  wears off and on - not lately  using  per reported 07-29-2020   Type 2 diabetes, diet controlled [facility])    Past Surgical History:  Procedure Laterality Date   APPENDECTOMY  1974   BREAST SURGERY Right 1988   fatty tumore from breast   CARDIAC CATHETERIZATION  8/11   no [facility] Dr Rockey Situ,   CHOLECYSTECTOMY  2002   COLONOSCOPY  2017   CYST EXCISION     ovarian cyst 1994   NEPHROLITHOTOMY Right 08/20/2014    Procedure: RIGHT PERCUTANEOUS NEPHROLITHOTOMY ;  Surgeon: Malka So, MD;  Location: [facility];  Service: Urology;  Laterality: Right;   POLYPECTOMY     RECTAL EXAM UNDER ANESTHESIA N/A 08/02/2020   Procedure: Darci Needle EXAM UNDER ANESTHESIA INJECTION OF PERIANAL BOTOX;  Surgeon: Ileana Roup, MD;  Location: [facility];  Service: General;  Laterality: N/A;   TOTAL ABDOMINAL HYSTERECTOMY W/ BILATERAL SALPINGOOPHORECTOMY  2000   no ovaries, uterus, or cervix.   Patient Active Problem List   Diagnosis Date Noted   Lichen sclerosus 67/34/1937   Coronary artery disease involving native coronary artery of native heart with angina pectoris (HCC)    Type 2 diabetes, diet controlled ([place])    GAD (generalized anxiety disorder)    Obstructive sleep apnea 09/25/2013   Tobacco abuse, in remission 09/19/2013   IBS (irritable bowel syndrome) 09/26/2012   Mixed hyperlipidemia 04/01/2010   Viral myocarditis, 01/2010 01/27/2010   HYPERTRIGLYCERIDEMIA 07/21/2009   Hypothyroidism in adult 09/08/2008   Major depressive disorder, recurrent episode, in full remission ([place]) 09/08/2008   ASTHMA 09/08/2008    REFERRING DIAG: Cervicalgia and right sided low back pain  THERAPY DIAG:  Cervicalgia  Other low back pain  Rationale for Evaluation and Treatment Rehabilitation  PERTINENT HISTORY: Pt reports having a bad back forever and she went to chiropractor to get examined. He explained that he thought it was a bulging discs and told her not to do activity. She also describes having neck pain and limited ROM especially with most of pain on right side. She thinks this might have happened when falling ten years ago and landing on her face.   PRECAUTIONS: None   SUBJECTIVE: Pt reports 3/10 NPS in lower back. Pain in cervical spine is 5/10 NPS. Reports stiffness in her neck and back.  PAIN:  Are you having pain? Yes: NPRS scale: 5-6/10 Pain location: Right upper trap and low back  Pain  description: Achy  Aggravating factors: Turning neck or back will suddenly lock up  Relieving factors: Not moving neck or low back    OBJECTIVE: (objective measures completed at initial evaluation unless otherwise dated)       VITALS: BP 129/59 HR 70 Sp02 95   DIAGNOSTIC FINDINGS: None listed    PATIENT SURVEYS:  FOTO 53/100 with target of 76    COGNITION:           Overall cognitive status: Within functional limits for tasks assessed                          SENSATION: WFL   MUSCLE LENGTH: Hamstrings: Right NT deg; Left NT  deg Thomas test: Right NT deg; Left NT deg   POSTURE: rounded shoulders and forward head   PALPATION: Right paraspinal    LOWER EXTREMITY ROM:           Lumbar ROM:                                               Flexion: 100%                                               Extension:100%                                               SB R/L: 85%*/100%                                               Rot R/L: 85%*/100%                                           *=painful     Active  Right 04/03/2022 Left 04/03/2022  Hip flexion 120 120  Hip extension 30 30  Hip abduction 45 45  Hip adduction 30 30  Hip internal rotation 45 45  Hip external rotation 45 45  Knee flexion 135 135  Knee extension 0 0  Ankle dorsiflexion 20 20  Ankle plantarflexion 50 50  Ankle inversion 35 35  Ankle eversion 15 15   (Blank rows = not tested)                                                                                           Cervical                                                                                                    [place]                               AROM                     PROM                                              Flex    45                                  50                                                                            Ext      45                                  45                       Lat Side Bend R/L 45/45                             20/25                           20/25                        Rotation       R/L     60/60  20/20                           45/45       Active ROM Right eval Left eval  Shoulder flexion 180 180  Shoulder extension 60 60  Shoulder abduction 180  180  Shoulder adduction      Shoulder internal rotation 70 70  Shoulder external rotation 90 90  Elbow flexion 150 150  Elbow extension 60 60  Wrist flexion 80 80  Wrist extension 70 70  Wrist ulnar deviation 30 30  Wrist radial deviation 20 20  Wrist pronation      Wrist supination      (Blank rows = not tested)   Lower extremity AROM   Hip flexion R/L: 109/110  Hip IR R/L: 19/25 Hip ER R/L:40/42              UPPER AND LOWER EXTREMITY MMT:                          MMT Right  eval  Left eval  Shoulder flexion 5/5 5/5  Shoulder extension  4-/5  4-/5   Shoulder abduction 5/5 5/5  Shoulder adduction      Shoulder internal rotation  4/5 4/5   Shoulder external rotation  4/5  4/5  Elbow flexion 5/5 5/5  Elbow extension 5/5 5/5  Wrist flexion 5/5 5/5  Wrist extension 5/5 5/5  Wrist ulnar deviation      Wrist radial deviation      Wrist pronation      Wrist supination          MMT Right eval Left eval  Hip flexion 5/5  5/5  Hip extension 4/5 4/5  Hip abduction 4+/5 4+/5  Hip adduction  4-/5 4-/5  Hip internal rotation  4/5  4/5  Hip external rotation  4/5  4/5  Knee flexion  5/5 5/5  Knee extension  5/5 5/5  Ankle dorsiflexion 5/5 5/5  Ankle plantarflexion      Ankle inversion      Ankle eversion       (Blank rows = not tested)   LOWER EXTREMITY SPECIAL TESTS:   Deferred, will complete next session  Hip special tests: Saralyn Pilar (FABER) test: NT , FADIR: NT, Trendelenburg test: NT, Thomas test: + Bilateral, Ober's test: negative, Ely's test: + Right side, and SI compression test: NT     FUNCTIONAL TESTS:  5 times sit to stand: NT  30 seconds chair stand test:  NT  6 minute walk test: NT  10 meter walk test: NT    GAIT: Distance walked: 50 ft  Assistive device utilized: None Level of assistance: Complete Independence Comments: No gait abnormalities noted        TODAY'S TREATMENT:  06/01/22:  There.ex:  Lumbar physioball roll outs: forwards/R and L lateral  deviations.  X20/direction.  Seated Upper trap stretch: 2x30 sec bilat  Seated levator scap stretch: 2x30 sec bilat. Min multimodal cues for form/technique.   Seated cervical extension SNAGS: 2x12  Seated cervical rotation SNAGS: 2x12. Max TC's on shoulder to prevent thoracic rotation.  Seated Overhead AAROM shoulder flexion to address thoracic extension: 2x20  Seated russian twist: 3 KG med ball, 2x8/side. PT demo with good carryover after cues.   B shoulder extension low trap row:  Blue TB, 3x8   OMEGA rows: 20#, 2x20   Back pain 2/10 NPS. Cervical  pain 3/10 NPS post session.     PATIENT EDUCATION:  Education details: form and technique for appropriate exercise and explanation about underlying deficits  Person educated: Patient Education method: Explanation, Demonstration, Tactile cues, Verbal cues, and Handouts Education comprehension: verbalized understanding, returned demonstration, verbal cues required, and tactile cues required     HOME EXERCISE PROGRAM: Access Code: [email or web address] URL: [email or web address] Date: 04/03/2022 Prepared by: Bradly Chris  Exercises - Seated Upper Trapezius Stretch  - 1 x daily - 3 reps - 30-60 sec hold - Sternocleidomastoid Stretch  - 1 x daily - 3 reps - 30-60 hold - Supine Lower Trunk Rotation  - 1 x daily - 3 sets - 10 reps - 3 sec  hold - TL Sidebending Stretch - Single Arm Overhead  - 1 x daily - 3 reps - 30-60 hold - Prone Quad Stretch with Strap  - 1 x daily - 3 reps - 30-60 sec hold   ASSESSMENT:   CLINICAL IMPRESSION: Continuing PT POC focusing on cervical and lumbar ranges of motion. Along with  periscapular strengthening. Pt progressing in  resistance with periscapular strengthening without onset of symptoms. Pt  improving in decreased pain levels post session  in lower back from 3/10 to 2/10 NPS and cervical pain from 5/10 NPS to 3/10 NPS. Encouraged pt to perform cervical SNAGS to address cervical limitations. Pt will continue to benefit from skilled PT intervention to address pain in cervical spine and lumbar spine and impairments to optimize return to PLOF.     OBJECTIVE IMPAIRMENTS decreased mobility, decreased ROM, decreased strength, impaired flexibility, postural dysfunction, and pain.    ACTIVITY LIMITATIONS carrying, lifting, bending, standing, squatting, stairs, bathing, dressing, reach over head, hygiene/grooming, and caring for others   PARTICIPATION LIMITATIONS: meal prep, cleaning, laundry, shopping, community activity, and yard work   PERSONAL FACTORS Age, Past/current experiences, Time since onset of injury/illness/exacerbation, and 3+ comorbidities: T2DM, Migraines, History of Kidney Stones   are also affecting patient's functional outcome.    REHAB POTENTIAL: Good   CLINICAL DECISION MAKING: Stable/uncomplicated   EVALUATION COMPLEXITY: Low     GOALS: Goals reviewed with patient? No   SHORT TERM GOALS: Target date: 04/03/2022  Pt will be independent with HEP in order to improve strength and balance in order to decrease fall risk and improve function at home and work. Baseline: Working on independently  Goal status: Ongoing      LONG TERM GOALS: Target date: 05/29/2022    Patient will have improved function and activity level as evidenced by an increase in FOTO score by 10 points or more.  Baseline: 53/100 with target of 61  Goal status: Ongoing    2.  Patient will increase cervical ROM with side bending and rotation for improved cervical mobility and to decrease muscular tension to resolve headaches.  Baseline: Cervical R/L AROM Rot 20/20, R/L Side  Bending PROM and AROM 20/25  Goal status: Ongoing    3.  Patient will improve hip and parascapular strength by  2/3 grade (I.E 4- to 4+) for improved hip stability to offload spinal structures to decrease low back pain.  Baseline: Hip Add R/L 4-/4- , Hip Abd R/L 4+,4+, Hip Ext R/L 4-/4- Hip IR R/L 4/4, Hip ER R/L 4/4, Shoulder Ext R/L 4-/4-, Mid Trap R/L 4-/4-, Lower Trap 4-/4-   Goal status:Ongoing    PLAN: PT FREQUENCY: 1-2x/week   PT DURATION: 10 weeks   PLANNED INTERVENTIONS: Therapeutic exercises, Neuromuscular re-education, Balance training, Gait training, Patient/Family education,  Self Care, Joint mobilization, Joint manipulation, Stair training, Vestibular training, Canalith repositioning, DME instructions, Aquatic Therapy, Dry Needling, Electrical stimulation, Spinal manipulation, Spinal mobilization, Cryotherapy, Moist heat, Manual therapy, and Re-evaluation   PLAN FOR NEXT SESSION: Cervical limitations and lumbar limitations. Barry Fairly IV, PT, DPT Physical Therapist- [facility]  06/01/2022, 10:24 AM

## 2022-06-05 ENCOUNTER — Ambulatory Visit: Payer: Medicare Other | Admitting: Physical Therapy

## 2022-06-08 ENCOUNTER — Ambulatory Visit: Payer: Medicare Other | Admitting: Physical Therapy

## 2022-06-08 DIAGNOSIS — M5459 Other low back pain: Secondary | ICD-10-CM

## 2022-06-08 DIAGNOSIS — M542 Cervicalgia: Secondary | ICD-10-CM

## 2022-06-08 DIAGNOSIS — L900 Lichen sclerosus et atrophicus: Secondary | ICD-10-CM | POA: Diagnosis not present

## 2022-06-08 NOTE — Therapy (Addendum)
OUTPATIENT PHYSICAL THERAPY TREATMENT NOTE/ Discharge   Discharge Summary: Patient called back to notify clinic that she would like referral to ortho on 07/24/22. At this point, writer is assuming that patient is seeking out additional treatment for ongoing deficits. Instructed pt to reach out to PCP for referral to orthopedist for further eval and treatment.   Patient Name: Brandi Mahoney MRN: [id number] DOB:[birth year], 66 y.o., female Today's Date: 06/01/2022  PCP: Dr. Lorelei Pont  REFERRING PROVIDER: Dr. Lorelei Pont   END OF SESSION:   PT End of Session - 06/01/22 0939     Visit Number 7    Number of Visits 20    Date for PT Re-Evaluation 05/29/22    Authorization Type [employer] Medicare    Authorization Time Period 03/20/22-05/29/22    Authorization - Visit Number 7    Authorization - Number of Visits 20    Progress Note Due on Visit 10    PT Start Time 0935    PT Stop Time 1014    PT Time Calculation (min) 39 min    Activity Tolerance Patient tolerated treatment well    Behavior During Therapy [facility] for tasks assessed/performed              Past Medical History:  Diagnosis Date   CAD (coronary artery disease)    a. 01/2010 Cath: mild atherosclerosis in the mid LCX after takeoff of OM1, mild diff LAD dzs;  b. 07/2013 MV: EF 67%, no ischemia.   GAD (generalized anxiety disorder)    History of kidney stones    HLD (hyperlipidemia)    Hypothyroidism    Major depressive disorder, recurrent episode, in full remission ([place]) 09/08/2008   Qualifier: Diagnosis of  By: Lorelei Pont MD, [place]     Migraine headache    Myocarditis ([place])    a. 01/2010 presented w/ c/p and elev trop-->Cath: mild atherosclerosis in the mid LCX after takeoff of OM1, mild diff LAD dzs;  b. 03/2010 Echo: EF 60-65%, DD, mild LVH;  c. 07/2011 Echo: EF 55-60%, mild LVH, no rwma.   Sleep apnea    has c-pap.  wears off and on - not lately  using  per reported 07-29-2020   Type 2 diabetes, diet controlled [facility])    Past  Surgical History:  Procedure Laterality Date   APPENDECTOMY  1974   BREAST SURGERY Right 1988   fatty tumore from breast   CARDIAC CATHETERIZATION  8/11   no significant [facility] Dr Rockey Situ,   CHOLECYSTECTOMY  2002   COLONOSCOPY  2017   CYST EXCISION     ovarian cyst 1994   NEPHROLITHOTOMY Right 08/20/2014   Procedure: RIGHT PERCUTANEOUS NEPHROLITHOTOMY ;  Surgeon: Malka So, MD;  Location: [facility];  Service: Urology;  Laterality: Right;   POLYPECTOMY     RECTAL EXAM UNDER ANESTHESIA N/A 08/02/2020   Procedure: Darci Needle EXAM UNDER ANESTHESIA INJECTION OF PERIANAL BOTOX;  Surgeon: Ileana Roup, MD;  Location: [place];  Service: General;  Laterality: N/A;   TOTAL ABDOMINAL HYSTERECTOMY W/ BILATERAL SALPINGOOPHORECTOMY  2000   no ovaries, uterus, or cervix.   Patient Active Problem List   Diagnosis Date Noted   Lichen sclerosus 42/59/5638   Coronary artery disease involving native coronary artery of native heart with angina pectoris (HCC)    Type 2 diabetes, diet controlled ([place])    GAD (generalized anxiety disorder)    Obstructive sleep apnea 09/25/2013   Tobacco abuse, in remission 09/19/2013   IBS (irritable bowel  syndrome) 09/26/2012   Mixed hyperlipidemia 04/01/2010   Viral myocarditis, 01/2010 01/27/2010   HYPERTRIGLYCERIDEMIA 07/21/2009   Hypothyroidism in adult 09/08/2008   Major depressive disorder, recurrent episode, in full remission ([place]) 09/08/2008   ASTHMA 09/08/2008    REFERRING DIAG: Cervicalgia and right sided low back pain   THERAPY DIAG:  Cervicalgia  Other low back pain  Rationale for Evaluation and Treatment Rehabilitation  PERTINENT HISTORY: Pt reports having a bad back forever and she went to chiropractor to get examined. He explained that he thought it was a bulging discs and told her not to do activity. She also describes having neck pain and limited ROM especially with most of pain on right side. She thinks this might have  happened when falling ten years ago and landing on her face.   PRECAUTIONS: None   SUBJECTIVE: Pt reports increased neck pain with increased tension on the right. She felt a lot of pain after last session having performed the cervical SNAGs.    PAIN:  Are you having pain? Yes: NPRS scale: 5/10 Pain location: Right upper trap and low back  Pain description: Achy  Aggravating factors: Turning neck or back will suddenly lock up  Relieving factors: Not moving neck or low back    OBJECTIVE: (objective measures completed at initial evaluation unless otherwise dated)       VITALS: BP 129/59 HR 70 Sp02 95   DIAGNOSTIC FINDINGS: None listed    PATIENT SURVEYS:  FOTO 53/100 with target of 61 06/08/22:56    COGNITION:           Overall cognitive status: Within functional limits for tasks assessed                          SENSATION: WFL   MUSCLE LENGTH: Hamstrings: Right NT deg; Left NT  deg Thomas test: Right NT deg; Left NT deg   POSTURE: rounded shoulders and forward head   PALPATION: Right paraspinal    LOWER EXTREMITY ROM:           Lumbar ROM:                                               Flexion: 100%                                               Extension:100%                                               SB R/L: 85%*/100%                                               Rot R/L: 85%*/100%                                           *=painful  Active  Right 04/03/2022 Left 04/03/2022  Hip flexion 120 120  Hip extension 30 30  Hip abduction 45 45  Hip adduction 30 30  Hip internal rotation 45 45  Hip external rotation 45 45  Knee flexion 135 135  Knee extension 0 0  Ankle dorsiflexion 20 20  Ankle plantarflexion 50 50  Ankle inversion 35 35  Ankle eversion 15 15   (Blank rows = not tested)                                                                                           Cervical                                                                                                     [place]                               AROM                     PROM                                              Flex    45                                  50                                                                            Ext      45                                  45                       Lat Side Bend R/L 45/45                            20/25  20/25                        Rotation       R/L     60/60                           20/20                           45/45       Active ROM Right eval Left eval  Shoulder flexion 180 180  Shoulder extension 60 60  Shoulder abduction 180  180  Shoulder adduction      Shoulder internal rotation 70 70  Shoulder external rotation 90 90  Elbow flexion 150 150  Elbow extension 60 60  Wrist flexion 80 80  Wrist extension 70 70  Wrist ulnar deviation 30 30  Wrist radial deviation 20 20  Wrist pronation      Wrist supination      (Blank rows = not tested)   Lower extremity AROM   Hip flexion R/L: 109/110  Hip IR R/L: 19/25 Hip ER R/L:40/42              UPPER AND LOWER EXTREMITY MMT:                          MMT Right  eval  Left eval  Shoulder flexion 5/5 5/5  Shoulder extension  4-/5  4-/5   Shoulder abduction 5/5 5/5  Shoulder adduction      Shoulder internal rotation  4/5 4/5   Shoulder external rotation  4/5  4/5  Elbow flexion 5/5 5/5  Elbow extension 5/5 5/5  Wrist flexion 5/5 5/5  Wrist extension 5/5 5/5  Wrist ulnar deviation      Wrist radial deviation      Wrist pronation      Wrist supination          MMT Right eval Left eval  Hip flexion 5/5  5/5  Hip extension 4/5 4/5  Hip abduction 4+/5 4+/5  Hip adduction  4-/5 4-/5  Hip internal rotation  4/5  4/5  Hip external rotation  4/5  4/5  Knee flexion  5/5 5/5  Knee extension  5/5 5/5  Ankle dorsiflexion 5/5 5/5  Ankle plantarflexion      Ankle inversion      Ankle eversion        (Blank rows = not tested)   LOWER EXTREMITY SPECIAL TESTS:   Deferred, will complete next session  Hip special tests: Saralyn Pilar (FABER) test: NT , FADIR: NT, Trendelenburg test: NT, Thomas test: + Bilateral, Ober's test: negative, Ely's test: + Right side, and SI compression test: NT     FUNCTIONAL TESTS:  5 times sit to stand: NT  30 seconds chair stand test: NT  6 minute walk test: NT  10 meter walk test: NT    GAIT: Distance walked: 50 ft  Assistive device utilized: None Level of assistance: Complete Independence Comments: No gait abnormalities noted        TODAY'S TREATMENT:  06/08/22:   MANUAL THERAPY  Trigger point release of upper trap with massage  Right upper trap stretch  Cervical traction   THEREX   Nu-Step level 6 with arms and seat with resistance level 2 for 5 min  FOTO: 56/100  Theracane use  for trigger point release   06/01/22:  There.ex:  Lumbar physioball roll outs: forwards/R and L lateral  deviations.  X20/direction.  Seated Upper trap stretch: 2x30 sec bilat  Seated levator scap stretch: 2x30 sec bilat. Min multimodal cues for form/technique.   Seated cervical extension SNAGS: 2x12  Seated cervical rotation SNAGS: 2x12. Max TC's on shoulder to prevent thoracic rotation.  Seated Overhead AAROM shoulder flexion to address thoracic extension: 2x20  Seated russian twist: 3 KG med ball, 2x8/side. PT demo with good carryover after cues.   B shoulder extension low trap row:  Blue TB, 3x8   OMEGA rows: 20#, 2x20   Back pain 2/10 NPS. Cervical pain 3/10 NPS post session.     PATIENT EDUCATION:  Education details: form and technique for appropriate exercise and explanation about underlying deficits  Person educated: Patient Education method: Explanation, Demonstration, Tactile cues, Verbal cues, and Handouts Education comprehension: verbalized understanding, returned demonstration, verbal cues required, and tactile cues required     HOME  EXERCISE PROGRAM: Access Code: [email or web address] URL: [email or web address] Date: 04/03/2022 Prepared by: Bradly Chris  Exercises - Seated Upper Trapezius Stretch  - 1 x daily - 3 reps - 30-60 sec hold - Sternocleidomastoid Stretch  - 1 x daily - 3 reps - 30-60 hold - Supine Lower Trunk Rotation  - 1 x daily - 3 sets - 10 reps - 3 sec  hold - TL Sidebending Stretch - Single Arm Overhead  - 1 x daily - 3 reps - 30-60 hold - Prone Quad Stretch with Strap  - 1 x daily - 3 reps - 30-60 sec hold   ASSESSMENT:   CLINICAL IMPRESSION:  Pt continues to have cervical pain despite ongoing PT. Soft tissue and trigger point of upper trap did decrease pain from pre to post session, but it appears as if the effect is short lived. She will continue to benefit from skilled PT with next session to focus on parascapular strengthening to reduce pain in cervical and lumbar spine to return to complete ADLs without discomfort.   OBJECTIVE IMPAIRMENTS decreased mobility, decreased ROM, decreased strength, impaired flexibility, postural dysfunction, and pain.    ACTIVITY LIMITATIONS carrying, lifting, bending, standing, squatting, stairs, bathing, dressing, reach over head, hygiene/grooming, and caring for others   PARTICIPATION LIMITATIONS: meal prep, cleaning, laundry, shopping, community activity, and yard work   PERSONAL FACTORS Age, Past/current experiences, Time since onset of injury/illness/exacerbation, and 3+ comorbidities: T2DM, Migraines, History of Kidney Stones   are also affecting patient's functional outcome.    REHAB POTENTIAL: Good   CLINICAL DECISION MAKING: Stable/uncomplicated   EVALUATION COMPLEXITY: Low     GOALS: Goals reviewed with patient? No   SHORT TERM GOALS: Target date: 04/03/2022  Pt will be independent with HEP in order to improve strength and balance in order to decrease fall risk and improve function at home and work. Baseline: Working on independently  Goal  status: Ongoing      LONG TERM GOALS: Target date: 05/29/2022    Patient will have improved function and activity level as evidenced by an increase in FOTO score by 10 points or more.  Baseline: 53/100 with target of 61 06/08/22: 56  Goal status: Ongoing    2.  Patient will increase cervical ROM with side bending and rotation for improved cervical mobility and to decrease muscular tension to resolve headaches.  Baseline: Cervical R/L AROM Rot 20/20, R/L Side Bending PROM and AROM 20/25  Goal status: Ongoing    3.  Patient will improve hip and parascapular strength by  2/3 grade (I.E 4- to 4+) for improved hip stability to offload spinal structures to decrease low back pain.  Baseline: Hip Add R/L 4-/4- , Hip Abd R/L 4+,4+, Hip Ext R/L 4-/4- Hip IR R/L 4/4, Hip ER R/L 4/4, Shoulder Ext R/L 4-/4-, Mid Trap R/L 4-/4-, Lower Trap 4-/4-   Goal status:Ongoing    PLAN: PT FREQUENCY: 1-2x/week   PT DURATION: 10 weeks   PLANNED INTERVENTIONS: Therapeutic exercises, Neuromuscular re-education, Balance training, Gait training, Patient/Family education, Self Care, Joint mobilization, Joint manipulation, Stair training, Vestibular training, Canalith repositioning, DME instructions, Aquatic Therapy, Dry Needling, Electrical stimulation, Spinal manipulation, Spinal mobilization, Cryotherapy, Moist heat, Manual therapy, and Re-evaluation   PLAN FOR NEXT SESSION: Cervical limitations and lumbar limitations. [place] PT, DPT  Physical Therapist- [facility]  06/01/2022, 10:24 AM

## 2022-06-08 NOTE — Addendum Note (Signed)
Addended by: Daneil Dan on: 06/08/2022 08:08 PM   Modules accepted: Orders

## 2022-06-10 ENCOUNTER — Ambulatory Visit
Admission: RE | Admit: 2022-06-10 | Discharge: 2022-06-10 | Disposition: A | Discharge status: Home / Self Care | Payer: Medicare Other | Source: Ambulatory Visit | Attending: Student | Admitting: Student

## 2022-06-10 DIAGNOSIS — G250 Essential tremor: Secondary | ICD-10-CM | POA: Insufficient documentation

## 2022-06-10 DIAGNOSIS — G43019 Migraine without aura, intractable, without status migrainosus: Secondary | ICD-10-CM | POA: Diagnosis not present

## 2022-06-10 DIAGNOSIS — R519 Headache, unspecified: Secondary | ICD-10-CM | POA: Diagnosis not present

## 2022-06-10 DIAGNOSIS — R251 Tremor, unspecified: Secondary | ICD-10-CM | POA: Diagnosis not present

## 2022-06-13 ENCOUNTER — Ambulatory Visit: Payer: Medicare Other | Admitting: Physical Therapy

## 2022-06-13 ENCOUNTER — Telehealth: Payer: Self-pay | Admitting: Physical Therapy

## 2022-06-13 NOTE — Telephone Encounter (Signed)
Called pt to inquire about missing apt. Pt forgot about apt and she said she would definitely be at next apt on Thursday.

## 2022-06-14 ENCOUNTER — Encounter: Payer: Medicare Other | Admitting: Physical Therapy

## 2022-06-15 ENCOUNTER — Ambulatory Visit: Payer: Medicare Other | Admitting: Physical Therapy

## 2022-06-17 ENCOUNTER — Other Ambulatory Visit: Payer: Self-pay | Admitting: Family Medicine

## 2022-07-24 ENCOUNTER — Telehealth: Payer: Self-pay | Admitting: Physical Therapy

## 2022-07-24 NOTE — Telephone Encounter (Signed)
Returned pt's call who wanted to know if she could be referred to PT. Did not reach so left VM instructing pt that she would need to reach out to PCP for a referral. This writer will discharge patient and route note to referring PCP, Dr. Lorelei Pont so that he is aware of pt's progress and request for referral.

## 2022-07-30 NOTE — Progress Notes (Signed)
    Brandi Mahoney T. Brandi Durr, MD, Brandi Mahoney at [facility] [place], [address]  Phone: [phone]  FAX: [phone]  Brandi Mahoney - 67 y.o. female  MRN [id number]  Date of Birth: 11-17-1955  Date: 07/31/2022  PCP: Brandi Loffler, MD  Referral: Brandi Loffler, MD  No chief complaint on file.  Subjective:   Brandi Mahoney is a 67 y.o. very pleasant female patient with There is no height or weight on file to calculate BMI. who presents with the following:  She is a very well-known patient, she presents with ongoing chronic neck and back pain.  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  And chronic back pain.    Review of Systems is noted in the HPI, as appropriate  Objective:   There were no vitals taken for this visit.  GEN: No acute distress; alert,appropriate. PULM: Breathing comfortably in no respiratory distress PSYCH: Normally interactive.   Laboratory and Imaging Data:  Assessment and Plan:   ***

## 2022-07-31 ENCOUNTER — Ambulatory Visit (INDEPENDENT_AMBULATORY_CARE_PROVIDER_SITE_OTHER)
Admission: RE | Admit: 2022-07-31 | Discharge: 2022-07-31 | Disposition: A | Discharge status: Home / Self Care | Payer: Medicare Other | Source: Ambulatory Visit | Attending: Family Medicine | Admitting: Family Medicine

## 2022-07-31 ENCOUNTER — Ambulatory Visit (INDEPENDENT_AMBULATORY_CARE_PROVIDER_SITE_OTHER): Payer: Medicare Other | Admitting: Family Medicine

## 2022-07-31 ENCOUNTER — Encounter: Payer: Self-pay | Admitting: Family Medicine

## 2022-07-31 VITALS — BP 130/70 | HR 70 | Temp 97.2°F | Ht 64.0 in | Wt 174.1 lb

## 2022-07-31 DIAGNOSIS — M542 Cervicalgia: Secondary | ICD-10-CM

## 2022-07-31 DIAGNOSIS — M5137 Other intervertebral disc degeneration, lumbosacral region: Secondary | ICD-10-CM

## 2022-07-31 DIAGNOSIS — M25511 Pain in right shoulder: Secondary | ICD-10-CM | POA: Diagnosis not present

## 2022-07-31 DIAGNOSIS — G8929 Other chronic pain: Secondary | ICD-10-CM | POA: Diagnosis not present

## 2022-07-31 DIAGNOSIS — M545 Low back pain, unspecified: Secondary | ICD-10-CM

## 2022-07-31 DIAGNOSIS — M5030 Other cervical disc degeneration, unspecified cervical region: Secondary | ICD-10-CM | POA: Diagnosis not present

## 2022-07-31 MED ORDER — PREDNISONE 20 MG PO TABS: ORAL_TABLET | ORAL | 0 refills | Status: DC

## 2022-07-31 MED ORDER — DIAZEPAM 5 MG PO TABS: ORAL_TABLET | ORAL | 0 refills | Status: DC

## 2022-08-01 ENCOUNTER — Encounter: Payer: Self-pay | Admitting: Family Medicine

## 2022-08-01 ENCOUNTER — Encounter: Payer: Self-pay | Admitting: *Deleted

## 2022-08-11 DIAGNOSIS — H524 Presbyopia: Secondary | ICD-10-CM | POA: Diagnosis not present

## 2022-08-11 DIAGNOSIS — H52223 Regular astigmatism, bilateral: Secondary | ICD-10-CM | POA: Diagnosis not present

## 2022-08-11 DIAGNOSIS — H5203 Hypermetropia, bilateral: Secondary | ICD-10-CM | POA: Diagnosis not present

## 2022-08-11 DIAGNOSIS — H2513 Age-related nuclear cataract, bilateral: Secondary | ICD-10-CM | POA: Diagnosis not present

## 2022-08-11 LAB — HM DIABETES EYE EXAM

## 2022-08-15 ENCOUNTER — Ambulatory Visit: Payer: Medicare Other | Admitting: Gastroenterology

## 2022-08-17 ENCOUNTER — Ambulatory Visit
Admission: RE | Admit: 2022-08-17 | Discharge: 2022-08-17 | Disposition: A | Discharge status: Home / Self Care | Payer: Medicare Other | Source: Ambulatory Visit | Attending: Family Medicine | Admitting: Family Medicine

## 2022-08-17 DIAGNOSIS — M545 Low back pain, unspecified: Secondary | ICD-10-CM

## 2022-08-17 DIAGNOSIS — M4316 Spondylolisthesis, lumbar region: Secondary | ICD-10-CM | POA: Diagnosis not present

## 2022-08-17 DIAGNOSIS — M48061 Spinal stenosis, lumbar region without neurogenic claudication: Secondary | ICD-10-CM | POA: Diagnosis not present

## 2022-08-17 DIAGNOSIS — M47816 Spondylosis without myelopathy or radiculopathy, lumbar region: Secondary | ICD-10-CM | POA: Diagnosis not present

## 2022-08-22 NOTE — Progress Notes (Unsigned)
    Brandi Frontera T. Trayden Brandy, MD, Brandi Mahoney at [facility] Brandi Mahoney [place], [address]  Phone: [phone]  FAX: [phone]  Brandi Mahoney - 67 y.o. female  MRN [id number]  Date of Birth: Mar 20, 1956  Date: 08/23/2022  PCP: Owens Loffler, MD  Referral: Owens Loffler, MD  No chief complaint on file.  Subjective:   Brandi Mahoney is a 67 y.o. very pleasant female patient with There is no height or weight on file to calculate BMI. who presents with the following:  She presents with 2 primary problems.  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  She has been having problems with her back for an extensive amount of time, including particularly over the last year. She has been to formal physical therapy in August 2023.  She did not think that this helped at all.  The last time I saw her, she was also having some issues with referred pain from the neck to the shoulder.    Review of Systems is noted in the HPI, as appropriate  Objective:   There were no vitals taken for this visit.  GEN: No acute distress; alert,appropriate. PULM: Breathing comfortably in no respiratory distress PSYCH: Normally interactive.   Laboratory and Imaging Data:  Assessment and Plan:   ***

## 2022-08-23 ENCOUNTER — Encounter: Payer: Self-pay | Admitting: Family Medicine

## 2022-08-23 ENCOUNTER — Telehealth: Payer: Self-pay | Admitting: Family Medicine

## 2022-08-23 ENCOUNTER — Ambulatory Visit (INDEPENDENT_AMBULATORY_CARE_PROVIDER_SITE_OTHER): Payer: Medicare Other | Admitting: Family Medicine

## 2022-08-23 VITALS — BP 130/80 | HR 78 | Temp 97.5°F | Ht 64.0 in | Wt 172.1 lb

## 2022-08-23 DIAGNOSIS — G4733 Obstructive sleep apnea (adult) (pediatric): Secondary | ICD-10-CM

## 2022-08-23 DIAGNOSIS — E119 Type 2 diabetes mellitus without complications: Secondary | ICD-10-CM

## 2022-08-23 DIAGNOSIS — M5030 Other cervical disc degeneration, unspecified cervical region: Secondary | ICD-10-CM

## 2022-08-23 DIAGNOSIS — M5137 Other intervertebral disc degeneration, lumbosacral region: Secondary | ICD-10-CM | POA: Diagnosis not present

## 2022-08-23 LAB — POCT GLYCOSYLATED HEMOGLOBIN (HGB A1C): Hemoglobin A1C: 8.3 % — AB (ref 4.0–5.6)

## 2022-08-23 MED ORDER — TRAZODONE HCL 100 MG PO TABS: 100.0000 mg | ORAL_TABLET | Freq: Every day | ORAL | 2 refills | Status: DC

## 2022-08-23 MED ORDER — GABAPENTIN 300 MG PO CAPS: 300.0000 mg | ORAL_CAPSULE | Freq: Three times a day (TID) | ORAL | 3 refills | Status: DC

## 2022-08-23 MED ORDER — ACCU-CHEK SOFTCLIX LANCETS MISC: 3 refills | Status: DC

## 2022-08-23 MED ORDER — ACCU-CHEK GUIDE W/DEVICE KIT: PACK | 0 refills | Status: DC

## 2022-08-23 MED ORDER — METFORMIN HCL ER 500 MG PO TB24: 1000.0000 mg | ORAL_TABLET | Freq: Every day | ORAL | 1 refills | Status: DC

## 2022-08-23 MED ORDER — ACCU-CHEK GUIDE VI STRP: ORAL_STRIP | 3 refills | Status: AC

## 2022-08-23 NOTE — Telephone Encounter (Signed)
Called patient to schedule Medicare Annual Wellness Visit (AWV). Left message for patient to call back and schedule Medicare Annual Wellness Visit (AWV).  Last date of AWV: 10/06/2022  Please schedule an appointment at any time with [facility].  If any questions, please contact me at [phone].  Thank you ,  [place] Direct Dial: [phone]

## 2022-09-01 ENCOUNTER — Encounter: Payer: Self-pay | Admitting: Physician Assistant

## 2022-09-01 ENCOUNTER — Ambulatory Visit: Payer: Medicare Other | Admitting: Physician Assistant

## 2022-09-01 VITALS — BP 134/82 | HR 88 | Ht 64.0 in | Wt 175.0 lb

## 2022-09-01 DIAGNOSIS — R112 Nausea with vomiting, unspecified: Secondary | ICD-10-CM

## 2022-09-01 DIAGNOSIS — Z86010 Personal history of colonic polyps: Secondary | ICD-10-CM

## 2022-09-01 DIAGNOSIS — R197 Diarrhea, unspecified: Secondary | ICD-10-CM

## 2022-09-01 NOTE — Progress Notes (Signed)
Chief Complaint: Nausea, vomiting abdominal pain  HPI:    Brandi Mahoney is a 67 year old female, known to Dr. Fuller Plan, with a past medical history as listed below including CAD, myocarditis and multiple others, who was referred to me by Owens Loffler, MD for a complaint of nausea, vomiting abdominal pain.    01/02/2018 colonoscopy with anal fissure, one 9 mm polyp in the ascending colon, two 6-7 mm polyps in the transverse colon and internal hemorrhoids.  Repeat recommended in 5 years.    Today, patient presents to clinic and tells me that a couple of weeks ago she started with some stomach pain and rolling with diarrhea multiple times a day with nausea and dry heaves, she went to see her PCP after this lasted for a couple of weeks and he told her it was likely the norovirus.  She has gradually gotten better and over the past 3 days has actually had solid stools and no further complaints.  Her husband now has developed some of the symptoms.  She feels good as far as her acute illness is concerned.  Aware that she is due for a colonoscopy and would like to schedule that while she is here.    Relays a new history of colon cancer in her son who was diagnosed a year ago at the age of 60.    Denies fever, chills, weight loss, heartburn or reflux.  Past Medical History:  Diagnosis Date   CAD (coronary artery disease)    a. 01/2010 Cath: mild atherosclerosis in the mid LCX after takeoff of OM1, mild diff LAD dzs;  b. 07/2013 MV: EF 67%, no ischemia.   GAD (generalized anxiety disorder)    History of kidney stones    HLD (hyperlipidemia)    Hypothyroidism    Major depressive disorder, recurrent episode, in full remission ([place]) 09/08/2008   Qualifier: Diagnosis of  By: Lorelei Pont MD, [place]     Migraine headache    Myocarditis ([place])    a. 01/2010 presented w/ c/p and elev trop-->Cath: mild atherosclerosis in the mid LCX after takeoff of OM1, mild diff LAD dzs;  b. 03/2010 Echo: EF 60-65%, DD, mild LVH;  c.  07/2011 Echo: EF 55-60%, mild LVH, no rwma.   Sleep apnea    has c-pap.  wears off and on - not lately  using  per reported 07-29-2020   Type 2 diabetes, diet controlled [facility])     Past Surgical History:  Procedure Laterality Date   APPENDECTOMY  1974   BREAST SURGERY Right 1988   fatty tumore from breast   CARDIAC CATHETERIZATION  8/11   no significant -[facility] Dr Rockey Situ,   CHOLECYSTECTOMY  2002   COLONOSCOPY  2017   CYST EXCISION     ovarian cyst 1994   NEPHROLITHOTOMY Right 08/20/2014   Procedure: RIGHT PERCUTANEOUS NEPHROLITHOTOMY ;  Surgeon: Malka So, MD;  Location: [facility];  Service: Urology;  Laterality: Right;   POLYPECTOMY     RECTAL EXAM UNDER ANESTHESIA N/A 08/02/2020   Procedure: Darci Needle EXAM UNDER ANESTHESIA INJECTION OF PERIANAL BOTOX;  Surgeon: Ileana Roup, MD;  Location: [facility];  Service: General;  Laterality: N/A;   TOTAL ABDOMINAL HYSTERECTOMY W/ BILATERAL SALPINGOOPHORECTOMY  2000   no ovaries, uterus, or cervix.    Current Outpatient Medications  Medication Sig Dispense Refill   Accu-Chek Softclix Lancets lancets Use to check blood sugar daily 100 each 3   aspirin EC 81 MG tablet Take 81 mg  by mouth daily.     Blood Glucose Monitoring Suppl (ACCU-CHEK GUIDE) w/Device KIT Use to check blood sugar daily 1 kit 0   candesartan (ATACAND) 4 MG tablet Take 4 mg by mouth daily.     celecoxib (CELEBREX) 200 MG capsule TAKE 1 CAPSULE BY MOUTH DAILY 90 capsule 3   clobetasol ointment (TEMOVATE) [id number] % Apply 1 Application topically 2 (two) times daily as needed.     cyclobenzaprine (FLEXERIL) 10 MG tablet TAKE 1 TABLET (10 MG TOTAL) BY MOUTH 2 (TWO) TIMES DAILY AS NEEDED (PAIN).     diazepam (VALIUM) 5 MG tablet Take 1 tablet 30 minutes before MRI 1 tablet 0   Erenumab-aooe 140 MG/ML SOAJ Inject into the skin.     fenofibrate 160 MG tablet TAKE ONE TABLET BY MOUTH DAILY 90 tablet 3   gabapentin (NEURONTIN) 300 MG capsule Take 1 capsule (300  mg total) by mouth 3 (three) times daily. 270 capsule 3   glucose blood (ACCU-CHEK GUIDE) test strip Use to check blood sugar daily 100 each 3   Lactobacillus-Inulin (CULTURELLE ADULT ULT BALANCE PO)      levothyroxine (SYNTHROID) 75 MCG tablet TAKE ONE TABLET BY MOUTH EVERY MORNING BEFORE BREAKFAST 90 tablet 3   Magnesium 250 MG TABS Take 1 tablet by mouth daily.     metFORMIN (GLUCOPHAGE-XR) 500 MG 24 hr tablet Take 2 tablets (1,000 mg total) by mouth daily with breakfast. 180 tablet 1   metoprolol succinate (TOPROL-XL) 50 MG 24 hr tablet TAKE ONE TABLET BY MOUTH DAILY WITH OR IMMEDIATELY FOLLOWING A MEAL 30 tablet 0   Multiple Vitamins-Minerals (ONE-A-DAY WOMENS PETITES PO) Take 2 tablets by mouth daily.     nitroGLYCERIN (NITROSTAT) 0.4 MG SL tablet 1 tablet under tongue at onset of chest pain; you may repeat every 5 minutes for up to 3 doses. 25 tablet 3   pantoprazole (PROTONIX) 40 MG tablet TAKE ONE TABLET BY MOUTH DAILY 90 tablet 3   pravastatin (PRAVACHOL) 40 MG tablet TAKE ONE TABLET BY MOUTH AT BEDTIME 90 tablet 3   sertraline (ZOLOFT) 50 MG tablet Take 1 tablet (50 mg total) by mouth daily. 90 tablet 1   traZODone (DESYREL) 100 MG tablet Take 1 tablet (100 mg total) by mouth at bedtime. 30 tablet 2   No current facility-administered medications for this visit.    Allergies as of 09/01/2022 - Review Complete 08/23/2022  Allergen Reaction Noted   Pseudoeph-doxylamine-dm-apap Anaphylaxis 09/08/2008   Gluten Other (See Comments)    Penicillins Itching and Rash 09/08/2008    Family History  Problem Relation Age of Onset   Coronary artery disease Mother    Diabetes Mother    ALS Mother    Hyperlipidemia Father    Breast cancer Paternal Grandmother    Lung cancer Paternal Grandfather        CAD   Other Daughter        gluten   Down syndrome Son    Colon cancer Maternal Grandfather        dx early 25's    Breast cancer Sister 79   Esophageal cancer Neg Hx    Liver cancer  Neg Hx    Pancreatic cancer Neg Hx    Rectal cancer Neg Hx    Stomach cancer Neg Hx     Social History   Socioeconomic History   Marital status: Married    Spouse name: Nicole Kindred   Number of children: 3   Years of education: Not on  file   Highest education level: Not on file  Occupational History   Occupation: retired    Comment: [employer]  Tobacco Use   Smoking status: Former    Packs/day: 0.35    Years: 35.00    Additional pack years: 0.00    Total pack years: 12.25    Types: Cigarettes    Quit date: 09/25/2008    Years since quitting: 13.9   Smokeless tobacco: Never   Tobacco comments:    quit in 2007-2008, social smoker  Vaping Use   Vaping Use: Never used  Substance and Sexual Activity   Alcohol use: Not Currently   Drug use: No   Sexual activity: Not Currently  Other Topics Concern   Not on file  Social History Narrative   Lives in [place] w/ husband.  No reg exercise.   Social Determinants of Health   Financial Resource Strain: Not on file  Food Insecurity: Not on file  Transportation Needs: Not on file  Physical Activity: Not on file  Stress: Not on file  Social Connections: Not on file  Intimate Partner Violence: Not on file    Review of Systems:    Constitutional: No weight loss, fever or chills Skin: No rash Cardiovascular: No chest pain Respiratory: No SOB  Gastrointestinal: See HPI and otherwise negative Genitourinary: No dysuria or change in urinary frequency Neurological: No headache, dizziness or syncope Musculoskeletal: No new muscle or joint pain Hematologic: No bleeding  Psychiatric: No history of depression or anxiety   Physical Exam:  Vital signs: BP 134/82   Pulse 88   Ht 5\' 4"  (1.626 m)   Wt 175 lb (79.4 kg)   BMI 30.04 kg/m    Constitutional:   Pleasant Caucasian female appears to be in NAD, Well developed, Well nourished, alert and cooperative Head:  Normocephalic and atraumatic. Eyes:   PEERL, EOMI. No icterus. Conjunctiva  pink. Ears:  Normal auditory acuity. Neck:  Supple Throat: Oral cavity and pharynx without inflammation, swelling or lesion.  Respiratory: Respirations even and unlabored. Lungs clear to auscultation bilaterally.   No wheezes, crackles, or rhonchi.  Cardiovascular: Normal S1, S2. No MRG. Regular rate and rhythm. No peripheral edema, cyanosis or pallor.  Gastrointestinal:  Soft, nondistended, mild generalized ttp, No rebound or guarding. Normal bowel sounds. No appreciable masses or hepatomegaly. Rectal:  Not performed.  Msk:  Symmetrical without gross deformities. Without edema, no deformity or joint abnormality.  Neurologic:  Alert and  oriented x4;  grossly normal neurologically.  Skin:   Dry and intact without significant lesions or rashes. Psychiatric: Demonstrates good judgement and reason without abnormal affect or behaviors.  RELEVANT LABS AND IMAGING: CBC    Component Value Date/Time   WBC 6.3 09/28/2021 1034   RBC 4.95 09/28/2021 1034   HGB 13.3 09/28/2021 1034   HGB 13.3 08/05/2013 1739   HCT 40.9 09/28/2021 1034   HCT 39.3 08/05/2013 1739   PLT 254.0 09/28/2021 1034   PLT 267 08/05/2013 1739   MCV 82.7 09/28/2021 1034   MCV 82 08/05/2013 1739   MCH 26.4 11/01/2018 2358   MCHC 32.5 09/28/2021 1034   RDW 14.5 09/28/2021 1034   RDW 13.7 08/05/2013 1739   LYMPHSABS 2.2 09/28/2021 1034   MONOABS 0.5 09/28/2021 1034   EOSABS 0.2 09/28/2021 1034   BASOSABS 0.0 09/28/2021 1034    CMP     Component Value Date/Time   NA 140 09/28/2021 1034   NA 139 08/05/2013 1739   K 4.5  09/28/2021 1034   K 3.7 08/05/2013 1739   CL 103 09/28/2021 1034   CL 108 (H) 08/05/2013 1739   CO2 30 09/28/2021 1034   CO2 24 08/05/2013 1739   GLUCOSE 134 (H) 09/28/2021 1034   GLUCOSE 108 (H) 08/05/2013 1739   BUN 10 09/28/2021 1034   BUN 15 08/05/2013 1739   CREATININE 0.74 09/28/2021 1034   CREATININE 0.63 08/05/2013 1739   CALCIUM 9.7 09/28/2021 1034   CALCIUM 8.6 08/05/2013 1739    PROT 6.9 12/27/2021 0916   PROT 7.0 04/04/2012 2204   ALBUMIN 4.7 12/27/2021 0916   ALBUMIN 4.2 04/04/2012 2204   AST 36 12/27/2021 0916   AST 29 04/04/2012 2204   ALT 37 (H) 12/27/2021 0916   ALT 40 04/04/2012 2204   ALKPHOS 56 12/27/2021 0916   ALKPHOS 76 04/04/2012 2204   BILITOT 0.6 12/27/2021 0916   BILITOT 0.3 04/04/2012 2204   GFRNONAA >60 11/01/2018 2358   GFRNONAA >60 08/05/2013 1739   GFRAA >60 11/01/2018 2358   GFRAA >60 08/05/2013 1739    Assessment: 1.  Nausea/vomiting/diarrhea: Most likely a viral illness as this has gotten better over the past 3 days. 2.  History of adenomatous polyps: Patient is due for colonoscopy in July for surveillance 3.  Family history of colon cancer: In her son diagnosed at the age of 71  Plan: 1.  Scheduled patient for surveillance colonoscopy in the [place] with Dr. Fuller Plan.  Did provide the patient with a detailed list risks for the procedure and she agrees to proceed.  This is scheduled in July when it is due. Patient is appropriate for endoscopic procedure(s) in the ambulatory ([place]) setting.  2.  Discussed as needed Imodium if she continues with a little bit of diarrhea. 3.  Patient to follow in clinic per recommendations after time of procedure.  Ellouise Newer, PA-C [facility] 09/01/2022, 10:59 AM  Cc: Owens Loffler, MD

## 2022-09-01 NOTE — Patient Instructions (Addendum)
You have been scheduled for a colonoscopy on 12/28/22 at 1:30 pm with 12:30 pm arrival.  You are scheduled for a TELEPHONE previsit on 12/11/22 at 10:30 am. Please make sure you are available at this time via phone number [phone].    If your blood pressure at your visit was 140/90 or greater, please contact your primary care physician to follow up on this.  _______________________________________________________  If you are age 67 or older, your body mass index should be between 23-30. Your Body mass index is 30.04 kg/m. If this is out of the aforementioned range listed, please consider follow up with your Primary Care Provider.  If you are age 66 or younger, your body mass index should be between 19-25. Your Body mass index is 30.04 kg/m. If this is out of the aformentioned range listed, please consider follow up with your Primary Care Provider.   ________________________________________________________  The [facility] providers would like to encourage you to use [facility] to communicate with providers for non-urgent requests or questions.  Due to long hold times on the telephone, sending your provider a message by [facility] may be a faster and more efficient way to get a response.  Please allow 48 business hours for a response.  Please remember that this is for non-urgent requests.  _______________________________________________________  Due to recent changes in healthcare laws, you may see the results of your imaging and laboratory studies on MyChart before your provider has had a chance to review them.  We understand that in some cases there may be results that are confusing or concerning to you. Not all laboratory results come back in the same time frame and the provider may be waiting for multiple results in order to interpret others.  Please give [place] 48 hours in order for your provider to thoroughly review all the results before contacting the office for clarification of your results.

## 2022-10-10 ENCOUNTER — Other Ambulatory Visit: Payer: Self-pay | Admitting: Family Medicine

## 2022-10-10 NOTE — Telephone Encounter (Signed)
Please call and schedule Medicare Wellness with nurse and  CPE with fasting labs prior with Dr. Copland.  

## 2022-10-10 NOTE — Telephone Encounter (Signed)
Lvmtcb and sched  

## 2022-10-17 ENCOUNTER — Other Ambulatory Visit: Payer: Self-pay | Admitting: Family Medicine

## 2022-10-17 NOTE — Telephone Encounter (Signed)
Please call and schedule Medicare Wellness with nurse and  CPE with fasting labs prior with Dr. Copland.  

## 2022-10-17 NOTE — Telephone Encounter (Signed)
LVM for pt to cb and sch.  

## 2022-10-30 ENCOUNTER — Encounter: Payer: Self-pay | Admitting: Family Medicine

## 2022-10-30 ENCOUNTER — Ambulatory Visit (INDEPENDENT_AMBULATORY_CARE_PROVIDER_SITE_OTHER): Payer: Medicare Other | Admitting: Family Medicine

## 2022-10-30 VITALS — BP 120/72 | HR 86 | Temp 97.5°F | Ht 64.0 in | Wt 169.1 lb

## 2022-10-30 DIAGNOSIS — R220 Localized swelling, mass and lump, head: Secondary | ICD-10-CM

## 2022-10-30 MED ORDER — PREDNISONE 20 MG PO TABS: ORAL_TABLET | ORAL | 0 refills | Status: DC

## 2022-10-30 NOTE — Progress Notes (Signed)
Rayan Ines T. Kaniah Rizzolo, MD, CAQ Sports Medicine [facility] at [facility] [address] [place], [address]  Phone: [phone]  FAX: [phone]  Brandi Mahoney - 66 y.o. female  MRN [id number]  Date of Birth: 09/25/1955  Date: 10/30/2022  PCP: Hannah Beat, MD  Referral: Hannah Beat, MD  Chief Complaint  Patient presents with   Oral Swelling    Upper Lip x 2 months   Subjective:   Brandi Mahoney is a 67 y.o. very pleasant female patient with Body mass index is 29.03 kg/m. who presents with the following:  Upper lip - swollen some off and on over the last 2 months.    This has been variable over time.  It would wax and wane.  She has never had any lower lip swelling, eye, eyelid, cheek, tongue, throat swelling at all.  Mouthwash, oral jel, lemon juice.    Went to the dentist in a month or so ago.   Benadryl helped a little bit.    Review of Systems is noted in the HPI, as appropriate  Objective:   BP 120/72 (BP Location: Left Arm, Patient Position: Sitting, Cuff Size: Large)   Pulse 86   Temp (!) 97.5 F (36.4 C) (Temporal)   Ht 5\' 4"  (1.626 m)   Wt 169 lb 2 oz (76.7 kg)   SpO2 97%   BMI 29.03 kg/m   GEN: No acute distress; alert,appropriate. PULM: Breathing comfortably in no respiratory distress PSYCH: Normally interactive.     Laboratory and Imaging Data:  Assessment and Plan:     ICD-10-CM   1. Swollen lip  R22.0      Mildly swollen upper lip.  I would hopefully this will resolve with some antihistamines and prednisone.  If it persist for an extended period of time, we could also try stopping her ARB, but since she has cardiac disease I would really like not to do that.  Patient Instructions  Take one tablet of Zyrtec each morning for 2 weeks  Take Benadryl 50 mg each night before bed   Medication Management during today's office visit: Meds ordered this encounter  Medications   predniSONE (DELTASONE)  20 MG tablet    Sig: 2 tabs po daily for 5 days, then 1 tab po daily for 5 days    Dispense:  15 tablet    Refill:  0   There are no discontinued medications.  Orders placed today for conditions managed today: No orders of the defined types were placed in this encounter.   Disposition: No follow-ups on file.  [employer] One speech-to-text software was used for transcription in this dictation.  Possible transcriptional errors can occur using Animal nutritionist.   Signed,  Elpidio Galea. Jennah Satchell, MD   Outpatient Encounter Medications as of 10/30/2022  Medication Sig   Accu-Chek Softclix Lancets lancets Use to check blood sugar daily   aspirin EC 81 MG tablet Take 81 mg by mouth daily.   Blood Glucose Monitoring Suppl (ACCU-CHEK GUIDE) w/Device KIT Use to check blood sugar daily   candesartan (ATACAND) 4 MG tablet Take 4 mg by mouth daily.   celecoxib (CELEBREX) 200 MG capsule TAKE 1 CAPSULE BY MOUTH DAILY   clobetasol ointment (TEMOVATE) 0.05 % Apply 1 Application topically 2 (two) times daily as needed.   cyclobenzaprine (FLEXERIL) 10 MG tablet TAKE 1 TABLET (10 MG TOTAL) BY MOUTH 2 (TWO) TIMES DAILY AS NEEDED (PAIN).   diazepam (VALIUM) 5 MG tablet  Take 1 tablet 30 minutes before MRI   Erenumab-aooe 140 MG/ML SOAJ Inject into the skin.   fenofibrate 160 MG tablet TAKE ONE TABLET BY MOUTH DAILY   gabapentin (NEURONTIN) 300 MG capsule Take 1 capsule (300 mg total) by mouth 3 (three) times daily.   glucose blood (ACCU-CHEK GUIDE) test strip Use to check blood sugar daily   Lactobacillus-Inulin (CULTURELLE ADULT ULT BALANCE PO)    levothyroxine (SYNTHROID) 75 MCG tablet TAKE ONE TABLET BY MOUTH EVERY MORNING BEFORE BREAKFAST   Magnesium 250 MG TABS Take 1 tablet by mouth daily.   metFORMIN (GLUCOPHAGE-XR) 500 MG 24 hr tablet Take 2 tablets (1,000 mg total) by mouth daily with breakfast.   metoprolol succinate (TOPROL-XL) 50 MG 24 hr tablet TAKE ONE TABLET BY MOUTH DAILY WITH OR  IMMEDIATELY FOLLOWING A MEAL   Multiple Vitamins-Minerals (ONE-A-DAY WOMENS PETITES PO) Take 2 tablets by mouth daily.   nitroGLYCERIN (NITROSTAT) 0.4 MG SL tablet 1 tablet under tongue at onset of chest pain; you may repeat every 5 minutes for up to 3 doses.   pantoprazole (PROTONIX) 40 MG tablet TAKE ONE TABLET BY MOUTH DAILY   pravastatin (PRAVACHOL) 40 MG tablet TAKE ONE TABLET BY MOUTH AT BEDTIME   predniSONE (DELTASONE) 20 MG tablet 2 tabs po daily for 5 days, then 1 tab po daily for 5 days   sertraline (ZOLOFT) 50 MG tablet Take 1 tablet (50 mg total) by mouth daily.   traZODone (DESYREL) 100 MG tablet Take 1 tablet (100 mg total) by mouth at bedtime.   No facility-administered encounter medications on file as of 10/30/2022.

## 2022-10-30 NOTE — Patient Instructions (Signed)
Take one tablet of Zyrtec each morning for 2 weeks  Take Benadryl 50 mg each night before bed

## 2022-11-21 ENCOUNTER — Other Ambulatory Visit: Payer: Self-pay | Admitting: Family Medicine

## 2022-11-21 DIAGNOSIS — E559 Vitamin D deficiency, unspecified: Secondary | ICD-10-CM

## 2022-11-21 DIAGNOSIS — E782 Mixed hyperlipidemia: Secondary | ICD-10-CM

## 2022-11-21 DIAGNOSIS — Z79899 Other long term (current) drug therapy: Secondary | ICD-10-CM

## 2022-11-21 DIAGNOSIS — E119 Type 2 diabetes mellitus without complications: Secondary | ICD-10-CM

## 2022-11-21 DIAGNOSIS — E039 Hypothyroidism, unspecified: Secondary | ICD-10-CM

## 2022-11-22 ENCOUNTER — Other Ambulatory Visit (INDEPENDENT_AMBULATORY_CARE_PROVIDER_SITE_OTHER): Payer: Medicare Other

## 2022-11-22 ENCOUNTER — Encounter: Payer: Medicare Other | Admitting: Family Medicine

## 2022-11-22 DIAGNOSIS — Z79899 Other long term (current) drug therapy: Secondary | ICD-10-CM

## 2022-11-22 DIAGNOSIS — E559 Vitamin D deficiency, unspecified: Secondary | ICD-10-CM | POA: Diagnosis not present

## 2022-11-22 DIAGNOSIS — E039 Hypothyroidism, unspecified: Secondary | ICD-10-CM | POA: Diagnosis not present

## 2022-11-22 DIAGNOSIS — E782 Mixed hyperlipidemia: Secondary | ICD-10-CM | POA: Diagnosis not present

## 2022-11-22 DIAGNOSIS — E119 Type 2 diabetes mellitus without complications: Secondary | ICD-10-CM

## 2022-11-23 LAB — CBC WITH DIFFERENTIAL/PLATELET
Basophils Absolute: 0 10*3/uL (ref 0.0–0.1)
Basophils Relative: 0.6 % (ref 0.0–3.0)
Eosinophils Absolute: 0.1 10*3/uL (ref 0.0–0.7)
Eosinophils Relative: 1.5 % (ref 0.0–5.0)
HCT: 37.9 % (ref 36.0–46.0)
Hemoglobin: 12.2 g/dL (ref 12.0–15.0)
Lymphocytes Relative: 29.8 % (ref 12.0–46.0)
Lymphs Abs: 2.1 10*3/uL (ref 0.7–4.0)
MCHC: 32.2 g/dL (ref 30.0–36.0)
MCV: 82 fl (ref 78.0–100.0)
Monocytes Absolute: 0.6 10*3/uL (ref 0.1–1.0)
Monocytes Relative: 7.9 % (ref 3.0–12.0)
Neutro Abs: 4.3 10*3/uL (ref 1.4–7.7)
Neutrophils Relative %: 60.2 % (ref 43.0–77.0)
Platelets: 233 10*3/uL (ref 150.0–400.0)
RBC: 4.62 Mil/uL (ref 3.87–5.11)
RDW: 14.1 % (ref 11.5–15.5)
WBC: 7.1 10*3/uL (ref 4.0–10.5)

## 2022-11-23 LAB — MICROALBUMIN / CREATININE URINE RATIO
Creatinine,U: 71 mg/dL
Microalb Creat Ratio: 1 mg/g (ref 0.0–30.0)
Microalb, Ur: <0.7 mg/dL (ref 0.0–1.9)

## 2022-11-23 LAB — LIPID PANEL
Cholesterol: 124 mg/dL (ref 0–200)
HDL: 40.1 mg/dL (ref >39.00)
NonHDL: 84.13
Total CHOL/HDL Ratio: 3
Triglycerides: 250 mg/dL — ABNORMAL HIGH (ref 0.0–149.0)
VLDL: 50 mg/dL — ABNORMAL HIGH (ref 0.0–40.0)

## 2022-11-23 LAB — TSH: TSH: 1.86 u[IU]/mL (ref 0.35–5.50)

## 2022-11-23 LAB — VITAMIN D 25 HYDROXY (VIT D DEFICIENCY, FRACTURES): VITD: 39.94 ng/mL (ref 30.00–100.00)

## 2022-11-23 LAB — BASIC METABOLIC PANEL
BUN: 15 mg/dL (ref 6–23)
CO2: 27 mEq/L (ref 19–32)
Calcium: 9.6 mg/dL (ref 8.4–10.5)
Chloride: 100 mEq/L (ref 96–112)
Creatinine, Ser: 0.7 mg/dL (ref 0.40–1.20)
GFR: 90.07 mL/min (ref >60.00)
Glucose, Bld: 116 mg/dL — ABNORMAL HIGH (ref 70–99)
Potassium: 4.1 mEq/L (ref 3.5–5.1)
Sodium: 138 mEq/L (ref 135–145)

## 2022-11-23 LAB — LDL CHOLESTEROL, DIRECT: Direct LDL: 62 mg/dL

## 2022-11-23 LAB — T4, FREE: Free T4: 1 ng/dL (ref 0.60–1.60)

## 2022-11-23 LAB — HEPATIC FUNCTION PANEL
ALT: 44 U/L — ABNORMAL HIGH (ref 0–35)
AST: 42 U/L — ABNORMAL HIGH (ref 0–37)
Albumin: 4.6 g/dL (ref 3.5–5.2)
Alkaline Phosphatase: 61 U/L (ref 39–117)
Bilirubin, Direct: 0.2 mg/dL (ref 0.0–0.3)
Total Bilirubin: 0.7 mg/dL (ref 0.2–1.2)
Total Protein: 6.9 g/dL (ref 6.0–8.3)

## 2022-11-23 LAB — HEMOGLOBIN A1C: Hgb A1c MFr Bld: 7.8 % — ABNORMAL HIGH (ref 4.6–6.5)

## 2022-11-23 LAB — T3, FREE: T3, Free: 3.1 pg/mL (ref 2.3–4.2)

## 2022-11-25 ENCOUNTER — Other Ambulatory Visit: Payer: Self-pay | Admitting: Family Medicine

## 2022-11-26 ENCOUNTER — Other Ambulatory Visit: Payer: Self-pay | Admitting: Family Medicine

## 2022-11-29 NOTE — Progress Notes (Signed)
Saiquan Hands T. Kristianna Saperstein, MD, CAQ Sports Medicine [facility] [address] [place], [address]  Phone: [phone]  FAX: [phone]  Brandi Mahoney - 67 y.o. female  MRN [id number]  Date of Birth: 1955/07/21  Date: 11/30/2022  PCP: Hannah Beat, MD  Referral: Hannah Beat, MD  Chief Complaint  Patient presents with   Medicare Wellness   Patient Care Team: Hannah Beat, MD as PCP - General Byrnett, Merrily Pew, MD (General Surgery) Arvil Chaco, Belia Heman, MD (Unknown Physician Specialty) Subjective:   Brandi Mahoney is a 67 y.o. pleasant patient who presents for a medicare wellness examination:  Health Maintenance Summary Reviewed and updated, unless pt declines services.  Tobacco History Reviewed. Non-smoker Alcohol: No concerns, no excessive use Exercise Habits: Some activity, rec at least 30 mins 5 times a week STD concerns: none Drug Use: None Birth control method: n/a Menses regular: n/a Lumps or breast concerns: no Breast Cancer Family History: no  Eye exam - 09/2022 Tdap - will get at pharmacy Foot Dexa scan  Toenails - off a little bit  Cough - d/c ARB She has been having a persistent dry cough for many months.  Diabetes Mellitus: Tolerating Medications: yes -she is currently on metformin 1000 mg p.o. twice daily Compliance with diet: fair, Body mass index is 29.88 kg/m. Exercise: minimal / intermittent Avg blood sugars at home: not checking Foot problems: none Hypoglycemia: none No nausea, vomitting, blurred vision, polyuria.  Lab Results  Component Value Date   HGBA1C 7.8 (H) 11/22/2022   HGBA1C 8.3 (A) 08/23/2022   HGBA1C 7.2 (H) 12/27/2021   Lab Results  Component Value Date   MICROALBUR <0.7 11/22/2022   LDLCALC 56 09/28/2021   CREATININE 0.70 11/22/2022     Lipids: Doing well, stable. Tolerating meds fine with no SE. Panel reviewed with patient.  Lipids: Lab Results  Component  Value Date   CHOL 124 11/22/2022   Lab Results  Component Value Date   HDL 40.10 11/22/2022   Lab Results  Component Value Date   LDLCALC 56 09/28/2021   Lab Results  Component Value Date   TRIG 250.0 (H) 11/22/2022   Lab Results  Component Value Date   CHOLHDL 3 11/22/2022    Lab Results  Component Value Date   ALT 44 (H) 11/22/2022   AST 42 (H) 11/22/2022   ALKPHOS 61 11/22/2022   BILITOT 0.7 11/22/2022    Thyroid: No symptoms. Labs reviewed. Denies cold / heat intolerance, dry skin, hair loss. No goiter.  Lab Results  Component Value Date   TSH 1.86 11/22/2022    Acute on chronic low back pain with exacerbation and longstanding radicular symptoms.  This has been plagued for the patient for multiple months, and she has failed all manner of conservative care including multiple rounds of steroids, oral NSAIDs, muscle relaxants, and formal physical therapy.  CLINICAL DATA:  Low back pain.  Ongoing for over 6 weeks.   EXAM: MRI LUMBAR SPINE WITHOUT CONTRAST   TECHNIQUE: Multiplanar, multisequence MR imaging of the lumbar spine was performed. No intravenous contrast was administered.   COMPARISON:  None Available.   FINDINGS: Segmentation:  Standard.   Alignment: Grade 1 anterolisthesis of L4 on L5 secondary to facet disease. Minimal retrolisthesis of L3 on L4.   Vertebrae: No acute fracture, evidence of discitis, or aggressive bone lesion.   Conus medullaris and cauda equina: Conus extends to the L1 level. Conus and cauda equina  appear normal. Small sacral Tarlov cyst.   Paraspinal and other soft tissues: No acute paraspinal abnormality.   Disc levels:   Disc spaces: Degenerative disease with mild disc height loss L4-5.   T12-L1: No significant disc bulge. No neural foraminal stenosis. No central canal stenosis.   L1-L2: No significant disc bulge. Mild bilateral facet arthropathy. No foraminal or central canal stenosis.   L2-L3: Mild broad-based  disc bulge. Mild bilateral facet arthropathy. No foraminal or central canal stenosis.   L3-L4: Broad-based disc bulge. Mild bilateral facet arthropathy. Mild spinal stenosis. No foraminal stenosis.   L4-L5: Broad-based disc bulge with a small central disc protrusion. Moderate bilateral facet arthropathy with small bilateral facet effusions and ligamentum flavum infolding. Moderate spinal stenosis. Bilateral lateral recess stenosis. Mild-moderate left foraminal stenosis. No right foraminal stenosis.   L5-S1: No significant disc bulge. No neural foraminal stenosis. No central canal stenosis. Mild bilateral facet arthropathy.   IMPRESSION: 1. Lumbar spine spondylosis as described above. 2. No acute osseous injury of the lumbar spine.     Electronically Signed   By: Elige Ko M.D.   On: 08/18/2022 13:50    Health Maintenance  Topic Date Due   OPHTHALMOLOGY EXAM  Never done   DTaP/Tdap/Td (2 - Tdap) 05/25/2019   DEXA SCAN  Never done   COVID-19 Vaccine (7 - 2023-24 season) 02/17/2022   Medicare Annual Wellness (AWV)  10/06/2022   Colonoscopy  01/03/2023   INFLUENZA VACCINE  01/18/2023   HEMOGLOBIN A1C  05/24/2023   Diabetic kidney evaluation - eGFR measurement  11/22/2023   Diabetic kidney evaluation - Urine ACR  11/22/2023   FOOT EXAM  11/30/2023   MAMMOGRAM  02/18/2024   Pneumonia Vaccine 72+ Years old  Completed   Hepatitis C Screening  Completed   Zoster Vaccines- Shingrix  Completed   HPV VACCINES  Aged Out   Immunization History  Administered Date(s) Administered   Influenza Split 03/19/2013   Influenza, High Dose Seasonal PF 02/16/2022   Influenza,inj,Quad PF,6+ Mos 04/20/2017, 04/04/2018, 04/07/2019   Influenza-Unspecified 03/20/2015   Moderna Sars-Covid-2 Vaccination 08/13/2019, 09/12/2019   PFIZER(Purple Top)SARS-COV-2 Vaccination 04/13/2020, 11/20/2020   PNEUMOCOCCAL CONJUGATE-20 10/05/2021   Pfizer Covid-19 Vaccine Bivalent Booster 83yrs & up 04/19/2021,  12/06/2021   Pneumococcal Polysaccharide-23 03/02/2016   Rsv, Bivalent, Protein Subunit Rsvpref,pf Verdis Frederickson) 02/16/2022   Td 05/24/2009   Zoster Recombinat (Shingrix) 08/16/2018, 01/10/2019    Patient Active Problem List   Diagnosis Date Noted   Coronary artery disease involving native coronary artery of native heart with angina pectoris (HCC)     Priority: High   Type 2 diabetes, diet controlled (HCC)     Priority: High   Mixed hyperlipidemia 04/01/2010    Priority: High   HYPERTRIGLYCERIDEMIA 07/21/2009    Priority: High   Hypothyroidism in adult 09/08/2008    Priority: High   Obstructive sleep apnea 09/25/2013    Priority: Medium    Tobacco abuse, in remission 09/19/2013    Priority: Medium    Major depressive disorder, recurrent episode, in full remission (HCC) 09/08/2008    Priority: Medium    Lichen sclerosus 03/13/2017   GAD (generalized anxiety disorder)    IBS (irritable bowel syndrome) 09/26/2012   Viral myocarditis, 01/2010 01/27/2010   ASTHMA 09/08/2008    Past Medical History:  Diagnosis Date   CAD (coronary artery disease)    a. 01/2010 Cath: mild atherosclerosis in the mid LCX after takeoff of OM1, mild diff LAD dzs;  b. 07/2013 MV: EF  67%, no ischemia.   GAD (generalized anxiety disorder)    History of kidney stones    HLD (hyperlipidemia)    Hypothyroidism    Major depressive disorder, recurrent episode, in full remission (HCC) 09/08/2008   Qualifier: Diagnosis of  By: Patsy Lager MD, Quamel Fitzmaurice     Migraine headache    Myocarditis (HCC)    a. 01/2010 presented w/ c/p and elev trop-->Cath: mild atherosclerosis in the mid LCX after takeoff of OM1, mild diff LAD dzs;  b. 03/2010 Echo: EF 60-65%, DD, mild LVH;  c. 07/2011 Echo: EF 55-60%, mild LVH, no rwma.   Sleep apnea    has c-pap.  wears off and on - not lately  using  per reported 07-29-2020   Type 2 diabetes, diet controlled [facility])     Past Surgical History:  Procedure Laterality Date   APPENDECTOMY  1974    BREAST SURGERY Right 1988   fatty tumore from breast   CARDIAC CATHETERIZATION  8/11   [facility],   CHOLECYSTECTOMY  2002   COLONOSCOPY  2017   CYST EXCISION     ovarian cyst 1994   NEPHROLITHOTOMY Right 08/20/2014   Procedure: RIGHT PERCUTANEOUS NEPHROLITHOTOMY ;  Surgeon: Anner Crete, MD;  Location: [facility];  Service: Urology;  Laterality: Right;   POLYPECTOMY     RECTAL EXAM UNDER ANESTHESIA N/A 08/02/2020   Procedure: Lurena Joiner EXAM UNDER ANESTHESIA INJECTION OF PERIANAL BOTOX;  Surgeon: Andria Meuse, MD;  Location: [facility];  Service: General;  Laterality: N/A;   TOTAL ABDOMINAL HYSTERECTOMY W/ BILATERAL SALPINGOOPHORECTOMY  2000   no ovaries, uterus, or cervix.    Family History  Problem Relation Age of Onset   Coronary artery disease Mother    Diabetes Mother    ALS Mother    Hyperlipidemia Father    Breast cancer Paternal Grandmother    Lung cancer Paternal Grandfather        CAD   Other Daughter        gluten   Down syndrome Son    Colon cancer Maternal Grandfather        dx early 55's    Breast cancer Sister 50   Esophageal cancer Neg Hx    Liver cancer Neg Hx    Pancreatic cancer Neg Hx    Rectal cancer Neg Hx    Stomach cancer Neg Hx     Social History   Social History Narrative   Lives in [place] w/ husband.  No reg exercise.    Past Medical History, Surgical History, Social History, Family History, Problem List, Medications, and Allergies have been reviewed and updated if relevant.  Review of Systems: Pertinent positives are listed above.  Otherwise, a full 14 point review of systems has been done in full and it is negative except where it is noted positive.  Objective:   BP 130/70 (BP Location: Left Arm, Patient Position: Sitting, Cuff Size: Large)   Pulse 84   Temp (!) 97.5 F (36.4 C) (Temporal)   Ht 5' 3.5" (1.613 m)   Wt 171 lb 6 oz (77.7 kg)   SpO2 97%   BMI 29.88 kg/m     03/01/2021     1:36 PM 07/30/2021    5:12 PM 10/05/2021   10:55 AM 12/06/2021    8:50 AM 07/31/2022   10:28 AM  Fall Risk  Falls in the past year?   0 0 0  Was there an injury with Fall?  0 0  Fall Risk Category Calculator    0 0  Fall Risk Category (Retired)    Low   (RETIRED) Patient Fall Risk Level Low fall risk Low fall risk  Low fall risk   Patient at Risk for Falls Due to    No Fall Risks No Fall Risks  Fall risk Follow up    Falls evaluation completed Falls evaluation completed   Ideal Body Weight: Weight in (lb) to have BMI = 25: 143.1 Hearing Screening  Method: Audiometry   500Hz  1000Hz  2000Hz  4000Hz   Right ear 20 20 20 20   Left ear 20 20 20 20   Vision Screening - Comments:: Wear Glasses-[facility] Ex[facility] April 2024    12/06/2021    9:21 AM 10/05/2021   10:55 AM 01/28/2020   11:33 AM 05/23/2017    8:55 AM  Depression screen PHQ 2/9  Decreased Interest 0 0 0 0  Down, Depressed, Hopeless 2 0 0 0  PHQ - 2 Score 2 0 0 0  Altered sleeping 1     Tired, decreased energy 0     Change in appetite 0     Feeling bad or failure about yourself  0     Trouble concentrating 0     Moving slowly or fidgety/restless 0     Suicidal thoughts 0     PHQ-9 Score 3     Difficult doing work/chores Somewhat difficult        GEN: well developed, well nourished, no acute distress Eyes: conjunctiva and lids normal, PERRLA, EOMI ENT: TM clear, nares clear, oral exam WNL Neck: supple, no lymphadenopathy, no thyromegaly, no JVD Pulm: clear to auscultation and percussion, respiratory effort normal CV: regular rate and rhythm, S1-S2, no murmur, rub or gallop, no bruits Chest: no scars, masses, no lumps BREAST: breast exam declined GI: soft, non-tender; no hepatosplenomegaly, masses; active bowel sounds all quadrants GU: GU exam declined Lymph: no cervical, axillary or inguinal adenopathy MSK: gait normal, muscle tone and strength WNL, no joint swelling, effusions, discoloration, crepitus  SKIN:  clear, good turgor, color WNL, no rashes, lesions, or ulcerations Neuro: normal mental status, normal strength, sensation, and motion Psych: alert; oriented to person, place and time, normally interactive and not anxious or depressed in appearance.   All labs reviewed with patient.  Lipids: Lab Results  Component Value Date   CHOL 124 11/22/2022   Lab Results  Component Value Date   HDL 40.10 11/22/2022   Lab Results  Component Value Date   LDLCALC 56 09/28/2021   Lab Results  Component Value Date   TRIG 250.0 (H) 11/22/2022   Lab Results  Component Value Date   CHOLHDL 3 11/22/2022   CBC:    Latest Ref Rng & Units 11/22/2022    3:58 PM 09/28/2021   10:34 AM 09/21/2020   11:38 AM  CBC  WBC 4.0 - 10.5 K/uL 7.1  6.3  6.2   Hemoglobin 12.0 - 15.0 g/dL 16.1  09.6  04.5   Hematocrit 36.0 - 46.0 % 37.9  40.9  39.9   Platelets 150.0 - 400.0 K/uL 233.0  254.0  211.0     Basic Metabolic Panel:    Component Value Date/Time   NA 138 11/22/2022 1558   NA 139 08/05/2013 1739   K 4.1 11/22/2022 1558   K 3.7 08/05/2013 1739   CL 100 11/22/2022 1558   CL 108 (H) 08/05/2013 1739   CO2 27 11/22/2022 1558   CO2  24 08/05/2013 1739   BUN 15 11/22/2022 1558   BUN 15 08/05/2013 1739   CREATININE 0.70 11/22/2022 1558   CREATININE 0.63 08/05/2013 1739   GLUCOSE 116 (H) 11/22/2022 1558   GLUCOSE 108 (H) 08/05/2013 1739   CALCIUM 9.6 11/22/2022 1558   CALCIUM 8.6 08/05/2013 1739      Latest Ref Rng & Units 11/22/2022    3:58 PM 12/27/2021    9:16 AM 09/28/2021   10:34 AM  Hepatic Function  Total Protein 6.0 - 8.3 g/dL 6.9  6.9  7.1   Albumin 3.5 - 5.2 g/dL 4.6  4.7  4.8   AST 0 - 37 U/L 42  36  42   ALT 0 - 35 U/L 44  37  48   Alk Phosphatase 39 - 117 U/L 61  56  74   Total Bilirubin 0.2 - 1.2 mg/dL 0.7  0.6  0.5   Bilirubin, Direct 0.0 - 0.3 mg/dL 0.2  0.1  0.1     Lab Results  Component Value Date   HGBA1C 7.8 (H) 11/22/2022   Lab Results  Component Value Date   TSH  1.86 11/22/2022    No results found.  Assessment and Plan:     ICD-10-CM   1. Healthcare maintenance  Z00.00     2. Type 2 diabetes, diet controlled (HCC)  E11.9     3. Hypothyroidism in adult  E03.9     4. Mixed hyperlipidemia  E78.2     5. Menopause  Z78.0 DG Bone Density    6. Lumbar radiculopathy, acute  M54.16 Ambulatory referral to Physical Medicine Rehab    7. Spinal stenosis of lumbar region with neurogenic claudication  M48.062 Ambulatory referral to Physical Medicine Rehab     She will get her tetanus booster at her pharmacy She is going to work on fitness, weight loss, and increasing her physical activity levels.  Her husband was here in the exam room today, he was also encouraging regarding fitness.  Thyroid stable Lipids stable Order DEXA scan  Medical management and medication management over and above health maintenance. 1.  Type 2 diabetes, unstable, A1c is at 7.8.  And can increase the patient's metformin so she is taking 2000 mg a day.  I will have her return in 3 months for diabetes recheck, so we can ensure normalcy. 2.  Chronic low back pain with radiculopathy.  Moderate spinal stenosis and multilevel degenerative disc disease.  At this point, she has failed virtually all manner of conservative care including physical therapy, multiple rounds of steroids, NSAIDs, muscle relaxants, Tylenol, and dedicated home rehab.  I am going to consult physical medicine and rehab, hopefully they will be able to offer additional intervention to help the patient.  I do not believe that this is a neurosurgical case.  Health Maintenance Exam: The patient's preventative maintenance and recommended screening tests for an annual wellness exam were reviewed in full today. Brought up to date unless services declined.  Counselled on the importance of diet, exercise, and its role in overall health and mortality. The patient's FH and SH was reviewed, including their home life, tobacco  status, and drug and alcohol status.  Follow-up in 1 year for physical exam or additional follow-up below.  I have personally reviewed the Medicare Annual Wellness questionnaire and have noted 1. The patient's medical and social history 2. Their use of alcohol, tobacco or illicit drugs 3. Their current medications and supplements 4. The patient's functional ability  including ADL's, fall risks, home safety risks and hearing or visual             impairment. 5. Diet and physical activities 6. Evidence for depression or mood disorders 7. Reviewed Updated provider list, see scanned forms and [facility] Snapshot.  8. Reviewed whether or not the patient has HCPOA or living will, and discussed what this means with the patient.  Recommended she bring in a copy for his chart in [facility].  The patients weight, height, BMI and visual acuity have been recorded in the chart I have made referrals, counseling and provided education to the patient based review of the above and I have provided the pt with a written personalized care plan for preventive services.  I have provided the patient with a copy of your personalized plan for preventive services. Instructed to take the time to review along with their updated medication list.  Disposition: Return in about 3 months (around 03/02/2023) for diabetes follow-up.  Future Appointments  Date Time Provider Department Center  12/11/2022 10:30 AM [facility] PREVISIT RM 50 [facility]  12/25/2022  9:30 AM Meryl Dare, MD [facility]    Meds ordered this encounter  Medications   metFORMIN (GLUCOPHAGE-XR) 500 MG 24 hr tablet    Sig: Take 4 tablets (2,000 mg total) by mouth daily with breakfast.    Dispense:  360 tablet    Refill:  1   Medications Discontinued During This Encounter  Medication Reason   predniSONE (DELTASONE) 20 MG tablet    gabapentin (NEURONTIN) 300 MG capsule Dose change   metFORMIN (GLUCOPHAGE-XR) 500 MG 24 hr tablet Reorder    candesartan (ATACAND) 4 MG tablet    Orders Placed This Encounter  Procedures   DG Bone Density   Ambulatory referral to Physical Medicine Rehab    Signed,  Karleen Hampshire T. Abel Hageman, MD   Allergies as of 11/30/2022       Reactions   Pseudoeph-doxylamine-dm-apap Anaphylaxis   Allergy to nyquil   Gluten Other (See Comments)   Gi upset   Penicillins Itching, Rash        Medication List        Accurate as of November 30, 2022 11:59 PM. If you have any questions, ask your nurse or doctor.          STOP taking these medications    candesartan 4 MG tablet Commonly known as: ATACAND Stopped by: Hannah Beat, MD   predniSONE 20 MG tablet Commonly known as: DELTASONE Stopped by: Hannah Beat, MD       TAKE these medications    Accu-Chek Guide test strip Generic drug: glucose blood Use to check blood sugar daily   Accu-Chek Guide w/Device Kit Use to check blood sugar daily   Accu-Chek Softclix Lancets lancets Use to check blood sugar daily   aspirin EC 81 MG tablet Take 81 mg by mouth daily.   celecoxib 200 MG capsule Commonly known as: CELEBREX TAKE 1 CAPSULE BY MOUTH DAILY   clobetasol ointment 0.05 % Commonly known as: TEMOVATE Apply 1 Application topically 2 (two) times daily as needed.   CULTURELLE ADULT ULT BALANCE PO   cyclobenzaprine 10 MG tablet Commonly known as: FLEXERIL TAKE 1 TABLET (10 MG TOTAL) BY MOUTH 2 (TWO) TIMES DAILY AS NEEDED (PAIN).   diazepam 5 MG tablet Commonly known as: VALIUM Take 1 tablet 30 minutes before MRI   Erenumab-aooe 140 MG/ML Soaj Inject into the skin.   fenofibrate 160 MG tablet TAKE ONE  TABLET BY MOUTH DAILY   gabapentin 300 MG capsule Commonly known as: NEURONTIN Take 600 mg by mouth 3 (three) times daily. What changed: Another medication with the same name was removed. Continue taking this medication, and follow the directions you see here. Changed by: Hannah Beat, MD   levothyroxine 75 MCG  tablet Commonly known as: SYNTHROID TAKE ONE TABLET BY MOUTH EVERY MORNING BEFORE BREAKFAST   Magnesium 250 MG Tabs Take 1 tablet by mouth daily.   metFORMIN 500 MG 24 hr tablet Commonly known as: GLUCOPHAGE-XR Take 4 tablets (2,000 mg total) by mouth daily with breakfast. What changed: how much to take Changed by: Hannah Beat, MD   metoprolol succinate 50 MG 24 hr tablet Commonly known as: TOPROL-XL TAKE ONE TABLET BY MOUTH DAILY WITH OR IMMEDIATELY FOLLOWING A MEAL   nitroGLYCERIN 0.4 MG SL tablet Commonly known as: NITROSTAT 1 tablet under tongue at onset of chest pain; you may repeat every 5 minutes for up to 3 doses.   ONE-A-DAY WOMENS PETITES PO Take 2 tablets by mouth daily.   pantoprazole 40 MG tablet Commonly known as: PROTONIX TAKE ONE TABLET BY MOUTH DAILY   pravastatin 40 MG tablet Commonly known as: PRAVACHOL TAKE ONE TABLET BY MOUTH AT BEDTIME   sertraline 50 MG tablet Commonly known as: ZOLOFT TAKE 1 TABLET BY MOUTH DAILY   traZODone 100 MG tablet Commonly known as: DESYREL TAKE 1 TABLET BY MOUTH AT BEDTIME

## 2022-11-30 ENCOUNTER — Encounter: Payer: Self-pay | Admitting: Family Medicine

## 2022-11-30 ENCOUNTER — Ambulatory Visit (INDEPENDENT_AMBULATORY_CARE_PROVIDER_SITE_OTHER): Payer: Medicare Other | Admitting: Family Medicine

## 2022-11-30 VITALS — BP 130/70 | HR 84 | Temp 97.5°F | Ht 63.5 in | Wt 171.4 lb

## 2022-11-30 DIAGNOSIS — E782 Mixed hyperlipidemia: Secondary | ICD-10-CM

## 2022-11-30 DIAGNOSIS — Z7984 Long term (current) use of oral hypoglycemic drugs: Secondary | ICD-10-CM

## 2022-11-30 DIAGNOSIS — M5416 Radiculopathy, lumbar region: Secondary | ICD-10-CM

## 2022-11-30 DIAGNOSIS — Z0001 Encounter for general adult medical examination with abnormal findings: Secondary | ICD-10-CM | POA: Diagnosis not present

## 2022-11-30 DIAGNOSIS — E039 Hypothyroidism, unspecified: Secondary | ICD-10-CM | POA: Diagnosis not present

## 2022-11-30 DIAGNOSIS — M48062 Spinal stenosis, lumbar region with neurogenic claudication: Secondary | ICD-10-CM | POA: Diagnosis not present

## 2022-11-30 DIAGNOSIS — E119 Type 2 diabetes mellitus without complications: Secondary | ICD-10-CM | POA: Diagnosis not present

## 2022-11-30 DIAGNOSIS — Z0000 Encounter for general adult medical examination without abnormal findings: Secondary | ICD-10-CM

## 2022-11-30 DIAGNOSIS — Z780 Asymptomatic menopausal state: Secondary | ICD-10-CM

## 2022-11-30 MED ORDER — METFORMIN HCL ER 500 MG PO TB24: 2000.0000 mg | ORAL_TABLET | Freq: Every day | ORAL | 1 refills | Status: DC

## 2022-11-30 NOTE — Patient Instructions (Signed)
Tetanus booster - get at the pharmacy

## 2022-12-01 ENCOUNTER — Encounter: Payer: Self-pay | Admitting: *Deleted

## 2022-12-05 DIAGNOSIS — R21 Rash and other nonspecific skin eruption: Secondary | ICD-10-CM | POA: Diagnosis not present

## 2022-12-05 DIAGNOSIS — L989 Disorder of the skin and subcutaneous tissue, unspecified: Secondary | ICD-10-CM | POA: Diagnosis not present

## 2022-12-05 DIAGNOSIS — L900 Lichen sclerosus et atrophicus: Secondary | ICD-10-CM | POA: Diagnosis not present

## 2022-12-11 ENCOUNTER — Encounter: Payer: Self-pay | Admitting: Gastroenterology

## 2022-12-11 ENCOUNTER — Ambulatory Visit (AMBULATORY_SURGERY_CENTER): Payer: Medicare Other

## 2022-12-11 VITALS — Ht 64.0 in | Wt 170.0 lb

## 2022-12-11 DIAGNOSIS — M5030 Other cervical disc degeneration, unspecified cervical region: Secondary | ICD-10-CM | POA: Diagnosis not present

## 2022-12-11 DIAGNOSIS — M5412 Radiculopathy, cervical region: Secondary | ICD-10-CM | POA: Diagnosis not present

## 2022-12-11 DIAGNOSIS — M5416 Radiculopathy, lumbar region: Secondary | ICD-10-CM | POA: Diagnosis not present

## 2022-12-11 DIAGNOSIS — M5136 Other intervertebral disc degeneration, lumbar region: Secondary | ICD-10-CM | POA: Diagnosis not present

## 2022-12-11 DIAGNOSIS — M47812 Spondylosis without myelopathy or radiculopathy, cervical region: Secondary | ICD-10-CM | POA: Diagnosis not present

## 2022-12-11 DIAGNOSIS — Z86010 Personal history of colonic polyps: Secondary | ICD-10-CM

## 2022-12-11 DIAGNOSIS — M48062 Spinal stenosis, lumbar region with neurogenic claudication: Secondary | ICD-10-CM | POA: Diagnosis not present

## 2022-12-11 MED ORDER — NA SULFATE-K SULFATE-MG SULF 17.5-3.13-1.6 GM/177ML PO SOLN
1.0000 | Freq: Once | ORAL | 0 refills | Status: AC
Start: 2022-12-11 — End: 2022-12-11

## 2022-12-11 MED ORDER — ONDANSETRON HCL 4 MG PO TABS
4.0000 mg | ORAL_TABLET | ORAL | 0 refills | Status: DC
Start: 2022-12-11 — End: 2023-08-24

## 2022-12-11 NOTE — Progress Notes (Signed)
No egg or soy allergy known to patient  No issues known to pt with past sedation with any surgeries or procedures Patient denies ever being told they had issues or difficulty with intubation  No FH of Malignant Hyperthermia Pt is not on diet pills Pt is not on  home 02  Pt is not on blood thinners  Pt denies issues with constipation  No A fib or A flutter Have any cardiac testing pending--no Pt instructed to use Singlecare.com or GoodRx for a price reduction on prep  Can ambulate independently Patient's chart reviewed by Cathlyn Parsons CNRA prior to previsit and patient appropriate for the [facility].  Previsit completed and red dot placed by patient's name on their procedure day (on provider's schedule).

## 2022-12-13 DIAGNOSIS — M5416 Radiculopathy, lumbar region: Secondary | ICD-10-CM | POA: Diagnosis not present

## 2022-12-13 DIAGNOSIS — M48062 Spinal stenosis, lumbar region with neurogenic claudication: Secondary | ICD-10-CM | POA: Diagnosis not present

## 2022-12-25 ENCOUNTER — Ambulatory Visit (AMBULATORY_SURGERY_CENTER): Payer: Medicare Other | Admitting: Gastroenterology

## 2022-12-25 ENCOUNTER — Encounter: Payer: Self-pay | Admitting: Gastroenterology

## 2022-12-25 VITALS — BP 110/60 | HR 66 | Temp 97.3°F | Resp 11 | Ht 63.5 in | Wt 170.0 lb

## 2022-12-25 DIAGNOSIS — Z800 Family history of malignant neoplasm of digestive organs: Secondary | ICD-10-CM

## 2022-12-25 DIAGNOSIS — D123 Benign neoplasm of transverse colon: Secondary | ICD-10-CM

## 2022-12-25 DIAGNOSIS — J45909 Unspecified asthma, uncomplicated: Secondary | ICD-10-CM | POA: Diagnosis not present

## 2022-12-25 DIAGNOSIS — I2510 Atherosclerotic heart disease of native coronary artery without angina pectoris: Secondary | ICD-10-CM | POA: Diagnosis not present

## 2022-12-25 DIAGNOSIS — Z86010 Personal history of colonic polyps: Secondary | ICD-10-CM | POA: Diagnosis not present

## 2022-12-25 DIAGNOSIS — Z09 Encounter for follow-up examination after completed treatment for conditions other than malignant neoplasm: Secondary | ICD-10-CM

## 2022-12-25 DIAGNOSIS — G4733 Obstructive sleep apnea (adult) (pediatric): Secondary | ICD-10-CM | POA: Diagnosis not present

## 2022-12-25 DIAGNOSIS — K635 Polyp of colon: Secondary | ICD-10-CM | POA: Diagnosis not present

## 2022-12-25 DIAGNOSIS — E119 Type 2 diabetes mellitus without complications: Secondary | ICD-10-CM | POA: Diagnosis not present

## 2022-12-25 MED ORDER — SODIUM CHLORIDE 0.9 % IV SOLN
500.0000 mL | Freq: Once | INTRAVENOUS | Status: DC
Start: 2022-12-25 — End: 2022-12-25

## 2022-12-25 NOTE — Progress Notes (Signed)
Pt's states no medical or surgical changes since previsit or office visit. 

## 2022-12-25 NOTE — Progress Notes (Signed)
Pt resting comfortably. VSS. Airway intact. SBAR complete to RN. All questions answered.   

## 2022-12-25 NOTE — Patient Instructions (Signed)
Handout provided on polyps.  Resume previous diet.  Continue present medications.  Await pathology results.  Repeat colonoscopy date to be determined after pending pathology results are reviewed, likely 5 years, for surveillance based on pathology results.   YOU HAD AN ENDOSCOPIC PROCEDURE TODAY AT THE [facility]:   Refer to the procedure report that was given to you for any specific questions about what was found during the examination.  If the procedure report does not answer your questions, please call your gastroenterologist to clarify.  If you requested that your care partner not be given the details of your procedure findings, then the procedure report has been included in a sealed envelope for you to review at your convenience later.  YOU SHOULD EXPECT: Some feelings of bloating in the abdomen. Passage of more gas than usual.  Walking can help get rid of the air that was put into your GI tract during the procedure and reduce the bloating. If you had a lower endoscopy (such as a colonoscopy or flexible sigmoidoscopy) you may notice spotting of blood in your stool or on the toilet paper. If you underwent a bowel prep for your procedure, you may not have a normal bowel movement for a few days.  Please Note:  You might notice some irritation and congestion in your nose or some drainage.  This is from the oxygen used during your procedure.  There is no need for concern and it should clear up in a day or so.  SYMPTOMS TO REPORT IMMEDIATELY:  Following lower endoscopy (colonoscopy or flexible sigmoidoscopy):  Excessive amounts of blood in the stool  Significant tenderness or worsening of abdominal pains  Swelling of the abdomen that is new, acute  Fever of 100F or higher  For urgent or emergent issues, a gastroenterologist can be reached at any hour by calling [phone]. Do not use MyChart messaging for urgent concerns.    DIET:  We do recommend a small meal at first, but  then you may proceed to your regular diet.  Drink plenty of fluids but you should avoid alcoholic beverages for 24 hours.  ACTIVITY:  You should plan to take it easy for the rest of today and you should NOT DRIVE or use heavy machinery until tomorrow (because of the sedation medicines used during the test).    FOLLOW UP: Our staff will call the number listed on your records the next business day following your procedure.  We will call around 7:15- 8:00 am to check on you and address any questions or concerns that you may have regarding the information given to you following your procedure. If we do not reach you, we will leave a message.     If any biopsies were taken you will be contacted by phone or by letter within the next 1-3 weeks.  Please call us at [phone] if you have not heard about the biopsies in 3 weeks.    SIGNATURES/CONFIDENTIALITY: You and/or your care partner have signed paperwork which will be entered into your electronic medical record.  These signatures attest to the fact that that the information above on your After Visit Summary has been reviewed and is understood.  Full responsibility of the confidentiality of this discharge information lies with you and/or your care-partner.

## 2022-12-25 NOTE — Progress Notes (Signed)
See 11/30/2022 H&P, no changes 

## 2022-12-25 NOTE — Progress Notes (Signed)
Called to room to assist during endoscopic procedure.  Patient ID and intended procedure confirmed with present staff. Received instructions for my participation in the procedure from the performing physician.  

## 2022-12-25 NOTE — Op Note (Signed)
[facility] Patient Name: Brandi Mahoney Procedure Date: 12/25/2022 9:26 AM MRN: [id number] Endoscopist: Meryl Dare , MD, [phone] Age: 67 Referring MD:  Date of Birth: 1955/09/25 Gender: Female Account #: [id number] Procedure:                Colonoscopy Indications:              Surveillance: Personal history of adenomatous and                            sessile serrated polyps on last colonoscopy 5 years                            ago, Family history of colon cancer, 1st-degree                            relative Medicines:                Monitored Anesthesia Care Procedure:                Pre-Anesthesia Assessment:                           - Prior to the procedure, a History and Physical                            was performed, and patient medications and                            allergies were reviewed. The patient's tolerance of                            previous anesthesia was also reviewed. The risks                            and benefits of the procedure and the sedation                            options and risks were discussed with the patient.                            All questions were answered, and informed consent                            was obtained. Prior Anticoagulants: The patient has                            taken no anticoagulant or antiplatelet agents. ASA                            Grade Assessment: III - A patient with severe                            systemic disease. After reviewing the risks and  benefits, the patient was deemed in satisfactory                            condition to undergo the procedure.                           After obtaining informed consent, the colonoscope                            was passed under direct vision. Throughout the                            procedure, the patient's blood pressure, pulse, and                            oxygen saturations were monitored  continuously. The                            Olympus CF-HQ190L [id number] [id number]) Colonoscope was                            introduced through the anus and advanced to the the                            cecum, identified by appendiceal orifice and                            ileocecal valve. The ileocecal valve, appendiceal                            orifice, and rectum were photographed. The quality                            of the bowel preparation was adequate. The                            colonoscopy was performed without difficulty. The                            patient tolerated the procedure well. Scope In: 9:34:04 AM Scope Out: 9:54:22 AM Scope Withdrawal Time: 0 hours 12 minutes 45 seconds  Total Procedure Duration: 0 hours 20 minutes 18 seconds  Findings:                 The perianal and digital rectal examinations were                            normal.                           A 7 mm polyp was found in the transverse colon. The                            polyp was sessile. The polyp was removed with a  cold snare. Resection and retrieval were complete.                           The exam was otherwise without abnormality on                            direct and retroflexion views. Complications:            No immediate complications. Estimated blood loss:                            None. Estimated Blood Loss:     Estimated blood loss: none. Impression:               - One 7 mm polyp in the transverse colon, removed                            with a cold snare. Resected and retrieved.                           - The examination was otherwise normal on direct                            and retroflexion views. Recommendation:           - Repeat colonoscopy date to be determined after                            pending pathology results are reviewed, likely 5                            years, for surveillance based on pathology results.                            - Patient has a contact number available for                            emergencies. The signs and symptoms of potential                            delayed complications were discussed with the                            patient. Return to normal activities tomorrow.                            Written discharge instructions were provided to the                            patient.                           - Resume previous diet.                           - Continue present medications.                           -  Await pathology results. Meryl Dare, MD 12/25/2022 10:03:02 AM This report has been signed electronically.

## 2022-12-26 ENCOUNTER — Telehealth: Payer: Self-pay

## 2022-12-26 NOTE — Telephone Encounter (Signed)
Follow up call placed, VM obtained and message left. 

## 2022-12-28 ENCOUNTER — Encounter: Payer: Medicare Other | Admitting: Gastroenterology

## 2023-01-01 DIAGNOSIS — M546 Pain in thoracic spine: Secondary | ICD-10-CM | POA: Diagnosis not present

## 2023-01-01 DIAGNOSIS — M542 Cervicalgia: Secondary | ICD-10-CM | POA: Diagnosis not present

## 2023-01-08 ENCOUNTER — Encounter: Payer: Self-pay | Admitting: Gastroenterology

## 2023-01-08 DIAGNOSIS — M542 Cervicalgia: Secondary | ICD-10-CM | POA: Diagnosis not present

## 2023-01-08 DIAGNOSIS — M546 Pain in thoracic spine: Secondary | ICD-10-CM | POA: Diagnosis not present

## 2023-01-10 DIAGNOSIS — M48062 Spinal stenosis, lumbar region with neurogenic claudication: Secondary | ICD-10-CM | POA: Diagnosis not present

## 2023-01-10 DIAGNOSIS — M47816 Spondylosis without myelopathy or radiculopathy, lumbar region: Secondary | ICD-10-CM | POA: Diagnosis not present

## 2023-01-10 DIAGNOSIS — M5416 Radiculopathy, lumbar region: Secondary | ICD-10-CM | POA: Diagnosis not present

## 2023-01-10 DIAGNOSIS — M5136 Other intervertebral disc degeneration, lumbar region: Secondary | ICD-10-CM | POA: Diagnosis not present

## 2023-01-12 DIAGNOSIS — M47816 Spondylosis without myelopathy or radiculopathy, lumbar region: Secondary | ICD-10-CM | POA: Diagnosis not present

## 2023-01-14 ENCOUNTER — Other Ambulatory Visit: Payer: Self-pay | Admitting: Family Medicine

## 2023-01-17 DIAGNOSIS — M542 Cervicalgia: Secondary | ICD-10-CM | POA: Diagnosis not present

## 2023-01-17 DIAGNOSIS — M546 Pain in thoracic spine: Secondary | ICD-10-CM | POA: Diagnosis not present

## 2023-01-18 ENCOUNTER — Other Ambulatory Visit: Payer: Self-pay | Admitting: Obstetrics & Gynecology

## 2023-01-18 DIAGNOSIS — Z1231 Encounter for screening mammogram for malignant neoplasm of breast: Secondary | ICD-10-CM

## 2023-01-22 DIAGNOSIS — M5416 Radiculopathy, lumbar region: Secondary | ICD-10-CM | POA: Diagnosis not present

## 2023-01-22 DIAGNOSIS — M5412 Radiculopathy, cervical region: Secondary | ICD-10-CM | POA: Diagnosis not present

## 2023-01-22 DIAGNOSIS — M7918 Myalgia, other site: Secondary | ICD-10-CM | POA: Diagnosis not present

## 2023-01-22 DIAGNOSIS — M48062 Spinal stenosis, lumbar region with neurogenic claudication: Secondary | ICD-10-CM | POA: Diagnosis not present

## 2023-01-22 DIAGNOSIS — M5126 Other intervertebral disc displacement, lumbar region: Secondary | ICD-10-CM | POA: Diagnosis not present

## 2023-01-22 DIAGNOSIS — M5136 Other intervertebral disc degeneration, lumbar region: Secondary | ICD-10-CM | POA: Diagnosis not present

## 2023-01-24 DIAGNOSIS — M546 Pain in thoracic spine: Secondary | ICD-10-CM | POA: Diagnosis not present

## 2023-01-24 DIAGNOSIS — M542 Cervicalgia: Secondary | ICD-10-CM | POA: Diagnosis not present

## 2023-01-26 DIAGNOSIS — M546 Pain in thoracic spine: Secondary | ICD-10-CM | POA: Diagnosis not present

## 2023-01-26 DIAGNOSIS — M542 Cervicalgia: Secondary | ICD-10-CM | POA: Diagnosis not present

## 2023-01-30 DIAGNOSIS — M542 Cervicalgia: Secondary | ICD-10-CM | POA: Diagnosis not present

## 2023-01-30 DIAGNOSIS — M5451 Vertebrogenic low back pain: Secondary | ICD-10-CM | POA: Diagnosis not present

## 2023-01-30 DIAGNOSIS — M546 Pain in thoracic spine: Secondary | ICD-10-CM | POA: Diagnosis not present

## 2023-02-01 DIAGNOSIS — M546 Pain in thoracic spine: Secondary | ICD-10-CM | POA: Diagnosis not present

## 2023-02-01 DIAGNOSIS — K121 Other forms of stomatitis: Secondary | ICD-10-CM | POA: Diagnosis not present

## 2023-02-01 DIAGNOSIS — K117 Disturbances of salivary secretion: Secondary | ICD-10-CM | POA: Diagnosis not present

## 2023-02-01 DIAGNOSIS — M5451 Vertebrogenic low back pain: Secondary | ICD-10-CM | POA: Diagnosis not present

## 2023-02-01 DIAGNOSIS — M542 Cervicalgia: Secondary | ICD-10-CM | POA: Diagnosis not present

## 2023-02-01 DIAGNOSIS — L900 Lichen sclerosus et atrophicus: Secondary | ICD-10-CM | POA: Diagnosis not present

## 2023-02-01 DIAGNOSIS — K140 Glossitis: Secondary | ICD-10-CM | POA: Diagnosis not present

## 2023-02-11 ENCOUNTER — Encounter: Payer: Self-pay | Admitting: Family Medicine

## 2023-02-12 MED ORDER — TIRZEPATIDE 2.5 MG/0.5ML ~~LOC~~ SOAJ: 2.5000 mg | SUBCUTANEOUS | 0 refills | Status: DC

## 2023-02-12 MED ORDER — TIRZEPATIDE 7.5 MG/0.5ML ~~LOC~~ SOAJ: 7.5000 mg | SUBCUTANEOUS | 3 refills | Status: DC

## 2023-02-12 MED ORDER — TIRZEPATIDE 5 MG/0.5ML ~~LOC~~ SOAJ: 5.0000 mg | SUBCUTANEOUS | 0 refills | Status: DC

## 2023-02-15 ENCOUNTER — Other Ambulatory Visit: Payer: Self-pay | Admitting: Family Medicine

## 2023-02-20 ENCOUNTER — Ambulatory Visit
Admission: RE | Admit: 2023-02-20 | Discharge: 2023-02-20 | Disposition: A | Discharge status: Home / Self Care | Payer: Medicare Other | Source: Ambulatory Visit | Attending: Obstetrics & Gynecology | Admitting: Obstetrics & Gynecology

## 2023-02-20 DIAGNOSIS — Z1231 Encounter for screening mammogram for malignant neoplasm of breast: Secondary | ICD-10-CM | POA: Insufficient documentation

## 2023-02-23 ENCOUNTER — Other Ambulatory Visit: Payer: Self-pay | Admitting: Family Medicine

## 2023-03-01 ENCOUNTER — Encounter: Payer: Self-pay | Admitting: Family Medicine

## 2023-03-08 ENCOUNTER — Other Ambulatory Visit: Payer: Self-pay | Admitting: Family Medicine

## 2023-03-12 DIAGNOSIS — L900 Lichen sclerosus et atrophicus: Secondary | ICD-10-CM | POA: Diagnosis not present

## 2023-03-12 DIAGNOSIS — Z79899 Other long term (current) drug therapy: Secondary | ICD-10-CM | POA: Diagnosis not present

## 2023-03-13 ENCOUNTER — Ambulatory Visit: Payer: Medicare Other | Admitting: Family Medicine

## 2023-03-14 ENCOUNTER — Ambulatory Visit: Payer: Medicare Other

## 2023-03-16 ENCOUNTER — Other Ambulatory Visit: Payer: Self-pay | Admitting: Family Medicine

## 2023-03-19 ENCOUNTER — Telehealth: Payer: Self-pay | Admitting: Family Medicine

## 2023-03-19 MED ORDER — TRAZODONE HCL 100 MG PO TABS: 100.0000 mg | ORAL_TABLET | Freq: Every day | ORAL | 5 refills | Status: DC

## 2023-03-19 MED ORDER — METFORMIN HCL ER 500 MG PO TB24: 2000.0000 mg | ORAL_TABLET | Freq: Every day | ORAL | 1 refills | Status: DC

## 2023-03-19 NOTE — Telephone Encounter (Signed)
Last OV: 11/30/2022 Pending OV: Nothing scheduled at this time  Medication Trazodone 100mg  Directions: Take 1 tablet qhs Last Refill: 11/27/2022 Qty: #30 with 2 refills   Medication Metformin ER 500mg  Directions: Take 4 tablets daily Last Refill: 11/30/2022 Qty: #360 with 1 refill

## 2023-03-19 NOTE — Addendum Note (Signed)
Addended by: Jaynee Eagles C on: 03/19/2023 11:34 AM   Modules accepted: Orders

## 2023-03-19 NOTE — Telephone Encounter (Signed)
Refills have been sent in per Dr. Patsy Lager.

## 2023-03-19 NOTE — Telephone Encounter (Signed)
I cannot tell if you refill these or not.  You should by protocol be allowed to refill 6 months supply.  If you would like for me to review further, can you please send as a refill request?

## 2023-03-26 DIAGNOSIS — K117 Disturbances of salivary secretion: Secondary | ICD-10-CM | POA: Diagnosis not present

## 2023-03-26 DIAGNOSIS — L900 Lichen sclerosus et atrophicus: Secondary | ICD-10-CM | POA: Diagnosis not present

## 2023-03-26 DIAGNOSIS — K121 Other forms of stomatitis: Secondary | ICD-10-CM | POA: Diagnosis not present

## 2023-04-03 ENCOUNTER — Telehealth: Payer: Self-pay

## 2023-04-03 NOTE — Patient Outreach (Signed)
  Care Coordination   Initial Visit Note   04/03/2023 Name: Brandi Mahoney MRN: [id number] DOB: 1955/08/29  Brandi Mahoney is a 67 y.o. year old female who sees Copland, Karleen Hampshire, MD for primary care. I spoke with  Brandi Mahoney by phone today.  What matters to the patients health and wellness today?  Patient denies having any nursing or care coordination needs.     Goals Addressed             This Visit's Progress    Care coordination activities - no follow up needed       Interventions Today    Flowsheet Row Most Recent Value  General Interventions   General Interventions Discussed/Reviewed General Interventions Discussed, Vaccines  [Care coordination services discussed. SDOH survey completed. AWV discussed and patient advised to contact provider office to schedule. Discussed vaccines. Advised to contact PCP office if care coordination services needed in the future.]              SDOH assessments and interventions completed:  Yes  SDOH Interventions Today    Flowsheet Row Most Recent Value  SDOH Interventions   Food Insecurity Interventions Intervention Not Indicated  Housing Interventions Intervention Not Indicated  Transportation Interventions Intervention Not Indicated        Care Coordination Interventions:  Yes, provided   Follow up plan: No further intervention required.   Encounter Outcome:  Patient Visit Completed   [facility] Care Coordination [phone] direct line

## 2023-04-13 DIAGNOSIS — L239 Allergic contact dermatitis, unspecified cause: Secondary | ICD-10-CM | POA: Diagnosis not present

## 2023-05-22 ENCOUNTER — Other Ambulatory Visit: Payer: Self-pay | Admitting: Family Medicine

## 2023-07-02 DIAGNOSIS — K117 Disturbances of salivary secretion: Secondary | ICD-10-CM | POA: Diagnosis not present

## 2023-07-02 DIAGNOSIS — K121 Other forms of stomatitis: Secondary | ICD-10-CM | POA: Diagnosis not present

## 2023-07-02 DIAGNOSIS — L900 Lichen sclerosus et atrophicus: Secondary | ICD-10-CM | POA: Diagnosis not present

## 2023-07-15 ENCOUNTER — Other Ambulatory Visit: Payer: Self-pay | Admitting: Family Medicine

## 2023-08-05 NOTE — Progress Notes (Unsigned)
   Ellard Nan T. Suzette Flagler, MD, CAQ Sports Medicine [facility] [address] [place], [address]  Phone: [phone]  FAX: [phone]  Brandi Mahoney - 68 y.o. female  MRN [id number]  Date of Birth: September 21, 1955  Date: 08/06/2023  PCP: Hannah Beat, MD  Referral: Hannah Beat, MD  No chief complaint on file.  Subjective:   Brandi Mahoney is a 68 y.o. very pleasant female patient with There is no height or weight on file to calculate BMI. who presents with the following:  Patient is here for persistent cough.  On chart review, she does have a history of asthma.  I actually saw her for a cough a little bit over a year ago, and at that point she had a normal chest x-ray.    Review of Systems is noted in the HPI, as appropriate  Objective:   There were no vitals taken for this visit.  GEN: No acute distress; alert,appropriate. PULM: Breathing comfortably in no respiratory distress PSYCH: Normally interactive.   Laboratory and Imaging Data:  Assessment and Plan:   ***

## 2023-08-06 ENCOUNTER — Ambulatory Visit (INDEPENDENT_AMBULATORY_CARE_PROVIDER_SITE_OTHER): Payer: Medicare Other | Admitting: Family Medicine

## 2023-08-06 ENCOUNTER — Ambulatory Visit (INDEPENDENT_AMBULATORY_CARE_PROVIDER_SITE_OTHER)
Admission: RE | Admit: 2023-08-06 | Discharge: 2023-08-06 | Disposition: A | Discharge status: Home / Self Care | Payer: Medicare Other | Source: Ambulatory Visit | Attending: Family Medicine | Admitting: Family Medicine

## 2023-08-06 ENCOUNTER — Encounter: Payer: Self-pay | Admitting: Family Medicine

## 2023-08-06 VITALS — BP 120/64 | HR 82 | Temp 97.5°F | Ht 63.5 in | Wt 156.2 lb

## 2023-08-06 DIAGNOSIS — R053 Chronic cough: Secondary | ICD-10-CM | POA: Diagnosis not present

## 2023-08-06 DIAGNOSIS — Z87891 Personal history of nicotine dependence: Secondary | ICD-10-CM | POA: Diagnosis not present

## 2023-08-06 DIAGNOSIS — E119 Type 2 diabetes mellitus without complications: Secondary | ICD-10-CM | POA: Diagnosis not present

## 2023-08-06 DIAGNOSIS — R059 Cough, unspecified: Secondary | ICD-10-CM | POA: Diagnosis not present

## 2023-08-06 LAB — POC INFLUENZA A&B (BINAX/QUICKVUE)
Influenza A, POC: NEGATIVE
Influenza B, POC: NEGATIVE

## 2023-08-06 LAB — POCT GLYCOSYLATED HEMOGLOBIN (HGB A1C): Hemoglobin A1C: 6.4 % — AB (ref 4.0–5.6)

## 2023-08-06 LAB — POC COVID19 BINAXNOW: SARS Coronavirus 2 Ag: NEGATIVE

## 2023-08-06 MED ORDER — HYDROCODONE BIT-HOMATROP MBR 5-1.5 MG/5ML PO SOLN: 5.0000 mL | Freq: Three times a day (TID) | ORAL | 0 refills | Status: DC | PRN

## 2023-08-17 DIAGNOSIS — M2550 Pain in unspecified joint: Secondary | ICD-10-CM | POA: Diagnosis not present

## 2023-08-17 DIAGNOSIS — R768 Other specified abnormal immunological findings in serum: Secondary | ICD-10-CM | POA: Diagnosis not present

## 2023-08-17 DIAGNOSIS — M3500 Sicca syndrome, unspecified: Secondary | ICD-10-CM | POA: Diagnosis not present

## 2023-08-17 DIAGNOSIS — L900 Lichen sclerosus et atrophicus: Secondary | ICD-10-CM | POA: Diagnosis not present

## 2023-08-20 ENCOUNTER — Encounter: Payer: Self-pay | Admitting: Family Medicine

## 2023-08-24 ENCOUNTER — Encounter: Payer: Self-pay | Admitting: Family Medicine

## 2023-08-24 DIAGNOSIS — Z860100 Personal history of colon polyps, unspecified: Secondary | ICD-10-CM

## 2023-08-24 MED ORDER — ONDANSETRON HCL 4 MG PO TABS: 4.0000 mg | ORAL_TABLET | ORAL | 1 refills | Status: DC

## 2023-08-24 NOTE — Telephone Encounter (Signed)
 Last office visit 08/06/2023 fir Chronic Cough.  Last refilled by Dr. Russella Dar 12/11/22 for #2 tablets with no refills.  Next Appt: No future appointments with PCP.

## 2023-09-11 ENCOUNTER — Other Ambulatory Visit: Payer: Self-pay | Admitting: Family Medicine

## 2023-09-17 ENCOUNTER — Emergency Department

## 2023-09-17 ENCOUNTER — Telehealth: Payer: Self-pay | Admitting: Cardiovascular Disease

## 2023-09-17 ENCOUNTER — Emergency Department
Admission: EM | Admit: 2023-09-17 | Discharge: 2023-09-17 | Disposition: A | Discharge status: Home / Self Care | Attending: Student in an Organized Health Care Education/Training Program | Admitting: Student in an Organized Health Care Education/Training Program

## 2023-09-17 ENCOUNTER — Other Ambulatory Visit: Payer: Self-pay

## 2023-09-17 DIAGNOSIS — R059 Cough, unspecified: Secondary | ICD-10-CM | POA: Diagnosis not present

## 2023-09-17 DIAGNOSIS — R0602 Shortness of breath: Secondary | ICD-10-CM | POA: Diagnosis not present

## 2023-09-17 DIAGNOSIS — I2510 Atherosclerotic heart disease of native coronary artery without angina pectoris: Secondary | ICD-10-CM | POA: Diagnosis not present

## 2023-09-17 DIAGNOSIS — I700 Atherosclerosis of aorta: Secondary | ICD-10-CM | POA: Diagnosis not present

## 2023-09-17 DIAGNOSIS — R0789 Other chest pain: Secondary | ICD-10-CM | POA: Insufficient documentation

## 2023-09-17 DIAGNOSIS — R079 Chest pain, unspecified: Secondary | ICD-10-CM | POA: Diagnosis not present

## 2023-09-17 LAB — BASIC METABOLIC PANEL WITH GFR
Anion gap: 11 (ref 5–15)
BUN: 16 mg/dL (ref 8–23)
CO2: 26 mmol/L (ref 22–32)
Calcium: 9.9 mg/dL (ref 8.9–10.3)
Chloride: 102 mmol/L (ref 98–111)
Creatinine, Ser: 0.71 mg/dL (ref 0.44–1.00)
GFR, Estimated: >60 mL/min (ref >60)
Glucose, Bld: 134 mg/dL — ABNORMAL HIGH (ref 70–99)
Potassium: 4 mmol/L (ref 3.5–5.1)
Sodium: 139 mmol/L (ref 135–145)

## 2023-09-17 LAB — CBC
HCT: 38.1 % (ref 36.0–46.0)
Hemoglobin: 12.4 g/dL (ref 12.0–15.0)
MCH: 26.8 pg (ref 26.0–34.0)
MCHC: 32.5 g/dL (ref 30.0–36.0)
MCV: 82.5 fL (ref 80.0–100.0)
Platelets: 323 10*3/uL (ref 150–400)
RBC: 4.62 MIL/uL (ref 3.87–5.11)
RDW: 13.7 % (ref 11.5–15.5)
WBC: 9.1 10*3/uL (ref 4.0–10.5)
nRBC: 0 % (ref 0.0–0.2)

## 2023-09-17 LAB — TROPONIN I (HIGH SENSITIVITY)
Troponin I (High Sensitivity): 5 ng/L (ref <18)
Troponin I (High Sensitivity): 5 ng/L (ref <18)

## 2023-09-17 MED ORDER — ALBUTEROL SULFATE HFA 108 (90 BASE) MCG/ACT IN AERS: 2.0000 | INHALATION_SPRAY | Freq: Four times a day (QID) | RESPIRATORY_TRACT | 2 refills | Status: AC | PRN

## 2023-09-17 MED ORDER — IPRATROPIUM-ALBUTEROL 0.5-2.5 (3) MG/3ML IN SOLN
3.0000 mL | Freq: Once | RESPIRATORY_TRACT | Status: AC
  Administered 2023-09-17: 3 mL via RESPIRATORY_TRACT
  Filled 2023-09-17: qty 3

## 2023-09-17 MED ORDER — IOHEXOL 350 MG/ML SOLN
75.0000 mL | Freq: Once | INTRAVENOUS | Status: AC | PRN
  Administered 2023-09-17: 75 mL via INTRAVENOUS

## 2023-09-17 NOTE — ED Provider Notes (Signed)
.-----------------------------------------   3:25 PM on 09/17/2023 -----------------------------------------  Blood pressure (!) 148/71, pulse 70, temperature 98.4 F (36.9 C), resp. rate 15, height 5\' 4"  (1.626 m), weight 72.6 kg, SpO2 100%.  Assuming care from Dr. Roxan Hockey.  In short, Brandi Mahoney is a 68 y.o. female with a chief complaint of Chest Pain and Shortness of Breath .  Refer to the original H&P for additional details.  The current plan of care is to follow up CTA, if neg likely dc.  On reassessment patient symptoms are improving, discussed imaging and lab results with her, she has a cardiologist appointment in July, discussed with her about calling her cardiologist to see if they can see her sooner, if not instructed her to follow-up with her primary care doctor this week for reassessment.  She does have a dry cough that appears chronic, she has an appointment with a pulmonologist in May.  Considered but no indication for inpatient admission at this time, she is safe for outpatient management.  Will discharge her strict turn precautions.  Shared decision making done with patient and she is agreeable with the plan.   Clinical Course as of 09/17/23 1632  Mon Sep 17, 2023  1413 Chest x-ray on my review and interpretation without evidence of pneumothorax. [PR]  1457 CTA on my review and interpretation without evidence of PE or infiltrate.  Will await formal radiology report. [PR]  1511 Patient be signed out to oncoming physician pending follow-up repeat troponin as well as CTA.  Patient remains in no acute distress. [PR]  1605 CT Angio Chest PE W and/or Wo Contrast IMPRESSION: 1. No evidence of pulmonary embolism. 2. No acute intrathoracic findings.   [TT]    Clinical Course User Index [PR] Willy Eddy, MD [TT] [name], MD      [name], MD 09/17/23 [phone]

## 2023-09-17 NOTE — ED Notes (Signed)
 Family with pt  breathing treatment given

## 2023-09-17 NOTE — ED Notes (Signed)
 Resumed care from annie rn.  Pt alert  er md at bedside.  Pt reports pressure in chest.

## 2023-09-17 NOTE — ED Notes (Signed)
 Urine sent to lab

## 2023-09-17 NOTE — ED Provider Notes (Signed)
 [facility] Provider Note    Event Date/Time   First MD Initiated Contact with Patient 09/17/23 1248     (approximate)   History   Chest Pain and Shortness of Breath   HPI  Brandi Mahoney is a 68 y.o. female history of depression anxiety, myocarditis, remote smoking history presents to the ER for evaluation of several days of chest discomfort or shortness of breath and tightness.  Denies any recent stressors.  No cough.  Did coordinate a outpatient follow-up with pulmonology given her symptoms but wanted to be evaluated prior to that given her history of myocarditis.  Denies any lower extremity swelling.  No abdominal pain has had some nausea.  No fevers or chills.     Physical Exam   Triage Vital Signs: ED Triage Vitals  Encounter Vitals Group     BP 09/17/23 1232 (!) 153/72     Systolic BP Percentile --      Diastolic BP Percentile --      Pulse Rate 09/17/23 1232 69     Resp 09/17/23 1232 18     Temp 09/17/23 1232 98.4 F (36.9 C)     Temp src --      SpO2 09/17/23 1232 100 %     Weight 09/17/23 1231 160 lb (72.6 kg)     Height 09/17/23 1231 5\' 4"  (1.626 m)     Head Circumference --      Peak Flow --      Pain Score 09/17/23 1231 5     Pain Loc --      Pain Education --      Exclude from Growth Chart --     Most recent vital signs: Vitals:   09/17/23 1400 09/17/23 1525  BP: (!) 148/71 (!) 154/59  Pulse:  67  Resp: 15 (!) 22  Temp:    SpO2: 100% 100%     Constitutional: Alert  Eyes: Conjunctivae are normal.  Head: Atraumatic. Nose: No congestion/rhinnorhea. Mouth/Throat: Mucous membranes are moist.   Neck: Painless ROM.  Cardiovascular:   Good peripheral circulation. Respiratory: Normal respiratory effort.  No retractions.  Gastrointestinal: Soft and nontender.  Musculoskeletal:  no deformity Neurologic:  MAE spontaneously. No gross focal neurologic deficits are appreciated.  Skin:  Skin is warm, dry and intact. No rash  noted. Psychiatric: Mood and affect are normal. Speech and behavior are normal.    ED Results / Procedures / Treatments   Labs (all labs ordered are listed, but only abnormal results are displayed) Labs Reviewed  BASIC METABOLIC PANEL WITH GFR - Abnormal; Notable for the following components:      Result Value   Glucose, Bld 134 (*)    All other components within normal limits  CBC  TROPONIN I (HIGH SENSITIVITY)  TROPONIN I (HIGH SENSITIVITY)     EKG  ED ECG REPORT I, Willy Eddy, the attending physician, personally viewed and interpreted this ECG.   Date: 09/17/2023  EKG Time: 12:34  Rate: 65  Rhythm: sinus  Axis: left  Intervals: lbbb  ST&T Change: no stemi, no depressions    RADIOLOGY Please see ED Course for my review and interpretation.  I personally reviewed all radiographic images ordered to evaluate for the above acute complaints and reviewed radiology reports and findings.  These findings were personally discussed with the patient.  Please see medical record for radiology report.    PROCEDURES:  Critical Care performed:   Procedures   MEDICATIONS ORDERED IN  ED: Medications  ipratropium-albuterol (DUONEB) 0.5-2.5 (3) MG/3ML nebulizer solution 3 mL (has no administration in time range)  iohexol (OMNIPAQUE) 350 MG/ML injection 75 mL (75 mLs Intravenous Contrast Given 09/17/23 1430)     IMPRESSION / MDM / ASSESSMENT AND PLAN / ED COURSE  I reviewed the triage vital signs and the nursing notes.                              Differential diagnosis includes, but is not limited to, ACS, pericarditis, esophagitis, boerhaaves, pe, dissection, pna, bronchitis, costochondritis  Patient presenting to the ER for evaluation of symptoms as described above.  Based on symptoms, risk factors and considered above differential, this presenting complaint could reflect a potentially life-threatening illness therefore the patient will be placed on continuous pulse  oximetry and telemetry for monitoring.  Laboratory evaluation will be sent to evaluate for the above complaints.      Clinical Course as of 09/17/23 1529  Mon Sep 17, 2023  1413 Chest x-ray on my review and interpretation without evidence of pneumothorax. [PR]  1457 CTA on my review and interpretation without evidence of PE or infiltrate.  Will await formal radiology report. [PR]  1511 Patient be signed out to oncoming physician pending follow-up repeat troponin as well as CTA.  Patient remains in no acute distress. [PR]    Clinical Course User Index [PR] Willy Eddy, MD     FINAL CLINICAL IMPRESSION(S) / ED DIAGNOSES   Final diagnoses:  Atypical chest pain     Rx / DC Orders   ED Discharge Orders          Ordered    albuterol (VENTOLIN HFA) 108 (90 Base) MCG/ACT inhaler  Every 6 hours PRN        09/17/23 1527             Note:  This document was prepared using Dragon voice recognition software and may include unintentional dictation errors.    Willy Eddy, MD 09/17/23 [phone]

## 2023-09-17 NOTE — ED Triage Notes (Signed)
 Pt comes with chest pain and sob. Pt states this started over the weekend. Pt states left shoulder pain also the patient states right sided of back too.

## 2023-09-17 NOTE — Discharge Instructions (Signed)
 Please call your cardiologist to see if they can see you earlier, if not please follow-up with your primary care doctor to be reassessed this week.  Please make sure to go to your pulmonologist appointment for further workup of your chronic cough.

## 2023-09-17 NOTE — Telephone Encounter (Signed)
   Pt c/o of Chest Pain: STAT if active CP, including tightness, pressure, jaw pain, radiating pain to shoulder/upper arm/back, CP unrelieved by Nitro. Symptoms reported of SOB, nausea, vomiting, sweating.  1. Are you having CP right now?  Chest tightness, yes    2. Are you experiencing any other symptoms (ex. SOB, nausea, vomiting, sweating)? SOB    3. Is your CP continuous or coming and going? Continuous    4. Have you taken Nitroglycerin? No    5. How long have you been experiencing CP? 2 days ago     6. If NO CP at time of call then end call with telling Pt to call back or call 911 if Chest pain returns prior to return call from triage team.

## 2023-09-17 NOTE — Telephone Encounter (Signed)
 Per Triage Elon Jester, RN it has been over 3 years she is considered NP. Referred pt to the ED and offered to still make a NP appt just so she has something on the books. Pt declined.

## 2023-09-20 ENCOUNTER — Ambulatory Visit (INDEPENDENT_AMBULATORY_CARE_PROVIDER_SITE_OTHER): Admitting: Family Medicine

## 2023-09-20 ENCOUNTER — Encounter: Payer: Self-pay | Admitting: Family Medicine

## 2023-09-20 VITALS — BP 130/72 | HR 76 | Temp 97.3°F | Ht 63.5 in | Wt 158.2 lb

## 2023-09-20 DIAGNOSIS — I25119 Atherosclerotic heart disease of native coronary artery with unspecified angina pectoris: Secondary | ICD-10-CM

## 2023-09-20 DIAGNOSIS — R5383 Other fatigue: Secondary | ICD-10-CM | POA: Diagnosis not present

## 2023-09-20 DIAGNOSIS — R079 Chest pain, unspecified: Secondary | ICD-10-CM | POA: Diagnosis not present

## 2023-09-20 DIAGNOSIS — I447 Left bundle-branch block, unspecified: Secondary | ICD-10-CM | POA: Diagnosis not present

## 2023-09-20 DIAGNOSIS — R0609 Other forms of dyspnea: Secondary | ICD-10-CM

## 2023-09-20 DIAGNOSIS — R0602 Shortness of breath: Secondary | ICD-10-CM

## 2023-09-20 NOTE — Progress Notes (Signed)
 Brandi Carlo T. Blimi Godby, MD, CAQ Sports Medicine [facility] [address] [place], [address]  Phone: [phone]  FAX: [phone]  Brandi Mahoney - 68 y.o. female  MRN [id number]  Date of Birth: [birth year]  Date: 09/20/2023  PCP: Brandi Beat, MD  Referral: Brandi Beat, MD  Chief Complaint  Patient presents with   Follow-up    CP/SOB   Subjective:   Brandi Mahoney is a 68 y.o. very pleasant female patient with Body mass index is 27.59 kg/m. who presents with the following:  09/17/2023 - ER for Chest pain and SOB  By history, the patient had some substernal chest pain and pressure, shortness of breath at baseline and worsening shortness of breath on exertion.  She also had some radiating pain down the left arm, and she went to the emergency room at [facility].  At that time, and still today she feels very short of breath and worsening short of breath when she even does basic walking.  She no longer has the pressure on her chest.  She no longer has pain in the left arm.  She has a known history coronary artery disease, and Dr. Mariah Milling is her primary cardiologist. 2020: Her most recent nuclear stress test showed no significant ischemia.  Low risk scan. 2019, echocardiogram.  EF 65 to 70%.  She did have grade 1 diastolic dysfunction. 2011, cardiac catheterization, nonobstructive left circumflex disease.  Mild diffuse LAD disease.  Viral myocarditis.  She is on multiple medications with cardiovascular impact. Tricor 160 mg, metoprolol 50 mg a day She does have sublingual nitroglycerin available.   She is on Pravachol 40 mg.  She does have stable diabetes Lab Results  Component Value Date   HGBA1C 6.4 (A) 08/06/2023     Dry cough - upcoming pulmonary appt in 10/2023 Upcoming Cardiology appt in July  CXR normal  CTA negative Trop neg x 2 CBC and BMP normal   Aortic atherosclerosis since 2018  She is  chronically sob but worse Now very different with severe SOB DOE and makes it much worse    Review of Systems is noted in the HPI, as appropriate  Patient Active Problem List   Diagnosis Date Noted   Coronary artery disease involving native coronary artery of native heart with angina pectoris (HCC)     Priority: High   Type 2 Diabetes     Priority: High   Mixed hyperlipidemia 04/01/2010    Priority: High   HYPERTRIGLYCERIDEMIA 07/21/2009    Priority: High   Hypothyroidism in adult 09/08/2008    Priority: High   Obstructive sleep apnea 09/25/2013    Priority: Medium    Tobacco abuse, in remission 09/19/2013    Priority: Medium    Major depressive disorder, recurrent episode, in full remission (HCC) 09/08/2008    Priority: Medium    Lichen sclerosus 03/13/2017   GAD (generalized anxiety disorder)    IBS (irritable bowel syndrome) 09/26/2012   Viral myocarditis, 01/2010 01/27/2010   Asthma 09/08/2008    Past Medical History:  Diagnosis Date   CAD (coronary artery disease)    a. 01/2010 Cath: mild atherosclerosis in the mid LCX after takeoff of OM1, mild diff LAD dzs;  b. 07/2013 MV: EF 67%, no ischemia.   GAD (generalized anxiety disorder)    History of kidney stones    HLD (hyperlipidemia)    Hypothyroidism    Major depressive disorder, recurrent episode, in full remission (HCC)  09/08/2008   Qualifier: Diagnosis of  By: Patsy Lager MD, Tonie Brandi     Migraine headache    Myocarditis (HCC)    a. 01/2010 presented w/ c/p and elev trop-->Cath: mild atherosclerosis in the mid LCX after takeoff of OM1, mild diff LAD dzs;  b. 03/2010 Echo: EF 60-65%, DD, mild LVH;  c. 07/2011 Echo: EF 55-60%, mild LVH, no rwma.   Sleep apnea    has c-pap.  wears off and on - not lately  using  per reported 07-29-2020   Type 2 diabetes, diet controlled [facility])     Past Surgical History:  Procedure Laterality Date   APPENDECTOMY  1974   BREAST SURGERY Right 1988   fatty tumore from breast   CARDIAC  CATHETERIZATION  8/11   no significant -[facility] Dr Mariah Milling,   CHOLECYSTECTOMY  2002   COLONOSCOPY  2017   CYST EXCISION     ovarian cyst 1994   NEPHROLITHOTOMY Right 08/20/2014   Procedure: RIGHT PERCUTANEOUS NEPHROLITHOTOMY ;  Surgeon: Anner Crete, MD;  Location: [facility];  Service: Urology;  Laterality: Right;   POLYPECTOMY     RECTAL EXAM UNDER ANESTHESIA N/A 08/02/2020   Procedure: Brandi Mahoney EXAM UNDER ANESTHESIA INJECTION OF PERIANAL BOTOX;  Surgeon: Andria Meuse, MD;  Location: [facility];  Service: General;  Laterality: N/A;   TOTAL ABDOMINAL HYSTERECTOMY W/ BILATERAL SALPINGOOPHORECTOMY  2000   no ovaries, uterus, or cervix.    Family History  Problem Relation Age of Onset   Coronary artery disease Mother    Diabetes Mother    ALS Mother    Hyperlipidemia Father    Breast cancer Sister 10   Stomach cancer Maternal Grandfather    Colon cancer Maternal Grandfather        dx early 79's    Breast cancer Paternal Grandmother    Lung cancer Paternal Grandfather        CAD   Other Daughter        gluten   Colon cancer Son 47       3rd stage   Down syndrome Son    Esophageal cancer Neg Hx    Liver cancer Neg Hx    Pancreatic cancer Neg Hx    Rectal cancer Neg Hx     Social History   Social History Narrative   Lives in [place] w/ husband.  No reg exercise.     Objective:   BP 130/72 (BP Location: Right Arm, Patient Position: Sitting, Cuff Size: Normal)   Pulse 76   Temp (!) 97.3 F (36.3 C) (Temporal)   Ht 5' 3.5" (1.613 m)   Wt 158 lb 4 oz (71.8 kg)   SpO2 94%   BMI 27.59 kg/m   GEN: No acute distress; alert,appropriate. CV: RRR, no m/g/r  PULM: Normal respiratory rate, no accessory muscle use. No wheezes, crackles or rhonchi  PSYCH: Normally interactive.   Laboratory and Imaging Data:  Assessment and Plan:     ICD-10-CM   1. DOE (dyspnea on exertion)  R06.09 Ambulatory referral to Cardiology    2. Chest pain at rest  R07.9  Ambulatory referral to Cardiology    3. Shortness of breath  R06.02 Ambulatory referral to Cardiology    4. Left bundle branch block  I44.7 Ambulatory referral to Cardiology    5. Other fatigue  R53.83 Ambulatory referral to Cardiology    6. Coronary artery disease involving native coronary artery of native heart with angina pectoris (HCC)  I25.119  Ambulatory referral to Cardiology     Severe shortness of breath and more severe shortness of breath on exertion. She was having substernal chest pressure and left-sided arm pain when she went to the emergency room, however she did have a set of normal troponins.  She did have a CT angiogram of the chest which showed no pulmonary embolism. She does have known nonobstructive coronary disease, last cardiac catheterization 2011.  She also within the last week or so has been extremely fatigued.  On EKG to me, it does look like she has a left bundle branch block.  Workup with CT angiogram of the chest is essentially unremarkable.  Clinical concern for ongoing cardiac disease, concern with worsening to severe shortness of breath, even worsening with any exertion.  She has had some substernal chest pressure as well as extreme fatigue.  We have asked cardiology to see her ASAP.  Medication Management during today's office visit: No orders of the defined types were placed in this encounter.  Medications Discontinued During This Encounter  Medication Reason   ondansetron (ZOFRAN) 4 MG tablet Completed Course   HYDROcodone bit-homatropine (HYCODAN) 5-1.5 MG/5ML syrup Completed Course    Orders placed today for conditions managed today: Orders Placed This Encounter  Procedures   Ambulatory referral to Cardiology    Disposition: No follow-ups on file.  Dr[employer] One speech-to-text software was used for transcription in this dictation.  Possible transcriptional errors can occur using Animal nutritionist.   Signed,  Elpidio Galea. Brandi Schellhorn,  MD   Outpatient Encounter Medications as of 09/20/2023  Medication Sig   Accu-Chek Softclix Lancets lancets Use to check blood sugar daily   albuterol (VENTOLIN HFA) 108 (90 Base) MCG/ACT inhaler Inhale 2 puffs into the lungs every 6 (six) hours as needed for wheezing or shortness of breath.   aspirin EC 81 MG tablet Take 81 mg by mouth daily.   Blood Glucose Monitoring Suppl (ACCU-CHEK GUIDE) w/Device KIT Use to check blood sugar daily   clobetasol ointment (TEMOVATE) 0.05 % Apply 1 Application topically 2 (two) times daily as needed.   cyclobenzaprine (FLEXERIL) 10 MG tablet TAKE 1 TABLET (10 MG TOTAL) BY MOUTH 2 (TWO) TIMES DAILY AS NEEDED (PAIN).   fenofibrate 160 MG tablet TAKE 1 TABLET BY MOUTH DAILY   gabapentin (NEURONTIN) 300 MG capsule Take 300 mg by mouth at bedtime.   glucose blood (ACCU-CHEK GUIDE) test strip Use to check blood sugar daily   Lactobacillus-Inulin (CULTURELLE ADULT ULT BALANCE PO)    levothyroxine (SYNTHROID) 75 MCG tablet TAKE ONE TABLET BY MOUTH EVERY MORNING BEFORE BREAKFAST   Magnesium 250 MG TABS Take 1 tablet by mouth daily.   metFORMIN (GLUCOPHAGE-XR) 500 MG 24 hr tablet TAKE FOUR TABLETS BY MOUTH EVERY MORNING WITH BREAKFAST   metoprolol succinate (TOPROL-XL) 50 MG 24 hr tablet TAKE ONE TABLET BY MOUTH DAILY WITH OR IMMEDIATELY FOLLOWING A MEAL   Multiple Vitamins-Minerals (ONE-A-DAY WOMENS PETITES PO) Take 2 tablets by mouth daily.   nitroGLYCERIN (NITROSTAT) 0.4 MG SL tablet 1 tablet under tongue at onset of chest pain; you may repeat every 5 minutes for up to 3 doses.   pantoprazole (PROTONIX) 40 MG tablet TAKE 1 TABLET BY MOUTH DAILY   pravastatin (PRAVACHOL) 40 MG tablet TAKE 1 TABLET BY MOUTH AT BEDTIME   sertraline (ZOLOFT) 50 MG tablet TAKE 1 TABLET BY MOUTH DAILY   traZODone (DESYREL) 100 MG tablet Take 1 tablet (100 mg total) by mouth at bedtime.   triamcinolone ointment (  KENALOG) 0.1 % Apply topically.   [DISCONTINUED] HYDROcodone bit-homatropine  (HYCODAN) 5-1.5 MG/5ML syrup Take 5 mLs by mouth every 8 (eight) hours as needed for cough.   [DISCONTINUED] ondansetron (ZOFRAN) 4 MG tablet Take 1 tablet (4 mg total) by mouth as directed. Take one Zofran 4 mg tablet 30-60 minutes before each colonoscopy prep dose   No facility-administered encounter medications on file as of 09/20/2023.

## 2023-09-24 ENCOUNTER — Encounter: Payer: Self-pay | Admitting: Cardiovascular Disease

## 2023-09-24 ENCOUNTER — Ambulatory Visit: Attending: Cardiovascular Disease | Admitting: Cardiovascular Disease

## 2023-09-24 VITALS — BP 132/78 | HR 68 | Ht 63.5 in | Wt 156.6 lb

## 2023-09-24 DIAGNOSIS — I25110 Atherosclerotic heart disease of native coronary artery with unstable angina pectoris: Secondary | ICD-10-CM | POA: Diagnosis not present

## 2023-09-24 NOTE — Progress Notes (Signed)
 Chief Complaint  Patient presents with   New Patient (Initial Visit)    Chest pain, dyspnea   History of Present Illness: 68 yo female with history of mild CAD, HLD, anxiety, depression, DM2, sleep apnea who is here today for follow up. She has been followed in our office by Dr. Mariah Milling. She has not been seen in our office since January 2022 so she is put on my DOD schedule today as a new patient. She was admitted in 2011 with chest pain and elevated troponin, Cardiac cath with mild CAD. Her presentation was felt to be secondary to a viral myocarditis. Echo in April 2019 with LVEF=65-70%. No valve disease. Nuclear stress test in May 2020 with no ischemia. She was seen in the ED on 09/17/23 with c/o chest pain. Troponin negative. EKG with sinus, new LBBB. Chest CTA with no PE or aortic dissection.   She tells me today that she has been having chest pressure and dyspnea with exertion for the past 10 days. She describes heaviness in the center of her chest, worsened with exertion. Some radiation of pain into her left arm.   Primary Care Physician: Hannah Beat, MD  [facility] Cardiologist: Dr. Mariah Milling  Past Medical History:  Diagnosis Date   CAD (coronary artery disease)    a. 01/2010 Cath: mild atherosclerosis in the mid LCX after takeoff of OM1, mild diff LAD dzs;  b. 07/2013 MV: EF 67%, no ischemia.   GAD (generalized anxiety disorder)    History of kidney stones    HLD (hyperlipidemia)    Hypothyroidism    Major depressive disorder, recurrent episode, in full remission (HCC) 09/08/2008   Qualifier: Diagnosis of  By: Patsy Lager MD, [place]     Migraine headache    Myocarditis (HCC)    a. 01/2010 presented w/ c/p and elev trop-->Cath: mild atherosclerosis in the mid LCX after takeoff of OM1, mild diff LAD dzs;  b. 03/2010 Echo: EF 60-65%, DD, mild LVH;  c. 07/2011 Echo: EF 55-60%, mild LVH, no rwma.   Sleep apnea    has c-pap.  wears off and on - not lately  using  per reported 07-29-2020   Type 2  diabetes, diet controlled [facility])     Past Surgical History:  Procedure Laterality Date   APPENDECTOMY  1974   BREAST SURGERY Right 1988   fatty tumore from breast   CARDIAC CATHETERIZATION  01/2010   no significant -[facility]- Dr Mariah Milling,   CHOLECYSTECTOMY  2002   COLONOSCOPY  2017   CYST EXCISION     ovarian cyst 1994   NEPHROLITHOTOMY Right 08/20/2014   Procedure: RIGHT PERCUTANEOUS NEPHROLITHOTOMY ;  Surgeon: Anner Crete, MD;  Location: [facility];  Service: Urology;  Laterality: Right;   POLYPECTOMY     RECTAL EXAM UNDER ANESTHESIA N/A 08/02/2020   Procedure: Lurena Joiner EXAM UNDER ANESTHESIA INJECTION OF PERIANAL BOTOX;  Surgeon: Andria Meuse, MD;  Location: [facility];  Service: General;  Laterality: N/A;   TOTAL ABDOMINAL HYSTERECTOMY W/ BILATERAL SALPINGOOPHORECTOMY  2000   no ovaries, uterus, or cervix.    Current Outpatient Medications  Medication Sig Dispense Refill   Accu-Chek Softclix Lancets lancets Use to check blood sugar daily 100 each 3   albuterol (VENTOLIN HFA) 108 (90 Base) MCG/ACT inhaler Inhale 2 puffs into the lungs every 6 (six) hours as needed for wheezing or shortness of breath. 8 g 2   aspirin EC 81 MG tablet Take 81 mg by mouth daily.  Blood Glucose Monitoring Suppl (ACCU-CHEK GUIDE) w/Device KIT Use to check blood sugar daily 1 kit 0   clobetasol ointment (TEMOVATE) 0.05 % Apply 1 Application topically 2 (two) times daily as needed.     cyclobenzaprine (FLEXERIL) 10 MG tablet TAKE 1 TABLET (10 MG TOTAL) BY MOUTH 2 (TWO) TIMES DAILY AS NEEDED (PAIN).     fenofibrate 160 MG tablet TAKE 1 TABLET BY MOUTH DAILY 90 tablet 3   gabapentin (NEURONTIN) 300 MG capsule Take 300 mg by mouth at bedtime.     glucose blood (ACCU-CHEK GUIDE) test strip Use to check blood sugar daily 100 each 3   Lactobacillus-Inulin (CULTURELLE ADULT ULT BALANCE PO)      levothyroxine (SYNTHROID) 75 MCG tablet TAKE ONE TABLET BY MOUTH EVERY MORNING BEFORE BREAKFAST  90 tablet 3   Magnesium 250 MG TABS Take 1 tablet by mouth daily.     metFORMIN (GLUCOPHAGE-XR) 500 MG 24 hr tablet TAKE FOUR TABLETS BY MOUTH EVERY MORNING WITH BREAKFAST 360 tablet 0   metoprolol succinate (TOPROL-XL) 50 MG 24 hr tablet TAKE ONE TABLET BY MOUTH DAILY WITH OR IMMEDIATELY FOLLOWING A MEAL 30 tablet 0   Multiple Vitamins-Minerals (ONE-A-DAY WOMENS PETITES PO) Take 2 tablets by mouth daily.     nitroGLYCERIN (NITROSTAT) 0.4 MG SL tablet 1 tablet under tongue at onset of chest pain; you may repeat every 5 minutes for up to 3 doses. 25 tablet 3   pantoprazole (PROTONIX) 40 MG tablet TAKE 1 TABLET BY MOUTH DAILY 90 tablet 3   pravastatin (PRAVACHOL) 40 MG tablet TAKE 1 TABLET BY MOUTH AT BEDTIME 90 tablet 1   sertraline (ZOLOFT) 50 MG tablet TAKE 1 TABLET BY MOUTH DAILY 90 tablet 1   traZODone (DESYREL) 100 MG tablet Take 1 tablet (100 mg total) by mouth at bedtime. 30 tablet 5   triamcinolone ointment (KENALOG) 0.1 % Apply topically.     No current facility-administered medications for this visit.    Allergies  Allergen Reactions   Pseudoeph-Doxylamine-Dm-Apap Anaphylaxis    Allergy to nyquil   Gluten Other (See Comments)    Gi upset   Penicillins Itching and Rash    Social History   Socioeconomic History   Marital status: Married    Spouse name: Alinda Money   Number of children: 3   Years of education: Not on file   Highest education level: Not on file  Occupational History   Occupation: retired    Comment: [employer]  Tobacco Use   Smoking status: Former    Current packs/day: 0.00    Average packs/day: 0.4 packs/day for 35.0 years (12.3 ttl pk-yrs)    Types: Cigarettes    Start date: 09/25/1973    Quit date: 09/25/2008    Years since quitting: 15.0   Smokeless tobacco: Never   Tobacco comments:    quit in 2007-2008, social smoker  Vaping Use   Vaping status: Never Used  Substance and Sexual Activity   Alcohol use: Not Currently   Drug use: No   Sexual activity:  Not Currently  Other Topics Concern   Not on file  Social History Narrative   Lives in [place] w/ husband.  No reg exercise.   Social Drivers of Corporate investment banker Strain: Not on file  Food Insecurity: No Food Insecurity (04/03/2023)   Hunger Vital Sign    Worried About Running Out of Food in the Last Year: Never true    Ran Out of Food in the Last Year:  Never true  Transportation Needs: No Transportation Needs (04/03/2023)   PRAPARE - Administrator, Civil Service (Medical): No    Lack of Transportation (Non-Medical): No  Physical Activity: Not on file  Stress: Not on file  Social Connections: Not on file  Intimate Partner Violence: Not on file    Family History  Problem Relation Age of Onset   Coronary artery disease Mother    Diabetes Mother    ALS Mother    Hyperlipidemia Father    Breast cancer Sister 23   Stomach cancer Maternal Grandfather    Colon cancer Maternal Grandfather        dx early 64's    Breast cancer Paternal Grandmother    Lung cancer Paternal Grandfather        CAD   Other Daughter        gluten   Colon cancer Son 18       3rd stage   Down syndrome Son    Esophageal cancer Neg Hx    Liver cancer Neg Hx    Pancreatic cancer Neg Hx    Rectal cancer Neg Hx     Review of Systems:  As stated in the HPI and otherwise negative.   BP 132/78   Pulse 68   Ht 5' 3.5" (1.613 m)   Wt 71 kg   SpO2 97%   BMI 27.31 kg/m   Physical Examination: General: Well developed, well nourished, NAD  HEENT: OP clear, mucus membranes moist  SKIN: warm, dry. No rashes. Neuro: No focal deficits  Musculoskeletal: Muscle strength 5/5 all ext  Psychiatric: Mood and affect normal  Neck: No JVD, no carotid bruits, no thyromegaly, no lymphadenopathy.  Lungs:Clear bilaterally, no wheezes, rhonci, crackles Cardiovascular: Regular rate and rhythm. No murmurs, gallops or rubs. Abdomen:Soft. Bowel sounds present. Non-tender.  Extremities: No  lower extremity edema. Pulses are 2 + in the bilateral DP/PT.  EKG:  EKG is not ordered today. The ekg ordered today demonstrates   Recent Labs: 11/22/2022: ALT 44; TSH 1.86 09/17/2023: BUN 16; Creatinine, Ser 0.71; Hemoglobin 12.4; Platelets 323; Potassium 4.0; Sodium 139   Lipid Panel    Component Value Date/Time   CHOL 124 11/22/2022 1558   CHOL 151 10/17/2011 0826   TRIG 250.0 (H) 11/22/2022 1558   TRIG 136 10/17/2011 0826   HDL 40.10 11/22/2022 1558   HDL 44 10/17/2011 0826   CHOLHDL 3 11/22/2022 1558   VLDL 50.0 (H) 11/22/2022 1558   VLDL 27 10/17/2011 0826   LDLCALC 56 09/28/2021 1034   LDLCALC 80 10/17/2011 0826   LDLDIRECT 62.0 11/22/2022 1558     Wt Readings from Last 3 Encounters:  09/24/23 71 kg  09/20/23 71.8 kg  09/17/23 72.6 kg    Assessment and Plan:   1. CAD with unstable angina: She is known to have mild CAD by cardiac cath in 2011. She is now having symptoms that are concerning for unstable angina. Recent ED visit one week ago with no evidence of ACS but new LBBB on EKG. Risk factors for progression of CAD include DM, advanced age and HLD. I think cardiac cath is indicated. She is followed in our [facility] office but will plan cardiac cath at [facility] tomorrow at noon with Dr. [place] (I do not have any open cath slots for the next two weeks).    I have reviewed the risks, indications, and alternatives to cardiac catheterization, possible angioplasty, and stenting with the patient. Risks include but are  not limited to bleeding, infection, vascular injury, stroke, myocardial infection, arrhythmia, kidney injury, radiation-related injury in the case of prolonged fluoroscopy use, emergency cardiac surgery, and death. The patient understands the risks of serious complication is 1-2 in 1000 with diagnostic cardiac cath and 1-2% or less with angioplasty/stenting.  BMET and CBC ok last week.  Will arrange echo to assess LV and RV function, exclude valvular disease.   2.  Hyperlipidemia: Continue statin.   Labs/ tests ordered today include:   Orders Placed This Encounter  Procedures   ECHOCARDIOGRAM COMPLETE   Disposition:   F/U with Dr. Mariah Milling or APP in 2-3 weeks in the [place] office.    Signed, Verne Carrow, MD, [facility] 09/24/2023 10:49 AM    [facility] [address] [place], [place], [place]  [address] Phone: [phone]; Fax: [phone]

## 2023-09-24 NOTE — Patient Instructions (Signed)
 Medication Instructions:  No changes *If you need a refill on your cardiac medications before your next appointment, please call your pharmacy*  Lab Work: none  Testing/Procedures: Your physician has requested that you have an echocardiogram. Echocardiography is a painless test that uses sound waves to create images of your heart. It provides your doctor with information about the size and shape of your heart and how well your heart's chambers and valves are working. This procedure takes approximately one hour. There are no restrictions for this procedure. Please do NOT wear cologne, perfume, aftershave, or lotions (deodorant is allowed). Please arrive 15 minutes prior to your appointment time.  Please note: We ask at that you not bring children with you during ultrasound (echo/ vascular) testing. Due to room size and safety concerns, children are not allowed in the ultrasound rooms during exams. Our front office staff cannot provide observation of children in our lobby area while testing is being conducted. An adult accompanying a patient to their appointment will only be allowed in the ultrasound room at the discretion of the ultrasound technician under special circumstances. We apologize for any inconvenience.   Follow-Up: At [facility], you and your health needs are our priority.  As part of our continuing mission to provide you with exceptional heart care, our providers are all part of one team.  This team includes your primary Cardiologist (physician) and Advanced Practice Providers or APPs (Physician Assistants and Nurse Practitioners) who all work together to provide you with the care you need, when you need it.  Your next appointment:   3 week(s)  Provider:   You may see Julien Nordmann, MD or one of the following Advanced Practice Providers on your designated Care Team:   Nicolasa Ducking, NP Ames Dura, PA-C Eula Listen, PA-C Cadence Fransico Michael, [place] Charlsie Quest,  NP Carlos Levering, NP          Cardiac/Peripheral Catheterization   You are scheduled for a Cardiac Catheterization on Tuesday, April 8 with Dr. Peter Swaziland.  1. Please arrive at the [facility] (Main Entrance A) at [facility]: [address] [place], [place] [address] at 10:00 AM (This time is TWO hour(s) before your procedure to ensure your preparation).   Free valet parking service is available. You will check in at ADMITTING. The support person will be asked to wait in the waiting room.  It is OK to have someone drop you off and come back when you are ready to be discharged.        Special note: Every effort is made to have your procedure done on time. Please understand that emergencies sometimes delay scheduled procedures.  2. Diet: Do not eat solid foods after midnight.  You may have clear liquids until 5 AM the day of the procedure.  3. Labs: labs are up to date  4. Medication instructions in preparation for your procedure:   Contrast Allergy: NO   Do not take Diabetes Med Glucophage (Metformin) on the day of the procedure and HOLD 48 HOURS AFTER THE PROCEDURE.  On the morning of your procedure, take Aspirin 81 mg and any morning medicines NOT listed above.  You may use sips of water.  5. Plan to go home the same day, you will only stay overnight if medically necessary. 6. You MUST have a responsible adult to drive you home. 7. An adult MUST be with you the first 24 hours after you arrive home. 8. Bring a current list of your  medications, and the last time and date medication taken. 9. Bring ID and current insurance cards. 10.Please wear clothes that are easy to get on and off and wear slip-on shoes.  Thank you for allowing [place] to care for you!   -- [facility]     1st Floor: - Lobby - Registration  - Pharmacy  - Lab - Cafe  2nd Floor: - PV Lab - Diagnostic Testing (echo, CT, nuclear med)  3rd Floor: -  Vacant  4th Floor: - TCTS (cardiothoracic surgery) - AFib Clinic - Structural Heart Clinic - Vascular Surgery  - Vascular Ultrasound  5th Floor: - HeartCare Cardiology (general and EP) - Clinical Pharmacy for coumadin, hypertension, lipid, weight-loss medications, and med management appointments    Valet parking services will be available as well.

## 2023-09-24 NOTE — H&P (View-Only) (Signed)
 Chief Complaint  Patient presents with   New Patient (Initial Visit)    Chest pain, dyspnea   History of Present Illness: 68 yo female with history of mild CAD, HLD, anxiety, depression, DM2, sleep apnea who is here today for follow up. She has been followed in our office by Dr. Mariah Milling. She has not been seen in our office since January 2022 so she is put on my DOD schedule today as a new patient. She was admitted in 2011 with chest pain and elevated troponin, Cardiac cath with mild CAD. Her presentation was felt to be secondary to a viral myocarditis. Echo in April 2019 with LVEF=65-70%. No valve disease. Nuclear stress test in May 2020 with no ischemia. She was seen in the ED on 09/17/23 with c/o chest pain. Troponin negative. EKG with sinus, new LBBB. Chest CTA with no PE or aortic dissection.   She tells me today that she has been having chest pressure and dyspnea with exertion for the past 10 days. She describes heaviness in the center of her chest, worsened with exertion. Some radiation of pain into her left arm.   Primary Care Physician: Hannah Beat, MD  [facility] Cardiologist: Dr. Mariah Milling  Past Medical History:  Diagnosis Date   CAD (coronary artery disease)    a. 01/2010 Cath: mild atherosclerosis in the mid LCX after takeoff of OM1, mild diff LAD dzs;  b. 07/2013 MV: EF 67%, no ischemia.   GAD (generalized anxiety disorder)    History of kidney stones    HLD (hyperlipidemia)    Hypothyroidism    Major depressive disorder, recurrent episode, in full remission (HCC) 09/08/2008   Qualifier: Diagnosis of  By: Patsy Lager MD, [place]     Migraine headache    Myocarditis (HCC)    a. 01/2010 presented w/ c/p and elev trop-->Cath: mild atherosclerosis in the mid LCX after takeoff of OM1, mild diff LAD dzs;  b. 03/2010 Echo: EF 60-65%, DD, mild LVH;  c. 07/2011 Echo: EF 55-60%, mild LVH, no rwma.   Sleep apnea    has c-pap.  wears off and on - not lately  using  per reported 07-29-2020   Type 2  diabetes, diet controlled [facility])     Past Surgical History:  Procedure Laterality Date   APPENDECTOMY  1974   BREAST SURGERY Right 1988   fatty tumore from breast   CARDIAC CATHETERIZATION  01/2010   [facility],   CHOLECYSTECTOMY  2002   COLONOSCOPY  2017   CYST EXCISION     ovarian cyst 1994   NEPHROLITHOTOMY Right 08/20/2014   Procedure: RIGHT PERCUTANEOUS NEPHROLITHOTOMY ;  Surgeon: Anner Crete, MD;  Location: [facility];  Service: Urology;  Laterality: Right;   POLYPECTOMY     RECTAL EXAM UNDER ANESTHESIA N/A 08/02/2020   Procedure: Lurena Joiner EXAM UNDER ANESTHESIA INJECTION OF PERIANAL BOTOX;  Surgeon: Andria Meuse, MD;  Location: [facility];  Service: General;  Laterality: N/A;   TOTAL ABDOMINAL HYSTERECTOMY W/ BILATERAL SALPINGOOPHORECTOMY  2000   no ovaries, uterus, or cervix.    Current Outpatient Medications  Medication Sig Dispense Refill   Accu-Chek Softclix Lancets lancets Use to check blood sugar daily 100 each 3   albuterol (VENTOLIN HFA) 108 (90 Base) MCG/ACT inhaler Inhale 2 puffs into the lungs every 6 (six) hours as needed for wheezing or shortness of breath. 8 g 2   aspirin EC 81 MG tablet Take 81 mg by mouth daily.  Blood Glucose Monitoring Suppl (ACCU-CHEK GUIDE) w/Device KIT Use to check blood sugar daily 1 kit 0   clobetasol ointment (TEMOVATE) 0.05 % Apply 1 Application topically 2 (two) times daily as needed.     cyclobenzaprine (FLEXERIL) 10 MG tablet TAKE 1 TABLET (10 MG TOTAL) BY MOUTH 2 (TWO) TIMES DAILY AS NEEDED (PAIN).     fenofibrate 160 MG tablet TAKE 1 TABLET BY MOUTH DAILY 90 tablet 3   gabapentin (NEURONTIN) 300 MG capsule Take 300 mg by mouth at bedtime.     glucose blood (ACCU-CHEK GUIDE) test strip Use to check blood sugar daily 100 each 3   Lactobacillus-Inulin (CULTURELLE ADULT ULT BALANCE PO)      levothyroxine (SYNTHROID) 75 MCG tablet TAKE ONE TABLET BY MOUTH EVERY MORNING BEFORE BREAKFAST  90 tablet 3   Magnesium 250 MG TABS Take 1 tablet by mouth daily.     metFORMIN (GLUCOPHAGE-XR) 500 MG 24 hr tablet TAKE FOUR TABLETS BY MOUTH EVERY MORNING WITH BREAKFAST 360 tablet 0   metoprolol succinate (TOPROL-XL) 50 MG 24 hr tablet TAKE ONE TABLET BY MOUTH DAILY WITH OR IMMEDIATELY FOLLOWING A MEAL 30 tablet 0   Multiple Vitamins-Minerals (ONE-A-DAY WOMENS PETITES PO) Take 2 tablets by mouth daily.     nitroGLYCERIN (NITROSTAT) 0.4 MG SL tablet 1 tablet under tongue at onset of chest pain; you may repeat every 5 minutes for up to 3 doses. 25 tablet 3   pantoprazole (PROTONIX) 40 MG tablet TAKE 1 TABLET BY MOUTH DAILY 90 tablet 3   pravastatin (PRAVACHOL) 40 MG tablet TAKE 1 TABLET BY MOUTH AT BEDTIME 90 tablet 1   sertraline (ZOLOFT) 50 MG tablet TAKE 1 TABLET BY MOUTH DAILY 90 tablet 1   traZODone (DESYREL) 100 MG tablet Take 1 tablet (100 mg total) by mouth at bedtime. 30 tablet 5   triamcinolone ointment (KENALOG) 0.1 % Apply topically.     No current facility-administered medications for this visit.    Allergies  Allergen Reactions   Pseudoeph-Doxylamine-Dm-Apap Anaphylaxis    Allergy to nyquil   Gluten Other (See Comments)    Gi upset   Penicillins Itching and Rash    Social History   Socioeconomic History   Marital status: Married    Spouse name: Alinda Money   Number of children: 3   Years of education: Not on file   Highest education level: Not on file  Occupational History   Occupation: retired    Comment: [employer]  Tobacco Use   Smoking status: Former    Current packs/day: 0.00    Average packs/day: 0.4 packs/day for 35.0 years (12.3 ttl pk-yrs)    Types: Cigarettes    Start date: 09/25/1973    Quit date: 09/25/2008    Years since quitting: 15.0   Smokeless tobacco: Never   Tobacco comments:    quit in 2007-2008, social smoker  Vaping Use   Vaping status: Never Used  Substance and Sexual Activity   Alcohol use: Not Currently   Drug use: No   Sexual activity:  Not Currently  Other Topics Concern   Not on file  Social History Narrative   Lives in [place] w/ husband.  No reg exercise.   Social Drivers of Corporate investment banker Strain: Not on file  Food Insecurity: No Food Insecurity (04/03/2023)   Hunger Vital Sign    Worried About Running Out of Food in the Last Year: Never true    Ran Out of Food in the Last Year:  Never true  Transportation Needs: No Transportation Needs (04/03/2023)   PRAPARE - Administrator, Civil Service (Medical): No    Lack of Transportation (Non-Medical): No  Physical Activity: Not on file  Stress: Not on file  Social Connections: Not on file  Intimate Partner Violence: Not on file    Family History  Problem Relation Age of Onset   Coronary artery disease Mother    Diabetes Mother    ALS Mother    Hyperlipidemia Father    Breast cancer Sister 23   Stomach cancer Maternal Grandfather    Colon cancer Maternal Grandfather        dx early 64's    Breast cancer Paternal Grandmother    Lung cancer Paternal Grandfather        CAD   Other Daughter        gluten   Colon cancer Son 18       3rd stage   Down syndrome Son    Esophageal cancer Neg Hx    Liver cancer Neg Hx    Pancreatic cancer Neg Hx    Rectal cancer Neg Hx     Review of Systems:  As stated in the HPI and otherwise negative.   BP 132/78   Pulse 68   Ht 5' 3.5" (1.613 m)   Wt 71 kg   SpO2 97%   BMI 27.31 kg/m   Physical Examination: General: Well developed, well nourished, NAD  HEENT: OP clear, mucus membranes moist  SKIN: warm, dry. No rashes. Neuro: No focal deficits  Musculoskeletal: Muscle strength 5/5 all ext  Psychiatric: Mood and affect normal  Neck: No JVD, no carotid bruits, no thyromegaly, no lymphadenopathy.  Lungs:Clear bilaterally, no wheezes, rhonci, crackles Cardiovascular: Regular rate and rhythm. No murmurs, gallops or rubs. Abdomen:Soft. Bowel sounds present. Non-tender.  Extremities: No  lower extremity edema. Pulses are 2 + in the bilateral DP/PT.  EKG:  EKG is not ordered today. The ekg ordered today demonstrates   Recent Labs: 11/22/2022: ALT 44; TSH 1.86 09/17/2023: BUN 16; Creatinine, Ser 0.71; Hemoglobin 12.4; Platelets 323; Potassium 4.0; Sodium 139   Lipid Panel    Component Value Date/Time   CHOL 124 11/22/2022 1558   CHOL 151 10/17/2011 0826   TRIG 250.0 (H) 11/22/2022 1558   TRIG 136 10/17/2011 0826   HDL 40.10 11/22/2022 1558   HDL 44 10/17/2011 0826   CHOLHDL 3 11/22/2022 1558   VLDL 50.0 (H) 11/22/2022 1558   VLDL 27 10/17/2011 0826   LDLCALC 56 09/28/2021 1034   LDLCALC 80 10/17/2011 0826   LDLDIRECT 62.0 11/22/2022 1558     Wt Readings from Last 3 Encounters:  09/24/23 71 kg  09/20/23 71.8 kg  09/17/23 72.6 kg    Assessment and Plan:   1. CAD with unstable angina: She is known to have mild CAD by cardiac cath in 2011. She is now having symptoms that are concerning for unstable angina. Recent ED visit one week ago with no evidence of ACS but new LBBB on EKG. Risk factors for progression of CAD include DM, advanced age and HLD. I think cardiac cath is indicated. She is followed in our [facility] office but will plan cardiac cath at [facility] tomorrow at noon with Dr. Swaziland (I do not have any open cath slots for the next two weeks).    I have reviewed the risks, indications, and alternatives to cardiac catheterization, possible angioplasty, and stenting with the patient. Risks include but are  not limited to bleeding, infection, vascular injury, stroke, myocardial infection, arrhythmia, kidney injury, radiation-related injury in the case of prolonged fluoroscopy use, emergency cardiac surgery, and death. The patient understands the risks of serious complication is 1-2 in 1000 with diagnostic cardiac cath and 1-2% or less with angioplasty/stenting.  BMET and CBC ok last week.  Will arrange echo to assess LV and RV function, exclude valvular disease.   2.  Hyperlipidemia: Continue statin.   Labs/ tests ordered today include:   Orders Placed This Encounter  Procedures   ECHOCARDIOGRAM COMPLETE   Disposition:   F/U with Dr. Mariah Milling or APP in 2-3 weeks in the [place] office.    Signed, Verne Carrow, MD, [facility] 09/24/2023 10:49 AM    [facility] [address] [place], [place], [place]  [address] Phone: [phone]; Fax: [phone]

## 2023-09-25 ENCOUNTER — Other Ambulatory Visit: Payer: Self-pay

## 2023-09-25 ENCOUNTER — Observation Stay (HOSPITAL_COMMUNITY)
Admission: RE | Admit: 2023-09-25 | Discharge: 2023-09-27 | Disposition: A | Discharge status: Home / Self Care | Attending: Cardiology | Admitting: Cardiology

## 2023-09-25 ENCOUNTER — Other Ambulatory Visit (HOSPITAL_COMMUNITY): Payer: Self-pay

## 2023-09-25 ENCOUNTER — Encounter (HOSPITAL_COMMUNITY)
Admission: RE | Disposition: A | Discharge status: Home / Self Care | Payer: Self-pay | Source: Home / Self Care | Attending: Cardiology

## 2023-09-25 DIAGNOSIS — Z7982 Long term (current) use of aspirin: Secondary | ICD-10-CM | POA: Insufficient documentation

## 2023-09-25 DIAGNOSIS — Z7984 Long term (current) use of oral hypoglycemic drugs: Secondary | ICD-10-CM | POA: Insufficient documentation

## 2023-09-25 DIAGNOSIS — Z955 Presence of coronary angioplasty implant and graft: Principal | ICD-10-CM

## 2023-09-25 DIAGNOSIS — E119 Type 2 diabetes mellitus without complications: Secondary | ICD-10-CM | POA: Diagnosis not present

## 2023-09-25 DIAGNOSIS — Z79899 Other long term (current) drug therapy: Secondary | ICD-10-CM | POA: Diagnosis not present

## 2023-09-25 DIAGNOSIS — I25119 Atherosclerotic heart disease of native coronary artery with unspecified angina pectoris: Secondary | ICD-10-CM | POA: Diagnosis present

## 2023-09-25 DIAGNOSIS — I729 Aneurysm of unspecified site: Secondary | ICD-10-CM | POA: Insufficient documentation

## 2023-09-25 DIAGNOSIS — T81718A Complication of other artery following a procedure, not elsewhere classified, initial encounter: Secondary | ICD-10-CM | POA: Insufficient documentation

## 2023-09-25 DIAGNOSIS — R0600 Dyspnea, unspecified: Secondary | ICD-10-CM | POA: Diagnosis present

## 2023-09-25 DIAGNOSIS — I25110 Atherosclerotic heart disease of native coronary artery with unstable angina pectoris: Secondary | ICD-10-CM | POA: Insufficient documentation

## 2023-09-25 DIAGNOSIS — E785 Hyperlipidemia, unspecified: Secondary | ICD-10-CM | POA: Diagnosis not present

## 2023-09-25 DIAGNOSIS — E039 Hypothyroidism, unspecified: Secondary | ICD-10-CM | POA: Diagnosis not present

## 2023-09-25 DIAGNOSIS — I721 Aneurysm of artery of upper extremity: Secondary | ICD-10-CM | POA: Diagnosis not present

## 2023-09-25 DIAGNOSIS — Z87891 Personal history of nicotine dependence: Secondary | ICD-10-CM | POA: Insufficient documentation

## 2023-09-25 DIAGNOSIS — I200 Unstable angina: Secondary | ICD-10-CM

## 2023-09-25 HISTORY — PX: CORONARY STENT INTERVENTION: CATH118234

## 2023-09-25 HISTORY — PX: LEFT HEART CATH AND CORONARY ANGIOGRAPHY: CATH118249

## 2023-09-25 LAB — CBC
HCT: 36.3 % (ref 36.0–46.0)
Hemoglobin: 11.9 g/dL — ABNORMAL LOW (ref 12.0–15.0)
MCH: 26.8 pg (ref 26.0–34.0)
MCHC: 32.8 g/dL (ref 30.0–36.0)
MCV: 81.8 fL (ref 80.0–100.0)
Platelets: 309 10*3/uL (ref 150–400)
RBC: 4.44 MIL/uL (ref 3.87–5.11)
RDW: 13.4 % (ref 11.5–15.5)
WBC: 9.1 10*3/uL (ref 4.0–10.5)
nRBC: 0 % (ref 0.0–0.2)

## 2023-09-25 LAB — CREATININE, SERUM
Creatinine, Ser: 0.68 mg/dL (ref 0.44–1.00)
GFR, Estimated: >60 mL/min (ref >60)

## 2023-09-25 LAB — POCT ACTIVATED CLOTTING TIME: Activated Clotting Time: 297 s

## 2023-09-25 LAB — GLUCOSE, CAPILLARY
Glucose-Capillary: 114 mg/dL — ABNORMAL HIGH (ref 70–99)
Glucose-Capillary: 102 mg/dL — ABNORMAL HIGH (ref 70–99)

## 2023-09-25 LAB — HIV ANTIBODY (ROUTINE TESTING W REFLEX): HIV Screen 4th Generation wRfx: NONREACTIVE

## 2023-09-25 SURGERY — LEFT HEART CATH AND CORONARY ANGIOGRAPHY
Anesthesia: LOCAL

## 2023-09-25 MED ORDER — PRASUGREL HCL 10 MG PO TABS
10.0000 mg | ORAL_TABLET | Freq: Every day | ORAL | Status: DC
  Administered 2023-09-26 – 2023-09-27 (×2): 10 mg via ORAL
  Filled 2023-09-25 (×2): qty 1

## 2023-09-25 MED ORDER — OXYCODONE HCL 5 MG PO TABS
5.0000 mg | ORAL_TABLET | ORAL | Status: DC | PRN
  Administered 2023-09-25 – 2023-09-26 (×4): 5 mg via ORAL
  Filled 2023-09-25 (×4): qty 1

## 2023-09-25 MED ORDER — ASPIRIN 81 MG PO CHEW: 81.0000 mg | CHEWABLE_TABLET | Freq: Every day | ORAL | Status: DC

## 2023-09-25 MED ORDER — PRASUGREL HCL 10 MG PO TABS
ORAL_TABLET | ORAL | Status: DC | PRN
  Administered 2023-09-25: 60 mg via ORAL

## 2023-09-25 MED ORDER — ASPIRIN 81 MG PO TBEC
81.0000 mg | DELAYED_RELEASE_TABLET | Freq: Every day | ORAL | Status: DC
  Administered 2023-09-26 – 2023-09-27 (×2): 81 mg via ORAL
  Filled 2023-09-25 (×2): qty 1

## 2023-09-25 MED ORDER — PRASUGREL HCL 10 MG PO TABS
ORAL_TABLET | ORAL | Status: AC
  Filled 2023-09-25: qty 6

## 2023-09-25 MED ORDER — SODIUM CHLORIDE 0.9% FLUSH
3.0000 mL | Freq: Two times a day (BID) | INTRAVENOUS | Status: DC
Start: 2023-09-25 — End: 2023-09-27
  Administered 2023-09-25 – 2023-09-26 (×4): 3 mL via INTRAVENOUS

## 2023-09-25 MED ORDER — FENTANYL CITRATE (PF) 100 MCG/2ML IJ SOLN
INTRAMUSCULAR | Status: DC | PRN
  Administered 2023-09-25 (×2): 25 ug via INTRAVENOUS

## 2023-09-25 MED ORDER — MIDAZOLAM HCL 2 MG/2ML IJ SOLN
INTRAMUSCULAR | Status: DC | PRN
  Administered 2023-09-25 (×2): 1 mg via INTRAVENOUS

## 2023-09-25 MED ORDER — SODIUM CHLORIDE 0.9 % WEIGHT BASED INFUSION
3.0000 mL/kg/h | INTRAVENOUS | Status: DC
Start: 2023-09-25 — End: 2023-09-25

## 2023-09-25 MED ORDER — TRAZODONE HCL 100 MG PO TABS
100.0000 mg | ORAL_TABLET | Freq: Every day | ORAL | Status: DC
  Administered 2023-09-25 – 2023-09-26 (×2): 100 mg via ORAL
  Filled 2023-09-25 (×2): qty 1

## 2023-09-25 MED ORDER — ROSUVASTATIN CALCIUM 40 MG PO TABS
40.0000 mg | ORAL_TABLET | Freq: Every day | ORAL | 3 refills | Status: AC
  Filled 2023-09-25: qty 90, 90d supply, fill #0

## 2023-09-25 MED ORDER — PRAVASTATIN SODIUM 40 MG PO TABS
40.0000 mg | ORAL_TABLET | Freq: Every day | ORAL | Status: DC
  Administered 2023-09-25: 40 mg via ORAL
  Filled 2023-09-25: qty 1

## 2023-09-25 MED ORDER — LEVOTHYROXINE SODIUM 75 MCG PO TABS
75.0000 ug | ORAL_TABLET | Freq: Every day | ORAL | Status: DC
  Administered 2023-09-26 – 2023-09-27 (×2): 75 ug via ORAL
  Filled 2023-09-25 (×2): qty 1

## 2023-09-25 MED ORDER — PANTOPRAZOLE SODIUM 40 MG PO TBEC
40.0000 mg | DELAYED_RELEASE_TABLET | Freq: Every day | ORAL | 3 refills | Status: DC
  Filled 2023-09-25: qty 90, 90d supply, fill #0

## 2023-09-25 MED ORDER — LIDOCAINE HCL (PF) 1 % IJ SOLN
INTRAMUSCULAR | Status: DC | PRN
  Administered 2023-09-25: 2 mL

## 2023-09-25 MED ORDER — HEPARIN SODIUM (PORCINE) 1000 UNIT/ML IJ SOLN
INTRAMUSCULAR | Status: DC | PRN
  Administered 2023-09-25 (×2): 3500 [IU] via INTRAVENOUS

## 2023-09-25 MED ORDER — PANTOPRAZOLE SODIUM 40 MG PO TBEC
40.0000 mg | DELAYED_RELEASE_TABLET | Freq: Every day | ORAL | Status: DC
  Administered 2023-09-25 – 2023-09-27 (×3): 40 mg via ORAL
  Filled 2023-09-25 (×3): qty 1

## 2023-09-25 MED ORDER — PRASUGREL HCL 10 MG PO TABS
ORAL_TABLET | ORAL | Status: AC
  Filled 2023-09-25: qty 1

## 2023-09-25 MED ORDER — FENTANYL CITRATE (PF) 100 MCG/2ML IJ SOLN
INTRAMUSCULAR | Status: AC
  Filled 2023-09-25: qty 2

## 2023-09-25 MED ORDER — SERTRALINE HCL 50 MG PO TABS
50.0000 mg | ORAL_TABLET | Freq: Every day | ORAL | Status: DC
  Administered 2023-09-25 – 2023-09-27 (×3): 50 mg via ORAL
  Filled 2023-09-25 (×3): qty 1

## 2023-09-25 MED ORDER — HYDRALAZINE HCL 20 MG/ML IJ SOLN: 10.0000 mg | INTRAMUSCULAR | Status: AC | PRN

## 2023-09-25 MED ORDER — HEPARIN SODIUM (PORCINE) 5000 UNIT/ML IJ SOLN
5000.0000 [IU] | Freq: Three times a day (TID) | INTRAMUSCULAR | Status: DC
  Administered 2023-09-26 – 2023-09-27 (×3): 5000 [IU] via SUBCUTANEOUS
  Filled 2023-09-25 (×4): qty 1

## 2023-09-25 MED ORDER — ACETAMINOPHEN 325 MG PO TABS
650.0000 mg | ORAL_TABLET | ORAL | Status: DC | PRN
  Administered 2023-09-25 (×2): 650 mg via ORAL
  Filled 2023-09-25 (×2): qty 2

## 2023-09-25 MED ORDER — NITROGLYCERIN 0.4 MG SL SUBL
SUBLINGUAL_TABLET | SUBLINGUAL | 3 refills | Status: AC
  Filled 2023-09-25: qty 25, 9d supply, fill #0

## 2023-09-25 MED ORDER — VERAPAMIL HCL 2.5 MG/ML IV SOLN
INTRAVENOUS | Status: AC
  Filled 2023-09-25: qty 2

## 2023-09-25 MED ORDER — VERAPAMIL HCL 2.5 MG/ML IV SOLN
INTRAVENOUS | Status: DC | PRN
  Administered 2023-09-25: 10 mL via INTRA_ARTERIAL

## 2023-09-25 MED ORDER — ONDANSETRON HCL 4 MG/2ML IJ SOLN: 4.0000 mg | Freq: Four times a day (QID) | INTRAMUSCULAR | Status: DC | PRN

## 2023-09-25 MED ORDER — IOHEXOL 350 MG/ML SOLN
INTRAVENOUS | Status: DC | PRN
  Administered 2023-09-25: 76 mL

## 2023-09-25 MED ORDER — GABAPENTIN 300 MG PO CAPS
300.0000 mg | ORAL_CAPSULE | Freq: Every day | ORAL | Status: DC
  Administered 2023-09-25 – 2023-09-26 (×2): 300 mg via ORAL
  Filled 2023-09-25 (×2): qty 1

## 2023-09-25 MED ORDER — FENOFIBRATE 160 MG PO TABS
160.0000 mg | ORAL_TABLET | Freq: Every day | ORAL | Status: DC
Start: 2023-09-25 — End: 2023-09-27
  Administered 2023-09-25 – 2023-09-27 (×3): 160 mg via ORAL
  Filled 2023-09-25 (×3): qty 1

## 2023-09-25 MED ORDER — SODIUM CHLORIDE 0.9 % IV SOLN: 250.0000 mL | INTRAVENOUS | Status: AC | PRN

## 2023-09-25 MED ORDER — ASPIRIN 81 MG PO CHEW: 81.0000 mg | CHEWABLE_TABLET | ORAL | Status: DC

## 2023-09-25 MED ORDER — LABETALOL HCL 5 MG/ML IV SOLN: 10.0000 mg | INTRAVENOUS | Status: AC | PRN

## 2023-09-25 MED ORDER — SODIUM CHLORIDE 0.9% FLUSH: 3.0000 mL | INTRAVENOUS | Status: DC | PRN

## 2023-09-25 MED ORDER — METOPROLOL SUCCINATE ER 25 MG PO TB24
25.0000 mg | ORAL_TABLET | Freq: Every day | ORAL | Status: DC
  Administered 2023-09-25 – 2023-09-27 (×3): 25 mg via ORAL
  Filled 2023-09-25 (×3): qty 1

## 2023-09-25 MED ORDER — SODIUM CHLORIDE 0.9 % WEIGHT BASED INFUSION: 1.0000 mL/kg/h | INTRAVENOUS | Status: DC

## 2023-09-25 MED ORDER — HEPARIN SODIUM (PORCINE) 1000 UNIT/ML IJ SOLN
INTRAMUSCULAR | Status: AC
  Filled 2023-09-25: qty 10

## 2023-09-25 MED ORDER — HEPARIN (PORCINE) IN NACL 1000-0.9 UT/500ML-% IV SOLN
INTRAVENOUS | Status: DC | PRN
  Administered 2023-09-25: 1000 mL

## 2023-09-25 MED ORDER — PRASUGREL HCL 10 MG PO TABS
ORAL_TABLET | ORAL | Status: AC
  Filled 2023-09-25: qty 4

## 2023-09-25 MED ORDER — METOPROLOL SUCCINATE ER 50 MG PO TB24
50.0000 mg | ORAL_TABLET | Freq: Every day | ORAL | 3 refills | Status: DC
  Filled 2023-09-25: qty 90, 90d supply, fill #0

## 2023-09-25 MED ORDER — FENOFIBRATE 160 MG PO TABS
160.0000 mg | ORAL_TABLET | Freq: Every day | ORAL | 3 refills | Status: DC
  Filled 2023-09-25: qty 90, 90d supply, fill #0

## 2023-09-25 MED ORDER — MIDAZOLAM HCL 2 MG/2ML IJ SOLN
INTRAMUSCULAR | Status: AC
  Filled 2023-09-25: qty 2

## 2023-09-25 MED ORDER — PRASUGREL HCL 10 MG PO TABS
10.0000 mg | ORAL_TABLET | Freq: Every day | ORAL | 11 refills | Status: AC
  Filled 2023-09-25: qty 30, 30d supply, fill #0

## 2023-09-25 SURGICAL SUPPLY — 16 items
BALLN EMERGE MR 2.5X12 (BALLOONS) ×1 IMPLANT
BALLOON EMERGE MR 2.5X12 (BALLOONS) IMPLANT
CATH 5FR JL3.5 JR4 ANG PIG MP (CATHETERS) IMPLANT
CATH VISTA GUIDE 6FR XBLD 3.5 (CATHETERS) IMPLANT
DEVICE RAD COMP TR BAND LRG (VASCULAR PRODUCTS) IMPLANT
GLIDESHEATH SLEND SS 6F .021 (SHEATH) IMPLANT
GUIDEWIRE INQWIRE 1.5J.035X260 (WIRE) IMPLANT
INQWIRE 1.5J .035X260CM (WIRE) ×1 IMPLANT
KIT ENCORE 26 ADVANTAGE (KITS) IMPLANT
PACK CARDIAC CATHETERIZATION (CUSTOM PROCEDURE TRAY) ×1 IMPLANT
SET ATX-X65L (MISCELLANEOUS) IMPLANT
STENT SYNERGY XD 3.50X12 (Permanent Stent) IMPLANT
SYNERGY XD 3.50X12 (Permanent Stent) ×1 IMPLANT
TUBING CIL FLEX 10 FLL-RA (TUBING) IMPLANT
WIRE ASAHI PROWATER 180CM (WIRE) IMPLANT
WIRE HI TORQ VERSACORE-J 145CM (WIRE) IMPLANT

## 2023-09-25 NOTE — Discharge Instructions (Addendum)
 Resume Metformin on 4/10   Drink plenty of fluids for 48 hours and keep wrist elevated at heart level for 24 hours  Ulnar Site Care   This sheet gives you information about how to care for yourself after your procedure. Your health care provider may also give you more specific instructions. If you have problems or questions, contact your health care provider. What can I expect after the procedure? After the procedure, it is common to have: Bruising and tenderness at the catheter insertion area. Follow these instructions at home: Medicines Take over-the-counter and prescription medicines only as told by your health care provider. Insertion site care Follow instructions from your health care provider about how to take care of your insertion site. Make sure you: Wash your hands with soap and water before you change your bandage (dressing). If soap and water are not available, use hand sanitizer. Remove your dressing as told by your health care provider. In 24 hours Check your insertion site every day for signs of infection. Check for: Redness, swelling, or pain. Fluid or blood. Pus or a bad smell. Warmth. Do not take baths, swim, or use a hot tub until your health care provider approves. You may shower 24-48 hours after the procedure, or as directed by your health care provider. Remove the dressing and gently wash the site with plain soap and water. Pat the area dry with a clean towel. Do not rub the site. That could cause bleeding. Do not apply powder or lotion to the site. Activity   For 24 hours after the procedure, or as directed by your health care provider: Do not flex or bend the affected arm. Do not push or pull heavy objects with the affected arm. Do not drive yourself home from the hospital or clinic. You may drive 24 hours after the procedure unless your health care provider tells you not to. Do not operate machinery or power tools. Do not lift anything that is heavier  than 10 lb (4.5 kg), or the limit that you are told, until your health care provider says that it is safe.  For 4 days Ask your health care provider when it is okay to: Return to work or school. Resume usual physical activities or sports. Resume sexual activity. General instructions If the catheter site starts to bleed, raise your arm and put firm pressure on the site. If the bleeding does not stop, get help right away. This is a medical emergency. If you went home on the same day as your procedure, a responsible adult should be with you for the first 24 hours after you arrive home. Keep all follow-up visits as told by your health care provider. This is important. Contact a health care provider if: You have a fever. You have redness, swelling, or yellow drainage around your insertion site. Get help right away if: You have unusual pain at the radial site. The catheter insertion area swells very fast. The insertion area is bleeding, and the bleeding does not stop when you hold steady pressure on the area. Your arm or hand becomes pale, cool, tingly, or numb. These symptoms may represent a serious problem that is an emergency. Do not wait to see if the symptoms will go away. Get medical help right away. Call your local emergency services (911 in the [place] Do not drive yourself to the hospital. Summary After the procedure, it is common to have bruising and tenderness at the site. Follow instructions from your health care provider about  how to take care of your radial site wound. Check the wound every day for signs of infection. Do not lift anything that is heavier than 10 lb (4.5 kg), or the limit that you are told, until your health care provider says that it is safe. This information is not intended to replace advice given to you by your health care provider. Make sure you discuss any questions you have with your health care provider. Document Revised: 07/11/2017 Document Reviewed:  07/11/2017 Elsevier Patient Education  2020 [employer].

## 2023-09-25 NOTE — Progress Notes (Signed)
 TR band removed at 1610, gauze dressing applied. Right radial level 0, clean, dry, and intact. Patient walked to the bathroom without difficulties.

## 2023-09-25 NOTE — Interval H&P Note (Signed)
 History and Physical Interval Note:  09/25/2023 11:36 AM  Brandi Mahoney  has presented today for surgery, with the diagnosis of unstable angina.  The various methods of treatment have been discussed with the patient and family. After consideration of risks, benefits and other options for treatment, the patient has consented to  Procedure(s): LEFT HEART CATH AND CORONARY ANGIOGRAPHY (N/A) as a surgical intervention.  The patient's history has been reviewed, patient examined, no change in status, stable for surgery.  I have reviewed the patient's chart and labs.  Questions were answered to the patient's satisfaction.   Cath Lab Visit (complete for each Cath Lab visit)  Clinical Evaluation Leading to the Procedure:   ACS: Yes.    Non-ACS:    Anginal Classification: CCS III  Anti-ischemic medical therapy: Minimal Therapy (1 class of medications)  Non-Invasive Test Results: No non-invasive testing performed  Prior CABG: No previous CABG        Theron Arista [facility] 09/25/2023 11:36 AM

## 2023-09-25 NOTE — Plan of Care (Signed)
 Cross Cover Note   Patient having pain at wrist site from cath. Per nursing, site is demarcated with pen, has not grown in size, but remains firm. Having 7/10 pain despite tylenol.  Plan: -start oxycodone 5mg  q4h prn severe pain for overnight, continue apap -coband overnight for soft compression while sleeping -continue nursing checks, if growing in size past demarcation with growing hematoma, will utilize TR band at that time. Holding for now.   Achille Rich, MD Cardiology

## 2023-09-25 NOTE — Progress Notes (Signed)
 ON-CALL CARDIOLOGY 09/25/23  Patient's name: Brandi Mahoney.   MRN: [id number].    DOB: Oct 26, 1955 Primary care provider: Hannah Beat, MD. Primary cardiologist: Dr. Julien Nordmann  Interaction regarding this patient's care today: Patient came in for elective cath for symptoms concerning for unstable angina.   Underwent pLAD PCI w/ Dr. Swaziland w/ plans to be discharged home   At the time of discharge she started having dyspnea and shoulder pain.   Seen by APP who requested that I come see and evaluate the patient.   She accompanied by her son and husband.   EKG performed and reviewed.   Temp:  [98 F (36.7 C)-98.2 F (36.8 C)] 98 F (36.7 C) (04/08 2004) Pulse Rate:  [0-75] 66 (04/08 2004) Cardiac Rhythm: Normal sinus rhythm;Bundle branch block (04/08 1921) Resp:  [13-31] 18 (04/08 2004) BP: (120-146)/(63-84) 138/63 (04/08 2004) SpO2:  [92 %-99 %] 97 % (04/08 2004) Weight:  [70.8 kg-71.2 kg] 70.8 kg (04/08 2004)  General: A&Ox3, NAD, hemodynamically stable.  HEENT: [place]/at, trachea midline, no JVP  Lungs: CTAB no w/r/r Heart: RRR, no m/r/g  Abdomen: soft, nt,nd, BS 4 quad  Extremities: no swelling. Right radial site - no hematoma, pulses in tact, no bruit.  Neuro: no focal neuro deficit, strength 3+ bilateral UE and LE Psych: normal affect.   Impression: Unstable Angina CAD s/p PIC to proximal LAD Dyspnea   Recommendations: S/p PCI to proximal LAD w/ plans for same day discharge.  Patient evaluated, VS reviewed, EKG obtained and reviewed.  Reassurance provided.  Patient and husband requesting overnight observation APP to place orders.  Telemetry floor recommended.  May call cardiology fellow on call if needed overnight.  Plan on early d/c in the morning.   Tessa Lerner, DO, [facility]  [facility]  [address] [place], [place] [address] 09/25/2023 10:48 PM

## 2023-09-25 NOTE — Progress Notes (Addendum)
    Nurse paged, reports patient had new c/o SOB and left shoulder pain.   Patient assessed at bedside. She states she started feeling increasingly SOB while sitting 15 minutes ago, and new onset left shoulder pain, pain not worse with movement, no limited ROM, no tenderness. She felt her symptoms are similar to what made her see cardiology before her stent. She states she is not anxious.   VSS. Pox 97% RA. Alert and oriented x3. Lung clear bilaterally. Cardiac S1S2, no murmur. BLE no edema. Skin warm. Right radial site with dressing, TR band removed, small bruise, no hematoma or bleeding, no neurovascular deficit.   Will cancel discharge tonight EKG repeated at bedside, no acute changes Unclear etiology of symptoms Called Dr Odis Hollingshead to assess the patient at bedside, recommend admit for  overnight observation, no Echo recommended, admission orders placed.

## 2023-09-25 NOTE — Progress Notes (Signed)
 CARDIAC REHAB PHASE I     Post stent education including site care, restrictions, risk factors, exercise guidelines, NTG use, antiplatelet therapy importance, heart healthy diet and CRP2 reviewed. All questions and concerns addressed. Will refer to [facility] for CRP2. Plan for home later today.       Woodroe Chen, RN BSN 09/25/2023 2:21 PM

## 2023-09-25 NOTE — Discharge Summary (Incomplete)
 Discharge Summary for Same Day PCI   Patient ID: Brandi Mahoney MRN: [id number]; DOB: January 15, 1956  Admit date: 09/25/2023 Discharge date: 09/25/2023  Primary Care Provider: Hannah Beat, MD  Primary Cardiologist: Julien Nordmann, MD  Primary Electrophysiologist:  None   Discharge Diagnoses    Active Problems:   Coronary artery disease involving native coronary artery of native heart with angina pectoris [facility])    Diagnostic Studies/Procedures    Cardiac Catheterization 09/25/2023: Single vessel obstructive CAD involving the proximal LAD Normal LV function Normal LVEDP Successful PCI of the proximal LAD with DES x 1   Plan: DAPT for one year. Anticipate same day DC  Diagnostic Dominance: Right  Intervention    _____________   History of Present Illness     Brandi Mahoney is a 68 y.o. female with a past medical history of mild CAD, HLD, anxiety, depression, type 2 DM, sleep apnea. Patient was previously followed by Dr. Mariah Milling, recently established care with Dr. Clifton James. She previously was admitted in 2011 with chest pain and elevated troponin. 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 Echocardiogram in 2019 showed EF 65-70%, no significant valvular disease. Nuclear stress test in 10/2018 showed no ischemia. She was seen in the eD on 09/17/23 with chest pain. hsTn negative. EKG with sinus rhythm, new LBBB. Chest CTA showed no PE or aortic dissection.   She was seen by Dr. Clifton James as a new patient on 09/24/23. Reported that she had been having chest pressure and dyspnea on exertion for the past 10 days. She reported heaviness in the center of her chest that worsened with exertion. There was some radiation of pain into her left arm. Overall, symptoms were concerning for unstable angina   Cardiac catheterization was arranged for further evaluation.  Hospital Course     The patient underwent cardiac  cath as noted above with successful PCI of the proximal LAD with DESx1. Plan for DAPT with ASA/efffient for at least 1 year. The patient was seen by cardiac rehab while in short stay. There were no observed complications post cath. Radial*** cath site was re-evaluated prior to discharge and found to be stable without any complications. Instructions/precautions regarding cath site care were given prior to discharge.  Brandi Mahoney was seen by Dr. [place] and determined stable for discharge home. Follow up with our office has been arranged. Medications are listed below. Pertinent changes include  - Start Effient 10 mg daily  - Stopped pravastatin. Start Crestor 40 mg daily    _____________  Cath/PCI Registry Performance & Quality Measures: Aspirin prescribed? - Yes ADP Receptor Inhibitor (Plavix/Clopidogrel, Brilinta/Ticagrelor or Effient/Prasugrel) prescribed (includes medically managed patients)? - Yes High Intensity Statin (Lipitor 40-80mg  or Crestor 20-40mg ) prescribed? - Yes For EF <40%, was ACEI/ARB prescribed? - No - Outpatient Echocardiogram to assess EF will be scheduled. For EF <40%, Aldosterone Antagonist (Spironolactone or Eplerenone) prescribed? - No - Outpatient Echocardiogram to assess EF will be scheduled. Cardiac Rehab Phase II ordered (Included Medically managed Patients)? - Yes  _____________   Discharge Vitals Blood pressure (!) 146/75, pulse 72, temperature 98.2 F (36.8 C), temperature source Oral, resp. rate (!) 31, height 5\' 4"  (1.626 m), weight 71.2 kg, SpO2 97%.  Filed Weights   09/25/23 1024  Weight: 71.2 kg    Last Labs & Radiologic Studies    CBC No results for input(s): "WBC", "NEUTROABS", "HGB", "HCT", "MCV", "PLT" in the last 72 hours. Basic  Metabolic Panel No results for input(s): "NA", "K", "CL", "CO2", "GLUCOSE", "BUN", "CREATININE", "CALCIUM", "MG", "PHOS" in the last 72 hours. Liver Function Tests No results for input(s): "AST", "ALT", "ALKPHOS",  "BILITOT", "PROT", "ALBUMIN" in the last 72 hours. No results for input(s): "LIPASE", "AMYLASE" in the last 72 hours. High Sensitivity Troponin:   Recent Labs  Lab 09/17/23 1232 09/17/23 1406  TROPONINIHS 5 5    BNP Invalid input(s): "POCBNP" D-Dimer No results for input(s): "DDIMER" in the last 72 hours. Hemoglobin A1C No results for input(s): "HGBA1C" in the last 72 hours. Fasting Lipid Panel No results for input(s): "CHOL", "HDL", "LDLCALC", "TRIG", "CHOLHDL", "LDLDIRECT" in the last 72 hours. Thyroid Function Tests No results for input(s): "TSH", "T4TOTAL", "T3FREE", "THYROIDAB" in the last 72 hours.  Invalid input(s): "FREET3" _____________  CARDIAC CATHETERIZATION Result Date: 09/25/2023 Single vessel obstructive CAD involving the proximal LAD Normal LV function Normal LVEDP Successful PCI of the proximal LAD with DES x 1 Plan: DAPT for one year. Anticipate same day DC   CT Angio Chest PE W and/or Wo Contrast Result Date: 09/17/2023 CLINICAL DATA:  Pulmonary embolism (PE) suspected, high prob EXAM: CT ANGIOGRAPHY CHEST WITH CONTRAST TECHNIQUE: Multidetector CT imaging of the chest was performed using the standard protocol during bolus administration of intravenous contrast. Multiplanar CT image reconstructions and MIPs were obtained to evaluate the vascular anatomy. RADIATION DOSE REDUCTION: This exam was performed according to the departmental dose-optimization program which includes automated exposure control, adjustment of the mA and/or kV according to patient size and/or use of iterative reconstruction technique. CONTRAST:  75mL OMNIPAQUE IOHEXOL 350 MG/ML SOLN COMPARISON:  CTA chest dated 06/22/2016. FINDINGS: Cardiovascular: Satisfactory opacification of the pulmonary arteries to the segmental level. No evidence of pulmonary embolism. Normal heart size. No pericardial effusion. Foci of coronary artery calcifications. Thoracic aorta is normal in caliber with mild atherosclerotic  calcification. Mediastinum/Nodes: No enlarged mediastinal, hilar, or axillary lymph nodes. Thyroid gland, trachea, and esophagus demonstrate no significant findings. Lungs/Pleura: Mild bibasilar linear atelectasis/scarring. No suspicious pulmonary nodule. No focal consolidation, pleural effusion, or pneumothorax. Upper Abdomen: Grossly stable partially calcified splenic cyst. No acute abnormality. Musculoskeletal: No chest wall abnormality. No acute or significant osseous findings. Review of the MIP images confirms the above findings. IMPRESSION: 1. No evidence of pulmonary embolism. 2. No acute intrathoracic findings. Aortic Atherosclerosis (ICD10-I70.0). Electronically Signed   By: Hart Robinsons M.D.   On: 09/17/2023 15:50   DG Chest 2 View Result Date: 09/17/2023 CLINICAL DATA:  Chest pain and shortness of breath. EXAM: CHEST - 2 VIEW COMPARISON:  MRI of the chest 10/10/2020. FINDINGS: The cardiac silhouette is within normal limits. There is prominence of the right paratracheal stripe corresponding to ectatic vasculature on MRI. Both lungs are clear. The visualized skeletal structures are unremarkable. IMPRESSION: No active cardiopulmonary disease. Electronically Signed   By: Darliss Cheney M.D.   On: 09/17/2023 15:08    Disposition   Pt is being discharged home today in good condition.  Follow-up Plans & Appointments     Discharge Instructions     Amb Referral to Cardiac Rehabilitation   Complete by: As directed    Diagnosis: Coronary Stents   After initial evaluation and assessments completed: Virtual Based Care may be provided alone or in conjunction with Phase 2 Cardiac Rehab based on patient barriers.: Yes   Intensive Cardiac Rehabilitation (ICR) [facility] location only OR Traditional Cardiac Rehabilitation (TCR) *If criteria for ICR are not met will enroll in TCR [facility] only): Yes  Discharge Medications   Allergies as of 09/25/2023       Reactions   Cat Dander Shortness Of  Breath   Pseudoeph-doxylamine-dm-apap Anaphylaxis   Allergy to nyquil   Gluten Other (See Comments)   Gi upset   Penicillins Itching, Rash        Medication List     STOP taking these medications    pravastatin 40 MG tablet Commonly known as: PRAVACHOL       TAKE these medications    Accu-Chek Guide test strip Generic drug: glucose blood Use to check blood sugar daily   Accu-Chek Softclix Lancets lancets Use to check blood sugar daily   albuterol 108 (90 Base) MCG/ACT inhaler Commonly known as: VENTOLIN HFA Inhale 2 puffs into the lungs every 6 (six) hours as needed for wheezing or shortness of breath.   aspirin EC 81 MG tablet Take 81 mg by mouth daily.   clobetasol ointment 0.05 % Commonly known as: TEMOVATE Apply 1 Application topically daily as needed (skin conditions).   CULTURELLE ADULT ULT BALANCE PO Take 1 tablet by mouth daily.   fenofibrate 160 MG tablet Take 1 tablet (160 mg total) by mouth daily.   gabapentin 300 MG capsule Commonly known as: NEURONTIN Take 300 mg by mouth at bedtime.   levothyroxine 75 MCG tablet Commonly known as: SYNTHROID TAKE ONE TABLET BY MOUTH EVERY MORNING BEFORE BREAKFAST   Magnesium 250 MG Tabs Take 250 mg by mouth daily.   metFORMIN 500 MG 24 hr tablet Commonly known as: GLUCOPHAGE-XR TAKE FOUR TABLETS BY MOUTH EVERY MORNING WITH BREAKFAST What changed: See the new instructions.   metoprolol succinate 50 MG 24 hr tablet Commonly known as: TOPROL-XL Take 1 tablet (50 mg total) by mouth daily. Take with or immediately following a meal. What changed: See the new instructions.   nitroGLYCERIN 0.4 MG SL tablet Commonly known as: NITROSTAT 1 tablet under tongue at onset of chest pain; you may repeat every 5 minutes for up to 3 doses.   ONE-A-DAY WOMENS PETITES PO Take 1 tablet by mouth daily.   pantoprazole 40 MG tablet Commonly known as: PROTONIX Take 1 tablet (40 mg total) by mouth daily.   prasugrel 10  MG Tabs tablet Commonly known as: EFFIENT Take 1 tablet (10 mg total) by mouth daily. Start taking on: September 26, 2023   rosuvastatin 40 MG tablet Commonly known as: Crestor Take 1 tablet (40 mg total) by mouth daily.   sertraline 50 MG tablet Commonly known as: ZOLOFT TAKE 1 TABLET BY MOUTH DAILY   traZODone 100 MG tablet Commonly known as: DESYREL Take 1 tablet (100 mg total) by mouth at bedtime.           Allergies Allergies  Allergen Reactions   Cat Dander Shortness Of Breath   Pseudoeph-Doxylamine-Dm-Apap Anaphylaxis    Allergy to nyquil   Gluten Other (See Comments)    Gi upset   Penicillins Itching and Rash    Outstanding Labs/Studies    Duration of Discharge Encounter   Greater than 30 minutes including physician time.  Signed, Jonita Albee, PA-C 09/25/2023, 3:40 PM

## 2023-09-26 ENCOUNTER — Observation Stay (HOSPITAL_BASED_OUTPATIENT_CLINIC_OR_DEPARTMENT_OTHER)

## 2023-09-26 ENCOUNTER — Encounter (HOSPITAL_COMMUNITY): Payer: Self-pay | Admitting: Cardiology

## 2023-09-26 DIAGNOSIS — I721 Aneurysm of artery of upper extremity: Secondary | ICD-10-CM | POA: Diagnosis not present

## 2023-09-26 DIAGNOSIS — E785 Hyperlipidemia, unspecified: Secondary | ICD-10-CM | POA: Diagnosis not present

## 2023-09-26 DIAGNOSIS — I729 Aneurysm of unspecified site: Secondary | ICD-10-CM

## 2023-09-26 DIAGNOSIS — R0600 Dyspnea, unspecified: Secondary | ICD-10-CM | POA: Diagnosis not present

## 2023-09-26 DIAGNOSIS — Z79899 Other long term (current) drug therapy: Secondary | ICD-10-CM | POA: Diagnosis not present

## 2023-09-26 DIAGNOSIS — M25531 Pain in right wrist: Secondary | ICD-10-CM | POA: Diagnosis not present

## 2023-09-26 DIAGNOSIS — E039 Hypothyroidism, unspecified: Secondary | ICD-10-CM | POA: Diagnosis not present

## 2023-09-26 DIAGNOSIS — Z955 Presence of coronary angioplasty implant and graft: Secondary | ICD-10-CM | POA: Diagnosis not present

## 2023-09-26 DIAGNOSIS — I25110 Atherosclerotic heart disease of native coronary artery with unstable angina pectoris: Secondary | ICD-10-CM | POA: Diagnosis not present

## 2023-09-26 DIAGNOSIS — I25119 Atherosclerotic heart disease of native coronary artery with unspecified angina pectoris: Secondary | ICD-10-CM

## 2023-09-26 DIAGNOSIS — Z7984 Long term (current) use of oral hypoglycemic drugs: Secondary | ICD-10-CM | POA: Diagnosis not present

## 2023-09-26 DIAGNOSIS — Z87891 Personal history of nicotine dependence: Secondary | ICD-10-CM | POA: Diagnosis not present

## 2023-09-26 DIAGNOSIS — Z7982 Long term (current) use of aspirin: Secondary | ICD-10-CM | POA: Diagnosis not present

## 2023-09-26 DIAGNOSIS — E119 Type 2 diabetes mellitus without complications: Secondary | ICD-10-CM | POA: Diagnosis not present

## 2023-09-26 LAB — CBC
HCT: 36.4 % (ref 36.0–46.0)
Hemoglobin: 11.7 g/dL — ABNORMAL LOW (ref 12.0–15.0)
MCH: 26.6 pg (ref 26.0–34.0)
MCHC: 32.1 g/dL (ref 30.0–36.0)
MCV: 82.7 fL (ref 80.0–100.0)
Platelets: 277 10*3/uL (ref 150–400)
RBC: 4.4 MIL/uL (ref 3.87–5.11)
RDW: 13.5 % (ref 11.5–15.5)
WBC: 7.7 10*3/uL (ref 4.0–10.5)
nRBC: 0 % (ref 0.0–0.2)

## 2023-09-26 LAB — BASIC METABOLIC PANEL WITH GFR
Anion gap: 10 (ref 5–15)
BUN: 13 mg/dL (ref 8–23)
CO2: 27 mmol/L (ref 22–32)
Calcium: 9.4 mg/dL (ref 8.9–10.3)
Chloride: 101 mmol/L (ref 98–111)
Creatinine, Ser: 0.79 mg/dL (ref 0.44–1.00)
GFR, Estimated: >60 mL/min
Glucose, Bld: 119 mg/dL — ABNORMAL HIGH (ref 70–99)
Potassium: 3.6 mmol/L (ref 3.5–5.1)
Sodium: 138 mmol/L (ref 135–145)

## 2023-09-26 LAB — GLUCOSE, CAPILLARY: Glucose-Capillary: 184 mg/dL — ABNORMAL HIGH (ref 70–99)

## 2023-09-26 MED ORDER — OXYCODONE HCL 5 MG PO TABS
10.0000 mg | ORAL_TABLET | ORAL | Status: DC | PRN
  Administered 2023-09-26 – 2023-09-27 (×3): 10 mg via ORAL
  Filled 2023-09-26 (×3): qty 2

## 2023-09-26 MED ORDER — HYDROMORPHONE HCL 1 MG/ML IJ SOLN: 0.5000 mg | Freq: Once | INTRAMUSCULAR | Status: DC | PRN

## 2023-09-26 MED ORDER — MORPHINE SULFATE (PF) 2 MG/ML IV SOLN
2.0000 mg | Freq: Once | INTRAVENOUS | Status: AC
  Administered 2023-09-26: 2 mg via INTRAVENOUS
  Filled 2023-09-26: qty 1

## 2023-09-26 MED ORDER — ROSUVASTATIN CALCIUM 20 MG PO TABS
40.0000 mg | ORAL_TABLET | Freq: Every day | ORAL | Status: DC
  Administered 2023-09-26 – 2023-09-27 (×2): 40 mg via ORAL
  Filled 2023-09-26 (×2): qty 2

## 2023-09-26 NOTE — Care Management Obs Status (Signed)
 MEDICARE OBSERVATION STATUS NOTIFICATION   Patient Details  Name: Brandi Mahoney MRN: [id number] Date of Birth: 03-Jun-1956   Medicare Observation Status Notification Given:  Yes    Lawerance Sabal, RN 09/26/2023, 2:47 PM

## 2023-09-26 NOTE — Plan of Care (Signed)
  Problem: Education: Goal: Understanding of CV disease, CV risk reduction, and recovery process will improve Outcome: Progressing   Problem: Cardiovascular: Goal: Ability to achieve and maintain adequate cardiovascular perfusion will improve Outcome: Progressing   Problem: Education: Goal: Knowledge of General Education information will improve Description: Including pain rating scale, medication(s)/side effects and non-pharmacologic comfort measures Outcome: Progressing   Problem: Nutrition: Goal: Adequate nutrition will be maintained Outcome: Progressing

## 2023-09-26 NOTE — Progress Notes (Signed)
 Right upper ext arterial duplex  has been completed. Refer to [facility] under chart review to view preliminary results.   09/26/2023  11:23 AM Danh Bayus, Gerarda Gunther

## 2023-09-26 NOTE — Progress Notes (Addendum)
 Patient Name: Brandi Mahoney Date of Encounter: 09/26/2023 [facility] Cardiologist: Julien Nordmann, MD   Interval Summary  .    Patient denies chest pain, left shoulder pain, shortness of breath this AM. Continues to have severe pain near her right ulnar cath site.   Vital Signs .    Vitals:   09/25/23 2004 09/25/23 2324 09/26/23 0400 09/26/23 0403  BP: 138/63 134/63 132/66 132/66  Pulse: 66 64 63 63  Resp: 18   16  Temp: 98 F (36.7 C) 98.3 F (36.8 C)  98.4 F (36.9 C)  TempSrc: Oral Oral  Oral  SpO2: 97% 97%  97%  Weight: 70.8 kg     Height: 5\' 4"  (1.626 m)       Intake/Output Summary (Last 24 hours) at 09/26/2023 0901 Last data filed at 09/26/2023 0800 Gross per 24 hour  Intake 600 ml  Output --  Net 600 ml      09/25/2023    8:04 PM 09/25/2023   10:24 AM 09/24/2023    9:58 AM  Last 3 Weights  Weight (lbs) 156 lb 157 lb 156 lb 9.6 oz  Weight (kg) 70.761 kg 71.215 kg 71.033 kg      Telemetry/ECG    NSR - Personally Reviewed  Physical Exam .   GEN: No acute distress.  Sitting comfortably in the bed, reading  Neck: No JVD Cardiac:  RRR, no murmurs, rubs, or gallops. Right ulnar cath site has a small hematoma present. Site is demarcated by pen, does not appear to have grown. Site is soft, exquisitely tender    Respiratory: Clear to auscultation bilaterally. Normal WOB on room air  GI: Soft, nontender, non-distended  MS: No edema in BLE   Assessment & Plan .     CAD  - Patient underwent left heart cath yesterday and received DES to the proximal LAD.  No significant coronary artery disease otherwise.  Initially planned to be a same-day discharge but was held overnight due to complaints of right wrist pain, shortness of breath, shoulder pain - Patient chest pain-free this a.m.  Denies recurrence of left shoulder pain.  Denies shortness of breath - Continue Effient 10 mg daily, aspirin 81 mg daily - Continue fenofibrate 160 mg daily, Crestor 40 mg  daily - Continue metoprolol supinate 25 mg daily  Right wrist pain  - Overnight and this morning, patient has complained of severe pain in her right wrist near her cath site.  On exam, patient has a small hematoma.  Site has been demarcated with a pen and has not grown. - No bruit heard over cath site. Ulnar pulses are equal bilaterally  - Suspect that this is just a hematoma, however patient's description of pain seems to be out of proportion to what would be expected with a hematoma  - Ordered ultrasound to rule out pseudoaneurysm  - Ordered warm compress   For questions or updates, please contact [facility] Please consult [email or web address] for contact info under        Signed, Jonita Albee, PA-C    Patient seen and examined. Agree with assessment and plan.  No chest pain or shortness of breath.  Patient continues have significant pain at the right ulnar catheterization site.  She is able to move her fingers.  She denies any significant paresthesias.  But when she moves her fingers she notes pain shooting up above her wrist.  Plan is for vascular ultrasound this a.m.  Cardiac rhythm  is stable with sinus rhythm in the 60s without ectopy.  If ultrasound without significant abnormality, probable discharge today with medication for pain management.   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  Residual diameter of 0.64 x 0.57 cm with neck measuring 0.18 wide and 0.13 cm long.  Dr. Sherald Hess of vascular surgery is aware who will see patient today. 1:00 pm  Dr. Chestine Spore has seen patient.  The patient had undergone additional manual pressure for 15 to 20 minutes.  Patient still has mild discomfort but is able to move her fingers.  Per Dr. Chestine Spore, high likelihood for thrombosis on its own given the significant portion of the pseudoaneurysm that is already thrombosed.  Will keep  patient overnight.  Plan to DC in a.m. with plans for follow-up Doppler and office visit with Dr. Chestine Spore scheduled for next Tuesday, October 02, 2023. Nicki Guadalajara, MD  09/26/2023  4:09 PM

## 2023-09-26 NOTE — Consult Note (Addendum)
 Hospital Consult    Reason for Consult: Right ulnar artery pseudoaneurysm Requesting Physician: Dr. Tresa Endo MRN #:  [id number]  History of Present Illness: [name] with past medical history significant for type 2 diabetes mellitus, hyperlipidemia, and CAD.  She underwent left heart catheterization with PCI yesterday via the right ulnar artery.  She was noted to have bruising and swelling in the right arm postoperatively.  Duplex demonstrates ulnar artery pseudoaneurysm.  The radial and ulnar artery are patent.  She has sensation in the hand and fingertips however states her grip strength is weak.  She is on aspirin and Effient.  Past Medical History:  Diagnosis Date   CAD (coronary artery disease)    a. 01/2010 Cath: mild atherosclerosis in the mid LCX after takeoff of OM1, mild diff LAD dzs;  b. 07/2013 MV: EF 67%, no ischemia.   GAD (generalized anxiety disorder)    History of kidney stones    HLD (hyperlipidemia)    Hypothyroidism    Major depressive disorder, recurrent episode, in full remission (HCC) 09/08/2008   Qualifier: Diagnosis of  By: Patsy Lager MD, Spencer     Migraine headache    Myocarditis (HCC)    a. 01/2010 presented w/ c/p and elev trop-->Cath: mild atherosclerosis in the mid LCX after takeoff of OM1, mild diff LAD dzs;  b. 03/2010 Echo: EF 60-65%, DD, mild LVH;  c. 07/2011 Echo: EF 55-60%, mild LVH, no rwma.   Sleep apnea    has c-pap.  wears off and on - not lately  using  per reported 07-29-2020   Type 2 diabetes, diet controlled [facility])     Past Surgical History:  Procedure Laterality Date   APPENDECTOMY  1974   BREAST SURGERY Right 1988   fatty tumore from breast   CARDIAC CATHETERIZATION  01/2010   [facility] Dr Mariah Milling,   CHOLECYSTECTOMY  2002   COLONOSCOPY  2017   CORONARY STENT INTERVENTION N/A 09/25/2023   Procedure: CORONARY STENT INTERVENTION;  Surgeon: Swaziland, Peter M, MD;  Location: [facility] INVASIVE CV LAB;  Service: Cardiovascular;   Laterality: N/A;   CYST EXCISION     ovarian cyst 1994   LEFT HEART CATH AND CORONARY ANGIOGRAPHY N/A 09/25/2023   Procedure: LEFT HEART CATH AND CORONARY ANGIOGRAPHY;  Surgeon: Swaziland, Peter M, MD;  Location: [facility] INVASIVE CV LAB;  Service: Cardiovascular;  Laterality: N/A;   NEPHROLITHOTOMY Right 08/20/2014   Procedure: RIGHT PERCUTANEOUS NEPHROLITHOTOMY ;  Surgeon: Anner Crete, MD;  Location: [facility] ORS;  Service: Urology;  Laterality: Right;   POLYPECTOMY     RECTAL EXAM UNDER ANESTHESIA N/A 08/02/2020   Procedure: Lurena Joiner EXAM UNDER ANESTHESIA INJECTION OF PERIANAL BOTOX;  Surgeon: Andria Meuse, MD;  Location:  SURGERY CENTER;  Service: General;  Laterality: N/A;   TOTAL ABDOMINAL HYSTERECTOMY W/ BILATERAL SALPINGOOPHORECTOMY  2000   no ovaries, uterus, or cervix.    Allergies  Allergen Reactions   Cat Dander Shortness Of Breath   Pseudoeph-Doxylamine-Dm-Apap Anaphylaxis    Allergy to nyquil   Gluten Other (See Comments)    Gi upset   Penicillins Itching and Rash    Prior to Admission medications   Medication Sig Start Date End Date Taking? Authorizing Provider  aspirin EC 81 MG tablet Take 81 mg by mouth daily.   Yes [provider]  clobetasol ointment (TEMOVATE) 0.05 % Apply 1 Application topically daily as needed (skin conditions). 05/31/22  Yes [provider]  gabapentin (NEURONTIN)  300 MG capsule Take 300 mg by mouth at bedtime.   Yes [provider]  Lactobacillus-Inulin (CULTURELLE ADULT ULT BALANCE PO) Take 1 tablet by mouth daily. 06/25/17  Yes [provider]  levothyroxine (SYNTHROID) 75 MCG tablet TAKE ONE TABLET BY MOUTH EVERY MORNING BEFORE BREAKFAST 02/23/23  Yes Copland, Karleen Hampshire, MD  Magnesium 250 MG TABS Take 250 mg by mouth daily. 06/25/13  Yes [provider]  metFORMIN (GLUCOPHAGE-XR) 500 MG 24 hr tablet TAKE FOUR TABLETS BY MOUTH EVERY MORNING WITH BREAKFAST Patient taking differently: Take 1,000 mg by  mouth 2 (two) times daily with a meal. 09/11/23  Yes Copland, Spencer, MD  Multiple Vitamins-Minerals (ONE-A-DAY WOMENS PETITES PO) Take 1 tablet by mouth daily.   Yes [provider]  pravastatin (PRAVACHOL) 40 MG tablet TAKE 1 TABLET BY MOUTH AT BEDTIME 07/16/23  Yes Copland, Karleen Hampshire, MD  rosuvastatin (CRESTOR) 40 MG tablet Take 1 tablet (40 mg total) by mouth daily. 09/25/23  Yes Jonita Albee, PA-C  sertraline (ZOLOFT) 50 MG tablet TAKE 1 TABLET BY MOUTH DAILY 05/22/23  Yes Copland, Karleen Hampshire, MD  traZODone (DESYREL) 100 MG tablet Take 1 tablet (100 mg total) by mouth at bedtime. 03/19/23  Yes Copland, Karleen Hampshire, MD  Accu-Chek Softclix Lancets lancets Use to check blood sugar daily 08/23/22   Copland, Karleen Hampshire, MD  albuterol (VENTOLIN HFA) 108 (90 Base) MCG/ACT inhaler Inhale 2 puffs into the lungs every 6 (six) hours as needed for wheezing or shortness of breath. Patient not taking: Reported on 09/25/2023 09/17/23   Willy Eddy, MD  fenofibrate 160 MG tablet Take 1 tablet (160 mg total) by mouth daily. 09/25/23   Jonita Albee, PA-C  glucose blood (ACCU-CHEK GUIDE) test strip Use to check blood sugar daily 08/23/22   Copland, Karleen Hampshire, MD  metoprolol succinate (TOPROL-XL) 50 MG 24 hr tablet Take 1 tablet (50 mg total) by mouth daily. Take with or immediately following a meal. 09/25/23   Jonita Albee, PA-C  nitroGLYCERIN (NITROSTAT) 0.4 MG SL tablet Place 1 tablet under tongue at onset of chest pain; you may repeat every 5 minutes for up to 3 doses. 09/25/23   Jonita Albee, PA-C  pantoprazole (PROTONIX) 40 MG tablet Take 1 tablet (40 mg total) by mouth daily. 09/25/23   Jonita Albee, PA-C  prasugrel (EFFIENT) 10 MG TABS tablet Take 1 tablet (10 mg total) by mouth daily. 09/26/23   Jonita Albee, PA-C    Social History   Socioeconomic History   Marital status: Married    Spouse name: Alinda Money   Number of children: 3   Years of education: Not on file   Highest education  level: Not on file  Occupational History   Occupation: retired    Comment: [employer]  Tobacco Use   Smoking status: Former    Current packs/day: 0.00    Average packs/day: 0.4 packs/day for 35.0 years (12.3 ttl pk-yrs)    Types: Cigarettes    Start date: 09/25/1973    Quit date: 09/25/2008    Years since quitting: 15.0   Smokeless tobacco: Never   Tobacco comments:    quit in 2007-2008, social smoker  Vaping Use   Vaping status: Never Used  Substance and Sexual Activity   Alcohol use: Not Currently   Drug use: No   Sexual activity: Not Currently  Other Topics Concern   Not on file  Social History Narrative   Lives in [place] w/ husband.  No reg exercise.  Social Drivers of Corporate investment banker Strain: Not on file  Food Insecurity: No Food Insecurity (04/03/2023)   Hunger Vital Sign    Worried About Running Out of Food in the Last Year: Never true    Ran Out of Food in the Last Year: Never true  Transportation Needs: No Transportation Needs (04/03/2023)   PRAPARE - Administrator, Civil Service (Medical): No    Lack of Transportation (Non-Medical): No  Physical Activity: Not on file  Stress: Not on file  Social Connections: Not on file  Intimate Partner Violence: Not on file     Family History  Problem Relation Age of Onset   Coronary artery disease Mother    Diabetes Mother    ALS Mother    Hyperlipidemia Father    Breast cancer Sister 58   Stomach cancer Maternal Grandfather    Colon cancer Maternal Grandfather        dx early 80's    Breast cancer Paternal Grandmother    Lung cancer Paternal Grandfather        CAD   Other Daughter        gluten   Colon cancer Son 65       3rd stage   Down syndrome Son    Esophageal cancer Neg Hx    Liver cancer Neg Hx    Pancreatic cancer Neg Hx    Rectal cancer Neg Hx     ROS: Otherwise negative unless mentioned in HPI  Physical Examination  Vitals:   09/26/23 1012 09/26/23 1014  BP:  (!)  117/58  Pulse:  67  Resp:    Temp: 98.3 F (36.8 C)   SpO2:     Body mass index is 26.78 kg/m.  General:  WDWN in NAD Gait: Not observed HENT: WNL, normocephalic Pulmonary: normal non-labored breathing, without Rales, rhonchi,  wheezing Cardiac: regular Abdomen:  soft, NT/ND, no masses Skin: without rashes Vascular Exam/Pulses: Palpable right radial pulse; palmar arch Doppler signal Extremities: Fullness in the forearm but not significantly tight; bruising over the cath site and into the forearm Musculoskeletal: no muscle wasting or atrophy  Neurologic: A&O X 3;  No focal weakness or paresthesias are detected; speech is fluent/normal Psychiatric:  The pt has Normal affect. Lymph:  Unremarkable  CBC    Component Value Date/Time   WBC 7.7 09/26/2023 0430   RBC 4.40 09/26/2023 0430   HGB 11.7 (L) 09/26/2023 0430   HGB 13.3 08/05/2013 1739   HCT 36.4 09/26/2023 0430   HCT 39.3 08/05/2013 1739   PLT 277 09/26/2023 0430   PLT 267 08/05/2013 1739   MCV 82.7 09/26/2023 0430   MCV 82 08/05/2013 1739   MCH 26.6 09/26/2023 0430   MCHC 32.1 09/26/2023 0430   RDW 13.5 09/26/2023 0430   RDW 13.7 08/05/2013 1739   LYMPHSABS 2.1 11/22/2022 1558   MONOABS 0.6 11/22/2022 1558   EOSABS 0.1 11/22/2022 1558   BASOSABS 0.0 11/22/2022 1558    BMET    Component Value Date/Time   NA 138 09/26/2023 0430   NA 139 08/05/2013 1739   K 3.6 09/26/2023 0430   K 3.7 08/05/2013 1739   CL 101 09/26/2023 0430   CL 108 (H) 08/05/2013 1739   CO2 27 09/26/2023 0430   CO2 24 08/05/2013 1739   GLUCOSE 119 (H) 09/26/2023 0430   GLUCOSE 108 (H) 08/05/2013 1739   BUN 13 09/26/2023 0430   BUN 15 08/05/2013 1739   CREATININE  0.79 09/26/2023 0430   CREATININE 0.63 08/05/2013 1739   CALCIUM 9.4 09/26/2023 0430   CALCIUM 8.6 08/05/2013 1739   GFRNONAA >60 09/26/2023 0430   GFRNONAA >60 08/05/2013 1739   GFRAA >60 11/01/2018 2358   GFRAA >60 08/05/2013 1739    COAGS: Lab Results  Component  Value Date   INR 1.01 08/19/2014   INR 1.05 03/18/2010     Non-Invasive Vascular Imaging:   3 x 1 cm right ulnar pseudoaneurysm with 5 x 5 mm remaining pseudoaneurysm cavity    ASSESSMENT/PLAN: [name] with right ulnar artery pseudoaneurysm status post cardiac catheterization  Ms. Guinta is a 67 year old female who underwent cardiac catheterization yesterday via the right ulnar artery.  Postoperatively she had pain and bruising at the cath site.  Duplex was performed which demonstrated 3 x 1 cm pseudoaneurysm.  This was partially thrombosed with only 5 x 5 mm remaining cavity.  She has fullness in the forearm however has a palpable radial and dopplerable palmar arch signal with 4 out of 5 grip strength.  No absolute indication for surgery to repair pseudoaneurysm at this time.  It is likely that the pseudoaneurysm will thrombose on its own given that it is already partially thrombosed.  Manual pressure was held for 15 to 20 minutes.  We can check a repeat duplex in the morning.  If she is discharged this evening patient can follow-up with cardiology for repeat duplex in about 1 to 2 weeks.  On-call vascular surgeon Dr. Chestine Spore will evaluate the patient later today and provide further treatment plans.   Emilie Rutter PA-C [facility] [phone]  I have seen and evaluated the patient. I agree with the PA note as documented above.  Vascular surgery consulted for right ulnar artery pseudoaneurysm after ulnar access for cardiac cath yesterday.  States her pain is currently 6 out of 10.  She is elevating her arm.  Palpable radial and ulnar pulse at the wrist.  I have reviewed the duplex imaging that shows maximal diameter of the pseudoaneurysm at 3 cm and most of this is thrombosed with a residual flow lumen of 0.5 cm.  My PA also held about 20 minutes of pressure at bedside.  I suspect this will likely thrombose on its own given significant portion of this is  already thrombosed.  She feels she can tolerate her symptoms and I will follow-up with her next week in the office with duplex.  I discussed concerning signs and symptoms for her to return to the hospital.  No signs of compartment syndrome.  Ultimately discussed if this does not thrombose would require surgical intervention.  These are not amendable to thrombin injection.  Cephus Shelling, MD [facility] [place] Office: [phone]

## 2023-09-26 NOTE — Progress Notes (Signed)
 CARDIAC REHAB PHASE I     Pt resting in bed, feeling well today. Right wrist pain continues, encouraged gentle range of motion and elevation. PA ordered warm compresses for pain relief. Post Stent education completed 4-8, referral for CRP2 sent to [facility].  0900-0930 Woodroe Chen, RN BSN 09/26/2023 9:20 AM

## 2023-09-27 ENCOUNTER — Observation Stay (HOSPITAL_COMMUNITY): Admitting: Anesthesiology

## 2023-09-27 ENCOUNTER — Observation Stay (HOSPITAL_BASED_OUTPATIENT_CLINIC_OR_DEPARTMENT_OTHER): Admitting: Anesthesiology

## 2023-09-27 ENCOUNTER — Encounter (HOSPITAL_COMMUNITY)
Admission: RE | Disposition: A | Discharge status: Home / Self Care | Payer: Self-pay | Source: Home / Self Care | Attending: Cardiology

## 2023-09-27 ENCOUNTER — Other Ambulatory Visit: Payer: Self-pay

## 2023-09-27 ENCOUNTER — Encounter (HOSPITAL_COMMUNITY): Payer: Self-pay | Admitting: Cardiology

## 2023-09-27 DIAGNOSIS — Z79899 Other long term (current) drug therapy: Secondary | ICD-10-CM | POA: Diagnosis not present

## 2023-09-27 DIAGNOSIS — I25110 Atherosclerotic heart disease of native coronary artery with unstable angina pectoris: Secondary | ICD-10-CM | POA: Diagnosis not present

## 2023-09-27 DIAGNOSIS — Z87891 Personal history of nicotine dependence: Secondary | ICD-10-CM

## 2023-09-27 DIAGNOSIS — E785 Hyperlipidemia, unspecified: Secondary | ICD-10-CM | POA: Diagnosis not present

## 2023-09-27 DIAGNOSIS — J45909 Unspecified asthma, uncomplicated: Secondary | ICD-10-CM | POA: Diagnosis not present

## 2023-09-27 DIAGNOSIS — E039 Hypothyroidism, unspecified: Secondary | ICD-10-CM | POA: Diagnosis not present

## 2023-09-27 DIAGNOSIS — I721 Aneurysm of artery of upper extremity: Secondary | ICD-10-CM

## 2023-09-27 DIAGNOSIS — I10 Essential (primary) hypertension: Secondary | ICD-10-CM

## 2023-09-27 DIAGNOSIS — I729 Aneurysm of unspecified site: Secondary | ICD-10-CM | POA: Insufficient documentation

## 2023-09-27 DIAGNOSIS — Z955 Presence of coronary angioplasty implant and graft: Secondary | ICD-10-CM | POA: Diagnosis not present

## 2023-09-27 DIAGNOSIS — I25119 Atherosclerotic heart disease of native coronary artery with unspecified angina pectoris: Secondary | ICD-10-CM | POA: Diagnosis not present

## 2023-09-27 DIAGNOSIS — R0600 Dyspnea, unspecified: Secondary | ICD-10-CM | POA: Diagnosis not present

## 2023-09-27 DIAGNOSIS — I2510 Atherosclerotic heart disease of native coronary artery without angina pectoris: Secondary | ICD-10-CM | POA: Diagnosis not present

## 2023-09-27 DIAGNOSIS — Z7984 Long term (current) use of oral hypoglycemic drugs: Secondary | ICD-10-CM | POA: Diagnosis not present

## 2023-09-27 DIAGNOSIS — E119 Type 2 diabetes mellitus without complications: Secondary | ICD-10-CM | POA: Diagnosis not present

## 2023-09-27 DIAGNOSIS — Z7982 Long term (current) use of aspirin: Secondary | ICD-10-CM | POA: Diagnosis not present

## 2023-09-27 HISTORY — PX: INSERTION OF ARTERIOVENOUS (AV) ARTEGRAFT ARM: SHX6779

## 2023-09-27 LAB — GLUCOSE, CAPILLARY
Glucose-Capillary: 127 mg/dL — ABNORMAL HIGH (ref 70–99)
Glucose-Capillary: 123 mg/dL — ABNORMAL HIGH (ref 70–99)
Glucose-Capillary: 139 mg/dL — ABNORMAL HIGH (ref 70–99)

## 2023-09-27 LAB — LIPOPROTEIN A (LPA): Lipoprotein (a): 20.6 nmol/L (ref <75.0)

## 2023-09-27 SURGERY — INSERTION, GRAFT, ARTERIOVENOUS, UPPER EXTREMITY
Anesthesia: General | Laterality: Right

## 2023-09-27 MED ORDER — FENTANYL CITRATE (PF) 250 MCG/5ML IJ SOLN
INTRAMUSCULAR | Status: DC | PRN
  Administered 2023-09-27 (×2): 50 ug via INTRAVENOUS

## 2023-09-27 MED ORDER — CEFAZOLIN SODIUM-DEXTROSE 2-3 GM-%(50ML) IV SOLR
INTRAVENOUS | Status: DC | PRN
  Administered 2023-09-27: 2 g via INTRAVENOUS

## 2023-09-27 MED ORDER — ACETAMINOPHEN 500 MG PO TABS
ORAL_TABLET | ORAL | Status: AC
Start: 2023-09-27 — End: 2023-09-27
  Administered 2023-09-27: 1000 mg via ORAL
  Filled 2023-09-27: qty 2

## 2023-09-27 MED ORDER — CEFAZOLIN SODIUM-DEXTROSE 2-4 GM/100ML-% IV SOLN
INTRAVENOUS | Status: AC
  Filled 2023-09-27: qty 100

## 2023-09-27 MED ORDER — CHLORHEXIDINE GLUCONATE 0.12 % MT SOLN: 15.0000 mL | Freq: Once | OROMUCOSAL | Status: AC

## 2023-09-27 MED ORDER — HEMOSTATIC AGENTS (NO CHARGE) OPTIME
TOPICAL | Status: DC | PRN
  Administered 2023-09-27: 1 via TOPICAL

## 2023-09-27 MED ORDER — HEPARIN 6000 UNIT IRRIGATION SOLUTION
Status: DC | PRN
  Administered 2023-09-27: 1

## 2023-09-27 MED ORDER — MIDAZOLAM HCL 2 MG/2ML IJ SOLN
INTRAMUSCULAR | Status: AC
  Filled 2023-09-27: qty 2

## 2023-09-27 MED ORDER — ONDANSETRON HCL 4 MG/2ML IJ SOLN
INTRAMUSCULAR | Status: DC | PRN
  Administered 2023-09-27: 4 mg via INTRAVENOUS

## 2023-09-27 MED ORDER — CHLORHEXIDINE GLUCONATE 0.12 % MT SOLN
OROMUCOSAL | Status: AC
  Administered 2023-09-27: 15 mL via OROMUCOSAL
  Filled 2023-09-27: qty 15

## 2023-09-27 MED ORDER — ORAL CARE MOUTH RINSE: 15.0000 mL | Freq: Once | OROMUCOSAL | Status: AC

## 2023-09-27 MED ORDER — 0.9 % SODIUM CHLORIDE (POUR BTL) OPTIME
TOPICAL | Status: DC | PRN
  Administered 2023-09-27: 1000 mL

## 2023-09-27 MED ORDER — HEPARIN 6000 UNIT IRRIGATION SOLUTION
Status: AC
  Filled 2023-09-27: qty 500

## 2023-09-27 MED ORDER — PHENYLEPHRINE 80 MCG/ML (10ML) SYRINGE FOR IV PUSH (FOR BLOOD PRESSURE SUPPORT)
PREFILLED_SYRINGE | INTRAVENOUS | Status: DC | PRN
Start: 2023-09-27 — End: 2023-09-27
  Administered 2023-09-27: 40 ug via INTRAVENOUS
  Administered 2023-09-27: 80 ug via INTRAVENOUS
  Administered 2023-09-27 (×2): 40 ug via INTRAVENOUS

## 2023-09-27 MED ORDER — PROPOFOL 10 MG/ML IV BOLUS
INTRAVENOUS | Status: AC
  Filled 2023-09-27: qty 20

## 2023-09-27 MED ORDER — PROPOFOL 1000 MG/100ML IV EMUL
INTRAVENOUS | Status: AC
  Filled 2023-09-27: qty 100

## 2023-09-27 MED ORDER — ACETAMINOPHEN 500 MG PO TABS: 1000.0000 mg | ORAL_TABLET | Freq: Once | ORAL | Status: AC

## 2023-09-27 MED ORDER — PROPOFOL 10 MG/ML IV BOLUS
INTRAVENOUS | Status: DC | PRN
  Administered 2023-09-27: 120 mg via INTRAVENOUS

## 2023-09-27 MED ORDER — ALBUMIN HUMAN 5 % IV SOLN: INTRAVENOUS | Status: DC | PRN

## 2023-09-27 MED ORDER — FENTANYL CITRATE (PF) 100 MCG/2ML IJ SOLN
INTRAMUSCULAR | Status: AC
  Filled 2023-09-27: qty 2

## 2023-09-27 MED ORDER — LACTATED RINGERS IV SOLN: INTRAVENOUS | Status: DC

## 2023-09-27 MED ORDER — DEXAMETHASONE SODIUM PHOSPHATE 10 MG/ML IJ SOLN
INTRAMUSCULAR | Status: DC | PRN
Start: 2023-09-27 — End: 2023-09-27
  Administered 2023-09-27: 5 mg via INTRAVENOUS

## 2023-09-27 MED ORDER — LIDOCAINE 2% (20 MG/ML) 5 ML SYRINGE
INTRAMUSCULAR | Status: DC | PRN
  Administered 2023-09-27: 60 mg via INTRAVENOUS

## 2023-09-27 MED ORDER — FENTANYL CITRATE (PF) 250 MCG/5ML IJ SOLN
INTRAMUSCULAR | Status: AC
  Filled 2023-09-27: qty 5

## 2023-09-27 MED ORDER — MIDAZOLAM HCL 2 MG/2ML IJ SOLN
INTRAMUSCULAR | Status: DC | PRN
  Administered 2023-09-27: 1 mg via INTRAVENOUS

## 2023-09-27 SURGICAL SUPPLY — 29 items
ARMBAND PINK RESTRICT EXTREMIT (MISCELLANEOUS) ×1 IMPLANT
BAG COUNTER SPONGE SURGICOUNT (BAG) ×1 IMPLANT
CANISTER SUCT 3000ML PPV (MISCELLANEOUS) ×1 IMPLANT
CLIP TI MEDIUM 6 (CLIP) ×1 IMPLANT
CLIP TI WIDE RED SMALL 6 (CLIP) ×1 IMPLANT
COVER PROBE W GEL 5X96 (DRAPES) IMPLANT
DERMABOND ADVANCED .7 DNX12 (GAUZE/BANDAGES/DRESSINGS) ×1 IMPLANT
DERMABOND ADVANCED .7 DNX6 (GAUZE/BANDAGES/DRESSINGS) IMPLANT
ELECT REM PT RETURN 9FT ADLT (ELECTROSURGICAL) ×1 IMPLANT
ELECTRODE REM PT RTRN 9FT ADLT (ELECTROSURGICAL) ×1 IMPLANT
GLOVE BIOGEL PI IND STRL 7.0 (GLOVE) ×1 IMPLANT
GOWN STRL REUS W/ TWL LRG LVL3 (GOWN DISPOSABLE) ×2 IMPLANT
GOWN STRL REUS W/ TWL XL LVL3 (GOWN DISPOSABLE) ×1 IMPLANT
HEMOSTAT SNOW SURGICEL 2X4 (HEMOSTASIS) IMPLANT
INSERT FOGARTY SM (MISCELLANEOUS) IMPLANT
KIT BASIN OR (CUSTOM PROCEDURE TRAY) ×1 IMPLANT
KIT TURNOVER KIT B (KITS) ×1 IMPLANT
NS IRRIG 1000ML POUR BTL (IV SOLUTION) ×1 IMPLANT
PACK CV ACCESS (CUSTOM PROCEDURE TRAY) ×1 IMPLANT
PAD ARMBOARD POSITIONER FOAM (MISCELLANEOUS) ×2 IMPLANT
POWDER SURGICEL 3.0 GRAM (HEMOSTASIS) IMPLANT
SLING ARM FOAM STRAP LRG (SOFTGOODS) IMPLANT
SUT MNCRL AB 4-0 PS2 18 (SUTURE) ×1 IMPLANT
SUT PROLENE 6 0 BV (SUTURE) ×1 IMPLANT
SUT VIC AB 3-0 SH 27X BRD (SUTURE) ×1 IMPLANT
TOWEL GREEN STERILE (TOWEL DISPOSABLE) ×1 IMPLANT
UNDERPAD 30X36 HEAVY ABSORB (UNDERPADS AND DIAPERS) ×1 IMPLANT
VASCULAR TIE MINI RED 18IN STL (MISCELLANEOUS) IMPLANT
WATER STERILE IRR 1000ML POUR (IV SOLUTION) ×1 IMPLANT

## 2023-09-27 NOTE — Anesthesia Procedure Notes (Signed)
 Procedure Name: LMA Insertion Date/Time: 09/27/2023 9:53 AM  Performed by: Marena Chancy, CRNAPre-anesthesia Checklist: Patient identified, Emergency Drugs available, Suction available and Patient being monitored Patient Re-evaluated:Patient Re-evaluated prior to induction Oxygen Delivery Method: Circle System Utilized Preoxygenation: Pre-oxygenation with 100% oxygen Induction Type: IV induction Ventilation: Mask ventilation without difficulty LMA: LMA inserted LMA Size: 4.0 Number of attempts: 1 Airway Equipment and Method: Bite block Placement Confirmation: positive ETCO2 Tube secured with: Tape Dental Injury: Teeth and Oropharynx as per pre-operative assessment

## 2023-09-27 NOTE — Progress Notes (Addendum)
 Progress Note    09/27/2023 8:16 AM 2 Days Post-Op  Subjective: Says her fingers on the right are more tingly today.  Endorses more pain in the right hand today, especially with movement    Vitals:   09/26/23 1935 09/27/23 0307  BP: 131/63 (!) 155/67  Pulse: 76 78  Resp: 18 16  Temp: 98.3 F (36.8 C) 97.6 F (36.4 C)  SpO2: 96% 97%    Physical Exam: General:  resting comfortably  Cardiac:  regular Lungs:  nonlabored Extremities: Unchanged right forearm hematoma with soft compartments.  Palpable right radial pulse. Dopplerable right palmar arch and ulnar artery. Small pulsatile mass near right ulnar cath site  CBC    Component Value Date/Time   WBC 7.7 09/26/2023 0430   RBC 4.40 09/26/2023 0430   HGB 11.7 (L) 09/26/2023 0430   HGB 13.3 08/05/2013 1739   HCT 36.4 09/26/2023 0430   HCT 39.3 08/05/2013 1739   PLT 277 09/26/2023 0430   PLT 267 08/05/2013 1739   MCV 82.7 09/26/2023 0430   MCV 82 08/05/2013 1739   MCH 26.6 09/26/2023 0430   MCHC 32.1 09/26/2023 0430   RDW 13.5 09/26/2023 0430   RDW 13.7 08/05/2013 1739   LYMPHSABS 2.1 11/22/2022 1558   MONOABS 0.6 11/22/2022 1558   EOSABS 0.1 11/22/2022 1558   BASOSABS 0.0 11/22/2022 1558    BMET    Component Value Date/Time   NA 138 09/26/2023 0430   NA 139 08/05/2013 1739   K 3.6 09/26/2023 0430   K 3.7 08/05/2013 1739   CL 101 09/26/2023 0430   CL 108 (H) 08/05/2013 1739   CO2 27 09/26/2023 0430   CO2 24 08/05/2013 1739   GLUCOSE 119 (H) 09/26/2023 0430   GLUCOSE 108 (H) 08/05/2013 1739   BUN 13 09/26/2023 0430   BUN 15 08/05/2013 1739   CREATININE 0.79 09/26/2023 0430   CREATININE 0.63 08/05/2013 1739   CALCIUM 9.4 09/26/2023 0430   CALCIUM 8.6 08/05/2013 1739   GFRNONAA >60 09/26/2023 0430   GFRNONAA >60 08/05/2013 1739   GFRAA >60 11/01/2018 2358   GFRAA >60 08/05/2013 1739    INR    Component Value Date/Time   INR 1.01 08/19/2014 1120     Intake/Output Summary (Last 24 hours) at  09/27/2023 0816 Last data filed at 09/26/2023 2000 Gross per 24 hour  Intake 840 ml  Output --  Net 840 ml      Assessment/Plan:  68 y.o. female is 2 days post op, s/p: cardiac catheterization  -The patient had cardiac catheterization 2 days ago via right ulnar artery -She developed a symptomatic right ulnar artery pseudoaneurysm after her catheterization.  Yesterday we were consulted for management of this.  She did have minimal pain in the hand yesterday with intact sensation.  She had slightly reduced grip strength 4 out of 5.  Her pseudoaneurysm was not amenable to thrombin injection and she underwent manual compression for 20 minutes to encourage thrombosis -This morning the patient is having more pain in the right hand, even at rest.  Her pain is exacerbated by movement of the hand.  She also endorses tingling in her fingertips on the right.  She does have intact sensation of the hand.  She continues to have grip strength 4 out of 5.  -She still has a palpable right radial pulse and intact palmar arch Doppler signal.  She continues to have a very small pulsatile mass at the ulnar cath site.  -She has  no signs of compartment syndrome on exam.  I have offered the patient surgical repair of her pseudoaneurysm today due to her increase in symptoms.  She has been n.p.o. past midnight.  -She is agreeable to surgical repair of her right ulnar artery pseudoaneurysm today in the OR.  This will be done by Dr. Hetty Blend.   Brandi Dubonnet, PA-C [facility] [phone] 09/27/2023 8:16 AM   VASCULAR STAFF ADDENDUM: I have independently interviewed and examined the patient. I agree with the above.  No changes to medication history or physical exam since last seen in clinic. After discussing the risks and benefits of R ulnar pseudo repair, Brandi Mahoney elected to proceed.   Daria Pastures MD   Daria Pastures MD [facility] Phone  Number: [phone] 09/27/2023 8:30 AM

## 2023-09-27 NOTE — Progress Notes (Signed)
 Spoke with [employer]. Regualr vs after pseudoaneurysm repair. Paged cardiology roberts as pt's strip on tele showed mild st elevation. Pt is asymptomatic & vs stable. Will await new orders. EKG done & complete per Geoffry Paradise cards pt to be d/c'd. Sanda Linger, RN

## 2023-09-27 NOTE — Anesthesia Preprocedure Evaluation (Addendum)
 Anesthesia Evaluation  Patient identified by MRN, date of birth, ID band Patient awake    Reviewed: Allergy & Precautions, H&P , NPO status , Patient's Chart, lab work & pertinent test results  Airway Mallampati: II   Neck ROM: full    Dental   Pulmonary asthma , sleep apnea (inconsistent w/ cpap) , former smoker Quit smoking 2010, 13 pack year history    breath sounds clear to auscultation       Cardiovascular hypertension (155/67 preop), Pt. on medications + CAD (cath 2011 nonobstructive)   Rhythm:regular Rate:Normal  Echo 2019 - Left ventricle: The cavity size was normal. There was mild    concentric hypertrophy. Systolic function was vigorous. The    estimated ejection fraction was in the range of 65% to 70%. Wall    motion was normal; there were no regional wall motion    abnormalities. Doppler parameters are consistent with abnormal    left ventricular relaxation (grade 1 diastolic dysfunction).     Neuro/Psych  Headaches PSYCHIATRIC DISORDERS Anxiety Depression       GI/Hepatic negative GI ROS, Neg liver ROS,,,  Endo/Other  diabetes, Well Controlled, Type 2, Oral Hypoglycemic AgentsHypothyroidism    Renal/GU negative Renal ROS  negative genitourinary   Musculoskeletal negative musculoskeletal ROS (+)    Abdominal   Peds  Hematology  (+) Blood dyscrasia, anemia Hb 11.7, plt 277   Anesthesia Other Findings   Reproductive/Obstetrics negative OB ROS                             Anesthesia Physical Anesthesia Plan  ASA: 3  Anesthesia Plan: MAC and Regional   Post-op Pain Management: Regional block* and Tylenol PO (pre-op)*   Induction: Intravenous  PONV Risk Score and Plan: 2 and Propofol infusion and TIVA  Airway Management Planned: Natural Airway and Simple Face Mask  Additional Equipment: None  Intra-op Plan:   Post-operative Plan:   Informed Consent: I have reviewed  the patients History and Physical, chart, labs and discussed the procedure including the risks, benefits and alternatives for the proposed anesthesia with the patient or authorized representative who has indicated his/her understanding and acceptance.       Plan Discussed with: CRNA, Anesthesiologist and Surgeon  Anesthesia Plan Comments:         Anesthesia Quick Evaluation

## 2023-09-27 NOTE — TOC CM/SW Note (Signed)
 Transition of Care [facility]) - Inpatient Brief Assessment   Patient Details  Name: Brandi Mahoney MRN: [id number] Date of Birth: Jul 09, 1955  [facility] CM/SW Contact:    Gala Lewandowsky, RN Phone Number: 09/27/2023, 12:16 PM   Clinical Narrative: Patient post cardiac cath-right ulnar pseudoaneurysm. PTA patient was from home with spouse. Patient is independent and she has PCP-gets to appointments without any issues. No home needs identified during the visit.    Transition of Care Asessment: Insurance and Status: Insurance coverage has been reviewed Patient has primary care physician: Yes Home environment has been reviewed: reviewed Prior level of function:: independent Prior/Current Home Services: No current home services Social Drivers of Health Review: SDOH reviewed no interventions necessary Readmission risk has been reviewed: Yes Transition of care needs: no transition of care needs at this time

## 2023-09-27 NOTE — Transfer of Care (Signed)
 Immediate Anesthesia Transfer of Care Note  Patient: Brandi Mahoney  Procedure(s) Performed: 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 (Right)  Patient Location: PACU  Anesthesia Type:General  Level of Consciousness: awake, alert , and oriented  Airway & Oxygen Therapy: Patient Spontanous Breathing and Patient connected to nasal cannula oxygen  Post-op Assessment: Report given to RN and Post -op Vital signs reviewed and stable  Post vital signs: Reviewed and stable  Last Vitals:  Vitals Value Taken Time  BP 141/63 09/27/23 1047  Temp    Pulse 68 09/27/23 1050  Resp 13 09/27/23 1050  SpO2 97 % 09/27/23 1050  Vitals shown include unfiled device data.  Last Pain:  Vitals:   09/27/23 0910  TempSrc:   PainSc: 5       Patients Stated Pain Goal: 4 (09/25/23 1027)  Complications: No notable events documented.

## 2023-09-27 NOTE — Op Note (Signed)
    OPERATIVE NOTE  PROCEDURE:   Right ulnar artery pseudoaneurysm repair  PRE-OPERATIVE DIAGNOSIS: Right ulnar artery pseudoaneurysm   POST-OPERATIVE DIAGNOSIS: same as above   SURGEON: Daria Pastures MD  ASSISTANT(S): Lianne Cure, PA  The assistant was necessary in order to expedient the procedure and safely perform the technical aspects of the operation.  The assistant provided traction and countertraction to assist with exposure of the artery.  ANESTHESIA: regional  ESTIMATED BLOOD LOSS: minimal  FINDING(S): Right ulnar artery pseudoaneurysm partially thrombosed Small 2mm defect in anterior wall of ulnar artery  SPECIMEN(S):  none  INDICATIONS:   Brandi Mahoney is a 68 y.o. female with Significant history of type 2 diabetes, HLD and CAD.  She underwent left heart catheterization with PCI yesterday via right radial artery.  She was noted to have bruising and swelling in the right arm postoperatively and a duplex demonstrated an ulnar artery pseudoaneurysm.  The ulnar and radial artery were patent.  Given her increasing pain and numbness, a pseudoaneurysm repair was offered.  Risk and benefits were reviewed, she expressed understanding and elected to proceed.  DESCRIPTION: The patient was brought to the operating room after a regional block was performed by anesthesia.  She was positioned supine on the operating room table and IV sedation was administered by the anesthesia team.  The right arm was prepped and draped in usual sterile fashion and a timeout was performed. The right ulnar artery was mapped with ultrasound. The level of the pseudoaneurysm was marked over the skin.  A longitudinal incision was then made overlying the injury.  Dissection was carried with electrocautery.  The pseudoaneurysm was then entered and pulsatile bleeding was encountered.  Control was established with manual pressure overlying the proximal ulnar artery.  The overlying thrombus was removed and  the arterial injury was identified.  This was small and approximately 2 mm in the anterior wall.  This was repaired with a single 6-0 Prolene suture.  Doppler was then used to evaluate the repair.  There was a multiphasic signal proximal and distal to the repair.  Hemostatic agent was placed.  The wound was then copiously irrigated and hemostasis was achieved with electrocautery.  The wound was then closed in layers with 3-0 Vicryl and 4-0 Monocryl for the skin.  Dermabond was applied. All counts were correct at the end of the procedure.  The patient tolerated the procedure well and was brought to recovery in stable condition.    COMPLICATIONS: none apparent  CONDITION: stable  Daria Pastures MD [facility] Phone Number: [phone] 09/27/2023 11:06 AM

## 2023-09-27 NOTE — Discharge Summary (Addendum)
 Discharge Summary    Patient ID: [name] MRN: [id number]; DOB: 1955/10/10  Admit date: 09/25/2023 Discharge date: 09/27/2023  PCP:  Hannah Beat, MD   [facility] Providers Cardiologist:  Julien Nordmann, MD     Discharge Diagnoses    Principal Problem:   Dyspnea Active Problems:   Coronary artery disease involving native coronary artery of native heart with angina pectoris (HCC)   Unstable angina (HCC)   S/P coronary artery stent placement   Pseudoaneurysm following procedure [facility])   Diagnostic Studies/Procedures    Cath: 09/25/2023  Single vessel obstructive CAD involving the proximal LAD Normal LV function Normal LVEDP Successful PCI of the proximal LAD with DES x 1   Plan: DAPT for one year. Anticipate same day DC _____________   History of Present Illness     Brandi Mahoney is a 68 y.o. female with past medical history of mild CAD, hyperlipidemia, anxiety, depression, diabetes, OSA who is normally followed by Dr. Mariah Milling as an outpatient.  She presented to the office on 4/7 and seen by Dr. Clifton James in the setting of chest pain.  She was admitted in 2011 with chest pain and elevated troponin.  Underwent cardiac catheterization which showed mild CAD and her presentation was felt to be secondary to viral myocarditis.  Echocardiogram 09/2017 with LVEF of 65 to 70% with no significant valvular disease.  Underwent nuclear stress test May 2020 with no ischemia.  Given her symptoms were concerning for unstable angina she was set up for outpatient cardiac catheterization.  Hospital Course     Consultants: [facility]   CAD  -- Patient underwent left heart cath 4/8 and received DES to the proximal LAD.  No significant coronary artery disease otherwise.  Initially planned to be a same-day discharge but was held overnight due to complaints of right wrist pain, shortness of breath, shoulder pain -- Recommendations for DAPT with aspirin/Effient for at least 1 year. --  Continue fenofibrate 160 mg daily, Crestor 40 mg daily, metoprolol supinate 25 mg daily   Right wrist pain  Right ulnar artery pseudoaneurysm --Ultrasound Doppler positive for 3 x 1 cm pseudoaneurysm which was not amendable to thrombin injection.  Underwent ultrasound-guided compression without resolution or thrombosis. -- By [facility] and underwent ulnar pseudoaneurysm repair with Dr. Hetty Blend on 4/10 doubt complications.  Cleared from vascular surgery to discharge home.  Follow-up has been arranged in their office.  Patient was seen by Dr. Tresa Endo and deemed stable for discharge.  Follow-up arranged in the office.  Medication sent to patient's pharmacy of choice.  Discharge Vitals Blood pressure (!) 110/47, pulse 62, temperature 98 F (36.7 C), temperature source Oral, resp. rate 18, height 5\' 4"  (1.626 m), weight 71.2 kg, SpO2 97%.  Filed Weights   09/25/23 1024 09/25/23 2004 09/27/23 0855  Weight: 71.2 kg 70.8 kg 71.2 kg    Labs & Radiologic Studies    CBC Recent Labs    09/25/23 2034 09/26/23 0430  WBC 9.1 7.7  HGB 11.9* 11.7*  HCT 36.3 36.4  MCV 81.8 82.7  PLT 309 277   Basic Metabolic Panel Recent Labs    14/78/29 2034 09/26/23 0430  NA  --  138  K  --  3.6  CL  --  101  CO2  --  27  GLUCOSE  --  119*  BUN  --  13  CREATININE 0.68 0.79  CALCIUM  --  9.4   Liver Function Tests No results for input(s): "AST", "  ALT", "ALKPHOS", "BILITOT", "PROT", "ALBUMIN" in the last 72 hours. No results for input(s): "LIPASE", "AMYLASE" in the last 72 hours. High Sensitivity Troponin:   Recent Labs  Lab 09/17/23 1232 09/17/23 1406  TROPONINIHS 5 5    BNP Invalid input(s): "POCBNP" D-Dimer No results for input(s): "DDIMER" in the last 72 hours. Hemoglobin A1C No results for input(s): "HGBA1C" in the last 72 hours. Fasting Lipid Panel No results for input(s): "CHOL", "HDL", "LDLCALC", "TRIG", "CHOLHDL", "LDLDIRECT" in the last 72 hours. Thyroid Function Tests No results for  input(s): "TSH", "T4TOTAL", "T3FREE", "THYROIDAB" in the last 72 hours.  Invalid input(s): "FREET3" _____________  VAS [place] UPPER EXTREMITY ARTERIAL DUPLEX Result Date: 09/26/2023  UPPER EXTREMITY DUPLEX STUDY Patient Name:  Brandi Mahoney  Date of Exam:   09/26/2023 Medical Rec #: [id number]         Accession #:    [id number] Date of Birth: 1955/12/27         Patient Gender: F Patient Age:   48 years Exam Location:  [facility] Procedure:      VAS [place] UPPER EXTREMITY ARTERIAL DUPLEX Referring Phys: Robet Leu --------------------------------------------------------------------------------  Indications: Bruising and wrist pain. History:     Patient has a history of S/P ulnar cath access.  Risk Factors: Hyperlipidemia, coronary artery disease. Performing Technologist: Inola Sink Sturdivant-Jones RDMS, RVT  Examination Guidelines: A complete evaluation includes B-mode imaging, spectral Doppler, color Doppler, and power Doppler as needed of all accessible portions of each vessel. Bilateral testing is considered an integral part of a complete examination. Limited examinations for reoccurring indications may be performed as noted.  Right Doppler Findings: +-------------+----------+--------+--------+--------+ Site         PSV (cm/s)WaveformStenosisComments +-------------+----------+--------+--------+--------+ Brachial Prox87                                 +-------------+----------+--------+--------+--------+ Brachial Dist89                                 +-------------+----------+--------+--------+--------+ Radial Dist  39                                 +-------------+----------+--------+--------+--------+ Ulnar Dist   38                                 +-------------+----------+--------+--------+--------+   An area with well defined borders measuring 3.4 cm x 0.8 cm was visualized arising off of the ulnar artery with ultrasound characteristics of a pseudoaneurysm. Mixed echos  within the structure suggest that it is partially thrombosed with a residual diameter of 0.64 cm x 0.57 cm. The neck measures approximately 0.18 cm wide and 0.13 cm long.  Summary:  Right: Positive right ulnar artery pseudoaneurysm. *See table(s) above for measurements and observations. Electronically signed by Sherald Hess MD on 09/26/2023 at 11:52:38 AM.    Final    CARDIAC CATHETERIZATION Result Date: 09/25/2023 Single vessel obstructive CAD involving the proximal LAD Normal LV function Normal LVEDP Successful PCI of the proximal LAD with DES x 1 Plan: DAPT for one year. Anticipate same day DC   CT Angio Chest PE W and/or Wo Contrast Result Date: 09/17/2023 CLINICAL DATA:  Pulmonary embolism (PE) suspected, high prob EXAM: CT ANGIOGRAPHY CHEST WITH CONTRAST TECHNIQUE:  Multidetector CT imaging of the chest was performed using the standard protocol during bolus administration of intravenous contrast. Multiplanar CT image reconstructions and MIPs were obtained to evaluate the vascular anatomy. RADIATION DOSE REDUCTION: This exam was performed according to the departmental dose-optimization program which includes automated exposure control, adjustment of the mA and/or kV according to patient size and/or use of iterative reconstruction technique. CONTRAST:  75mL OMNIPAQUE IOHEXOL 350 MG/ML SOLN COMPARISON:  CTA chest dated 06/22/2016. FINDINGS: Cardiovascular: Satisfactory opacification of the pulmonary arteries to the segmental level. No evidence of pulmonary embolism. Normal heart size. No pericardial effusion. Foci of coronary artery calcifications. Thoracic aorta is normal in caliber with mild atherosclerotic calcification. Mediastinum/Nodes: No enlarged mediastinal, hilar, or axillary lymph nodes. Thyroid gland, trachea, and esophagus demonstrate no significant findings. Lungs/Pleura: Mild bibasilar linear atelectasis/scarring. No suspicious pulmonary nodule. No focal consolidation, pleural effusion, or  pneumothorax. Upper Abdomen: Grossly stable partially calcified splenic cyst. No acute abnormality. Musculoskeletal: No chest wall abnormality. No acute or significant osseous findings. Review of the MIP images confirms the above findings. IMPRESSION: 1. No evidence of pulmonary embolism. 2. No acute intrathoracic findings. Aortic Atherosclerosis (ICD10-I70.0). Electronically Signed   By: Hart Robinsons M.D.   On: 09/17/2023 15:50   DG Chest 2 View Result Date: 09/17/2023 CLINICAL DATA:  Chest pain and shortness of breath. EXAM: CHEST - 2 VIEW COMPARISON:  MRI of the chest 10/10/2020. FINDINGS: The cardiac silhouette is within normal limits. There is prominence of the right paratracheal stripe corresponding to ectatic vasculature on MRI. Both lungs are clear. The visualized skeletal structures are unremarkable. IMPRESSION: No active cardiopulmonary disease. Electronically Signed   By: Darliss Cheney M.D.   On: 09/17/2023 15:08   Disposition   Pt is being discharged home today in good condition.  Follow-up Plans & Appointments     Follow-up Information     Daria Pastures, MD Follow up in 2 week(s).   Specialty: Vascular Surgery Why: Office will call you to arrange your appt (sent) Contact information: [address] [place] [address] [phone]                Discharge Instructions     Amb Referral to Cardiac Rehabilitation   Complete by: As directed    Diagnosis: Coronary Stents   After initial evaluation and assessments completed: Virtual Based Care may be provided alone or in conjunction with Phase 2 Cardiac Rehab based on patient barriers.: Yes   Intensive Cardiac Rehabilitation (ICR) [facility] location only OR Traditional Cardiac Rehabilitation (TCR) *If criteria for ICR are not met will enroll in TCR [facility] only): Yes   Diet - low sodium heart healthy   Complete by: As directed    Increase activity slowly   Complete by: As directed         Discharge Medications    Allergies as of 09/27/2023       Reactions   Cat Dander Shortness Of Breath   Pseudoeph-doxylamine-dm-apap Anaphylaxis   Allergy to nyquil   Gluten Other (See Comments)   Gi upset   Penicillins Itching, Rash        Medication List     STOP taking these medications    pravastatin 40 MG tablet Commonly known as: PRAVACHOL       TAKE these medications    Accu-Chek Guide test strip Generic drug: glucose blood Use to check blood sugar daily   Accu-Chek Softclix Lancets lancets Use to check blood sugar daily   albuterol 108 (  90 Base) MCG/ACT inhaler Commonly known as: VENTOLIN HFA Inhale 2 puffs into the lungs every 6 (six) hours as needed for wheezing or shortness of breath.   aspirin EC 81 MG tablet Take 81 mg by mouth daily.   clobetasol ointment 0.05 % Commonly known as: TEMOVATE Apply 1 Application topically daily as needed (skin conditions).   CULTURELLE ADULT ULT BALANCE PO Take 1 tablet by mouth daily.   fenofibrate 160 MG tablet Take 1 tablet (160 mg total) by mouth daily.   gabapentin 300 MG capsule Commonly known as: NEURONTIN Take 300 mg by mouth at bedtime.   levothyroxine 75 MCG tablet Commonly known as: SYNTHROID TAKE ONE TABLET BY MOUTH EVERY MORNING BEFORE BREAKFAST   Magnesium 250 MG Tabs Take 250 mg by mouth daily.   metFORMIN 500 MG 24 hr tablet Commonly known as: GLUCOPHAGE-XR TAKE FOUR TABLETS BY MOUTH EVERY MORNING WITH BREAKFAST What changed: See the new instructions.   metoprolol succinate 50 MG 24 hr tablet Commonly known as: TOPROL-XL Take 1 tablet (50 mg total) by mouth daily. Take with or immediately following a meal. What changed: See the new instructions.   nitroGLYCERIN 0.4 MG SL tablet Commonly known as: NITROSTAT Place 1 tablet under tongue at onset of chest pain; you may repeat every 5 minutes for up to 3 doses. What changed: additional instructions   ONE-A-DAY WOMENS PETITES PO Take 1 tablet by mouth  daily.   pantoprazole 40 MG tablet Commonly known as: PROTONIX Take 1 tablet (40 mg total) by mouth daily.   prasugrel 10 MG Tabs tablet Commonly known as: EFFIENT Take 1 tablet (10 mg total) by mouth daily.   rosuvastatin 40 MG tablet Commonly known as: Crestor Take 1 tablet (40 mg total) by mouth daily.   sertraline 50 MG tablet Commonly known as: ZOLOFT TAKE 1 TABLET BY MOUTH DAILY   traZODone 100 MG tablet Commonly known as: DESYREL Take 1 tablet (100 mg total) by mouth at bedtime.         Outstanding Labs/Studies   N/a   Duration of Discharge Encounter: APP Time: 20 minutes   Signed, Laverda Page, NP 09/27/2023, 1:07 PM  Patient seen and examined. Agree with assessment and plan.  Patient is back from operating room.  During the evening, she developed recurrent ulnar pain with numbness she was taken to the OR this morning for a right ulnar artery pseudoaneurysm which was partially thrombosed.  Surgery was done by Dr. Hetty Blend after the patient was evaluated this morning by Dr. Sherald Hess.  Longitudinal excision was made overlying the injury and careful dissection was performed.  She required a single 6-0 Prolene suture for closure of a 2 mm aperture in the arterial artery.  Presently, she is pain-free.  There is no chest pain or shortness of breath.  There is no JVD.  Her lungs are clear.  Rhythm is regular without S3 gallop.  Right wrist operative site appears stable.  Abdomen is soft nontender.  There is no edema.  She is status post DES stent to the proximal LAD by Dr. [place] in September 25, 2023.  Will need to resume aspirin and Effient with recent stent.  Patient will need aggressive lipid management.  She is on rosuvastatin 40 mg.  LP(a) is pending.  Per Dr. Hetty Blend, plan discharge today.  DC time 30 minutes Lennette Bihari, MD, [facility] 09/27/2023 1:13 PM

## 2023-09-27 NOTE — Progress Notes (Signed)
 Seen yesterday with right ulnar pseudoaneurysm after cardiac cath.  Most of this was thrombosed on duplex.  Had tolerable symptoms yesterday and plan was follow-up next week with hopes this would thrombose.  Overnight increasing pain and now some numbness.  I have discussed plans for ulnar pseudoaneurysm repair and she is posted for Dr. Hetty Blend today who has OR availability.  I also discussed with the patient.  Cephus Shelling, MD [facility] [place] Office: [phone]   Cephus Shelling

## 2023-09-28 ENCOUNTER — Encounter (HOSPITAL_COMMUNITY): Payer: Self-pay | Admitting: Vascular Surgery

## 2023-09-28 NOTE — Anesthesia Postprocedure Evaluation (Signed)
 Anesthesia Post Note  Patient: Brandi Mahoney  Procedure(s) Performed: 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 (Right)     Patient location during evaluation: PACU Anesthesia Type: General Level of consciousness: awake and alert Pain management: pain level controlled Vital Signs Assessment: post-procedure vital signs reviewed and stable Respiratory status: spontaneous breathing, nonlabored ventilation, respiratory function stable and patient connected to nasal cannula oxygen Cardiovascular status: blood pressure returned to baseline and stable Postop Assessment: no apparent nausea or vomiting Anesthetic complications: no   No notable events documented.  Last Vitals:  Vitals:   09/27/23 1143 09/27/23 1222  BP: (!) 115/45 (!) 110/47  Pulse: 65 62  Resp: 10 18  Temp: 36.9 C 36.7 C  SpO2: 98% 97%    Last Pain:  Vitals:   09/27/23 1240  TempSrc:   PainSc: 7                  Latishia Suitt S

## 2023-09-30 ENCOUNTER — Encounter: Payer: Self-pay | Admitting: Cardiology

## 2023-10-02 ENCOUNTER — Ambulatory Visit (INDEPENDENT_AMBULATORY_CARE_PROVIDER_SITE_OTHER): Admitting: Physician Assistant

## 2023-10-02 ENCOUNTER — Encounter

## 2023-10-02 DIAGNOSIS — T81718A Complication of other artery following a procedure, not elsewhere classified, initial encounter: Secondary | ICD-10-CM

## 2023-10-02 DIAGNOSIS — I729 Aneurysm of unspecified site: Secondary | ICD-10-CM

## 2023-10-02 NOTE — Progress Notes (Signed)
 POST OPERATIVE OFFICE NOTE    CC:  F/u for surgery  HPI:  Brandi Mahoney is a 68 y.o. female who is here for incision check. She recently underwent direct repair of a right ulnar artery pseudoaneurysm on 09/27/2023 by Dr.Buckley. Her pseudoaneurysm formed after cardiac cath.  She returns today for follow up. She says she is feeling better after surgery. She doesn't have any more tingling in her hand or fingers. Sometimes her right arm and hand feel achy, however this is getting better. She denies any hand coldness or weakness.   Allergies  Allergen Reactions   Cat Dander Shortness Of Breath   Pseudoeph-Doxylamine-Dm-Apap Anaphylaxis    Allergy to nyquil   Gluten Other (See Comments)    Gi upset   Penicillins Itching and Rash    Current Outpatient Medications  Medication Sig Dispense Refill   Accu-Chek Softclix Lancets lancets Use to check blood sugar daily 100 each 3   aspirin  EC 81 MG tablet Take 81 mg by mouth daily.     clobetasol  ointment (TEMOVATE ) 0.05 % Apply 1 Application topically daily as needed (skin conditions).     fenofibrate  160 MG tablet Take 1 tablet (160 mg total) by mouth daily. 90 tablet 3   gabapentin  (NEURONTIN ) 300 MG capsule Take 300 mg by mouth at bedtime.     glucose blood (ACCU-CHEK GUIDE) test strip Use to check blood sugar daily 100 each 3   Lactobacillus-Inulin (CULTURELLE ADULT ULT BALANCE PO) Take 1 tablet by mouth daily.     levothyroxine  (SYNTHROID ) 75 MCG tablet TAKE ONE TABLET BY MOUTH EVERY MORNING BEFORE BREAKFAST 90 tablet 3   Magnesium  250 MG TABS Take 250 mg by mouth daily.     metFORMIN  (GLUCOPHAGE -XR) 500 MG 24 hr tablet TAKE FOUR TABLETS BY MOUTH EVERY MORNING WITH BREAKFAST (Patient taking differently: Take 1,000 mg by mouth 2 (two) times daily with a meal.) 360 tablet 0   metoprolol  succinate (TOPROL -XL) 50 MG 24 hr tablet Take 1 tablet (50 mg total) by mouth daily. Take with or immediately following a meal. 90 tablet 3   Multiple  Vitamins-Minerals (ONE-A-DAY WOMENS PETITES PO) Take 1 tablet by mouth daily.     nitroGLYCERIN  (NITROSTAT ) 0.4 MG SL tablet Place 1 tablet under tongue at onset of chest pain; you may repeat every 5 minutes for up to 3 doses. 25 tablet 3   pantoprazole  (PROTONIX ) 40 MG tablet Take 1 tablet (40 mg total) by mouth daily. 90 tablet 3   prasugrel  (EFFIENT ) 10 MG TABS tablet Take 1 tablet (10 mg total) by mouth daily. 30 tablet 11   rosuvastatin  (CRESTOR ) 40 MG tablet Take 1 tablet (40 mg total) by mouth daily. 90 tablet 3   sertraline  (ZOLOFT ) 50 MG tablet TAKE 1 TABLET BY MOUTH DAILY 90 tablet 1   traZODone  (DESYREL ) 100 MG tablet Take 1 tablet (100 mg total) by mouth at bedtime. 30 tablet 5   albuterol  (VENTOLIN  HFA) 108 (90 Base) MCG/ACT inhaler Inhale 2 puffs into the lungs every 6 (six) hours as needed for wheezing or shortness of breath. (Patient not taking: Reported on 10/02/2023) 8 g 2   No current facility-administered medications for this visit.     ROS:  See HPI  Physical Exam:   Incision:  right wrist incision intact and well appearing Extremities:  resolving bruising of right forearm and wrist. Palpable right radial pulse. Brisk right ulnar and palmar arch signals Neuro: right hand grip and sensation 5/5  Assessment/Plan:  [name] who is here for incision check  -The patient recently underwent direct repair of a right ulnar artery pseudoaneurysm.  This formed after cardiac catheterization via ulnar artery access - Her right arm incision is well-appearing without signs of infection - Her right hand is warm and well-perfused with a palpable radial pulse and brisk ulnar and palmar arch Doppler signals - She no longer has any tingling or weakness in the right hand.  She endorses occasional achiness in the forearm and wrist - I have recommended that she continue to keep her incision clean and dry.  She can elevate her arm as needed.  She can keep her follow-up with  our office in May for repeat incision check   Deneise Finlay, PA-C [facility] [phone]   Clinic MD:  [place] Job

## 2023-10-05 ENCOUNTER — Ambulatory Visit
Admission: EM | Admit: 2023-10-05 | Discharge: 2023-10-05 | Disposition: A | Discharge status: Home / Self Care | Attending: Emergency Medicine | Admitting: Emergency Medicine

## 2023-10-05 DIAGNOSIS — H6692 Otitis media, unspecified, left ear: Secondary | ICD-10-CM

## 2023-10-05 MED ORDER — AZITHROMYCIN 250 MG PO TABS: 250.0000 mg | ORAL_TABLET | Freq: Every day | ORAL | 0 refills | Status: DC

## 2023-10-05 NOTE — ED Triage Notes (Addendum)
 Patient to Urgent Care with complaints of left sided ear pain. Denies any drainage. Low grade temps.  Symptoms started yesterday. Also nasal congestion/ post nasal drip/ sore throat.  Meds: tylenol .

## 2023-10-05 NOTE — ED Provider Notes (Signed)
 Brandi Mahoney    CSN: [id number] Arrival date & time: 10/05/23  1824      History   Chief Complaint Chief Complaint  Patient presents with   Otalgia    HPI Brandi Mahoney is a 68 y.o. female.  Patient presents with left ear pain since yesterday.  She has had postnasal drip, congestion, sore throat also.  No fever, cough, shortness of breath.  She took Tylenol  for her discomfort.  The history is provided by the patient and medical records.    Past Medical History:  Diagnosis Date   CAD (coronary artery disease)    a. 01/2010 Cath: mild atherosclerosis in the mid LCX after takeoff of OM1, mild diff LAD dzs;  b. 07/2013 MV: EF 67%, no ischemia.   GAD (generalized anxiety disorder)    History of kidney stones    HLD (hyperlipidemia)    Hypothyroidism    Major depressive disorder, recurrent episode, in full remission (HCC) 09/08/2008   Qualifier: Diagnosis of  By: Geralyn Knee MD, [place]     Migraine headache    Myocarditis (HCC)    a. 01/2010 presented w/ c/p and elev trop-->Cath: mild atherosclerosis in the mid LCX after takeoff of OM1, mild diff LAD dzs;  b. 03/2010 Echo: EF 60-65%, DD, mild LVH;  c. 07/2011 Echo: EF 55-60%, mild LVH, no rwma.   Sleep apnea    has c-pap.  wears off and on - not lately  using  per reported 07-29-2020   Type 2 diabetes, diet controlled [facility])     Patient Active Problem List   Diagnosis Date Noted   Pseudoaneurysm following procedure (HCC) 09/27/2023   Dyspnea 09/25/2023   Unstable angina (HCC) 09/25/2023   S/P coronary artery stent placement 09/25/2023   Lichen sclerosus 03/13/2017   Coronary artery disease involving native coronary artery of native heart with angina pectoris (HCC)    Type 2 Diabetes    GAD (generalized anxiety disorder)    Obstructive sleep apnea 09/25/2013   Tobacco abuse, in remission 09/19/2013   IBS (irritable bowel syndrome) 09/26/2012   Mixed hyperlipidemia 04/01/2010   Viral myocarditis, 01/2010 01/27/2010    HYPERTRIGLYCERIDEMIA 07/21/2009   Hypothyroidism in adult 09/08/2008   Major depressive disorder, recurrent episode, in full remission (HCC) 09/08/2008   Asthma 09/08/2008    Past Surgical History:  Procedure Laterality Date   APPENDECTOMY  1974   BREAST SURGERY Right 1988   fatty tumore from breast   CARDIAC CATHETERIZATION  01/2010   no significant -[facility] Dr Jerelene Monday,   CHOLECYSTECTOMY  2002   COLONOSCOPY  2017   CORONARY STENT INTERVENTION N/A 09/25/2023   Procedure: CORONARY STENT INTERVENTION;  Surgeon: Swaziland, Peter M, MD;  [facility]VAS[facility] CV LAB;  Service: Cardiovascular;  Laterality: N/A;   CYST EXCISION     ovarian cyst 1994   INSERTION OF ARTERIOVENOUS (AV) ARTEGRAFT ARM Right 09/27/2023   Procedure: 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;  Surgeon: Philipp Brawn, MD;  Location: [facility] OR;  Service: Vascular;  Laterality: Right;   LEFT HEART CATH AND CORONARY ANGIOGRAPHY N/A 09/25/2023   Procedure: LEFT HEART CATH AND CORONARY ANGIOGRAPHY;  Surgeon: Swaziland, Peter M, MD;  Location: [facility] INVASIVE CV LAB;  Service: Cardiovascular;  Laterality: N/A;   NEPHROLITHOTOMY Right 08/20/2014   Procedure: RIGHT PERCUTANEOUS NEPHROLITHOTOMY ;  Surgeon: Willye Harvey, MD;  Location: [facility] ORS;  Service: Urology;  Laterality: Right;   POLYPECTOMY     RECTAL EXAM UNDER ANESTHESIA N/A  08/02/2020   Procedure: Harlan Liberty UNDER ANESTHESIA INJECTION OF PERIANAL BOTOX ;  Surgeon: Melvenia Stabs, MD;  Location: [facility] ;  Service: General;  Laterality: N/A;   TOTAL ABDOMINAL HYSTERECTOMY W/ BILATERAL SALPINGOOPHORECTOMY  2000   no ovaries, uterus, or cervix.    OB History     Gravida  4   Para  3   Term      Preterm      AB  1   Living         SAB  1   IAB      Ectopic      Multiple      Live Births           Obstetric Comments  1st Menstrual Cycle:    1st Pregnancy:  22          Home Medications    Prior to Admission  medications   Medication Sig Start Date End Date Taking? Authorizing Provider  azithromycin  (ZITHROMAX ) 250 MG tablet Take 1 tablet (250 mg total) by mouth daily. Take first 2 tablets together, then 1 every day until finished. 10/05/23  Yes Wellington Half, NP  Accu-Chek Softclix Lancets lancets Use to check blood sugar daily 08/23/22   Copland, Jolena Nay, MD  albuterol  (VENTOLIN  HFA) 108 (90 Base) MCG/ACT inhaler Inhale 2 puffs into the lungs every 6 (six) hours as needed for wheezing or shortness of breath. Patient not taking: Reported on 10/02/2023 09/17/23   Suellyn Emory, MD  aspirin  EC 81 MG tablet Take 81 mg by mouth daily.    [provider]  clobetasol  ointment (TEMOVATE ) 0.05 % Apply 1 Application topically daily as needed (skin conditions). 05/31/22   [provider]  fenofibrate  160 MG tablet Take 1 tablet (160 mg total) by mouth daily. 09/25/23   Debria Fang, PA-C  gabapentin  (NEURONTIN ) 300 MG capsule Take 300 mg by mouth at bedtime.    [provider]  glucose blood (ACCU-CHEK GUIDE) test strip Use to check blood sugar daily 08/23/22   Copland, Jolena Nay, MD  Lactobacillus-Inulin (CULTURELLE ADULT ULT BALANCE PO) Take 1 tablet by mouth daily. 06/25/17   [provider]  levothyroxine  (SYNTHROID ) 75 MCG tablet TAKE ONE TABLET BY MOUTH EVERY MORNING BEFORE BREAKFAST 02/23/23   Copland, Jolena Nay, MD  Magnesium  250 MG TABS Take 250 mg by mouth daily. 06/25/13   [provider]  metFORMIN  (GLUCOPHAGE -XR) 500 MG 24 hr tablet TAKE FOUR TABLETS BY MOUTH EVERY MORNING WITH BREAKFAST Patient taking differently: Take 1,000 mg by mouth 2 (two) times daily with a meal. 09/11/23   Copland, Jolena Nay, MD  metoprolol  succinate (TOPROL -XL) 50 MG 24 hr tablet Take 1 tablet (50 mg total) by mouth daily. Take with or immediately following a meal. 09/25/23   Debria Fang, PA-C  Multiple Vitamins-Minerals (ONE-A-DAY WOMENS PETITES PO) Take 1 tablet by mouth daily.     [provider]  nitroGLYCERIN  (NITROSTAT ) 0.4 MG SL tablet Place 1 tablet under tongue at onset of chest pain; you may repeat every 5 minutes for up to 3 doses. 09/25/23   Debria Fang, PA-C  pantoprazole  (PROTONIX ) 40 MG tablet Take 1 tablet (40 mg total) by mouth daily. 09/25/23   Debria Fang, PA-C  prasugrel  (EFFIENT ) 10 MG TABS tablet Take 1 tablet (10 mg total) by mouth daily. 09/26/23   Debria Fang, PA-C  rosuvastatin  (CRESTOR ) 40 MG tablet Take 1 tablet (40 mg total) by mouth daily. 09/25/23  Liane Redman R, PA-C  sertraline  (ZOLOFT ) 50 MG tablet TAKE 1 TABLET BY MOUTH DAILY 05/22/23   Copland, Jolena Nay, MD  traZODone  (DESYREL ) 100 MG tablet Take 1 tablet (100 mg total) by mouth at bedtime. 03/19/23   Copland, Jolena Nay, MD    Family History Family History  Problem Relation Age of Onset   Coronary artery disease Mother    Diabetes Mother    ALS Mother    Hyperlipidemia Father    Breast cancer Sister 41   Stomach cancer Maternal Grandfather    Colon cancer Maternal Grandfather        dx early 27's    Breast cancer Paternal Grandmother    Lung cancer Paternal Grandfather        CAD   Other Daughter        gluten   Colon cancer Son 88       3rd stage   Down syndrome Son    Esophageal cancer Neg Hx    Liver cancer Neg Hx    Pancreatic cancer Neg Hx    Rectal cancer Neg Hx     Social History Social History   Tobacco Use   Smoking status: Former    Current packs/day: 0.00    Average packs/day: 0.4 packs/day for 35.0 years (12.3 ttl pk-yrs)    Types: Cigarettes    Start date: 09/25/1973    Quit date: 09/25/2008    Years since quitting: 15.0   Smokeless tobacco: Never   Tobacco comments:    quit in 2007-2008, social smoker  Vaping Use   Vaping status: Never Used  Substance Use Topics   Alcohol use: Not Currently   Drug use: No     Allergies   Cat dander, Pseudoeph-doxylamine-dm-apap, Gluten, and Penicillins   Review of Systems Review  of Systems  Constitutional:  Negative for chills and fever.  HENT:  Positive for congestion, ear pain and postnasal drip. Negative for ear discharge and sore throat.   Respiratory:  Negative for cough and shortness of breath.      Physical Exam Triage Vital Signs ED Triage Vitals  Encounter Vitals Group     BP      Systolic BP Percentile      Diastolic BP Percentile      Pulse      Resp      Temp      Temp src      SpO2      Weight      Height      Head Circumference      Peak Flow      Pain Score      Pain Loc      Pain Education      Exclude from Growth Chart    No data found.  Updated Vital Signs BP (!) 144/75   Pulse 100   Temp 98.1 F (36.7 C)   Resp 18   SpO2 97%   Visual Acuity Right Eye Distance:   Left Eye Distance:   Bilateral Distance:    Right Eye Near:   Left Eye Near:    Bilateral Near:     Physical Exam Constitutional:      General: She is not in acute distress. HENT:     Right Ear: Tympanic membrane normal.     Left Ear: Tympanic membrane is erythematous.     Nose: Nose normal.     Mouth/Throat:     Mouth: Mucous membranes are moist.  Pharynx: Oropharynx is clear.  Cardiovascular:     Rate and Rhythm: Normal rate and regular rhythm.     Heart sounds: Normal heart sounds.  Pulmonary:     Effort: Pulmonary effort is normal. No respiratory distress.     Breath sounds: Normal breath sounds.  Neurological:     Mental Status: She is alert.      UC Treatments / Results  Labs (all labs ordered are listed, but only abnormal results are displayed) Labs Reviewed - No data to display  EKG   Radiology No results found.  Procedures Procedures (including critical care time)  Medications Ordered in UC Medications - No data to display  Initial Impression / Assessment and Plan / UC Course  I have reviewed the triage vital signs and the nursing notes.  Pertinent labs & imaging results that were available during my care of the  patient were reviewed by me and considered in my medical decision making (see chart for details).   Otitis media.  Patient is allergic to penicillin.  Treating with Zithromax .  Tylenol  as needed.  Instructed patient to follow up with her PCP if her symptoms are not improving.  She agrees to plan of care.   Final Clinical Impressions(s) / UC Diagnoses   Final diagnoses:  Left otitis media, unspecified otitis media type     Discharge Instructions      Take the Zithromax  as directed.  Follow-up with your primary care provider if your symptoms are not improving.      ED Prescriptions     Medication Sig Dispense Auth. Provider   azithromycin  (ZITHROMAX ) 250 MG tablet Take 1 tablet (250 mg total) by mouth daily. Take first 2 tablets together, then 1 every day until finished. 6 tablet Wellington Half, NP      PDMP not reviewed this encounter.   Wellington Half, NP 10/05/23 [phone]

## 2023-10-05 NOTE — Discharge Instructions (Addendum)
 Take the Zithromax as directed.  Follow up with your primary care provider if your symptoms are not improving.

## 2023-10-08 ENCOUNTER — Ambulatory Visit: Admitting: Family Medicine

## 2023-10-08 ENCOUNTER — Encounter: Payer: Self-pay | Admitting: Family Medicine

## 2023-10-08 VITALS — BP 146/80 | HR 95 | Temp 97.8°F | Ht 63.5 in | Wt 151.5 lb

## 2023-10-08 DIAGNOSIS — H65192 Other acute nonsuppurative otitis media, left ear: Secondary | ICD-10-CM

## 2023-10-08 DIAGNOSIS — R259 Unspecified abnormal involuntary movements: Secondary | ICD-10-CM | POA: Diagnosis not present

## 2023-10-08 MED ORDER — CEFDINIR 300 MG PO CAPS: 600.0000 mg | ORAL_CAPSULE | Freq: Every day | ORAL | 0 refills | Status: DC

## 2023-10-08 MED ORDER — HYDROCODONE BIT-HOMATROP MBR 5-1.5 MG/5ML PO SOLN: 5.0000 mL | Freq: Every evening | ORAL | 0 refills | Status: DC | PRN

## 2023-10-08 NOTE — Progress Notes (Signed)
 Mariaha Mahoney T. Tocarra Gassen, MD, CAQ Sports Medicine [facility] [address] [place], [address]  Phone: [phone]  FAX: [phone]  Brandi Mahoney - 68 y.o. female  MRN [id number]  Date of Birth: [birth year]  Date: 10/08/2023  PCP: Scherrie Curt, MD  Referral: Scherrie Curt, MD  Chief Complaint  Patient presents with   Ear Pain    Seen at Urgent Care 10/05/23   Subjective:   Brandi Mahoney is a 68 y.o. very pleasant female patient with Body mass index is 26.42 kg/m. who presents with the following:  L ear infection -patient is penicillin allergic, she was seen with an ear infection over the weekend in urgent care.  She was given azithromycin , she continues to have some persistent left-sided ear pain, does not really seem to be any better at all.  She has also had some runny nose cough and cold symptoms.  She has had a negative COVID-19 test.  No nausea, vomiting, diarrhea  Fine hand tremor over the last 6 months Worse with trying to do things with her hands She already has an established relationship with Dr. Mason Sole at neurology at [facility] She has a significant intentional tremor, she has a significant resting tremor at baseline She has no history of parkinsonism or Parkinson's disease in her family, but her sister does have a very severe essential tremor  Review of Systems is noted in the HPI, as appropriate  Objective:   BP (!) 146/80 (BP Location: Left Arm, Patient Position: Sitting, Cuff Size: Normal)   Pulse 95   Temp 97.8 F (36.6 C) (Temporal)   Ht 5' 3.5" (1.613 m)   Wt 151 lb 8 oz (68.7 kg)   SpO2 96%   BMI 26.42 kg/m    Gen: WDWN, NAD. Globally Non-toxic HEENT: Throat clear, w/o exudate, R TM clear, great deal of significant fluid, L TM -poor landmarks, bulging and reddish  rhinnorhea.  MMM Frontal sinuses: NT Max sinuses: NT NECK: Anterior cervical  LAD is absent CV: RRR, No M/G/R, cap refill <2  sec PULM: Breathing comfortably in no respiratory distress. no wheezing, crackles, rhonchi  Neuro, she does have a fine bilateral resting tremor  Laboratory and Imaging Data:  Assessment and Plan:     ICD-10-CM   1. Acute mucoid otitis media of left ear  H65.192     2. Mixed action and resting tremor  R25.9      Patient does have a left-sided otitis media.  I think that a cephalosporin would be likely more effective than azithromycin , since she is not improving on azithromycin  currently.  She is penicillin allergic  Hycodan for cough  With regards to her tremor and intentional tremor which seems to worsen at times, I think that the primary concern here would be potential Parkinson's disease or parkinsonism.  She has a neurologist already, so she will plan on calling him and following up with Dr. Mason Sole.  Medication Management during today's office visit: Meds ordered this encounter  Medications   cefdinir  (OMNICEF ) 300 MG capsule    Sig: Take 2 capsules (600 mg total) by mouth daily.    Dispense:  20 capsule    Refill:  0   HYDROcodone  bit-homatropine (HYCODAN) 5-1.5 MG/5ML syrup    Sig: Take 5 mLs by mouth at bedtime as needed for cough.    Dispense:  120 mL    Refill:  0   Medications Discontinued During This Encounter  Medication Reason   metFORMIN  (GLUCOPHAGE -XR) 500 MG 24 hr tablet Duplicate   azithromycin  (ZITHROMAX ) 250 MG tablet     Orders placed today for conditions managed today: No orders of the defined types were placed in this encounter.   Disposition: No follow-ups on file.  [employer] speech-to-text software was used for transcription in this dictation.  Possible transcriptional errors can occur using Animal nutritionist.   Signed,  Ranny Bye. Yarel Kilcrease, MD   Outpatient Encounter Medications as of 10/08/2023  Medication Sig   Accu-Chek Softclix Lancets lancets Use to check blood sugar daily   albuterol  (VENTOLIN  HFA) 108 (90 Base) MCG/ACT inhaler  Inhale 2 puffs into the lungs every 6 (six) hours as needed for wheezing or shortness of breath.   aspirin  EC 81 MG tablet Take 81 mg by mouth daily.   cefdinir  (OMNICEF ) 300 MG capsule Take 2 capsules (600 mg total) by mouth daily.   clobetasol  ointment (TEMOVATE ) 0.05 % Apply 1 Application topically daily as needed (skin conditions).   fenofibrate  160 MG tablet Take 1 tablet (160 mg total) by mouth daily.   gabapentin  (NEURONTIN ) 300 MG capsule Take 300 mg by mouth at bedtime.   glucose blood (ACCU-CHEK GUIDE) test strip Use to check blood sugar daily   HYDROcodone  bit-homatropine (HYCODAN) 5-1.5 MG/5ML syrup Take 5 mLs by mouth at bedtime as needed for cough.   Lactobacillus-Inulin (CULTURELLE ADULT ULT BALANCE PO) Take 1 tablet by mouth daily.   levothyroxine  (SYNTHROID ) 75 MCG tablet TAKE ONE TABLET BY MOUTH EVERY MORNING BEFORE BREAKFAST   Magnesium  250 MG TABS Take 250 mg by mouth daily.   metFORMIN  (GLUCOPHAGE -XR) 500 MG 24 hr tablet Take 1,000 mg by mouth 2 (two) times daily with a meal.   metoprolol  succinate (TOPROL -XL) 50 MG 24 hr tablet Take 1 tablet (50 mg total) by mouth daily. Take with or immediately following a meal.   Multiple Vitamins-Minerals (ONE-A-DAY WOMENS PETITES PO) Take 1 tablet by mouth daily.   nitroGLYCERIN  (NITROSTAT ) 0.4 MG SL tablet Place 1 tablet under tongue at onset of chest pain; you may repeat every 5 minutes for up to 3 doses.   pantoprazole  (PROTONIX ) 40 MG tablet Take 1 tablet (40 mg total) by mouth daily.   prasugrel  (EFFIENT ) 10 MG TABS tablet Take 1 tablet (10 mg total) by mouth daily.   rosuvastatin  (CRESTOR ) 40 MG tablet Take 1 tablet (40 mg total) by mouth daily.   sertraline  (ZOLOFT ) 50 MG tablet TAKE 1 TABLET BY MOUTH DAILY   traZODone  (DESYREL ) 100 MG tablet Take 1 tablet (100 mg total) by mouth at bedtime.   [DISCONTINUED] azithromycin  (ZITHROMAX ) 250 MG tablet Take 1 tablet (250 mg total) by mouth daily. Take first 2 tablets together, then 1  every day until finished.   [DISCONTINUED] metFORMIN  (GLUCOPHAGE -XR) 500 MG 24 hr tablet TAKE FOUR TABLETS BY MOUTH EVERY MORNING WITH BREAKFAST (Patient taking differently: Take 1,000 mg by mouth 2 (two) times daily with a meal.)   No facility-administered encounter medications on file as of 10/08/2023.

## 2023-10-15 DIAGNOSIS — H6983 Other specified disorders of Eustachian tube, bilateral: Secondary | ICD-10-CM | POA: Diagnosis not present

## 2023-10-15 DIAGNOSIS — H66003 Acute suppurative otitis media without spontaneous rupture of ear drum, bilateral: Secondary | ICD-10-CM | POA: Diagnosis not present

## 2023-10-16 NOTE — Progress Notes (Signed)
 Patient ID: Brandi Mahoney, female   DOB: 09/13/1955, 68 y.o.   MRN: [id number]  Reason for Consult: Routine Post Op   Referred by Scherrie Curt, MD  Subjective:     HPI  Brandi Mahoney is a 68 y.o. female who is about 3 weeks s/p ulnar artery pseudoaneurysm repair.  She reports her incision is healing well and denies any significant pain or tingling in the hand or fingers.  Denies any numbness or weakness in the hand or fingers.   Past Medical History:  Diagnosis Date   CAD (coronary artery disease)    a. 01/2010 Cath: mild atherosclerosis in the mid LCX after takeoff of OM1, mild diff LAD dzs;  b. 07/2013 MV: EF 67%, no ischemia.   GAD (generalized anxiety disorder)    History of kidney stones    HLD (hyperlipidemia)    Hypothyroidism    Major depressive disorder, recurrent episode, in full remission (HCC) 09/08/2008   Qualifier: Diagnosis of  By: Geralyn Knee MD, Spencer     Migraine headache    Myocarditis (HCC)    a. 01/2010 presented w/ c/p and elev trop-->Cath: mild atherosclerosis in the mid LCX after takeoff of OM1, mild diff LAD dzs;  b. 03/2010 Echo: EF 60-65%, DD, mild LVH;  c. 07/2011 Echo: EF 55-60%, mild LVH, no rwma.   Sleep apnea    has c-pap.  wears off and on - not lately  using  per reported 07-29-2020   Type 2 diabetes, diet controlled (HCC)    Family History  Problem Relation Age of Onset   Coronary artery disease Mother    Diabetes Mother    ALS Mother    Hyperlipidemia Father    Breast cancer Sister 62   Stomach cancer Maternal Grandfather    Colon cancer Maternal Grandfather        dx early 41's    Breast cancer Paternal Grandmother    Lung cancer Paternal Grandfather        CAD   Other Daughter        gluten   Colon cancer Son 42       3rd stage   Down syndrome Son    Esophageal cancer Neg Hx    Liver cancer Neg Hx    Pancreatic cancer Neg Hx    Rectal cancer Neg Hx    Past Surgical History:  Procedure Laterality Date   APPENDECTOMY  1974    BREAST SURGERY Right 1988   fatty tumore from breast   CARDIAC CATHETERIZATION  01/2010   no significant -[facility] Dr Jerelene Monday,   CHOLECYSTECTOMY  2002   COLONOSCOPY  2017   CORONARY STENT INTERVENTION N/A 09/25/2023   Procedure: CORONARY STENT INTERVENTION;  Surgeon: Swaziland, Peter M, MD;  Location: [facility] INVASIVE CV LAB;  Service: Cardiovascular;  Laterality: N/A;   CYST EXCISION     ovarian cyst 1994   INSERTION OF ARTERIOVENOUS (AV) ARTEGRAFT ARM Right 09/27/2023   Procedure: 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;  Surgeon: Philipp Brawn, MD;  Location: [facility] OR;  Service: Vascular;  Laterality: Right;   LEFT HEART CATH AND CORONARY ANGIOGRAPHY N/A 09/25/2023   Procedure: LEFT HEART CATH AND CORONARY ANGIOGRAPHY;  Surgeon: Swaziland, Peter M, MD;  Location: [facility] INVASIVE CV LAB;  Service: Cardiovascular;  Laterality: N/A;   NEPHROLITHOTOMY Right 08/20/2014   Procedure: RIGHT PERCUTANEOUS NEPHROLITHOTOMY ;  Surgeon: Willye Harvey, MD;  Location: [facility] ORS;  Service: Urology;  Laterality: Right;  POLYPECTOMY     RECTAL EXAM UNDER ANESTHESIA N/A 08/02/2020   Procedure: Wilkie Harding EXAM UNDER ANESTHESIA INJECTION OF PERIANAL BOTOX ;  Surgeon: Melvenia Stabs, MD;  Location:  SURGERY CENTER;  Service: General;  Laterality: N/A;   TOTAL ABDOMINAL HYSTERECTOMY W/ BILATERAL SALPINGOOPHORECTOMY  2000   no ovaries, uterus, or cervix.    Short Social History:  Social History   Tobacco Use   Smoking status: Former    Current packs/day: 0.00    Average packs/day: 0.4 packs/day for 35.0 years (12.3 ttl pk-yrs)    Types: Cigarettes    Start date: 09/25/1973    Quit date: 09/25/2008    Years since quitting: 15.0   Smokeless tobacco: Never   Tobacco comments:    quit in 2007-2008, social smoker  Substance Use Topics   Alcohol use: Not Currently    Allergies  Allergen Reactions   Cat Dander Shortness Of Breath   Pseudoeph-Doxylamine-Dm-Apap Anaphylaxis    Allergy to nyquil    Gluten Other (See Comments)    Gi upset   Penicillins Itching and Rash    Current Outpatient Medications  Medication Sig Dispense Refill   Accu-Chek Softclix Lancets lancets Use to check blood sugar daily 100 each 3   albuterol  (VENTOLIN  HFA) 108 (90 Base) MCG/ACT inhaler Inhale 2 puffs into the lungs every 6 (six) hours as needed for wheezing or shortness of breath. 8 g 2   aspirin  EC 81 MG tablet Take 81 mg by mouth daily.     cefdinir  (OMNICEF ) 300 MG capsule Take 2 capsules (600 mg total) by mouth daily. 20 capsule 0   clobetasol  ointment (TEMOVATE ) 0.05 % Apply 1 Application topically daily as needed (skin conditions).     fenofibrate  160 MG tablet Take 1 tablet (160 mg total) by mouth daily. 90 tablet 3   gabapentin  (NEURONTIN ) 300 MG capsule Take 300 mg by mouth at bedtime.     glucose blood (ACCU-CHEK GUIDE) test strip Use to check blood sugar daily 100 each 3   HYDROcodone  bit-homatropine (HYCODAN) 5-1.5 MG/5ML syrup Take 5 mLs by mouth at bedtime as needed for cough. 120 mL 0   Lactobacillus-Inulin (CULTURELLE ADULT ULT BALANCE PO) Take 1 tablet by mouth daily.     levothyroxine  (SYNTHROID ) 75 MCG tablet TAKE ONE TABLET BY MOUTH EVERY MORNING BEFORE BREAKFAST 90 tablet 3   Magnesium  250 MG TABS Take 250 mg by mouth daily.     metFORMIN  (GLUCOPHAGE -XR) 500 MG 24 hr tablet Take 1,000 mg by mouth 2 (two) times daily with a meal.     metoprolol  succinate (TOPROL -XL) 50 MG 24 hr tablet Take 1 tablet (50 mg total) by mouth daily. Take with or immediately following a meal. 90 tablet 3   Multiple Vitamins-Minerals (ONE-A-DAY WOMENS PETITES PO) Take 1 tablet by mouth daily.     nitroGLYCERIN  (NITROSTAT ) 0.4 MG SL tablet Place 1 tablet under tongue at onset of chest pain; you may repeat every 5 minutes for up to 3 doses. 25 tablet 3   pantoprazole  (PROTONIX ) 40 MG tablet Take 1 tablet (40 mg total) by mouth daily. 90 tablet 3   prasugrel  (EFFIENT ) 10 MG TABS tablet Take 1 tablet (10 mg total)  by mouth daily. 30 tablet 11   rosuvastatin  (CRESTOR ) 40 MG tablet Take 1 tablet (40 mg total) by mouth daily. 90 tablet 3   sertraline  (ZOLOFT ) 50 MG tablet TAKE 1 TABLET BY MOUTH DAILY 90 tablet 1   traZODone  (DESYREL ) 100 MG tablet  Take 1 tablet (100 mg total) by mouth at bedtime. 30 tablet 5   No current facility-administered medications for this visit.    REVIEW OF SYSTEMS   All other systems were reviewed and are negative     Objective:  Objective   Vitals:   10/19/23 0935  BP: 128/66  Pulse: 72  Temp: 98 F (36.7 C)  SpO2: 96%  Weight: 152 lb (68.9 kg)  Height: 5' 3.5" (1.613 m)   Body mass index is 26.5 kg/m.  Physical Exam General: no acute distress Cardiac: hemodynamically stable Pulm: normal work of breathing Neuro: alert, no focal deficit Extremities: no edema, cyanosis or wounds, left ulnar artery incision healing well, no erythema or drainage Vascular:   Palpable brachial, radial, ulnar bilateral      Assessment/Plan:     KADIESHA AYON is a 68 y.o. female who is now 3 weeks s/p right ulnar artery pseudoaneurysm repair. The incision is healing well and the bruising is resolving.  She is asymptomatic at time I explained that I would like to see her back 1 more time with a duplex to ensure that we had an adequate result of the pseudoaneurysm repair. Follow-up in 2 to 4 weeks at her convenience for a right arm duplex.  If this does not show any issue with the ulnar artery she can be released to follow-up as needed     Philipp Brawn MD [facility]

## 2023-10-19 ENCOUNTER — Ambulatory Visit: Attending: Vascular Surgery | Admitting: Vascular Surgery

## 2023-10-19 ENCOUNTER — Encounter: Payer: Self-pay | Admitting: Vascular Surgery

## 2023-10-19 VITALS — BP 128/66 | HR 72 | Temp 98.0°F | Ht 63.5 in | Wt 152.0 lb

## 2023-10-19 DIAGNOSIS — I729 Aneurysm of unspecified site: Secondary | ICD-10-CM | POA: Diagnosis not present

## 2023-10-22 ENCOUNTER — Encounter: Payer: Self-pay | Admitting: Pulmonary Disease

## 2023-10-22 ENCOUNTER — Ambulatory Visit: Payer: Medicare Other | Admitting: Pulmonary Disease

## 2023-10-22 VITALS — BP 138/84 | HR 95 | Ht 64.0 in | Wt 154.0 lb

## 2023-10-22 DIAGNOSIS — Z87891 Personal history of nicotine dependence: Secondary | ICD-10-CM | POA: Diagnosis not present

## 2023-10-22 DIAGNOSIS — R0602 Shortness of breath: Secondary | ICD-10-CM | POA: Diagnosis not present

## 2023-10-22 DIAGNOSIS — R0600 Dyspnea, unspecified: Secondary | ICD-10-CM

## 2023-10-22 NOTE — Progress Notes (Signed)
 Brandi Mahoney    [id number]    08/09/1955  Primary Care Physician:Copland, Jolena Nay, MD  Referring Physician: Scherrie Curt, MD [address] [place],  [place] [address]  Chief complaint: Consult for dyspnea  HPI: 68 y.o. who  has a past medical history of CAD (coronary artery disease), GAD (generalized anxiety disorder), History of kidney stones, HLD (hyperlipidemia), Hypothyroidism, Major depressive disorder, recurrent episode, in full remission (HCC) (09/08/2008), Migraine headache, Myocarditis (HCC), Sleep apnea, and Type 2 diabetes, diet controlled (HCC).  Discussed the use of AI scribe software for clinical note transcription with the patient, who gave verbal consent to proceed.  History of Present Illness Brandi Mahoney "Brandi Mahoney" is a 68 year old female with unstable angina and stent placement who presents with chronic shortness of breath and a persistent dry cough. She was referred by Dr. Allison Arena for evaluation of her chronic shortness of breath and persistent dry cough.  She has experienced chronic shortness of breath for many years, which worsens with physical activity. The shortness of breath has remained stable over time and did not significantly improve following her recent stent placement. She also has a persistent dry cough that has lasted over a year.   She underwent hospitalization in March 2025 for angina and a heart catheterization and stent placement after experiencing chest pain and was found to have an 85% blockage in one of her arteries. A CT scan was performed at that time to rule out blood clots, which showed no clots but indicated mild changes at the base of her lungs, possibly scarring or atelectasis.  Her past medical history includes viral myocarditis in 2011, which resolved without lasting effects. She had pneumonia as a child and has a functional heart murmur since childhood.  Pets: Dog Occupation: Retired Print production planner in a [employer] Exposures: No mold, hot tub, Financial controller.  No feather pillows or comforter Smoking history: 12-pack-year smoker.  Quit around 2005 Travel history: Originally from [place] .  Moved to [place]  around 2010.  No significant recent travel Relevant family history: No family history of lung disease   Outpatient Encounter Medications as of 10/22/2023  Medication Sig   Accu-Chek Softclix Lancets lancets Use to check blood sugar daily   albuterol  (VENTOLIN  HFA) 108 (90 Base) MCG/ACT inhaler Inhale 2 puffs into the lungs every 6 (six) hours as needed for wheezing or shortness of breath.   aspirin  EC 81 MG tablet Take 81 mg by mouth daily.   cefdinir  (OMNICEF ) 300 MG capsule Take 2 capsules (600 mg total) by mouth daily.   clobetasol  ointment (TEMOVATE ) 0.05 % Apply 1 Application topically daily as needed (skin conditions).   fenofibrate  160 MG tablet Take 1 tablet (160 mg total) by mouth daily.   gabapentin  (NEURONTIN ) 300 MG capsule Take 300 mg by mouth at bedtime.   glucose blood (ACCU-CHEK GUIDE) test strip Use to check blood sugar daily   HYDROcodone  bit-homatropine (HYCODAN) 5-1.5 MG/5ML syrup Take 5 mLs by mouth at bedtime as needed for cough.   Lactobacillus-Inulin (CULTURELLE ADULT ULT BALANCE PO) Take 1 tablet by mouth daily.   levothyroxine  (SYNTHROID ) 75 MCG tablet TAKE ONE TABLET BY MOUTH EVERY MORNING BEFORE BREAKFAST   Magnesium  250 MG TABS Take 250 mg by mouth daily.   metFORMIN  (GLUCOPHAGE -XR) 500 MG 24 hr tablet Take 1,000 mg by mouth 2 (two) times daily with a meal.   metoprolol  succinate (TOPROL -XL) 50 MG 24 hr tablet Take  1 tablet (50 mg total) by mouth daily. Take with or immediately following a meal.   Multiple Vitamins-Minerals (ONE-A-DAY WOMENS PETITES PO) Take 1 tablet by mouth daily.   nitroGLYCERIN  (NITROSTAT ) 0.4 MG SL tablet Place 1 tablet under tongue at onset of chest pain; you may repeat every 5 minutes for up to 3 doses.   pantoprazole  (PROTONIX ) 40 MG tablet Take  1 tablet (40 mg total) by mouth daily.   prasugrel  (EFFIENT ) 10 MG TABS tablet Take 1 tablet (10 mg total) by mouth daily.   rosuvastatin  (CRESTOR ) 40 MG tablet Take 1 tablet (40 mg total) by mouth daily.   sertraline  (ZOLOFT ) 50 MG tablet TAKE 1 TABLET BY MOUTH DAILY   traZODone  (DESYREL ) 100 MG tablet Take 1 tablet (100 mg total) by mouth at bedtime.   No facility-administered encounter medications on file as of 10/22/2023.    Allergies as of 10/22/2023 - Review Complete 10/22/2023  Allergen Reaction Noted   Cat dander Shortness Of Breath 09/25/2023   Pseudoeph-doxylamine-dm-apap Anaphylaxis 09/08/2008   Gluten Other (See Comments)    Penicillins Itching and Rash 09/08/2008    Past Medical History:  Diagnosis Date   CAD (coronary artery disease)    a. 01/2010 Cath: mild atherosclerosis in the mid LCX after takeoff of OM1, mild diff LAD dzs;  b. 07/2013 MV: EF 67%, no ischemia.   GAD (generalized anxiety disorder)    History of kidney stones    HLD (hyperlipidemia)    Hypothyroidism    Major depressive disorder, recurrent episode, in full remission (HCC) 09/08/2008   Qualifier: Diagnosis of  By: Geralyn Knee MD, Spencer     Migraine headache    Myocarditis (HCC)    a. 01/2010 presented w/ c/p and elev trop-->Cath: mild atherosclerosis in the mid LCX after takeoff of OM1, mild diff LAD dzs;  b. 03/2010 Echo: EF 60-65%, DD, mild LVH;  c. 07/2011 Echo: EF 55-60%, mild LVH, no rwma.   Sleep apnea    has c-pap.  wears off and on - not lately  using  per reported 07-29-2020   Type 2 diabetes, diet controlled [facility])     Past Surgical History:  Procedure Laterality Date   APPENDECTOMY  1974   BREAST SURGERY Right 1988   fatty tumore from breast   CARDIAC CATHETERIZATION  01/2010   no significant -[facility]- Dr Jerelene Monday,   CHOLECYSTECTOMY  2002   COLONOSCOPY  2017   CORONARY STENT INTERVENTION N/A 09/25/2023   Procedure: CORONARY STENT INTERVENTION;  Surgeon: Swaziland, Peter M, MD;  Location: [facility]  INVASIVE CV LAB;  Service: Cardiovascular;  Laterality: N/A;   CYST EXCISION     ovarian cyst 1994   INSERTION OF ARTERIOVENOUS (AV) ARTEGRAFT ARM Right 09/27/2023   Procedure: 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;  Surgeon: Philipp Brawn, MD;  Location: [facility] OR;  Service: Vascular;  Laterality: Right;   LEFT HEART CATH AND CORONARY ANGIOGRAPHY N/A 09/25/2023   Procedure: LEFT HEART CATH AND CORONARY ANGIOGRAPHY;  Surgeon: Swaziland, Peter M, MD;  Location: [facility] INVASIVE CV LAB;  Service: Cardiovascular;  Laterality: N/A;   NEPHROLITHOTOMY Right 08/20/2014   Procedure: RIGHT PERCUTANEOUS NEPHROLITHOTOMY ;  Surgeon: Willye Harvey, MD;  Location: [facility] ORS;  Service: Urology;  Laterality: Right;   POLYPECTOMY     RECTAL EXAM UNDER ANESTHESIA N/A 08/02/2020   Procedure: Wilkie Harding EXAM UNDER ANESTHESIA INJECTION OF PERIANAL BOTOX ;  Surgeon: Melvenia Stabs, MD;  Location: [facility] [place];  Service: General;  Laterality: N/A;   TOTAL ABDOMINAL HYSTERECTOMY W/ BILATERAL SALPINGOOPHORECTOMY  2000   no ovaries, uterus, or cervix.    Family History  Problem Relation Age of Onset   Coronary artery disease Mother    Diabetes Mother    ALS Mother    Hyperlipidemia Father    Breast cancer Sister 49   Stomach cancer Maternal Grandfather    Colon cancer Maternal Grandfather        dx early 59's    Breast cancer Paternal Grandmother    Lung cancer Paternal Grandfather        CAD   Other Daughter        gluten   Colon cancer Son 37       3rd stage   Down syndrome Son    Esophageal cancer Neg Hx    Liver cancer Neg Hx    Pancreatic cancer Neg Hx    Rectal cancer Neg Hx     Social History   Socioeconomic History   Marital status: Married    Spouse name: Baker Bon   Number of children: 3   Years of education: Not on file   Highest education level: Not on file  Occupational History   Occupation: retired    Comment: [employer]  Tobacco Use   Smoking status: Former     Current packs/day: 0.00    Average packs/day: 0.4 packs/day for 35.0 years (12.3 ttl pk-yrs)    Types: Cigarettes    Start date: 09/25/1973    Quit date: 09/25/2008    Years since quitting: 15.0   Smokeless tobacco: Never   Tobacco comments:    quit in 2007-2008, social smoker  Vaping Use   Vaping status: Never Used  Substance and Sexual Activity   Alcohol use: Not Currently   Drug use: No   Sexual activity: Not Currently  Other Topics Concern   Not on file  Social History Narrative   Lives in [place] w/ husband.  No reg exercise.   Social Drivers of Corporate investment banker Strain: Not on file  Food Insecurity: No Food Insecurity (09/26/2023)   Hunger Vital Sign    Worried About Running Out of Food in the Last Year: Never true    Ran Out of Food in the Last Year: Never true  Transportation Needs: No Transportation Needs (09/26/2023)   PRAPARE - Administrator, Civil Service (Medical): No    Lack of Transportation (Non-Medical): No  Physical Activity: Not on file  Stress: Not on file  Social Connections: Socially Integrated (09/26/2023)   Social Connection and Isolation Panel [NHANES]    Frequency of Communication with Friends and Family: More than three times a week    Frequency of Social Gatherings with Friends and Family: Once a week    Attends Religious Services: More than 4 times per year    Active Member of [employer] or Organizations: Yes    Attends Engineer, structural: More than 4 times per year    Marital Status: Married  Catering manager Violence: Not At Risk (09/26/2023)   Humiliation, Afraid, Rape, and Kick questionnaire    Fear of Current or Ex-Partner: No    Emotionally Abused: No    Physically Abused: No    Sexually Abused: No    Review of systems: Review of Systems  Constitutional: Negative for fever and chills.  HENT: Negative.   Eyes: Negative for blurred vision.  Respiratory: as per HPI  Cardiovascular: Negative for chest  pain and  palpitations.  Gastrointestinal: Negative for vomiting, diarrhea, blood per rectum. Genitourinary: Negative for dysuria, urgency, frequency and hematuria.  Musculoskeletal: Negative for myalgias, back pain and joint pain.  Skin: Negative for itching and rash.  Neurological: Negative for dizziness, tremors, focal weakness, seizures and loss of consciousness.  Endo/Heme/Allergies: Negative for environmental allergies.  Psychiatric/Behavioral: Negative for depression, suicidal ideas and hallucinations.  All other systems reviewed and are negative.  Physical Exam: Blood pressure 138/84, pulse 95, height 5\' 4"  (1.626 m), weight 154 lb (69.9 kg), SpO2 97%. Gen:      No acute distress HEENT:  EOMI, sclera anicteric Neck:     No masses; no thyromegaly Lungs:    Clear to auscultation bilaterally; normal respiratory effort CV:         Regular rate and rhythm; no murmurs Abd:      + bowel sounds; soft, non-tender; no palpable masses, no distension Ext:    No edema; adequate peripheral perfusion Skin:      Warm and dry; no rash Neuro: alert and oriented x 3 Psych: normal mood and affect  Data Reviewed: Imaging: CTA 09/17/2023 No pulmonary embolism, mild bibasilar opacities representing linear scarring or atelectasis I have reviewed the images personally  PFTs:  Labs:  Assessment and Plan Assessment & Plan Chronic shortness of breath Chronic dyspnea for years, well-managed. CT scan reveals mild changes at lung bases, possibly scarring or atelectasis. Differential includes mild lung changes contributing to symptoms. No acute findings on recent CT scan for clots. Lungs auscultated clear. - Order high-resolution CT of the chest to evaluate lung changes. - Schedule lung function tests in three months. - Follow up in three months to review test results.  Unstable angina Recent stent placement due to 85% arterial blockage. No current angina reported. Managed with cardiology  intervention.  Viral myocarditis Viral myocarditis in 2011, resolved without current cardiac symptoms. No recent recurrence.  Smoking cessation Social smoking ceased approximately 20 years ago. No current smoking-related symptoms or exposures.  Recommendations: High-res CT, PFTs  Phyllis Breeze MD [facility] 10/22/2023, 2:15 PM  CC: Scherrie Curt, MD

## 2023-10-22 NOTE — Patient Instructions (Signed)
 VISIT SUMMARY:  You came in today for an evaluation of your chronic shortness of breath and persistent dry cough. We discussed your history of unstable angina and recent stent placement, as well as your past medical history including viral myocarditis and smoking cessation.  YOUR PLAN:  -CHRONIC SHORTNESS OF BREATH: Chronic shortness of breath means you have had difficulty breathing for a long time, which worsens with physical activity. Your recent CT scan showed mild changes at the base of your lungs, possibly scarring or atelectasis. We will order a high-resolution CT of your chest to get a clearer picture of these lung changes and schedule lung function tests in three months. Please follow up in three months to review the test results.  -UNSTABLE ANGINA: Unstable angina is a condition where the heart does not get enough blood flow and oxygen, leading to chest pain. You recently had a stent placed due to an 85% blockage in one of your arteries. Currently, you are not experiencing any angina symptoms, and it is being managed with cardiology intervention.  -VIRAL MYOCARDITIS: Viral myocarditis is an inflammation of the heart muscle caused by a viral infection. You had this condition in 2011, but it has resolved without any current symptoms.  -SMOKING CESSATION: Smoking cessation means you have successfully quit smoking. You stopped smoking about 20 years ago and do not have any current symptoms or exposures related to smoking.  INSTRUCTIONS:  Please follow up in three months to review the results of your high-resolution CT scan and lung function tests.

## 2023-10-23 ENCOUNTER — Ambulatory Visit (HOSPITAL_COMMUNITY): Attending: Cardiovascular Disease

## 2023-10-23 ENCOUNTER — Encounter: Attending: Cardiovascular Disease | Admitting: *Deleted

## 2023-10-23 DIAGNOSIS — K9041 Non-celiac gluten sensitivity: Secondary | ICD-10-CM | POA: Insufficient documentation

## 2023-10-23 DIAGNOSIS — Z713 Dietary counseling and surveillance: Secondary | ICD-10-CM | POA: Insufficient documentation

## 2023-10-23 DIAGNOSIS — Z955 Presence of coronary angioplasty implant and graft: Secondary | ICD-10-CM

## 2023-10-23 DIAGNOSIS — E119 Type 2 diabetes mellitus without complications: Secondary | ICD-10-CM | POA: Insufficient documentation

## 2023-10-23 DIAGNOSIS — Z48812 Encounter for surgical aftercare following surgery on the circulatory system: Secondary | ICD-10-CM | POA: Insufficient documentation

## 2023-10-23 DIAGNOSIS — I25110 Atherosclerotic heart disease of native coronary artery with unstable angina pectoris: Secondary | ICD-10-CM | POA: Diagnosis not present

## 2023-10-23 LAB — ECHOCARDIOGRAM COMPLETE
Area-P 1/2: 3.82 cm2
MV M vel: 5.19 m/s
MV Peak grad: 107.7 mmHg
S' Lateral: 3 cm

## 2023-10-23 NOTE — Progress Notes (Signed)
 Initial phone call completed. Diagnosis can be found in [facility] 4/8. EP Orientation scheduled for Wednesday 5/7 9:30.

## 2023-10-23 NOTE — Progress Notes (Deleted)
 Cardiology Office Note    Date:  10/23/2023   ID:  Brandi Mahoney, DOB 11-21-1955, MRN [id number]  PCP:  Scherrie Curt, MD  Cardiologist:  Belva Boyden, MD  Electrophysiologist:  None   Chief Complaint: Follow up  History of Present Illness:   Brandi Mahoney is a 68 y.o. female with history of ***  ***   Labs independently reviewed: 09/2023 - LP(a) 20.6, Hgb 11.7, PLT 277, potassium 3.6, BUN 13, serum creatinine 0.79 07/2023 - A1c 6.4 11/2022 - direct LDL 62, TC 124, TG 250, HDL 40, albumin  4.6, AST 42, ALT 44, TSH normal  Past Medical History:  Diagnosis Date   CAD (coronary artery disease)    a. 01/2010 Cath: mild atherosclerosis in the mid LCX after takeoff of OM1, mild diff LAD dzs;  b. 07/2013 MV: EF 67%, no ischemia.   GAD (generalized anxiety disorder)    History of kidney stones    HLD (hyperlipidemia)    Hypothyroidism    Major depressive disorder, recurrent episode, in full remission (HCC) 09/08/2008   Qualifier: Diagnosis of  By: Geralyn Knee MD, [place]     Migraine headache    Myocarditis (HCC)    a. 01/2010 presented w/ c/p and elev trop-->Cath: mild atherosclerosis in the mid LCX after takeoff of OM1, mild diff LAD dzs;  b. 03/2010 Echo: EF 60-65%, DD, mild LVH;  c. 07/2011 Echo: EF 55-60%, mild LVH, no rwma.   Sleep apnea    has c-pap.  wears off and on - not lately  using  per reported 07-29-2020   Type 2 diabetes, diet controlled [facility])     Past Surgical History:  Procedure Laterality Date   APPENDECTOMY  1974   BREAST SURGERY Right 1988   fatty tumore from breast   CARDIAC CATHETERIZATION  01/2010   [facility] Dr Jerelene Monday,   CHOLECYSTECTOMY  2002   COLONOSCOPY  2017   CORONARY STENT INTERVENTION N/A 09/25/2023   Procedure: CORONARY STENT INTERVENTION;  Surgeon: Swaziland, Peter M, MD;  Location: [facility] INVASIVE CV LAB;  Service: Cardiovascular;  Laterality: N/A;   CYST EXCISION     ovarian cyst 1994   INSERTION OF ARTERIOVENOUS (AV) ARTEGRAFT ARM  Right 09/27/2023   Procedure: 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;  Surgeon: Philipp Brawn, MD;  Location: [facility] OR;  Service: Vascular;  Laterality: Right;   LEFT HEART CATH AND CORONARY ANGIOGRAPHY N/A 09/25/2023   Procedure: LEFT HEART CATH AND CORONARY ANGIOGRAPHY;  Surgeon: Swaziland, Peter M, MD;  Location: [facility] INVASIVE CV LAB;  Service: Cardiovascular;  Laterality: N/A;   NEPHROLITHOTOMY Right 08/20/2014   Procedure: RIGHT PERCUTANEOUS NEPHROLITHOTOMY ;  Surgeon: Willye Harvey, MD;  Location: [facility] ORS;  Service: Urology;  Laterality: Right;   POLYPECTOMY     RECTAL EXAM UNDER ANESTHESIA N/A 08/02/2020   Procedure: Wilkie Harding EXAM UNDER ANESTHESIA INJECTION OF PERIANAL BOTOX ;  Surgeon: Melvenia Stabs, MD;  Location: [facility];  Service: General;  Laterality: N/A;   TOTAL ABDOMINAL HYSTERECTOMY W/ BILATERAL SALPINGOOPHORECTOMY  2000   no ovaries, uterus, or cervix.    Current Medications: No outpatient medications have been marked as taking for the 10/24/23 encounter (Appointment) with Roark Chick, PA-C.    Allergies:   Cat dander, Pseudoeph-doxylamine-dm-apap, Gluten, and Penicillins   Social History   Socioeconomic History   Marital status: Married    Spouse name: Baker Bon   Number of children: 3   Years of education: Not on  file   Highest education level: Not on file  Occupational History   Occupation: retired    Comment: [employer]  Tobacco Use   Smoking status: Former    Current packs/day: 0.00    Average packs/day: 0.4 packs/day for 35.0 years (12.3 ttl pk-yrs)    Types: Cigarettes    Start date: 09/25/1973    Quit date: 09/25/2008    Years since quitting: 15.0   Smokeless tobacco: Never   Tobacco comments:    quit in 2007-2008, social smoker  Vaping Use   Vaping status: Never Used  Substance and Sexual Activity   Alcohol use: Not Currently   Drug use: No   Sexual activity: Not Currently  Other Topics Concern   Not on file  Social History  Narrative   Lives in [place] w/ husband.  No reg exercise.   Social Drivers of Corporate investment banker Strain: Not on file  Food Insecurity: No Food Insecurity (09/26/2023)   Hunger Vital Sign    Worried About Running Out of Food in the Last Year: Never true    Ran Out of Food in the Last Year: Never true  Transportation Needs: No Transportation Needs (09/26/2023)   PRAPARE - Administrator, Civil Service (Medical): No    Lack of Transportation (Non-Medical): No  Physical Activity: Not on file  Stress: Not on file  Social Connections: Socially Integrated (09/26/2023)   Social Connection and Isolation Panel [NHANES]    Frequency of Communication with Friends and Family: More than three times a week    Frequency of Social Gatherings with Friends and Family: Once a week    Attends Religious Services: More than 4 times per year    Active Member of [employer] or Organizations: Yes    Attends Engineer, structural: More than 4 times per year    Marital Status: Married     Family History:  The patient's family history includes ALS in her mother; Breast cancer in her paternal grandmother; Breast cancer (age of onset: 61) in her sister; Colon cancer in her maternal grandfather; Colon cancer (age of onset: 30) in her son; Coronary artery disease in her mother; Diabetes in her mother; Down syndrome in her son; Hyperlipidemia in her father; Lung cancer in her paternal grandfather; Other in her daughter; Stomach cancer in her maternal grandfather. There is no history of Esophageal cancer, Liver cancer, Pancreatic cancer, or Rectal cancer.  ROS:   12-point review of systems is negative unless otherwise noted in the HPI.   EKGs/Labs/Other Studies Reviewed:    Studies reviewed were summarized above. The additional studies were reviewed today:  LHC 09/25/2023: Single vessel obstructive CAD involving the proximal LAD Normal LV function Normal LVEDP Successful PCI of the proximal  LAD with DES x 1   Plan: DAPT for one year. Anticipate same day DC   Diagnostic Dominance: Right Left Main  Vessel was injected. Vessel is normal in caliber. Vessel is angiographically normal.    Left Anterior Descending  Prox LAD lesion is 85% stenosed. The lesion is focal.    Left Circumflex  Vessel was injected. Vessel is normal in caliber. The vessel exhibits minimal luminal irregularities.    Right Coronary Artery  Vessel was injected. Vessel is large. Vessel is angiographically normal.    Intervention   Prox LAD lesion  Stent  CATH VISTA GUIDE 6FR XBLD 3.5 guide catheter was inserted. Lesion crossed with guidewire using a WIRE ASAHI PROWATER 180CM. Pre-stent  angioplasty was performed using a BALLN EMERGE MR 2.5X12. A drug-eluting stent was successfully placed using a SYNERGY XD 3.50X12. Maximum pressure: 12 atm. Stent strut is well apposed. Post-stent angioplasty was not performed.  Post-Intervention Lesion Assessment  The intervention was successful. Pre-interventional TIMI flow is 3. Post-intervention TIMI flow is 3. No complications occurred at this lesion.  There is a 0% residual stenosis post intervention.    __________  Lexiscan  MPI 11/08/2018: Pharmacological myocardial perfusion imaging study with no significant  Ischemia Breast attenuation artifact noted Normal wall motion, EF could not be estimated secondary to technical issues No EKG changes concerning for ischemia at peak stress or in recovery. Low risk scan __________  2D echo 10/05/2017: - Left ventricle: The cavity size was normal. There was mild    concentric hypertrophy. Systolic function was vigorous. The    estimated ejection fraction was in the range of 65% to 70%. Wall    motion was normal; there were no regional wall motion    abnormalities. Doppler parameters are consistent with abnormal    left ventricular relaxation (grade 1 diastolic dysfunction).  __________  Lexiscan  MPI  06/23/2016: Pharmacological myocardial perfusion imaging study with no significant  ischemia Normal wall motion, EF estimated at 69% No EKG changes concerning for ischemia at peak stress or in recovery. Low risk scan __________  See CV studies in Epic for more remote imaging    EKG:  EKG is ordered today.  The EKG ordered today demonstrates ***  Recent Labs: 11/22/2022: ALT 44; TSH 1.86 09/26/2023: BUN 13; Creatinine, Ser 0.79; Hemoglobin 11.7; Platelets 277; Potassium 3.6; Sodium 138  Recent Lipid Panel    Component Value Date/Time   CHOL 124 11/22/2022 1558   CHOL 151 10/17/2011 0826   TRIG 250.0 (H) 11/22/2022 1558   TRIG 136 10/17/2011 0826   HDL 40.10 11/22/2022 1558   HDL 44 10/17/2011 0826   CHOLHDL 3 11/22/2022 1558   VLDL 50.0 (H) 11/22/2022 1558   VLDL 27 10/17/2011 0826   LDLCALC 56 09/28/2021 1034   LDLCALC 80 10/17/2011 0826   LDLDIRECT 62.0 11/22/2022 1558    PHYSICAL EXAM:    VS:  There were no vitals taken for this visit.  BMI: There is no height or weight on file to calculate BMI.  Physical Exam  Wt Readings from Last 3 Encounters:  10/22/23 154 lb (69.9 kg)  10/19/23 152 lb (68.9 kg)  10/08/23 151 lb 8 oz (68.7 kg)     ASSESSMENT & PLAN:   CAD involving the native coronary arteries with ***    {Are you ordering a CV Procedure (e.g. stress test, cath, DCCV, TEE, etc)?   Press F2        :[id number]}     Disposition: F/u with Dr. Gollan or an APP in ***.   Medication Adjustments/Labs and Tests Ordered: Current medicines are reviewed at length with the patient today.  Concerns regarding medicines are outlined above. Medication changes, Labs and Tests ordered today are summarized above and listed in the Patient Instructions accessible in Encounters.   Signed, Varney Gentleman, PA-C 10/23/2023 9:34 AM     [facility] [place] [address] [place], [place] [address] [phone]

## 2023-10-24 ENCOUNTER — Ambulatory Visit: Admitting: Physician Assistant

## 2023-10-24 ENCOUNTER — Encounter

## 2023-10-24 VITALS — Ht 64.5 in | Wt 154.9 lb

## 2023-10-24 DIAGNOSIS — Z955 Presence of coronary angioplasty implant and graft: Secondary | ICD-10-CM | POA: Diagnosis not present

## 2023-10-24 DIAGNOSIS — Z48812 Encounter for surgical aftercare following surgery on the circulatory system: Secondary | ICD-10-CM | POA: Diagnosis not present

## 2023-10-24 DIAGNOSIS — E119 Type 2 diabetes mellitus without complications: Secondary | ICD-10-CM | POA: Diagnosis not present

## 2023-10-24 DIAGNOSIS — K9041 Non-celiac gluten sensitivity: Secondary | ICD-10-CM | POA: Diagnosis not present

## 2023-10-24 DIAGNOSIS — Z713 Dietary counseling and surveillance: Secondary | ICD-10-CM | POA: Diagnosis not present

## 2023-10-24 NOTE — Progress Notes (Signed)
 Assessment start time: 10:43 AM  Digestive issues/concerns: gluten intolerance   24-hours Recall: B: bagel, scrambled egg OR cereal OR oatmeal Snack: grapes OR banana L: ham or [place] sandwich with salad OR leftover Snack: fruit or cookie D: chicken or porkchop, rice or potatos with veggies or salad  Beverages water (50-75oz), ice tea (sweetened but much less sugar than others) Alcohol rarely Caffeine  coffee  Education r/t nutrition plan Patient drinking ~50-75oz of water. She eats 3 meals per day, sometimes will have fruit as a snack between meals. She has DM and knows to monitor her carb intake. Reviewed goal of ~30-60g carbs per meal. Encouraged her to pair her carbs with protein. She has been watching her salt intake trying to stay below 2300mg . Provided her a guideline of less than 1500mg  per day. Reviewed mediterranean diet handout. Educated on types of fats, sources, and how to read labels. Reviewed several facts labels together. She is knowledgeable of foods and macros. She knows what to do and has followed a heart healthy diet years ago. She wants to set goal to be more strict on sodium and saturated fats, and stay consistent with her healthy eating.    Goal 1: Read labels and reduce sodium intake to below 2300mg . Ideally 1500mg  per day.  Goal 2: Eat 15-30gProtein and 30-60gCarbs at each meal. Goal 3: Reduce saturated fat, less than 12g per day. Replace bad fats for more heart healthy fats.   End time 11:43 AM

## 2023-10-24 NOTE — Patient Instructions (Signed)
 Patient Instructions  Patient Details  Name: Brandi Mahoney MRN: [id number] Date of Birth: Oct 16, 1955 Referring Provider:  Devorah Fonder, MD  Below are your personal goals for exercise, nutrition, and risk factors. Our goal is to help you stay on track towards obtaining and maintaining these goals. We will be discussing your progress on these goals with you throughout the program.  Initial Exercise Prescription:  Initial Exercise Prescription - 10/24/23 1100       Date of Initial Exercise RX and Referring Provider   Date 10/24/23    Referring Provider Dr. Timothy Gollan, MD      Oxygen   Maintain Oxygen Saturation 88% or higher      Treadmill   MPH 2.3    Grade 1    Minutes 15    METs 3.08      NuStep   Level 2    SPM 80    Minutes 15    METs 2.96      REL-XR   Level 2    Speed 50    Minutes 15    METs 2.96      Prescription Details   Frequency (times per week) 3    Duration Progress to 30 minutes of continuous aerobic without signs/symptoms of physical distress      Intensity   THRR 40-80% of Max Heartrate 108-138    Ratings of Perceived Exertion 11-13    Perceived Dyspnea 0-4      Progression   Progression Continue to progress workloads to maintain intensity without signs/symptoms of physical distress.      Resistance Training   Training Prescription Yes    Weight 4 lb    Reps 10-15             Exercise Goals: Frequency: Be able to perform aerobic exercise two to three times per week in program working toward 2-5 days per week of home exercise.  Intensity: Work with a perceived exertion of 11 (fairly light) - 15 (hard) while following your exercise prescription.  We will make changes to your prescription with you as you progress through the program.   Duration: Be able to do 30 to 45 minutes of continuous aerobic exercise in addition to a 5 minute warm-up and a 5 minute cool-down routine.   Nutrition Goals: Your personal nutrition goals will  be established when you do your nutrition analysis with the dietician.  The following are general nutrition guidelines to follow: Cholesterol < 200mg /day Sodium < 1500mg /day Fiber: Women over 50 yrs - 21 grams per day  Personal Goals:  Personal Goals and Risk Factors at Admission - 10/23/23 1305       Core Components/Risk Factors/Patient Goals on Admission    Weight Management Yes;Weight Loss    Intervention Weight Management: Develop a combined nutrition and exercise program designed to reach desired caloric intake, while maintaining appropriate intake of nutrient and fiber, sodium and fats, and appropriate energy expenditure required for the weight goal.;Weight Management: Provide education and appropriate resources to help participant work on and attain dietary goals.;Weight Management/Obesity: Establish reasonable short term and long term weight goals.    Goal Weight: Long Term 140 lb (63.5 kg)    Expected Outcomes Short Term: Continue to assess and modify interventions until short term weight is achieved;Long Term: Adherence to nutrition and physical activity/exercise program aimed toward attainment of established weight goal;Weight Loss: Understanding of general recommendations for a balanced deficit meal plan, which promotes 1-2 lb weight loss per  week and includes a negative energy balance of [phone] kcal/d;Understanding recommendations for meals to include 15-35% energy as protein, 25-35% energy from fat, 35-60% energy from carbohydrates, less than 200mg  of dietary cholesterol, 20-35 gm of total fiber daily;Understanding of distribution of calorie intake throughout the day with the consumption of 4-5 meals/snacks    Diabetes Yes    Intervention Provide education about signs/symptoms and action to take for hypo/hyperglycemia.;Provide education about proper nutrition, including hydration, and aerobic/resistive exercise prescription along with prescribed medications to achieve blood glucose  in normal ranges: Fasting glucose 65-99 mg/dL    Expected Outcomes Short Term: Participant verbalizes understanding of the signs/symptoms and immediate care of hyper/hypoglycemia, proper foot care and importance of medication, aerobic/resistive exercise and nutrition plan for blood glucose control.;Long Term: Attainment of HbA1C < 7%.    Lipids Yes    Intervention Provide education and support for participant on nutrition & aerobic/resistive exercise along with prescribed medications to achieve LDL 70mg , HDL >40mg .    Expected Outcomes Short Term: Participant states understanding of desired cholesterol values and is compliant with medications prescribed. Participant is following exercise prescription and nutrition guidelines.;Long Term: Cholesterol controlled with medications as prescribed, with individualized exercise RX and with personalized nutrition plan. Value goals: LDL < 70mg , HDL > 40 mg.            Exercise Goals and Review:  Exercise Goals     Row Name 10/24/23 1128             Exercise Goals   Increase Physical Activity Yes       Intervention Provide advice, education, support and counseling about physical activity/exercise needs.;Develop an individualized exercise prescription for aerobic and resistive training based on initial evaluation findings, risk stratification, comorbidities and participant's personal goals.       Expected Outcomes Short Term: Attend rehab on a regular basis to increase amount of physical activity.;Long Term: Add in home exercise to make exercise part of routine and to increase amount of physical activity.;Long Term: Exercising regularly at least 3-5 days a week.       Increase Strength and Stamina Yes       Intervention Provide advice, education, support and counseling about physical activity/exercise needs.;Develop an individualized exercise prescription for aerobic and resistive training based on initial evaluation findings, risk stratification,  comorbidities and participant's personal goals.       Expected Outcomes Short Term: Increase workloads from initial exercise prescription for resistance, speed, and METs.;Short Term: Perform resistance training exercises routinely during rehab and add in resistance training at home;Long Term: Improve cardiorespiratory fitness, muscular endurance and strength as measured by increased METs and functional capacity ( )       Able to understand and use rate of perceived exertion (RPE) scale Yes       Intervention Provide education and explanation on how to use RPE scale       Expected Outcomes Short Term: Able to use RPE daily in rehab to express subjective intensity level;Long Term:  Able to use RPE to guide intensity level when exercising independently       Able to understand and use Dyspnea scale Yes       Intervention Provide education and explanation on how to use Dyspnea scale       Expected Outcomes Short Term: Able to use Dyspnea scale daily in rehab to express subjective sense of shortness of breath during exertion;Long Term: Able to use Dyspnea scale to guide intensity level when exercising independently  Knowledge and understanding of Target Heart Rate Range (THRR) Yes       Intervention Provide education and explanation of THRR including how the numbers were predicted and where they are located for reference       Expected Outcomes Short Term: Able to state/look up THRR;Long Term: Able to use THRR to govern intensity when exercising independently;Short Term: Able to use daily as guideline for intensity in rehab       Able to check pulse independently Yes       Intervention Provide education and demonstration on how to check pulse in carotid and radial arteries.;Review the importance of being able to check your own pulse for safety during independent exercise       Expected Outcomes Short Term: Able to explain why pulse checking is important during independent exercise;Long Term: Able to  check pulse independently and accurately       Understanding of Exercise Prescription Yes       Intervention Provide education, explanation, and written materials on patient's individual exercise prescription       Expected Outcomes Short Term: Able to explain program exercise prescription;Long Term: Able to explain home exercise prescription to exercise independently

## 2023-10-25 ENCOUNTER — Other Ambulatory Visit: Payer: Self-pay

## 2023-10-25 ENCOUNTER — Ambulatory Visit: Admitting: Physician Assistant

## 2023-10-25 DIAGNOSIS — I729 Aneurysm of unspecified site: Secondary | ICD-10-CM

## 2023-10-29 ENCOUNTER — Encounter: Admitting: *Deleted

## 2023-10-29 DIAGNOSIS — K9041 Non-celiac gluten sensitivity: Secondary | ICD-10-CM | POA: Diagnosis not present

## 2023-10-29 DIAGNOSIS — Z48812 Encounter for surgical aftercare following surgery on the circulatory system: Secondary | ICD-10-CM | POA: Diagnosis not present

## 2023-10-29 DIAGNOSIS — Z713 Dietary counseling and surveillance: Secondary | ICD-10-CM | POA: Diagnosis not present

## 2023-10-29 DIAGNOSIS — Z955 Presence of coronary angioplasty implant and graft: Secondary | ICD-10-CM

## 2023-10-29 DIAGNOSIS — E119 Type 2 diabetes mellitus without complications: Secondary | ICD-10-CM | POA: Diagnosis not present

## 2023-10-29 LAB — GLUCOSE, CAPILLARY
Glucose-Capillary: 186 mg/dL — ABNORMAL HIGH (ref 70–99)
Glucose-Capillary: 147 mg/dL — ABNORMAL HIGH (ref 70–99)

## 2023-10-29 NOTE — Progress Notes (Signed)
 Daily Session Note  Patient Details  Name: Brandi Mahoney MRN: [id number] Date of Birth: 05/10/1956 Referring Provider:   Flowsheet Row Cardiac Rehab from 10/24/2023 in [facility] Cardiac and Pulmonary Rehab  Referring Provider Dr. Belva Boyden, MD       Encounter Date: 10/29/2023  Check In:  Session Check In - 10/29/23 0953       Check-In   Supervising physician immediately available to respond to emergencies See telemetry face sheet for immediately available ER MD    Location [facility]-Cardiac & Pulmonary Rehab    Staff Present Maud Sorenson, RN, BSN, CCRP;Kelly Hayes BS, ACSM CEP, Exercise Physiologist;Maxon Conetta BS, Exercise Physiologist;Joseph Hood RCP,RRT,BSRT    Virtual Visit No    Medication changes reported     No    Fall or balance concerns reported    No    Warm-up and Cool-down Performed on first and last piece of equipment    Resistance Training Performed Yes    VAD Patient? No    PAD/SET Patient? No      Pain Assessment   Currently in Pain? No/denies                Social History   Tobacco Use  Smoking Status Former   Current packs/day: 0.00   Average packs/day: 0.4 packs/day for 35.0 years (12.3 ttl pk-yrs)   Types: Cigarettes   Start date: 09/25/1973   Quit date: 09/25/2008   Years since quitting: 15.1  Smokeless Tobacco Never  Tobacco Comments   quit in 2007-2008, social smoker    Goals Met:  Exercise tolerated well Personal goals reviewed No report of concerns or symptoms today  Goals Unmet:  Not Applicable  Comments:  Pt able to follow exercise prescription today without complaint.  Will continue to monitor for progression. First full day of exercise!  Patient was oriented to gym and equipment including functions, settings, policies, and procedures.  Patient's individual exercise prescription and treatment plan were reviewed.  All starting workloads were established based on the results of the 6 minute walk test done at initial orientation  visit.  The plan for exercise progression was also introduced and progression will be customized based on patient's performance and goals.     Dr. Firman Hughes is Medical Director for [facility] Cardiac Rehabilitation.  Dr. Fuad Aleskerov is Medical Director for [facility].

## 2023-10-30 ENCOUNTER — Other Ambulatory Visit (HOSPITAL_COMMUNITY): Payer: Self-pay

## 2023-10-31 ENCOUNTER — Other Ambulatory Visit: Payer: Self-pay | Admitting: Family Medicine

## 2023-10-31 ENCOUNTER — Ambulatory Visit: Payer: Self-pay

## 2023-10-31 ENCOUNTER — Encounter

## 2023-10-31 DIAGNOSIS — I340 Nonrheumatic mitral (valve) insufficiency: Secondary | ICD-10-CM

## 2023-10-31 NOTE — Telephone Encounter (Signed)
-----   Message from [name] sent at 10/24/2023  2:10 PM EDT ----- Normal LV function. Moderate leakiness of the mitral valve. Repeat echo one year to follow the mitral regurgitation. Thanks, Brandi Mahoney

## 2023-10-31 NOTE — Telephone Encounter (Signed)
 Called and spoke with patient who verbalized understanding. Repeat ECHO order placed to be done in 1 year

## 2023-11-02 ENCOUNTER — Encounter

## 2023-11-02 DIAGNOSIS — E119 Type 2 diabetes mellitus without complications: Secondary | ICD-10-CM | POA: Diagnosis not present

## 2023-11-02 DIAGNOSIS — Z713 Dietary counseling and surveillance: Secondary | ICD-10-CM | POA: Diagnosis not present

## 2023-11-02 DIAGNOSIS — Z48812 Encounter for surgical aftercare following surgery on the circulatory system: Secondary | ICD-10-CM | POA: Diagnosis not present

## 2023-11-02 DIAGNOSIS — Z955 Presence of coronary angioplasty implant and graft: Secondary | ICD-10-CM | POA: Diagnosis not present

## 2023-11-02 DIAGNOSIS — K9041 Non-celiac gluten sensitivity: Secondary | ICD-10-CM | POA: Diagnosis not present

## 2023-11-02 LAB — GLUCOSE, CAPILLARY: Glucose-Capillary: 111 mg/dL — ABNORMAL HIGH (ref 70–99)

## 2023-11-02 NOTE — Progress Notes (Signed)
 Daily Session Note  Patient Details  Name: NICKEY CREASEY MRN: [id number] Date of Birth: [birth year] Referring Provider:   Flowsheet Row Cardiac Rehab from 10/24/2023 in [facility] Cardiac and Pulmonary Rehab  Referring Provider Dr. Belva Boyden, MD       Encounter Date: 11/02/2023  Check In:  Session Check In - 11/02/23 0944       Check-In   Supervising physician immediately available to respond to emergencies See telemetry face sheet for immediately available ER MD    Location [facility]-Cardiac & Pulmonary Rehab    Staff Present Maud Sorenson, RN, BSN, CCRP;Joseph Hood RCP,RRT,BSRT;Maxon Gering BS, Exercise Physiologist;Noah Tickle, BS, Exercise Physiologist    Virtual Visit No    Medication changes reported     No    Fall or balance concerns reported    No    Warm-up and Cool-down Performed on first and last piece of equipment    Resistance Training Performed Yes    VAD Patient? No    PAD/SET Patient? No      Pain Assessment   Currently in Pain? No/denies                Social History   Tobacco Use  Smoking Status Former   Current packs/day: 0.00   Average packs/day: 0.4 packs/day for 35.0 years (12.3 ttl pk-yrs)   Types: Cigarettes   Start date: 09/25/1973   Quit date: 09/25/2008   Years since quitting: 15.1  Smokeless Tobacco Never  Tobacco Comments   quit in 2007-2008, social smoker    Goals Met:  Independence with exercise equipment Exercise tolerated well No report of concerns or symptoms today  Goals Unmet:  Not Applicable  Comments: Pt able to follow exercise prescription today without complaint.  Will continue to monitor for progression.    Dr. Firman Hughes is Medical Director for [facility] Cardiac Rehabilitation.  Dr. Fuad Aleskerov is Medical Director for [facility] Pulmonary Rehabilitation.

## 2023-11-05 DIAGNOSIS — H903 Sensorineural hearing loss, bilateral: Secondary | ICD-10-CM | POA: Diagnosis not present

## 2023-11-05 DIAGNOSIS — H6983 Other specified disorders of Eustachian tube, bilateral: Secondary | ICD-10-CM | POA: Diagnosis not present

## 2023-11-05 DIAGNOSIS — H6522 Chronic serous otitis media, left ear: Secondary | ICD-10-CM | POA: Diagnosis not present

## 2023-11-06 DIAGNOSIS — M48062 Spinal stenosis, lumbar region with neurogenic claudication: Secondary | ICD-10-CM | POA: Diagnosis not present

## 2023-11-06 DIAGNOSIS — M5416 Radiculopathy, lumbar region: Secondary | ICD-10-CM | POA: Diagnosis not present

## 2023-11-06 DIAGNOSIS — M5126 Other intervertebral disc displacement, lumbar region: Secondary | ICD-10-CM | POA: Diagnosis not present

## 2023-11-06 DIAGNOSIS — M5412 Radiculopathy, cervical region: Secondary | ICD-10-CM | POA: Diagnosis not present

## 2023-11-07 ENCOUNTER — Ambulatory Visit
Admission: RE | Admit: 2023-11-07 | Discharge: 2023-11-07 | Disposition: A | Discharge status: Home / Self Care | Source: Ambulatory Visit | Attending: Pulmonary Disease | Admitting: Pulmonary Disease

## 2023-11-07 ENCOUNTER — Encounter: Admitting: *Deleted

## 2023-11-07 DIAGNOSIS — Z955 Presence of coronary angioplasty implant and graft: Secondary | ICD-10-CM

## 2023-11-07 DIAGNOSIS — R0600 Dyspnea, unspecified: Secondary | ICD-10-CM | POA: Diagnosis not present

## 2023-11-07 DIAGNOSIS — E119 Type 2 diabetes mellitus without complications: Secondary | ICD-10-CM | POA: Diagnosis not present

## 2023-11-07 DIAGNOSIS — H2513 Age-related nuclear cataract, bilateral: Secondary | ICD-10-CM | POA: Diagnosis not present

## 2023-11-07 DIAGNOSIS — Z48812 Encounter for surgical aftercare following surgery on the circulatory system: Secondary | ICD-10-CM | POA: Diagnosis not present

## 2023-11-07 DIAGNOSIS — I700 Atherosclerosis of aorta: Secondary | ICD-10-CM | POA: Insufficient documentation

## 2023-11-07 DIAGNOSIS — R918 Other nonspecific abnormal finding of lung field: Secondary | ICD-10-CM | POA: Diagnosis not present

## 2023-11-07 DIAGNOSIS — J849 Interstitial pulmonary disease, unspecified: Secondary | ICD-10-CM | POA: Diagnosis not present

## 2023-11-07 LAB — HM DIABETES EYE EXAM

## 2023-11-07 LAB — GLUCOSE, CAPILLARY: Glucose-Capillary: 149 mg/dL — ABNORMAL HIGH (ref 70–99)

## 2023-11-07 NOTE — Progress Notes (Signed)
 Daily Session Note  Patient Details  Name: Brandi Mahoney MRN: [id number] Date of Birth: [birth year] Referring Provider:   Flowsheet Row Cardiac Rehab from 10/24/2023 in [facility] Cardiac and Pulmonary Rehab  Referring Provider Dr. Belva Boyden, MD       Encounter Date: 11/07/2023  Check In:  Session Check In - 11/07/23 1009       Check-In   Supervising physician immediately available to respond to emergencies See telemetry face sheet for immediately available ER MD    Location [facility]-Cardiac & Pulmonary Rehab    Staff Present Maud Sorenson, RN, BSN, CCRP;Maxon Conetta BS, Exercise Physiologist;Noah Tickle, BS, Exercise Physiologist;Jason Martina Sledge RDN,LDN    Virtual Visit No    Medication changes reported     No    Fall or balance concerns reported    No    Warm-up and Cool-down Performed on first and last piece of equipment    Resistance Training Performed Yes    VAD Patient? No    PAD/SET Patient? No      Pain Assessment   Currently in Pain? No/denies                Social History   Tobacco Use  Smoking Status Former   Current packs/day: 0.00   Average packs/day: 0.4 packs/day for 35.0 years (12.3 ttl pk-yrs)   Types: Cigarettes   Start date: 09/25/1973   Quit date: 09/25/2008   Years since quitting: 15.1  Smokeless Tobacco Never  Tobacco Comments   quit in 2007-2008, social smoker    Goals Met:  Independence with exercise equipment Exercise tolerated well No report of concerns or symptoms today  Goals Unmet:  Not Applicable  Comments: Pt able to follow exercise prescription today without complaint.  Will continue to monitor for progression.    Dr. Firman Hughes is Medical Director for [facility] Cardiac Rehabilitation.  Dr. Fuad Aleskerov is Medical Director for [facility] Pulmonary Rehabilitation.

## 2023-11-08 ENCOUNTER — Encounter: Payer: Self-pay | Admitting: Family Medicine

## 2023-11-08 DIAGNOSIS — L900 Lichen sclerosus et atrophicus: Secondary | ICD-10-CM | POA: Diagnosis not present

## 2023-11-08 DIAGNOSIS — K117 Disturbances of salivary secretion: Secondary | ICD-10-CM | POA: Diagnosis not present

## 2023-11-08 DIAGNOSIS — K121 Other forms of stomatitis: Secondary | ICD-10-CM | POA: Diagnosis not present

## 2023-11-09 ENCOUNTER — Encounter: Admitting: *Deleted

## 2023-11-09 DIAGNOSIS — K9041 Non-celiac gluten sensitivity: Secondary | ICD-10-CM | POA: Diagnosis not present

## 2023-11-09 DIAGNOSIS — E119 Type 2 diabetes mellitus without complications: Secondary | ICD-10-CM | POA: Diagnosis not present

## 2023-11-09 DIAGNOSIS — Z48812 Encounter for surgical aftercare following surgery on the circulatory system: Secondary | ICD-10-CM | POA: Diagnosis not present

## 2023-11-09 DIAGNOSIS — Z955 Presence of coronary angioplasty implant and graft: Secondary | ICD-10-CM | POA: Diagnosis not present

## 2023-11-09 DIAGNOSIS — Z713 Dietary counseling and surveillance: Secondary | ICD-10-CM | POA: Diagnosis not present

## 2023-11-09 NOTE — Progress Notes (Signed)
 Daily Session Note  Patient Details  Name: Brandi Mahoney MRN: [id number] Date of Birth: [birth year] Referring Provider:   Flowsheet Row Cardiac Rehab from 10/24/2023 in [facility] Cardiac and Pulmonary Rehab  Referring Provider Dr. Belva Boyden, MD       Encounter Date: 11/09/2023  Check In:  Session Check In - 11/09/23 0942       Check-In   Supervising physician immediately available to respond to emergencies See telemetry face sheet for immediately available ER MD    Location [facility]-Cardiac & Pulmonary Rehab    Staff Present Maud Sorenson, RN, BSN, CCRP;Joseph Hood RCP,RRT,BSRT;Maxon [place] BS, Exercise Physiologist;Noah Tickle, BS, Exercise Physiologist    Virtual Visit No    Medication changes reported     No    Fall or balance concerns reported    No    Warm-up and Cool-down Performed on first and last piece of equipment    Resistance Training Performed Yes    VAD Patient? No    PAD/SET Patient? No      Pain Assessment   Currently in Pain? No/denies                Social History   Tobacco Use  Smoking Status Former   Current packs/day: 0.00   Average packs/day: 0.4 packs/day for 35.0 years (12.3 ttl pk-yrs)   Types: Cigarettes   Start date: 09/25/1973   Quit date: 09/25/2008   Years since quitting: 15.1  Smokeless Tobacco Never  Tobacco Comments   quit in 2007-2008, social smoker    Goals Met:  Independence with exercise equipment Exercise tolerated well No report of concerns or symptoms today  Goals Unmet:  Not Applicable  Comments: Pt able to follow exercise prescription today without complaint.  Will continue to monitor for progression.    Dr. Firman Hughes is Medical Director for [facility] Cardiac Rehabilitation.  Dr. Fuad Aleskerov is Medical Director for [facility] Pulmonary Rehabilitation.

## 2023-11-13 ENCOUNTER — Ambulatory Visit: Admitting: Physician Assistant

## 2023-11-13 NOTE — Progress Notes (Unsigned)
     Zavien Clubb T. Rian Busche, MD, CAQ Sports Medicine [facility] at [facility] [address] [place], [address]  Phone: [phone]  FAX: [phone]  Brandi Mahoney - 68 y.o. female  MRN [id number]  Date of Birth: 08/30/1955  Date: 11/14/2023  PCP: Scherrie Curt, MD  Referral: Scherrie Curt, MD  No chief complaint on file.  Subjective:   Brandi Mahoney is a 68 y.o. very pleasant female patient with There is no height or weight on file to calculate BMI. who presents with the following:  Patient presents with a bump on the outer aspect of her knee.    Review of Systems is noted in the HPI, as appropriate  Objective:   There were no vitals taken for this visit.  GEN: No acute distress; alert,appropriate. PULM: Breathing comfortably in no respiratory distress PSYCH: Normally interactive.   Laboratory and Imaging Data:  Assessment and Plan:   ***

## 2023-11-14 ENCOUNTER — Encounter

## 2023-11-14 ENCOUNTER — Ambulatory Visit (INDEPENDENT_AMBULATORY_CARE_PROVIDER_SITE_OTHER): Admitting: Family Medicine

## 2023-11-14 ENCOUNTER — Other Ambulatory Visit: Payer: Self-pay | Admitting: Family Medicine

## 2023-11-14 ENCOUNTER — Encounter: Payer: Self-pay | Admitting: Family Medicine

## 2023-11-14 VITALS — BP 120/72 | HR 69 | Temp 97.7°F | Ht 63.5 in | Wt 157.0 lb

## 2023-11-14 DIAGNOSIS — S8002XA Contusion of left knee, initial encounter: Secondary | ICD-10-CM

## 2023-11-16 ENCOUNTER — Ambulatory Visit

## 2023-11-16 ENCOUNTER — Ambulatory Visit (HOSPITAL_COMMUNITY)
Admission: RE | Admit: 2023-11-16 | Discharge: 2023-11-16 | Disposition: A | Discharge status: Home / Self Care | Source: Ambulatory Visit | Attending: Vascular Surgery | Admitting: Vascular Surgery

## 2023-11-16 VITALS — BP 124/73 | HR 66 | Temp 98.0°F | Ht 63.5 in | Wt 158.0 lb

## 2023-11-16 DIAGNOSIS — I729 Aneurysm of unspecified site: Secondary | ICD-10-CM

## 2023-11-16 DIAGNOSIS — T81718A Complication of other artery following a procedure, not elsewhere classified, initial encounter: Secondary | ICD-10-CM

## 2023-11-16 DIAGNOSIS — I721 Aneurysm of artery of upper extremity: Secondary | ICD-10-CM

## 2023-11-16 NOTE — Progress Notes (Signed)
 POST OPERATIVE OFFICE NOTE    CC:  F/u for surgery  HPI:  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 Her pseudoaneurysm formed after cardiac cath. She was initially seen post operatively 1 month ago for incision check. AT that time her hand was feeling much better. No longer having tingling in her hand or fingers. Her incision was healing very well and she had brisk doppler signals with palpable pulses as well.   Today she reports occasional very intermittent aching in her right forearm. Much improved from how it felt around time of surgery. Her incision has healed well. She denies any numbness, weakness, tingling or swelling in her arm. She is medically managed on Aspirin  and statin  Allergies  Allergen Reactions   Cat Dander Shortness Of Breath   Pseudoeph-Doxylamine-Dm-Apap Anaphylaxis    Allergy to nyquil   Gluten Other (See Comments)    Gi upset   Penicillins Itching and Rash    Current Outpatient Medications  Medication Sig Dispense Refill   Accu-Chek Softclix Lancets lancets Use to check blood sugar daily 100 each 3   albuterol  (VENTOLIN  HFA) 108 (90 Base) MCG/ACT inhaler Inhale 2 puffs into the lungs every 6 (six) hours as needed for wheezing or shortness of breath. 8 g 2   aspirin  EC 81 MG tablet Take 81 mg by mouth daily.     clobetasol  ointment (TEMOVATE ) 0.05 % Apply 1 Application topically daily as needed (skin conditions).     fenofibrate  160 MG tablet Take 1 tablet (160 mg total) by mouth daily. 90 tablet 3   fluticasone (FLONASE) 50 MCG/ACT nasal spray Place 2 sprays into both nostrils in the morning and at bedtime.     gabapentin  (NEURONTIN ) 300 MG capsule Take 300 mg by mouth at bedtime.     glucose blood (ACCU-CHEK GUIDE) test strip Use to check blood sugar daily 100 each 3   Lactobacillus-Inulin (CULTURELLE ADULT ULT BALANCE PO) Take 1 tablet by mouth daily.     levothyroxine  (SYNTHROID ) 75  MCG tablet TAKE ONE TABLET BY MOUTH EVERY MORNING BEFORE BREAKFAST 90 tablet 3   Magnesium  250 MG TABS Take 250 mg by mouth daily.     metFORMIN  (GLUCOPHAGE -XR) 500 MG 24 hr tablet Take 1,000 mg by mouth 2 (two) times daily with a meal.     metoprolol  succinate (TOPROL -XL) 50 MG 24 hr tablet Take 1 tablet (50 mg total) by mouth daily. Take with or immediately following a meal. 90 tablet 3   Multiple Vitamins-Minerals (ONE-A-DAY WOMENS PETITES PO) Take 1 tablet by mouth daily.     nitroGLYCERIN  (NITROSTAT ) 0.4 MG SL tablet Place 1 tablet under tongue at onset of chest pain; you may repeat every 5 minutes for up to 3 doses. 25 tablet 3   pantoprazole  (PROTONIX ) 40 MG tablet Take 1 tablet (40 mg total) by mouth daily. 90 tablet 3   prasugrel  (EFFIENT ) 10 MG TABS tablet Take 1 tablet (10 mg total) by mouth daily. 30 tablet 11   rosuvastatin  (CRESTOR ) 40 MG tablet Take 1 tablet (40 mg total) by mouth daily. 90 tablet 3   sertraline  (ZOLOFT ) 50 MG tablet TAKE 1 TABLET BY MOUTH DAILY 90 tablet 0   traMADol (ULTRAM) 50 MG tablet Take 50 mg by mouth 2 (two) times daily as needed.     traZODone  (DESYREL ) 100 MG tablet TAKE 1 TABLET BY MOUTH AT BEDTIME 30 tablet 2  No current facility-administered medications for this visit.     ROS:  See HPI  Physical Exam:  Vitals:   11/16/23 0941  BP: 124/73  Pulse: 66  Temp: 98 F (36.7 C)  SpO2: 96%    Incision:  right wrist, well healed Extremities:  2+ ulnar and radial pulses, hand warm and well perfused, 5/5 grip strength Neuro: alert and oriented  Non invasive Vascular lab: VAS US  Upper Extremity arterial duplex: Summary:   Right: No obstruction visualized in the right upper extremity. Widely patent right ulnar artery.    Assessment/Plan:  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 Her pseudoaneurysm formed after cardiac cath. She has done well post operatively. Her incision is well healed.  Her symptoms are essentially resolved. She gets very mild occasional aching in her forearm. I anticipate that this will go away with time. Her hand is well perfused and warm with palpable pulses. 5/5 grip strength. Her duplex today shows widely patent vessels in the RUE with triphasic flow. Recommend she continue on Aspirin  and statin. She can follow up with us  as needed.    Wynonia Hedges, [facility] [phone]   Clinic MD:  Susi Eric

## 2023-11-19 ENCOUNTER — Encounter: Attending: Cardiovascular Disease

## 2023-11-19 DIAGNOSIS — K5900 Constipation, unspecified: Secondary | ICD-10-CM | POA: Diagnosis not present

## 2023-11-19 DIAGNOSIS — Z48812 Encounter for surgical aftercare following surgery on the circulatory system: Secondary | ICD-10-CM | POA: Insufficient documentation

## 2023-11-19 DIAGNOSIS — Z955 Presence of coronary angioplasty implant and graft: Secondary | ICD-10-CM | POA: Insufficient documentation

## 2023-11-19 DIAGNOSIS — R768 Other specified abnormal immunological findings in serum: Secondary | ICD-10-CM | POA: Diagnosis not present

## 2023-11-19 DIAGNOSIS — R682 Dry mouth, unspecified: Secondary | ICD-10-CM | POA: Diagnosis not present

## 2023-11-19 NOTE — Progress Notes (Signed)
 Daily Session Note  Patient Details  Name: Brandi Mahoney MRN: [id number] Date of Birth: [birth year] Referring Provider:   Flowsheet Row Cardiac Rehab from 10/24/2023 in [facility] Cardiac and Pulmonary Rehab  Referring Provider Dr. Belva Boyden, MD       Encounter Date: 11/19/2023  Check In:  Session Check In - 11/19/23 0948       Check-In   Supervising physician immediately available to respond to emergencies See telemetry face sheet for immediately available ER MD    Location [facility]-Cardiac & Pulmonary Rehab    Staff Present Lyell Samuel, MS, Exercise Physiologist;Xavi Tomasik Dawne Euler, ACSM CEP, Exercise Physiologist;Jashua Knaak RN,BSN,MPA;Joseph Hood RCP,RRT,BSRT    Virtual Visit No    Medication changes reported     No    Fall or balance concerns reported    No    Warm-up and Cool-down Performed on first and last piece of equipment    Resistance Training Performed Yes    VAD Patient? No    PAD/SET Patient? No      Pain Assessment   Currently in Pain? No/denies                Social History   Tobacco Use  Smoking Status Former   Current packs/day: 0.00   Average packs/day: 0.4 packs/day for 35.0 years (12.3 ttl pk-yrs)   Types: Cigarettes   Start date: 09/25/1973   Quit date: 09/25/2008   Years since quitting: 15.1  Smokeless Tobacco Never  Tobacco Comments   quit in 2007-2008, social smoker    Goals Met:  Independence with exercise equipment Exercise tolerated well No report of concerns or symptoms today Strength training completed today  Goals Unmet:  Not Applicable  Comments: Pt able to follow exercise prescription today without complaint. Patient did have an upset stomach and did not do resistance exercise today. Will continue to monitor for progression.    Dr. Firman Hughes is Medical Director for [facility] Cardiac Rehabilitation.  Dr. Fuad Aleskerov is Medical Director for [facility] Pulmonary Rehabilitation.

## 2023-11-21 ENCOUNTER — Encounter

## 2023-11-21 ENCOUNTER — Encounter: Payer: Self-pay | Admitting: *Deleted

## 2023-11-21 DIAGNOSIS — Z48812 Encounter for surgical aftercare following surgery on the circulatory system: Secondary | ICD-10-CM | POA: Diagnosis not present

## 2023-11-21 DIAGNOSIS — Z955 Presence of coronary angioplasty implant and graft: Secondary | ICD-10-CM

## 2023-11-21 NOTE — Progress Notes (Signed)
 Daily Session Note  Patient Details  Name: Brandi Mahoney MRN: [id number] Date of Birth: 05/02/1956 Referring Provider:   Flowsheet Row Cardiac Rehab from 10/24/2023 in [facility] Cardiac and Pulmonary Rehab  Referring Provider Dr. Belva Boyden, MD       Encounter Date: 11/21/2023  Check In:  Session Check In - 11/21/23 0932       Check-In   Supervising physician immediately available to respond to emergencies See telemetry face sheet for immediately available ER MD    Location [facility]-Cardiac & Pulmonary Rehab    Staff Present Sherle Dire, BS, Exercise Physiologist;Layth Cerezo RN,BSN,MPA;Maxon Conetta BS, Exercise Physiologist;Joseph Lacinda Pica RCP,RRT,BSRT    Virtual Visit No    Medication changes reported     No    Fall or balance concerns reported    No    Warm-up and Cool-down Performed on first and last piece of equipment    Resistance Training Performed Yes    VAD Patient? No    PAD/SET Patient? No      Pain Assessment   Currently in Pain? No/denies                Social History   Tobacco Use  Smoking Status Former   Current packs/day: 0.00   Average packs/day: 0.4 packs/day for 35.0 years (12.3 ttl pk-yrs)   Types: Cigarettes   Start date: 09/25/1973   Quit date: 09/25/2008   Years since quitting: 15.1  Smokeless Tobacco Never  Tobacco Comments   quit in 2007-2008, social smoker    Goals Met:  Independence with exercise equipment Exercise tolerated well No report of concerns or symptoms today Strength training completed today  Goals Unmet:  Not Applicable  Comments: Pt able to follow exercise prescription today without complaint.  Will continue to monitor for progression.    Dr. Firman Hughes is Medical Director for [facility] Cardiac Rehabilitation.  Dr. Fuad Aleskerov is Medical Director for [facility] Pulmonary Rehabilitation.

## 2023-11-21 NOTE — Progress Notes (Signed)
 Cardiac Individual Treatment Plan  Patient Details  Name: Brandi Mahoney MRN: [id number] Date of Birth: Mar 08, 1956 Referring Provider:   Flowsheet Row Cardiac Rehab from 10/24/2023 in [facility] Cardiac and Pulmonary Rehab  Referring Provider Dr. Belva Boyden, MD       Initial Encounter Date:  Flowsheet Row Cardiac Rehab from 10/24/2023 in [facility] Cardiac and Pulmonary Rehab  Date 10/24/23       Visit Diagnosis: Status post coronary artery stent placement  Patient's Home Medications on Admission:  Current Outpatient Medications:    Accu-Chek Softclix Lancets lancets, Use to check blood sugar daily, Disp: 100 each, Rfl: 3   albuterol  (VENTOLIN  HFA) 108 (90 Base) MCG/ACT inhaler, Inhale 2 puffs into the lungs every 6 (six) hours as needed for wheezing or shortness of breath., Disp: 8 g, Rfl: 2   aspirin  EC 81 MG tablet, Take 81 mg by mouth daily., Disp: , Rfl:    clobetasol  ointment (TEMOVATE ) 0.05 %, Apply 1 Application topically daily as needed (skin conditions)., Disp: , Rfl:    fenofibrate  160 MG tablet, Take 1 tablet (160 mg total) by mouth daily., Disp: 90 tablet, Rfl: 3   fluticasone (FLONASE) 50 MCG/ACT nasal spray, Place 2 sprays into both nostrils in the morning and at bedtime., Disp: , Rfl:    gabapentin  (NEURONTIN ) 300 MG capsule, Take 300 mg by mouth at bedtime., Disp: , Rfl:    glucose blood (ACCU-CHEK GUIDE) test strip, Use to check blood sugar daily, Disp: 100 each, Rfl: 3   Lactobacillus-Inulin (CULTURELLE ADULT ULT BALANCE PO), Take 1 tablet by mouth daily., Disp: , Rfl:    levothyroxine  (SYNTHROID ) 75 MCG tablet, TAKE ONE TABLET BY MOUTH EVERY MORNING BEFORE BREAKFAST, Disp: 90 tablet, Rfl: 3   Magnesium  250 MG TABS, Take 250 mg by mouth daily., Disp: , Rfl:    metFORMIN  (GLUCOPHAGE -XR) 500 MG 24 hr tablet, Take 1,000 mg by mouth 2 (two) times daily with a meal., Disp: , Rfl:    metoprolol  succinate (TOPROL -XL) 50 MG 24 hr tablet, Take 1 tablet (50 mg total) by mouth daily.  Take with or immediately following a meal., Disp: 90 tablet, Rfl: 3   Multiple Vitamins-Minerals (ONE-A-DAY WOMENS PETITES PO), Take 1 tablet by mouth daily., Disp: , Rfl:    nitroGLYCERIN  (NITROSTAT ) 0.4 MG SL tablet, Place 1 tablet under tongue at onset of chest pain; you may repeat every 5 minutes for up to 3 doses., Disp: 25 tablet, Rfl: 3   pantoprazole  (PROTONIX ) 40 MG tablet, Take 1 tablet (40 mg total) by mouth daily., Disp: 90 tablet, Rfl: 3   prasugrel  (EFFIENT ) 10 MG TABS tablet, Take 1 tablet (10 mg total) by mouth daily., Disp: 30 tablet, Rfl: 11   rosuvastatin  (CRESTOR ) 40 MG tablet, Take 1 tablet (40 mg total) by mouth daily., Disp: 90 tablet, Rfl: 3   sertraline  (ZOLOFT ) 50 MG tablet, TAKE 1 TABLET BY MOUTH DAILY, Disp: 90 tablet, Rfl: 0   traMADol (ULTRAM) 50 MG tablet, Take 50 mg by mouth 2 (two) times daily as needed., Disp: , Rfl:    traZODone  (DESYREL ) 100 MG tablet, TAKE 1 TABLET BY MOUTH AT BEDTIME, Disp: 30 tablet, Rfl: 2  Past Medical History: Past Medical History:  Diagnosis Date   CAD (coronary artery disease)    a. 01/2010 Cath: mild atherosclerosis in the mid LCX after takeoff of OM1, mild diff LAD dzs;  b. 07/2013 MV: EF 67%, no ischemia.   GAD (generalized anxiety disorder)    History  of kidney stones    HLD (hyperlipidemia)    Hypothyroidism    Major depressive disorder, recurrent episode, in full remission (HCC) 09/08/2008   Qualifier: Diagnosis of  By: Geralyn Knee MD, [place]     Migraine headache    Myocarditis (HCC)    a. 01/2010 presented w/ c/p and elev trop-->Cath: mild atherosclerosis in the mid LCX after takeoff of OM1, mild diff LAD dzs;  b. 03/2010 Echo: EF 60-65%, DD, mild LVH;  c. 07/2011 Echo: EF 55-60%, mild LVH, no rwma.   Sleep apnea    has c-pap.  wears off and on - not lately  using  per reported 07-29-2020   Type 2 diabetes, diet controlled (HCC)     Tobacco Use: Social History   Tobacco Use  Smoking Status Former   Current packs/day: 0.00    Average packs/day: 0.4 packs/day for 35.0 years (12.3 ttl pk-yrs)   Types: Cigarettes   Start date: 09/25/1973   Quit date: 09/25/2008   Years since quitting: 15.1  Smokeless Tobacco Never  Tobacco Comments   quit in 2007-2008, social smoker    Labs: Review Flowsheet  More data exists      Latest Ref Rng & Units 09/28/2021 12/27/2021 08/23/2022 11/22/2022 08/06/2023  Labs for ITP Cardiac and Pulmonary Rehab  Cholestrol 0 - 200 mg/dL 253  - - 664  -  LDL (calc) 0 - 99 mg/dL 56  - - - -  Direct LDL mg/dL - - - 40.3  -  HDL-C >47.42 mg/dL 59.56  - - 38.75  -  Trlycerides 0.0 - 149.0 mg/dL 643.3  - - 295.1  -  Hemoglobin A1c 4.0 - 5.6 % 7.4  7.2  8.3  7.8  6.4      Exercise Target Goals: Exercise Program Goal: Individual exercise prescription set using results from initial 6 min walk test and THRR while considering  patient's activity barriers and safety.   Exercise Prescription Goal: Initial exercise prescription builds to 30-45 minutes a day of aerobic activity, 2-3 days per week.  Home exercise guidelines will be given to patient during program as part of exercise prescription that the participant will acknowledge.   Education: Aerobic Exercise: - Group verbal and visual presentation on the components of exercise prescription. Introduces F.I.T.T principle from ACSM for exercise prescriptions.  Reviews F.I.T.T. principles of aerobic exercise including progression. Written material given at graduation.   Education: Resistance Exercise: - Group verbal and visual presentation on the components of exercise prescription. Introduces F.I.T.T principle from ACSM for exercise prescriptions  Reviews F.I.T.T. principles of resistance exercise including progression. Written material given at graduation.    Education: Exercise & Equipment Safety: - Individual verbal instruction and demonstration of equipment use and safety with use of the equipment. Flowsheet Row Cardiac Rehab from 10/24/2023 in  [facility] Cardiac and Pulmonary Rehab  Date 10/24/23  Educator NT  Instruction Review Code 1- Verbalizes Understanding       Education: Exercise Physiology & General Exercise Guidelines: - Group verbal and written instruction with models to review the exercise physiology of the cardiovascular system and associated critical values. Provides general exercise guidelines with specific guidelines to those with heart or lung disease.    Education: Flexibility, Balance, Mind/Body Relaxation: - Group verbal and visual presentation with interactive activity on the components of exercise prescription. Introduces F.I.T.T principle from ACSM for exercise prescriptions. Reviews F.I.T.T. principles of flexibility and balance exercise training including progression. Also discusses the mind body connection.  Reviews various  relaxation techniques to help reduce and manage stress (i.e. Deep breathing, progressive muscle relaxation, and visualization). Balance handout provided to take home. Written material given at graduation.   Activity Barriers & Risk Stratification:  Activity Barriers & Cardiac Risk Stratification - 10/23/23 1306       Activity Barriers & Cardiac Risk Stratification   Activity Barriers Back Problems;Arthritis    Cardiac Risk Stratification Moderate             6 Minute Walk:  6 Minute Walk     Row Name 10/24/23 1127         6 Minute Walk   Phase Initial     Distance 1235 feet     Walk Time 6 minutes     # of Rest Breaks 0     MPH 2.34     METS 2.96     RPE 10     Perceived Dyspnea  1     VO2 Peak 10.34     Symptoms No     Resting HR 78 bpm     Resting BP 128/64     Resting Oxygen Saturation  96 %     Exercise Oxygen Saturation  during 6 min walk 96 %     Max Ex. HR 99 bpm     Max Ex. BP 130/66     2 Minute Post BP 122/58              Oxygen Initial Assessment:   Oxygen Re-Evaluation:   Oxygen Discharge (Final Oxygen Re-Evaluation):   Initial  Exercise Prescription:  Initial Exercise Prescription - 10/24/23 1100       Date of Initial Exercise RX and Referring Provider   Date 10/24/23    Referring Provider Dr. Timothy Gollan, MD      Oxygen   Maintain Oxygen Saturation 88% or higher      Treadmill   MPH 2.3    Grade 1    Minutes 15    METs 3.08      NuStep   Level 2    SPM 80    Minutes 15    METs 2.96      REL-XR   Level 2    Speed 50    Minutes 15    METs 2.96      Prescription Details   Frequency (times per week) 3    Duration Progress to 30 minutes of continuous aerobic without signs/symptoms of physical distress      Intensity   THRR 40-80% of Max Heartrate 108-138    Ratings of Perceived Exertion 11-13    Perceived Dyspnea 0-4      Progression   Progression Continue to progress workloads to maintain intensity without signs/symptoms of physical distress.      Resistance Training   Training Prescription Yes    Weight 4 lb    Reps 10-15             Perform Capillary Blood Glucose checks as needed.  Exercise Prescription Changes:   Exercise Prescription Changes     Row Name 10/24/23 1100 11/13/23 1400           Response to Exercise   Blood Pressure (Admit) 128/64 118/60      Blood Pressure (Exercise) 130/66 134/68      Blood Pressure (Exit) 122/58 118/62      Heart Rate (Admit) 78 bpm 77 bpm      Heart Rate (Exercise) 99 bpm 110 bpm  Heart Rate (Exit) 75 bpm 64 bpm      Oxygen Saturation (Admit) 96 % --      Oxygen Saturation (Exercise) 96 % --      Rating of Perceived Exertion (Exercise) 10 13      Perceived Dyspnea (Exercise) 1 0      Symptoms none none      Comments Results first 2 weeks of exercise      Duration -- Progress to 30 minutes of  aerobic without signs/symptoms of physical distress      Intensity -- THRR unchanged        Progression   Progression -- Continue to progress workloads to maintain intensity without signs/symptoms of physical distress.       Average METs -- 2.4        Resistance Training   Training Prescription -- Yes      Weight -- 4 lb      Reps -- 10-15        Interval Training   Interval Training -- No        Treadmill   MPH -- 2.2      Grade -- 1      Minutes -- 15      METs -- 2.99        NuStep   Level -- 2  T6      Minutes -- 15      METs -- 1.7        REL-XR   Level -- 1      Minutes -- 15      METs -- 1.4        Oxygen   Maintain Oxygen Saturation -- 88% or higher               Exercise Comments:   Exercise Comments     Row Name 10/29/23 0954           Exercise Comments First full day of exercise!  Patient was oriented to gym and equipment including functions, settings, policies, and procedures.  Patient's individual exercise prescription and treatment plan were reviewed.  All starting workloads were established based on the results of the 6 minute walk test done at initial orientation visit.  The plan for exercise progression was also introduced and progression will be customized based on patient's performance and goals.                Exercise Goals and Review:   Exercise Goals     Row Name 10/24/23 1128             Exercise Goals   Increase Physical Activity Yes       Intervention Provide advice, education, support and counseling about physical activity/exercise needs.;Develop an individualized exercise prescription for aerobic and resistive training based on initial evaluation findings, risk stratification, comorbidities and participant's personal goals.       Expected Outcomes Short Term: Attend rehab on a regular basis to increase amount of physical activity.;Long Term: Add in home exercise to make exercise part of routine and to increase amount of physical activity.;Long Term: Exercising regularly at least 3-5 days a week.       Increase Strength and Stamina Yes       Intervention Provide advice, education, support and counseling about physical activity/exercise  needs.;Develop an individualized exercise prescription for aerobic and resistive training based on initial evaluation findings, risk stratification, comorbidities and participant's personal goals.       Expected  Outcomes Short Term: Increase workloads from initial exercise prescription for resistance, speed, and METs.;Short Term: Perform resistance training exercises routinely during rehab and add in resistance training at home;Long Term: Improve cardiorespiratory fitness, muscular endurance and strength as measured by increased METs and functional capacity ( )       Able to understand and use rate of perceived exertion (RPE) scale Yes       Intervention Provide education and explanation on how to use RPE scale       Expected Outcomes Short Term: Able to use RPE daily in rehab to express subjective intensity level;Long Term:  Able to use RPE to guide intensity level when exercising independently       Able to understand and use Dyspnea scale Yes       Intervention Provide education and explanation on how to use Dyspnea scale       Expected Outcomes Short Term: Able to use Dyspnea scale daily in rehab to express subjective sense of shortness of breath during exertion;Long Term: Able to use Dyspnea scale to guide intensity level when exercising independently       Knowledge and understanding of Target Heart Rate Range (THRR) Yes       Intervention Provide education and explanation of THRR including how the numbers were predicted and where they are located for reference       Expected Outcomes Short Term: Able to state/look up THRR;Long Term: Able to use THRR to govern intensity when exercising independently;Short Term: Able to use daily as guideline for intensity in rehab       Able to check pulse independently Yes       Intervention Provide education and demonstration on how to check pulse in carotid and radial arteries.;Review the importance of being able to check your own pulse for safety during  independent exercise       Expected Outcomes Short Term: Able to explain why pulse checking is important during independent exercise;Long Term: Able to check pulse independently and accurately       Understanding of Exercise Prescription Yes       Intervention Provide education, explanation, and written materials on patient's individual exercise prescription       Expected Outcomes Short Term: Able to explain program exercise prescription;Long Term: Able to explain home exercise prescription to exercise independently                Exercise Goals Re-Evaluation :  Exercise Goals Re-Evaluation     Row Name 10/29/23 0955 11/13/23 1437           Exercise Goal Re-Evaluation   Exercise Goals Review Able to understand and use rate of perceived exertion (RPE) scale;Able to understand and use Dyspnea scale;Knowledge and understanding of Target Heart Rate Range (THRR) Increase Physical Activity;Increase Strength and Stamina;Understanding of Exercise Prescription      Comments Reviewed RPE and dyspnea scale, THR and program prescription with pt today.  Pt voiced understanding and was given a copy of goals to take home. Austine Blunt is off to a good start in the program. She was able to attend her first four sessions during this review. During her first sessions she was able to use the treadmill at a speed of 2. and 1% incline, and the T6 nustep at level 2. We will continue to monitor her progress in the program.      Expected Outcomes Short: Use RPE daily to regulate intensity. Long: Follow program prescription in THR. Short: Continue to follow exercise  prescription. Long: Continue exercise to imporve strength and stamina.               Discharge Exercise Prescription (Final Exercise Prescription Changes):  Exercise Prescription Changes - 11/13/23 1400       Response to Exercise   Blood Pressure (Admit) 118/60    Blood Pressure (Exercise) 134/68    Blood Pressure (Exit) 118/62    Heart Rate  (Admit) 77 bpm    Heart Rate (Exercise) 110 bpm    Heart Rate (Exit) 64 bpm    Rating of Perceived Exertion (Exercise) 13    Perceived Dyspnea (Exercise) 0    Symptoms none    Comments first 2 weeks of exercise    Duration Progress to 30 minutes of  aerobic without signs/symptoms of physical distress    Intensity THRR unchanged      Progression   Progression Continue to progress workloads to maintain intensity without signs/symptoms of physical distress.    Average METs 2.4      Resistance Training   Training Prescription Yes    Weight 4 lb    Reps 10-15      Interval Training   Interval Training No      Treadmill   MPH 2.2    Grade 1    Minutes 15    METs 2.99      NuStep   Level 2   T6   Minutes 15    METs 1.7      REL-XR   Level 1    Minutes 15    METs 1.4      Oxygen   Maintain Oxygen Saturation 88% or higher             Nutrition:  Target Goals: Understanding of nutrition guidelines, daily intake of sodium 1500mg , cholesterol 200mg , calories 30% from fat and 7% or less from saturated fats, daily to have 5 or more servings of fruits and vegetables.  Education: All About Nutrition: -Group instruction provided by verbal, written material, interactive activities, discussions, models, and posters to present general guidelines for heart healthy nutrition including fat, fiber, MyPlate, the role of sodium in heart healthy nutrition, utilization of the nutrition label, and utilization of this knowledge for meal planning. Follow up email sent as well. Written material given at graduation.   Biometrics:  Pre Biometrics - 10/24/23 1130       Pre Biometrics   Height 5' 4.5" (1.638 m)    Weight 154 lb 14.4 oz (70.3 kg)    Waist Circumference 36 inches    Hip Circumference 39 inches    Waist to Hip Ratio 0.92 %    BMI (Calculated) 26.19    Single Leg Stand 4.9 seconds              Nutrition Therapy Plan and Nutrition Goals:  Nutrition Therapy & Goals  - 10/24/23 1513       Nutrition Therapy   Diet Carb controlled, Cardiac, Low Na    Protein (specify units) 70-90g    Fiber 25 grams    Whole Grain Foods 3 servings    Saturated Fats 15 max. grams    Fruits and Vegetables 5 servings/day    Sodium 2 grams      Personal Nutrition Goals   Nutrition Goal Read labels and reduce sodium intake to below 2300mg . Ideally 1500mg  per day.    Personal Goal #2 Eat 15-30gProtein and 30-60gCarbs at each meal.    Personal Goal #3 Reduce  saturated fat, less than 12g per day. Replace bad fats for more heart healthy fats.    Comments Patient drinking ~50-75oz of water. She eats 3 meals per day, sometimes will have fruit as a snack between meals. She has DM and knows to monitor her carb intake. Reviewed goal of ~30-60g carbs per meal. Encouraged her to pair her carbs with protein. She has been watching her salt intake trying to stay below 2300mg . Provided her a guideline of less than 1500mg  per day. Reviewed mediterranean diet handout. Educated on types of fats, sources, and how to read labels. Reviewed several facts labels together. She is knowledgeable of foods and macros. She knows what to do and has followed a heart healthy diet years ago. She wants to set goal to be more strict on sodium and saturated fats, and stay consistent with her healthy eating.      Intervention Plan   Intervention Prescribe, educate and counsel regarding individualized specific dietary modifications aiming towards targeted core components such as weight, hypertension, lipid management, diabetes, heart failure and other comorbidities.;Nutrition handout(s) given to patient.    Expected Outcomes Short Term Goal: Understand basic principles of dietary content, such as calories, fat, sodium, cholesterol and nutrients.;Short Term Goal: A plan has been developed with personal nutrition goals set during dietitian appointment.;Long Term Goal: Adherence to prescribed nutrition plan.              Nutrition Assessments:  MEDIFICTS Score Key: >=70 Need to make dietary changes  40-70 Heart Healthy Diet <= 40 Therapeutic Level Cholesterol Diet   Picture Your Plate Scores: <30 Unhealthy dietary pattern with much room for improvement. 41-50 Dietary pattern unlikely to meet recommendations for good health and room for improvement. 51-60 More healthful dietary pattern, with some room for improvement.  >60 Healthy dietary pattern, although there may be some specific behaviors that could be improved.    Nutrition Goals Re-Evaluation:   Nutrition Goals Discharge (Final Nutrition Goals Re-Evaluation):   Psychosocial: Target Goals: Acknowledge presence or absence of significant depression and/or stress, maximize coping skills, provide positive support system. Participant is able to verbalize types and ability to use techniques and skills needed for reducing stress and depression.   Education: Stress, Anxiety, and Depression - Group verbal and visual presentation to define topics covered.  Reviews how body is impacted by stress, anxiety, and depression.  Also discusses healthy ways to reduce stress and to treat/manage anxiety and depression.  Written material given at graduation.   Education: Sleep Hygiene -Provides group verbal and written instruction about how sleep can affect your health.  Define sleep hygiene, discuss sleep cycles and impact of sleep habits. Review good sleep hygiene tips.    Initial Review & Psychosocial Screening:  Initial Psych Review & Screening - 10/23/23 1310       Initial Review   Current issues with Current Stress Concerns      Family Dynamics   Good Support System? Yes      Barriers   Psychosocial barriers to participate in program There are no identifiable barriers or psychosocial needs.;The patient should benefit from training in stress management and relaxation.      Screening Interventions   Interventions Encouraged to exercise;Provide  feedback about the scores to participant;To provide support and resources with identified psychosocial needs    Expected Outcomes Short Term goal: Utilizing psychosocial counselor, staff and physician to assist with identification of specific Stressors or current issues interfering with healing process. Setting desired goal for each  stressor or current issue identified.;Long Term Goal: Stressors or current issues are controlled or eliminated.;Short Term goal: Identification and review with participant of any Quality of Life or Depression concerns found by scoring the questionnaire.;Long Term goal: The participant improves quality of Life and PHQ9 Scores as seen by post scores and/or verbalization of changes             Quality of Life Scores:   Scores of 19 and below usually indicate a poorer quality of life in these areas.  A difference of  2-3 points is a clinically meaningful difference.  A difference of 2-3 points in the total score of the Quality of Life Index has been associated with significant improvement in overall quality of life, self-image, physical symptoms, and general health in studies assessing change in quality of life.  PHQ-9: Review Flowsheet  More data exists      11/14/2023 10/24/2023 12/06/2021 10/05/2021 01/28/2020  Depression screen PHQ 2/9  Decreased Interest 0 0 0 0 0  Down, Depressed, Hopeless 0 0 2 0 0  PHQ - 2 Score 0 0 2 0 0  Altered sleeping - 0 1 - -  Tired, decreased energy - 0 0 - -  Change in appetite - 0 0 - -  Feeling bad or failure about yourself  - 0 0 - -  Trouble concentrating - 0 0 - -  Moving slowly or fidgety/restless - 0 0 - -  Suicidal thoughts - 0 0 - -  PHQ-9 Score - 0 3 - -  Difficult doing work/chores - - Somewhat difficult - -   Interpretation of Total Score  Total Score Depression Severity:  1-4 = Minimal depression, 5-9 = Mild depression, 10-14 = Moderate depression, 15-19 = Moderately severe depression, 20-27 = Severe depression    Psychosocial Evaluation and Intervention:  Psychosocial Evaluation - 10/23/23 1321       Psychosocial Evaluation & Interventions   Comments Austine Blunt is coming to cardiac rehab after a stent. 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 She also is being worked up for pulmonary issues and is scheduled for a CT and PFTs to help provide reasons as to why she feels short of breath. She states she feels like she is handling all her medical issues well, but admits it has been a lot on her. She focuses on one day at a time and being thankful for what she has. She has a great support system. She suffers with insomnia and has for years now, so getting good rest if difficult for her.    Expected Outcomes Short: attend cardiac rehab for education and exercise Long: develop and maintain positive self care habits    Continue Psychosocial Services  Follow up required by staff             Psychosocial Re-Evaluation:   Psychosocial Discharge (Final Psychosocial Re-Evaluation):   Vocational Rehabilitation: Provide vocational rehab assistance to qualifying candidates.   Vocational Rehab Evaluation & Intervention:  Vocational Rehab - 10/23/23 1310       Initial Vocational Rehab Evaluation & Intervention   Assessment shows need for Vocational Rehabilitation No             Education: Education Goals: Education classes will be provided on a variety of topics geared toward better understanding of heart health and risk factor modification. Participant will state understanding/return demonstration of topics presented as noted by education  test scores.  Learning Barriers/Preferences:  Learning Barriers/Preferences - 10/23/23 1310       Learning Barriers/Preferences   Learning Barriers None    Learning Preferences None             General Cardiac Education Topics:  AED/CPR: - Group verbal and written  instruction with the use of models to demonstrate the basic use of the AED with the basic ABC's of resuscitation.   Anatomy and Cardiac Procedures: - Group verbal and visual presentation and models provide information about basic cardiac anatomy and function. Reviews the testing methods done to diagnose heart disease and the outcomes of the test results. Describes the treatment choices: Medical Management, Angioplasty, or Coronary Bypass Surgery for treating various heart conditions including Myocardial Infarction, Angina, Valve Disease, and Cardiac Arrhythmias.  Written material given at graduation.   Medication Safety: - Group verbal and visual instruction to review commonly prescribed medications for heart and lung disease. Reviews the medication, class of the drug, and side effects. Includes the steps to properly store meds and maintain the prescription regimen.  Written material given at graduation.   Intimacy: - Group verbal instruction through game format to discuss how heart and lung disease can affect sexual intimacy. Written material given at graduation..   Know Your Numbers and Heart Failure: - Group verbal and visual instruction to discuss disease risk factors for cardiac and pulmonary disease and treatment options.  Reviews associated critical values for Overweight/Obesity, Hypertension, Cholesterol, and Diabetes.  Discusses basics of heart failure: signs/symptoms and treatments.  Introduces Heart Failure Zone chart for action plan for heart failure.  Written material given at graduation.   Infection Prevention: - Provides verbal and written material to individual with discussion of infection control including proper hand washing and proper equipment cleaning during exercise session. Flowsheet Row Cardiac Rehab from 10/24/2023 in [facility]  Date 10/24/23  Educator NT  Instruction Review Code 1- Verbalizes Understanding       Falls Prevention: - Provides  verbal and written material to individual with discussion of falls prevention and safety. Flowsheet Row Cardiac Rehab from 10/24/2023 in [facility] Cardiac and Pulmonary Rehab  Date 10/24/23  Educator NT  Instruction Review Code 1- Verbalizes Understanding       Other: -Provides group and verbal instruction on various topics (see comments)   Knowledge Questionnaire Score:   Core Components/Risk Factors/Patient Goals at Admission:  Personal Goals and Risk Factors at Admission - 10/23/23 1305       Core Components/Risk Factors/Patient Goals on Admission    Weight Management Yes;Weight Loss    Intervention Weight Management: Develop a combined nutrition and exercise program designed to reach desired caloric intake, while maintaining appropriate intake of nutrient and fiber, sodium and fats, and appropriate energy expenditure required for the weight goal.;Weight Management: Provide education and appropriate resources to help participant work on and attain dietary goals.;Weight Management/Obesity: Establish reasonable short term and long term weight goals.    Goal Weight: Long Term 140 lb (63.5 kg)    Expected Outcomes Short Term: Continue to assess and modify interventions until short term weight is achieved;Long Term: Adherence to nutrition and physical activity/exercise program aimed toward attainment of established weight goal;Weight Loss: Understanding of general recommendations for a balanced deficit meal plan, which promotes 1-2 lb weight loss per week and includes a negative energy balance of [phone] kcal/d;Understanding recommendations for meals to include 15-35% energy as protein, 25-35% energy from fat, 35-60% energy from carbohydrates, less than  200mg  of dietary cholesterol, 20-35 gm of total fiber daily;Understanding of distribution of calorie intake throughout the day with the consumption of 4-5 meals/snacks    Diabetes Yes    Intervention Provide education about signs/symptoms and action  to take for hypo/hyperglycemia.;Provide education about proper nutrition, including hydration, and aerobic/resistive exercise prescription along with prescribed medications to achieve blood glucose in normal ranges: Fasting glucose 65-99 mg/dL    Expected Outcomes Short Term: Participant verbalizes understanding of the signs/symptoms and immediate care of hyper/hypoglycemia, proper foot care and importance of medication, aerobic/resistive exercise and nutrition plan for blood glucose control.;Long Term: Attainment of HbA1C < 7%.    Lipids Yes    Intervention Provide education and support for participant on nutrition & aerobic/resistive exercise along with prescribed medications to achieve LDL 70mg , HDL >40mg .    Expected Outcomes Short Term: Participant states understanding of desired cholesterol values and is compliant with medications prescribed. Participant is following exercise prescription and nutrition guidelines.;Long Term: Cholesterol controlled with medications as prescribed, with individualized exercise RX and with personalized nutrition plan. Value goals: LDL < 70mg , HDL > 40 mg.             Education:Diabetes - Individual verbal and written instruction to review signs/symptoms of diabetes, desired ranges of glucose level fasting, after meals and with exercise. Acknowledge that pre and post exercise glucose checks will be done for 3 sessions at entry of program. Flowsheet Row Cardiac Rehab from 10/24/2023 in [facility] Cardiac and Pulmonary Rehab  Date 10/23/23  Educator [facility]  Instruction Review Code 1- Verbalizes Understanding       Core Components/Risk Factors/Patient Goals Review:    Core Components/Risk Factors/Patient Goals at Discharge (Final Review):    ITP Comments:  ITP Comments     Row Name 10/23/23 1302 10/24/23 1126 10/29/23 0954 11/21/23 0934     ITP Comments Initial phone call completed. Diagnosis can be found in [facility] 4/8. EP Orientation scheduled for Wednesday 5/7  9:30. Completed and gym orientation for cardiac rehab. Initial ITP created and sent for review to Dr. Firman Hughes, Medical Director. First full day of exercise!  Patient was oriented to gym and equipment including functions, settings, policies, and procedures.  Patient's individual exercise prescription and treatment plan were reviewed.  All starting workloads were established based on the results of the 6 minute walk test done at initial orientation visit.  The plan for exercise progression was also introduced and progression will be customized based on patient's performance and goals. 30 Day review completed. Medical Director ITP review done, changes made as directed, and signed approval by Medical Director.    new to program             Comments:

## 2023-11-23 ENCOUNTER — Encounter

## 2023-11-26 ENCOUNTER — Encounter: Admitting: *Deleted

## 2023-11-26 DIAGNOSIS — Z955 Presence of coronary angioplasty implant and graft: Secondary | ICD-10-CM

## 2023-11-26 DIAGNOSIS — Z48812 Encounter for surgical aftercare following surgery on the circulatory system: Secondary | ICD-10-CM | POA: Diagnosis not present

## 2023-11-26 NOTE — Progress Notes (Signed)
 Daily Session Note  Patient Details  Name: Brandi Mahoney MRN: [id number] Date of Birth: 07-15-1955 Referring Provider:   Flowsheet Row Cardiac Rehab from 10/24/2023 in [facility] Cardiac and Pulmonary Rehab  Referring Provider Dr. Belva Boyden, MD       Encounter Date: 11/26/2023  Check In:  Session Check In - 11/26/23 0940       Check-In   Supervising physician immediately available to respond to emergencies See telemetry face sheet for immediately available ER MD    Location [facility]-Cardiac & Pulmonary Rehab    Staff Present Maud Sorenson, RN, BSN, CCRP;Maxon Conetta BS, Exercise Physiologist;Meredith Manson Seitz RN,BSN;Kelly [facility]    Virtual Visit No    Medication changes reported     No    Fall or balance concerns reported    No    Warm-up and Cool-down Performed on first and last piece of equipment    Resistance Training Performed Yes    VAD Patient? No    PAD/SET Patient? No      Pain Assessment   Currently in Pain? No/denies                Social History   Tobacco Use  Smoking Status Former   Current packs/day: 0.00   Average packs/day: 0.4 packs/day for 35.0 years (12.3 ttl pk-yrs)   Types: Cigarettes   Start date: 09/25/1973   Quit date: 09/25/2008   Years since quitting: 15.1  Smokeless Tobacco Never  Tobacco Comments   quit in 2007-2008, social smoker    Goals Met:  Independence with exercise equipment Exercise tolerated well No report of concerns or symptoms today  Goals Unmet:  Not Applicable  Comments: Pt able to follow exercise prescription today without complaint.  Will continue to monitor for progression.    Dr. Firman Hughes is Medical Director for [facility] Cardiac Rehabilitation.  Dr. Fuad Aleskerov is Medical Director for [facility] Pulmonary [facility].

## 2023-11-28 ENCOUNTER — Encounter

## 2023-11-28 NOTE — Progress Notes (Signed)
 Cardiology Office Note    Date:  11/29/2023   ID:  Brandi Mahoney, DOB 1956/03/19, MRN [id number]  PCP:  Scherrie Curt, MD  Cardiologist:  Belva Boyden, MD  Electrophysiologist:  None   Chief Complaint: Follow up  History of Present Illness:   Brandi Mahoney is a 68 y.o. 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  She was admitted in 2011 with chest pain and elevated troponin.  Cardiac cath showed mild CAD.  Presentation was felt to be secondary to viral myocarditis.  Echo in 09/2017 showed an EF of 65 to 70% with no significant valvular disease.  Nuclear stress test in 10/2019 was without ischemia.  Previously followed by Dr. Gollan, last seeing him in 2022.  She was seen in the ED in 08/2023 with chest pain.  Troponin negative.  EKG showed sinus rhythm with a new left bundle branch block.  CTA chest was negative for PE or aortic dissection.  She followed up with Dr. Abel Hoe in 09/2023 noting progressive exertional chest pressure and dyspnea, concerning for unstable angina.  In this setting she underwent LHC on 09/25/2023 that showed 85% stenosis in the proximal LAD which was treated successfully with PCI/DES.  In the setting of arteriotomy site discomfort she underwent ultrasound of the right upper extremity which showed a pseudoaneurysm measuring 3 x 1 cm that was not amenable to thrombin injection.  She underwent ultrasound-guided compression without resolution or thrombosis.  She was evaluated by vascular surgery and underwent ulnar pseudoaneurysm repair without complications.  Echo in 10/2023 showed an EF of 55 to 60%, no regional wall motion abnormalities, grade 1 diastolic dysfunction, normal RV systolic function, ventricular cavity size, and RVSP, moderate mitral valve regurgitation, and an estimated  right atrial pressure of 8 mmHg.  She followed up with vascular surgery with repeat right upper extremity arterial duplex showing no obstruction visualized in the right upper extremity with widely patent right ulnar artery with no further intervention indicated.  She comes in doing well from a cardiac perspective and is without symptoms of angina or cardiac decompensation.  No dizziness, presyncope, or syncope.  No falls, hematochezia, or melena.  She remains adherent and is tolerating DAPT without missing any doses.  She does report a longstanding history of dyspnea and a dry cough and is undergoing evaluation by pulmonology for this.  High-resolution chest CT was without evidence of ILD with findings consistent with small airway disease, LAD calcification, aortic atherosclerosis, and enlarged left and right pulmonary arteries.  No further complications from right radial arteriotomy site.   Labs independently reviewed: 09/2023 - LP(a) 20, Hgb 11.7, PLT 277, potassium 3.6, BUN 13, serum creatinine 0.79 07/2023 - A1c 6.4 11/2022 - direct LDL 62, TC 124, TG 250, HDL 40, albumin  4.6, AST 42, ALT 44, TSH normal  Past Medical History:  Diagnosis Date   CAD (coronary artery disease)    a. 01/2010 Cath: mild atherosclerosis in the mid LCX after takeoff of OM1, mild diff LAD dzs;  b. 07/2013 MV: EF 67%, no ischemia.   GAD (generalized anxiety disorder)    History of kidney stones    HLD (hyperlipidemia)    Hypothyroidism    Major depressive disorder, recurrent episode, in full remission (HCC) 09/08/2008   Qualifier: Diagnosis of  By: Geralyn Knee MD, Jolena Nay  Migraine headache    Myocarditis (HCC)    a. 01/2010 presented w/ c/p and elev trop-->Cath: mild atherosclerosis in the mid LCX after takeoff of OM1, mild diff LAD dzs;  b. 03/2010 Echo: EF 60-65%, DD, mild LVH;  c. 07/2011 Echo: EF 55-60%, mild LVH, no rwma.   Sleep apnea    has c-pap.  wears off and on - not lately  using  per reported 07-29-2020   Type  2 diabetes, diet controlled [facility])     Past Surgical History:  Procedure Laterality Date   APPENDECTOMY  1974   BREAST SURGERY Right 1988   fatty tumore from breast   CARDIAC CATHETERIZATION  01/2010   no [facility] Dr Jerelene Monday,   CHOLECYSTECTOMY  2002   COLONOSCOPY  2017   CORONARY STENT INTERVENTION N/A 09/25/2023   Procedure: CORONARY STENT INTERVENTION;  Surgeon: Swaziland, Peter M, MD;  Location: [facility] INVASIVE CV LAB;  Service: Cardiovascular;  Laterality: N/A;   CYST EXCISION     ovarian cyst 1994   INSERTION OF ARTERIOVENOUS (AV) ARTEGRAFT ARM Right 09/27/2023   Procedure: 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;  Surgeon: Philipp Brawn, MD;  Location[facility] OR;  Service: Vascular;  Laterality: Right;   LEFT HEART CATH AND CORONARY ANGIOGRAPHY N/A 09/25/2023   Procedure: LEFT HEART CATH AND CORONARY ANGIOGRAPHY;  Surgeon: Swaziland, Peter M, MD;  Location: [facility] INVASIVE CV LAB;  Service: Cardiovascular;  Laterality: N/A;   NEPHROLITHOTOMY Right 08/20/2014   Procedure: RIGHT PERCUTANEOUS NEPHROLITHOTOMY ;  Surgeon: Willye Harvey, MD;  Location: [facility] ORS;  Service: Urology;  Laterality: Right;   POLYPECTOMY     RECTAL EXAM UNDER ANESTHESIA N/A 08/02/2020   Procedure: Wilkie Harding EXAM UNDER ANESTHESIA INJECTION OF PERIANAL BOTOX ;  Surgeon: Melvenia Stabs, MD;  Location: [facility];  Service: General;  Laterality: N/A;   TOTAL ABDOMINAL HYSTERECTOMY W/ BILATERAL SALPINGOOPHORECTOMY  2000   no ovaries, uterus, or cervix.    Current Medications: Current Meds  Medication Sig   Accu-Chek Softclix Lancets lancets Use to check blood sugar daily   albuterol  (VENTOLIN  HFA) 108 (90 Base) MCG/ACT inhaler Inhale 2 puffs into the lungs every 6 (six) hours as needed for wheezing or shortness of breath.   aspirin  EC 81 MG tablet Take 81 mg by mouth daily.   clobetasol  ointment (TEMOVATE ) 0.05 % Apply 1 Application topically daily as needed (skin conditions).    fenofibrate  160 MG tablet Take 1 tablet (160 mg total) by mouth daily.   fluticasone (FLONASE) 50 MCG/ACT nasal spray Place 2 sprays into both nostrils in the morning and at bedtime.   gabapentin  (NEURONTIN ) 300 MG capsule Take 300 mg by mouth at bedtime.   glucose blood (ACCU-CHEK GUIDE) test strip Use to check blood sugar daily   Lactobacillus-Inulin (CULTURELLE ADULT ULT BALANCE PO) Take 1 tablet by mouth daily.   levothyroxine  (SYNTHROID ) 75 MCG tablet TAKE ONE TABLET BY MOUTH EVERY MORNING BEFORE BREAKFAST   Magnesium  250 MG TABS Take 250 mg by mouth daily.   metFORMIN  (GLUCOPHAGE -XR) 500 MG 24 hr tablet Take 1,000 mg by mouth 2 (two) times daily with a meal.   Multiple Vitamins-Minerals (ONE-A-DAY WOMENS PETITES PO) Take 1 tablet by mouth daily.   nitroGLYCERIN  (NITROSTAT ) 0.4 MG SL tablet Place 1 tablet under tongue at onset of chest pain; you may repeat every 5 minutes for up to 3 doses.   pantoprazole  (PROTONIX ) 40 MG tablet Take 1 tablet (40 mg total) by mouth  daily.   prasugrel  (EFFIENT ) 10 MG TABS tablet Take 1 tablet (10 mg total) by mouth daily.   rosuvastatin  (CRESTOR ) 40 MG tablet Take 1 tablet (40 mg total) by mouth daily.   sertraline  (ZOLOFT ) 50 MG tablet TAKE 1 TABLET BY MOUTH DAILY   traMADol (ULTRAM) 50 MG tablet Take 50 mg by mouth 2 (two) times daily as needed.   traZODone  (DESYREL ) 100 MG tablet TAKE 1 TABLET BY MOUTH AT BEDTIME    Allergies:   Cat dander, Pseudoeph-doxylamine-dm-apap, Gluten, and Penicillins   Social History   Socioeconomic History   Marital status: Married    Spouse name: Baker Bon   Number of children: 3   Years of education: Not on file   Highest education level: Not on file  Occupational History   Occupation: retired    Comment: [employer]  Tobacco Use   Smoking status: Former    Current packs/day: 0.00    Average packs/day: 0.4 packs/day for 35.0 years (12.3 ttl pk-yrs)    Types: Cigarettes    Start date: 09/25/1973    Quit date: 09/25/2008     Years since quitting: 15.1   Smokeless tobacco: Never   Tobacco comments:    quit in 2007-2008, social smoker  Vaping Use   Vaping status: Never Used  Substance and Sexual Activity   Alcohol use: Not Currently   Drug use: No   Sexual activity: Not Currently  Other Topics Concern   Not on file  Social History Narrative   Lives in [place] w/ husband.  No reg exercise.   Social Drivers of Corporate investment banker Strain: Not on file  Food Insecurity: No Food Insecurity (09/26/2023)   Hunger Vital Sign    Worried About Running Out of Food in the Last Year: Never true    Ran Out of Food in the Last Year: Never true  Transportation Needs: No Transportation Needs (09/26/2023)   PRAPARE - Administrator, Civil Service (Medical): No    Lack of Transportation (Non-Medical): No  Physical Activity: Not on file  Stress: Not on file  Social Connections: Socially Integrated (09/26/2023)   Social Connection and Isolation Panel    Frequency of Communication with Friends and Family: More than three times a week    Frequency of Social Gatherings with Friends and Family: Once a week    Attends Religious Services: More than 4 times per year    Active Member of [employer] or Organizations: Yes    Attends Engineer, structural: More than 4 times per year    Marital Status: Married     Family History:  The patient's family history includes ALS in her mother; Breast cancer in her paternal grandmother; Breast cancer (age of onset: 23) in her sister; Colon cancer in her maternal grandfather; Colon cancer (age of onset: 66) in her son; Coronary artery disease in her mother; Diabetes in her mother; Down syndrome in her son; Hyperlipidemia in her father; Lung cancer in her paternal grandfather; Other in her daughter; Stomach cancer in her maternal grandfather. There is no history of Esophageal cancer, Liver cancer, Pancreatic cancer, or Rectal cancer.  ROS:   12-point review of systems is  negative unless otherwise noted in the HPI.   EKGs/Labs/Other Studies Reviewed:    Studies reviewed were summarized above. The additional studies were reviewed today:  2D echo 10/23/2023: 1. Left ventricular ejection fraction, by estimation, is 55 to 60%. Left  ventricular ejection fraction by 3D  volume is 63 %. The left ventricle has  normal function. The left ventricle has no regional wall motion  abnormalities. Left ventricular diastolic   parameters are consistent with Grade I diastolic dysfunction (impaired  relaxation).   2. Right ventricular systolic function is normal. The right ventricular  size is normal. There is normal pulmonary artery systolic pressure. The  estimated right ventricular systolic pressure is 33.0 mmHg.   3. Left atrial size was mild to moderately dilated.   4. The mitral valve is normal in structure. Moderate mitral valve  regurgitation. No evidence of mitral stenosis.   5. The aortic valve has an indeterminant number of cusps. Aortic valve  regurgitation is not visualized. No aortic stenosis is present.   6. The inferior vena cava is dilated in size with >50% respiratory  variability, suggesting right atrial pressure of 8 mmHg.  __________  LHC 09/25/2023: Diagnostic Dominance: Right Left Main  Vessel was injected. Vessel is normal in caliber. Vessel is angiographically normal.    Left Anterior Descending  Prox LAD lesion is 85% stenosed. The lesion is focal.    Left Circumflex  Vessel was injected. Vessel is normal in caliber. The vessel exhibits minimal luminal irregularities.    Right Coronary Artery  Vessel was injected. Vessel is large. Vessel is angiographically normal.       Single vessel obstructive CAD involving the proximal LAD Normal LV function Normal LVEDP Successful PCI of the proximal LAD with DES x 1   Plan: DAPT for one year. Anticipate same day DC __________  Lexiscan  MPI 11/08/2018: Pharmacological myocardial perfusion  imaging study with no significant  Ischemia Breast attenuation artifact noted Normal wall motion, EF could not be estimated secondary to technical issues No EKG changes concerning for ischemia at peak stress or in recovery. Low risk scan __________  2D echo 10/05/2017: - Left ventricle: The cavity size was normal. There was mild    concentric hypertrophy. Systolic function was vigorous. The    estimated ejection fraction was in the range of 65% to 70%. Wall    motion was normal; there were no regional wall motion    abnormalities. Doppler parameters are consistent with abnormal    left ventricular relaxation (grade 1 diastolic dysfunction).  __________  See CV Studies in Epic for more remote cardiac imaging   EKG:  EKG is not ordered today.    Recent Labs: 09/26/2023: BUN 13; Creatinine, Ser 0.79; Hemoglobin 11.7; Platelets 277; Potassium 3.6; Sodium 138  Recent Lipid Panel    Component Value Date/Time   CHOL 124 11/22/2022 1558   CHOL 151 10/17/2011 0826   TRIG 250.0 (H) 11/22/2022 1558   TRIG 136 10/17/2011 0826   HDL 40.10 11/22/2022 1558   HDL 44 10/17/2011 0826   CHOLHDL 3 11/22/2022 1558   VLDL 50.0 (H) 11/22/2022 1558   VLDL 27 10/17/2011 0826   LDLCALC 56 09/28/2021 1034   LDLCALC 80 10/17/2011 0826   LDLDIRECT 62.0 11/22/2022 1558    PHYSICAL EXAM:    VS:  BP 138/72 (BP Location: Left Arm, Patient Position: Sitting, Cuff Size: Normal)   Pulse 95   Ht 5' 4 (1.626 m)   Wt 157 lb 9.6 oz (71.5 kg)   SpO2 94%   BMI 27.05 kg/m   BMI: Body mass index is 27.05 kg/m.  Physical Exam Vitals reviewed.  Constitutional:      Appearance: She is well-developed.  HENT:     Head: Normocephalic and atraumatic.   Eyes:  General:        Right eye: No discharge.        Left eye: No discharge.    Cardiovascular:     Rate and Rhythm: Normal rate and regular rhythm.     Heart sounds: S1 normal and S2 normal. Heart sounds not distant. No midsystolic click and no  opening snap. Murmur heard.     Systolic murmur is present with a grade of 2/6 at the upper left sternal border.     No friction rub.  Pulmonary:     Effort: Pulmonary effort is normal. No respiratory distress.     Breath sounds: Normal breath sounds. No decreased breath sounds, wheezing, rhonchi or rales.  Chest:     Chest wall: No tenderness.   Musculoskeletal:     Cervical back: Normal range of motion.   Skin:    General: Skin is warm and dry.     Nails: There is no clubbing.   Neurological:     Mental Status: She is alert and oriented to person, place, and time.   Psychiatric:        Speech: Speech normal.        Behavior: Behavior normal.        Thought Content: Thought content normal.        Judgment: Judgment normal.     Wt Readings from Last 3 Encounters:  11/29/23 157 lb 9.6 oz (71.5 kg)  11/16/23 158 lb (71.7 kg)  11/14/23 157 lb (71.2 kg)     ASSESSMENT & PLAN:   CAD involving native coronary arteries without angina: She is doing well and without symptoms concerning for angina or cardiac decompensation.  Continue aggressive risk factor modification and secondary prevention including DAPT with aspirin  81 mg and Effient  10 mg daily without interruption for minimum of 12 months dating back to date of PCI (09/25/2023).  Continue cardiac rehab.  She otherwise remains on fenofibrate , rosuvastatin , and as needed SL NTG.  Check CBC and CMP.  HLD/hypertriglyceridemia: LDL 62 in 11/2022.  Future orders placed for fasting lipid panel with direct LDL.  For now remains on fenofibrate  160 mg and rosuvastatin  40 mg.  Right radial artery pseudoaneurysm: Status post surgical repair.  No new acute issues.  Chronic dyspnea and cough: Currently undergoing evaluation by pulmonology with high-resolution chest CT showing no evidence of ILD.  Findings notable for enlarged right and left pulmonary arteries, which can be seen in pulmonary arterial hypertension.  However, recent echo showed  normal RVSP and high normal right atrial pressure.  If symptoms persist or worsen, could consider RHC for further evaluation.  Await further pulmonology evaluation.     Disposition: F/u with Dr. Gollan or an APP in 1 month.   Medication Adjustments/Labs and Tests Ordered: Current medicines are reviewed at length with the patient today.  Concerns regarding medicines are outlined above. Medication changes, Labs and Tests ordered today are summarized above and listed in the Patient Instructions accessible in Encounters.   Signed, Varney Gentleman, PA-C 11/29/2023 4:05 PM     [facility] [place] [address] [place], [place] [address] [phone]

## 2023-11-29 ENCOUNTER — Ambulatory Visit: Attending: Physician Assistant | Admitting: Physician Assistant

## 2023-11-29 ENCOUNTER — Encounter: Payer: Self-pay | Admitting: Physician Assistant

## 2023-11-29 VITALS — BP 138/72 | HR 95 | Ht 64.0 in | Wt 157.6 lb

## 2023-11-29 DIAGNOSIS — E785 Hyperlipidemia, unspecified: Secondary | ICD-10-CM

## 2023-11-29 DIAGNOSIS — I2510 Atherosclerotic heart disease of native coronary artery without angina pectoris: Secondary | ICD-10-CM

## 2023-11-29 DIAGNOSIS — Z79899 Other long term (current) drug therapy: Secondary | ICD-10-CM

## 2023-11-29 DIAGNOSIS — E781 Pure hyperglyceridemia: Secondary | ICD-10-CM

## 2023-11-29 DIAGNOSIS — R0609 Other forms of dyspnea: Secondary | ICD-10-CM | POA: Diagnosis not present

## 2023-11-29 DIAGNOSIS — I729 Aneurysm of unspecified site: Secondary | ICD-10-CM | POA: Diagnosis not present

## 2023-11-29 NOTE — Patient Instructions (Signed)
 Medication Instructions:   Your physician recommends that you continue on your current medications as directed. Please refer to the Current Medication list given to you today.   *If you need a refill on your cardiac medications before your next appointment, please call your pharmacy*  Lab Work: Your provider would like for you to return in the morning to have the following labs drawn: CBC, CMET, LIPID PANEL, DIRECT LDL.   Please go to [facility] [address] ([facility] [address], [place] [address] You do not need an appointment.  They are open from 8 am- 4:30 pm.  Lunch from 1:00 pm- 2:00 pm You need to be fasting.  If you have labs (blood work) drawn today and your tests are completely normal, you will receive your results only by: MyChart Message (if you have MyChart) OR A paper copy in the mail If you have any lab test that is abnormal or we need to change your treatment, we will call you to review the results.  Testing/Procedures: No test ordered today   Follow-Up: At [facility], you and your health needs are our priority.  As part of our continuing mission to provide you with exceptional heart care, our providers are all part of one team.  This team includes your primary Cardiologist (physician) and Advanced Practice Providers or APPs (Physician Assistants and Nurse Practitioners) who all work together to provide you with the care you need, when you need it.  Your next appointment:   1 month(s)  Provider:   Timothy Gollan, MD or Varney Gentleman, PA-C

## 2023-11-30 ENCOUNTER — Ambulatory Visit: Payer: Self-pay | Admitting: Pulmonary Disease

## 2023-11-30 ENCOUNTER — Encounter: Admitting: *Deleted

## 2023-11-30 DIAGNOSIS — Z955 Presence of coronary angioplasty implant and graft: Secondary | ICD-10-CM

## 2023-11-30 DIAGNOSIS — Z79899 Other long term (current) drug therapy: Secondary | ICD-10-CM | POA: Diagnosis not present

## 2023-11-30 DIAGNOSIS — Z48812 Encounter for surgical aftercare following surgery on the circulatory system: Secondary | ICD-10-CM | POA: Diagnosis not present

## 2023-11-30 NOTE — Progress Notes (Signed)
 Daily Session Note  Patient Details  Name: Brandi Mahoney MRN: [id number] Date of Birth: [birth year] Referring Provider:   Flowsheet Row Cardiac Rehab from 10/24/2023 in [facility] Cardiac and Pulmonary Rehab  Referring Provider Dr. Belva Boyden, MD    Encounter Date: 11/30/2023  Check In:  Session Check In - 11/30/23 0938       Check-In   Supervising physician immediately available to respond to emergencies See telemetry face sheet for immediately available ER MD    Location [facility]-Cardiac & Pulmonary Rehab    Staff Present Maud Sorenson, RN, BSN, CCRP;Meredith Manson Seitz RN,BSN;Maxon [place] BS, Exercise Physiologist;Noah Tickle, BS, Exercise Physiologist    Virtual Visit No    Medication changes reported     No    Fall or balance concerns reported    No    Warm-up and Cool-down Performed on first and last piece of equipment    Resistance Training Performed Yes    VAD Patient? No    PAD/SET Patient? No      Pain Assessment   Currently in Pain? No/denies             Social History   Tobacco Use  Smoking Status Former   Current packs/day: 0.00   Average packs/day: 0.4 packs/day for 35.0 years (12.3 ttl pk-yrs)   Types: Cigarettes   Start date: 09/25/1973   Quit date: 09/25/2008   Years since quitting: 15.1  Smokeless Tobacco Never  Tobacco Comments   quit in 2007-2008, social smoker    Goals Met:  Independence with exercise equipment Exercise tolerated well No report of concerns or symptoms today  Goals Unmet:  Not Applicable  Comments: Pt able to follow exercise prescription today without complaint.  Will continue to monitor for progression.    Dr. Firman Hughes is Medical Director for [facility] Cardiac Rehabilitation.  Dr. Fuad Aleskerov is Medical Director for [facility] Pulmonary Rehabilitation.

## 2023-12-01 LAB — COMPREHENSIVE METABOLIC PANEL WITH GFR
ALT: 18 IU/L (ref 0–32)
AST: 23 IU/L (ref 0–40)
Albumin: 4.6 g/dL (ref 3.9–4.9)
Alkaline Phosphatase: 66 IU/L (ref 44–121)
BUN/Creatinine Ratio: 17 (ref 12–28)
BUN: 12 mg/dL (ref 8–27)
Bilirubin Total: 0.3 mg/dL (ref 0.0–1.2)
CO2: 22 mmol/L (ref 20–29)
Calcium: 10.2 mg/dL (ref 8.7–10.3)
Chloride: 102 mmol/L (ref 96–106)
Creatinine, Ser: 0.71 mg/dL (ref 0.57–1.00)
Globulin, Total: 2 g/dL (ref 1.5–4.5)
Glucose: 104 mg/dL — ABNORMAL HIGH (ref 70–99)
Potassium: 4.6 mmol/L (ref 3.5–5.2)
Sodium: 142 mmol/L (ref 134–144)
Total Protein: 6.6 g/dL (ref 6.0–8.5)
eGFR: 93 mL/min/{1.73_m2} (ref >59)

## 2023-12-01 LAB — CBC
Hematocrit: 37.5 % (ref 34.0–46.6)
Hemoglobin: 11.8 g/dL (ref 11.1–15.9)
MCH: 26.5 pg — ABNORMAL LOW (ref 26.6–33.0)
MCHC: 31.5 g/dL (ref 31.5–35.7)
MCV: 84 fL (ref 79–97)
Platelets: 269 10*3/uL (ref 150–450)
RBC: 4.46 x10E6/uL (ref 3.77–5.28)
RDW: 13.6 % (ref 11.7–15.4)
WBC: 8.1 10*3/uL (ref 3.4–10.8)

## 2023-12-01 LAB — LIPID PANEL
Chol/HDL Ratio: 2.2 ratio (ref 0.0–4.4)
Cholesterol, Total: 80 mg/dL — ABNORMAL LOW (ref 100–199)
HDL: 36 mg/dL — ABNORMAL LOW (ref >39)
LDL Chol Calc (NIH): 21 mg/dL (ref 0–99)
Triglycerides: 130 mg/dL (ref 0–149)
VLDL Cholesterol Cal: 23 mg/dL (ref 5–40)

## 2023-12-01 LAB — LDL CHOLESTEROL, DIRECT: LDL Direct: 19 mg/dL (ref 0–99)

## 2023-12-03 ENCOUNTER — Ambulatory Visit: Payer: Self-pay | Admitting: Physician Assistant

## 2023-12-03 ENCOUNTER — Encounter: Admitting: *Deleted

## 2023-12-03 DIAGNOSIS — Z48812 Encounter for surgical aftercare following surgery on the circulatory system: Secondary | ICD-10-CM | POA: Diagnosis not present

## 2023-12-03 DIAGNOSIS — Z955 Presence of coronary angioplasty implant and graft: Secondary | ICD-10-CM | POA: Diagnosis not present

## 2023-12-03 NOTE — Progress Notes (Signed)
 Daily Session Note  Patient Details  Name: Brandi Mahoney MRN: [id number] Date of Birth: Mar 17, 1956 Referring Provider:   Flowsheet Row Cardiac Rehab from 10/24/2023 in [facility] Cardiac and Pulmonary Rehab  Referring Provider Dr. Belva Boyden, MD    Encounter Date: 12/03/2023  Check In:  Session Check In - 12/03/23 0937       Check-In   Supervising physician immediately available to respond to emergencies See telemetry face sheet for immediately available ER MD    Location [facility]-Cardiac & Pulmonary Rehab    Staff Present Maud Sorenson, RN, BSN, CCRP;Joseph Hood RCP,RRT,BSRT;Maxon [place] BS, Exercise Physiologist;Kelly [employer], ACSM CEP, Exercise Physiologist    Virtual Visit No    Medication changes reported     No    Fall or balance concerns reported    No    Warm-up and Cool-down Performed on first and last piece of equipment    Resistance Training Performed Yes    VAD Patient? No    PAD/SET Patient? No      Pain Assessment   Currently in Pain? No/denies             Social History   Tobacco Use  Smoking Status Former   Current packs/day: 0.00   Average packs/day: 0.4 packs/day for 35.0 years (12.3 ttl pk-yrs)   Types: Cigarettes   Start date: 09/25/1973   Quit date: 09/25/2008   Years since quitting: 15.1  Smokeless Tobacco Never  Tobacco Comments   quit in 2007-2008, social smoker    Goals Met:  Independence with exercise equipment Exercise tolerated well No report of concerns or symptoms today  Goals Unmet:  Not Applicable  Comments: Pt able to follow exercise prescription today without complaint.  Will continue to monitor for progression.    Dr. Firman Hughes is Medical Director for [facility] Cardiac Rehabilitation.  Dr. Fuad Aleskerov is Medical Director for [facility] Pulmonary Rehabilitation.

## 2023-12-05 ENCOUNTER — Encounter

## 2023-12-05 DIAGNOSIS — M51369 Other intervertebral disc degeneration, lumbar region without mention of lumbar back pain or lower extremity pain: Secondary | ICD-10-CM | POA: Diagnosis not present

## 2023-12-05 DIAGNOSIS — G250 Essential tremor: Secondary | ICD-10-CM | POA: Diagnosis not present

## 2023-12-05 DIAGNOSIS — G43019 Migraine without aura, intractable, without status migrainosus: Secondary | ICD-10-CM | POA: Diagnosis not present

## 2023-12-07 ENCOUNTER — Encounter: Admitting: *Deleted

## 2023-12-07 DIAGNOSIS — Z955 Presence of coronary angioplasty implant and graft: Secondary | ICD-10-CM | POA: Diagnosis not present

## 2023-12-07 DIAGNOSIS — Z48812 Encounter for surgical aftercare following surgery on the circulatory system: Secondary | ICD-10-CM | POA: Diagnosis not present

## 2023-12-07 NOTE — Progress Notes (Signed)
 Daily Session Note  Patient Details  Name: Brandi Mahoney MRN: [id number] Date of Birth: 07-Oct-1955 Referring Provider:   Flowsheet Row Cardiac Rehab from 10/24/2023 in [facility] Cardiac and Pulmonary Rehab  Referring Provider Dr. Belva Boyden, MD    Encounter Date: 12/07/2023  Check In:  Session Check In - 12/07/23 0925       Check-In   Supervising physician immediately available to respond to emergencies See telemetry face sheet for immediately available ER MD    Location [facility]-Cardiac & Pulmonary Rehab    Staff Present Maud Sorenson, RN, BSN, CCRP;Joseph Hood RCP,RRT,BSRT;Maxon [place] BS, Exercise Physiologist;Noah Tickle, BS, Exercise Physiologist    Virtual Visit No    Medication changes reported     No    Fall or balance concerns reported    No    Warm-up and Cool-down Performed on first and last piece of equipment    Resistance Training Performed Yes    VAD Patient? No    PAD/SET Patient? No      Pain Assessment   Currently in Pain? No/denies             Social History   Tobacco Use  Smoking Status Former   Current packs/day: 0.00   Average packs/day: 0.4 packs/day for 35.0 years (12.3 ttl pk-yrs)   Types: Cigarettes   Start date: 09/25/1973   Quit date: 09/25/2008   Years since quitting: 15.2  Smokeless Tobacco Never  Tobacco Comments   quit in 2007-2008, social smoker    Goals Met:  Independence with exercise equipment Exercise tolerated well No report of concerns or symptoms today  Goals Unmet:  Not Applicable  Comments: Pt able to follow exercise prescription today without complaint.  Will continue to monitor for progression.    Dr. Firman Hughes is Medical Director for [facility] Cardiac Rehabilitation.  Dr. Fuad Aleskerov is Medical Director for [facility] Pulmonary Rehabilitation.

## 2023-12-10 ENCOUNTER — Encounter: Admitting: *Deleted

## 2023-12-10 DIAGNOSIS — Z48812 Encounter for surgical aftercare following surgery on the circulatory system: Secondary | ICD-10-CM | POA: Diagnosis not present

## 2023-12-10 DIAGNOSIS — Z955 Presence of coronary angioplasty implant and graft: Secondary | ICD-10-CM

## 2023-12-10 NOTE — Progress Notes (Signed)
 Daily Session Note  Patient Details  Name: Brandi Mahoney MRN: [id number] Date of Birth: May 26, 1956 Referring Provider:   Flowsheet Row Cardiac Rehab from 10/24/2023 in [facility] Cardiac and Pulmonary Rehab  Referring Provider Dr. Evalene Lunger, MD    Encounter Date: 12/10/2023  Check In:  Session Check In - 12/10/23 0935       Check-In   Supervising physician immediately available to respond to emergencies See telemetry face sheet for immediately available ER MD    Location [facility]-Cardiac & Pulmonary Rehab    Staff Present Othel Durand, RN, BSN, CCRP;Kelly Dyane BS, ACSM CEP, Exercise Physiologist;Kelly Bollinger [facility]    Virtual Visit No    Medication changes reported     No    Fall or balance concerns reported    No    Warm-up and Cool-down Performed on first and last piece of equipment    Resistance Training Performed Yes    VAD Patient? No    PAD/SET Patient? No      Pain Assessment   Currently in Pain? No/denies             Social History   Tobacco Use  Smoking Status Former   Current packs/day: 0.00   Average packs/day: 0.4 packs/day for 35.0 years (12.3 ttl pk-yrs)   Types: Cigarettes   Start date: 09/25/1973   Quit date: 09/25/2008   Years since quitting: 15.2  Smokeless Tobacco Never  Tobacco Comments   quit in 2007-2008, social smoker    Goals Met:  Independence with exercise equipment Exercise tolerated well No report of concerns or symptoms today  Goals Unmet:  Not Applicable  Comments: Pt able to follow exercise prescription today without complaint.  Will continue to monitor for progression.    Dr. Oneil Pinal is Medical Director for [facility] Cardiac Rehabilitation.  Dr. Fuad Aleskerov is Medical Director for [facility] Pulmonary Rehabilitation.

## 2023-12-11 ENCOUNTER — Emergency Department

## 2023-12-11 ENCOUNTER — Other Ambulatory Visit: Payer: Self-pay

## 2023-12-11 ENCOUNTER — Emergency Department
Admission: EM | Admit: 2023-12-11 | Discharge: 2023-12-11 | Disposition: A | Discharge status: Home / Self Care | Attending: Emergency Medicine | Admitting: Emergency Medicine

## 2023-12-11 DIAGNOSIS — R0789 Other chest pain: Secondary | ICD-10-CM | POA: Insufficient documentation

## 2023-12-11 DIAGNOSIS — I2510 Atherosclerotic heart disease of native coronary artery without angina pectoris: Secondary | ICD-10-CM | POA: Insufficient documentation

## 2023-12-11 DIAGNOSIS — R079 Chest pain, unspecified: Secondary | ICD-10-CM | POA: Diagnosis not present

## 2023-12-11 LAB — BASIC METABOLIC PANEL WITH GFR
Anion gap: 11 (ref 5–15)
BUN: 16 mg/dL (ref 8–23)
CO2: 23 mmol/L (ref 22–32)
Calcium: 9.5 mg/dL (ref 8.9–10.3)
Chloride: 103 mmol/L (ref 98–111)
Creatinine, Ser: 0.61 mg/dL (ref 0.44–1.00)
GFR, Estimated: >60 mL/min (ref >60)
Glucose, Bld: 147 mg/dL — ABNORMAL HIGH (ref 70–99)
Potassium: 3.7 mmol/L (ref 3.5–5.1)
Sodium: 137 mmol/L (ref 135–145)

## 2023-12-11 LAB — CBC
HCT: 36.2 % (ref 36.0–46.0)
Hemoglobin: 11.9 g/dL — ABNORMAL LOW (ref 12.0–15.0)
MCH: 26.9 pg (ref 26.0–34.0)
MCHC: 32.9 g/dL (ref 30.0–36.0)
MCV: 81.7 fL (ref 80.0–100.0)
Platelets: 325 10*3/uL (ref 150–400)
RBC: 4.43 MIL/uL (ref 3.87–5.11)
RDW: 14.3 % (ref 11.5–15.5)
WBC: 7.8 10*3/uL (ref 4.0–10.5)
nRBC: 0 % (ref 0.0–0.2)

## 2023-12-11 LAB — TROPONIN I (HIGH SENSITIVITY)
Troponin I (High Sensitivity): 5 ng/L (ref <18)
Troponin I (High Sensitivity): 3 ng/L (ref <18)

## 2023-12-11 LAB — D-DIMER, QUANTITATIVE: D-Dimer, Quant: <0.27 ug{FEU}/mL (ref 0.00–0.50)

## 2023-12-11 MED ORDER — LIDOCAINE 5 % EX PTCH
1.0000 | MEDICATED_PATCH | CUTANEOUS | Status: DC
  Administered 2023-12-11: 1 via TRANSDERMAL
  Filled 2023-12-11: qty 1

## 2023-12-11 MED ORDER — ACETAMINOPHEN 500 MG PO TABS
1000.0000 mg | ORAL_TABLET | Freq: Once | ORAL | Status: AC
  Administered 2023-12-11: 1000 mg via ORAL
  Filled 2023-12-11: qty 2

## 2023-12-11 NOTE — ED Triage Notes (Addendum)
 Pt to ED via POV from home c/o left sided cp. Started having shooting pains earlier this morning. Shooting pains have become more consistent throughout the day.Radiates to left shoulder.  Endorses more SOB than usual. Takes blood thinner

## 2023-12-11 NOTE — ED Provider Notes (Signed)
 [facility] Provider Note    Event Date/Time   First MD Initiated Contact with Patient 12/11/23 2035     (approximate)   History   Chest Pain   HPI  SHEMIKA Mahoney is a 68 y.o. female who presents to the ED for evaluation of Chest Pain   I review left heart cath from April.  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  Stronger sensation when taking a deep breath.  No syncope, intermittent lightheaded dizziness is present.  Compliant with her Effient  without anticoagulation and no history of VTE   Physical Exam   Triage Vital Signs: ED Triage Vitals  Encounter Vitals Group     BP 12/11/23 1938 (!) 147/65     Girls Systolic BP Percentile --      Girls Diastolic BP Percentile --      Boys Systolic BP Percentile --      Boys Diastolic BP Percentile --      Pulse Rate 12/11/23 1938 87     Resp 12/11/23 1938 18     Temp 12/11/23 1938 98.4 F (36.9 C)     Temp Source 12/11/23 1938 Oral     SpO2 --      Weight 12/11/23 1934 157 lb (71.2 kg)     Height 12/11/23 1934 5' 4 (1.626 m)     Head Circumference --      Peak Flow --      Pain Score 12/11/23 1934 3     Pain Loc --      Pain Education --      Exclude from Growth Chart --     Most recent vital signs: Vitals:   12/11/23 1938  BP: (!) 147/65  Pulse: 87  Resp: 18  Temp: 98.4 F (36.9 C)    General: Awake, no distress.  CV:  Good peripheral perfusion.  Resp:  Normal effort.  Abd:  No distention.  MSK:  No deformity noted.  Reproducible chest discomfort is noted, no overlying skin changes or signs of trauma Neuro:  No focal deficits appreciated. Other:     ED Results / Procedures / Treatments   Labs (all labs ordered are listed, but only abnormal results are displayed) Labs Reviewed  BASIC METABOLIC PANEL WITH GFR - Abnormal; Notable for the following  components:      Result Value   Glucose, Bld 147 (*)    All other components within normal limits  CBC - Abnormal; Notable for the following components:   Hemoglobin 11.9 (*)    All other components within normal limits  D-DIMER, QUANTITATIVE  TROPONIN I (HIGH SENSITIVITY)  TROPONIN I (HIGH SENSITIVITY)    EKG Sinus rhythm with a rate of 82 bpm.  Leftward axis, left bundle.  No STEMI by Sgarbossa criteria.  RADIOLOGY CXR interpreted by me without evidence of acute cardiopulmonary pathology.  Official radiology report(s): DG Chest 1 View Result Date: 12/11/2023 CLINICAL DATA:  Left-sided chest pain, initial encounter EXAM: PORTABLE CHEST 1 VIEW COMPARISON:  11/07/2023 FINDINGS: The heart size and mediastinal contours are within normal limits. Both lungs are clear. The visualized skeletal structures are unremarkable. IMPRESSION: No active disease. Electronically Signed   By: Oneil Devonshire M.D.   On: 12/11/2023 20:22    PROCEDURES and INTERVENTIONS:  .1-3 Lead EKG Interpretation  Performed by: Claudene Rover, MD Authorized by:  Claudene Rover, MD     Interpretation: normal     ECG rate:  80   ECG rate assessment: normal     Rhythm: sinus rhythm     Ectopy: none     Conduction: normal     Medications  lidocaine  (LIDODERM ) 5 % 1 patch (1 patch Transdermal Patch Applied 12/11/23 2131)  acetaminophen  (TYLENOL ) tablet 1,000 mg (1,000 mg Oral Given 12/11/23 2131)     IMPRESSION / MDM / ASSESSMENT AND PLAN / ED COURSE  I reviewed the triage vital signs and the nursing notes.  Differential diagnosis includes, but is not limited to, ACS, PTX, PNA, muscle strain/spasm, PE, dissection, anxiety, pleural effusion  {Patient presents with symptoms of an acute illness or injury that is potentially life-threatening.  Patient presents with atypical chest discomfort.  Recent cardiac stenting 2 months ago.  Symptoms are atypical and her EKG is at baseline, first troponin is negative.  Will provide  medications to possibly alleviate MSK pain, sent for a second troponin, screen PE with D-dimer.  Normal metabolic panel and CBC.  2 troponins remain low, negative D-dimer.  Atypical discomforts and less likely to be representing ACS.  I considered admission for this patient but believe she would be suitable for trial of outpatient management considering the reassuring nature of her workup today.  Clinical Course as of 12/11/23 2310  Tue Dec 11, 2023  2304 Reassessed and discussed reassuring workup.  Discussed plan of care.  She is comfortable going home we discussed cardiology follow-up and close ED return precautions. [DS]    Clinical Course User Index [DS] Claudene Rover, MD     FINAL CLINICAL IMPRESSION(S) / ED DIAGNOSES   Final diagnoses:  Other chest pain     Rx / DC Orders   ED Discharge Orders     None        Note:  This document was prepared using Dragon voice recognition software and may include unintentional dictation errors.   Claudene Rover, MD 12/11/23 [phone]

## 2023-12-12 ENCOUNTER — Encounter

## 2023-12-13 ENCOUNTER — Ambulatory Visit: Admitting: Pulmonary Disease

## 2023-12-13 DIAGNOSIS — R0600 Dyspnea, unspecified: Secondary | ICD-10-CM

## 2023-12-13 LAB — PULMONARY FUNCTION TEST
DL/VA % pred: 101 %
DL/VA: 4.2 ml/min/mmHg/L
DLCO unc % pred: 95 %
DLCO unc: 18.83 ml/min/mmHg
FEF 25-75 Post: 2.55 L/s
FEF 25-75 Pre: 2.48 L/s
FEF2575-%Change-Post: 2 %
FEF2575-%Pred-Post: 125 %
FEF2575-%Pred-Pre: 121 %
FEV1-%Change-Post: 1 %
FEV1-%Pred-Post: 99 %
FEV1-%Pred-Pre: 98 %
FEV1-Post: 2.35 L
FEV1-Pre: 2.33 L
FEV1FVC-%Change-Post: 1 %
FEV1FVC-%Pred-Pre: 105 %
FEV6-%Change-Post: 0 %
FEV6-%Pred-Post: 96 %
FEV6-%Pred-Pre: 95 %
FEV6-Post: 2.86 L
FEV6-Pre: 2.83 L
FEV6FVC-%Change-Post: 0 %
FEV6FVC-%Pred-Post: 103 %
FEV6FVC-%Pred-Pre: 104 %
FVC-%Change-Post: 0 %
FVC-%Pred-Post: 92 %
FVC-%Pred-Pre: 92 %
FVC-Post: 2.87 L
FVC-Pre: 2.88 L
Post FEV1/FVC ratio: 82 %
Post FEV6/FVC ratio: 99 %
Pre FEV1/FVC ratio: 81 %
Pre FEV6/FVC Ratio: 100 %

## 2023-12-13 NOTE — Patient Instructions (Signed)
 Pre/post spiro and DLCO performed today.

## 2023-12-13 NOTE — Progress Notes (Signed)
 Pre/post spiro and DLCO performed today.

## 2023-12-14 ENCOUNTER — Encounter

## 2023-12-14 DIAGNOSIS — Z955 Presence of coronary angioplasty implant and graft: Secondary | ICD-10-CM

## 2023-12-14 DIAGNOSIS — Z48812 Encounter for surgical aftercare following surgery on the circulatory system: Secondary | ICD-10-CM | POA: Diagnosis not present

## 2023-12-14 NOTE — Progress Notes (Signed)
 Daily Session Note  Patient Details  Name: Brandi Mahoney MRN: [id number] Date of Birth: 1955-12-05 Referring Provider:   Flowsheet Row Cardiac Rehab from 10/24/2023 in [facility] Cardiac and Pulmonary Rehab  Referring Provider Dr. Evalene Lunger, MD    Encounter Date: 12/14/2023  Check In:  Session Check In - 12/14/23 0920       Check-In   Supervising physician immediately available to respond to emergencies See telemetry face sheet for immediately available ER MD    Location [facility]-Cardiac & Pulmonary Rehab    Staff Present Devaughn Jaeger, BS, Exercise Physiologist;Vana Arif RN,BSN,MPA;Maxon Conetta BS, Exercise Physiologist;Joseph Rolinda RCP,RRT,BSRT    Virtual Visit No    Medication changes reported     No    Fall or balance concerns reported    No    Warm-up and Cool-down Performed on first and last piece of equipment    Resistance Training Performed Yes    VAD Patient? No    PAD/SET Patient? No      Pain Assessment   Currently in Pain? No/denies             Social History   Tobacco Use  Smoking Status Former   Current packs/day: 0.00   Average packs/day: 0.4 packs/day for 35.0 years (12.3 ttl pk-yrs)   Types: Cigarettes   Start date: 09/25/1973   Quit date: 09/25/2008   Years since quitting: 15.2  Smokeless Tobacco Never  Tobacco Comments   quit in 2007-2008, social smoker    Goals Met:  Independence with exercise equipment Exercise tolerated well No report of concerns or symptoms today Strength training completed today  Goals Unmet:  Not Applicable  Comments: Pt able to follow exercise prescription today without complaint.  Will continue to monitor for progression.    Dr. Oneil Pinal is Medical Director for [facility] Cardiac Rehabilitation.  Dr. Fuad Aleskerov is Medical Director for [facility] Pulmonary Rehabilitation.

## 2023-12-17 ENCOUNTER — Encounter

## 2023-12-17 DIAGNOSIS — H6983 Other specified disorders of Eustachian tube, bilateral: Secondary | ICD-10-CM | POA: Diagnosis not present

## 2023-12-17 DIAGNOSIS — H903 Sensorineural hearing loss, bilateral: Secondary | ICD-10-CM | POA: Diagnosis not present

## 2023-12-19 ENCOUNTER — Encounter: Attending: Cardiovascular Disease

## 2023-12-19 DIAGNOSIS — Z955 Presence of coronary angioplasty implant and graft: Secondary | ICD-10-CM | POA: Insufficient documentation

## 2023-12-19 NOTE — Progress Notes (Signed)
 30 Day review completed. Medical Director ITP review done, changes made as directed, and signed approval by Medical Director. ? ?

## 2023-12-19 NOTE — Progress Notes (Signed)
 Daily Session Note  Patient Details  Name: Brandi Mahoney MRN: [id number] Date of Birth: 06-Jan-1956 Referring Provider:   Flowsheet Row Cardiac Rehab from 10/24/2023 in [facility] Cardiac and Pulmonary Rehab  Referring Provider Dr. Evalene Lunger, MD    Encounter Date: 12/19/2023  Check In:  Session Check In - 12/19/23 0932       Check-In   Supervising physician immediately available to respond to emergencies See telemetry face sheet for immediately available ER MD    Location [facility]-Cardiac & Pulmonary Rehab    Staff Present Rollene Paterson, MS, Exercise Physiologist;Riniyah Speich RN,BSN,MPA;Maxon Conetta BS, Exercise Physiologist;Joseph Rolinda RCP,RRT,BSRT    Virtual Visit No    Medication changes reported     No    Fall or balance concerns reported    No    Warm-up and Cool-down Performed on first and last piece of equipment    Resistance Training Performed Yes    VAD Patient? No    PAD/SET Patient? No      Pain Assessment   Currently in Pain? No/denies             Social History   Tobacco Use  Smoking Status Former   Current packs/day: 0.00   Average packs/day: 0.4 packs/day for 35.0 years (12.3 ttl pk-yrs)   Types: Cigarettes   Start date: 09/25/1973   Quit date: 09/25/2008   Years since quitting: 15.2  Smokeless Tobacco Never  Tobacco Comments   quit in 2007-2008, social smoker    Goals Met:  Independence with exercise equipment Exercise tolerated well No report of concerns or symptoms today Strength training completed today  Goals Unmet:  Not Applicable  Comments: Pt able to follow exercise prescription today without complaint.  Will continue to monitor for progression.    Dr. Oneil Pinal is Medical Director for [facility] Cardiac Rehabilitation.  Dr. Fuad Aleskerov is Medical Director for [facility] Pulmonary Rehabilitation.

## 2023-12-19 NOTE — Progress Notes (Signed)
 Cardiac Individual Treatment Plan  Patient Details  Name: Brandi Mahoney MRN: [id number] Date of Birth: [birth year] Referring Provider:   Flowsheet Row Cardiac Rehab from 10/24/2023 in [facility] Cardiac and Pulmonary Rehab  Referring Provider Dr. Evalene Lunger, MD    Initial Encounter Date:  Flowsheet Row Cardiac Rehab from 10/24/2023 in [facility] Cardiac and Pulmonary Rehab  Date 10/24/23    Visit Diagnosis: Status post coronary artery stent placement  Patient's Home Medications on Admission:  Current Outpatient Medications:    Accu-Chek Softclix Lancets lancets, Use to check blood sugar daily, Disp: 100 each, Rfl: 3   albuterol  (VENTOLIN  HFA) 108 (90 Base) MCG/ACT inhaler, Inhale 2 puffs into the lungs every 6 (six) hours as needed for wheezing or shortness of breath., Disp: 8 g, Rfl: 2   aspirin  EC 81 MG tablet, Take 81 mg by mouth daily., Disp: , Rfl:    clobetasol  ointment (TEMOVATE ) 0.05 %, Apply 1 Application topically daily as needed (skin conditions)., Disp: , Rfl:    fenofibrate  160 MG tablet, Take 1 tablet (160 mg total) by mouth daily., Disp: 90 tablet, Rfl: 3   fluticasone (FLONASE) 50 MCG/ACT nasal spray, Place 2 sprays into both nostrils in the morning and at bedtime., Disp: , Rfl:    gabapentin  (NEURONTIN ) 300 MG capsule, Take 300 mg by mouth at bedtime., Disp: , Rfl:    glucose blood (ACCU-CHEK GUIDE) test strip, Use to check blood sugar daily, Disp: 100 each, Rfl: 3   Lactobacillus-Inulin (CULTURELLE ADULT ULT BALANCE PO), Take 1 tablet by mouth daily., Disp: , Rfl:    levothyroxine  (SYNTHROID ) 75 MCG tablet, TAKE ONE TABLET BY MOUTH EVERY MORNING BEFORE BREAKFAST, Disp: 90 tablet, Rfl: 3   Magnesium  250 MG TABS, Take 250 mg by mouth daily., Disp: , Rfl:    metFORMIN  (GLUCOPHAGE -XR) 500 MG 24 hr tablet, Take 1,000 mg by mouth 2 (two) times daily with a meal., Disp: , Rfl:    metoprolol  succinate (TOPROL -XL) 50 MG 24 hr tablet, Take 1 tablet (50 mg total) by mouth daily. Take with  or immediately following a meal. (Patient not taking: Reported on 11/29/2023), Disp: 90 tablet, Rfl: 3   Multiple Vitamins-Minerals (ONE-A-DAY WOMENS PETITES PO), Take 1 tablet by mouth daily., Disp: , Rfl:    nitroGLYCERIN  (NITROSTAT ) 0.4 MG SL tablet, Place 1 tablet under tongue at onset of chest pain; you may repeat every 5 minutes for up to 3 doses., Disp: 25 tablet, Rfl: 3   pantoprazole  (PROTONIX ) 40 MG tablet, Take 1 tablet (40 mg total) by mouth daily., Disp: 90 tablet, Rfl: 3   prasugrel  (EFFIENT ) 10 MG TABS tablet, Take 1 tablet (10 mg total) by mouth daily., Disp: 30 tablet, Rfl: 11   rosuvastatin  (CRESTOR ) 40 MG tablet, Take 1 tablet (40 mg total) by mouth daily., Disp: 90 tablet, Rfl: 3   sertraline  (ZOLOFT ) 50 MG tablet, TAKE 1 TABLET BY MOUTH DAILY, Disp: 90 tablet, Rfl: 0   traMADol (ULTRAM) 50 MG tablet, Take 50 mg by mouth 2 (two) times daily as needed., Disp: , Rfl:    traZODone  (DESYREL ) 100 MG tablet, TAKE 1 TABLET BY MOUTH AT BEDTIME, Disp: 30 tablet, Rfl: 2  Past Medical History: Past Medical History:  Diagnosis Date   CAD (coronary artery disease)    a. 01/2010 Cath: mild atherosclerosis in the mid LCX after takeoff of OM1, mild diff LAD dzs;  b. 07/2013 MV: EF 67%, no ischemia.   GAD (generalized anxiety disorder)    History  of kidney stones    HLD (hyperlipidemia)    Hypothyroidism    Major depressive disorder, recurrent episode, in full remission (HCC) 09/08/2008   Qualifier: Diagnosis of  By: Watt MD, Spencer     Migraine headache    Myocarditis (HCC)    a. 01/2010 presented w/ c/p and elev trop-->Cath: mild atherosclerosis in the mid LCX after takeoff of OM1, mild diff LAD dzs;  b. 03/2010 Echo: EF 60-65%, DD, mild LVH;  c. 07/2011 Echo: EF 55-60%, mild LVH, no rwma.   Sleep apnea    has c-pap.  wears off and on - not lately  using  per reported 07-29-2020   Type 2 diabetes, diet controlled (HCC)     Tobacco Use: Social History   Tobacco Use  Smoking Status  Former   Current packs/day: 0.00   Average packs/day: 0.4 packs/day for 35.0 years (12.3 ttl pk-yrs)   Types: Cigarettes   Start date: 09/25/1973   Quit date: 09/25/2008   Years since quitting: 15.2  Smokeless Tobacco Never  Tobacco Comments   quit in 2007-2008, social smoker    Labs: Review Flowsheet  More data exists      Latest Ref Rng & Units 12/27/2021 08/23/2022 11/22/2022 08/06/2023 11/30/2023  Labs for ITP Cardiac and Pulmonary Rehab  Cholestrol 100 - 199 mg/dL - - 875  - 80   LDL (calc) 0 - 99 mg/dL - - - - 21   Direct LDL 0 - 99 mg/dL - - 37.9  - 19   HDL-C >39 mg/dL - - 59.89  - 36   Trlycerides 0 - 149 mg/dL - - 749.9  - 869   Hemoglobin A1c 4.0 - 5.6 % 7.2  8.3  7.8  6.4  -     Exercise Target Goals: Exercise Program Goal: Individual exercise prescription set using results from initial 6 min walk test and THRR while considering  patient's activity barriers and safety.   Exercise Prescription Goal: Initial exercise prescription builds to 30-45 minutes a day of aerobic activity, 2-3 days per week.  Home exercise guidelines will be given to patient during program as part of exercise prescription that the participant will acknowledge.   Education: Aerobic Exercise: - Group verbal and visual presentation on the components of exercise prescription. Introduces F.I.T.T principle from ACSM for exercise prescriptions.  Reviews F.I.T.T. principles of aerobic exercise including progression. Written material given at graduation.   Education: Resistance Exercise: - Group verbal and visual presentation on the components of exercise prescription. Introduces F.I.T.T principle from ACSM for exercise prescriptions  Reviews F.I.T.T. principles of resistance exercise including progression. Written material given at graduation.    Education: Exercise & Equipment Safety: - Individual verbal instruction and demonstration of equipment use and safety with use of the equipment. Flowsheet Row  Cardiac Rehab from 10/24/2023 in [facility] Cardiac and Pulmonary Rehab  Date 10/24/23  Educator NT  Instruction Review Code 1- Verbalizes Understanding    Education: Exercise Physiology & General Exercise Guidelines: - Group verbal and written instruction with models to review the exercise physiology of the cardiovascular system and associated critical values. Provides general exercise guidelines with specific guidelines to those with heart or lung disease.    Education: Flexibility, Balance, Mind/Body Relaxation: - Group verbal and visual presentation with interactive activity on the components of exercise prescription. Introduces F.I.T.T principle from ACSM for exercise prescriptions. Reviews F.I.T.T. principles of flexibility and balance exercise training including progression. Also discusses the mind body connection.  Reviews various  relaxation techniques to help reduce and manage stress (i.e. Deep breathing, progressive muscle relaxation, and visualization). Balance handout provided to take home. Written material given at graduation.   Activity Barriers & Risk Stratification:  Activity Barriers & Cardiac Risk Stratification - 10/23/23 1306       Activity Barriers & Cardiac Risk Stratification   Activity Barriers Back Problems;Arthritis    Cardiac Risk Stratification Moderate          6 Minute Walk:  6 Minute Walk     Row Name 10/24/23 1127         6 Minute Walk   Phase Initial     Distance 1235 feet     Walk Time 6 minutes     # of Rest Breaks 0     MPH 2.34     METS 2.96     RPE 10     Perceived Dyspnea  1     VO2 Peak 10.34     Symptoms No     Resting HR 78 bpm     Resting BP 128/64     Resting Oxygen Saturation  96 %     Exercise Oxygen Saturation  during 6 min walk 96 %     Max Ex. HR 99 bpm     Max Ex. BP 130/66     2 Minute Post BP 122/58        Oxygen Initial Assessment:   Oxygen Re-Evaluation:   Oxygen Discharge (Final Oxygen  Re-Evaluation):   Initial Exercise Prescription:  Initial Exercise Prescription - 10/24/23 1100       Date of Initial Exercise RX and Referring Provider   Date 10/24/23    Referring Provider Dr. Timothy Gollan, MD      Oxygen   Maintain Oxygen Saturation 88% or higher      Treadmill   MPH 2.3    Grade 1    Minutes 15    METs 3.08      NuStep   Level 2    SPM 80    Minutes 15    METs 2.96      REL-XR   Level 2    Speed 50    Minutes 15    METs 2.96      Prescription Details   Frequency (times per week) 3    Duration Progress to 30 minutes of continuous aerobic without signs/symptoms of physical distress      Intensity   THRR 40-80% of Max Heartrate 108-138    Ratings of Perceived Exertion 11-13    Perceived Dyspnea 0-4      Progression   Progression Continue to progress workloads to maintain intensity without signs/symptoms of physical distress.      Resistance Training   Training Prescription Yes    Weight 4 lb    Reps 10-15          Perform Capillary Blood Glucose checks as needed.  Exercise Prescription Changes:   Exercise Prescription Changes     Row Name 10/24/23 1100 11/13/23 1400 11/29/23 0800 12/12/23 0800       Response to Exercise   Blood Pressure (Admit) 128/64 118/60 124/62 134/63    Blood Pressure (Exercise) 130/66 134/68 148/70 132/52    Blood Pressure (Exit) 122/58 118/62 122/64 120/60    Heart Rate (Admit) 78 bpm 77 bpm 80 bpm 74 bpm    Heart Rate (Exercise) 99 bpm 110 bpm 103 bpm 105 bpm    Heart Rate (Exit) 75  bpm 64 bpm 78 bpm 73 bpm    Oxygen Saturation (Admit) 96 % -- -- --    Oxygen Saturation (Exercise) 96 % -- -- --    Rating of Perceived Exertion (Exercise) 10 13 13 12     Perceived Dyspnea (Exercise) 1 0 -- --    Symptoms none none none none    Comments Results first 2 weeks of exercise -- --    Duration -- Progress to 30 minutes of  aerobic without signs/symptoms of physical distress Continue with 30 min of  aerobic exercise without signs/symptoms of physical distress. Continue with 30 min of aerobic exercise without signs/symptoms of physical distress.    Intensity -- THRR unchanged THRR unchanged THRR unchanged      Progression   Progression -- Continue to progress workloads to maintain intensity without signs/symptoms of physical distress. Continue to progress workloads to maintain intensity without signs/symptoms of physical distress. Continue to progress workloads to maintain intensity without signs/symptoms of physical distress.    Average METs -- 2.4 3.34 2.6      Resistance Training   Training Prescription -- Yes Yes Yes    Weight -- 4 lb 4 lb 4 lb    Reps -- 10-15 10-15 10-15      Interval Training   Interval Training -- No No No      Treadmill   MPH -- 2.2 2.2 2    Grade -- 1 1 0    Minutes -- 15 15 15     METs -- 2.99 2.99 2.53      NuStep   Level -- 2  T6 -- --    Minutes -- 15 -- --    METs -- 1.7 -- --      REL-XR   Level -- 1 -- 1    Minutes -- 15 -- 15    METs -- 1.4 -- 2.3      T5 Nustep   Level -- -- -- 1    Minutes -- -- -- 15    METs -- -- -- 1.7      Rower   Level -- -- 3 4    Watts -- -- 15 10    Minutes -- -- 15 15    METs -- -- 4.28 4.05      Oxygen   Maintain Oxygen Saturation -- 88% or higher 88% or higher 88% or higher       Exercise Comments:   Exercise Comments     Row Name 10/29/23 0954           Exercise Comments First full day of exercise!  Patient was oriented to gym and equipment including functions, settings, policies, and procedures.  Patient's individual exercise prescription and treatment plan were reviewed.  All starting workloads were established based on the results of the 6 minute walk test done at initial orientation visit.  The plan for exercise progression was also introduced and progression will be customized based on patient's performance and goals.          Exercise Goals and Review:   Exercise Goals     Row  Name 10/24/23 1128             Exercise Goals   Increase Physical Activity Yes       Intervention Provide advice, education, support and counseling about physical activity/exercise needs.;Develop an individualized exercise prescription for aerobic and resistive training based on initial evaluation findings, risk stratification, comorbidities and participant's personal goals.  Expected Outcomes Short Term: Attend rehab on a regular basis to increase amount of physical activity.;Long Term: Add in home exercise to make exercise part of routine and to increase amount of physical activity.;Long Term: Exercising regularly at least 3-5 days a week.       Increase Strength and Stamina Yes       Intervention Provide advice, education, support and counseling about physical activity/exercise needs.;Develop an individualized exercise prescription for aerobic and resistive training based on initial evaluation findings, risk stratification, comorbidities and participant's personal goals.       Expected Outcomes Short Term: Increase workloads from initial exercise prescription for resistance, speed, and METs.;Short Term: Perform resistance training exercises routinely during rehab and add in resistance training at home;Long Term: Improve cardiorespiratory fitness, muscular endurance and strength as measured by increased METs and functional capacity ( )       Able to understand and use rate of perceived exertion (RPE) scale Yes       Intervention Provide education and explanation on how to use RPE scale       Expected Outcomes Short Term: Able to use RPE daily in rehab to express subjective intensity level;Long Term:  Able to use RPE to guide intensity level when exercising independently       Able to understand and use Dyspnea scale Yes       Intervention Provide education and explanation on how to use Dyspnea scale       Expected Outcomes Short Term: Able to use Dyspnea scale daily in rehab to express  subjective sense of shortness of breath during exertion;Long Term: Able to use Dyspnea scale to guide intensity level when exercising independently       Knowledge and understanding of Target Heart Rate Range (THRR) Yes       Intervention Provide education and explanation of THRR including how the numbers were predicted and where they are located for reference       Expected Outcomes Short Term: Able to state/look up THRR;Long Term: Able to use THRR to govern intensity when exercising independently;Short Term: Able to use daily as guideline for intensity in rehab       Able to check pulse independently Yes       Intervention Provide education and demonstration on how to check pulse in carotid and radial arteries.;Review the importance of being able to check your own pulse for safety during independent exercise       Expected Outcomes Short Term: Able to explain why pulse checking is important during independent exercise;Long Term: Able to check pulse independently and accurately       Understanding of Exercise Prescription Yes       Intervention Provide education, explanation, and written materials on patient's individual exercise prescription       Expected Outcomes Short Term: Able to explain program exercise prescription;Long Term: Able to explain home exercise prescription to exercise independently          Exercise Goals Re-Evaluation :  Exercise Goals Re-Evaluation     Row Name 10/29/23 0955 11/13/23 1437 11/29/23 0806 12/12/23 0831       Exercise Goal Re-Evaluation   Exercise Goals Review Able to understand and use rate of perceived exertion (RPE) scale;Able to understand and use Dyspnea scale;Knowledge and understanding of Target Heart Rate Range (THRR) Increase Physical Activity;Increase Strength and Stamina;Understanding of Exercise Prescription Increase Physical Activity;Increase Strength and Stamina;Understanding of Exercise Prescription Increase Physical Activity;Increase Strength  and Stamina;Understanding of Exercise Prescription  Comments Reviewed RPE and dyspnea scale, THR and program prescription with pt today.  Pt voiced understanding and was given a copy of goals to take home. Brandi Mahoney is off to a good start in the program. She was able to attend her first four sessions during this review. During her first sessions she was able to use the treadmill at a speed of 2. and 1% incline, and the T6 nustep at level 2. We will continue to monitor her progress in the program. Brandi Mahoney has only attended two sessions since the last review. She has continued to walk the treadmill at a speed of 2.2 mph with an incline of 1%. She also began using the rower at level 3. We will continue to monitor her progress in the program. Brandi Mahoney is doing well in rehab. She was recently able to increase from level 3 to 4 on the rowing machine. She was also able to maintain level 1 on the XR. We will continue to monitor her progress in the program.    Expected Outcomes Short: Use RPE daily to regulate intensity. Long: Follow program prescription in THR. Short: Continue to follow exercise prescription. Long: Continue exercise to imporve strength and stamina. Short: Attend rehab more regularly. Long: Continue exercise to imporve strength and stamina. Short: increase treadmill workload. Long: Continue exercise to improve strength and stamina.       Discharge Exercise Prescription (Final Exercise Prescription Changes):  Exercise Prescription Changes - 12/12/23 0800       Response to Exercise   Blood Pressure (Admit) 134/63    Blood Pressure (Exercise) 132/52    Blood Pressure (Exit) 120/60    Heart Rate (Admit) 74 bpm    Heart Rate (Exercise) 105 bpm    Heart Rate (Exit) 73 bpm    Rating of Perceived Exertion (Exercise) 12    Symptoms none    Duration Continue with 30 min of aerobic exercise without signs/symptoms of physical distress.    Intensity THRR unchanged      Progression   Progression Continue to  progress workloads to maintain intensity without signs/symptoms of physical distress.    Average METs 2.6      Resistance Training   Training Prescription Yes    Weight 4 lb    Reps 10-15      Interval Training   Interval Training No      Treadmill   MPH 2    Grade 0    Minutes 15    METs 2.53      REL-XR   Level 1    Minutes 15    METs 2.3      T5 Nustep   Level 1    Minutes 15    METs 1.7      Rower   Level 4    Watts 10    Minutes 15    METs 4.05      Oxygen   Maintain Oxygen Saturation 88% or higher          Nutrition:  Target Goals: Understanding of nutrition guidelines, daily intake of sodium 1500mg , cholesterol 200mg , calories 30% from fat and 7% or less from saturated fats, daily to have 5 or more servings of fruits and vegetables.  Education: All About Nutrition: -Group instruction provided by verbal, written material, interactive activities, discussions, models, and posters to present general guidelines for heart healthy nutrition including fat, fiber, MyPlate, the role of sodium in heart healthy nutrition, utilization of the nutrition label, and utilization of  this knowledge for meal planning. Follow up email sent as well. Written material given at graduation.   Biometrics:  Pre Biometrics - 10/24/23 1130       Pre Biometrics   Height 5' 4.5 (1.638 m)    Weight 154 lb 14.4 oz (70.3 kg)    Waist Circumference 36 inches    Hip Circumference 39 inches    Waist to Hip Ratio 0.92 %    BMI (Calculated) 26.19    Single Leg Stand 4.9 seconds           Nutrition Therapy Plan and Nutrition Goals:  Nutrition Therapy & Goals - 10/24/23 1513       Nutrition Therapy   Diet Carb controlled, Cardiac, Low Na    Protein (specify units) 70-90g    Fiber 25 grams    Whole Grain Foods 3 servings    Saturated Fats 15 max. grams    Fruits and Vegetables 5 servings/day    Sodium 2 grams      Personal Nutrition Goals   Nutrition Goal Read labels and  reduce sodium intake to below 2300mg . Ideally 1500mg  per day.    Personal Goal #2 Eat 15-30gProtein and 30-60gCarbs at each meal.    Personal Goal #3 Reduce saturated fat, less than 12g per day. Replace bad fats for more heart healthy fats.    Comments Patient drinking ~50-75oz of water. She eats 3 meals per day, sometimes will have fruit as a snack between meals. She has DM and knows to monitor her carb intake. Reviewed goal of ~30-60g carbs per meal. Encouraged her to pair her carbs with protein. She has been watching her salt intake trying to stay below 2300mg . Provided her a guideline of less than 1500mg  per day. Reviewed mediterranean diet handout. Educated on types of fats, sources, and how to read labels. Reviewed several facts labels together. She is knowledgeable of foods and macros. She knows what to do and has followed a heart healthy diet years ago. She wants to set goal to be more strict on sodium and saturated fats, and stay consistent with her healthy eating.      Intervention Plan   Intervention Prescribe, educate and counsel regarding individualized specific dietary modifications aiming towards targeted core components such as weight, hypertension, lipid management, diabetes, heart failure and other comorbidities.;Nutrition handout(s) given to patient.    Expected Outcomes Short Term Goal: Understand basic principles of dietary content, such as calories, fat, sodium, cholesterol and nutrients.;Short Term Goal: A plan has been developed with personal nutrition goals set during dietitian appointment.;Long Term Goal: Adherence to prescribed nutrition plan.          Nutrition Assessments:  MEDIFICTS Score Key: >=70 Need to make dietary changes  40-70 Heart Healthy Diet <= 40 Therapeutic Level Cholesterol Diet   Picture Your Plate Scores: <59 Unhealthy dietary pattern with much room for improvement. 41-50 Dietary pattern unlikely to meet recommendations for good health and room for  improvement. 51-60 More healthful dietary pattern, with some room for improvement.  >60 Healthy dietary pattern, although there may be some specific behaviors that could be improved.    Nutrition Goals Re-Evaluation:  Nutrition Goals Re-Evaluation     Row Name 11/26/23 0943             Goals   Current Weight 157 lb 3.2 oz (71.3 kg)       Comment Brandi Mahoney is complying with RD nutrition goals. She is still trying to lose some weight and  says her target weight it 145lb.       Expected Outcome Short: Continue to diet and follow RD guidelines. Long: Continue to attend rehab and manage diet.          Nutrition Goals Discharge (Final Nutrition Goals Re-Evaluation):  Nutrition Goals Re-Evaluation - 11/26/23 0943       Goals   Current Weight 157 lb 3.2 oz (71.3 kg)    Comment Brandi Mahoney is complying with RD nutrition goals. She is still trying to lose some weight and says her target weight it 145lb.    Expected Outcome Short: Continue to diet and follow RD guidelines. Long: Continue to attend rehab and manage diet.          Psychosocial: Target Goals: Acknowledge presence or absence of significant depression and/or stress, maximize coping skills, provide positive support system. Participant is able to verbalize types and ability to use techniques and skills needed for reducing stress and depression.   Education: Stress, Anxiety, and Depression - Group verbal and visual presentation to define topics covered.  Reviews how body is impacted by stress, anxiety, and depression.  Also discusses healthy ways to reduce stress and to treat/manage anxiety and depression.  Written material given at graduation.   Education: Sleep Hygiene -Provides group verbal and written instruction about how sleep can affect your health.  Define sleep hygiene, discuss sleep cycles and impact of sleep habits. Review good sleep hygiene tips.    Initial Review & Psychosocial Screening:  Initial Psych Review & Screening -  10/23/23 1310       Initial Review   Current issues with Current Stress Concerns      Family Dynamics   Good Support System? Yes      Barriers   Psychosocial barriers to participate in program There are no identifiable barriers or psychosocial needs.;The patient should benefit from training in stress management and relaxation.      Screening Interventions   Interventions Encouraged to exercise;Provide feedback about the scores to participant;To provide support and resources with identified psychosocial needs    Expected Outcomes Short Term goal: Utilizing psychosocial counselor, staff and physician to assist with identification of specific Stressors or current issues interfering with healing process. Setting desired goal for each stressor or current issue identified.;Long Term Goal: Stressors or current issues are controlled or eliminated.;Short Term goal: Identification and review with participant of any Quality of Life or Depression concerns found by scoring the questionnaire.;Long Term goal: The participant improves quality of Life and PHQ9 Scores as seen by post scores and/or verbalization of changes          Quality of Life Scores:   Scores of 19 and below usually indicate a poorer quality of life in these areas.  A difference of  2-3 points is a clinically meaningful difference.  A difference of 2-3 points in the total score of the Quality of Life Index has been associated with significant improvement in overall quality of life, self-image, physical symptoms, and general health in studies assessing change in quality of life.  PHQ-9: Review Flowsheet  More data exists      11/14/2023 10/24/2023 12/06/2021 10/05/2021 01/28/2020  Depression screen PHQ 2/9  Decreased Interest 0 0 0 0 0  Down, Depressed, Hopeless 0 0 2 0 0  PHQ - 2 Score 0 0 2 0 0  Altered sleeping - 0 1 - -  Tired, decreased energy - 0 0 - -  Change in appetite - 0 0 - -  Feeling bad or failure about yourself  - 0 0 -  -  Trouble concentrating - 0 0 - -  Moving slowly or fidgety/restless - 0 0 - -  Suicidal thoughts - 0 0 - -  PHQ-9 Score - 0 3 - -  Difficult doing work/chores - - Somewhat difficult - -   Interpretation of Total Score  Total Score Depression Severity:  1-4 = Minimal depression, 5-9 = Mild depression, 10-14 = Moderate depression, 15-19 = Moderately severe depression, 20-27 = Severe depression   Psychosocial Evaluation and Intervention:  Psychosocial Evaluation - 10/23/23 1321       Psychosocial Evaluation & Interventions   Comments Brandi Mahoney is coming to cardiac rehab after a stent. 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 She also is being worked up for pulmonary issues and is scheduled for a CT and PFTs to help provide reasons as to why she feels short of breath. She states she feels like she is handling all her medical issues well, but admits it has been a lot on her. She focuses on one day at a time and being thankful for what she has. She has a great support system. She suffers with insomnia and has for years now, so getting good rest if difficult for her.    Expected Outcomes Short: attend cardiac rehab for education and exercise Long: develop and maintain positive self care habits    Continue Psychosocial Services  Follow up required by staff          Psychosocial Re-Evaluation:  Psychosocial Re-Evaluation     Row Name 11/26/23 0940             Psychosocial Re-Evaluation   Current issues with Current Stress Concerns       Comments Brandi Mahoney states that she isnt dealing with any stressors at this time. She does state that she deals insomnia, she was prescribed medication and that seems to be helping.       Expected Outcomes Short: Continue to comply with insomnia medication. Long: Continue to manage stress and insomnia.       Interventions Encouraged to attend Cardiac Rehabilitation for the exercise        Continue Psychosocial Services  Follow up required by staff          Psychosocial Discharge (Final Psychosocial Re-Evaluation):  Psychosocial Re-Evaluation - 11/26/23 0940       Psychosocial Re-Evaluation   Current issues with Current Stress Concerns    Comments Brandi Mahoney states that she isnt dealing with any stressors at this time. She does state that she deals insomnia, she was prescribed medication and that seems to be helping.    Expected Outcomes Short: Continue to comply with insomnia medication. Long: Continue to manage stress and insomnia.    Interventions Encouraged to attend Cardiac Rehabilitation for the exercise    Continue Psychosocial Services  Follow up required by staff          Vocational Rehabilitation: Provide vocational rehab assistance to qualifying candidates.   Vocational Rehab Evaluation & Intervention:  Vocational Rehab - 10/23/23 1310       Initial Vocational Rehab Evaluation & Intervention   Assessment shows need for Vocational Rehabilitation No          Education: Education Goals: Education classes will be provided on a variety of topics geared toward better understanding of heart health and risk factor modification. Participant will  state understanding/return demonstration of topics presented as noted by education test scores.  Learning Barriers/Preferences:  Learning Barriers/Preferences - 10/23/23 1310       Learning Barriers/Preferences   Learning Barriers None    Learning Preferences None          General Cardiac Education Topics:  AED/CPR: - Group verbal and written instruction with the use of models to demonstrate the basic use of the AED with the basic ABC's of resuscitation.   Anatomy and Cardiac Procedures: - Group verbal and visual presentation and models provide information about basic cardiac anatomy and function. Reviews the testing methods done to diagnose heart disease and the outcomes of the test results. Describes the  treatment choices: Medical Management, Angioplasty, or Coronary Bypass Surgery for treating various heart conditions including Myocardial Infarction, Angina, Valve Disease, and Cardiac Arrhythmias.  Written material given at graduation.   Medication Safety: - Group verbal and visual instruction to review commonly prescribed medications for heart and lung disease. Reviews the medication, class of the drug, and side effects. Includes the steps to properly store meds and maintain the prescription regimen.  Written material given at graduation.   Intimacy: - Group verbal instruction through game format to discuss how heart and lung disease can affect sexual intimacy. Written material given at graduation..   Know Your Numbers and Heart Failure: - Group verbal and visual instruction to discuss disease risk factors for cardiac and pulmonary disease and treatment options.  Reviews associated critical values for Overweight/Obesity, Hypertension, Cholesterol, and Diabetes.  Discusses basics of heart failure: signs/symptoms and treatments.  Introduces Heart Failure Zone chart for action plan for heart failure.  Written material given at graduation.   Infection Prevention: - Provides verbal and written material to individual with discussion of infection control including proper hand washing and proper equipment cleaning during exercise session. Flowsheet Row Cardiac Rehab from 10/24/2023 in [facility] Cardiac and Pulmonary Rehab  Date 10/24/23  Educator NT  Instruction Review Code 1- Verbalizes Understanding    Falls Prevention: - Provides verbal and written material to individual with discussion of falls prevention and safety. Flowsheet Row Cardiac Rehab from 10/24/2023 in [facility] Cardiac and Pulmonary Rehab  Date 10/24/23  Educator NT  Instruction Review Code 1- Verbalizes Understanding    Other: -Provides group and verbal instruction on various topics (see comments)   Knowledge Questionnaire  Score:   Core Components/Risk Factors/Patient Goals at Admission:  Personal Goals and Risk Factors at Admission - 10/23/23 1305       Core Components/Risk Factors/Patient Goals on Admission    Weight Management Yes;Weight Loss    Intervention Weight Management: Develop a combined nutrition and exercise program designed to reach desired caloric intake, while maintaining appropriate intake of nutrient and fiber, sodium and fats, and appropriate energy expenditure required for the weight goal.;Weight Management: Provide education and appropriate resources to help participant work on and attain dietary goals.;Weight Management/Obesity: Establish reasonable short term and long term weight goals.    Goal Weight: Long Term 140 lb (63.5 kg)    Expected Outcomes Short Term: Continue to assess and modify interventions until short term weight is achieved;Long Term: Adherence to nutrition and physical activity/exercise program aimed toward attainment of established weight goal;Weight Loss: Understanding of general recommendations for a balanced deficit meal plan, which promotes 1-2 lb weight loss per week and includes a negative energy balance of [phone] kcal/d;Understanding recommendations for meals to include 15-35% energy as protein, 25-35% energy from fat, 35-60% energy from carbohydrates, less  than 200mg  of dietary cholesterol, 20-35 gm of total fiber daily;Understanding of distribution of calorie intake throughout the day with the consumption of 4-5 meals/snacks    Diabetes Yes    Intervention Provide education about signs/symptoms and action to take for hypo/hyperglycemia.;Provide education about proper nutrition, including hydration, and aerobic/resistive exercise prescription along with prescribed medications to achieve blood glucose in normal ranges: Fasting glucose 65-99 mg/dL    Expected Outcomes Short Term: Participant verbalizes understanding of the signs/symptoms and immediate care of  hyper/hypoglycemia, proper foot care and importance of medication, aerobic/resistive exercise and nutrition plan for blood glucose control.;Long Term: Attainment of HbA1C < 7%.    Lipids Yes    Intervention Provide education and support for participant on nutrition & aerobic/resistive exercise along with prescribed medications to achieve LDL 70mg , HDL >40mg .    Expected Outcomes Short Term: Participant states understanding of desired cholesterol values and is compliant with medications prescribed. Participant is following exercise prescription and nutrition guidelines.;Long Term: Cholesterol controlled with medications as prescribed, with individualized exercise RX and with personalized nutrition plan. Value goals: LDL < 70mg , HDL > 40 mg.          Education:Diabetes - Individual verbal and written instruction to review signs/symptoms of diabetes, desired ranges of glucose level fasting, after meals and with exercise. Acknowledge that pre and post exercise glucose checks will be done for 3 sessions at entry of program. Flowsheet Row Cardiac Rehab from 10/24/2023 in [facility] Cardiac and Pulmonary Rehab  Date 10/23/23  Educator [facility]  Instruction Review Code 1- Verbalizes Understanding    Core Components/Risk Factors/Patient Goals Review:   Goals and Risk Factor Review     Row Name 11/26/23 0947             Core Components/Risk Factors/Patient Goals Review   Personal Goals Review Weight Management/Obesity;Diabetes       Review Brandi Mahoney is still trying to lose about 10 pounds, she continues to diet and attend rehab in order to achieve her goals. She continues to check her blood sugar, but admits not everyday. Talked with patient about the importance of checking blood sugar everyday.       Expected Outcomes Short: Start checking blood sugar everyday. Long: Continue to exercise and manage diet in order to lose weight and keep blood sugar levels within a healthy range.          Core Components/Risk  Factors/Patient Goals at Discharge (Final Review):   Goals and Risk Factor Review - 11/26/23 0947       Core Components/Risk Factors/Patient Goals Review   Personal Goals Review Weight Management/Obesity;Diabetes    Review Brandi Mahoney is still trying to lose about 10 pounds, she continues to diet and attend rehab in order to achieve her goals. She continues to check her blood sugar, but admits not everyday. Talked with patient about the importance of checking blood sugar everyday.    Expected Outcomes Short: Start checking blood sugar everyday. Long: Continue to exercise and manage diet in order to lose weight and keep blood sugar levels within a healthy range.          ITP Comments:  ITP Comments     Row Name 10/23/23 1302 10/24/23 1126 10/29/23 0954 11/21/23 0934 12/19/23 0814   ITP Comments Initial phone call completed. Diagnosis can be found in [facility] 4/8. EP Orientation scheduled for Wednesday 5/7 9:30. Completed and gym orientation for cardiac rehab. Initial ITP created and sent for review to Dr. Oneil Pinal, Medical Director.  First full day of exercise!  Patient was oriented to gym and equipment including functions, settings, policies, and procedures.  Patient's individual exercise prescription and treatment plan were reviewed.  All starting workloads were established based on the results of the 6 minute walk test done at initial orientation visit.  The plan for exercise progression was also introduced and progression will be customized based on patient's performance and goals. 30 Day review completed. Medical Director ITP review done, changes made as directed, and signed approval by Medical Director.    new to program 30 Day review completed. Medical Director ITP review done, changes made as directed, and signed approval by Medical Director.      Comments: 30 day review

## 2023-12-24 ENCOUNTER — Encounter

## 2023-12-26 ENCOUNTER — Encounter

## 2023-12-26 DIAGNOSIS — Z955 Presence of coronary angioplasty implant and graft: Secondary | ICD-10-CM

## 2023-12-26 NOTE — Progress Notes (Signed)
 Daily Session Note  Patient Details  Name: Brandi Mahoney MRN: [id number] Date of Birth: Mar 17, 1956 Referring Provider:   Flowsheet Row Cardiac Rehab from 10/24/2023 in [facility] Cardiac and Pulmonary Rehab  Referring Provider Dr. Evalene Lunger, MD    Encounter Date: 12/26/2023  Check In:  Session Check In - 12/26/23 0917       Check-In   Supervising physician immediately available to respond to emergencies See telemetry face sheet for immediately available ER MD    Location [facility]-Cardiac & Pulmonary Rehab    Staff Present Burnard Davenport RN,BSN,MPA;Joseph [facility] RCP,RRT,BSRT;Margaret Best, MS, Exercise Physiologist;Jason Elnor [facility]    Virtual Visit No    Medication changes reported     No    Fall or balance concerns reported    No    Warm-up and Cool-down Performed on first and last piece of equipment    Resistance Training Performed Yes    VAD Patient? No    PAD/SET Patient? No      Pain Assessment   Currently in Pain? No/denies             Social History   Tobacco Use  Smoking Status Former   Current packs/day: 0.00   Average packs/day: 0.4 packs/day for 35.0 years (12.3 ttl pk-yrs)   Types: Cigarettes   Start date: 09/25/1973   Quit date: 09/25/2008   Years since quitting: 15.2  Smokeless Tobacco Never  Tobacco Comments   quit in 2007-2008, social smoker    Goals Met:  Independence with exercise equipment Exercise tolerated well No report of concerns or symptoms today Strength training completed today  Goals Unmet:  Not Applicable  Comments: Pt able to follow exercise prescription today without complaint.  Will continue to monitor for progression.    Dr. Oneil Pinal is Medical Director for [facility] Cardiac Rehabilitation.  Dr. Fuad Aleskerov is Medical Director for [facility] Pulmonary Rehabilitation.

## 2023-12-27 ENCOUNTER — Ambulatory Visit: Admitting: Physician Assistant

## 2023-12-28 ENCOUNTER — Encounter

## 2023-12-28 ENCOUNTER — Ambulatory Visit: Attending: Physician Assistant | Admitting: Physician Assistant

## 2023-12-28 ENCOUNTER — Encounter: Payer: Self-pay | Admitting: Physician Assistant

## 2023-12-28 VITALS — BP 126/72 | HR 78 | Ht 64.0 in | Wt 159.0 lb

## 2023-12-28 DIAGNOSIS — E785 Hyperlipidemia, unspecified: Secondary | ICD-10-CM | POA: Diagnosis not present

## 2023-12-28 DIAGNOSIS — I729 Aneurysm of unspecified site: Secondary | ICD-10-CM | POA: Diagnosis not present

## 2023-12-28 DIAGNOSIS — I25119 Atherosclerotic heart disease of native coronary artery with unspecified angina pectoris: Secondary | ICD-10-CM

## 2023-12-28 DIAGNOSIS — E781 Pure hyperglyceridemia: Secondary | ICD-10-CM | POA: Diagnosis not present

## 2023-12-28 MED ORDER — AMLODIPINE BESYLATE 2.5 MG PO TABS: 2.5000 mg | ORAL_TABLET | Freq: Every day | ORAL | 1 refills | Status: DC

## 2023-12-28 NOTE — Patient Instructions (Addendum)
 Medication Instructions:  Your physician recommends the following medication changes.  START TAKING: AmLODipine  2.5 mg by mouth daily  *If you need a refill on your cardiac medications before your next appointment, please call your pharmacy*  Lab Work:  No labs ordered today   If you have labs (blood work) drawn today and your tests are completely normal, you will receive your results only by: MyChart Message (if you have MyChart) OR A paper copy in the mail If you have any lab test that is abnormal or we need to change your treatment, we will call you to review the results.  Testing/Procedures:  No test ordered today   Follow-Up: At [facility], you and your health needs are our priority.  As part of our continuing mission to provide you with exceptional heart care, our providers are all part of one team.  This team includes your primary Cardiologist (physician) and Advanced Practice Providers or APPs (Physician Assistants and Nurse Practitioners) who all work together to provide you with the care you need, when you need it.  Your next appointment:    1 month(s)  Provider:    Bernardino Bring, PA-C

## 2023-12-28 NOTE — Progress Notes (Signed)
 Cardiology Office Note    Date:  12/28/2023   ID:  SHERBY MONCAYO, DOB 10-Jul-1955, MRN [id number]  PCP:  Watt Mirza, MD  Cardiologist:  Evalene Lunger, MD  Electrophysiologist:  None   Chief Complaint: Follow up  History of Present Illness:   Brandi Mahoney is a 68 y.o. 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  Brandi Mahoney was admitted in 2011 with chest pain and elevated troponin.  Cardiac cath showed mild CAD.  Presentation was felt to be secondary to viral myocarditis.  Echo in 09/2017 showed an EF of 65 to 70% with no significant valvular disease.  Nuclear stress test in 10/2019 was without ischemia.  Previously followed by Dr. Gollan, last seeing him in 2022.   Brandi Mahoney was seen in the ED in 08/2023 with chest pain.  Troponin negative.  EKG showed sinus rhythm with a new left bundle branch block.  CTA chest was negative for PE or aortic dissection.  Brandi Mahoney followed up with Dr. Verlin in 09/2023 noting progressive exertional chest pressure and dyspnea, concerning for unstable angina.  In this setting Brandi Mahoney underwent LHC on 09/25/2023 that showed 85% stenosis in the proximal LAD which was treated successfully with PCI/DES.  In the setting of arteriotomy site discomfort Brandi Mahoney underwent ultrasound of the right upper extremity which showed a pseudoaneurysm measuring 3 x 1 cm that was not amenable to thrombin injection.  Brandi Mahoney underwent ultrasound-guided compression without resolution or thrombosis.  Brandi Mahoney was evaluated by vascular surgery and underwent ulnar pseudoaneurysm repair without complications.  Echo in 10/2023 showed an EF of 55 to 60%, no regional wall motion abnormalities, grade 1 diastolic dysfunction, normal RV systolic function, ventricular cavity size, and RVSP, moderate mitral valve regurgitation, and an estimated  right atrial pressure of 8 mmHg.  Brandi Mahoney followed up with vascular surgery with repeat right upper extremity arterial duplex showing no obstruction visualized in the right upper extremity with widely patent right ulnar artery with no further intervention indicated.  Brandi Mahoney was last seen in our office on 11/29/2023 and was without symptoms of angina or cardiac decompensation.  Brandi Mahoney reported a longstanding history of dyspnea and dry cough with high-resolution CT without evidence of ILD and findings consistent of small airway disease.  Brandi Mahoney was seen in the ED on 12/11/2023 with intermittent twinges of chest pain and a stronger sensation when taking a deep breath.  EKG showed sinus rhythm with known LBBB.  High-sensitivity troponin negative x 2.  D-dimer negative.  Brandi Mahoney was discharged to outpatient follow-up.  Brandi Mahoney comes in doing well from a cardiac perspective and is without symptoms of angina or cardiac decompensation.  Brandi Mahoney reports 3 episodes of random occurring sharp chest discomfort described as sharp and lasting for 4 to 5 seconds with spontaneous resolution.  Brandi Mahoney has been without further episodes.  No presyncope or syncope.  Some mild generalized queasiness.  No falls or symptoms concerning for bleeding.  Adherent to cardiac pharmacotherapy.   Labs independently reviewed: 11/2023 - Hgb 11.9, PLT 325, potassium 3.7, BUN 16, serum creatinine 0.61, direct LDL 19, TC 80, TG 130, HDL 36, albumin  4.6, AST/ALT normal 07/2023 - A1c 6.4 11/2022 - TSH normal  Past Medical History:  Diagnosis Date   CAD (coronary artery disease)    a. 01/2010 Cath: mild atherosclerosis in the mid LCX after takeoff  of OM1, mild diff LAD dzs;  b. 07/2013 MV: EF 67%, no ischemia.   GAD (generalized anxiety disorder)    History of kidney stones    HLD (hyperlipidemia)    Hypothyroidism    Major depressive disorder, recurrent episode, in full remission (HCC) 09/08/2008   Qualifier: Diagnosis of  By: Watt MD, Spencer     Migraine headache     Myocarditis (HCC)    a. 01/2010 presented w/ c/p and elev trop-->Cath: mild atherosclerosis in the mid LCX after takeoff of OM1, mild diff LAD dzs;  b. 03/2010 Echo: EF 60-65%, DD, mild LVH;  c. 07/2011 Echo: EF 55-60%, mild LVH, no rwma.   Sleep apnea    has c-pap.  wears off and on - not lately  using  per reported 07-29-2020   Type 2 diabetes, diet controlled [facility])     Past Surgical History:  Procedure Laterality Date   APPENDECTOMY  1974   BREAST SURGERY Right 1988   fatty tumore from breast   CARDIAC CATHETERIZATION  01/2010   no significant -[facility] Dr Perla,   CHOLECYSTECTOMY  2002   COLONOSCOPY  2017   CORONARY STENT INTERVENTION N/A 09/25/2023   Procedure: CORONARY STENT INTERVENTION;  Surgeon: Swaziland, Peter M, MD;  Location: [facility] INVASIVE CV LAB;  Service: Cardiovascular;  Laterality: N/A;   CYST EXCISION     ovarian cyst 1994   INSERTION OF ARTERIOVENOUS (AV) ARTEGRAFT ARM Right 09/27/2023   Procedure: 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;  Surgeon: Pearline Norman RAMAN, MD;  Location[facility] OR;  Service: Vascular;  Laterality: Right;   LEFT HEART CATH AND CORONARY ANGIOGRAPHY N/A 09/25/2023   Procedure: LEFT HEART CATH AND CORONARY ANGIOGRAPHY;  Surgeon: Swaziland, Peter M, MD;  Location: [facility] INVASIVE CV LAB;  Service: Cardiovascular;  Laterality: N/A;   NEPHROLITHOTOMY Right 08/20/2014   Procedure: RIGHT PERCUTANEOUS NEPHROLITHOTOMY ;  Surgeon: Norleen JINNY Seltzer, MD;  Location: [facility] ORS;  Service: Urology;  Laterality: Right;   POLYPECTOMY     RECTAL EXAM UNDER ANESTHESIA N/A 08/02/2020   Procedure: DONNY EXAM UNDER ANESTHESIA INJECTION OF PERIANAL BOTOX ;  Surgeon: Teresa Lonni HERO, MD;  Location: [facility];  Service: General;  Laterality: N/A;   TOTAL ABDOMINAL HYSTERECTOMY W/ BILATERAL SALPINGOOPHORECTOMY  2000   no ovaries, uterus, or cervix.    Current Medications: Current Meds  Medication Sig   albuterol  (VENTOLIN  HFA) 108 (90 Base) MCG/ACT  inhaler Inhale 2 puffs into the lungs every 6 (six) hours as needed for wheezing or shortness of breath.   amLODipine  (NORVASC ) 2.5 MG tablet Take 1 tablet (2.5 mg total) by mouth daily.   aspirin  EC 81 MG tablet Take 81 mg by mouth daily.   clobetasol  ointment (TEMOVATE ) 0.05 % Apply 1 Application topically daily as needed (skin conditions).   fenofibrate  160 MG tablet Take 1 tablet (160 mg total) by mouth daily.   fluticasone (FLONASE) 50 MCG/ACT nasal spray Place 2 sprays into both nostrils in the morning and at bedtime.   glucose blood (ACCU-CHEK GUIDE) test strip Use to check blood sugar daily   Lactobacillus-Inulin (CULTURELLE ADULT ULT BALANCE PO) Take 1 tablet by mouth daily.   levothyroxine  (SYNTHROID ) 75 MCG tablet TAKE ONE TABLET BY MOUTH EVERY MORNING BEFORE BREAKFAST   Magnesium  250 MG TABS Take 250 mg by mouth daily.   metFORMIN  (GLUCOPHAGE -XR) 500 MG 24 hr tablet Take 1,000 mg by mouth 2 (two) times daily with a meal.   Multiple Vitamins-Minerals (ONE-A-DAY  WOMENS PETITES PO) Take 1 tablet by mouth daily.   nitroGLYCERIN  (NITROSTAT ) 0.4 MG SL tablet Place 1 tablet under tongue at onset of chest pain; you may repeat every 5 minutes for up to 3 doses.   pantoprazole  (PROTONIX ) 40 MG tablet Take 1 tablet (40 mg total) by mouth daily.   prasugrel  (EFFIENT ) 10 MG TABS tablet Take 1 tablet (10 mg total) by mouth daily.   rosuvastatin  (CRESTOR ) 40 MG tablet Take 1 tablet (40 mg total) by mouth daily.   sertraline  (ZOLOFT ) 50 MG tablet TAKE 1 TABLET BY MOUTH DAILY   traMADol (ULTRAM) 50 MG tablet Take 50 mg by mouth 2 (two) times daily as needed.   traZODone  (DESYREL ) 100 MG tablet TAKE 1 TABLET BY MOUTH AT BEDTIME    Allergies:   Cat dander, Pseudoeph-doxylamine-dm-apap, Gluten, and Penicillins   Social History   Socioeconomic History   Marital status: Married    Spouse name: Koren   Number of children: 3   Years of education: Not on file   Highest education level: Not on file   Occupational History   Occupation: retired    Comment: [employer]  Tobacco Use   Smoking status: Former    Current packs/day: 0.00    Average packs/day: 0.4 packs/day for 35.0 years (12.3 ttl pk-yrs)    Types: Cigarettes    Start date: 09/25/1973    Quit date: 09/25/2008    Years since quitting: 15.2   Smokeless tobacco: Never   Tobacco comments:    quit in 2007-2008, social smoker  Vaping Use   Vaping status: Never Used  Substance and Sexual Activity   Alcohol use: Not Currently   Drug use: No   Sexual activity: Not Currently  Other Topics Concern   Not on file  Social History Narrative   Lives in [place] w/ husband.  No reg exercise.   Social Drivers of Corporate investment banker Strain: Not on file  Food Insecurity: No Food Insecurity (09/26/2023)   Hunger Vital Sign    Worried About Running Out of Food in the Last Year: Never true    Ran Out of Food in the Last Year: Never true  Transportation Needs: No Transportation Needs (09/26/2023)   PRAPARE - Administrator, Civil Service (Medical): No    Lack of Transportation (Non-Medical): No  Physical Activity: Not on file  Stress: Not on file  Social Connections: Socially Integrated (09/26/2023)   Social Connection and Isolation Panel    Frequency of Communication with Friends and Family: More than three times a week    Frequency of Social Gatherings with Friends and Family: Once a week    Attends Religious Services: More than 4 times per year    Active Member of [employer] or Organizations: Yes    Attends Engineer, structural: More than 4 times per year    Marital Status: Married     Family History:  The patient's family history includes ALS in Brandi Mahoney mother; Breast cancer in Brandi Mahoney paternal grandmother; Breast cancer (age of onset: 66) in Brandi Mahoney sister; Colon cancer in Brandi Mahoney maternal grandfather; Colon cancer (age of onset: 26) in Brandi Mahoney son; Coronary artery disease in Brandi Mahoney mother; Diabetes in Brandi Mahoney mother; Down syndrome in  Brandi Mahoney son; Hyperlipidemia in Brandi Mahoney father; Lung cancer in Brandi Mahoney paternal grandfather; Other in Brandi Mahoney daughter; Stomach cancer in Brandi Mahoney maternal grandfather. There is no history of Esophageal cancer, Liver cancer, Pancreatic cancer, or Rectal cancer.  ROS:  12-point review of systems is negative unless otherwise noted in the HPI.   EKGs/Labs/Other Studies Reviewed:    Studies reviewed were summarized above. The additional studies were reviewed today:  2D echo 10/23/2023: 1. Left ventricular ejection fraction, by estimation, is 55 to 60%. Left  ventricular ejection fraction by 3D volume is 63 %. The left ventricle has  normal function. The left ventricle has no regional wall motion  abnormalities. Left ventricular diastolic   parameters are consistent with Grade I diastolic dysfunction (impaired  relaxation).   2. Right ventricular systolic function is normal. The right ventricular  size is normal. There is normal pulmonary artery systolic pressure. The  estimated right ventricular systolic pressure is 33.0 mmHg.   3. Left atrial size was mild to moderately dilated.   4. The mitral valve is normal in structure. Moderate mitral valve  regurgitation. No evidence of mitral stenosis.   5. The aortic valve has an indeterminant number of cusps. Aortic valve  regurgitation is not visualized. No aortic stenosis is present.   6. The inferior vena cava is dilated in size with >50% respiratory  variability, suggesting right atrial pressure of 8 mmHg.  __________   LHC 09/25/2023: Diagnostic Dominance: Right Left Main  Vessel was injected. Vessel is normal in caliber. Vessel is angiographically normal.    Left Anterior Descending  Prox LAD lesion is 85% stenosed. The lesion is focal.    Left Circumflex  Vessel was injected. Vessel is normal in caliber. The vessel exhibits minimal luminal irregularities.    Right Coronary Artery  Vessel was injected. Vessel is large. Vessel is angiographically normal.           Single vessel obstructive CAD involving the proximal LAD Normal LV function Normal LVEDP Successful PCI of the proximal LAD with DES x 1   Plan: DAPT for one year. Anticipate same day DC __________   Lexiscan  MPI 11/08/2018: Pharmacological myocardial perfusion imaging study with no significant  Ischemia Breast attenuation artifact noted Normal wall motion, EF could not be estimated secondary to technical issues No EKG changes concerning for ischemia at peak stress or in recovery. Low risk scan __________   2D echo 10/05/2017: - Left ventricle: The cavity size was normal. There was mild    concentric hypertrophy. Systolic function was vigorous. The    estimated ejection fraction was in the range of 65% to 70%. Wall    motion was normal; there were no regional wall motion    abnormalities. Doppler parameters are consistent with abnormal    left ventricular relaxation (grade 1 diastolic dysfunction).  __________   See CV Studies in Epic for more remote cardiac imaging   EKG:  EKG is ordered today.  The EKG ordered today demonstrates NSR, 70 bpm, LBBB  Recent Labs: 11/30/2023: ALT 18 12/11/2023: BUN 16; Creatinine, Ser 0.61; Hemoglobin 11.9; Platelets 325; Potassium 3.7; Sodium 137  Recent Lipid Panel    Component Value Date/Time   CHOL 80 (L) 11/30/2023 0903   CHOL 151 10/17/2011 0826   TRIG 130 11/30/2023 0903   TRIG 136 10/17/2011 0826   HDL 36 (L) 11/30/2023 0903   HDL 44 10/17/2011 0826   CHOLHDL 2.2 11/30/2023 0903   CHOLHDL 3 11/22/2022 1558   VLDL 50.0 (H) 11/22/2022 1558   VLDL 27 10/17/2011 0826   LDLCALC 21 11/30/2023 0903   LDLCALC 80 10/17/2011 0826   LDLDIRECT 19 11/30/2023 0903   LDLDIRECT 62.0 11/22/2022 1558    PHYSICAL EXAM:  VS:  BP 126/72   Pulse 78   Ht 5' 4 (1.626 m)   Wt 159 lb (72.1 kg)   SpO2 94%   BMI 27.29 kg/m   BMI: Body mass index is 27.29 kg/m.  Physical Exam Vitals reviewed.  Constitutional:      Appearance:  Brandi Mahoney is well-developed.  HENT:     Head: Normocephalic and atraumatic.  Eyes:     General:        Right eye: No discharge.        Left eye: No discharge.  Cardiovascular:     Rate and Rhythm: Normal rate and regular rhythm.     Pulses:          Posterior tibial pulses are 2+ on the right side and 2+ on the left side.     Heart sounds: S1 normal and S2 normal. Heart sounds not distant. No midsystolic click and no opening snap. Murmur heard.     Systolic murmur is present with a grade of 2/6 at the upper left sternal border.     No friction rub.  Pulmonary:     Effort: Pulmonary effort is normal. No respiratory distress.     Breath sounds: Normal breath sounds. No decreased breath sounds, wheezing, rhonchi or rales.  Chest:     Chest wall: No tenderness.  Musculoskeletal:     Cervical back: Normal range of motion.     Right lower leg: No edema.     Left lower leg: No edema.  Skin:    General: Skin is warm and dry.     Nails: There is no clubbing.  Neurological:     Mental Status: Brandi Mahoney is alert and oriented to person, place, and time.  Psychiatric:        Speech: Speech normal.        Behavior: Behavior normal.        Thought Content: Thought content normal.        Judgment: Judgment normal.     Wt Readings from Last 3 Encounters:  12/28/23 159 lb (72.1 kg)  12/11/23 157 lb (71.2 kg)  11/29/23 157 lb 9.6 oz (71.5 kg)     ASSESSMENT & PLAN:   CAD involving the native coronary arteries with other forms of chest pain: Currently without symptoms of angina or cardiac decompensation.  Recent PCI/DES to the LAD in 09/2023 with otherwise normal coronary arteries.  Query if Brandi Mahoney episodes of sharp chest discomfort that were lasting several seconds in duration are related to spasm.  Trial of amlodipine  2.5 mg daily.  Brandi Mahoney will otherwise continue DAPT with aspirin  81 mg and Effient  10 mg daily without interruption for a minimum of 12 months edema due to PCI (/01/2024).  Continue fenofibrate ,  rosuvastatin , and as needed SL NTG.  HLD/hypertriglyceridemia: LDL 19 with triglyceride 130 in 11/2023.  Brandi Mahoney remains on fenofibrate  160 mg and rosuvastatin  40 mg.  Right radial artery pseudoaneurysm: Status post surgical repair.     Disposition: F/u with Dr. Gollan or an APP in 1 month.   Medication Adjustments/Labs and Tests Ordered: Current medicines are reviewed at length with the patient today.  Concerns regarding medicines are outlined above. Medication changes, Labs and Tests ordered today are summarized above and listed in the Patient Instructions accessible in Encounters.   Signed, Bernardino Bring, PA-C 12/28/2023 12:37 PM     [facility] [place] [address] [place], [place] [address] [phone]

## 2023-12-31 ENCOUNTER — Encounter

## 2024-01-01 ENCOUNTER — Ambulatory Visit: Admitting: Physician Assistant

## 2024-01-02 ENCOUNTER — Encounter

## 2024-01-04 ENCOUNTER — Encounter

## 2024-01-07 ENCOUNTER — Encounter

## 2024-01-09 ENCOUNTER — Other Ambulatory Visit: Payer: Self-pay | Admitting: Family Medicine

## 2024-01-09 ENCOUNTER — Encounter

## 2024-01-09 DIAGNOSIS — Z955 Presence of coronary angioplasty implant and graft: Secondary | ICD-10-CM | POA: Diagnosis not present

## 2024-01-09 NOTE — Progress Notes (Signed)
 Daily Session Note  Patient Details  Name: Brandi Mahoney MRN: [id number] Date of Birth: Apr 27, 1956 Referring Provider:   Flowsheet Row Cardiac Rehab from 10/24/2023 in [facility] Cardiac and Pulmonary Rehab  Referring Provider Dr. Evalene Lunger, MD    Encounter Date: 01/09/2024  Check In:  Session Check In - 01/09/24 0927       Check-In   Supervising physician immediately available to respond to emergencies See telemetry face sheet for immediately available ER MD    Location [facility]-Cardiac & Pulmonary Rehab    Staff Present Burnard Davenport RN,BSN,MPA;Joseph [facility] RCP,RRT,BSRT;Margaret Best, MS, Exercise Physiologist;Jason Elnor RDN,LDN;Noah Tickle, BS, Exercise Physiologist    Virtual Visit No    Medication changes reported     No    Fall or balance concerns reported    No    Warm-up and Cool-down Performed on first and last piece of equipment    Resistance Training Performed Yes    VAD Patient? No    PAD/SET Patient? No      Pain Assessment   Currently in Pain? No/denies             Social History   Tobacco Use  Smoking Status Former   Current packs/day: 0.00   Average packs/day: 0.4 packs/day for 35.0 years (12.3 ttl pk-yrs)   Types: Cigarettes   Start date: 09/25/1973   Quit date: 09/25/2008   Years since quitting: 15.2  Smokeless Tobacco Never  Tobacco Comments   quit in 2007-2008, social smoker    Goals Met:  Independence with exercise equipment Exercise tolerated well No report of concerns or symptoms today Strength training completed today  Goals Unmet:  Not Applicable  Comments: Pt able to follow exercise prescription today without complaint.  Will continue to monitor for progression.    Dr. Oneil Pinal is Medical Director for [facility] Cardiac Rehabilitation.  Dr. Fuad Aleskerov is Medical Director for [facility].

## 2024-01-11 ENCOUNTER — Encounter

## 2024-01-14 ENCOUNTER — Encounter

## 2024-01-16 ENCOUNTER — Encounter

## 2024-01-16 DIAGNOSIS — Z955 Presence of coronary angioplasty implant and graft: Secondary | ICD-10-CM

## 2024-01-16 NOTE — Progress Notes (Signed)
 Daily Session Note  Patient Details  Name: Brandi Mahoney MRN: [id number] Date of Birth: 1956-04-13 Referring Provider:   Flowsheet Row Cardiac Rehab from 10/24/2023 in [facility] Cardiac and Pulmonary Rehab  Referring Provider Dr. Evalene Lunger, MD    Encounter Date: 01/16/2024  Check In:  Session Check In - 01/16/24 0919       Check-In   Supervising physician immediately available to respond to emergencies See telemetry face sheet for immediately available ER MD    Location [facility]-Cardiac & Pulmonary Rehab    Staff Present Burnard Davenport RN,BSN,MPA;Joseph [facility] RCP,RRT,BSRT;Margaret Best, MS, Exercise Physiologist;Jason Elnor RDN,LDN;Noah Tickle, BS, Exercise Physiologist    Virtual Visit No    Medication changes reported     No    Fall or balance concerns reported    No    Warm-up and Cool-down Performed on first and last piece of equipment    Resistance Training Performed Yes    VAD Patient? No    PAD/SET Patient? No      Pain Assessment   Currently in Pain? No/denies             Social History   Tobacco Use  Smoking Status Former   Current packs/day: 0.00   Average packs/day: 0.4 packs/day for 35.0 years (12.3 ttl pk-yrs)   Types: Cigarettes   Start date: 09/25/1973   Quit date: 09/25/2008   Years since quitting: 15.3  Smokeless Tobacco Never  Tobacco Comments   quit in 2007-2008, social smoker    Goals Met:  Independence with exercise equipment Exercise tolerated well No report of concerns or symptoms today Strength training completed today  Goals Unmet:  Not Applicable  Comments: Pt able to follow exercise prescription today without complaint.  Will continue to monitor for progression.    Dr. Oneil Pinal is Medical Director for [facility] Cardiac Rehabilitation.  Dr. Fuad Aleskerov is Medical Director for [facility] Pulmonary Rehabilitation.

## 2024-01-16 NOTE — Progress Notes (Signed)
 Cardiac Individual Treatment Plan  Patient Details  Name: Brandi Mahoney MRN: [id number] Date of Birth: 1956-04-28 Referring Provider:   Flowsheet Row Cardiac Rehab from 10/24/2023 in [facility] Cardiac and Pulmonary Rehab  Referring Provider Dr. Evalene Lunger, MD    Initial Encounter Date:  Flowsheet Row Cardiac Rehab from 10/24/2023 in [facility] Cardiac and Pulmonary Rehab  Date 10/24/23    Visit Diagnosis: Status post coronary artery stent placement  Patient's Home Medications on Admission:  Current Outpatient Medications:    Accu-Chek Softclix Lancets lancets, Use to check blood sugar daily, Disp: 100 each, Rfl: 3   albuterol  (VENTOLIN  HFA) 108 (90 Base) MCG/ACT inhaler, Inhale 2 puffs into the lungs every 6 (six) hours as needed for wheezing or shortness of breath., Disp: 8 g, Rfl: 2   amLODipine  (NORVASC ) 2.5 MG tablet, Take 1 tablet (2.5 mg total) by mouth daily., Disp: 30 tablet, Rfl: 1   aspirin  EC 81 MG tablet, Take 81 mg by mouth daily., Disp: , Rfl:    clobetasol  ointment (TEMOVATE ) 0.05 %, Apply 1 Application topically daily as needed (skin conditions)., Disp: , Rfl:    fenofibrate  160 MG tablet, Take 1 tablet (160 mg total) by mouth daily., Disp: 90 tablet, Rfl: 3   fluticasone (FLONASE) 50 MCG/ACT nasal spray, Place 2 sprays into both nostrils in the morning and at bedtime., Disp: , Rfl:    gabapentin  (NEURONTIN ) 300 MG capsule, Take 300 mg by mouth at bedtime. (Patient not taking: Reported on 12/28/2023), Disp: , Rfl:    glucose blood (ACCU-CHEK GUIDE) test strip, Use to check blood sugar daily, Disp: 100 each, Rfl: 3   Lactobacillus-Inulin (CULTURELLE ADULT ULT BALANCE PO), Take 1 tablet by mouth daily., Disp: , Rfl:    levothyroxine  (SYNTHROID ) 75 MCG tablet, TAKE ONE TABLET BY MOUTH EVERY MORNING BEFORE BREAKFAST, Disp: 90 tablet, Rfl: 3   Magnesium  250 MG TABS, Take 250 mg by mouth daily., Disp: , Rfl:    metFORMIN  (GLUCOPHAGE -XR) 500 MG 24 hr tablet, Take 1,000 mg by mouth 2  (two) times daily with a meal., Disp: , Rfl:    metoprolol  succinate (TOPROL -XL) 50 MG 24 hr tablet, Take 1 tablet (50 mg total) by mouth daily. Take with or immediately following a meal. (Patient not taking: Reported on 12/28/2023), Disp: 90 tablet, Rfl: 3   Multiple Vitamins-Minerals (ONE-A-DAY WOMENS PETITES PO), Take 1 tablet by mouth daily., Disp: , Rfl:    nitroGLYCERIN  (NITROSTAT ) 0.4 MG SL tablet, Place 1 tablet under tongue at onset of chest pain; you may repeat every 5 minutes for up to 3 doses., Disp: 25 tablet, Rfl: 3   pantoprazole  (PROTONIX ) 40 MG tablet, Take 1 tablet (40 mg total) by mouth daily., Disp: 90 tablet, Rfl: 3   prasugrel  (EFFIENT ) 10 MG TABS tablet, Take 1 tablet (10 mg total) by mouth daily., Disp: 30 tablet, Rfl: 11   rosuvastatin  (CRESTOR ) 40 MG tablet, Take 1 tablet (40 mg total) by mouth daily., Disp: 90 tablet, Rfl: 3   sertraline  (ZOLOFT ) 50 MG tablet, TAKE 1 TABLET BY MOUTH DAILY, Disp: 90 tablet, Rfl: 0   traMADol (ULTRAM) 50 MG tablet, Take 50 mg by mouth 2 (two) times daily as needed., Disp: , Rfl:    traZODone  (DESYREL ) 100 MG tablet, TAKE 1 TABLET BY MOUTH AT BEDTIME, Disp: 30 tablet, Rfl: 2  Past Medical History: Past Medical History:  Diagnosis Date   CAD (coronary artery disease)    a. 01/2010 Cath: mild atherosclerosis in the mid  LCX after takeoff of OM1, mild diff LAD dzs;  b. 07/2013 MV: EF 67%, no ischemia.   GAD (generalized anxiety disorder)    History of kidney stones    HLD (hyperlipidemia)    Hypothyroidism    Major depressive disorder, recurrent episode, in full remission (HCC) 09/08/2008   Qualifier: Diagnosis of  By: Watt MD, Spencer     Migraine headache    Myocarditis (HCC)    a. 01/2010 presented w/ c/p and elev trop-->Cath: mild atherosclerosis in the mid LCX after takeoff of OM1, mild diff LAD dzs;  b. 03/2010 Echo: EF 60-65%, DD, mild LVH;  c. 07/2011 Echo: EF 55-60%, mild LVH, no rwma.   Sleep apnea    has c-pap.  wears off and on  - not lately  using  per reported 07-29-2020   Type 2 diabetes, diet controlled (HCC)     Tobacco Use: Social History   Tobacco Use  Smoking Status Former   Current packs/day: 0.00   Average packs/day: 0.4 packs/day for 35.0 years (12.3 ttl pk-yrs)   Types: Cigarettes   Start date: 09/25/1973   Quit date: 09/25/2008   Years since quitting: 15.3  Smokeless Tobacco Never  Tobacco Comments   quit in 2007-2008, social smoker    Labs: Review Flowsheet  More data exists      Latest Ref Rng & Units 12/27/2021 08/23/2022 11/22/2022 08/06/2023 11/30/2023  Labs for ITP Cardiac and Pulmonary Rehab  Cholestrol 100 - 199 mg/dL - - 875  - 80   LDL (calc) 0 - 99 mg/dL - - - - 21   Direct LDL 0 - 99 mg/dL - - 37.9  - 19   HDL-C >39 mg/dL - - 59.89  - 36   Trlycerides 0 - 149 mg/dL - - 749.9  - 869   Hemoglobin A1c 4.0 - 5.6 % 7.2  8.3  7.8  6.4  -     Exercise Target Goals: Exercise Program Goal: Individual exercise prescription set using results from initial 6 min walk test and THRR while considering  patient's activity barriers and safety.   Exercise Prescription Goal: Initial exercise prescription builds to 30-45 minutes a day of aerobic activity, 2-3 days per week.  Home exercise guidelines will be given to patient during program as part of exercise prescription that the participant will acknowledge.   Education: Aerobic Exercise: - Group verbal and visual presentation on the components of exercise prescription. Introduces F.I.T.T principle from ACSM for exercise prescriptions.  Reviews F.I.T.T. principles of aerobic exercise including progression. Written material given at graduation.   Education: Resistance Exercise: - Group verbal and visual presentation on the components of exercise prescription. Introduces F.I.T.T principle from ACSM for exercise prescriptions  Reviews F.I.T.T. principles of resistance exercise including progression. Written material given at graduation.    Education:  Exercise & Equipment Safety: - Individual verbal instruction and demonstration of equipment use and safety with use of the equipment. Flowsheet Row Cardiac Rehab from 10/24/2023 in [facility] Cardiac and Pulmonary Rehab  Date 10/24/23  Educator NT  Instruction Review Code 1- Verbalizes Understanding    Education: Exercise Physiology & General Exercise Guidelines: - Group verbal and written instruction with models to review the exercise physiology of the cardiovascular system and associated critical values. Provides general exercise guidelines with specific guidelines to those with heart or lung disease.    Education: Flexibility, Balance, Mind/Body Relaxation: - Group verbal and visual presentation with interactive activity on the components of exercise prescription. Introduces  F.I.T.T principle from ACSM for exercise prescriptions. Reviews F.I.T.T. principles of flexibility and balance exercise training including progression. Also discusses the mind body connection.  Reviews various relaxation techniques to help reduce and manage stress (i.e. Deep breathing, progressive muscle relaxation, and visualization). Balance handout provided to take home. Written material given at graduation.   Activity Barriers & Risk Stratification:  Activity Barriers & Cardiac Risk Stratification - 10/23/23 1306       Activity Barriers & Cardiac Risk Stratification   Activity Barriers Back Problems;Arthritis    Cardiac Risk Stratification Moderate          6 Minute Walk:  6 Minute Walk     Row Name 10/24/23 1127         6 Minute Walk   Phase Initial     Distance 1235 feet     Walk Time 6 minutes     # of Rest Breaks 0     MPH 2.34     METS 2.96     RPE 10     Perceived Dyspnea  1     VO2 Peak 10.34     Symptoms No     Resting HR 78 bpm     Resting BP 128/64     Resting Oxygen Saturation  96 %     Exercise Oxygen Saturation  during 6 min walk 96 %     Max Ex. HR 99 bpm     Max Ex. BP 130/66      2 Minute Post BP 122/58        Oxygen Initial Assessment:   Oxygen Re-Evaluation:   Oxygen Discharge (Final Oxygen Re-Evaluation):   Initial Exercise Prescription:  Initial Exercise Prescription - 10/24/23 1100       Date of Initial Exercise RX and Referring Provider   Date 10/24/23    Referring Provider Dr. Timothy Gollan, MD      Oxygen   Maintain Oxygen Saturation 88% or higher      Treadmill   MPH 2.3    Grade 1    Minutes 15    METs 3.08      NuStep   Level 2    SPM 80    Minutes 15    METs 2.96      REL-XR   Level 2    Speed 50    Minutes 15    METs 2.96      Prescription Details   Frequency (times per week) 3    Duration Progress to 30 minutes of continuous aerobic without signs/symptoms of physical distress      Intensity   THRR 40-80% of Max Heartrate 108-138    Ratings of Perceived Exertion 11-13    Perceived Dyspnea 0-4      Progression   Progression Continue to progress workloads to maintain intensity without signs/symptoms of physical distress.      Resistance Training   Training Prescription Yes    Weight 4 lb    Reps 10-15          Perform Capillary Blood Glucose checks as needed.  Exercise Prescription Changes:   Exercise Prescription Changes     Row Name 10/24/23 1100 11/13/23 1400 11/29/23 0800 12/12/23 0800 12/27/23 1600     Response to Exercise   Blood Pressure (Admit) 128/64 118/60 124/62 134/63 128/64   Blood Pressure (Exercise) 130/66 134/68 148/70 132/52 --   Blood Pressure (Exit) 122/58 118/62 122/64 120/60 110/60   Heart Rate (Admit) 78 bpm  77 bpm 80 bpm 74 bpm 77 bpm   Heart Rate (Exercise) 99 bpm 110 bpm 103 bpm 105 bpm 117 bpm   Heart Rate (Exit) 75 bpm 64 bpm 78 bpm 73 bpm 68 bpm   Oxygen Saturation (Admit) 96 % -- -- -- --   Oxygen Saturation (Exercise) 96 % -- -- -- --   Rating of Perceived Exertion (Exercise) 10 13 13 12 13    Perceived Dyspnea (Exercise) 1 0 -- -- --   Symptoms none none none none none    Comments Results first 2 weeks of exercise -- -- --   Duration -- Progress to 30 minutes of  aerobic without signs/symptoms of physical distress Continue with 30 min of aerobic exercise without signs/symptoms of physical distress. Continue with 30 min of aerobic exercise without signs/symptoms of physical distress. Continue with 30 min of aerobic exercise without signs/symptoms of physical distress.   Intensity -- THRR unchanged THRR unchanged THRR unchanged THRR unchanged     Progression   Progression -- Continue to progress workloads to maintain intensity without signs/symptoms of physical distress. Continue to progress workloads to maintain intensity without signs/symptoms of physical distress. Continue to progress workloads to maintain intensity without signs/symptoms of physical distress. Continue to progress workloads to maintain intensity without signs/symptoms of physical distress.   Average METs -- 2.4 3.34 2.6 2.9     Resistance Training   Training Prescription -- Yes Yes Yes Yes   Weight -- 4 lb 4 lb 4 lb 4 lb   Reps -- 10-15 10-15 10-15 10-15     Interval Training   Interval Training -- No No No No     Treadmill   MPH -- 2.2 2.2 2 2.2   Grade -- 1 1 0 0   Minutes -- 15 15 15 15    METs -- 2.99 2.99 2.53 2.69     NuStep   Level -- 2  T6 -- -- --   Minutes -- 15 -- -- --   METs -- 1.7 -- -- --     Elliptical   Level -- -- -- -- 1   Minutes -- -- -- -- 15   METs -- -- -- -- 3.7     REL-XR   Level -- 1 -- 1 2   Minutes -- 15 -- 15 15   METs -- 1.4 -- 2.3 3     T5 Nustep   Level -- -- -- 1 --   Minutes -- -- -- 15 --   METs -- -- -- 1.7 --     Rower   Level -- -- 3 4 --   Watts -- -- 15 10 --   Minutes -- -- 15 15 --   METs -- -- 4.28 4.05 --     Oxygen   Maintain Oxygen Saturation -- 88% or higher 88% or higher 88% or higher 88% or higher    Row Name 01/08/24 1400             Response to Exercise   Blood Pressure (Admit) 134/70       Blood  Pressure (Exit) 130/80       Heart Rate (Admit) 71 bpm       Heart Rate (Exercise) 114 bpm       Heart Rate (Exit) 82 bpm       Rating of Perceived Exertion (Exercise) 12       Symptoms none       Duration Continue  with 30 min of aerobic exercise without signs/symptoms of physical distress.       Intensity THRR unchanged         Progression   Progression Continue to progress workloads to maintain intensity without signs/symptoms of physical distress.       Average METs 2.9         Resistance Training   Training Prescription Yes       Weight 4 lb       Reps 10-15         Interval Training   Interval Training No         Treadmill   MPH 2.1       Grade 0       Minutes 15       METs 2.61         Recumbant Bike   Level 3       Watts 18       Minutes 15       METs 3.09         Oxygen   Maintain Oxygen Saturation 88% or higher          Exercise Comments:   Exercise Comments     Row Name 10/29/23 0954           Exercise Comments First full day of exercise!  Patient was oriented to gym and equipment including functions, settings, policies, and procedures.  Patient's individual exercise prescription and treatment plan were reviewed.  All starting workloads were established based on the results of the 6 minute walk test done at initial orientation visit.  The plan for exercise progression was also introduced and progression will be customized based on patient's performance and goals.          Exercise Goals and Review:   Exercise Goals     Row Name 10/24/23 1128             Exercise Goals   Increase Physical Activity Yes       Intervention Provide advice, education, support and counseling about physical activity/exercise needs.;Develop an individualized exercise prescription for aerobic and resistive training based on initial evaluation findings, risk stratification, comorbidities and participant's personal goals.       Expected Outcomes Short Term: Attend rehab  on a regular basis to increase amount of physical activity.;Long Term: Add in home exercise to make exercise part of routine and to increase amount of physical activity.;Long Term: Exercising regularly at least 3-5 days a week.       Increase Strength and Stamina Yes       Intervention Provide advice, education, support and counseling about physical activity/exercise needs.;Develop an individualized exercise prescription for aerobic and resistive training based on initial evaluation findings, risk stratification, comorbidities and participant's personal goals.       Expected Outcomes Short Term: Increase workloads from initial exercise prescription for resistance, speed, and METs.;Short Term: Perform resistance training exercises routinely during rehab and add in resistance training at home;Long Term: Improve cardiorespiratory fitness, muscular endurance and strength as measured by increased METs and functional capacity ( )       Able to understand and use rate of perceived exertion (RPE) scale Yes       Intervention Provide education and explanation on how to use RPE scale       Expected Outcomes Short Term: Able to use RPE daily in rehab to express subjective intensity level;Long Term:  Able to use RPE to guide intensity level when  exercising independently       Able to understand and use Dyspnea scale Yes       Intervention Provide education and explanation on how to use Dyspnea scale       Expected Outcomes Short Term: Able to use Dyspnea scale daily in rehab to express subjective sense of shortness of breath during exertion;Long Term: Able to use Dyspnea scale to guide intensity level when exercising independently       Knowledge and understanding of Target Heart Rate Range (THRR) Yes       Intervention Provide education and explanation of THRR including how the numbers were predicted and where they are located for reference       Expected Outcomes Short Term: Able to state/look up THRR;Long  Term: Able to use THRR to govern intensity when exercising independently;Short Term: Able to use daily as guideline for intensity in rehab       Able to check pulse independently Yes       Intervention Provide education and demonstration on how to check pulse in carotid and radial arteries.;Review the importance of being able to check your own pulse for safety during independent exercise       Expected Outcomes Short Term: Able to explain why pulse checking is important during independent exercise;Long Term: Able to check pulse independently and accurately       Understanding of Exercise Prescription Yes       Intervention Provide education, explanation, and written materials on patient's individual exercise prescription       Expected Outcomes Short Term: Able to explain program exercise prescription;Long Term: Able to explain home exercise prescription to exercise independently          Exercise Goals Re-Evaluation :  Exercise Goals Re-Evaluation     Row Name 10/29/23 0955 11/13/23 1437 11/29/23 0806 12/12/23 0831 12/27/23 1605     Exercise Goal Re-Evaluation   Exercise Goals Review Able to understand and use rate of perceived exertion (RPE) scale;Able to understand and use Dyspnea scale;Knowledge and understanding of Target Heart Rate Range (THRR) Increase Physical Activity;Increase Strength and Stamina;Understanding of Exercise Prescription Increase Physical Activity;Increase Strength and Stamina;Understanding of Exercise Prescription Increase Physical Activity;Increase Strength and Stamina;Understanding of Exercise Prescription Increase Physical Activity;Increase Strength and Stamina;Understanding of Exercise Prescription   Comments Reviewed RPE and dyspnea scale, THR and program prescription with pt today.  Pt voiced understanding and was given a copy of goals to take home. [name] is off to a good start in the program. She was able to attend her first four sessions during this review. During her  first sessions she was able to use the treadmill at a speed of 2. and 1% incline, and the T6 nustep at level 2. We will continue to monitor her progress in the program. [name] has only attended two sessions since the last review. She has continued to walk the treadmill at a speed of 2.2 mph with an incline of 1%. She also began using the rower at level 3. We will continue to monitor her progress in the program. [name] is doing well in rehab. She was recently able to increase from level 3 to 4 on the rowing machine. She was also able to maintain level 1 on the XR. We will continue to monitor her progress in the program. [name] is doing well in rehab. Sheincreased her workload on the treadmill to a speed of 2.2 mph with no incline. She also increased to level 2 on the XR. She  added the elliptical at level 1 and tolerated it well. We will continue to monitor her progress in the program.   Expected Outcomes Short: Use RPE daily to regulate intensity. Long: Follow program prescription in THR. Short: Continue to follow exercise prescription. Long: Continue exercise to imporve strength and stamina. Short: Attend rehab more regularly. Long: Continue exercise to imporve strength and stamina. Short: increase treadmill workload. Long: Continue exercise to improve strength and stamina. Short: Continue to progressively increase treadmill and XR workloads. Long: Continue exercise to improve strength and stamina.    Row Name 01/08/24 1425             Exercise Goal Re-Evaluation   Exercise Goals Review Increase Physical Activity;Increase Strength and Stamina;Understanding of Exercise Prescription       Comments [name] has only attended one session since the last review. She was able to continue to walk the treadmill at a speed of 2.1 mph with no incline. She also began using the recumbent bike and did well at level 3. We will continue to monitor her progress in the program.       Expected Outcomes Short: Attend rehab more  consistently. Long: Continue exercise to improve strength and stamina.          Discharge Exercise Prescription (Final Exercise Prescription Changes):  Exercise Prescription Changes - 01/08/24 1400       Response to Exercise   Blood Pressure (Admit) 134/70    Blood Pressure (Exit) 130/80    Heart Rate (Admit) 71 bpm    Heart Rate (Exercise) 114 bpm    Heart Rate (Exit) 82 bpm    Rating of Perceived Exertion (Exercise) 12    Symptoms none    Duration Continue with 30 min of aerobic exercise without signs/symptoms of physical distress.    Intensity THRR unchanged      Progression   Progression Continue to progress workloads to maintain intensity without signs/symptoms of physical distress.    Average METs 2.9      Resistance Training   Training Prescription Yes    Weight 4 lb    Reps 10-15      Interval Training   Interval Training No      Treadmill   MPH 2.1    Grade 0    Minutes 15    METs 2.61      Recumbant Bike   Level 3    Watts 18    Minutes 15    METs 3.09      Oxygen   Maintain Oxygen Saturation 88% or higher          Nutrition:  Target Goals: Understanding of nutrition guidelines, daily intake of sodium 1500mg , cholesterol 200mg , calories 30% from fat and 7% or less from saturated fats, daily to have 5 or more servings of fruits and vegetables.  Education: All About Nutrition: -Group instruction provided by verbal, written material, interactive activities, discussions, models, and posters to present general guidelines for heart healthy nutrition including fat, fiber, MyPlate, the role of sodium in heart healthy nutrition, utilization of the nutrition label, and utilization of this knowledge for meal planning. Follow up email sent as well. Written material given at graduation.   Biometrics:  Pre Biometrics - 10/24/23 1130       Pre Biometrics   Height 5' 4.5 (1.638 m)    Weight 154 lb 14.4 oz (70.3 kg)    Waist Circumference 36 inches    Hip  Circumference 39 inches  Waist to Hip Ratio 0.92 %    BMI (Calculated) 26.19    Single Leg Stand 4.9 seconds           Nutrition Therapy Plan and Nutrition Goals:  Nutrition Therapy & Goals - 10/24/23 1513       Nutrition Therapy   Diet Carb controlled, Cardiac, Low Na    Protein (specify units) 70-90g    Fiber 25 grams    Whole Grain Foods 3 servings    Saturated Fats 15 max. grams    Fruits and Vegetables 5 servings/day    Sodium 2 grams      Personal Nutrition Goals   Nutrition Goal Read labels and reduce sodium intake to below 2300mg . Ideally 1500mg  per day.    Personal Goal #2 Eat 15-30gProtein and 30-60gCarbs at each meal.    Personal Goal #3 Reduce saturated fat, less than 12g per day. Replace bad fats for more heart healthy fats.    Comments Patient drinking ~50-75oz of water. She eats 3 meals per day, sometimes will have fruit as a snack between meals. She has DM and knows to monitor her carb intake. Reviewed goal of ~30-60g carbs per meal. Encouraged her to pair her carbs with protein. She has been watching her salt intake trying to stay below 2300mg . Provided her a guideline of less than 1500mg  per day. Reviewed mediterranean diet handout. Educated on types of fats, sources, and how to read labels. Reviewed several facts labels together. She is knowledgeable of foods and macros. She knows what to do and has followed a heart healthy diet years ago. She wants to set goal to be more strict on sodium and saturated fats, and stay consistent with her healthy eating.      Intervention Plan   Intervention Prescribe, educate and counsel regarding individualized specific dietary modifications aiming towards targeted core components such as weight, hypertension, lipid management, diabetes, heart failure and other comorbidities.;Nutrition handout(s) given to patient.    Expected Outcomes Short Term Goal: Understand basic principles of dietary content, such as calories, fat, sodium,  cholesterol and nutrients.;Short Term Goal: A plan has been developed with personal nutrition goals set during dietitian appointment.;Long Term Goal: Adherence to prescribed nutrition plan.          Nutrition Assessments:  MEDIFICTS Score Key: >=70 Need to make dietary changes  40-70 Heart Healthy Diet <= 40 Therapeutic Level Cholesterol Diet   Picture Your Plate Scores: <59 Unhealthy dietary pattern with much room for improvement. 41-50 Dietary pattern unlikely to meet recommendations for good health and room for improvement. 51-60 More healthful dietary pattern, with some room for improvement.  >60 Healthy dietary pattern, although there may be some specific behaviors that could be improved.    Nutrition Goals Re-Evaluation:  Nutrition Goals Re-Evaluation     Row Name 11/26/23 0943             Goals   Current Weight 157 lb 3.2 oz (71.3 kg)       Comment [name] is complying with RD nutrition goals. She is still trying to lose some weight and says her target weight it 145lb.       Expected Outcome Short: Continue to diet and follow RD guidelines. Long: Continue to attend rehab and manage diet.          Nutrition Goals Discharge (Final Nutrition Goals Re-Evaluation):  Nutrition Goals Re-Evaluation - 11/26/23 0943       Goals   Current Weight 157 lb 3.2 oz (  71.3 kg)    Comment [name] is complying with RD nutrition goals. She is still trying to lose some weight and says her target weight it 145lb.    Expected Outcome Short: Continue to diet and follow RD guidelines. Long: Continue to attend rehab and manage diet.          Psychosocial: Target Goals: Acknowledge presence or absence of significant depression and/or stress, maximize coping skills, provide positive support system. Participant is able to verbalize types and ability to use techniques and skills needed for reducing stress and depression.   Education: Stress, Anxiety, and Depression - Group verbal and visual  presentation to define topics covered.  Reviews how body is impacted by stress, anxiety, and depression.  Also discusses healthy ways to reduce stress and to treat/manage anxiety and depression.  Written material given at graduation.   Education: Sleep Hygiene -Provides group verbal and written instruction about how sleep can affect your health.  Define sleep hygiene, discuss sleep cycles and impact of sleep habits. Review good sleep hygiene tips.    Initial Review & Psychosocial Screening:  Initial Psych Review & Screening - 10/23/23 1310       Initial Review   Current issues with Current Stress Concerns      Family Dynamics   Good Support System? Yes      Barriers   Psychosocial barriers to participate in program There are no identifiable barriers or psychosocial needs.;The patient should benefit from training in stress management and relaxation.      Screening Interventions   Interventions Encouraged to exercise;Provide feedback about the scores to participant;To provide support and resources with identified psychosocial needs    Expected Outcomes Short Term goal: Utilizing psychosocial counselor, staff and physician to assist with identification of specific Stressors or current issues interfering with healing process. Setting desired goal for each stressor or current issue identified.;Long Term Goal: Stressors or current issues are controlled or eliminated.;Short Term goal: Identification and review with participant of any Quality of Life or Depression concerns found by scoring the questionnaire.;Long Term goal: The participant improves quality of Life and PHQ9 Scores as seen by post scores and/or verbalization of changes          Quality of Life Scores:   Scores of 19 and below usually indicate a poorer quality of life in these areas.  A difference of  2-3 points is a clinically meaningful difference.  A difference of 2-3 points in the total score of the Quality of Life Index has  been associated with significant improvement in overall quality of life, self-image, physical symptoms, and general health in studies assessing change in quality of life.  PHQ-9: Review Flowsheet  More data exists      11/14/2023 10/24/2023 12/06/2021 10/05/2021 01/28/2020  Depression screen PHQ 2/9  Decreased Interest 0 0 0 0 0  Down, Depressed, Hopeless 0 0 2 0 0  PHQ - 2 Score 0 0 2 0 0  Altered sleeping - 0 1 - -  Tired, decreased energy - 0 0 - -  Change in appetite - 0 0 - -  Feeling bad or failure about yourself  - 0 0 - -  Trouble concentrating - 0 0 - -  Moving slowly or fidgety/restless - 0 0 - -  Suicidal thoughts - 0 0 - -  PHQ-9 Score - 0 3 - -  Difficult doing work/chores - - Somewhat difficult - -   Interpretation of Total Score  Total Score Depression  Severity:  1-4 = Minimal depression, 5-9 = Mild depression, 10-14 = Moderate depression, 15-19 = Moderately severe depression, 20-27 = Severe depression   Psychosocial Evaluation and Intervention:  Psychosocial Evaluation - 10/23/23 1321       Psychosocial Evaluation & Interventions   Comments [name] is coming to cardiac rehab after a stent. 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 She also is being worked up for pulmonary issues and is scheduled for a CT and PFTs to help provide reasons as to why she feels short of breath. She states she feels like she is handling all her medical issues well, but admits it has been a lot on her. She focuses on one day at a time and being thankful for what she has. She has a great support system. She suffers with insomnia and has for years now, so getting good rest if difficult for her.    Expected Outcomes Short: attend cardiac rehab for education and exercise Long: develop and maintain positive self care habits    Continue Psychosocial Services  Follow up required by staff          Psychosocial  Re-Evaluation:  Psychosocial Re-Evaluation     Row Name 11/26/23 0940             Psychosocial Re-Evaluation   Current issues with Current Stress Concerns       Comments [name] states that she isnt dealing with any stressors at this time. She does state that she deals insomnia, she was prescribed medication and that seems to be helping.       Expected Outcomes Short: Continue to comply with insomnia medication. Long: Continue to manage stress and insomnia.       Interventions Encouraged to attend Cardiac Rehabilitation for the exercise       Continue Psychosocial Services  Follow up required by staff          Psychosocial Discharge (Final Psychosocial Re-Evaluation):  Psychosocial Re-Evaluation - 11/26/23 0940       Psychosocial Re-Evaluation   Current issues with Current Stress Concerns    Comments [name] states that she isnt dealing with any stressors at this time. She does state that she deals insomnia, she was prescribed medication and that seems to be helping.    Expected Outcomes Short: Continue to comply with insomnia medication. Long: Continue to manage stress and insomnia.    Interventions Encouraged to attend Cardiac Rehabilitation for the exercise    Continue Psychosocial Services  Follow up required by staff          Vocational Rehabilitation: Provide vocational rehab assistance to qualifying candidates.   Vocational Rehab Evaluation & Intervention:  Vocational Rehab - 10/23/23 1310       Initial Vocational Rehab Evaluation & Intervention   Assessment shows need for Vocational Rehabilitation No          Education: Education Goals: Education classes will be provided on a variety of topics geared toward better understanding of heart health and risk factor modification. Participant will state understanding/return demonstration of topics presented as noted by education test scores.  Learning Barriers/Preferences:  Learning Barriers/Preferences - 10/23/23 1310        Learning Barriers/Preferences   Learning Barriers None    Learning Preferences None          General Cardiac Education Topics:  AED/CPR: - Group verbal and written instruction with the use of models  to demonstrate the basic use of the AED with the basic ABC's of resuscitation.   Anatomy and Cardiac Procedures: - Group verbal and visual presentation and models provide information about basic cardiac anatomy and function. Reviews the testing methods done to diagnose heart disease and the outcomes of the test results. Describes the treatment choices: Medical Management, Angioplasty, or Coronary Bypass Surgery for treating various heart conditions including Myocardial Infarction, Angina, Valve Disease, and Cardiac Arrhythmias.  Written material given at graduation.   Medication Safety: - Group verbal and visual instruction to review commonly prescribed medications for heart and lung disease. Reviews the medication, class of the drug, and side effects. Includes the steps to properly store meds and maintain the prescription regimen.  Written material given at graduation.   Intimacy: - Group verbal instruction through game format to discuss how heart and lung disease can affect sexual intimacy. Written material given at graduation..   Know Your Numbers and Heart Failure: - Group verbal and visual instruction to discuss disease risk factors for cardiac and pulmonary disease and treatment options.  Reviews associated critical values for Overweight/Obesity, Hypertension, Cholesterol, and Diabetes.  Discusses basics of heart failure: signs/symptoms and treatments.  Introduces Heart Failure Zone chart for action plan for heart failure.  Written material given at graduation.   Infection Prevention: - Provides verbal and written material to individual with discussion of infection control including proper hand washing and proper equipment cleaning during exercise session. Flowsheet Row  Cardiac Rehab from 10/24/2023 in [facility] Cardiac and Pulmonary Rehab  Date 10/24/23  Educator NT  Instruction Review Code 1- Verbalizes Understanding    Falls Prevention: - Provides verbal and written material to individual with discussion of falls prevention and safety. Flowsheet Row Cardiac Rehab from 10/24/2023 in [facility] Cardiac and Pulmonary Rehab  Date 10/24/23  Educator NT  Instruction Review Code 1- Verbalizes Understanding    Other: -Provides group and verbal instruction on various topics (see comments)   Knowledge Questionnaire Score:   Core Components/Risk Factors/Patient Goals at Admission:  Personal Goals and Risk Factors at Admission - 10/23/23 1305       Core Components/Risk Factors/Patient Goals on Admission    Weight Management Yes;Weight Loss    Intervention Weight Management: Develop a combined nutrition and exercise program designed to reach desired caloric intake, while maintaining appropriate intake of nutrient and fiber, sodium and fats, and appropriate energy expenditure required for the weight goal.;Weight Management: Provide education and appropriate resources to help participant work on and attain dietary goals.;Weight Management/Obesity: Establish reasonable short term and long term weight goals.    Goal Weight: Long Term 140 lb (63.5 kg)    Expected Outcomes Short Term: Continue to assess and modify interventions until short term weight is achieved;Long Term: Adherence to nutrition and physical activity/exercise program aimed toward attainment of established weight goal;Weight Loss: Understanding of general recommendations for a balanced deficit meal plan, which promotes 1-2 lb weight loss per week and includes a negative energy balance of [phone] kcal/d;Understanding recommendations for meals to include 15-35% energy as protein, 25-35% energy from fat, 35-60% energy from carbohydrates, less than 200mg  of dietary cholesterol, 20-35 gm of total fiber  daily;Understanding of distribution of calorie intake throughout the day with the consumption of 4-5 meals/snacks    Diabetes Yes    Intervention Provide education about signs/symptoms and action to take for hypo/hyperglycemia.;Provide education about proper nutrition, including hydration, and aerobic/resistive exercise prescription along with prescribed medications to achieve blood glucose in normal ranges: Fasting  glucose 65-99 mg/dL    Expected Outcomes Short Term: Participant verbalizes understanding of the signs/symptoms and immediate care of hyper/hypoglycemia, proper foot care and importance of medication, aerobic/resistive exercise and nutrition plan for blood glucose control.;Long Term: Attainment of HbA1C < 7%.    Lipids Yes    Intervention Provide education and support for participant on nutrition & aerobic/resistive exercise along with prescribed medications to achieve LDL 70mg , HDL >40mg .    Expected Outcomes Short Term: Participant states understanding of desired cholesterol values and is compliant with medications prescribed. Participant is following exercise prescription and nutrition guidelines.;Long Term: Cholesterol controlled with medications as prescribed, with individualized exercise RX and with personalized nutrition plan. Value goals: LDL < 70mg , HDL > 40 mg.          Education:Diabetes - Individual verbal and written instruction to review signs/symptoms of diabetes, desired ranges of glucose level fasting, after meals and with exercise. Acknowledge that pre and post exercise glucose checks will be done for 3 sessions at entry of program. Flowsheet Row Cardiac Rehab from 10/24/2023 in [facility] Cardiac and Pulmonary Rehab  Date 10/23/23  Educator [facility]  Instruction Review Code 1- Verbalizes Understanding    Core Components/Risk Factors/Patient Goals Review:   Goals and Risk Factor Review     Row Name 11/26/23 0947             Core Components/Risk Factors/Patient Goals  Review   Personal Goals Review Weight Management/Obesity;Diabetes       Review [name] is still trying to lose about 10 pounds, she continues to diet and attend rehab in order to achieve her goals. She continues to check her blood sugar, but admits not everyday. Talked with patient about the importance of checking blood sugar everyday.       Expected Outcomes Short: Start checking blood sugar everyday. Long: Continue to exercise and manage diet in order to lose weight and keep blood sugar levels within a healthy range.          Core Components/Risk Factors/Patient Goals at Discharge (Final Review):   Goals and Risk Factor Review - 11/26/23 0947       Core Components/Risk Factors/Patient Goals Review   Personal Goals Review Weight Management/Obesity;Diabetes    Review [name] is still trying to lose about 10 pounds, she continues to diet and attend rehab in order to achieve her goals. She continues to check her blood sugar, but admits not everyday. Talked with patient about the importance of checking blood sugar everyday.    Expected Outcomes Short: Start checking blood sugar everyday. Long: Continue to exercise and manage diet in order to lose weight and keep blood sugar levels within a healthy range.          ITP Comments:  ITP Comments     Row Name 10/23/23 1302 10/24/23 1126 10/29/23 0954 11/21/23 0934 12/19/23 0814   ITP Comments Initial phone call completed. Diagnosis can be found in [facility] 4/8. EP Orientation scheduled for Wednesday 5/7 9:30. Completed and gym orientation for cardiac rehab. Initial ITP created and sent for review to Dr. Oneil Pinal, Medical Director. First full day of exercise!  Patient was oriented to gym and equipment including functions, settings, policies, and procedures.  Patient's individual exercise prescription and treatment plan were reviewed.  All starting workloads were established based on the results of the 6 minute walk test done at initial orientation visit.   The plan for exercise progression was also introduced and progression will be customized based on  patient's performance and goals. 30 Day review completed. Medical Director ITP review done, changes made as directed, and signed approval by Medical Director.    new to program 30 Day review completed. Medical Director ITP review done, changes made as directed, and signed approval by Medical Director.    Row Name 01/16/24 0758           ITP Comments 30 Day review completed. Medical Director ITP review done, changes made as directed, and signed approval by Medical Director.          Comments: 30 day review

## 2024-01-17 ENCOUNTER — Encounter: Payer: Self-pay | Admitting: Pulmonary Disease

## 2024-01-17 ENCOUNTER — Ambulatory Visit: Admitting: Pulmonary Disease

## 2024-01-17 VITALS — BP 128/74 | HR 75 | Temp 98.1°F | Ht 64.0 in | Wt 158.0 lb

## 2024-01-17 DIAGNOSIS — R911 Solitary pulmonary nodule: Secondary | ICD-10-CM

## 2024-01-17 NOTE — Patient Instructions (Signed)
 Your CT and PFTs are normal which is good.  There is a small lung nodule which we will follow-up with scan in 1 year Return to clinic in 1 year

## 2024-01-17 NOTE — Progress Notes (Signed)
 Brandi Mahoney    [id number]    1956/05/24  Primary Care Physician:Copland, Jacques, MD  Referring Physician: Pualani Borah, MD [address] [place],  [place] [address]  Chief complaint: Follow-up for dyspnea, lung nodule  HPI: 68 y.o. who  has a past medical history of CAD (coronary artery disease), GAD (generalized anxiety disorder), History of kidney stones, HLD (hyperlipidemia), Hypothyroidism, Major depressive disorder, recurrent episode, in full remission (HCC) (09/08/2008), Migraine headache, Myocarditis (HCC), Sleep apnea, and Type 2 diabetes, diet controlled (HCC).  Discussed the use of AI scribe software for clinical note transcription with the patient, who gave verbal consent to proceed.  History of Present Illness Brandi Mahoney is a 68 year old female with unstable angina and stent placement who presents with chronic shortness of breath and a persistent dry cough. She was referred by Dr. Ubaldo for evaluation of her chronic shortness of breath and persistent dry cough.  She has experienced chronic shortness of breath for many years, which worsens with physical activity. The shortness of breath has remained stable over time and did not significantly improve following her recent stent placement. She also has a persistent dry cough that has lasted over a year.   She underwent hospitalization in March 2025 for angina and a heart catheterization and stent placement after experiencing chest pain and was found to have an 85% blockage in one of her arteries. A CT scan was performed at that time to rule out blood clots, which showed no clots but indicated mild changes at the base of her lungs, possibly scarring or atelectasis.  Her past medical history includes viral myocarditis in 2011, which resolved without lasting effects. She had pneumonia as a child and has a functional heart murmur since childhood.  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  No feather pillows or comforter Smoking history: 12-pack-year smoker.  Quit around 2005 Travel history: Originally from [place] .  Moved to [place]  around 2010.  No significant recent travel Relevant family history: No family history of lung disease   Outpatient Encounter Medications as of 01/17/2024  Medication Sig   albuterol  (VENTOLIN  HFA) 108 (90 Base) MCG/ACT inhaler Inhale 2 puffs into the lungs every 6 (six) hours as needed for wheezing or shortness of breath.   amLODipine  (NORVASC ) 2.5 MG tablet Take 1 tablet (2.5 mg total) by mouth daily.   aspirin  EC 81 MG tablet Take 81 mg by mouth daily.   clobetasol  ointment (TEMOVATE ) 0.05 % Apply 1 Application topically daily as needed (skin conditions).   fenofibrate  160 MG tablet Take 1 tablet (160 mg total) by mouth daily.   fluticasone (FLONASE) 50 MCG/ACT nasal spray Place 2 sprays into both nostrils in the morning and at bedtime.   glucose blood (ACCU-CHEK GUIDE) test strip Use to check blood sugar daily   Lactobacillus-Inulin (CULTURELLE ADULT ULT BALANCE PO) Take 1 tablet by mouth daily.   levothyroxine  (SYNTHROID ) 75 MCG tablet TAKE ONE TABLET BY MOUTH EVERY MORNING BEFORE BREAKFAST   Magnesium  250 MG TABS Take 250 mg by mouth daily.   metFORMIN  (GLUCOPHAGE -XR) 500 MG 24 hr tablet Take 1,000 mg by mouth 2 (two) times daily with a meal.   Multiple Vitamins-Minerals (ONE-A-DAY WOMENS  PETITES PO) Take 1 tablet by mouth daily.   nitroGLYCERIN  (NITROSTAT ) 0.4 MG SL tablet Place 1 tablet under tongue at onset of chest pain; you may repeat every 5 minutes for up to 3 doses.   pantoprazole  (PROTONIX ) 40 MG tablet Take 1 tablet (40 mg total) by mouth daily.   prasugrel  (EFFIENT ) 10 MG TABS tablet Take 1 tablet (10 mg total) by  mouth daily.   rosuvastatin  (CRESTOR ) 40 MG tablet Take 1 tablet (40 mg total) by mouth daily.   sertraline  (ZOLOFT ) 50 MG tablet TAKE 1 TABLET BY MOUTH DAILY   traZODone  (DESYREL ) 100 MG tablet TAKE 1 TABLET BY MOUTH AT BEDTIME   Accu-Chek Softclix Lancets lancets Use to check blood sugar daily   gabapentin  (NEURONTIN ) 300 MG capsule Take 300 mg by mouth at bedtime. (Patient not taking: Reported on 01/17/2024)   metoprolol  succinate (TOPROL -XL) 50 MG 24 hr tablet Take 1 tablet (50 mg total) by mouth daily. Take with or immediately following a meal. (Patient not taking: Reported on 12/28/2023)   traMADol (ULTRAM) 50 MG tablet Take 50 mg by mouth 2 (two) times daily as needed. (Patient not taking: Reported on 01/17/2024)   No facility-administered encounter medications on file as of 01/17/2024.   Physical Exam: Blood pressure 128/74, pulse 75, temperature 98.1 F (36.7 C), temperature source Oral, height 5' 4 (1.626 m), weight 158 lb (71.7 kg), SpO2 95%. Gen:      No acute distress HEENT:  EOMI, sclera anicteric Neck:     No masses; no thyromegaly Lungs:    Clear to auscultation bilaterally; normal respiratory effort CV:         Regular rate and rhythm; no murmurs Abd:      + bowel sounds; soft, non-tender; no palpable masses, no distension Ext:    No edema; adequate peripheral perfusion Neuro: alert and oriented x 3 Psych: normal mood and affect   Data Reviewed: Imaging: CTA 09/17/2023 No pulmonary embolism, mild bibasilar opacities representing linear scarring or atelectasis  CT high-resolution 11/14/2023-no interstitial lung disease, mild air trapping, 4 mm lingular nodule I had reviewed the images personally  PFTs: 12/13/2023 FVC 2.87 [92%], FEV1 2.35 [9 9%], F/F52, DLCO 18.83 [95%] Normal pulmonary function test  Labs: Assessment & Plan Dyspnea, lung nodule Likely due to body habitus CT scan does not show any interstitial lung disease.  There is a 4 mm nodule that we will  follow in a year due to her smoking history PFTs are normal. Reassured the patient today in office.  Smoking cessation Social smoking ceased approximately 20 years ago. No current smoking-related symptoms or exposures.  Recommendations: CT in 1 year  Lonna Coder MD [facility] 01/17/2024, 1:57 PM  CC: Markon Jares, MD

## 2024-01-18 ENCOUNTER — Encounter: Attending: Cardiovascular Disease

## 2024-01-18 DIAGNOSIS — Z955 Presence of coronary angioplasty implant and graft: Secondary | ICD-10-CM | POA: Insufficient documentation

## 2024-01-18 NOTE — Progress Notes (Signed)
 Daily Session Note  Patient Details  Name: Brandi Mahoney MRN: [id number] Date of Birth: October 14, 1955 Referring Provider:   Flowsheet Row Cardiac Rehab from 10/24/2023 in [facility] Cardiac and Pulmonary Rehab  Referring Provider Dr. Evalene Lunger, MD    Encounter Date: 01/18/2024  Check In:  Session Check In - 01/18/24 0930       Check-In   Supervising physician immediately available to respond to emergencies See telemetry face sheet for immediately available ER MD    Staff Present Josette Shallow RN,BC,MSN;Noah Tickle, BS, Exercise Physiologist;Maxon Conetta BS, Exercise Physiologist;Joseph Rolinda RCP,RRT,BSRT    Virtual Visit No    Medication changes reported     No    Fall or balance concerns reported    No    Warm-up and Cool-down Performed on first and last piece of equipment    Resistance Training Performed Yes    VAD Patient? No    PAD/SET Patient? No      Pain Assessment   Currently in Pain? No/denies    Multiple Pain Sites No             Social History   Tobacco Use  Smoking Status Former   Current packs/day: 0.00   Average packs/day: 0.4 packs/day for 35.0 years (12.3 ttl pk-yrs)   Types: Cigarettes   Start date: 09/25/1973   Quit date: 09/25/2008   Years since quitting: 15.3  Smokeless Tobacco Never  Tobacco Comments   quit in 2007-2008, social smoker    Goals Met:  Independence with exercise equipment Exercise tolerated well No report of concerns or symptoms today Strength training completed today  Goals Unmet:  Not Applicable  Comments: Pt able to follow exercise prescription today without complaint.  Will continue to monitor for progression.    Dr. Oneil Pinal is Medical Director for [facility] Cardiac Rehabilitation.  Dr. Fuad Aleskerov is Medical Director for [facility].

## 2024-01-21 ENCOUNTER — Encounter

## 2024-01-21 DIAGNOSIS — K117 Disturbances of salivary secretion: Secondary | ICD-10-CM | POA: Diagnosis not present

## 2024-01-23 ENCOUNTER — Encounter

## 2024-01-27 ENCOUNTER — Other Ambulatory Visit: Payer: Self-pay | Admitting: Family Medicine

## 2024-01-28 ENCOUNTER — Other Ambulatory Visit (HOSPITAL_COMMUNITY): Payer: Self-pay

## 2024-01-28 ENCOUNTER — Other Ambulatory Visit: Payer: Self-pay

## 2024-01-28 NOTE — Telephone Encounter (Signed)
 Please schedule CPE with fasting labs prior with Dr. Watt after 03/31/24 [facility] visit.

## 2024-01-28 NOTE — Telephone Encounter (Signed)
 Spoke to pt, sch cpe for 04/02/24

## 2024-02-04 NOTE — Progress Notes (Deleted)
 Cardiology Office Note    Date:  02/04/2024   ID:  YENTY BLOCH, DOB [birth year], MRN [id number]  PCP:  Watt Mirza, MD  Cardiologist:  Evalene Lunger, MD  Electrophysiologist:  None   Chief Complaint: Follow-up  History of Present Illness:   AMEERA TIGUE is a 68 y.o. 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   She was admitted in 2011 with chest pain and elevated troponin.  Cardiac cath showed mild CAD.  Presentation was felt to be secondary to viral myocarditis.  Echo in 09/2017 showed an EF of 65 to 70% with no significant valvular disease.  Nuclear stress test in 10/2019 was without ischemia.     She was seen in the ED in 08/2023 with chest pain.  Troponin negative.  EKG showed sinus rhythm with a new left bundle branch block.  CTA chest was negative for PE or aortic dissection.  She followed up with Dr. Verlin in 09/2023 noting progressive exertional chest pressure and dyspnea, concerning for unstable angina.  In this setting she underwent LHC on 09/25/2023 that showed 85% stenosis in the proximal LAD which was treated successfully with PCI/DES.  In the setting of arteriotomy site discomfort she underwent ultrasound of the right upper extremity which showed a pseudoaneurysm measuring 3 x 1 cm that was not amenable to thrombin injection.  She underwent ultrasound-guided compression without resolution or thrombosis.  She was evaluated by vascular surgery and underwent ulnar pseudoaneurysm repair without complications.  Echo in 10/2023 showed an EF of 55 to 60%, no regional wall motion abnormalities, grade 1 diastolic dysfunction, normal RV systolic function, ventricular cavity size, and RVSP, moderate mitral valve regurgitation, and an estimated right atrial pressure of 8 mmHg.  She followed up with  vascular surgery with repeat right upper extremity arterial duplex showing no obstruction visualized in the right upper extremity with widely patent right ulnar artery with no further intervention indicated.  In the setting of longstanding history of dyspnea and dry cough, she underwent high-resolution CT through pulmonology in 10/2023 that was without evidence of ILD and findings consistent of small airway disease.   She was seen in the ED on 12/11/2023 with intermittent twinges of chest pain and a stronger sensation when taking a deep breath.  EKG showed sinus rhythm with known LBBB.  High-sensitivity troponin negative x 2.  D-dimer negative.    She was last seen in the office on 12/28/2023 and was without symptoms of angina or cardiac decompensation.  She reported 3 episodes of randomly occurring sharp chest discomfort that would last for 4 to 5 seconds and spontaneously resolve.  We underwent a trial of amlodipine  2.5 mg daily with continuation of DAPT and secondary prevention.  ***   Labs independently reviewed: 11/2023 - Hgb 11.9, PLT 325, potassium 3.7, BUN 16, serum creatinine 0.61, direct LDL 19, TC 80, TG 130, HDL 36, albumin  4.6, AST/ALT normal 07/2023 - A1c 6.4 11/2022 - TSH normal  Past Medical History:  Diagnosis Date   CAD (coronary artery disease)    a. 01/2010 Cath: mild atherosclerosis in the mid LCX after takeoff of OM1, mild diff LAD dzs;  b. 07/2013 MV: EF 67%, no ischemia.   GAD (generalized anxiety disorder)    History of kidney stones    HLD (hyperlipidemia)    Hypothyroidism  Major depressive disorder, recurrent episode, in full remission (HCC) 09/08/2008   Qualifier: Diagnosis of  By: Watt MD, Spencer     Migraine headache    Myocarditis (HCC)    a. 01/2010 presented w/ c/p and elev trop-->Cath: mild atherosclerosis in the mid LCX after takeoff of OM1, mild diff LAD dzs;  b. 03/2010 Echo: EF 60-65%, DD, mild LVH;  c. 07/2011 Echo: EF 55-60%, mild LVH, no rwma.   Sleep  apnea    has c-pap.  wears off and on - not lately  using  per reported 07-29-2020   Type 2 diabetes, diet controlled [facility])     Past Surgical History:  Procedure Laterality Date   APPENDECTOMY  1974   BREAST SURGERY Right 1988   fatty tumore from breast   CARDIAC CATHETERIZATION  01/2010   no significant -[facility] Dr Perla,   CHOLECYSTECTOMY  2002   COLONOSCOPY  2017   CORONARY STENT INTERVENTION N/A 09/25/2023   Procedure: CORONARY STENT INTERVENTION;  Surgeon: Swaziland, Peter M, MD;  Location: [facility] INVASIVE CV LAB;  Service: Cardiovascular;  Laterality: N/A;   CYST EXCISION     ovarian cyst 1994   INSERTION OF ARTERIOVENOUS (AV) ARTEGRAFT ARM Right 09/27/2023   Procedure: 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;  Surgeon: Pearline Norman RAMAN, MD;  Location: [facility] OR;  Service: Vascular;  Laterality: Right;   LEFT HEART CATH AND CORONARY ANGIOGRAPHY N/A 09/25/2023   Procedure: LEFT HEART CATH AND CORONARY ANGIOGRAPHY;  Surgeon: Swaziland, Peter M, MD;  Location: [facility] INVASIVE CV LAB;  Service: Cardiovascular;  Laterality: N/A;   NEPHROLITHOTOMY Right 08/20/2014   Procedure: RIGHT PERCUTANEOUS NEPHROLITHOTOMY ;  Surgeon: Norleen JINNY Seltzer, MD;  Location: [facility] ORS;  Service: Urology;  Laterality: Right;   POLYPECTOMY     RECTAL EXAM UNDER ANESTHESIA N/A 08/02/2020   Procedure: DONNY EXAM UNDER ANESTHESIA INJECTION OF PERIANAL BOTOX ;  Surgeon: Teresa Lonni HERO, MD;  Location: [facility];  Service: General;  Laterality: N/A;   TOTAL ABDOMINAL HYSTERECTOMY W/ BILATERAL SALPINGOOPHORECTOMY  2000   no ovaries, uterus, or cervix.    Current Medications: No outpatient medications have been marked as taking for the 02/07/24 encounter (Appointment) with Abigail Bernardino HERO, PA-C.    Allergies:   Cat dander, Pseudoeph-doxylamine-dm-apap, Gluten, and Penicillins   Social History   Socioeconomic History   Marital status: Married    Spouse name: Koren   Number of children: 3   Years of  education: Not on file   Highest education level: Not on file  Occupational History   Occupation: retired    Comment: [employer]  Tobacco Use   Smoking status: Former    Current packs/day: 0.00    Average packs/day: 0.4 packs/day for 35.0 years (12.3 ttl pk-yrs)    Types: Cigarettes    Start date: 09/25/1973    Quit date: 09/25/2008    Years since quitting: 15.3   Smokeless tobacco: Never   Tobacco comments:    quit in 2007-2008, social smoker  Vaping Use   Vaping status: Never Used  Substance and Sexual Activity   Alcohol use: Not Currently   Drug use: No   Sexual activity: Not Currently  Other Topics Concern   Not on file  Social History Narrative   Lives in [place] w/ husband.  No reg exercise.   Social Drivers of Corporate investment banker Strain: Not on file  Food Insecurity: No Food Insecurity (09/26/2023)   Hunger Vital Sign  Worried About Programme researcher, broadcasting/film/video in the Last Year: Never true    Ran Out of Food in the Last Year: Never true  Transportation Needs: No Transportation Needs (09/26/2023)   PRAPARE - Administrator, Civil Service (Medical): No    Lack of Transportation (Non-Medical): No  Physical Activity: Not on file  Stress: Not on file  Social Connections: Socially Integrated (09/26/2023)   Social Connection and Isolation Panel    Frequency of Communication with Friends and Family: More than three times a week    Frequency of Social Gatherings with Friends and Family: Once a week    Attends Religious Services: More than 4 times per year    Active Member of [employer] or Organizations: Yes    Attends Engineer, structural: More than 4 times per year    Marital Status: Married     Family History:  The patient's family history includes ALS in her mother; Breast cancer in her paternal grandmother; Breast cancer (age of onset: 60) in her sister; Colon cancer in her maternal grandfather; Colon cancer (age of onset: 48) in her son; Coronary artery  disease in her mother; Diabetes in her mother; Down syndrome in her son; Hyperlipidemia in her father; Lung cancer in her paternal grandfather; Other in her daughter; Stomach cancer in her maternal grandfather. There is no history of Esophageal cancer, Liver cancer, Pancreatic cancer, or Rectal cancer.  ROS:   12-point review of systems is negative unless otherwise noted in the HPI.   EKGs/Labs/Other Studies Reviewed:    Studies reviewed were summarized above. The additional studies were reviewed today:  2D echo 10/23/2023: 1. Left ventricular ejection fraction, by estimation, is 55 to 60%. Left  ventricular ejection fraction by 3D volume is 63 %. The left ventricle has  normal function. The left ventricle has no regional wall motion  abnormalities. Left ventricular diastolic   parameters are consistent with Grade I diastolic dysfunction (impaired  relaxation).   2. Right ventricular systolic function is normal. The right ventricular  size is normal. There is normal pulmonary artery systolic pressure. The  estimated right ventricular systolic pressure is 33.0 mmHg.   3. Left atrial size was mild to moderately dilated.   4. The mitral valve is normal in structure. Moderate mitral valve  regurgitation. No evidence of mitral stenosis.   5. The aortic valve has an indeterminant number of cusps. Aortic valve  regurgitation is not visualized. No aortic stenosis is present.   6. The inferior vena cava is dilated in size with >50% respiratory  variability, suggesting right atrial pressure of 8 mmHg.  __________   LHC 09/25/2023: Diagnostic Dominance: Right Left Main  Vessel was injected. Vessel is normal in caliber. Vessel is angiographically normal.    Left Anterior Descending  Prox LAD lesion is 85% stenosed. The lesion is focal.    Left Circumflex  Vessel was injected. Vessel is normal in caliber. The vessel exhibits minimal luminal irregularities.    Right Coronary Artery  Vessel  was injected. Vessel is large. Vessel is angiographically normal.          Single vessel obstructive CAD involving the proximal LAD Normal LV function Normal LVEDP Successful PCI of the proximal LAD with DES x 1   Plan: DAPT for one year. Anticipate same day DC __________   Lexiscan  MPI 11/08/2018: Pharmacological myocardial perfusion imaging study with no significant  Ischemia Breast attenuation artifact noted Normal wall motion, EF could not be estimated  secondary to technical issues No EKG changes concerning for ischemia at peak stress or in recovery. Low risk scan __________   2D echo 10/05/2017: - Left ventricle: The cavity size was normal. There was mild    concentric hypertrophy. Systolic function was vigorous. The    estimated ejection fraction was in the range of 65% to 70%. Wall    motion was normal; there were no regional wall motion    abnormalities. Doppler parameters are consistent with abnormal    left ventricular relaxation (grade 1 diastolic dysfunction).  __________   See CV Studies in Epic for more remote cardiac imaging   EKG:  EKG is ordered today.  The EKG ordered today demonstrates ***  Recent Labs: 11/30/2023: ALT 18 12/11/2023: BUN 16; Creatinine, Ser 0.61; Hemoglobin 11.9; Platelets 325; Potassium 3.7; Sodium 137  Recent Lipid Panel    Component Value Date/Time   CHOL 80 (L) 11/30/2023 0903   CHOL 151 10/17/2011 0826   TRIG 130 11/30/2023 0903   TRIG 136 10/17/2011 0826   HDL 36 (L) 11/30/2023 0903   HDL 44 10/17/2011 0826   CHOLHDL 2.2 11/30/2023 0903   CHOLHDL 3 11/22/2022 1558   VLDL 50.0 (H) 11/22/2022 1558   VLDL 27 10/17/2011 0826   LDLCALC 21 11/30/2023 0903   LDLCALC 80 10/17/2011 0826   LDLDIRECT 19 11/30/2023 0903   LDLDIRECT 62.0 11/22/2022 1558    PHYSICAL EXAM:    VS:  There were no vitals taken for this visit.  BMI: There is no height or weight on file to calculate BMI.  Physical Exam  Wt Readings from Last 3  Encounters:  01/17/24 158 lb (71.7 kg)  12/28/23 159 lb (72.1 kg)  12/11/23 157 lb (71.2 kg)     ASSESSMENT & PLAN:   CAD involving the native coronary arteries with ***:  HLD/hypertriglyceridemia: LDL 19 with a triglyceride 130 in 11/2023.   {Are you ordering a CV Procedure (e.g. stress test, cath, DCCV, TEE, etc)?   Press F2        :[id number]}     Disposition: F/u with Dr. Gollan or an APP in ***.   Medication Adjustments/Labs and Tests Ordered: Current medicines are reviewed at length with the patient today.  Concerns regarding medicines are outlined above. Medication changes, Labs and Tests ordered today are summarized above and listed in the Patient Instructions accessible in Encounters.   Signed, Bernardino Bring, PA-C 02/04/2024 9:03 AM     [facility] [place] [address] [place], [place] [address] [phone]

## 2024-02-05 NOTE — Progress Notes (Unsigned)
     Jakyri Brunkhorst T. Antwane Grose, MD, CAQ Sports Medicine [facility] at [facility] [address] [place], [address]  Phone: [phone]  FAX: [phone]  Brandi Mahoney - 68 y.o. female  MRN [id number]  Date of Birth: 1955-12-20  Date: 02/06/2024  PCP: Watt Mirza, MD  Referral: Watt Mirza, MD  No chief complaint on file.  Virtual Visit via Video Note:  I connected with  Jim Philemon Stailey on 02/06/2024  9:40 AM EDT by a video enabled telemedicine application and verified that I am speaking with the correct person using two identifiers.   Location patient: home computer, tablet, or smartphone Location provider: work or home office Consent: Verbal consent directly obtained from Jhada K Stempel. Persons participating in the virtual visit: patient, provider  I discussed the limitations of evaluation and management by telemedicine and the availability of in person appointments. The patient expressed understanding and agreed to proceed.  No chief complaint on file.   History of Present Illness:    Review of Systems as above: See pertinent positives and pertinent negatives per HPI No acute distress verbally   Observations/Objective/Exam:  An attempt was made to discern vital signs over the phone and per patient if applicable and possible.   General:    Alert, Oriented, appears well and in no acute distress  Pulmonary:     On inspection no signs of respiratory distress.  Psych / Neurological:     Pleasant and cooperative.  Assessment and Plan:    ICD-10-CM   1. COVID-19  U07.1        I discussed the assessment and treatment plan with the patient. The patient was provided an opportunity to ask questions and all were answered. The patient agreed with the plan and demonstrated an understanding of the instructions.   The patient was advised to call back or seek an in-person evaluation if the symptoms worsen or if the condition fails to  improve as anticipated.  Follow-up: prn unless noted otherwise below No follow-ups on file.  No orders of the defined types were placed in this encounter.  No orders of the defined types were placed in this encounter.   Signed,  Mirza DASEN. Hussien Greenblatt, MD

## 2024-02-06 ENCOUNTER — Telehealth (INDEPENDENT_AMBULATORY_CARE_PROVIDER_SITE_OTHER): Admitting: Family Medicine

## 2024-02-06 ENCOUNTER — Encounter: Payer: Self-pay | Admitting: Family Medicine

## 2024-02-06 ENCOUNTER — Other Ambulatory Visit: Payer: Self-pay | Admitting: Family Medicine

## 2024-02-06 VITALS — Temp 98.5°F | Ht 64.0 in

## 2024-02-06 DIAGNOSIS — U071 COVID-19: Secondary | ICD-10-CM

## 2024-02-06 MED ORDER — NIRMATRELVIR/RITONAVIR (PAXLOVID)TABLET: 3.0000 | ORAL_TABLET | Freq: Two times a day (BID) | ORAL | 0 refills | Status: AC

## 2024-02-07 ENCOUNTER — Ambulatory Visit: Admitting: Physician Assistant

## 2024-02-07 DIAGNOSIS — I25119 Atherosclerotic heart disease of native coronary artery with unspecified angina pectoris: Secondary | ICD-10-CM

## 2024-02-07 DIAGNOSIS — E785 Hyperlipidemia, unspecified: Secondary | ICD-10-CM

## 2024-02-07 DIAGNOSIS — E781 Pure hyperglyceridemia: Secondary | ICD-10-CM

## 2024-02-07 DIAGNOSIS — I729 Aneurysm of unspecified site: Secondary | ICD-10-CM

## 2024-02-11 ENCOUNTER — Telehealth: Payer: Self-pay

## 2024-02-11 ENCOUNTER — Other Ambulatory Visit (HOSPITAL_COMMUNITY): Payer: Self-pay

## 2024-02-11 ENCOUNTER — Encounter

## 2024-02-11 ENCOUNTER — Other Ambulatory Visit: Payer: Self-pay | Admitting: Pharmacist

## 2024-02-11 NOTE — Telephone Encounter (Signed)
 Patient has been out of rehab since 01/18/2024. Left voicemail for patient to call back.

## 2024-02-11 NOTE — Progress Notes (Signed)
 Pharmacy Quality Measure Review  This patient is appearing on a report for being at risk of failing the adherence measure for cholesterol (statin) medications this calendar year.   Medication: pravastatin  40 mg Last fill date: 10/11/23 for 90 day supply  Medication refill declined due to on rosuvastatin , 40 mg daily last filled 09/25/23 x90 ds.  Transitioned to rosuvastatin  at hospital encounter April 2025.   Appears rosuvastatin  Rx is still owned by [facility].  Called patient. Reviewed wit her she is no longer on pravastatin , should be taking rosuvastatin .  Patient will call her home pharmacy [facility]) and have the Rx transferred. Patient provided with [facility] # on the prescription for the transfer.

## 2024-02-12 ENCOUNTER — Other Ambulatory Visit: Payer: Self-pay | Admitting: Family Medicine

## 2024-02-13 ENCOUNTER — Encounter

## 2024-02-13 DIAGNOSIS — Z955 Presence of coronary angioplasty implant and graft: Secondary | ICD-10-CM

## 2024-02-13 NOTE — Progress Notes (Signed)
 Cardiac Individual Treatment Plan  Patient Details  Name: JANAVIA ROTTMAN MRN: [id number] Date of Birth: [birth year] Referring Provider:   Flowsheet Row Cardiac Rehab from 10/24/2023 in [facility] Cardiac and Pulmonary Rehab  Referring Provider Dr. Evalene Lunger, MD    Initial Encounter Date:  Flowsheet Row Cardiac Rehab from 10/24/2023 in [facility] Cardiac and Pulmonary Rehab  Date 10/24/23    Visit Diagnosis: Status post coronary artery stent placement  Patient's Home Medications on Admission:  Current Outpatient Medications:    albuterol  (VENTOLIN  HFA) 108 (90 Base) MCG/ACT inhaler, Inhale 2 puffs into the lungs every 6 (six) hours as needed for wheezing or shortness of breath., Disp: 8 g, Rfl: 2   amLODipine  (NORVASC ) 2.5 MG tablet, Take 1 tablet (2.5 mg total) by mouth daily., Disp: 30 tablet, Rfl: 1   aspirin  EC 81 MG tablet, Take 81 mg by mouth daily., Disp: , Rfl:    clobetasol  ointment (TEMOVATE ) 0.05 %, Apply 1 Application topically daily as needed (skin conditions)., Disp: , Rfl:    fenofibrate  160 MG tablet, TAKE 1 TABLET BY MOUTH DAILY, Disp: 90 tablet, Rfl: 3   fluticasone (FLONASE) 50 MCG/ACT nasal spray, Place 2 sprays into both nostrils in the morning and at bedtime., Disp: , Rfl:    glucose blood (ACCU-CHEK GUIDE) test strip, Use to check blood sugar daily, Disp: 100 each, Rfl: 3   Lactobacillus-Inulin (CULTURELLE ADULT ULT BALANCE PO), Take 1 tablet by mouth daily., Disp: , Rfl:    levothyroxine  (SYNTHROID ) 75 MCG tablet, TAKE ONE TABLET BY MOUTH EVERY MORNING BEFORE BREAKFAST, Disp: 90 tablet, Rfl: 3   Magnesium  250 MG TABS, Take 250 mg by mouth daily., Disp: , Rfl:    metFORMIN  (GLUCOPHAGE -XR) 500 MG 24 hr tablet, Take 1,000 mg by mouth 2 (two) times daily with a meal., Disp: , Rfl:    Multiple Vitamins-Minerals (ONE-A-DAY WOMENS PETITES PO), Take 1 tablet by mouth daily., Disp: , Rfl:    nitroGLYCERIN  (NITROSTAT ) 0.4 MG SL tablet, Place 1 tablet under tongue at onset of chest  pain; you may repeat every 5 minutes for up to 3 doses., Disp: 25 tablet, Rfl: 3   pantoprazole  (PROTONIX ) 40 MG tablet, Take 1 tablet (40 mg total) by mouth daily., Disp: 90 tablet, Rfl: 3   prasugrel  (EFFIENT ) 10 MG TABS tablet, Take 1 tablet (10 mg total) by mouth daily., Disp: 30 tablet, Rfl: 11   rosuvastatin  (CRESTOR ) 40 MG tablet, Take 1 tablet (40 mg total) by mouth daily., Disp: 90 tablet, Rfl: 3   sertraline  (ZOLOFT ) 50 MG tablet, TAKE 1 TABLET BY MOUTH DAILY, Disp: 90 tablet, Rfl: 0   traMADol (ULTRAM) 50 MG tablet, Take 50 mg by mouth 2 (two) times daily as needed., Disp: , Rfl:    traZODone  (DESYREL ) 100 MG tablet, TAKE 1 TABLET BY MOUTH AT BEDTIME, Disp: 30 tablet, Rfl: 2  Past Medical History: Past Medical History:  Diagnosis Date   CAD (coronary artery disease)    a. 01/2010 Cath: mild atherosclerosis in the mid LCX after takeoff of OM1, mild diff LAD dzs;  b. 07/2013 MV: EF 67%, no ischemia.   GAD (generalized anxiety disorder)    History of kidney stones    HLD (hyperlipidemia)    Hypothyroidism    Major depressive disorder, recurrent episode, in full remission (HCC) 09/08/2008   Qualifier: Diagnosis of  By: Watt MD, Spencer     Migraine headache    Myocarditis (HCC)    a. 01/2010 presented w/ c/p  and elev trop-->Cath: mild atherosclerosis in the mid LCX after takeoff of OM1, mild diff LAD dzs;  b. 03/2010 Echo: EF 60-65%, DD, mild LVH;  c. 07/2011 Echo: EF 55-60%, mild LVH, no rwma.   Sleep apnea    has c-pap.  wears off and on - not lately  using  per reported 07-29-2020   Type 2 diabetes, diet controlled (HCC)     Tobacco Use: Social History   Tobacco Use  Smoking Status Former   Current packs/day: 0.00   Average packs/day: 0.4 packs/day for 35.0 years (12.3 ttl pk-yrs)   Types: Cigarettes   Start date: 09/25/1973   Quit date: 09/25/2008   Years since quitting: 15.3  Smokeless Tobacco Never  Tobacco Comments   quit in 2007-2008, social smoker    Labs: Review  Flowsheet  More data exists      Latest Ref Rng & Units 12/27/2021 08/23/2022 11/22/2022 08/06/2023 11/30/2023  Labs for ITP Cardiac and Pulmonary Rehab  Cholestrol 100 - 199 mg/dL - - 875  - 80   LDL (calc) 0 - 99 mg/dL - - - - 21   Direct LDL 0 - 99 mg/dL - - 37.9  - 19   HDL-C >39 mg/dL - - 59.89  - 36   Trlycerides 0 - 149 mg/dL - - 749.9  - 869   Hemoglobin A1c 4.0 - 5.6 % 7.2  8.3  7.8  6.4  -     Exercise Target Goals: Exercise Program Goal: Individual exercise prescription set using results from initial 6 min walk test and THRR while considering  patient's activity barriers and safety.   Exercise Prescription Goal: Initial exercise prescription builds to 30-45 minutes a day of aerobic activity, 2-3 days per week.  Home exercise guidelines will be given to patient during program as part of exercise prescription that the participant will acknowledge.   Education: Aerobic Exercise: - Group verbal and visual presentation on the components of exercise prescription. Introduces F.I.T.T principle from ACSM for exercise prescriptions.  Reviews F.I.T.T. principles of aerobic exercise including progression. Written material provided at class time.   Education: Resistance Exercise: - Group verbal and visual presentation on the components of exercise prescription. Introduces F.I.T.T principle from ACSM for exercise prescriptions  Reviews F.I.T.T. principles of resistance exercise including progression. Written material provided at class time.    Education: Exercise & Equipment Safety: - Individual verbal instruction and demonstration of equipment use and safety with use of the equipment. Flowsheet Row Cardiac Rehab from 10/24/2023 in [facility] Cardiac and Pulmonary Rehab  Date 10/24/23  Educator NT  Instruction Review Code 1- Verbalizes Understanding    Education: Exercise Physiology & General Exercise Guidelines: - Group verbal and written instruction with models to review the exercise  physiology of the cardiovascular system and associated critical values. Provides general exercise guidelines with specific guidelines to those with heart or lung disease. Written material provided at class time.   Education: Flexibility, Balance, Mind/Body Relaxation: - Group verbal and visual presentation with interactive activity on the components of exercise prescription. Introduces F.I.T.T principle from ACSM for exercise prescriptions. Reviews F.I.T.T. principles of flexibility and balance exercise training including progression. Also discusses the mind body connection.  Reviews various relaxation techniques to help reduce and manage stress (i.e. Deep breathing, progressive muscle relaxation, and visualization). Balance handout provided to take home. Written material provided at class time.   Activity Barriers & Risk Stratification:  Activity Barriers & Cardiac Risk Stratification - 10/23/23 1306  Activity Barriers & Cardiac Risk Stratification   Activity Barriers Back Problems;Arthritis    Cardiac Risk Stratification Moderate          6 Minute Walk:  6 Minute Walk     Row Name 10/24/23 1127         6 Minute Walk   Phase Initial     Distance 1235 feet     Walk Time 6 minutes     # of Rest Breaks 0     MPH 2.34     METS 2.96     RPE 10     Perceived Dyspnea  1     VO2 Peak 10.34     Symptoms No     Resting HR 78 bpm     Resting BP 128/64     Resting Oxygen Saturation  96 %     Exercise Oxygen Saturation  during 6 min walk 96 %     Max Ex. HR 99 bpm     Max Ex. BP 130/66     2 Minute Post BP 122/58        Oxygen Initial Assessment:   Oxygen Re-Evaluation:   Oxygen Discharge (Final Oxygen Re-Evaluation):   Initial Exercise Prescription:  Initial Exercise Prescription - 10/24/23 1100       Date of Initial Exercise RX and Referring Provider   Date 10/24/23    Referring Provider Dr. Timothy Gollan, MD      Oxygen   Maintain Oxygen Saturation 88% or  higher      Treadmill   MPH 2.3    Grade 1    Minutes 15    METs 3.08      NuStep   Level 2    SPM 80    Minutes 15    METs 2.96      REL-XR   Level 2    Speed 50    Minutes 15    METs 2.96      Prescription Details   Frequency (times per week) 3    Duration Progress to 30 minutes of continuous aerobic without signs/symptoms of physical distress      Intensity   THRR 40-80% of Max Heartrate 108-138    Ratings of Perceived Exertion 11-13    Perceived Dyspnea 0-4      Progression   Progression Continue to progress workloads to maintain intensity without signs/symptoms of physical distress.      Resistance Training   Training Prescription Yes    Weight 4 lb    Reps 10-15          Perform Capillary Blood Glucose checks as needed.  Exercise Prescription Changes:   Exercise Prescription Changes     Row Name 10/24/23 1100 11/13/23 1400 11/29/23 0800 12/12/23 0800 12/27/23 1600     Response to Exercise   Blood Pressure (Admit) 128/64 118/60 124/62 134/63 128/64   Blood Pressure (Exercise) 130/66 134/68 148/70 132/52 --   Blood Pressure (Exit) 122/58 118/62 122/64 120/60 110/60   Heart Rate (Admit) 78 bpm 77 bpm 80 bpm 74 bpm 77 bpm   Heart Rate (Exercise) 99 bpm 110 bpm 103 bpm 105 bpm 117 bpm   Heart Rate (Exit) 75 bpm 64 bpm 78 bpm 73 bpm 68 bpm   Oxygen Saturation (Admit) 96 % -- -- -- --   Oxygen Saturation (Exercise) 96 % -- -- -- --   Rating of Perceived Exertion (Exercise) 10 13 13 12 13    Perceived Dyspnea (Exercise)  1 0 -- -- --   Symptoms none none none none none   Comments Results first 2 weeks of exercise -- -- --   Duration -- Progress to 30 minutes of  aerobic without signs/symptoms of physical distress Continue with 30 min of aerobic exercise without signs/symptoms of physical distress. Continue with 30 min of aerobic exercise without signs/symptoms of physical distress. Continue with 30 min of aerobic exercise without signs/symptoms of  physical distress.   Intensity -- THRR unchanged THRR unchanged THRR unchanged THRR unchanged     Progression   Progression -- Continue to progress workloads to maintain intensity without signs/symptoms of physical distress. Continue to progress workloads to maintain intensity without signs/symptoms of physical distress. Continue to progress workloads to maintain intensity without signs/symptoms of physical distress. Continue to progress workloads to maintain intensity without signs/symptoms of physical distress.   Average METs -- 2.4 3.34 2.6 2.9     Resistance Training   Training Prescription -- Yes Yes Yes Yes   Weight -- 4 lb 4 lb 4 lb 4 lb   Reps -- 10-15 10-15 10-15 10-15     Interval Training   Interval Training -- No No No No     Treadmill   MPH -- 2.2 2.2 2 2.2   Grade -- 1 1 0 0   Minutes -- 15 15 15 15    METs -- 2.99 2.99 2.53 2.69     NuStep   Level -- 2  T6 -- -- --   Minutes -- 15 -- -- --   METs -- 1.7 -- -- --     Elliptical   Level -- -- -- -- 1   Minutes -- -- -- -- 15   METs -- -- -- -- 3.7     REL-XR   Level -- 1 -- 1 2   Minutes -- 15 -- 15 15   METs -- 1.4 -- 2.3 3     T5 Nustep   Level -- -- -- 1 --   Minutes -- -- -- 15 --   METs -- -- -- 1.7 --     Rower   Level -- -- 3 4 --   Watts -- -- 15 10 --   Minutes -- -- 15 15 --   METs -- -- 4.28 4.05 --     Oxygen   Maintain Oxygen Saturation -- 88% or higher 88% or higher 88% or higher 88% or higher    Row Name 01/08/24 1400 01/24/24 1700           Response to Exercise   Blood Pressure (Admit) 134/70 118/62      Blood Pressure (Exit) 130/80 116/60      Heart Rate (Admit) 71 bpm 89 bpm      Heart Rate (Exercise) 114 bpm 116 bpm      Heart Rate (Exit) 82 bpm 85 bpm      Rating of Perceived Exertion (Exercise) 12 15      Symptoms none none      Duration Continue with 30 min of aerobic exercise without signs/symptoms of physical distress. Continue with 30 min of aerobic exercise without  signs/symptoms of physical distress.      Intensity THRR unchanged THRR unchanged        Progression   Progression Continue to progress workloads to maintain intensity without signs/symptoms of physical distress. Continue to progress workloads to maintain intensity without signs/symptoms of physical distress.      Average  METs 2.9 2.67        Resistance Training   Training Prescription Yes Yes      Weight 4 lb 4 lb      Reps 10-15 10-15        Interval Training   Interval Training No No        Treadmill   MPH 2.1 2.2      Grade 0 0      Minutes 15 15      METs 2.61 2.69        Recumbant Bike   Level 3 --      Watts 18 --      Minutes 15 --      METs 3.09 --        NuStep   Level -- 2  T6      Minutes -- 15      METs -- 1.6        Elliptical   Level -- 1      Minutes -- 15      METs -- 3.9        REL-XR   Level -- 1      Minutes -- 15        Oxygen   Maintain Oxygen Saturation 88% or higher 88% or higher         Exercise Comments:   Exercise Comments     Row Name 10/29/23 0954           Exercise Comments First full day of exercise!  Patient was oriented to gym and equipment including functions, settings, policies, and procedures.  Patient's individual exercise prescription and treatment plan were reviewed.  All starting workloads were established based on the results of the 6 minute walk test done at initial orientation visit.  The plan for exercise progression was also introduced and progression will be customized based on patient's performance and goals.          Exercise Goals and Review:   Exercise Goals     Row Name 10/24/23 1128             Exercise Goals   Increase Physical Activity Yes       Intervention Provide advice, education, support and counseling about physical activity/exercise needs.;Develop an individualized exercise prescription for aerobic and resistive training based on initial evaluation findings, risk stratification,  comorbidities and participant's personal goals.       Expected Outcomes Short Term: Attend rehab on a regular basis to increase amount of physical activity.;Long Term: Add in home exercise to make exercise part of routine and to increase amount of physical activity.;Long Term: Exercising regularly at least 3-5 days a week.       Increase Strength and Stamina Yes       Intervention Provide advice, education, support and counseling about physical activity/exercise needs.;Develop an individualized exercise prescription for aerobic and resistive training based on initial evaluation findings, risk stratification, comorbidities and participant's personal goals.       Expected Outcomes Short Term: Increase workloads from initial exercise prescription for resistance, speed, and METs.;Short Term: Perform resistance training exercises routinely during rehab and add in resistance training at home;Long Term: Improve cardiorespiratory fitness, muscular endurance and strength as measured by increased METs and functional capacity ( )       Able to understand and use rate of perceived exertion (RPE) scale Yes       Intervention Provide education and explanation on how to use RPE scale  Expected Outcomes Short Term: Able to use RPE daily in rehab to express subjective intensity level;Long Term:  Able to use RPE to guide intensity level when exercising independently       Able to understand and use Dyspnea scale Yes       Intervention Provide education and explanation on how to use Dyspnea scale       Expected Outcomes Short Term: Able to use Dyspnea scale daily in rehab to express subjective sense of shortness of breath during exertion;Long Term: Able to use Dyspnea scale to guide intensity level when exercising independently       Knowledge and understanding of Target Heart Rate Range (THRR) Yes       Intervention Provide education and explanation of THRR including how the numbers were predicted and where they  are located for reference       Expected Outcomes Short Term: Able to state/look up THRR;Long Term: Able to use THRR to govern intensity when exercising independently;Short Term: Able to use daily as guideline for intensity in rehab       Able to check pulse independently Yes       Intervention Provide education and demonstration on how to check pulse in carotid and radial arteries.;Review the importance of being able to check your own pulse for safety during independent exercise       Expected Outcomes Short Term: Able to explain why pulse checking is important during independent exercise;Long Term: Able to check pulse independently and accurately       Understanding of Exercise Prescription Yes       Intervention Provide education, explanation, and written materials on patient's individual exercise prescription       Expected Outcomes Short Term: Able to explain program exercise prescription;Long Term: Able to explain home exercise prescription to exercise independently          Exercise Goals Re-Evaluation :  Exercise Goals Re-Evaluation     Row Name 10/29/23 0955 11/13/23 1437 11/29/23 0806 12/12/23 0831 12/27/23 1605     Exercise Goal Re-Evaluation   Exercise Goals Review Able to understand and use rate of perceived exertion (RPE) scale;Able to understand and use Dyspnea scale;Knowledge and understanding of Target Heart Rate Range (THRR) Increase Physical Activity;Increase Strength and Stamina;Understanding of Exercise Prescription Increase Physical Activity;Increase Strength and Stamina;Understanding of Exercise Prescription Increase Physical Activity;Increase Strength and Stamina;Understanding of Exercise Prescription Increase Physical Activity;Increase Strength and Stamina;Understanding of Exercise Prescription   Comments Reviewed RPE and dyspnea scale, THR and program prescription with pt today.  Pt voiced understanding and was given a copy of goals to take home. [name] is off to a good start  in the program. She was able to attend her first four sessions during this review. During her first sessions she was able to use the treadmill at a speed of 2. and 1% incline, and the T6 nustep at level 2. We will continue to monitor her progress in the program. [name] has only attended two sessions since the last review. She has continued to walk the treadmill at a speed of 2.2 mph with an incline of 1%. She also began using the rower at level 3. We will continue to monitor her progress in the program. [name] is doing well in rehab. She was recently able to increase from level 3 to 4 on the rowing machine. She was also able to maintain level 1 on the XR. We will continue to monitor her progress in the program. [name] is doing well  in rehab. Sheincreased her workload on the treadmill to a speed of 2.2 mph with no incline. She also increased to level 2 on the XR. She added the elliptical at level 1 and tolerated it well. We will continue to monitor her progress in the program.   Expected Outcomes Short: Use RPE daily to regulate intensity. Long: Follow program prescription in THR. Short: Continue to follow exercise prescription. Long: Continue exercise to imporve strength and stamina. Short: Attend rehab more regularly. Long: Continue exercise to imporve strength and stamina. Short: increase treadmill workload. Long: Continue exercise to improve strength and stamina. Short: Continue to progressively increase treadmill and XR workloads. Long: Continue exercise to improve strength and stamina.    Row Name 01/08/24 1425 01/24/24 1740 02/07/24 1125         Exercise Goal Re-Evaluation   Exercise Goals Review Increase Physical Activity;Increase Strength and Stamina;Understanding of Exercise Prescription Increase Physical Activity;Increase Strength and Stamina;Understanding of Exercise Prescription Increase Physical Activity;Increase Strength and Stamina;Understanding of Exercise Prescription     Comments [name] has only  attended one session since the last review. She was able to continue to walk the treadmill at a speed of 2.1 mph with no incline. She also began using the recumbent bike and did well at level 3. We will continue to monitor her progress in the program. [name] is doing well in rehab. She increased her workload on the treadmill to a speed of 2.2 mph with no incline. She maintained level 1 on the elliptical and XR. She also maintained level 2 on the T6. We will continue to monitor her progress in the program. [name] has not attended rehab since the last review. She has not been to rehab since 08/01. We will contact her to seen when she plans to return to the program. We will continue to monitor her progress.     Expected Outcomes Short: Attend rehab more consistently. Long: Continue exercise to improve strength and stamina. Short: Try level 2 on the XR. Long: Continue exercise to improve strength and stamina. Short: Return to rehab when appropriate. Long: Graduate.        Discharge Exercise Prescription (Final Exercise Prescription Changes):  Exercise Prescription Changes - 01/24/24 1700       Response to Exercise   Blood Pressure (Admit) 118/62    Blood Pressure (Exit) 116/60    Heart Rate (Admit) 89 bpm    Heart Rate (Exercise) 116 bpm    Heart Rate (Exit) 85 bpm    Rating of Perceived Exertion (Exercise) 15    Symptoms none    Duration Continue with 30 min of aerobic exercise without signs/symptoms of physical distress.    Intensity THRR unchanged      Progression   Progression Continue to progress workloads to maintain intensity without signs/symptoms of physical distress.    Average METs 2.67      Resistance Training   Training Prescription Yes    Weight 4 lb    Reps 10-15      Interval Training   Interval Training No      Treadmill   MPH 2.2    Grade 0    Minutes 15    METs 2.69      NuStep   Level 2   T6   Minutes 15    METs 1.6      Elliptical   Level 1    Minutes 15     METs 3.9      REL-XR  Level 1    Minutes 15      Oxygen   Maintain Oxygen Saturation 88% or higher          Nutrition:  Target Goals: Understanding of nutrition guidelines, daily intake of sodium 1500mg , cholesterol 200mg , calories 30% from fat and 7% or less from saturated fats, daily to have 5 or more servings of fruits and vegetables.  Education: Nutrition 1 -Group instruction provided by verbal, written material, interactive activities, discussions, models, and posters to present general guidelines for heart healthy nutrition including macronutrients, label reading, and promoting whole foods over processed counterparts. Education serves as Pensions consultant of discussion of heart healthy eating for all. Written material provided at class time.    Education: Nutrition 2 -Group instruction provided by verbal, written material, interactive activities, discussions, models, and posters to present general guidelines for heart healthy nutrition including sodium, cholesterol, and saturated fat. Providing guidance of habit forming to improve blood pressure, cholesterol, and body weight. Written material provided at class time.     Biometrics:  Pre Biometrics - 10/24/23 1130       Pre Biometrics   Height 5' 4.5 (1.638 m)    Weight 154 lb 14.4 oz (70.3 kg)    Waist Circumference 36 inches    Hip Circumference 39 inches    Waist to Hip Ratio 0.92 %    BMI (Calculated) 26.19    Single Leg Stand 4.9 seconds           Nutrition Therapy Plan and Nutrition Goals:  Nutrition Therapy & Goals - 10/24/23 1513       Nutrition Therapy   Diet Carb controlled, Cardiac, Low Na    Protein (specify units) 70-90g    Fiber 25 grams    Whole Grain Foods 3 servings    Saturated Fats 15 max. grams    Fruits and Vegetables 5 servings/day    Sodium 2 grams      Personal Nutrition Goals   Nutrition Goal Read labels and reduce sodium intake to below 2300mg . Ideally 1500mg  per day.    Personal  Goal #2 Eat 15-30gProtein and 30-60gCarbs at each meal.    Personal Goal #3 Reduce saturated fat, less than 12g per day. Replace bad fats for more heart healthy fats.    Comments Patient drinking ~50-75oz of water. She eats 3 meals per day, sometimes will have fruit as a snack between meals. She has DM and knows to monitor her carb intake. Reviewed goal of ~30-60g carbs per meal. Encouraged her to pair her carbs with protein. She has been watching her salt intake trying to stay below 2300mg . Provided her a guideline of less than 1500mg  per day. Reviewed mediterranean diet handout. Educated on types of fats, sources, and how to read labels. Reviewed several facts labels together. She is knowledgeable of foods and macros. She knows what to do and has followed a heart healthy diet years ago. She wants to set goal to be more strict on sodium and saturated fats, and stay consistent with her healthy eating.      Intervention Plan   Intervention Prescribe, educate and counsel regarding individualized specific dietary modifications aiming towards targeted core components such as weight, hypertension, lipid management, diabetes, heart failure and other comorbidities.;Nutrition handout(s) given to patient.    Expected Outcomes Short Term Goal: Understand basic principles of dietary content, such as calories, fat, sodium, cholesterol and nutrients.;Short Term Goal: A plan has been developed with personal nutrition goals set during dietitian appointment.;Long  Term Goal: Adherence to prescribed nutrition plan.          Nutrition Assessments:  MEDIFICTS Score Key: >=70 Need to make dietary changes  40-70 Heart Healthy Diet <= 40 Therapeutic Level Cholesterol Diet   Picture Your Plate Scores: <59 Unhealthy dietary pattern with much room for improvement. 41-50 Dietary pattern unlikely to meet recommendations for good health and room for improvement. 51-60 More healthful dietary pattern, with some room for  improvement.  >60 Healthy dietary pattern, although there may be some specific behaviors that could be improved.    Nutrition Goals Re-Evaluation:  Nutrition Goals Re-Evaluation     Row Name 11/26/23 0943             Goals   Current Weight 157 lb 3.2 oz (71.3 kg)       Comment [name] is complying with RD nutrition goals. She is still trying to lose some weight and says her target weight it 145lb.       Expected Outcome Short: Continue to diet and follow RD guidelines. Long: Continue to attend rehab and manage diet.          Nutrition Goals Discharge (Final Nutrition Goals Re-Evaluation):  Nutrition Goals Re-Evaluation - 11/26/23 0943       Goals   Current Weight 157 lb 3.2 oz (71.3 kg)    Comment [name] is complying with RD nutrition goals. She is still trying to lose some weight and says her target weight it 145lb.    Expected Outcome Short: Continue to diet and follow RD guidelines. Long: Continue to attend rehab and manage diet.          Psychosocial: Target Goals: Acknowledge presence or absence of significant depression and/or stress, maximize coping skills, provide positive support system. Participant is able to verbalize types and ability to use techniques and skills needed for reducing stress and depression.   Education: Stress, Anxiety, and Depression - Group verbal and visual presentation to define topics covered.  Reviews how body is impacted by stress, anxiety, and depression.  Also discusses healthy ways to reduce stress and to treat/manage anxiety and depression. Written material provided at class time.   Education: Sleep Hygiene -Provides group verbal and written instruction about how sleep can affect your health.  Define sleep hygiene, discuss sleep cycles and impact of sleep habits. Review good sleep hygiene tips.   Initial Review & Psychosocial Screening:  Initial Psych Review & Screening - 10/23/23 1310       Initial Review   Current issues with Current  Stress Concerns      Family Dynamics   Good Support System? Yes      Barriers   Psychosocial barriers to participate in program There are no identifiable barriers or psychosocial needs.;The patient should benefit from training in stress management and relaxation.      Screening Interventions   Interventions Encouraged to exercise;Provide feedback about the scores to participant;To provide support and resources with identified psychosocial needs    Expected Outcomes Short Term goal: Utilizing psychosocial counselor, staff and physician to assist with identification of specific Stressors or current issues interfering with healing process. Setting desired goal for each stressor or current issue identified.;Long Term Goal: Stressors or current issues are controlled or eliminated.;Short Term goal: Identification and review with participant of any Quality of Life or Depression concerns found by scoring the questionnaire.;Long Term goal: The participant improves quality of Life and PHQ9 Scores as seen by post scores and/or verbalization of changes  Quality of Life Scores:   Scores of 19 and below usually indicate a poorer quality of life in these areas.  A difference of  2-3 points is a clinically meaningful difference.  A difference of 2-3 points in the total score of the Quality of Life Index has been associated with significant improvement in overall quality of life, self-image, physical symptoms, and general health in studies assessing change in quality of life.  PHQ-9: Review Flowsheet  More data exists      11/14/2023 10/24/2023 12/06/2021 10/05/2021 01/28/2020  Depression screen PHQ 2/9  Decreased Interest 0 0 0 0 0  Down, Depressed, Hopeless 0 0 2 0 0  PHQ - 2 Score 0 0 2 0 0  Altered sleeping - 0 1 - -  Tired, decreased energy - 0 0 - -  Change in appetite - 0 0 - -  Feeling bad or failure about yourself  - 0 0 - -  Trouble concentrating - 0 0 - -  Moving slowly or  fidgety/restless - 0 0 - -  Suicidal thoughts - 0 0 - -  PHQ-9 Score - 0 3 - -  Difficult doing work/chores - - Somewhat difficult - -   Interpretation of Total Score  Total Score Depression Severity:  1-4 = Minimal depression, 5-9 = Mild depression, 10-14 = Moderate depression, 15-19 = Moderately severe depression, 20-27 = Severe depression   Psychosocial Evaluation and Intervention:  Psychosocial Evaluation - 10/23/23 1321       Psychosocial Evaluation & Interventions   Comments [name] is coming to cardiac rehab after a stent. 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 She also is being worked up for pulmonary issues and is scheduled for a CT and PFTs to help provide reasons as to why she feels short of breath. She states she feels like she is handling all her medical issues well, but admits it has been a lot on her. She focuses on one day at a time and being thankful for what she has. She has a great support system. She suffers with insomnia and has for years now, so getting good rest if difficult for her.    Expected Outcomes Short: attend cardiac rehab for education and exercise Long: develop and maintain positive self care habits    Continue Psychosocial Services  Follow up required by staff          Psychosocial Re-Evaluation:  Psychosocial Re-Evaluation     Row Name 11/26/23 0940             Psychosocial Re-Evaluation   Current issues with Current Stress Concerns       Comments [name] states that she isnt dealing with any stressors at this time. She does state that she deals insomnia, she was prescribed medication and that seems to be helping.       Expected Outcomes Short: Continue to comply with insomnia medication. Long: Continue to manage stress and insomnia.       Interventions Encouraged to attend Cardiac Rehabilitation for the exercise       Continue Psychosocial Services  Follow up  required by staff          Psychosocial Discharge (Final Psychosocial Re-Evaluation):  Psychosocial Re-Evaluation - 11/26/23 0940       Psychosocial Re-Evaluation   Current issues with Current Stress Concerns    Comments [name] states that she isnt dealing with any  stressors at this time. She does state that she deals insomnia, she was prescribed medication and that seems to be helping.    Expected Outcomes Short: Continue to comply with insomnia medication. Long: Continue to manage stress and insomnia.    Interventions Encouraged to attend Cardiac Rehabilitation for the exercise    Continue Psychosocial Services  Follow up required by staff          Vocational Rehabilitation: Provide vocational rehab assistance to qualifying candidates.   Vocational Rehab Evaluation & Intervention:  Vocational Rehab - 10/23/23 1310       Initial Vocational Rehab Evaluation & Intervention   Assessment shows need for Vocational Rehabilitation No          Education: Education Goals: Education classes will be provided on a variety of topics geared toward better understanding of heart health and risk factor modification. Participant will state understanding/return demonstration of topics presented as noted by education test scores.  Learning Barriers/Preferences:  Learning Barriers/Preferences - 10/23/23 1310       Learning Barriers/Preferences   Learning Barriers None    Learning Preferences None          General Cardiac Education Topics:  AED/CPR: - Group verbal and written instruction with the use of models to demonstrate the basic use of the AED with the basic ABC's of resuscitation.   Test and Procedures: - Group verbal and visual presentation and models provide information about basic cardiac anatomy and function. Reviews the testing methods done to diagnose heart disease and the outcomes of the test results. Describes the treatment choices: Medical Management, Angioplasty, or  Coronary Bypass Surgery for treating various heart conditions including Myocardial Infarction, Angina, Valve Disease, and Cardiac Arrhythmias. Written material provided at class time.   Medication Safety: - Group verbal and visual instruction to review commonly prescribed medications for heart and lung disease. Reviews the medication, class of the drug, and side effects. Includes the steps to properly store meds and maintain the prescription regimen. Written material provided at class time.   Intimacy: - Group verbal instruction through game format to discuss how heart and lung disease can affect sexual intimacy. Written material provided at class time.   Know Your Numbers and Heart Failure: - Group verbal and visual instruction to discuss disease risk factors for cardiac and pulmonary disease and treatment options.  Reviews associated critical values for Overweight/Obesity, Hypertension, Cholesterol, and Diabetes.  Discusses basics of heart failure: signs/symptoms and treatments.  Introduces Heart Failure Zone chart for action plan for heart failure. Written material provided at class time.   Infection Prevention: - Provides verbal and written material to individual with discussion of infection control including proper hand washing and proper equipment cleaning during exercise session. Flowsheet Row Cardiac Rehab from 10/24/2023 in [facility] Cardiac and Pulmonary Rehab  Date 10/24/23  Educator NT  Instruction Review Code 1- Verbalizes Understanding    Falls Prevention: - Provides verbal and written material to individual with discussion of falls prevention and safety. Flowsheet Row Cardiac Rehab from 10/24/2023 in [facility] Cardiac and Pulmonary Rehab  Date 10/24/23  Educator NT  Instruction Review Code 1- Verbalizes Understanding    Other: -Provides group and verbal instruction on various topics (see comments)   Knowledge Questionnaire Score:   Core Components/Risk Factors/Patient Goals at  Admission:  Personal Goals and Risk Factors at Admission - 10/23/23 1305       Core Components/Risk Factors/Patient Goals on Admission    Weight Management Yes;Weight Loss    Intervention  Weight Management: Develop a combined nutrition and exercise program designed to reach desired caloric intake, while maintaining appropriate intake of nutrient and fiber, sodium and fats, and appropriate energy expenditure required for the weight goal.;Weight Management: Provide education and appropriate resources to help participant work on and attain dietary goals.;Weight Management/Obesity: Establish reasonable short term and long term weight goals.    Goal Weight: Long Term 140 lb (63.5 kg)    Expected Outcomes Short Term: Continue to assess and modify interventions until short term weight is achieved;Long Term: Adherence to nutrition and physical activity/exercise program aimed toward attainment of established weight goal;Weight Loss: Understanding of general recommendations for a balanced deficit meal plan, which promotes 1-2 lb weight loss per week and includes a negative energy balance of [phone] kcal/d;Understanding recommendations for meals to include 15-35% energy as protein, 25-35% energy from fat, 35-60% energy from carbohydrates, less than 200mg  of dietary cholesterol, 20-35 gm of total fiber daily;Understanding of distribution of calorie intake throughout the day with the consumption of 4-5 meals/snacks    Diabetes Yes    Intervention Provide education about signs/symptoms and action to take for hypo/hyperglycemia.;Provide education about proper nutrition, including hydration, and aerobic/resistive exercise prescription along with prescribed medications to achieve blood glucose in normal ranges: Fasting glucose 65-99 mg/dL    Expected Outcomes Short Term: Participant verbalizes understanding of the signs/symptoms and immediate care of hyper/hypoglycemia, proper foot care and importance of medication,  aerobic/resistive exercise and nutrition plan for blood glucose control.;Long Term: Attainment of HbA1C < 7%.    Lipids Yes    Intervention Provide education and support for participant on nutrition & aerobic/resistive exercise along with prescribed medications to achieve LDL 70mg , HDL >40mg .    Expected Outcomes Short Term: Participant states understanding of desired cholesterol values and is compliant with medications prescribed. Participant is following exercise prescription and nutrition guidelines.;Long Term: Cholesterol controlled with medications as prescribed, with individualized exercise RX and with personalized nutrition plan. Value goals: LDL < 70mg , HDL > 40 mg.          Education:Diabetes - Individual verbal and written instruction to review signs/symptoms of diabetes, desired ranges of glucose level fasting, after meals and with exercise. Acknowledge that pre and post exercise glucose checks will be done for 3 sessions at entry of program. Flowsheet Row Cardiac Rehab from 10/24/2023 in [facility] Cardiac and Pulmonary Rehab  Date 10/23/23  Educator [facility]  Instruction Review Code 1- Verbalizes Understanding    Core Components/Risk Factors/Patient Goals Review:   Goals and Risk Factor Review     Row Name 11/26/23 0947             Core Components/Risk Factors/Patient Goals Review   Personal Goals Review Weight Management/Obesity;Diabetes       Review [name] is still trying to lose about 10 pounds, she continues to diet and attend rehab in order to achieve her goals. She continues to check her blood sugar, but admits not everyday. Talked with patient about the importance of checking blood sugar everyday.       Expected Outcomes Short: Start checking blood sugar everyday. Long: Continue to exercise and manage diet in order to lose weight and keep blood sugar levels within a healthy range.          Core Components/Risk Factors/Patient Goals at Discharge (Final Review):   Goals and Risk  Factor Review - 11/26/23 0947       Core Components/Risk Factors/Patient Goals Review   Personal Goals Review Weight Management/Obesity;Diabetes  Review [name] is still trying to lose about 10 pounds, she continues to diet and attend rehab in order to achieve her goals. She continues to check her blood sugar, but admits not everyday. Talked with patient about the importance of checking blood sugar everyday.    Expected Outcomes Short: Start checking blood sugar everyday. Long: Continue to exercise and manage diet in order to lose weight and keep blood sugar levels within a healthy range.          ITP Comments:  ITP Comments     Row Name 10/23/23 1302 10/24/23 1126 10/29/23 0954 11/21/23 0934 12/19/23 0814   ITP Comments Initial phone call completed. Diagnosis can be found in [facility] 4/8. EP Orientation scheduled for Wednesday 5/7 9:30. Completed and gym orientation for cardiac rehab. Initial ITP created and sent for review to Dr. Oneil Pinal, Medical Director. First full day of exercise!  Patient was oriented to gym and equipment including functions, settings, policies, and procedures.  Patient's individual exercise prescription and treatment plan were reviewed.  All starting workloads were established based on the results of the 6 minute walk test done at initial orientation visit.  The plan for exercise progression was also introduced and progression will be customized based on patient's performance and goals. 30 Day review completed. Medical Director ITP review done, changes made as directed, and signed approval by Medical Director.    new to program 30 Day review completed. Medical Director ITP review done, changes made as directed, and signed approval by Medical Director.    Row Name 01/16/24 0758 02/13/24 0827         ITP Comments 30 Day review completed. Medical Director ITP review done, changes made as directed, and signed approval by Medical Director. 30 Day review completed. Medical  Director ITP review done, changes made as directed, and signed approval by Medical Director.         Comments: 30 day review

## 2024-02-15 ENCOUNTER — Encounter

## 2024-02-16 ENCOUNTER — Other Ambulatory Visit: Payer: Self-pay | Admitting: Family Medicine

## 2024-02-20 ENCOUNTER — Encounter: Attending: Cardiovascular Disease | Admitting: Emergency Medicine

## 2024-02-20 DIAGNOSIS — Z955 Presence of coronary angioplasty implant and graft: Secondary | ICD-10-CM | POA: Insufficient documentation

## 2024-02-20 NOTE — Progress Notes (Signed)
 Daily Session Note  Patient Details  Name: Brandi Mahoney MRN: [id number] Date of Birth: 07-23-1955 Referring Provider:   Flowsheet Row Cardiac Rehab from 10/24/2023 in [facility] Cardiac and Pulmonary Rehab  Referring Provider Dr. Evalene Lunger, MD    Encounter Date: 02/20/2024  Check In:  Session Check In - 02/20/24 0940       Check-In   Supervising physician immediately available to respond to emergencies See telemetry face sheet for immediately available ER MD    Location [facility]-Cardiac & Pulmonary Rehab    Staff Present Fairy Plater RCP,RRT,BSRT;Renad Jenniges RN,BSN;Meredith Tressa RN,BSN;Kelly Dyane BS, ACSM CEP, Exercise Physiologist    Virtual Visit No    Medication changes reported     No    Fall or balance concerns reported    No    Warm-up and Cool-down Performed on first and last piece of equipment    Resistance Training Performed Yes    VAD Patient? No    PAD/SET Patient? No      Pain Assessment   Currently in Pain? No/denies             Social History   Tobacco Use  Smoking Status Former   Current packs/day: 0.00   Average packs/day: 0.4 packs/day for 35.0 years (12.3 ttl pk-yrs)   Types: Cigarettes   Start date: 09/25/1973   Quit date: 09/25/2008   Years since quitting: 15.4  Smokeless Tobacco Never  Tobacco Comments   quit in 2007-2008, social smoker    Goals Met:  Independence with exercise equipment Exercise tolerated well No report of concerns or symptoms today Strength training completed today  Goals Unmet:  Not Applicable  Comments: Pt able to follow exercise prescription today without complaint.  Will continue to monitor for progression.    Dr. Oneil Pinal is Medical Director for [facility] Cardiac Rehabilitation.  Dr. Fuad Aleskerov is Medical Director for [facility] Pulmonary Rehabilitation.

## 2024-02-22 ENCOUNTER — Encounter

## 2024-02-25 ENCOUNTER — Encounter

## 2024-02-26 ENCOUNTER — Other Ambulatory Visit: Payer: Self-pay | Admitting: Physician Assistant

## 2024-02-27 ENCOUNTER — Encounter: Admitting: Emergency Medicine

## 2024-02-27 ENCOUNTER — Encounter

## 2024-02-27 DIAGNOSIS — Z955 Presence of coronary angioplasty implant and graft: Secondary | ICD-10-CM | POA: Diagnosis not present

## 2024-02-27 NOTE — Progress Notes (Signed)
 Daily Session Note  Patient Details  Name: Brandi Mahoney MRN: [id number] Date of Birth: 25-Apr-1956 Referring Provider:   Flowsheet Row Cardiac Rehab from 10/24/2023 in [facility] Cardiac and Pulmonary Rehab  Referring Provider Dr. Evalene Lunger, MD    Encounter Date: 02/27/2024  Check In:  Session Check In - 02/27/24 0941       Check-In   Supervising physician immediately available to respond to emergencies See telemetry face sheet for immediately available ER MD    Location [facility]-Cardiac & Pulmonary Rehab    Staff Present Rollene Paterson, MS, Exercise Physiologist;Maxon Conetta BS, Exercise Physiologist;Jerod Mcquain RN,BSN    Virtual Visit No    Medication changes reported     No    Fall or balance concerns reported    No    Warm-up and Cool-down Performed on first and last piece of equipment    Resistance Training Performed Yes    VAD Patient? No    PAD/SET Patient? No      Pain Assessment   Currently in Pain? No/denies             Social History   Tobacco Use  Smoking Status Former   Current packs/day: 0.00   Average packs/day: 0.4 packs/day for 35.0 years (12.3 ttl pk-yrs)   Types: Cigarettes   Start date: 09/25/1973   Quit date: 09/25/2008   Years since quitting: 15.4  Smokeless Tobacco Never  Tobacco Comments   quit in 2007-2008, social smoker    Goals Met:  Independence with exercise equipment Exercise tolerated well No report of concerns or symptoms today Strength training completed today  Goals Unmet:  Not Applicable  Comments: Pt able to follow exercise prescription today without complaint.  Will continue to monitor for progression.    Dr. Oneil Pinal is Medical Director for [facility] Cardiac Rehabilitation.  Dr. Fuad Aleskerov is Medical Director for [facility] Pulmonary Rehabilitation.

## 2024-02-29 ENCOUNTER — Encounter

## 2024-02-29 DIAGNOSIS — Z955 Presence of coronary angioplasty implant and graft: Secondary | ICD-10-CM

## 2024-02-29 NOTE — Progress Notes (Signed)
 Daily Session Note  Patient Details  Name: Brandi Mahoney MRN: [id number] Date of Birth: 06-26-1955 Referring Provider:   Flowsheet Row Cardiac Rehab from 10/24/2023 in [facility] Cardiac and Pulmonary Rehab  Referring Provider Dr. Evalene Lunger, MD    Encounter Date: 02/29/2024  Check In:  Session Check In - 02/29/24 0923       Check-In   Supervising physician immediately available to respond to emergencies See telemetry face sheet for immediately available ER MD    Location [facility]-Cardiac & Pulmonary Rehab    Staff Present Burnard Davenport RN,BSN,MPA;Joseph Hood RCP,RRT,BSRT;Maxon Conetta BS, Exercise Physiologist;Noah Tickle, BS, Exercise Physiologist    Virtual Visit No    Medication changes reported     No    Fall or balance concerns reported    No    Warm-up and Cool-down Performed on first and last piece of equipment    Resistance Training Performed Yes    VAD Patient? No    PAD/SET Patient? No      Pain Assessment   Currently in Pain? No/denies             Social History   Tobacco Use  Smoking Status Former   Current packs/day: 0.00   Average packs/day: 0.4 packs/day for 35.0 years (12.3 ttl pk-yrs)   Types: Cigarettes   Start date: 09/25/1973   Quit date: 09/25/2008   Years since quitting: 15.4  Smokeless Tobacco Never  Tobacco Comments   quit in 2007-2008, social smoker    Goals Met:  Proper associated with RPD/PD & O2 Sat Independence with exercise equipment Exercise tolerated well No report of concerns or symptoms today Strength training completed today  Goals Unmet:  Not Applicable  Comments: Pt able to follow exercise prescription today without complaint.  Will continue to monitor for progression.    Dr. Oneil Pinal is Medical Director for [facility] Cardiac Rehabilitation.  Dr. Fuad Aleskerov is Medical Director for [facility] Pulmonary Rehabilitation.

## 2024-03-03 ENCOUNTER — Encounter

## 2024-03-03 DIAGNOSIS — Z955 Presence of coronary angioplasty implant and graft: Secondary | ICD-10-CM

## 2024-03-03 NOTE — Progress Notes (Signed)
 Daily Session Note  Patient Details  Name: Brandi Mahoney MRN: [id number] Date of Birth: 08-02-1955 Referring Provider:   Flowsheet Row Cardiac Rehab from 10/24/2023 in [facility] Cardiac and Pulmonary Rehab  Referring Provider Dr. Evalene Lunger, MD    Encounter Date: 03/03/2024  Check In:  Session Check In - 03/03/24 0917       Check-In   Supervising physician immediately available to respond to emergencies See telemetry face sheet for immediately available ER MD    Location [facility]-Cardiac & Pulmonary Rehab    Staff Present Burnard Davenport RN,BSN,MPA;Joseph Rolinda RCP,RRT,BSRT;Laura Cates RN,BSN;Valerye Kobus Dyane BS, ACSM CEP, Exercise Physiologist    Virtual Visit No    Medication changes reported     No    Fall or balance concerns reported    No    Warm-up and Cool-down Performed on first and last piece of equipment    Resistance Training Performed Yes    VAD Patient? No    PAD/SET Patient? No      Pain Assessment   Currently in Pain? No/denies             Social History   Tobacco Use  Smoking Status Former   Current packs/day: 0.00   Average packs/day: 0.4 packs/day for 35.0 years (12.3 ttl pk-yrs)   Types: Cigarettes   Start date: 09/25/1973   Quit date: 09/25/2008   Years since quitting: 15.4  Smokeless Tobacco Never  Tobacco Comments   quit in 2007-2008, social smoker    Goals Met:  Independence with exercise equipment Exercise tolerated well No report of concerns or symptoms today Strength training completed today  Goals Unmet:  Not Applicable  Comments: Pt able to follow exercise prescription today without complaint.  Will continue to monitor for progression.    Dr. Oneil Pinal is Medical Director for [facility] Cardiac Rehabilitation.  Dr. Fuad Aleskerov is Medical Director for [facility] Pulmonary Rehabilitation.

## 2024-03-05 ENCOUNTER — Encounter

## 2024-03-05 DIAGNOSIS — Z955 Presence of coronary angioplasty implant and graft: Secondary | ICD-10-CM

## 2024-03-05 NOTE — Progress Notes (Signed)
 Daily Session Note  Patient Details  Name: Brandi Mahoney MRN: [id number] Date of Birth: 1955-07-19 Referring Provider:   Flowsheet Row Cardiac Rehab from 10/24/2023 in [facility] Cardiac and Pulmonary Rehab  Referring Provider Dr. Evalene Lunger, MD    Encounter Date: 03/05/2024  Check In:  Session Check In - 03/05/24 0916       Check-In   Supervising physician immediately available to respond to emergencies See telemetry face sheet for immediately available ER MD    Location [facility]-Cardiac & Pulmonary Rehab    Staff Present Burnard Davenport RN,BSN,MPA;Joseph [facility] RCP,RRT,BSRT;Margaret Best, MS, Exercise Physiologist;Jason Elnor [facility]    Virtual Visit No    Medication changes reported     No    Fall or balance concerns reported    No    Warm-up and Cool-down Performed on first and last piece of equipment    Resistance Training Performed Yes    VAD Patient? No    PAD/SET Patient? No      Pain Assessment   Currently in Pain? No/denies             Social History   Tobacco Use  Smoking Status Former   Current packs/day: 0.00   Average packs/day: 0.4 packs/day for 35.0 years (12.3 ttl pk-yrs)   Types: Cigarettes   Start date: 09/25/1973   Quit date: 09/25/2008   Years since quitting: 15.4  Smokeless Tobacco Never  Tobacco Comments   quit in 2007-2008, social smoker    Goals Met:  Independence with exercise equipment Exercise tolerated well No report of concerns or symptoms today Strength training completed today  Goals Unmet:  Not Applicable  Comments: Pt able to follow exercise prescription today without complaint.  Will continue to monitor for progression.    Dr. Oneil Pinal is Medical Director for [facility] Cardiac Rehabilitation.  Dr. Fuad Aleskerov is Medical Director for [facility] Pulmonary Rehabilitation.

## 2024-03-07 ENCOUNTER — Encounter

## 2024-03-10 ENCOUNTER — Encounter

## 2024-03-10 DIAGNOSIS — K121 Other forms of stomatitis: Secondary | ICD-10-CM | POA: Diagnosis not present

## 2024-03-10 DIAGNOSIS — K117 Disturbances of salivary secretion: Secondary | ICD-10-CM | POA: Diagnosis not present

## 2024-03-12 ENCOUNTER — Encounter

## 2024-03-12 DIAGNOSIS — Z955 Presence of coronary angioplasty implant and graft: Secondary | ICD-10-CM

## 2024-03-12 NOTE — Progress Notes (Signed)
 Cardiac Individual Treatment Plan  Patient Details  Name: Brandi Mahoney MRN: [id number] Date of Birth: [birth year] Referring Provider:   Flowsheet Row Cardiac Rehab from 10/24/2023 in [facility] Cardiac and Pulmonary Rehab  Referring Provider Dr. Evalene Lunger, MD    Initial Encounter Date:  Flowsheet Row Cardiac Rehab from 10/24/2023 in [facility] Cardiac and Pulmonary Rehab  Date 10/24/23    Visit Diagnosis: Status post coronary artery stent placement  Patient's Home Medications on Admission:  Current Outpatient Medications:    albuterol  (VENTOLIN  HFA) 108 (90 Base) MCG/ACT inhaler, Inhale 2 puffs into the lungs every 6 (six) hours as needed for wheezing or shortness of breath., Disp: 8 g, Rfl: 2   amLODipine  (NORVASC ) 2.5 MG tablet, TAKE 1 TABLET BY MOUTH DAILY, Disp: 90 tablet, Rfl: 3   aspirin  EC 81 MG tablet, Take 81 mg by mouth daily., Disp: , Rfl:    clobetasol  ointment (TEMOVATE ) 0.05 %, Apply 1 Application topically daily as needed (skin conditions)., Disp: , Rfl:    fenofibrate  160 MG tablet, TAKE 1 TABLET BY MOUTH DAILY, Disp: 90 tablet, Rfl: 3   fluticasone (FLONASE) 50 MCG/ACT nasal spray, Place 2 sprays into both nostrils in the morning and at bedtime., Disp: , Rfl:    glucose blood (ACCU-CHEK GUIDE) test strip, Use to check blood sugar daily, Disp: 100 each, Rfl: 3   Lactobacillus-Inulin (CULTURELLE ADULT ULT BALANCE PO), Take 1 tablet by mouth daily., Disp: , Rfl:    levothyroxine  (SYNTHROID ) 75 MCG tablet, TAKE ONE TABLET BY MOUTH EVERY MORNING BEFORE BREAKFAST, Disp: 90 tablet, Rfl: 3   Magnesium  250 MG TABS, Take 250 mg by mouth daily., Disp: , Rfl:    metFORMIN  (GLUCOPHAGE -XR) 500 MG 24 hr tablet, Take 1,000 mg by mouth 2 (two) times daily with a meal., Disp: , Rfl:    Multiple Vitamins-Minerals (ONE-A-DAY WOMENS PETITES PO), Take 1 tablet by mouth daily., Disp: , Rfl:    nitroGLYCERIN  (NITROSTAT ) 0.4 MG SL tablet, Place 1 tablet under tongue at onset of chest pain; you may  repeat every 5 minutes for up to 3 doses., Disp: 25 tablet, Rfl: 3   pantoprazole  (PROTONIX ) 40 MG tablet, Take 1 tablet (40 mg total) by mouth daily., Disp: 90 tablet, Rfl: 3   prasugrel  (EFFIENT ) 10 MG TABS tablet, Take 1 tablet (10 mg total) by mouth daily., Disp: 30 tablet, Rfl: 11   rosuvastatin  (CRESTOR ) 40 MG tablet, Take 1 tablet (40 mg total) by mouth daily., Disp: 90 tablet, Rfl: 3   sertraline  (ZOLOFT ) 50 MG tablet, TAKE 1 TABLET BY MOUTH DAILY, Disp: 90 tablet, Rfl: 0   traMADol (ULTRAM) 50 MG tablet, Take 50 mg by mouth 2 (two) times daily as needed., Disp: , Rfl:    traZODone  (DESYREL ) 100 MG tablet, TAKE 1 TABLET BY MOUTH AT BEDTIME, Disp: 30 tablet, Rfl: 2  Past Medical History: Past Medical History:  Diagnosis Date   CAD (coronary artery disease)    a. 01/2010 Cath: mild atherosclerosis in the mid LCX after takeoff of OM1, mild diff LAD dzs;  b. 07/2013 MV: EF 67%, no ischemia.   GAD (generalized anxiety disorder)    History of kidney stones    HLD (hyperlipidemia)    Hypothyroidism    Major depressive disorder, recurrent episode, in full remission 09/08/2008   Qualifier: Diagnosis of  By: Copland MD, Spencer     Migraine headache    Myocarditis (HCC)    a. 01/2010 presented w/ c/p and elev trop-->Cath: mild  atherosclerosis in the mid LCX after takeoff of OM1, mild diff LAD dzs;  b. 03/2010 Echo: EF 60-65%, DD, mild LVH;  c. 07/2011 Echo: EF 55-60%, mild LVH, no rwma.   Sleep apnea    has c-pap.  wears off and on - not lately  using  per reported 07-29-2020   Type 2 diabetes, diet controlled (HCC)     Tobacco Use: Social History   Tobacco Use  Smoking Status Former   Current packs/day: 0.00   Average packs/day: 0.4 packs/day for 35.0 years (12.3 ttl pk-yrs)   Types: Cigarettes   Start date: 09/25/1973   Quit date: 09/25/2008   Years since quitting: 15.4  Smokeless Tobacco Never  Tobacco Comments   quit in 2007-2008, social smoker    Labs: Review Flowsheet  More data  exists      Latest Ref Rng & Units 12/27/2021 08/23/2022 11/22/2022 08/06/2023 11/30/2023  Labs for ITP Cardiac and Pulmonary Rehab  Cholestrol 100 - 199 mg/dL - - 875  - 80   LDL (calc) 0 - 99 mg/dL - - - - 21   Direct LDL 0 - 99 mg/dL - - 37.9  - 19   HDL-C >39 mg/dL - - 59.89  - 36   Trlycerides 0 - 149 mg/dL - - 749.9  - 869   Hemoglobin A1c 4.0 - 5.6 % 7.2  8.3  7.8  6.4  -     Exercise Target Goals: Exercise Program Goal: Individual exercise prescription set using results from initial 6 min walk test and THRR while considering  patient's activity barriers and safety.   Exercise Prescription Goal: Initial exercise prescription builds to 30-45 minutes a day of aerobic activity, 2-3 days per week.  Home exercise guidelines will be given to patient during program as part of exercise prescription that the participant will acknowledge.   Education: Aerobic Exercise: - Group verbal and visual presentation on the components of exercise prescription. Introduces F.I.T.T principle from ACSM for exercise prescriptions.  Reviews F.I.T.T. principles of aerobic exercise including progression. Written material provided at class time.   Education: Resistance Exercise: - Group verbal and visual presentation on the components of exercise prescription. Introduces F.I.T.T principle from ACSM for exercise prescriptions  Reviews F.I.T.T. principles of resistance exercise including progression. Written material provided at class time.    Education: Exercise & Equipment Safety: - Individual verbal instruction and demonstration of equipment use and safety with use of the equipment. Flowsheet Row Cardiac Rehab from 10/24/2023 in [facility] Cardiac and Pulmonary Rehab  Date 10/24/23  Educator NT  Instruction Review Code 1- Verbalizes Understanding    Education: Exercise Physiology & General Exercise Guidelines: - Group verbal and written instruction with models to review the exercise physiology of the  cardiovascular system and associated critical values. Provides general exercise guidelines with specific guidelines to those with heart or lung disease. Written material provided at class time.   Education: Flexibility, Balance, Mind/Body Relaxation: - Group verbal and visual presentation with interactive activity on the components of exercise prescription. Introduces F.I.T.T principle from ACSM for exercise prescriptions. Reviews F.I.T.T. principles of flexibility and balance exercise training including progression. Also discusses the mind body connection.  Reviews various relaxation techniques to help reduce and manage stress (i.e. Deep breathing, progressive muscle relaxation, and visualization). Balance handout provided to take home. Written material provided at class time.   Activity Barriers & Risk Stratification:  Activity Barriers & Cardiac Risk Stratification - 10/23/23 1306  Activity Barriers & Cardiac Risk Stratification   Activity Barriers Back Problems;Arthritis    Cardiac Risk Stratification Moderate          6 Minute Walk:  6 Minute Walk     Row Name 10/24/23 1127         6 Minute Walk   Phase Initial     Distance 1235 feet     Walk Time 6 minutes     # of Rest Breaks 0     MPH 2.34     METS 2.96     RPE 10     Perceived Dyspnea  1     VO2 Peak 10.34     Symptoms No     Resting HR 78 bpm     Resting BP 128/64     Resting Oxygen Saturation  96 %     Exercise Oxygen Saturation  during 6 min walk 96 %     Max Ex. HR 99 bpm     Max Ex. BP 130/66     2 Minute Post BP 122/58        Oxygen Initial Assessment:   Oxygen Re-Evaluation:   Oxygen Discharge (Final Oxygen Re-Evaluation):   Initial Exercise Prescription:  Initial Exercise Prescription - 10/24/23 1100       Date of Initial Exercise RX and Referring Provider   Date 10/24/23    Referring Provider Dr. Timothy Gollan, MD      Oxygen   Maintain Oxygen Saturation 88% or higher       Treadmill   MPH 2.3    Grade 1    Minutes 15    METs 3.08      NuStep   Level 2    SPM 80    Minutes 15    METs 2.96      REL-XR   Level 2    Speed 50    Minutes 15    METs 2.96      Prescription Details   Frequency (times per week) 3    Duration Progress to 30 minutes of continuous aerobic without signs/symptoms of physical distress      Intensity   THRR 40-80% of Max Heartrate 108-138    Ratings of Perceived Exertion 11-13    Perceived Dyspnea 0-4      Progression   Progression Continue to progress workloads to maintain intensity without signs/symptoms of physical distress.      Resistance Training   Training Prescription Yes    Weight 4 lb    Reps 10-15          Perform Capillary Blood Glucose checks as needed.  Exercise Prescription Changes:   Exercise Prescription Changes     Row Name 10/24/23 1100 11/13/23 1400 11/29/23 0800 12/12/23 0800 12/27/23 1600     Response to Exercise   Blood Pressure (Admit) 128/64 118/60 124/62 134/63 128/64   Blood Pressure (Exercise) 130/66 134/68 148/70 132/52 --   Blood Pressure (Exit) 122/58 118/62 122/64 120/60 110/60   Heart Rate (Admit) 78 bpm 77 bpm 80 bpm 74 bpm 77 bpm   Heart Rate (Exercise) 99 bpm 110 bpm 103 bpm 105 bpm 117 bpm   Heart Rate (Exit) 75 bpm 64 bpm 78 bpm 73 bpm 68 bpm   Oxygen Saturation (Admit) 96 % -- -- -- --   Oxygen Saturation (Exercise) 96 % -- -- -- --   Rating of Perceived Exertion (Exercise) 10 13 13 12 13    Perceived Dyspnea (Exercise)  1 0 -- -- --   Symptoms none none none none none   Comments Results first 2 weeks of exercise -- -- --   Duration -- Progress to 30 minutes of  aerobic without signs/symptoms of physical distress Continue with 30 min of aerobic exercise without signs/symptoms of physical distress. Continue with 30 min of aerobic exercise without signs/symptoms of physical distress. Continue with 30 min of aerobic exercise without signs/symptoms of physical distress.    Intensity -- THRR unchanged THRR unchanged THRR unchanged THRR unchanged     Progression   Progression -- Continue to progress workloads to maintain intensity without signs/symptoms of physical distress. Continue to progress workloads to maintain intensity without signs/symptoms of physical distress. Continue to progress workloads to maintain intensity without signs/symptoms of physical distress. Continue to progress workloads to maintain intensity without signs/symptoms of physical distress.   Average METs -- 2.4 3.34 2.6 2.9     Resistance Training   Training Prescription -- Yes Yes Yes Yes   Weight -- 4 lb 4 lb 4 lb 4 lb   Reps -- 10-15 10-15 10-15 10-15     Interval Training   Interval Training -- No No No No     Treadmill   MPH -- 2.2 2.2 2 2.2   Grade -- 1 1 0 0   Minutes -- 15 15 15 15    METs -- 2.99 2.99 2.53 2.69     NuStep   Level -- 2  T6 -- -- --   Minutes -- 15 -- -- --   METs -- 1.7 -- -- --     Elliptical   Level -- -- -- -- 1   Minutes -- -- -- -- 15   METs -- -- -- -- 3.7     REL-XR   Level -- 1 -- 1 2   Minutes -- 15 -- 15 15   METs -- 1.4 -- 2.3 3     T5 Nustep   Level -- -- -- 1 --   Minutes -- -- -- 15 --   METs -- -- -- 1.7 --     Rower   Level -- -- 3 4 --   Watts -- -- 15 10 --   Minutes -- -- 15 15 --   METs -- -- 4.28 4.05 --     Oxygen   Maintain Oxygen Saturation -- 88% or higher 88% or higher 88% or higher 88% or higher    Row Name 01/08/24 1400 01/24/24 1700 03/04/24 1500         Response to Exercise   Blood Pressure (Admit) 134/70 118/62 122/60     Blood Pressure (Exit) 130/80 116/60 116/64     Heart Rate (Admit) 71 bpm 89 bpm 89 bpm     Heart Rate (Exercise) 114 bpm 116 bpm 109 bpm     Heart Rate (Exit) 82 bpm 85 bpm 79 bpm     Rating of Perceived Exertion (Exercise) 12 15 12      Symptoms none none none     Duration Continue with 30 min of aerobic exercise without signs/symptoms of physical distress. Continue with 30 min  of aerobic exercise without signs/symptoms of physical distress. Continue with 30 min of aerobic exercise without signs/symptoms of physical distress.     Intensity THRR unchanged THRR unchanged THRR unchanged       Progression   Progression Continue to progress workloads to maintain intensity without signs/symptoms of physical distress. Continue to progress  workloads to maintain intensity without signs/symptoms of physical distress. Continue to progress workloads to maintain intensity without signs/symptoms of physical distress.     Average METs 2.9 2.67 3.08       Resistance Training   Training Prescription Yes Yes Yes     Weight 4 lb 4 lb 4 lb     Reps 10-15 10-15 10-15       Interval Training   Interval Training No No No       Treadmill   MPH 2.1 2.2 2.1     Grade 0 0 0.5     Minutes 15 15 15      METs 2.61 2.69 2.75       Recumbant Bike   Level 3 -- --     Watts 18 -- --     Minutes 15 -- --     METs 3.09 -- --       NuStep   Level -- 2  T6 --     Minutes -- 15 --     METs -- 1.6 --       Elliptical   Level -- 1 1     Minutes -- 15 15     METs -- 3.9 3.8       REL-XR   Level -- 1 --     Minutes -- 15 --       Oxygen   Maintain Oxygen Saturation 88% or higher 88% or higher 88% or higher        Exercise Comments:   Exercise Comments     Row Name 10/29/23 0954           Exercise Comments First full day of exercise!  Patient was oriented to gym and equipment including functions, settings, policies, and procedures.  Patient's individual exercise prescription and treatment plan were reviewed.  All starting workloads were established based on the results of the 6 minute walk test done at initial orientation visit.  The plan for exercise progression was also introduced and progression will be customized based on patient's performance and goals.          Exercise Goals and Review:   Exercise Goals     Row Name 10/24/23 1128             Exercise Goals    Increase Physical Activity Yes       Intervention Provide advice, education, support and counseling about physical activity/exercise needs.;Develop an individualized exercise prescription for aerobic and resistive training based on initial evaluation findings, risk stratification, comorbidities and participant's personal goals.       Expected Outcomes Short Term: Attend rehab on a regular basis to increase amount of physical activity.;Long Term: Add in home exercise to make exercise part of routine and to increase amount of physical activity.;Long Term: Exercising regularly at least 3-5 days a week.       Increase Strength and Stamina Yes       Intervention Provide advice, education, support and counseling about physical activity/exercise needs.;Develop an individualized exercise prescription for aerobic and resistive training based on initial evaluation findings, risk stratification, comorbidities and participant's personal goals.       Expected Outcomes Short Term: Increase workloads from initial exercise prescription for resistance, speed, and METs.;Short Term: Perform resistance training exercises routinely during rehab and add in resistance training at home;Long Term: Improve cardiorespiratory fitness, muscular endurance and strength as measured by increased METs and functional capacity ( )  Able to understand and use rate of perceived exertion (RPE) scale Yes       Intervention Provide education and explanation on how to use RPE scale       Expected Outcomes Short Term: Able to use RPE daily in rehab to express subjective intensity level;Long Term:  Able to use RPE to guide intensity level when exercising independently       Able to understand and use Dyspnea scale Yes       Intervention Provide education and explanation on how to use Dyspnea scale       Expected Outcomes Short Term: Able to use Dyspnea scale daily in rehab to express subjective sense of shortness of breath during  exertion;Long Term: Able to use Dyspnea scale to guide intensity level when exercising independently       Knowledge and understanding of Target Heart Rate Range (THRR) Yes       Intervention Provide education and explanation of THRR including how the numbers were predicted and where they are located for reference       Expected Outcomes Short Term: Able to state/look up THRR;Long Term: Able to use THRR to govern intensity when exercising independently;Short Term: Able to use daily as guideline for intensity in rehab       Able to check pulse independently Yes       Intervention Provide education and demonstration on how to check pulse in carotid and radial arteries.;Review the importance of being able to check your own pulse for safety during independent exercise       Expected Outcomes Short Term: Able to explain why pulse checking is important during independent exercise;Long Term: Able to check pulse independently and accurately       Understanding of Exercise Prescription Yes       Intervention Provide education, explanation, and written materials on patient's individual exercise prescription       Expected Outcomes Short Term: Able to explain program exercise prescription;Long Term: Able to explain home exercise prescription to exercise independently          Exercise Goals Re-Evaluation :  Exercise Goals Re-Evaluation     Row Name 10/29/23 0955 11/13/23 1437 11/29/23 0806 12/12/23 0831 12/27/23 1605     Exercise Goal Re-Evaluation   Exercise Goals Review Able to understand and use rate of perceived exertion (RPE) scale;Able to understand and use Dyspnea scale;Knowledge and understanding of Target Heart Rate Range (THRR) Increase Physical Activity;Increase Strength and Stamina;Understanding of Exercise Prescription Increase Physical Activity;Increase Strength and Stamina;Understanding of Exercise Prescription Increase Physical Activity;Increase Strength and Stamina;Understanding of Exercise  Prescription Increase Physical Activity;Increase Strength and Stamina;Understanding of Exercise Prescription   Comments Reviewed RPE and dyspnea scale, THR and program prescription with pt today.  Pt voiced understanding and was given a copy of goals to take home. [name] is off to a good start in the program. She was able to attend her first four sessions during this review. During her first sessions she was able to use the treadmill at a speed of 2. and 1% incline, and the T6 nustep at level 2. We will continue to monitor her progress in the program. [name] has only attended two sessions since the last review. She has continued to walk the treadmill at a speed of 2.2 mph with an incline of 1%. She also began using the rower at level 3. We will continue to monitor her progress in the program. [name] is doing well in rehab. She was recently able to  increase from level 3 to 4 on the rowing machine. She was also able to maintain level 1 on the XR. We will continue to monitor her progress in the program. [name] is doing well in rehab. Sheincreased her workload on the treadmill to a speed of 2.2 mph with no incline. She also increased to level 2 on the XR. She added the elliptical at level 1 and tolerated it well. We will continue to monitor her progress in the program.   Expected Outcomes Short: Use RPE daily to regulate intensity. Long: Follow program prescription in THR. Short: Continue to follow exercise prescription. Long: Continue exercise to imporve strength and stamina. Short: Attend rehab more regularly. Long: Continue exercise to imporve strength and stamina. Short: increase treadmill workload. Long: Continue exercise to improve strength and stamina. Short: Continue to progressively increase treadmill and XR workloads. Long: Continue exercise to improve strength and stamina.    Row Name 01/08/24 1425 01/24/24 1740 02/07/24 1125 02/21/24 0730 03/04/24 1525     Exercise Goal Re-Evaluation   Exercise Goals Review  Increase Physical Activity;Increase Strength and Stamina;Understanding of Exercise Prescription Increase Physical Activity;Increase Strength and Stamina;Understanding of Exercise Prescription Increase Physical Activity;Increase Strength and Stamina;Understanding of Exercise Prescription Increase Physical Activity;Increase Strength and Stamina;Understanding of Exercise Prescription Increase Physical Activity;Increase Strength and Stamina;Understanding of Exercise Prescription   Comments [name] has only attended one session since the last review. She was able to continue to walk the treadmill at a speed of 2.1 mph with no incline. She also began using the recumbent bike and did well at level 3. We will continue to monitor her progress in the program. [name] is doing well in rehab. She increased her workload on the treadmill to a speed of 2.2 mph with no incline. She maintained level 1 on the elliptical and XR. She also maintained level 2 on the T6. We will continue to monitor her progress in the program. [name] has not attended rehab since the last review. She has not been to rehab since 08/01. We will contact her to seen when she plans to return to the program. We will continue to monitor her progress. [name] has not attended rehab since the last review. She has not been to rehab since 08/01 due to being sick. We will continue to monitor her progress when she returns to the program. [name] has returned to the program and is doing well. She recently increased her treadmill workload to a speed of 2.1 mph with an incline of 0.5%. She also continues to work at level 1 on the elliptical. We will continue to monitor her progress in the program.   Expected Outcomes Short: Attend rehab more consistently. Long: Continue exercise to improve strength and stamina. Short: Try level 2 on the XR. Long: Continue exercise to improve strength and stamina. Short: Return to rehab when appropriate. Long: Graduate. Short: Return to rehab when  appropriate. Long: Graduate. Short: Continue to progressively increase workloads. Long: Continue exercise to improve strength and stamina.    Row Name 03/12/24 9047             Exercise Goal Re-Evaluation   Exercise Goals Review Increase Physical Activity;Increase Strength and Stamina;Understanding of Exercise Prescription       Comments [name] is doing well in rehab, She is also goign to [facility] some on days not at rehab. ENcouraged her to continue to exercise at home as well as work on increasing workloads here at rehab       Expected  Outcomes Short: Continue to progressively increase workloads. Long: Continue exercise to improve strength and stamina.          Discharge Exercise Prescription (Final Exercise Prescription Changes):  Exercise Prescription Changes - 03/04/24 1500       Response to Exercise   Blood Pressure (Admit) 122/60    Blood Pressure (Exit) 116/64    Heart Rate (Admit) 89 bpm    Heart Rate (Exercise) 109 bpm    Heart Rate (Exit) 79 bpm    Rating of Perceived Exertion (Exercise) 12    Symptoms none    Duration Continue with 30 min of aerobic exercise without signs/symptoms of physical distress.    Intensity THRR unchanged      Progression   Progression Continue to progress workloads to maintain intensity without signs/symptoms of physical distress.    Average METs 3.08      Resistance Training   Training Prescription Yes    Weight 4 lb    Reps 10-15      Interval Training   Interval Training No      Treadmill   MPH 2.1    Grade 0.5    Minutes 15    METs 2.75      Elliptical   Level 1    Minutes 15    METs 3.8      Oxygen   Maintain Oxygen Saturation 88% or higher          Nutrition:  Target Goals: Understanding of nutrition guidelines, daily intake of sodium 1500mg , cholesterol 200mg , calories 30% from fat and 7% or less from saturated fats, daily to have 5 or more servings of fruits and vegetables.  Education: Nutrition 1 -Group  instruction provided by verbal, written material, interactive activities, discussions, models, and posters to present general guidelines for heart healthy nutrition including macronutrients, label reading, and promoting whole foods over processed counterparts. Education serves as Pensions consultant of discussion of heart healthy eating for all. Written material provided at class time.    Education: Nutrition 2 -Group instruction provided by verbal, written material, interactive activities, discussions, models, and posters to present general guidelines for heart healthy nutrition including sodium, cholesterol, and saturated fat. Providing guidance of habit forming to improve blood pressure, cholesterol, and body weight. Written material provided at class time.     Biometrics:  Pre Biometrics - 10/24/23 1130       Pre Biometrics   Height 5' 4.5 (1.638 m)    Weight 154 lb 14.4 oz (70.3 kg)    Waist Circumference 36 inches    Hip Circumference 39 inches    Waist to Hip Ratio 0.92 %    BMI (Calculated) 26.19    Single Leg Stand 4.9 seconds           Nutrition Therapy Plan and Nutrition Goals:  Nutrition Therapy & Goals - 10/24/23 1513       Nutrition Therapy   Diet Carb controlled, Cardiac, Low Na    Protein (specify units) 70-90g    Fiber 25 grams    Whole Grain Foods 3 servings    Saturated Fats 15 max. grams    Fruits and Vegetables 5 servings/day    Sodium 2 grams      Personal Nutrition Goals   Nutrition Goal Read labels and reduce sodium intake to below 2300mg . Ideally 1500mg  per day.    Personal Goal #2 Eat 15-30gProtein and 30-60gCarbs at each meal.    Personal Goal #3 Reduce saturated fat, less than 12g  per day. Replace bad fats for more heart healthy fats.    Comments Patient drinking ~50-75oz of water. She eats 3 meals per day, sometimes will have fruit as a snack between meals. She has DM and knows to monitor her carb intake. Reviewed goal of ~30-60g carbs per meal.  Encouraged her to pair her carbs with protein. She has been watching her salt intake trying to stay below 2300mg . Provided her a guideline of less than 1500mg  per day. Reviewed mediterranean diet handout. Educated on types of fats, sources, and how to read labels. Reviewed several facts labels together. She is knowledgeable of foods and macros. She knows what to do and has followed a heart healthy diet years ago. She wants to set goal to be more strict on sodium and saturated fats, and stay consistent with her healthy eating.      Intervention Plan   Intervention Prescribe, educate and counsel regarding individualized specific dietary modifications aiming towards targeted core components such as weight, hypertension, lipid management, diabetes, heart failure and other comorbidities.;Nutrition handout(s) given to patient.    Expected Outcomes Short Term Goal: Understand basic principles of dietary content, such as calories, fat, sodium, cholesterol and nutrients.;Short Term Goal: A plan has been developed with personal nutrition goals set during dietitian appointment.;Long Term Goal: Adherence to prescribed nutrition plan.          Nutrition Assessments:  MEDIFICTS Score Key: >=70 Need to make dietary changes  40-70 Heart Healthy Diet <= 40 Therapeutic Level Cholesterol Diet   Picture Your Plate Scores: <59 Unhealthy dietary pattern with much room for improvement. 41-50 Dietary pattern unlikely to meet recommendations for good health and room for improvement. 51-60 More healthful dietary pattern, with some room for improvement.  >60 Healthy dietary pattern, although there may be some specific behaviors that could be improved.    Nutrition Goals Re-Evaluation:  Nutrition Goals Re-Evaluation     Row Name 11/26/23 0943 03/12/24 0956           Goals   Current Weight 157 lb 3.2 oz (71.3 kg) --      Comment [name] is complying with RD nutrition goals. She is still trying to lose some weight  and says her target weight it 145lb. [name] reports she is follow a heart healthy diet. Cutting back on sodium and saturated fat in her diet. Drinking adequate water      Expected Outcome Short: Continue to diet and follow RD guidelines. Long: Continue to attend rehab and manage diet. Short: Continue to diet and follow RD guidelines. Long: Continue to attend rehab and manage diet.         Nutrition Goals Discharge (Final Nutrition Goals Re-Evaluation):  Nutrition Goals Re-Evaluation - 03/12/24 0956       Goals   Comment [name] reports she is follow a heart healthy diet. Cutting back on sodium and saturated fat in her diet. Drinking adequate water    Expected Outcome Short: Continue to diet and follow RD guidelines. Long: Continue to attend rehab and manage diet.          Psychosocial: Target Goals: Acknowledge presence or absence of significant depression and/or stress, maximize coping skills, provide positive support system. Participant is able to verbalize types and ability to use techniques and skills needed for reducing stress and depression.   Education: Stress, Anxiety, and Depression - Group verbal and visual presentation to define topics covered.  Reviews how body is impacted by stress, anxiety, and depression.  Also discusses  healthy ways to reduce stress and to treat/manage anxiety and depression. Written material provided at class time.   Education: Sleep Hygiene -Provides group verbal and written instruction about how sleep can affect your health.  Define sleep hygiene, discuss sleep cycles and impact of sleep habits. Review good sleep hygiene tips.   Initial Review & Psychosocial Screening:  Initial Psych Review & Screening - 10/23/23 1310       Initial Review   Current issues with Current Stress Concerns      Family Dynamics   Good Support System? Yes      Barriers   Psychosocial barriers to participate in program There are no identifiable barriers or psychosocial  needs.;The patient should benefit from training in stress management and relaxation.      Screening Interventions   Interventions Encouraged to exercise;Provide feedback about the scores to participant;To provide support and resources with identified psychosocial needs    Expected Outcomes Short Term goal: Utilizing psychosocial counselor, staff and physician to assist with identification of specific Stressors or current issues interfering with healing process. Setting desired goal for each stressor or current issue identified.;Long Term Goal: Stressors or current issues are controlled or eliminated.;Short Term goal: Identification and review with participant of any Quality of Life or Depression concerns found by scoring the questionnaire.;Long Term goal: The participant improves quality of Life and PHQ9 Scores as seen by post scores and/or verbalization of changes          Quality of Life Scores:   Scores of 19 and below usually indicate a poorer quality of life in these areas.  A difference of  2-3 points is a clinically meaningful difference.  A difference of 2-3 points in the total score of the Quality of Life Index has been associated with significant improvement in overall quality of life, self-image, physical symptoms, and general health in studies assessing change in quality of life.  PHQ-9: Review Flowsheet  More data exists      11/14/2023 10/24/2023 12/06/2021 10/05/2021 01/28/2020  Depression screen PHQ 2/9  Decreased Interest 0 0 0 0 0  Down, Depressed, Hopeless 0 0 2 0 0  PHQ - 2 Score 0 0 2 0 0  Altered sleeping - 0 1 - -  Tired, decreased energy - 0 0 - -  Change in appetite - 0 0 - -  Feeling bad or failure about yourself  - 0 0 - -  Trouble concentrating - 0 0 - -  Moving slowly or fidgety/restless - 0 0 - -  Suicidal thoughts - 0 0 - -  PHQ-9 Score - 0 3 - -  Difficult doing work/chores - - Somewhat difficult - -   Interpretation of Total Score  Total Score Depression  Severity:  1-4 = Minimal depression, 5-9 = Mild depression, 10-14 = Moderate depression, 15-19 = Moderately severe depression, 20-27 = Severe depression   Psychosocial Evaluation and Intervention:  Psychosocial Evaluation - 10/23/23 1321       Psychosocial Evaluation & Interventions   Comments [name] is coming to cardiac rehab after a stent. 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 She also is being worked up for pulmonary issues and is scheduled for a CT and PFTs to help provide reasons as to why she feels short of breath. She states she feels like she is handling all her medical issues well, but admits it has been a lot  on her. She focuses on one day at a time and being thankful for what she has. She has a great support system. She suffers with insomnia and has for years now, so getting good rest if difficult for her.    Expected Outcomes Short: attend cardiac rehab for education and exercise Long: develop and maintain positive self care habits    Continue Psychosocial Services  Follow up required by staff          Psychosocial Re-Evaluation:  Psychosocial Re-Evaluation     Row Name 11/26/23 0940 03/12/24 0955           Psychosocial Re-Evaluation   Current issues with Current Stress Concerns Current Sleep Concerns      Comments [name] states that she isnt dealing with any stressors at this time. She does state that she deals insomnia, she was prescribed medication and that seems to be helping. [name] denies any stress, anxiety or depression at this time. She is dealing with insomnia and has been taking medication. Some night it helps some nights it does not.      Expected Outcomes Short: Continue to comply with insomnia medication. Long: Continue to manage stress and insomnia. STG: Continue to comply with insomnia medication. LTG: Continue to manage stress and insomnia.      Interventions Encouraged to attend  Cardiac Rehabilitation for the exercise Encouraged to attend Cardiac Rehabilitation for the exercise      Continue Psychosocial Services  Follow up required by staff Follow up required by staff         Psychosocial Discharge (Final Psychosocial Re-Evaluation):  Psychosocial Re-Evaluation - 03/12/24 0955       Psychosocial Re-Evaluation   Current issues with Current Sleep Concerns    Comments [name] denies any stress, anxiety or depression at this time. She is dealing with insomnia and has been taking medication. Some night it helps some nights it does not.    Expected Outcomes STG: Continue to comply with insomnia medication. LTG: Continue to manage stress and insomnia.    Interventions Encouraged to attend Cardiac Rehabilitation for the exercise    Continue Psychosocial Services  Follow up required by staff          Vocational Rehabilitation: Provide vocational rehab assistance to qualifying candidates.   Vocational Rehab Evaluation & Intervention:  Vocational Rehab - 10/23/23 1310       Initial Vocational Rehab Evaluation & Intervention   Assessment shows need for Vocational Rehabilitation No          Education: Education Goals: Education classes will be provided on a variety of topics geared toward better understanding of heart health and risk factor modification. Participant will state understanding/return demonstration of topics presented as noted by education test scores.  Learning Barriers/Preferences:  Learning Barriers/Preferences - 10/23/23 1310       Learning Barriers/Preferences   Learning Barriers None    Learning Preferences None          General Cardiac Education Topics:  AED/CPR: - Group verbal and written instruction with the use of models to demonstrate the basic use of the AED with the basic ABC's of resuscitation.   Test and Procedures: - Group verbal and visual presentation and models provide information about basic cardiac anatomy and  function. Reviews the testing methods done to diagnose heart disease and the outcomes of the test results. Describes the treatment choices: Medical Management, Angioplasty, or Coronary Bypass Surgery for treating various heart conditions including Myocardial Infarction, Angina, Valve Disease, and  Cardiac Arrhythmias. Written material provided at class time.   Medication Safety: - Group verbal and visual instruction to review commonly prescribed medications for heart and lung disease. Reviews the medication, class of the drug, and side effects. Includes the steps to properly store meds and maintain the prescription regimen. Written material provided at class time.   Intimacy: - Group verbal instruction through game format to discuss how heart and lung disease can affect sexual intimacy. Written material provided at class time.   Know Your Numbers and Heart Failure: - Group verbal and visual instruction to discuss disease risk factors for cardiac and pulmonary disease and treatment options.  Reviews associated critical values for Overweight/Obesity, Hypertension, Cholesterol, and Diabetes.  Discusses basics of heart failure: signs/symptoms and treatments.  Introduces Heart Failure Zone chart for action plan for heart failure. Written material provided at class time.   Infection Prevention: - Provides verbal and written material to individual with discussion of infection control including proper hand washing and proper equipment cleaning during exercise session. Flowsheet Row Cardiac Rehab from 10/24/2023 in [facility] Cardiac and Pulmonary Rehab  Date 10/24/23  Educator NT  Instruction Review Code 1- Verbalizes Understanding    Falls Prevention: - Provides verbal and written material to individual with discussion of falls prevention and safety. Flowsheet Row Cardiac Rehab from 10/24/2023 in [facility] Cardiac and Pulmonary Rehab  Date 10/24/23  Educator NT  Instruction Review Code 1- Verbalizes  Understanding    Other: -Provides group and verbal instruction on various topics (see comments)   Knowledge Questionnaire Score:   Core Components/Risk Factors/Patient Goals at Admission:  Personal Goals and Risk Factors at Admission - 10/23/23 1305       Core Components/Risk Factors/Patient Goals on Admission    Weight Management Yes;Weight Loss    Intervention Weight Management: Develop a combined nutrition and exercise program designed to reach desired caloric intake, while maintaining appropriate intake of nutrient and fiber, sodium and fats, and appropriate energy expenditure required for the weight goal.;Weight Management: Provide education and appropriate resources to help participant work on and attain dietary goals.;Weight Management/Obesity: Establish reasonable short term and long term weight goals.    Goal Weight: Long Term 140 lb (63.5 kg)    Expected Outcomes Short Term: Continue to assess and modify interventions until short term weight is achieved;Long Term: Adherence to nutrition and physical activity/exercise program aimed toward attainment of established weight goal;Weight Loss: Understanding of general recommendations for a balanced deficit meal plan, which promotes 1-2 lb weight loss per week and includes a negative energy balance of [phone] kcal/d;Understanding recommendations for meals to include 15-35% energy as protein, 25-35% energy from fat, 35-60% energy from carbohydrates, less than 200mg  of dietary cholesterol, 20-35 gm of total fiber daily;Understanding of distribution of calorie intake throughout the day with the consumption of 4-5 meals/snacks    Diabetes Yes    Intervention Provide education about signs/symptoms and action to take for hypo/hyperglycemia.;Provide education about proper nutrition, including hydration, and aerobic/resistive exercise prescription along with prescribed medications to achieve blood glucose in normal ranges: Fasting glucose 65-99 mg/dL     Expected Outcomes Short Term: Participant verbalizes understanding of the signs/symptoms and immediate care of hyper/hypoglycemia, proper foot care and importance of medication, aerobic/resistive exercise and nutrition plan for blood glucose control.;Long Term: Attainment of HbA1C < 7%.    Lipids Yes    Intervention Provide education and support for participant on nutrition & aerobic/resistive exercise along with prescribed medications to achieve LDL 70mg , HDL >40mg .  Expected Outcomes Short Term: Participant states understanding of desired cholesterol values and is compliant with medications prescribed. Participant is following exercise prescription and nutrition guidelines.;Long Term: Cholesterol controlled with medications as prescribed, with individualized exercise RX and with personalized nutrition plan. Value goals: LDL < 70mg , HDL > 40 mg.          Education:Diabetes - Individual verbal and written instruction to review signs/symptoms of diabetes, desired ranges of glucose level fasting, after meals and with exercise. Acknowledge that pre and post exercise glucose checks will be done for 3 sessions at entry of program. Flowsheet Row Cardiac Rehab from 10/24/2023 in [facility] Cardiac and Pulmonary Rehab  Date 10/23/23  Educator [facility]  Instruction Review Code 1- Verbalizes Understanding    Core Components/Risk Factors/Patient Goals Review:   Goals and Risk Factor Review     Row Name 11/26/23 0947 03/12/24 0957           Core Components/Risk Factors/Patient Goals Review   Personal Goals Review Weight Management/Obesity;Diabetes Weight Management/Obesity      Review [name] is still trying to lose about 10 pounds, she continues to diet and attend rehab in order to achieve her goals. She continues to check her blood sugar, but admits not everyday. Talked with patient about the importance of checking blood sugar everyday. [name] is working on losing weight. She is eating more colorful produce and  watching portions. She is also looking to do more exercise outside of rehab.      Expected Outcomes Short: Start checking blood sugar everyday. Long: Continue to exercise and manage diet in order to lose weight and keep blood sugar levels within a healthy range. STG: Follow a heart healthy diet and lose 5-10lbs. LTG: Manage risk factors independently         Core Components/Risk Factors/Patient Goals at Discharge (Final Review):   Goals and Risk Factor Review - 03/12/24 0957       Core Components/Risk Factors/Patient Goals Review   Personal Goals Review Weight Management/Obesity    Review [name] is working on losing weight. She is eating more colorful produce and watching portions. She is also looking to do more exercise outside of rehab.    Expected Outcomes STG: Follow a heart healthy diet and lose 5-10lbs. LTG: Manage risk factors independently          ITP Comments:  ITP Comments     Row Name 10/23/23 1302 10/24/23 1126 10/29/23 0954 11/21/23 0934 12/19/23 0814   ITP Comments Initial phone call completed. Diagnosis can be found in [facility] 4/8. EP Orientation scheduled for Wednesday 5/7 9:30. Completed and gym orientation for cardiac rehab. Initial ITP created and sent for review to Dr. Oneil Pinal, Medical Director. First full day of exercise!  Patient was oriented to gym and equipment including functions, settings, policies, and procedures.  Patient's individual exercise prescription and treatment plan were reviewed.  All starting workloads were established based on the results of the 6 minute walk test done at initial orientation visit.  The plan for exercise progression was also introduced and progression will be customized based on patient's performance and goals. 30 Day review completed. Medical Director ITP review done, changes made as directed, and signed approval by Medical Director.    new to program 30 Day review completed. Medical Director ITP review done, changes made as directed,  and signed approval by Medical Director.    Row Name 01/16/24 0758 02/13/24 0827 03/12/24 1026       ITP Comments 30  Day review completed. Medical Director ITP review done, changes made as directed, and signed approval by Medical Director. 30 Day review completed. Medical Director ITP review done, changes made as directed, and signed approval by Medical Director. 30 Day review completed. Medical Director ITP review done, changes made as directed, and signed approval by Medical Director.        Comments: 30 day review

## 2024-03-12 NOTE — Progress Notes (Signed)
 Daily Session Note  Patient Details  Name: Brandi Mahoney MRN: [id number] Date of Birth: 1955-08-23 Referring Provider:   Flowsheet Row Cardiac Rehab from 10/24/2023 in [facility] Cardiac and Pulmonary Rehab  Referring Provider Dr. Evalene Lunger, MD    Encounter Date: 03/12/2024  Check In:  Session Check In - 03/12/24 0921       Check-In   Supervising physician immediately available to respond to emergencies See telemetry face sheet for immediately available ER MD    Location [facility]-Cardiac & Pulmonary Rehab    Staff Present Burnard Davenport [facility] Peggi, RN, DNP, NE-BC;Maxon Conetta BS, Exercise Physiologist;Joseph Hood RCP,RRT,BSRT;Margaret Best, MS, Exercise Physiologist;Jason Elnor RDN,LDN    Virtual Visit No    Medication changes reported     No    Fall or balance concerns reported    No    Warm-up and Cool-down Performed on first and last piece of equipment    Resistance Training Performed Yes    VAD Patient? No    PAD/SET Patient? No      Pain Assessment   Currently in Pain? No/denies             Social History   Tobacco Use  Smoking Status Former   Current packs/day: 0.00   Average packs/day: 0.4 packs/day for 35.0 years (12.3 ttl pk-yrs)   Types: Cigarettes   Start date: 09/25/1973   Quit date: 09/25/2008   Years since quitting: 15.4  Smokeless Tobacco Never  Tobacco Comments   quit in 2007-2008, social smoker    Goals Met:  Independence with exercise equipment Exercise tolerated well No report of concerns or symptoms today Strength training completed today  Goals Unmet:  Not Applicable  Comments: Pt able to follow exercise prescription today without complaint.  Will continue to monitor for progression.    Dr. Oneil Pinal is Medical Director for [facility] Cardiac Rehabilitation.  Dr. Fuad Aleskerov is Medical Director for [facility] Pulmonary Rehabilitation.

## 2024-03-14 ENCOUNTER — Encounter

## 2024-03-14 ENCOUNTER — Telehealth: Payer: Self-pay | Admitting: *Deleted

## 2024-03-14 DIAGNOSIS — E039 Hypothyroidism, unspecified: Secondary | ICD-10-CM

## 2024-03-14 DIAGNOSIS — E782 Mixed hyperlipidemia: Secondary | ICD-10-CM

## 2024-03-14 DIAGNOSIS — Z79899 Other long term (current) drug therapy: Secondary | ICD-10-CM

## 2024-03-14 DIAGNOSIS — E559 Vitamin D deficiency, unspecified: Secondary | ICD-10-CM

## 2024-03-14 DIAGNOSIS — E119 Type 2 diabetes mellitus without complications: Secondary | ICD-10-CM

## 2024-03-14 NOTE — Telephone Encounter (Signed)
-----   Message from Brandi Mahoney sent at 03/14/2024  4:28 PM EDT ----- Regarding: Lab Wed 03/26/24 Hello,  Patient is coming in for CPE labs on Wednesday 03/26/24. Can we get orders please.   Thanks

## 2024-03-17 ENCOUNTER — Encounter

## 2024-03-17 DIAGNOSIS — Z955 Presence of coronary angioplasty implant and graft: Secondary | ICD-10-CM | POA: Diagnosis not present

## 2024-03-17 NOTE — Progress Notes (Signed)
 Daily Session Note  Patient Details  Name: VALEREE LEIDY MRN: [id number] Date of Birth: 03/22/1956 Referring Provider:   Flowsheet Row Cardiac Rehab from 10/24/2023 in [facility] Cardiac and Pulmonary Rehab  Referring Provider Dr. Evalene Lunger, MD    Encounter Date: 03/17/2024  Check In:  Session Check In - 03/17/24 0915       Check-In   Supervising physician immediately available to respond to emergencies See telemetry face sheet for immediately available ER MD    Location [facility]-Cardiac & Pulmonary Rehab    Staff Present Burnard Davenport [facility] Peggi, RN, DNP, NE-BC;Joseph Hood RCP,RRT,BSRT;Maxon Conetta BS, Exercise Physiologist;Naftali Carchi Dyane HECKLE, ACSM CEP, Exercise Physiologist    Virtual Visit No    Medication changes reported     No    Fall or balance concerns reported    No    Warm-up and Cool-down Performed on first and last piece of equipment    Resistance Training Performed Yes    VAD Patient? No    PAD/SET Patient? No      Pain Assessment   Currently in Pain? No/denies             Social History   Tobacco Use  Smoking Status Former   Current packs/day: 0.00   Average packs/day: 0.4 packs/day for 35.0 years (12.3 ttl pk-yrs)   Types: Cigarettes   Start date: 09/25/1973   Quit date: 09/25/2008   Years since quitting: 15.4  Smokeless Tobacco Never  Tobacco Comments   quit in 2007-2008, social smoker    Goals Met:  Proper associated with RPD/PD & O2 Sat Independence with exercise equipment Exercise tolerated well No report of concerns or symptoms today Strength training completed today  Goals Unmet:  Not Applicable  Comments: Pt able to follow exercise prescription today without complaint.  Will continue to monitor for progression.    Reviewed home exercise with pt today.  Pt plans to work out at [employer] for exercise.  Reviewed THR, pulse, RPE, sign and symptoms, pulse oximetery and when to call 911 or MD.  Also discussed weather considerations  and indoor options.  Pt voiced understanding.    Dr. Oneil Pinal is Medical Director for [facility] Cardiac Rehabilitation.  Dr. Fuad Aleskerov is Medical Director for [facility] Pulmonary Rehabilitation.

## 2024-03-19 ENCOUNTER — Encounter

## 2024-03-21 ENCOUNTER — Encounter

## 2024-03-24 ENCOUNTER — Encounter: Attending: Cardiovascular Disease

## 2024-03-24 ENCOUNTER — Encounter

## 2024-03-24 DIAGNOSIS — Z955 Presence of coronary angioplasty implant and graft: Secondary | ICD-10-CM | POA: Diagnosis present

## 2024-03-24 NOTE — Progress Notes (Signed)
 Daily Session Note  Patient Details  Name: MASHONDA BROSKI MRN: [id number] Date of Birth: 21-Dec-1955 Referring Provider:   Flowsheet Row Cardiac Rehab from 10/24/2023 in [facility] Cardiac and Pulmonary Rehab  Referring Provider Dr. Evalene Lunger, MD    Encounter Date: 03/24/2024  Check In:  Session Check In - 03/24/24 0923       Check-In   Supervising physician immediately available to respond to emergencies See telemetry face sheet for immediately available ER MD    Location [facility]-Cardiac & Pulmonary Rehab    Staff Present Burnard Davenport RN,BSN,MPA;Joseph Hood RCP,RRT,BSRT;Maxon Burnell BS, Exercise Physiologist;Whitt Auletta Dyane HECKLE, ACSM CEP, Exercise Physiologist    Virtual Visit No    Medication changes reported     No    Fall or balance concerns reported    No    Warm-up and Cool-down Performed on first and last piece of equipment    Resistance Training Performed Yes    VAD Patient? No    PAD/SET Patient? No      Pain Assessment   Currently in Pain? No/denies             Social History   Tobacco Use  Smoking Status Former   Current packs/day: 0.00   Average packs/day: 0.4 packs/day for 35.0 years (12.3 ttl pk-yrs)   Types: Cigarettes   Start date: 09/25/1973   Quit date: 09/25/2008   Years since quitting: 15.5  Smokeless Tobacco Never  Tobacco Comments   quit in 2007-2008, social smoker    Goals Met:  Proper associated with RPD/PD & O2 Sat Independence with exercise equipment Exercise tolerated well No report of concerns or symptoms today Strength training completed today  Goals Unmet:  Not Applicable  Comments: Pt able to follow exercise prescription today without complaint.  Will continue to monitor for progression.    Dr. Oneil Pinal is Medical Director for [facility] Cardiac Rehabilitation.  Dr. Fuad Aleskerov is Medical Director for [facility] Pulmonary Rehabilitation.

## 2024-03-26 ENCOUNTER — Other Ambulatory Visit

## 2024-03-26 ENCOUNTER — Encounter

## 2024-03-26 DIAGNOSIS — Z955 Presence of coronary angioplasty implant and graft: Secondary | ICD-10-CM

## 2024-03-26 NOTE — Progress Notes (Signed)
 Daily Session Note  Patient Details  Name: Brandi Mahoney MRN: [id number] Date of Birth: 08/13/1955 Referring Provider:   Flowsheet Row Cardiac Rehab from 10/24/2023 in [facility] Cardiac and Pulmonary Rehab  Referring Provider Dr. Evalene Lunger, MD    Encounter Date: 03/26/2024  Check In:  Session Check In - 03/26/24 0916       Check-In   Supervising physician immediately available to respond to emergencies See telemetry face sheet for immediately available ER MD    Location [facility]-Cardiac & Pulmonary Rehab    Staff Present Burnard Davenport [facility] Peggi, RN, DNP, NE-BC;Joseph [facility] RN,BSN;Margaret Best, MS, Exercise Physiologist    Virtual Visit No    Medication changes reported     No    Fall or balance concerns reported    No    Warm-up and Cool-down Performed on first and last piece of equipment    Resistance Training Performed Yes    VAD Patient? No    PAD/SET Patient? No      Pain Assessment   Currently in Pain? No/denies             Social History   Tobacco Use  Smoking Status Former   Current packs/day: 0.00   Average packs/day: 0.4 packs/day for 35.0 years (12.3 ttl pk-yrs)   Types: Cigarettes   Start date: 09/25/1973   Quit date: 09/25/2008   Years since quitting: 15.5  Smokeless Tobacco Never  Tobacco Comments   quit in 2007-2008, social smoker    Goals Met:  Proper associated with RPD/PD & O2 Sat Independence with exercise equipment Exercise tolerated well Personal goals reviewed No report of concerns or symptoms today Strength training completed today  Goals Unmet:  Not Applicable  Comments: Pt able to follow exercise prescription today without complaint.  Will continue to monitor for progression.    Dr. Oneil Pinal is Medical Director for [facility] Cardiac Rehabilitation.  Dr. Fuad Aleskerov is Medical Director for [facility] Pulmonary Rehabilitation.

## 2024-03-28 ENCOUNTER — Encounter

## 2024-03-30 NOTE — Progress Notes (Unsigned)
 Johnn Krasowski T. Philisha Weinel, MD, CAQ Sports Medicine [facility] [address] [place], [address]  Phone: [phone]  FAX: [phone]  Brandi Mahoney - 68 y.o. female  MRN [id number]  Date of Birth: [birth year]  Date: 04/02/2024  PCP: Watt Mirza, MD  Referral: Watt Mirza, MD  No chief complaint on file.  Patient Care Team: Watt Mirza, MD as PCP - General Gollan, Evalene PARAS, MD as PCP - Cardiology (Cardiology) Dessa Reyes ORN, MD (General Surgery) Fleeta Milks, Lamar ORN, MD (Unknown Physician Specialty) Subjective:   Brandi Mahoney is a 68 y.o. pleasant patient who presents for a medicare wellness examination:  Health Maintenance Summary Reviewed and updated, unless pt declines services.  Tobacco History Reviewed. Non-smoker Alcohol: No concerns, no excessive use Exercise Habits: Some activity, rec at least 30 mins 5 times a week STD concerns: none Drug Use: None Birth control method: n/a Menses regular: n/a Lumps or breast concerns: no Breast Cancer Family History: no  Discussed the use of AI scribe software for clinical note transcription with the patient, who gave verbal consent to proceed.  She is a very pleasant lady who presents with some general physical, but she also has multiple medical problems.  Follow-up type 2 diabetes, coronary disease, hyperlipidemia, hypothyroidism  Diabetes Mellitus: Tolerating Medications: yes Compliance with diet: fair, There is no height or weight on file to calculate BMI. Exercise: minimal / intermittent Avg blood sugars at home: not checking Foot problems: none Hypoglycemia: none No nausea, vomitting, blurred vision, polyuria.  Lab Results  Component Value Date   HGBA1C 6.4 (A) 08/06/2023   HGBA1C 7.8 (H) 11/22/2022   HGBA1C 8.3 (A) 08/23/2022   Lab Results  Component Value Date   LDLCALC 21 11/30/2023   CREATININE 0.61 12/11/2023    Wt Readings from Last 3  Encounters:  01/17/24 158 lb (71.7 kg)  12/28/23 159 lb (72.1 kg)  12/11/23 157 lb (71.2 kg)    HTN: Tolerating all medications without side effects Stable and at goal No CP, no sob. No HA.  BP Readings from Last 3 Encounters:  01/17/24 128/74  12/28/23 126/72  12/11/23 (!) 147/65    Basic Metabolic Panel:    Component Value Date/Time   NA 137 12/11/2023 1939   NA 142 11/30/2023 0903   NA 139 08/05/2013 1739   K 3.7 12/11/2023 1939   K 3.7 08/05/2013 1739   CL 103 12/11/2023 1939   CL 108 (H) 08/05/2013 1739   CO2 23 12/11/2023 1939   CO2 24 08/05/2013 1739   BUN 16 12/11/2023 1939   BUN 12 11/30/2023 0903   BUN 15 08/05/2013 1739   CREATININE 0.61 12/11/2023 1939   CREATININE 0.63 08/05/2013 1739   GLUCOSE 147 (H) 12/11/2023 1939   GLUCOSE 108 (H) 08/05/2013 1739   CALCIUM  9.5 12/11/2023 1939   CALCIUM  8.6 08/05/2013 1739    Lipids: Doing well, stable. Tolerating meds fine with no SE. Panel reviewed with patient.  Lipids: Lab Results  Component Value Date   CHOL 80 (L) 11/30/2023   Lab Results  Component Value Date   HDL 36 (L) 11/30/2023   Lab Results  Component Value Date   LDLCALC 21 11/30/2023   Lab Results  Component Value Date   TRIG 130 11/30/2023   Lab Results  Component Value Date   CHOLHDL 2.2 11/30/2023    Lab Results  Component Value Date   ALT 18 11/30/2023   AST  23 11/30/2023   ALKPHOS 66 11/30/2023   BILITOT 0.3 11/30/2023    Urine microalbumin DEXA scan Foot exam COVID booster? History of Present Illness     Health Maintenance  Topic Date Due   Diabetic kidney evaluation - Urine ACR  Never done   DEXA SCAN  Never done   FOOT EXAM  11/30/2023   Medicare Annual Wellness (AWV)  11/30/2023   HEMOGLOBIN A1C  02/03/2024   COVID-19 Vaccine (8 - 2025-26 season) 02/18/2024   Influenza Vaccine  09/16/2024 (Originally 01/18/2024)   OPHTHALMOLOGY EXAM  11/06/2024   Diabetic kidney evaluation - eGFR measurement  12/10/2024    Mammogram  02/19/2025   Colonoscopy  12/25/2027   DTaP/Tdap/Td (3 - Td or Tdap) 12/02/2032   Pneumococcal Vaccine: 50+ Years  Completed   Hepatitis C Screening  Completed   Zoster Vaccines- Shingrix  Completed   Meningococcal B Vaccine  Aged Out   Immunization History  Administered Date(s) Administered    sv, Bivalent, Protein Subunit Rsvpref,pf (Abrysvo) 02/16/2022   INFLUENZA, HIGH DOSE SEASONAL PF 02/16/2022, 07/17/2023   Influenza Split 03/19/2013   Influenza,inj,Quad PF,6+ Mos 04/20/2017, 04/04/2018, 04/07/2019   Influenza-Unspecified 03/20/2015   Moderna Sars-Covid-2 Vaccination 08/13/2019, 09/12/2019   PFIZER(Purple Top)SARS-COV-2 Vaccination 04/13/2020, 11/20/2020   PNEUMOCOCCAL CONJUGATE-20 10/05/2021, 12/03/2022   Pfizer Covid-19 Vaccine Bivalent Booster 31yrs & up 04/19/2021, 12/06/2021   Pfizer(Comirnaty)Fall Seasonal Vaccine 12 years and older 07/17/2023   Pneumococcal Polysaccharide-23 03/02/2016   Td 05/24/2009   Tdap 12/03/2022   Zoster Recombinant(Shingrix) 08/16/2018, 01/10/2019    Patient Active Problem List   Diagnosis Date Noted   S/P coronary artery stent placement 09/25/2023    Priority: High   Coronary artery disease involving native coronary artery of native heart with angina pectoris     Priority: High   Type 2 Diabetes     Priority: High   Mixed hyperlipidemia 04/01/2010    Priority: High   HYPERTRIGLYCERIDEMIA 07/21/2009    Priority: High   Hypothyroidism in adult 09/08/2008    Priority: High   Obstructive sleep apnea 09/25/2013    Priority: Medium    Tobacco abuse, in remission 09/19/2013    Priority: Medium    Major depressive disorder, recurrent episode, in full remission 09/08/2008    Priority: Medium    Pseudoaneurysm following procedure 09/27/2023   Lichen sclerosus 03/13/2017   GAD (generalized anxiety disorder)    IBS (irritable bowel syndrome) 09/26/2012   Viral myocarditis, 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 01/2010 Cath: mild atherosclerosis in the mid LCX after takeoff of OM1, mild diff LAD dzs;  b. 07/2013 MV: EF 67%, no ischemia.   GAD (generalized anxiety disorder)    History of kidney stones    HLD (hyperlipidemia)    Hypothyroidism    Major depressive disorder, recurrent episode, in full remission 09/08/2008   Qualifier: Diagnosis of  By: Safiyya Stokes MD, Chadric Kimberley     Migraine headache    Myocarditis (HCC)    a. 01/2010 presented w/ c/p and elev trop-->Cath: mild atherosclerosis in the mid LCX after takeoff of OM1, mild diff LAD dzs;  b. 03/2010 Echo: EF 60-65%, DD, mild LVH;  c. 07/2011 Echo: EF 55-60%, mild LVH, no rwma.   Sleep apnea    has c-pap.  wears off and on - not lately  using  per reported 07-29-2020   Type 2 diabetes, diet controlled (  [facility])     Past Surgical History:  Procedure Laterality Date   APPENDECTOMY  1974   BREAST SURGERY Right 1988   fatty tumore from breast   CARDIAC CATHETERIZATION  01/2010   [facility]   CHOLECYSTECTOMY  2002   COLONOSCOPY  2017   CORONARY STENT INTERVENTION N/A 09/25/2023   Procedure: CORONARY STENT INTERVENTION;  Surgeon: Swaziland, Peter M, MD;  Location: [facility] INVASIVE CV LAB;  Service: Cardiovascular;  Laterality: N/A;   CYST EXCISION     ovarian cyst 1994   INSERTION OF ARTERIOVENOUS (AV) ARTEGRAFT ARM Right 09/27/2023   Procedure: 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;  Surgeon: Pearline Norman RAMAN, MD;  Location: [facility] OR;  Service: Vascular;  Laterality: Right;   LEFT HEART CATH AND CORONARY ANGIOGRAPHY N/A 09/25/2023   Procedure: LEFT HEART CATH AND CORONARY ANGIOGRAPHY;  Surgeon: Swaziland, Peter M, MD;  Location: [facility] INVASIVE CV LAB;  Service: Cardiovascular;  Laterality: N/A;   NEPHROLITHOTOMY Right 08/20/2014   Procedure: RIGHT PERCUTANEOUS NEPHROLITHOTOMY ;  Surgeon: Norleen JINNY Seltzer, MD;  Location: [facility] ORS;  Service: Urology;  Laterality: Right;    POLYPECTOMY     RECTAL EXAM UNDER ANESTHESIA N/A 08/02/2020   Procedure: DONNY EXAM UNDER ANESTHESIA INJECTION OF PERIANAL BOTOX ;  Surgeon: Teresa Lonni HERO, MD;  Location: [facility];  Service: General;  Laterality: N/A;   TOTAL ABDOMINAL HYSTERECTOMY W/ BILATERAL SALPINGOOPHORECTOMY  2000   no ovaries, uterus, or cervix.    Family History  Problem Relation Age of Onset   Coronary artery disease Mother    Diabetes Mother    ALS Mother    Hyperlipidemia Father    Breast cancer Sister 77   Stomach cancer Maternal Grandfather    Colon cancer Maternal Grandfather        dx early 49's    Breast cancer Paternal Grandmother    Lung cancer Paternal Grandfather        CAD   Other Daughter        gluten   Colon cancer Son 5       3rd stage   Down syndrome Son    Esophageal cancer Neg Hx    Liver cancer Neg Hx    Pancreatic cancer Neg Hx    Rectal cancer Neg Hx     Social History   Social History Narrative   Lives in [place] w/ husband.  No reg exercise.    Past Medical History, Surgical History, Social History, Family History, Problem List, Medications, and Allergies have been reviewed and updated if relevant.  Review of Systems: Pertinent positives are listed above.  Otherwise, a full 14 point review of systems has been done in full and it is negative except where it is noted positive.  Objective:   There were no vitals taken for this visit.    07/31/2022   10:28 AM 09/20/2023   11:23 AM 10/22/2023    1:45 PM 10/23/2023    1:06 PM 11/14/2023   11:53 AM  Fall Risk  Falls in the past year? 0 0 0 0 0  Was there an injury with Fall? 0 0  0 0  Fall Risk Category Calculator 0 0  0 0  Patient at Risk for Falls Due to No Fall Risks No Fall Risks   No Fall Risks  Fall risk Follow up Falls evaluation completed Falls evaluation completed   Falls evaluation completed   Ideal Body Weight:   No results found.  11/14/2023   11:54 AM 10/24/2023   11:42 AM  12/06/2021    9:21 AM 10/05/2021   10:55 AM 01/28/2020   11:33 AM  Depression screen PHQ 2/9  Decreased Interest 0 0 0 0 0  Down, Depressed, Hopeless 0 0 2 0 0  PHQ - 2 Score 0 0 2 0 0  Altered sleeping  0 1    Tired, decreased energy  0 0    Change in appetite  0 0    Feeling bad or failure about yourself   0 0    Trouble concentrating  0 0    Moving slowly or fidgety/restless  0 0    Suicidal thoughts  0 0    PHQ-9 Score  0 3    Difficult doing work/chores   Somewhat difficult       GEN: well developed, well nourished, no acute distress Eyes: conjunctiva and lids normal, PERRLA, EOMI ENT: TM clear, nares clear, oral exam WNL Neck: supple, no lymphadenopathy, no thyromegaly, no JVD Pulm: clear to auscultation and percussion, respiratory effort normal CV: regular rate and rhythm, S1-S2, no murmur, rub or gallop, no bruits Chest: no scars, masses, no lumps BREAST: no lumps, no axillary LAD, no nipple discharge GI: soft, non-tender; no hepatosplenomegaly, masses; active bowel sounds all quadrants GU: Normal external female genitalia. Cervix appears intact without lesions or irritation. Vaginal canal normal without ulceration or lesion. Cervix NT to exam. Ovaries neither enlarged nor tender. (Chaperoned examination by female staff) Lymph: no cervical, axillary or inguinal adenopathy MSK: gait normal, muscle tone and strength WNL, no joint swelling, effusions, discoloration, crepitus  SKIN: clear, good turgor, color WNL, no rashes, lesions, or ulcerations Neuro: normal mental status, normal strength, sensation, and motion Psych: alert; oriented to person, place and time, normally interactive and not anxious or depressed in appearance.  All labs reviewed with patient.   No results found.  Assessment and Plan:     ICD-10-CM   1. Healthcare maintenance  Z00.00       Assessment and Plan Assessment & Plan      Health Maintenance Exam: The patient's preventative maintenance  and recommended screening tests for an annual wellness exam were reviewed in full today. Brought up to date unless services declined.  Counselled on the importance of diet, exercise, and its role in overall health and mortality. The patient's FH and SH was reviewed, including their home life, tobacco status, and drug and alcohol status.  Follow-up in 1 year for physical exam or additional follow-up below.  I have personally reviewed the Medicare Annual Wellness questionnaire and have noted 1. The patient's medical and social history 2. Their use of alcohol, tobacco or illicit drugs 3. Their current medications and supplements 4. The patient's functional ability including ADL's, fall risks, home safety risks and hearing or visual             impairment. 5. Diet and physical activities 6. Evidence for depression or mood disorders 7. Reviewed Updated provider list, see scanned forms and [facility] Snapshot.  8. Reviewed whether or not the patient has HCPOA or living will, and discussed what this means with the patient.  Recommended she bring in a copy for his chart in [facility].  The patients weight, height, BMI and visual acuity have been recorded in the chart I have made referrals, counseling and provided education to the patient based review of the above and I have provided the pt with a written personalized care plan for  preventive services.  I have provided the patient with a copy of your personalized plan for preventive services. Instructed to take the time to review along with their updated medication list.  Disposition: No follow-ups on file.  Future Appointments  Date Time Provider Department Center  03/31/2024  9:15 AM [facility]ARY REHAB PROGRAM SESSION [facility] None  03/31/2024  1:40 PM [facility] ANNUAL WELLNESS VISIT 1 [facility] [address]  04/02/2024  7:30 AM [facility] PRO[facility] SESSION [facility] None  04/02/2024  9:40 AM Malan Werk, MD [facility] [address]   04/04/2024  9:15 AM [facility] PRO[facility] SESSION [facility] None  04/07/2024  9:15 AM [facility] SESSION [facility] None  04/09/2024  9:15 AM [facility] SESSION [facility] None  04/11/2024  9:15 AM [facility] S[facility] None  04/14/2024  9:15 AM [facility] SESSION [facility] None  04/16/2024  9:15 AM [facility] SESSION [facility] None  04/18/2024  9:15 AM [facility] None  01/01/2025 10:00 AM [facility] [place] [facility] 1 [facility]    No orders of the defined types were placed in this encounter.  There are no discontinued medications. No orders of the defined types were placed in this encounter.   Signed,  Jacques DASEN. Ramia Sidney, MD   Allergies as of 04/02/2024       Reactions   Cat Dander Shortness Of Breath   Pseudoeph-doxylamine-dm-apap Anaphylaxis   Allergy to nyquil   Gluten Other (See Comments)   Gi upset   Penicillins Itching, Rash        Medication List        Accurate as of March 30, 2024  9:31 AM. If you have any questions, ask your nurse or doctor.          Accu-Chek Guide test strip Generic drug: glucose blood Use to check blood sugar daily   albuterol  108 (90 Base) MCG/ACT inhaler Commonly known as: VENTOLIN  HFA Inhale 2 puffs into the lungs every 6 (six) hours as needed for wheezing or shortness of breath.   amLODipine  2.5 MG tablet Commonly known as: NORVASC  TAKE 1 TABLET BY MOUTH DAILY   aspirin  EC 81 MG tablet Take 81 mg by mouth daily.   clobetasol  ointment 0.05 % Commonly known as: TEMOVATE  Apply 1 Application topically daily as needed (skin conditions).   CULTURELLE ADULT ULT BALANCE PO Take 1 tablet by mouth daily.   fenofibrate  160 MG tablet TAKE 1 TABLET BY MOUTH DAILY   fluticasone 50 MCG/ACT nasal spray Commonly known as:  FLONASE Place 2 sprays into both nostrils in the morning and at bedtime.   levothyroxine  75 MCG tablet Commonly known as: SYNTHROID  TAKE ONE TABLET BY MOUTH EVERY MORNING BEFORE BREAKFAST   Magnesium  250 MG Tabs Take 250 mg by mouth daily.   metFORMIN  500 MG 24 hr tablet Commonly known as: GLUCOPHAGE -XR Take 1,000 mg by mouth 2 (two) times daily with a meal.   nitroGLYCERIN  0.4 MG SL tablet Commonly known as: NITROSTAT  Place 1 tablet under tongue at onset of chest pain; you may repeat every 5 minutes for up to 3 doses.   ONE-A-DAY WOMENS PETITES PO Take 1 tablet by mouth daily.   pantoprazole  40 MG tablet Commonly known as: PROTONIX  Take 1 tablet (40 mg total) by mouth daily.   prasugrel  10 MG Tabs tablet Commonly known as: EFFIENT  Take 1 tablet (10 mg total) by mouth daily.   rosuvastatin  40 MG tablet Commonly known as: Crestor  Take 1 tablet (  40 mg total) by mouth daily.   sertraline  50 MG tablet Commonly known as: ZOLOFT  TAKE 1 TABLET BY MOUTH DAILY   traMADol 50 MG tablet Commonly known as: ULTRAM Take 50 mg by mouth 2 (two) times daily as needed.   traZODone  100 MG tablet Commonly known as: DESYREL  TAKE 1 TABLET BY MOUTH AT BEDTIME

## 2024-03-31 ENCOUNTER — Ambulatory Visit (INDEPENDENT_AMBULATORY_CARE_PROVIDER_SITE_OTHER)

## 2024-03-31 ENCOUNTER — Other Ambulatory Visit (INDEPENDENT_AMBULATORY_CARE_PROVIDER_SITE_OTHER)

## 2024-03-31 ENCOUNTER — Encounter

## 2024-03-31 VITALS — Ht 64.5 in | Wt 156.8 lb

## 2024-03-31 VITALS — BP 128/74 | Ht 64.0 in | Wt 156.0 lb

## 2024-03-31 DIAGNOSIS — Z955 Presence of coronary angioplasty implant and graft: Secondary | ICD-10-CM | POA: Diagnosis not present

## 2024-03-31 DIAGNOSIS — Z0000 Encounter for general adult medical examination without abnormal findings: Secondary | ICD-10-CM | POA: Diagnosis not present

## 2024-03-31 DIAGNOSIS — E119 Type 2 diabetes mellitus without complications: Secondary | ICD-10-CM | POA: Diagnosis not present

## 2024-03-31 DIAGNOSIS — E559 Vitamin D deficiency, unspecified: Secondary | ICD-10-CM | POA: Diagnosis not present

## 2024-03-31 DIAGNOSIS — E782 Mixed hyperlipidemia: Secondary | ICD-10-CM

## 2024-03-31 DIAGNOSIS — Z79899 Other long term (current) drug therapy: Secondary | ICD-10-CM

## 2024-03-31 DIAGNOSIS — E039 Hypothyroidism, unspecified: Secondary | ICD-10-CM

## 2024-03-31 LAB — LIPID PANEL
Cholesterol: 94 mg/dL (ref 0–200)
HDL: 43.3 mg/dL (ref >39.00)
LDL Cholesterol: 20 mg/dL (ref 0–99)
NonHDL: 50.62
Total CHOL/HDL Ratio: 2
Triglycerides: 152 mg/dL — ABNORMAL HIGH (ref 0.0–149.0)
VLDL: 30.4 mg/dL (ref 0.0–40.0)

## 2024-03-31 LAB — T4, FREE: Free T4: 0.84 ng/dL (ref 0.60–1.60)

## 2024-03-31 LAB — CBC WITH DIFFERENTIAL/PLATELET
Basophils Absolute: 0 K/uL (ref 0.0–0.1)
Basophils Relative: 0.5 % (ref 0.0–3.0)
Eosinophils Absolute: 0.1 K/uL (ref 0.0–0.7)
Eosinophils Relative: 2.1 % (ref 0.0–5.0)
HCT: 37.5 % (ref 36.0–46.0)
Hemoglobin: 11.8 g/dL — ABNORMAL LOW (ref 12.0–15.0)
Lymphocytes Relative: 30.6 % (ref 12.0–46.0)
Lymphs Abs: 2.1 K/uL (ref 0.7–4.0)
MCHC: 31.5 g/dL (ref 30.0–36.0)
MCV: 82.3 fl (ref 78.0–100.0)
Monocytes Absolute: 0.5 K/uL (ref 0.1–1.0)
Monocytes Relative: 7.1 % (ref 3.0–12.0)
Neutro Abs: 4.1 K/uL (ref 1.4–7.7)
Neutrophils Relative %: 59.7 % (ref 43.0–77.0)
Platelets: 270 K/uL (ref 150.0–400.0)
RBC: 4.55 Mil/uL (ref 3.87–5.11)
RDW: 13.7 % (ref 11.5–15.5)
WBC: 6.9 K/uL (ref 4.0–10.5)

## 2024-03-31 LAB — HEPATIC FUNCTION PANEL
ALT: 18 U/L (ref 0–35)
AST: 21 U/L (ref 0–37)
Albumin: 4.7 g/dL (ref 3.5–5.2)
Alkaline Phosphatase: 58 U/L (ref 39–117)
Bilirubin, Direct: 0.1 mg/dL (ref 0.0–0.3)
Total Bilirubin: 0.3 mg/dL (ref 0.2–1.2)
Total Protein: 6.6 g/dL (ref 6.0–8.3)

## 2024-03-31 LAB — BASIC METABOLIC PANEL WITH GFR
BUN: 17 mg/dL (ref 6–23)
CO2: 30 meq/L (ref 19–32)
Calcium: 9.3 mg/dL (ref 8.4–10.5)
Chloride: 104 meq/L (ref 96–112)
Creatinine, Ser: 0.7 mg/dL (ref 0.40–1.20)
GFR: 89.22 mL/min (ref >60.00)
Glucose, Bld: 129 mg/dL — ABNORMAL HIGH (ref 70–99)
Potassium: 4.1 meq/L (ref 3.5–5.1)
Sodium: 141 meq/L (ref 135–145)

## 2024-03-31 LAB — T3, FREE: T3, Free: 3.4 pg/mL (ref 2.3–4.2)

## 2024-03-31 LAB — HEMOGLOBIN A1C: Hgb A1c MFr Bld: 7 % — ABNORMAL HIGH (ref 4.6–6.5)

## 2024-03-31 LAB — TSH: TSH: 4.09 u[IU]/mL (ref 0.35–5.50)

## 2024-03-31 LAB — MICROALBUMIN / CREATININE URINE RATIO
Creatinine,U: 58 mg/dL
Microalb Creat Ratio: 23.6 mg/g (ref 0.0–30.0)
Microalb, Ur: 1.4 mg/dL (ref 0.0–1.9)

## 2024-03-31 LAB — VITAMIN D 25 HYDROXY (VIT D DEFICIENCY, FRACTURES): VITD: 32.97 ng/mL (ref 30.00–100.00)

## 2024-03-31 NOTE — Progress Notes (Signed)
 Daily Session Note  Patient Details  Name: RANATA LAUGHERY MRN: [id number] Date of Birth: 1955/10/15 Referring Provider:   Flowsheet Row Cardiac Rehab from 10/24/2023 in [facility] Cardiac and Pulmonary Rehab  Referring Provider Dr. Evalene Lunger, MD    Encounter Date: 03/31/2024  Check In:  Session Check In - 03/31/24 0922       Check-In   Supervising physician immediately available to respond to emergencies See telemetry face sheet for immediately available ER MD    Location [facility]-Cardiac & Pulmonary Rehab    Staff Present Burnard Davenport RN,BSN,MPA;Joseph Hood RCP,RRT,BSRT;Maxon Burnell BS, Exercise Physiologist;Raphel Stickles Dyane HECKLE, ACSM CEP, Exercise Physiologist    Virtual Visit No    Medication changes reported     No    Fall or balance concerns reported    No    Warm-up and Cool-down Performed on first and last piece of equipment    Resistance Training Performed Yes    VAD Patient? No    PAD/SET Patient? No      Pain Assessment   Currently in Pain? No/denies             Social History   Tobacco Use  Smoking Status Former   Current packs/day: 0.00   Average packs/day: 0.4 packs/day for 35.0 years (12.3 ttl pk-yrs)   Types: Cigarettes   Start date: 09/25/1973   Quit date: 09/25/2008   Years since quitting: 15.5  Smokeless Tobacco Never  Tobacco Comments   quit in 2007-2008, social smoker    Goals Met:  Proper associated with RPD/PD & O2 Sat Independence with exercise equipment Exercise tolerated well No report of concerns or symptoms today Strength training completed today  Goals Unmet:  Not Applicable  Comments: Pt able to follow exercise prescription today without complaint.  Will continue to monitor for progression.    Dr. Oneil Pinal is Medical Director for [facility] Cardiac Rehabilitation.  Dr. Fuad Aleskerov is Medical Director for [facility] Pulmonary Rehabilitation.

## 2024-03-31 NOTE — Patient Instructions (Signed)
 Brandi Mahoney,  Thank you for taking the time for your Medicare Wellness Visit. I appreciate your continued commitment to your health goals. Please review the care plan we discussed, and feel free to reach out if I can assist you further.  Medicare recommends these wellness visits once per year to help you and your care team stay ahead of potential health issues. These visits are designed to focus on prevention, allowing your provider to concentrate on managing your acute and chronic conditions during your regular appointments.  Please note that Annual Wellness Visits do not include a physical exam. Some assessments may be limited, especially if the visit was conducted virtually. If needed, we may recommend a separate in-person follow-up with your provider.  Ongoing Care Seeing your primary care provider every 3 to 6 months helps us  monitor your health and provide consistent, personalized care.   Referrals If a referral was made during today's visit and you haven't received any updates within two weeks, please contact the referred provider directly to check on the status.  Recommended Screenings:  Health Maintenance  Topic Date Due   Yearly kidney health urinalysis for diabetes  Never done   DEXA scan (bone density measurement)  Never done   Complete foot exam   11/30/2023   Hemoglobin A1C  02/03/2024   COVID-19 Vaccine (8 - 2025-26 season) 02/18/2024   Flu Shot  09/16/2024*   Eye exam for diabetics  11/06/2024   Yearly kidney function blood test for diabetes  12/10/2024   Breast Cancer Screening  02/19/2025   Medicare Annual Wellness Visit  03/31/2025   Colon Cancer Screening  12/25/2027   DTaP/Tdap/Td vaccine (3 - Td or Tdap) 12/02/2032   Pneumococcal Vaccine for age over 81  Completed   Hepatitis C Screening  Completed   Zoster (Shingles) Vaccine  Completed   Meningitis B Vaccine  Aged Out  *Topic was postponed. The date shown is not the original due date.       03/31/2024     1:50 PM  Advanced Directives  Does Patient Have a Medical Advance Directive? Yes  Type of Estate agent of [place];Living will  Does patient want to make changes to medical advance directive? No - Patient declined  Copy of Healthcare Power of Attorney in Chart? Yes - validated most recent copy scanned in chart (See row information)   Advance Care Planning is important because it: Ensures you receive medical care that aligns with your values, goals, and preferences. Provides guidance to your family and loved ones, reducing the emotional burden of decision-making during critical moments.  Vision: Annual vision screenings are recommended for early detection of glaucoma, cataracts, and diabetic retinopathy. These exams can also reveal signs of chronic conditions such as diabetes and high blood pressure.  Dental: Annual dental screenings help detect early signs of oral cancer, gum disease, and other conditions linked to overall health, including heart disease and diabetes.  Please see the attached documents for additional preventive care recommendations.

## 2024-03-31 NOTE — Progress Notes (Signed)
 Because this visit was a virtual/telehealth visit,  certain criteria was not obtained, such a blood pressure, CBG if applicable, and timed get up and go. Any medications not marked as taking were not mentioned during the medication reconciliation part of the visit. Any vitals not documented were not able to be obtained due to this being a telehealth visit or patient was unable to self-report a recent blood pressure reading due to a lack of equipment at home via telehealth. Vitals that have been documented are verbally provided by the patient.  This visit was performed by a medical professional under my direct supervision. I was immediately available for consultation/collaboration. I have reviewed and agree with the Annual Wellness Visit documentation.  Subjective:   Brandi Mahoney is a 68 y.o. who presents for a Medicare Wellness preventive visit.  As a reminder, Annual Wellness Visits don't include a physical exam, and some assessments may be limited, especially if this visit is performed virtually. We may recommend an in-person follow-up visit with your provider if needed.  Visit Complete: Virtual I connected with  Brandi Mahoney on 03/31/24 by a video and audio enabled telemedicine application and verified that I am speaking with the correct person using two identifiers.  Patient Location: Home  Provider Location: Home Office  I discussed the limitations of evaluation and management by telemedicine. The patient expressed understanding and agreed to proceed.  Vital Signs: Because this visit was a virtual/telehealth visit, some criteria may be missing or patient reported. Any vitals not documented were not able to be obtained and vitals that have been documented are patient reported.  Persons Participating in Visit: Patient.  AWV Questionnaire: No: Patient Medicare AWV questionnaire was not completed prior to this visit.  Cardiac Risk Factors include: advanced age (>65men, >66  women);diabetes mellitus;hypertension     Objective:    Today's Vitals   03/31/24 1351  BP: 128/74  Weight: 156 lb (70.8 kg)  Height: 5' 4 (1.626 m)   Body mass index is 26.78 kg/m.     03/31/2024    1:50 PM 12/11/2023    7:36 PM 10/23/2023    1:24 PM 10/05/2023    6:29 PM 09/27/2023    9:01 AM 09/26/2023    2:00 PM 09/25/2023   10:27 AM  Advanced Directives  Does Patient Have a Medical Advance Directive? Yes Yes Yes Yes Yes Yes Yes  Type of Estate agent of [place];Living will Healthcare Power of [place];Living will Healthcare Power of [place];Living will Healthcare Power of [place];Living will Healthcare Power of [place];Living will Healthcare Power of [place];Living will Healthcare Power of [place];Living will  Does patient want to make changes to medical advance directive? No - Patient declined  No - Patient declined No - Patient declined  No - Patient declined   Copy of Healthcare Power of Attorney in Chart? Yes - validated most recent copy scanned in chart (See row information)    No - copy requested No - copy requested     Current Medications (verified) Outpatient Encounter Medications as of 03/31/2024  Medication Sig   albuterol  (VENTOLIN  HFA) 108 (90 Base) MCG/ACT inhaler Inhale 2 puffs into the lungs every 6 (six) hours as needed for wheezing or shortness of breath.   amLODipine  (NORVASC ) 2.5 MG tablet TAKE 1 TABLET BY MOUTH DAILY   aspirin  EC 81 MG tablet Take 81 mg by mouth daily.   clobetasol  ointment (TEMOVATE ) 0.05 % Apply 1 Application topically daily as needed (skin conditions).  fenofibrate  160 MG tablet TAKE 1 TABLET BY MOUTH DAILY   fluticasone (FLONASE) 50 MCG/ACT nasal spray Place 2 sprays into both nostrils in the morning and at bedtime.   glucose blood (ACCU-CHEK GUIDE) test strip Use to check blood sugar daily   Lactobacillus-Inulin (CULTURELLE ADULT ULT BALANCE PO) Take 1 tablet by mouth daily.   levothyroxine  (SYNTHROID ) 75 MCG  tablet TAKE ONE TABLET BY MOUTH EVERY MORNING BEFORE BREAKFAST   Magnesium  250 MG TABS Take 250 mg by mouth daily.   metFORMIN  (GLUCOPHAGE -XR) 500 MG 24 hr tablet Take 1,000 mg by mouth 2 (two) times daily with a meal.   Multiple Vitamins-Minerals (ONE-A-DAY WOMENS PETITES PO) Take 1 tablet by mouth daily.   nitroGLYCERIN  (NITROSTAT ) 0.4 MG SL tablet Place 1 tablet under tongue at onset of chest pain; you may repeat every 5 minutes for up to 3 doses.   pantoprazole  (PROTONIX ) 40 MG tablet Take 1 tablet (40 mg total) by mouth daily.   prasugrel  (EFFIENT ) 10 MG TABS tablet Take 1 tablet (10 mg total) by mouth daily.   rosuvastatin  (CRESTOR ) 40 MG tablet Take 1 tablet (40 mg total) by mouth daily.   sertraline  (ZOLOFT ) 50 MG tablet TAKE 1 TABLET BY MOUTH DAILY   traMADol (ULTRAM) 50 MG tablet Take 50 mg by mouth 2 (two) times daily as needed.   traZODone  (DESYREL ) 100 MG tablet TAKE 1 TABLET BY MOUTH AT BEDTIME   No facility-administered encounter medications on file as of 03/31/2024.    Allergies (verified) Cat dander, Pseudoeph-doxylamine-dm-apap, Gluten, and Penicillins   History: Past Medical History:  Diagnosis Date   CAD (coronary artery disease)    a. 01/2010 Cath: mild atherosclerosis in the mid LCX after takeoff of OM1, mild diff LAD dzs;  b. 07/2013 MV: EF 67%, no ischemia.   GAD (generalized anxiety disorder)    History of kidney stones    HLD (hyperlipidemia)    Hypothyroidism    Major depressive disorder, recurrent episode, in full remission 09/08/2008   Qualifier: Diagnosis of  By: Copland MD, [place]     Migraine headache    Myocarditis (HCC)    a. 01/2010 presented w/ c/p and elev trop-->Cath: mild atherosclerosis in the mid LCX after takeoff of OM1, mild diff LAD dzs;  b. 03/2010 Echo: EF 60-65%, DD, mild LVH;  c. 07/2011 Echo: EF 55-60%, mild LVH, no rwma.   Sleep apnea    has c-pap.  wears off and on - not lately  using  per reported 07-29-2020   Type 2 diabetes, diet  controlled [facility])    Past Surgical History:  Procedure Laterality Date   APPENDECTOMY  1974   BREAST SURGERY Right 1988   fatty tumore from breast   CARDIAC CATHETERIZATION  01/2010   [facility],   CHOLECYSTECTOMY  2002   COLONOSCOPY  2017   CORONARY STENT INTERVENTION N/A 09/25/2023   Procedure: CORONARY STENT INTERVENTION;  Surgeon: Swaziland, Peter M, MD;  Location: [facility] INVASIVE CV LAB;  Service: Cardiovascular;  Laterality: N/A;   CYST EXCISION     ovarian cyst 1994   INSERTION OF ARTERIOVENOUS (AV) ARTEGRAFT ARM Right 09/27/2023   Procedure: 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;  Surgeon: Pearline Norman RAMAN, MD;  Location: [facility] OR;  Service: Vascular;  Laterality: Right;   LEFT HEART CATH AND CORONARY ANGIOGRAPHY N/A 09/25/2023   Procedure: LEFT HEART CATH AND CORONARY ANGIOGRAPHY;  Surgeon: Swaziland, Peter M, MD;  Location: [facility] INVASIVE CV LAB;  Service: Cardiovascular;  Laterality: N/A;   NEPHROLITHOTOMY Right 08/20/2014   Procedure: RIGHT PERCUTANEOUS NEPHROLITHOTOMY ;  Surgeon: Norleen JINNY Seltzer, MD;  Location: [facility] ORS;  Service: Urology;  Laterality: Right;   POLYPECTOMY     RECTAL EXAM UNDER ANESTHESIA N/A 08/02/2020   Procedure: DONNY EXAM UNDER ANESTHESIA INJECTION OF PERIANAL BOTOX ;  Surgeon: Teresa Lonni HERO, MD;  Location: [facility];  Service: General;  Laterality: N/A;   TOTAL ABDOMINAL HYSTERECTOMY W/ BILATERAL SALPINGOOPHORECTOMY  2000   no ovaries, uterus, or cervix.   Family History  Problem Relation Age of Onset   Coronary artery disease Mother    Diabetes Mother    ALS Mother    Hyperlipidemia Father    Breast cancer Sister 82   Stomach cancer Maternal Grandfather    Colon cancer Maternal Grandfather        dx early 39's    Breast cancer Paternal Grandmother    Lung cancer Paternal Grandfather        CAD   Other Daughter        gluten   Colon cancer Son 66       3rd stage   Down syndrome Son    Esophageal  cancer Neg Hx    Liver cancer Neg Hx    Pancreatic cancer Neg Hx    Rectal cancer Neg Hx    Social History   Socioeconomic History   Marital status: Married    Spouse name: Koren   Number of children: 3   Years of education: Not on file   Highest education level: Not on file  Occupational History   Occupation: retired    Comment: [employer]  Tobacco Use   Smoking status: Former    Current packs/day: 0.00    Average packs/day: 0.4 packs/day for 35.0 years (12.3 ttl pk-yrs)    Types: Cigarettes    Start date: 09/25/1973    Quit date: 09/25/2008    Years since quitting: 15.5   Smokeless tobacco: Never   Tobacco comments:    quit in 2007-2008, social smoker  Vaping Use   Vaping status: Never Used  Substance and Sexual Activity   Alcohol use: Not Currently   Drug use: No   Sexual activity: Not Currently  Other Topics Concern   Not on file  Social History Narrative   Lives in [place] w/ husband.  No reg exercise.   Social Drivers of Corporate investment banker Strain: Low Risk  (03/31/2024)   Overall Financial Resource Strain (CARDIA)    Difficulty of Paying Living Expenses: Not hard at all  Food Insecurity: No Food Insecurity (03/31/2024)   Hunger Vital Sign    Worried About Running Out of Food in the Last Year: Never true    Ran Out of Food in the Last Year: Never true  Transportation Needs: No Transportation Needs (03/31/2024)   PRAPARE - Administrator, Civil Service (Medical): No    Lack of Transportation (Non-Medical): No  Physical Activity: Sufficiently Active (03/31/2024)   Exercise Vital Sign    Days of Exercise per Week: 3 days    Minutes of Exercise per Session: 60 min  Stress: No Stress Concern Present (03/31/2024)   [employer] of Occupational Health - Occupational Stress Questionnaire    Feeling of Stress: Not at all  Social Connections: Socially Integrated (03/31/2024)   Social Connection and Isolation Panel    Frequency of  Communication with Friends and Family: More than three  times a week    Frequency of Social Gatherings with Friends and Family: Once a week    Attends Religious Services: More than 4 times per year    Active Member of [employer] or Organizations: Yes    Attends Engineer, structural: More than 4 times per year    Marital Status: Married    Tobacco Counseling Counseling given: Not Answered Tobacco comments: quit in 2007-2008, social smoker    Clinical Intake:  Pre-visit preparation completed: Yes  Pain : No/denies pain     BMI - recorded: 26.78 Nutritional Status: BMI 25 -29 Overweight Nutritional Risks: None Diabetes: Yes CBG done?: No Did pt. bring in CBG monitor from home?: No  Lab Results  Component Value Date   HGBA1C 6.4 (A) 08/06/2023   HGBA1C 7.8 (H) 11/22/2022   HGBA1C 8.3 (A) 08/23/2022     How often do you need to have someone help you when you read instructions, pamphlets, or other written materials from your doctor or pharmacy?: 1 - Never  Interpreter Needed?: No  Information entered by :: Avah Bashor,CMA   Activities of Daily Living     03/31/2024    1:55 PM 09/26/2023    2:00 PM  In your present state of health, do you have any difficulty performing the following activities:  Hearing? 0 0  Vision? 0 0  Difficulty concentrating or making decisions? 0 0  Walking or climbing stairs? 0   Dressing or bathing? 0   Doing errands, shopping? 0 1  Preparing Food and eating ? N   Using the Toilet? N   In the past six months, have you accidently leaked urine? Y   Do you have problems with loss of bowel control? N   Managing your Medications? N   Managing your Finances? N   Housekeeping or managing your Housekeeping? N     Patient Care Team: Watt Mirza, MD as PCP - General Gollan, Evalene PARAS, MD as PCP - Cardiology (Cardiology) Dessa Reyes ORN, MD (General Surgery) Fleeta Milks, Lamar ORN, MD (Unknown Physician Specialty)  I have updated  your Care Teams any recent Medical Services you may have received from other providers in the past year.     Assessment:   This is a routine wellness examination for [place].  Hearing/Vision screen Hearing Screening - Comments:: No difficulties  Vision Screening - Comments:: Patient wears otc readers    Goals Addressed             This Visit's Progress    Patient Stated       To be healthier        Depression Screen     03/31/2024    1:58 PM 11/14/2023   11:54 AM 10/24/2023   11:42 AM 12/06/2021    9:21 AM 10/05/2021   10:55 AM 01/28/2020   11:33 AM 05/23/2017    8:55 AM  PHQ 2/9 Scores  PHQ - 2 Score 0 0 0 2 0 0 0  PHQ- 9 Score 0  0 3       Fall Risk     03/31/2024    1:54 PM 11/14/2023   11:53 AM 10/23/2023    1:06 PM 10/22/2023    1:45 PM 09/20/2023   11:23 AM  Fall Risk   Falls in the past year? 0 0 0 0 0  Number falls in past yr: 0 0 0  0  Injury with Fall? 0 0 0  0  Risk for fall  due to : No Fall Risks No Fall Risks   No Fall Risks  Follow up Falls evaluation completed Falls evaluation completed   Falls evaluation completed    MEDICARE RISK AT HOME:  Medicare Risk at Home Any stairs in or around the home?: No If so, are there any without handrails?: No Home free of loose throw rugs in walkways, pet beds, electrical cords, etc?: Yes Adequate lighting in your home to reduce risk of falls?: Yes Life alert?: No Use of a cane, walker or w/c?: No Grab bars in the bathroom?: No Shower chair or bench in shower?: No Elevated toilet seat or a handicapped toilet?: No  TIMED UP AND GO:  Was the test performed?  No  Cognitive Function: 6CIT completed        03/31/2024    1:58 PM  6CIT Screen  What Year? 0 points  What month? 0 points  What time? 0 points  Count back from 20 0 points  Months in reverse 0 points  Repeat phrase 0 points  Total Score 0 points    Immunizations Immunization History  Administered Date(s) Administered    sv, Bivalent, Protein  Subunit Rsvpref,pf (Abrysvo) 02/16/2022   INFLUENZA, HIGH DOSE SEASONAL PF 02/16/2022, 07/17/2023   Influenza Split 03/19/2013   Influenza,inj,Quad PF,6+ Mos 04/20/2017, 04/04/2018, 04/07/2019   Influenza-Unspecified 03/20/2015   Moderna Sars-Covid-2 Vaccination 08/13/2019, 09/12/2019   PFIZER(Purple Top)SARS-COV-2 Vaccination 04/13/2020, 11/20/2020   PNEUMOCOCCAL CONJUGATE-20 10/05/2021, 12/03/2022   Pfizer Covid-19 Vaccine Bivalent Booster 49yrs & up 04/19/2021, 12/06/2021   Pfizer(Comirnaty)Fall Seasonal Vaccine 12 years and older 07/17/2023   Pneumococcal Polysaccharide-23 03/02/2016   Td 05/24/2009   Tdap 12/03/2022   Zoster Recombinant(Shingrix) 08/16/2018, 01/10/2019    Screening Tests Health Maintenance  Topic Date Due   Diabetic kidney evaluation - Urine ACR  Never done   DEXA SCAN  Never done   FOOT EXAM  11/30/2023   HEMOGLOBIN A1C  02/03/2024   COVID-19 Vaccine (8 - 2025-26 season) 02/18/2024   Influenza Vaccine  09/16/2024 (Originally 01/18/2024)   OPHTHALMOLOGY EXAM  11/06/2024   Diabetic kidney evaluation - eGFR measurement  12/10/2024   Mammogram  02/19/2025   Medicare Annual Wellness (AWV)  03/31/2025   Colonoscopy  12/25/2027   DTaP/Tdap/Td (3 - Td or Tdap) 12/02/2032   Pneumococcal Vaccine: 50+ Years  Completed   Hepatitis C Screening  Completed   Zoster Vaccines- Shingrix  Completed   Meningococcal B Vaccine  Aged Out    Health Maintenance Items Addressed:  Additional Screening:  Vision Screening: Recommended annual ophthalmology exams for early detection of glaucoma and other disorders of the eye. Is the patient up to date with their annual eye exam?  Yes    Dental Screening: Recommended annual dental exams for proper oral hygiene  Community Resource Referral / Chronic Care Management: CRR required this visit?  No   CCM required this visit?  No   Plan:    I have personally reviewed and noted the following in the patient's chart:   Medical  and social history Use of alcohol, tobacco or illicit drugs  Current medications and supplements including opioid prescriptions. Patient is not currently taking opioid prescriptions. Functional ability and status Nutritional status Physical activity Advanced directives List of other physicians Hospitalizations, surgeries, and ER visits in previous 12 months Vitals Screenings to include cognitive, depression, and falls Referrals and appointments  In addition, I have reviewed and discussed with patient certain preventive protocols, quality metrics, and best practice recommendations. A  written personalized care plan for preventive services as well as general preventive health recommendations were provided to patient.   Lyle MARLA Right, [place]   03/31/2024   After Visit Summary: (MyChart) Due to this being a telephonic visit, the after visit summary with patients personalized plan was offered to patient via MyChart   Notes: Nothing significant to report at this time.

## 2024-03-31 NOTE — Patient Instructions (Signed)
 Discharge Patient Instructions  Patient Details  Name: Brandi Mahoney MRN: [id number] Date of Birth: [birth year] Referring Provider:  Watt Mirza, MD   Number of Visits: 25  Reason for Discharge:  Patient reached a stable level of exercise. Patient independent in their exercise. Patient has met program and personal goals.   Diagnosis:  Status post coronary artery stent placement  Initial Exercise Prescription:  Initial Exercise Prescription - 10/24/23 1100       Date of Initial Exercise RX and Referring Provider   Date 10/24/23    Referring Provider Dr. Timothy Gollan, MD      Oxygen   Maintain Oxygen Saturation 88% or higher      Treadmill   MPH 2.3    Grade 1    Minutes 15    METs 3.08      NuStep   Level 2    SPM 80    Minutes 15    METs 2.96      REL-XR   Level 2    Speed 50    Minutes 15    METs 2.96      Prescription Details   Frequency (times per week) 3    Duration Progress to 30 minutes of continuous aerobic without signs/symptoms of physical distress      Intensity   THRR 40-80% of Max Heartrate 108-138    Ratings of Perceived Exertion 11-13    Perceived Dyspnea 0-4      Progression   Progression Continue to progress workloads to maintain intensity without signs/symptoms of physical distress.      Resistance Training   Training Prescription Yes    Weight 4 lb    Reps 10-15          Discharge Exercise Prescription (Final Exercise Prescription Changes):  Exercise Prescription Changes - 03/18/24 1300       Response to Exercise   Blood Pressure (Admit) 124/58    Blood Pressure (Exit) 100/60    Heart Rate (Admit) 95 bpm    Heart Rate (Exercise) 106 bpm    Heart Rate (Exit) 95 bpm    Rating of Perceived Exertion (Exercise) 14    Symptoms none    Duration Continue with 30 min of aerobic exercise without signs/symptoms of physical distress.    Intensity THRR unchanged      Progression   Progression Continue to progress  workloads to maintain intensity without signs/symptoms of physical distress.    Average METs 2.59      Resistance Training   Training Prescription Yes    Weight 4 lb    Reps 10-15      Interval Training   Interval Training No      Treadmill   MPH 2.1    Grade 0    Minutes 15    METs 2.61      NuStep   Level 2    Minutes 15    METs 1.5      Elliptical   Level 1    Minutes 15    METs 3.7      Home Exercise Plan   Plans to continue exercise at [employer] (comment)   [employer]   Frequency Add 2 additional days to program exercise sessions.    Initial Home Exercises Provided 03/17/24      Oxygen   Maintain Oxygen Saturation 88% or higher          Functional Capacity:  6 Minute Walk     Row Name  10/24/23 1127 03/31/24 0940       6 Minute Walk   Phase Initial Discharge    Distance 1235 feet 1400 feet    Distance % Change -- 13.4 %    Distance Feet Change -- 165 ft    Walk Time 6 minutes 6 minutes    # of Rest Breaks 0 0    MPH 2.34 2.65    METS 2.96 3.2    RPE 10 12    Perceived Dyspnea  1 1    VO2 Peak 10.34 11.2    Symptoms No Yes (comment)    Comments -- Shoulder and neck aching/pain    Resting HR 78 bpm 80 bpm    Resting BP 128/64 104/56    Resting Oxygen Saturation  96 % 94 %    Exercise Oxygen Saturation  during 6 min walk 96 % 95 %    Max Ex. HR 99 bpm 98 bpm    Max Ex. BP 130/66 128/60    2 Minute Post BP 122/58 --      Nutrition & Weight - Outcomes:  Pre Biometrics - 10/24/23 1130       Pre Biometrics   Height 5' 4.5 (1.638 m)    Weight 154 lb 14.4 oz (70.3 kg)    Waist Circumference 36 inches    Hip Circumference 39 inches    Waist to Hip Ratio 0.92 %    BMI (Calculated) 26.19    Single Leg Stand 4.9 seconds          Post Biometrics - 03/31/24 0942        Post  Biometrics   Height 5' 4.5 (1.638 m)    Weight 156 lb 12.8 oz (71.1 kg)    Waist Circumference 36 inches    Hip Circumference 39 inches    Waist to Hip Ratio  0.92 %    BMI (Calculated) 26.51    Single Leg Stand 6.9 seconds         Goals reviewed with patient; copy given to patient.

## 2024-04-01 NOTE — Progress Notes (Signed)
 LUCILIA YANNI                                          MRN: [id number]   04/01/2024   The [facility] Quality Team Specialist reviewed this patient medical record for the purposes of chart review for care gap closure. The following were reviewed: abstraction for care gap closure-glycemic status assessment and kidney health evaluation for diabetes:eGFR  and uACR.    [facility] Quality Team

## 2024-04-02 ENCOUNTER — Encounter

## 2024-04-02 ENCOUNTER — Ambulatory Visit (INDEPENDENT_AMBULATORY_CARE_PROVIDER_SITE_OTHER): Admitting: Family Medicine

## 2024-04-02 VITALS — BP 110/60 | HR 73 | Temp 97.7°F | Ht 63.5 in | Wt 154.1 lb

## 2024-04-02 DIAGNOSIS — Z0000 Encounter for general adult medical examination without abnormal findings: Secondary | ICD-10-CM | POA: Diagnosis not present

## 2024-04-02 DIAGNOSIS — M2550 Pain in unspecified joint: Secondary | ICD-10-CM

## 2024-04-02 DIAGNOSIS — R7689 Other specified abnormal immunological findings in serum: Secondary | ICD-10-CM

## 2024-04-02 DIAGNOSIS — E2839 Other primary ovarian failure: Secondary | ICD-10-CM | POA: Diagnosis not present

## 2024-04-02 LAB — HIGH SENSITIVITY CRP: CRP, High Sensitivity: 1.23 mg/L (ref 0.000–5.000)

## 2024-04-02 LAB — SEDIMENTATION RATE: Sed Rate: 5 mm/h (ref 0–30)

## 2024-04-04 ENCOUNTER — Encounter

## 2024-04-07 ENCOUNTER — Encounter

## 2024-04-07 NOTE — Addendum Note (Signed)
 Addended by: HOPE VEVA PARAS on: 04/07/2024 11:19 AM   Modules accepted: Orders

## 2024-04-09 ENCOUNTER — Encounter

## 2024-04-09 DIAGNOSIS — Z955 Presence of coronary angioplasty implant and graft: Secondary | ICD-10-CM

## 2024-04-09 NOTE — Progress Notes (Signed)
 Cardiac Individual Treatment Plan  Patient Details  Name: Brandi Mahoney MRN: [id number] Date of Birth: 04-24-1956 Referring Provider:   Flowsheet Row Cardiac Rehab from 10/24/2023 in [facility] Cardiac and Pulmonary Rehab  Referring Provider Dr. Evalene Lunger, MD    Initial Encounter Date:  Flowsheet Row Cardiac Rehab from 10/24/2023 in [facility] Cardiac and Pulmonary Rehab  Date 10/24/23    Visit Diagnosis: Status post coronary artery stent placement  Patient's Home Medications on Admission:  Current Outpatient Medications:    albuterol  (VENTOLIN  HFA) 108 (90 Base) MCG/ACT inhaler, Inhale 2 puffs into the lungs every 6 (six) hours as needed for wheezing or shortness of breath., Disp: 8 g, Rfl: 2   amLODipine  (NORVASC ) 2.5 MG tablet, TAKE 1 TABLET BY MOUTH DAILY, Disp: 90 tablet, Rfl: 3   aspirin  EC 81 MG tablet, Take 81 mg by mouth daily., Disp: , Rfl:    clobetasol  ointment (TEMOVATE ) 0.05 %, Apply 1 Application topically daily as needed (skin conditions)., Disp: , Rfl:    fenofibrate  160 MG tablet, TAKE 1 TABLET BY MOUTH DAILY, Disp: 90 tablet, Rfl: 3   fluticasone (FLONASE) 50 MCG/ACT nasal spray, Place 2 sprays into both nostrils in the morning and at bedtime., Disp: , Rfl:    glucose blood (ACCU-CHEK GUIDE) test strip, Use to check blood sugar daily, Disp: 100 each, Rfl: 3   Lactobacillus-Inulin (CULTURELLE ADULT ULT BALANCE PO), Take 1 tablet by mouth daily., Disp: , Rfl:    levothyroxine  (SYNTHROID ) 75 MCG tablet, TAKE ONE TABLET BY MOUTH EVERY MORNING BEFORE BREAKFAST, Disp: 90 tablet, Rfl: 3   Magnesium  250 MG TABS, Take 250 mg by mouth daily., Disp: , Rfl:    metFORMIN  (GLUCOPHAGE -XR) 500 MG 24 hr tablet, Take 1,000 mg by mouth 2 (two) times daily with a meal., Disp: , Rfl:    Multiple Vitamins-Minerals (ONE-A-DAY WOMENS PETITES PO), Take 1 tablet by mouth daily., Disp: , Rfl:    nitroGLYCERIN  (NITROSTAT ) 0.4 MG SL tablet, Place 1 tablet under tongue at onset of chest pain; you may  repeat every 5 minutes for up to 3 doses., Disp: 25 tablet, Rfl: 3   pantoprazole  (PROTONIX ) 40 MG tablet, Take 1 tablet (40 mg total) by mouth daily., Disp: 90 tablet, Rfl: 3   prasugrel  (EFFIENT ) 10 MG TABS tablet, Take 1 tablet (10 mg total) by mouth daily., Disp: 30 tablet, Rfl: 11   rosuvastatin  (CRESTOR ) 40 MG tablet, Take 1 tablet (40 mg total) by mouth daily., Disp: 90 tablet, Rfl: 3   sertraline  (ZOLOFT ) 50 MG tablet, TAKE 1 TABLET BY MOUTH DAILY, Disp: 90 tablet, Rfl: 0   traMADol (ULTRAM) 50 MG tablet, Take 50 mg by mouth 2 (two) times daily as needed., Disp: , Rfl:    traZODone  (DESYREL ) 100 MG tablet, TAKE 1 TABLET BY MOUTH AT BEDTIME, Disp: 30 tablet, Rfl: 2  Past Medical History: Past Medical History:  Diagnosis Date   CAD (coronary artery disease)    a. 01/2010 Cath: mild atherosclerosis in the mid LCX after takeoff of OM1, mild diff LAD dzs;  b. 07/2013 MV: EF 67%, no ischemia.   GAD (generalized anxiety disorder)    History of kidney stones    HLD (hyperlipidemia)    Hypothyroidism    Major depressive disorder, recurrent episode, in full remission 09/08/2008   Qualifier: Diagnosis of  By: Copland MD, Spencer     Migraine headache    Myocarditis (HCC)    a. 01/2010 presented w/ c/p and elev trop-->Cath: mild  atherosclerosis in the mid LCX after takeoff of OM1, mild diff LAD dzs;  b. 03/2010 Echo: EF 60-65%, DD, mild LVH;  c. 07/2011 Echo: EF 55-60%, mild LVH, no rwma.   Sleep apnea    has c-pap.  wears off and on - not lately  using  per reported 07-29-2020   Type 2 diabetes, diet controlled (HCC)     Tobacco Use: Social History   Tobacco Use  Smoking Status Former   Current packs/day: 0.00   Average packs/day: 0.4 packs/day for 35.0 years (12.3 ttl pk-yrs)   Types: Cigarettes   Start date: 09/25/1973   Quit date: 09/25/2008   Years since quitting: 15.5  Smokeless Tobacco Never  Tobacco Comments   quit in 2007-2008, social smoker    Labs: Review Flowsheet  More data  exists      Latest Ref Rng & Units 08/23/2022 11/22/2022 08/06/2023 11/30/2023 03/31/2024  Labs for ITP Cardiac and Pulmonary Rehab  Cholestrol 0 - 200 mg/dL - 875  - 80  94   LDL (calc) 0 - 99 mg/dL - - - 21  20   Direct LDL 0 - 99 mg/dL - 37.9  - 19  -  HDL-C >39.00 mg/dL - 59.89  - 36  56.69   Trlycerides 0.0 - 149.0 mg/dL - 749.9  - 869  847.9   Hemoglobin A1c 4.6 - 6.5 % 8.3  7.8  6.4  - 7.0      Exercise Target Goals: Exercise Program Goal: Individual exercise prescription set using results from initial 6 min walk test and THRR while considering  patient's activity barriers and safety.   Exercise Prescription Goal: Initial exercise prescription builds to 30-45 minutes a day of aerobic activity, 2-3 days per week.  Home exercise guidelines will be given to patient during program as part of exercise prescription that the participant will acknowledge.   Education: Aerobic Exercise: - Group verbal and visual presentation on the components of exercise prescription. Introduces F.I.T.T principle from ACSM for exercise prescriptions.  Reviews F.I.T.T. principles of aerobic exercise including progression. Written material provided at class time.   Education: Resistance Exercise: - Group verbal and visual presentation on the components of exercise prescription. Introduces F.I.T.T principle from ACSM for exercise prescriptions  Reviews F.I.T.T. principles of resistance exercise including progression. Written material provided at class time.    Education: Exercise & Equipment Safety: - Individual verbal instruction and demonstration of equipment use and safety with use of the equipment. Flowsheet Row Cardiac Rehab from 10/24/2023 in [facility] Cardiac and Pulmonary Rehab  Date 10/24/23  Educator NT  Instruction Review Code 1- Verbalizes Understanding    Education: Exercise Physiology & General Exercise Guidelines: - Group verbal and written instruction with models to review the exercise physiology  of the cardiovascular system and associated critical values. Provides general exercise guidelines with specific guidelines to those with heart or lung disease. Written material provided at class time.   Education: Flexibility, Balance, Mind/Body Relaxation: - Group verbal and visual presentation with interactive activity on the components of exercise prescription. Introduces F.I.T.T principle from ACSM for exercise prescriptions. Reviews F.I.T.T. principles of flexibility and balance exercise training including progression. Also discusses the mind body connection.  Reviews various relaxation techniques to help reduce and manage stress (i.e. Deep breathing, progressive muscle relaxation, and visualization). Balance handout provided to take home. Written material provided at class time.   Activity Barriers & Risk Stratification:  Activity Barriers & Cardiac Risk Stratification - 10/23/23 1306  Activity Barriers & Cardiac Risk Stratification   Activity Barriers Back Problems;Arthritis    Cardiac Risk Stratification Moderate          6 Minute Walk:  6 Minute Walk     Row Name 10/24/23 1127 03/31/24 0940       6 Minute Walk   Phase Initial Discharge    Distance 1235 feet 1400 feet    Distance % Change -- 13.4 %    Distance Feet Change -- 165 ft    Walk Time 6 minutes 6 minutes    # of Rest Breaks 0 0    MPH 2.34 2.65    METS 2.96 3.2    RPE 10 12    Perceived Dyspnea  1 1    VO2 Peak 10.34 11.2    Symptoms No Yes (comment)    Comments -- Shoulder and neck aching/pain    Resting HR 78 bpm 80 bpm    Resting BP 128/64 104/56    Resting Oxygen Saturation  96 % 94 %    Exercise Oxygen Saturation  during 6 min walk 96 % 95 %    Max Ex. HR 99 bpm 98 bpm    Max Ex. BP 130/66 128/60    2 Minute Post BP 122/58 --       Oxygen Initial Assessment:   Oxygen Re-Evaluation:   Oxygen Discharge (Final Oxygen Re-Evaluation):   Initial Exercise Prescription:  Initial Exercise  Prescription - 10/24/23 1100       Date of Initial Exercise RX and Referring Provider   Date 10/24/23    Referring Provider Dr. Timothy Gollan, MD      Oxygen   Maintain Oxygen Saturation 88% or higher      Treadmill   MPH 2.3    Grade 1    Minutes 15    METs 3.08      NuStep   Level 2    SPM 80    Minutes 15    METs 2.96      REL-XR   Level 2    Speed 50    Minutes 15    METs 2.96      Prescription Details   Frequency (times per week) 3    Duration Progress to 30 minutes of continuous aerobic without signs/symptoms of physical distress      Intensity   THRR 40-80% of Max Heartrate 108-138    Ratings of Perceived Exertion 11-13    Perceived Dyspnea 0-4      Progression   Progression Continue to progress workloads to maintain intensity without signs/symptoms of physical distress.      Resistance Training   Training Prescription Yes    Weight 4 lb    Reps 10-15          Perform Capillary Blood Glucose checks as needed.  Exercise Prescription Changes:   Exercise Prescription Changes     Row Name 10/24/23 1100 11/13/23 1400 11/29/23 0800 12/12/23 0800 12/27/23 1600     Response to Exercise   Blood Pressure (Admit) 128/64 118/60 124/62 134/63 128/64   Blood Pressure (Exercise) 130/66 134/68 148/70 132/52 --   Blood Pressure (Exit) 122/58 118/62 122/64 120/60 110/60   Heart Rate (Admit) 78 bpm 77 bpm 80 bpm 74 bpm 77 bpm   Heart Rate (Exercise) 99 bpm 110 bpm 103 bpm 105 bpm 117 bpm   Heart Rate (Exit) 75 bpm 64 bpm 78 bpm 73 bpm 68 bpm   Oxygen Saturation (Admit)  96 % -- -- -- --   Oxygen Saturation (Exercise) 96 % -- -- -- --   Rating of Perceived Exertion (Exercise) 10 13 13 12 13    Perceived Dyspnea (Exercise) 1 0 -- -- --   Symptoms none none none none none   Comments Results first 2 weeks of exercise -- -- --   Duration -- Progress to 30 minutes of  aerobic without signs/symptoms of physical distress Continue with 30 min of aerobic exercise  without signs/symptoms of physical distress. Continue with 30 min of aerobic exercise without signs/symptoms of physical distress. Continue with 30 min of aerobic exercise without signs/symptoms of physical distress.   Intensity -- THRR unchanged THRR unchanged THRR unchanged THRR unchanged     Progression   Progression -- Continue to progress workloads to maintain intensity without signs/symptoms of physical distress. Continue to progress workloads to maintain intensity without signs/symptoms of physical distress. Continue to progress workloads to maintain intensity without signs/symptoms of physical distress. Continue to progress workloads to maintain intensity without signs/symptoms of physical distress.   Average METs -- 2.4 3.34 2.6 2.9     Resistance Training   Training Prescription -- Yes Yes Yes Yes   Weight -- 4 lb 4 lb 4 lb 4 lb   Reps -- 10-15 10-15 10-15 10-15     Interval Training   Interval Training -- No No No No     Treadmill   MPH -- 2.2 2.2 2 2.2   Grade -- 1 1 0 0   Minutes -- 15 15 15 15    METs -- 2.99 2.99 2.53 2.69     NuStep   Level -- 2  T6 -- -- --   Minutes -- 15 -- -- --   METs -- 1.7 -- -- --     Elliptical   Level -- -- -- -- 1   Minutes -- -- -- -- 15   METs -- -- -- -- 3.7     REL-XR   Level -- 1 -- 1 2   Minutes -- 15 -- 15 15   METs -- 1.4 -- 2.3 3     T5 Nustep   Level -- -- -- 1 --   Minutes -- -- -- 15 --   METs -- -- -- 1.7 --     Rower   Level -- -- 3 4 --   Watts -- -- 15 10 --   Minutes -- -- 15 15 --   METs -- -- 4.28 4.05 --     Oxygen   Maintain Oxygen Saturation -- 88% or higher 88% or higher 88% or higher 88% or higher    Row Name 01/08/24 1400 01/24/24 1700 03/04/24 1500 03/17/24 0900 03/18/24 1300     Response to Exercise   Blood Pressure (Admit) 134/70 118/62 122/60 -- 124/58   Blood Pressure (Exit) 130/80 116/60 116/64 -- 100/60   Heart Rate (Admit) 71 bpm 89 bpm 89 bpm -- 95 bpm   Heart Rate (Exercise) 114 bpm  116 bpm 109 bpm -- 106 bpm   Heart Rate (Exit) 82 bpm 85 bpm 79 bpm -- 95 bpm   Rating of Perceived Exertion (Exercise) 12 15 12  -- 14   Symptoms none none none -- none   Duration Continue with 30 min of aerobic exercise without signs/symptoms of physical distress. Continue with 30 min of aerobic exercise without signs/symptoms of physical distress. Continue with 30 min of aerobic exercise without signs/symptoms of physical  distress. Continue with 30 min of aerobic exercise without signs/symptoms of physical distress. Continue with 30 min of aerobic exercise without signs/symptoms of physical distress.   Intensity THRR unchanged THRR unchanged THRR unchanged THRR unchanged THRR unchanged     Progression   Progression Continue to progress workloads to maintain intensity without signs/symptoms of physical distress. Continue to progress workloads to maintain intensity without signs/symptoms of physical distress. Continue to progress workloads to maintain intensity without signs/symptoms of physical distress. Continue to progress workloads to maintain intensity without signs/symptoms of physical distress. Continue to progress workloads to maintain intensity without signs/symptoms of physical distress.   Average METs 2.9 2.67 3.08 3.08 2.59     Resistance Training   Training Prescription Yes Yes Yes Yes Yes   Weight 4 lb 4 lb 4 lb 4 lb 4 lb   Reps 10-15 10-15 10-15 10-15 10-15     Interval Training   Interval Training No No No No No     Treadmill   MPH 2.1 2.2 2.1 2.1 2.1   Grade 0 0 0.5 0.5 0   Minutes 15 15 15 15 15    METs 2.61 2.69 2.75 2.75 2.61     Recumbant Bike   Level 3 -- -- -- --   Watts 18 -- -- -- --   Minutes 15 -- -- -- --   METs 3.09 -- -- -- --     NuStep   Level -- 2  T6 -- -- 2   Minutes -- 15 -- -- 15   METs -- 1.6 -- -- 1.5     Elliptical   Level -- 1 1 1 1    Minutes -- 15 15 15 15    METs -- 3.9 3.8 3.8 3.7     REL-XR   Level -- 1 -- -- --   Minutes -- 15 --  -- --     Home Exercise Plan   Plans to continue exercise at -- -- -- [employer] (comment)  [facility]  [employer]   Frequency -- -- -- Add 2 additional days to program exercise sessions. Add 2 additional days to program exercise sessions.   Initial Home Exercises Provided -- -- -- 03/17/24 03/17/24     Oxygen   Maintain Oxygen Saturation 88% or higher 88% or higher 88% or higher 88% or higher 88% or higher    Row Name 04/03/24 1400             Response to Exercise   Blood Pressure (Admit) 124/68       Blood Pressure (Exit) 112/60       Heart Rate (Admit) 80 bpm       Heart Rate (Exercise) 95 bpm       Heart Rate (Exit) 86 bpm       Oxygen Saturation (Admit) 94 %       Oxygen Saturation (Exercise) 94 %       Oxygen Saturation (Exit) 97 %       Rating of Perceived Exertion (Exercise) 13       Symptoms none       Duration Continue with 30 min of aerobic exercise without signs/symptoms of physical distress.       Intensity THRR unchanged         Progression   Progression Continue to progress workloads to maintain intensity without signs/symptoms of physical distress.       Average METs 2.7  Resistance Training   Training Prescription Yes       Weight 4 lb       Reps 10-15         Interval Training   Interval Training No         Treadmill   MPH 2       Grade 0       Minutes 15       METs 2.53         Elliptical   Level 2       Speed 1       Minutes 15       METs 2.6         Home Exercise Plan   Plans to continue exercise at [employer] (comment)  [employer]       Frequency Add 2 additional days to program exercise sessions.       Initial Home Exercises Provided 03/17/24         Oxygen   Maintain Oxygen Saturation 88% or higher          Exercise Comments:   Exercise Comments     Row Name 10/29/23 0954           Exercise Comments First full day of exercise!  Patient was oriented to gym and equipment including  functions, settings, policies, and procedures.  Patient's individual exercise prescription and treatment plan were reviewed.  All starting workloads were established based on the results of the 6 minute walk test done at initial orientation visit.  The plan for exercise progression was also introduced and progression will be customized based on patient's performance and goals.          Exercise Goals and Review:   Exercise Goals     Row Name 10/24/23 1128             Exercise Goals   Increase Physical Activity Yes       Intervention Provide advice, education, support and counseling about physical activity/exercise needs.;Develop an individualized exercise prescription for aerobic and resistive training based on initial evaluation findings, risk stratification, comorbidities and participant's personal goals.       Expected Outcomes Short Term: Attend rehab on a regular basis to increase amount of physical activity.;Long Term: Add in home exercise to make exercise part of routine and to increase amount of physical activity.;Long Term: Exercising regularly at least 3-5 days a week.       Increase Strength and Stamina Yes       Intervention Provide advice, education, support and counseling about physical activity/exercise needs.;Develop an individualized exercise prescription for aerobic and resistive training based on initial evaluation findings, risk stratification, comorbidities and participant's personal goals.       Expected Outcomes Short Term: Increase workloads from initial exercise prescription for resistance, speed, and METs.;Short Term: Perform resistance training exercises routinely during rehab and add in resistance training at home;Long Term: Improve cardiorespiratory fitness, muscular endurance and strength as measured by increased METs and functional capacity ( )       Able to understand and use rate of perceived exertion (RPE) scale Yes       Intervention Provide education and  explanation on how to use RPE scale       Expected Outcomes Short Term: Able to use RPE daily in rehab to express subjective intensity level;Long Term:  Able to use RPE to guide intensity level when exercising independently       Able to understand and  use Dyspnea scale Yes       Intervention Provide education and explanation on how to use Dyspnea scale       Expected Outcomes Short Term: Able to use Dyspnea scale daily in rehab to express subjective sense of shortness of breath during exertion;Long Term: Able to use Dyspnea scale to guide intensity level when exercising independently       Knowledge and understanding of Target Heart Rate Range (THRR) Yes       Intervention Provide education and explanation of THRR including how the numbers were predicted and where they are located for reference       Expected Outcomes Short Term: Able to state/look up THRR;Long Term: Able to use THRR to govern intensity when exercising independently;Short Term: Able to use daily as guideline for intensity in rehab       Able to check pulse independently Yes       Intervention Provide education and demonstration on how to check pulse in carotid and radial arteries.;Review the importance of being able to check your own pulse for safety during independent exercise       Expected Outcomes Short Term: Able to explain why pulse checking is important during independent exercise;Long Term: Able to check pulse independently and accurately       Understanding of Exercise Prescription Yes       Intervention Provide education, explanation, and written materials on patient's individual exercise prescription       Expected Outcomes Short Term: Able to explain program exercise prescription;Long Term: Able to explain home exercise prescription to exercise independently          Exercise Goals Re-Evaluation :  Exercise Goals Re-Evaluation     Row Name 10/29/23 0955 11/13/23 1437 11/29/23 0806 12/12/23 0831 12/27/23 1605      Exercise Goal Re-Evaluation   Exercise Goals Review Able to understand and use rate of perceived exertion (RPE) scale;Able to understand and use Dyspnea scale;Knowledge and understanding of Target Heart Rate Range (THRR) Increase Physical Activity;Increase Strength and Stamina;Understanding of Exercise Prescription Increase Physical Activity;Increase Strength and Stamina;Understanding of Exercise Prescription Increase Physical Activity;Increase Strength and Stamina;Understanding of Exercise Prescription Increase Physical Activity;Increase Strength and Stamina;Understanding of Exercise Prescription   Comments Reviewed RPE and dyspnea scale, THR and program prescription with pt today.  Pt voiced understanding and was given a copy of goals to take home. [name] is off to a good start in the program. She was able to attend her first four sessions during this review. During her first sessions she was able to use the treadmill at a speed of 2. and 1% incline, and the T6 nustep at level 2. We will continue to monitor her progress in the program. [name] has only attended two sessions since the last review. She has continued to walk the treadmill at a speed of 2.2 mph with an incline of 1%. She also began using the rower at level 3. We will continue to monitor her progress in the program. [name] is doing well in rehab. She was recently able to increase from level 3 to 4 on the rowing machine. She was also able to maintain level 1 on the XR. We will continue to monitor her progress in the program. [name] is doing well in rehab. Sheincreased her workload on the treadmill to a speed of 2.2 mph with no incline. She also increased to level 2 on the XR. She added the elliptical at level 1 and tolerated it well. We will continue  to monitor her progress in the program.   Expected Outcomes Short: Use RPE daily to regulate intensity. Long: Follow program prescription in THR. Short: Continue to follow exercise prescription. Long: Continue  exercise to imporve strength and stamina. Short: Attend rehab more regularly. Long: Continue exercise to imporve strength and stamina. Short: increase treadmill workload. Long: Continue exercise to improve strength and stamina. Short: Continue to progressively increase treadmill and XR workloads. Long: Continue exercise to improve strength and stamina.    Row Name 01/08/24 1425 01/24/24 1740 02/07/24 1125 02/21/24 0730 03/04/24 1525     Exercise Goal Re-Evaluation   Exercise Goals Review Increase Physical Activity;Increase Strength and Stamina;Understanding of Exercise Prescription Increase Physical Activity;Increase Strength and Stamina;Understanding of Exercise Prescription Increase Physical Activity;Increase Strength and Stamina;Understanding of Exercise Prescription Increase Physical Activity;Increase Strength and Stamina;Understanding of Exercise Prescription Increase Physical Activity;Increase Strength and Stamina;Understanding of Exercise Prescription   Comments [name] has only attended one session since the last review. She was able to continue to walk the treadmill at a speed of 2.1 mph with no incline. She also began using the recumbent bike and did well at level 3. We will continue to monitor her progress in the program. [name] is doing well in rehab. She increased her workload on the treadmill to a speed of 2.2 mph with no incline. She maintained level 1 on the elliptical and XR. She also maintained level 2 on the T6. We will continue to monitor her progress in the program. [name] has not attended rehab since the last review. She has not been to rehab since 08/01. We will contact her to seen when she plans to return to the program. We will continue to monitor her progress. [name] has not attended rehab since the last review. She has not been to rehab since 08/01 due to being sick. We will continue to monitor her progress when she returns to the program. [name] has returned to the program and is doing well. She  recently increased her treadmill workload to a speed of 2.1 mph with an incline of 0.5%. She also continues to work at level 1 on the elliptical. We will continue to monitor her progress in the program.   Expected Outcomes Short: Attend rehab more consistently. Long: Continue exercise to improve strength and stamina. Short: Try level 2 on the XR. Long: Continue exercise to improve strength and stamina. Short: Return to rehab when appropriate. Long: Graduate. Short: Return to rehab when appropriate. Long: Graduate. Short: Continue to progressively increase workloads. Long: Continue exercise to improve strength and stamina.    Row Name 03/12/24 9047 03/17/24 0941 03/18/24 1347 04/03/24 1411       Exercise Goal Re-Evaluation   Exercise Goals Review Increase Physical Activity;Increase Strength and Stamina;Understanding of Exercise Prescription Increase Physical Activity;Able to understand and use rate of perceived exertion (RPE) scale;Knowledge and understanding of Target Heart Rate Range (THRR);Understanding of Exercise Prescription;Increase Strength and Stamina;Able to understand and use Dyspnea scale;Able to check pulse independently Increase Physical Activity;Increase Strength and Stamina;Understanding of Exercise Prescription Increase Physical Activity;Increase Strength and Stamina;Understanding of Exercise Prescription    Comments [name] is doing well in rehab, She is also goign to [facility] some on days not at rehab. ENcouraged her to continue to exercise at home as well as work on increasing workloads here at rehab Reviewed home exercise with pt today.  Pt plans to work out at [employer] for exercise.  Reviewed THR, pulse, RPE, sign and symptoms, pulse oximetery and when to  call 911 or MD.  Also discussed weather considerations and indoor options.  Pt voiced understanding. [name] is doing well in the program. She continues to walk the treadmill at 2.1 mph with no incline. She also continues to work at level 2 on the  T4 nustep and level 1 on the elliptical. We will continue to monitor her progress in the program. [name] is doing well in rehab. She was recently able to increase from level 1 to 2 on the elliptical, and maintain a speed of on the treadmill. We will continue to monitor her progress in the program.    Expected Outcomes Short: Continue to progressively increase workloads. Long: Continue exercise to improve strength and stamina. Short: add 1-2 days a week of exercise on off days of rehab. Long: maintain independent exercise routine upon graduation from cardiac rehab. Short: Increase treadmill workload back up to previous level. Long: Continue exercise to improve strength and stamina. Short: Improve on . Long: Continue exercise to improve strength and stamina.       Discharge Exercise Prescription (Final Exercise Prescription Changes):  Exercise Prescription Changes - 04/03/24 1400       Response to Exercise   Blood Pressure (Admit) 124/68    Blood Pressure (Exit) 112/60    Heart Rate (Admit) 80 bpm    Heart Rate (Exercise) 95 bpm    Heart Rate (Exit) 86 bpm    Oxygen Saturation (Admit) 94 %    Oxygen Saturation (Exercise) 94 %    Oxygen Saturation (Exit) 97 %    Rating of Perceived Exertion (Exercise) 13    Symptoms none    Duration Continue with 30 min of aerobic exercise without signs/symptoms of physical distress.    Intensity THRR unchanged      Progression   Progression Continue to progress workloads to maintain intensity without signs/symptoms of physical distress.    Average METs 2.7      Resistance Training   Training Prescription Yes    Weight 4 lb    Reps 10-15      Interval Training   Interval Training No      Treadmill   MPH 2    Grade 0    Minutes 15    METs 2.53      Elliptical   Level 2    Speed 1    Minutes 15    METs 2.6      Home Exercise Plan   Plans to continue exercise at [employer] (comment)   [facility]   Frequency Add 2 additional days to  program exercise sessions.    Initial Home Exercises Provided 03/17/24      Oxygen   Maintain Oxygen Saturation 88% or higher          Nutrition:  Target Goals: Understanding of nutrition guidelines, daily intake of sodium 1500mg , cholesterol 200mg , calories 30% from fat and 7% or less from saturated fats, daily to have 5 or more servings of fruits and vegetables.  Education: Nutrition 1 -Group instruction provided by verbal, written material, interactive activities, discussions, models, and posters to present general guidelines for heart healthy nutrition including macronutrients, label reading, and promoting whole foods over processed counterparts. Education serves as Pensions consultant of discussion of heart healthy eating for all. Written material provided at class time.    Education: Nutrition 2 -Group instruction provided by verbal, written material, interactive activities, discussions, models, and posters to present general guidelines for heart healthy nutrition including sodium, cholesterol, and saturated fat.  Providing guidance of habit forming to improve blood pressure, cholesterol, and body weight. Written material provided at class time.     Biometrics:  Pre Biometrics - 10/24/23 1130       Pre Biometrics   Height 5' 4.5 (1.638 m)    Weight 154 lb 14.4 oz (70.3 kg)    Waist Circumference 36 inches    Hip Circumference 39 inches    Waist to Hip Ratio 0.92 %    BMI (Calculated) 26.19    Single Leg Stand 4.9 seconds          Post Biometrics - 03/31/24 0942        Post  Biometrics   Height 5' 4.5 (1.638 m)    Weight 156 lb 12.8 oz (71.1 kg)    Waist Circumference 36 inches    Hip Circumference 39 inches    Waist to Hip Ratio 0.92 %    BMI (Calculated) 26.51    Single Leg Stand 6.9 seconds          Nutrition Therapy Plan and Nutrition Goals:  Nutrition Therapy & Goals - 10/24/23 1513       Nutrition Therapy   Diet Carb controlled, Cardiac, Low Na     Protein (specify units) 70-90g    Fiber 25 grams    Whole Grain Foods 3 servings    Saturated Fats 15 max. grams    Fruits and Vegetables 5 servings/day    Sodium 2 grams      Personal Nutrition Goals   Nutrition Goal Read labels and reduce sodium intake to below 2300mg . Ideally 1500mg  per day.    Personal Goal #2 Eat 15-30gProtein and 30-60gCarbs at each meal.    Personal Goal #3 Reduce saturated fat, less than 12g per day. Replace bad fats for more heart healthy fats.    Comments Patient drinking ~50-75oz of water. She eats 3 meals per day, sometimes will have fruit as a snack between meals. She has DM and knows to monitor her carb intake. Reviewed goal of ~30-60g carbs per meal. Encouraged her to pair her carbs with protein. She has been watching her salt intake trying to stay below 2300mg . Provided her a guideline of less than 1500mg  per day. Reviewed mediterranean diet handout. Educated on types of fats, sources, and how to read labels. Reviewed several facts labels together. She is knowledgeable of foods and macros. She knows what to do and has followed a heart healthy diet years ago. She wants to set goal to be more strict on sodium and saturated fats, and stay consistent with her healthy eating.      Intervention Plan   Intervention Prescribe, educate and counsel regarding individualized specific dietary modifications aiming towards targeted core components such as weight, hypertension, lipid management, diabetes, heart failure and other comorbidities.;Nutrition handout(s) given to patient.    Expected Outcomes Short Term Goal: Understand basic principles of dietary content, such as calories, fat, sodium, cholesterol and nutrients.;Short Term Goal: A plan has been developed with personal nutrition goals set during dietitian appointment.;Long Term Goal: Adherence to prescribed nutrition plan.          Nutrition Assessments:  MEDIFICTS Score Key: >=70 Need to make dietary changes   40-70 Heart Healthy Diet <= 40 Therapeutic Level Cholesterol Diet   Picture Your Plate Scores: <59 Unhealthy dietary pattern with much room for improvement. 41-50 Dietary pattern unlikely to meet recommendations for good health and room for improvement. 51-60 More healthful dietary pattern, with some room  for improvement.  >60 Healthy dietary pattern, although there may be some specific behaviors that could be improved.    Nutrition Goals Re-Evaluation:  Nutrition Goals Re-Evaluation     Row Name 11/26/23 [id number] 03/12/24 0956 03/26/24 0948         Goals   Current Weight 157 lb 3.2 oz (71.3 kg) -- --     Comment [name] is complying with RD nutrition goals. She is still trying to lose some weight and says her target weight it 145lb. [name] reports she is follow a heart healthy diet. Cutting back on sodium and saturated fat in her diet. Drinking adequate water [name] reports that she is very consious of what she eats to help manage her and her husband's health conditions. She is very comfortable reading food labels on a daily basis and watches sodium. She reports incorporating lean protein and fruits and vegetables into her diet. She also is making a consious effort to drink plenty of water.     Expected Outcome Short: Continue to diet and follow RD guidelines. Long: Continue to attend rehab and manage diet. Short: Continue to diet and follow RD guidelines. Long: Continue to attend rehab and manage diet. Short: continue to read food labels and watch sodium. Long: maintian heart healthy diet upon graduation from program.        Nutrition Goals Discharge (Final Nutrition Goals Re-Evaluation):  Nutrition Goals Re-Evaluation - 03/26/24 0948       Goals   Comment [name] reports that she is very consious of what she eats to help manage her and her husband's health conditions. She is very comfortable reading food labels on a daily basis and watches sodium. She reports incorporating lean protein and fruits  and vegetables into her diet. She also is making a consious effort to drink plenty of water.    Expected Outcome Short: continue to read food labels and watch sodium. Long: maintian heart healthy diet upon graduation from program.          Psychosocial: Target Goals: Acknowledge presence or absence of significant depression and/or stress, maximize coping skills, provide positive support system. Participant is able to verbalize types and ability to use techniques and skills needed for reducing stress and depression.   Education: Stress, Anxiety, and Depression - Group verbal and visual presentation to define topics covered.  Reviews how body is impacted by stress, anxiety, and depression.  Also discusses healthy ways to reduce stress and to treat/manage anxiety and depression. Written material provided at class time.   Education: Sleep Hygiene -Provides group verbal and written instruction about how sleep can affect your health.  Define sleep hygiene, discuss sleep cycles and impact of sleep habits. Review good sleep hygiene tips.   Initial Review & Psychosocial Screening:  Initial Psych Review & Screening - 10/23/23 1310       Initial Review   Current issues with Current Stress Concerns      Family Dynamics   Good Support System? Yes      Barriers   Psychosocial barriers to participate in program There are no identifiable barriers or psychosocial needs.;The patient should benefit from training in stress management and relaxation.      Screening Interventions   Interventions Encouraged to exercise;Provide feedback about the scores to participant;To provide support and resources with identified psychosocial needs    Expected Outcomes Short Term goal: Utilizing psychosocial counselor, staff and physician to assist with identification of specific Stressors or current issues interfering with healing process. Setting  desired goal for each stressor or current issue identified.;Long Term  Goal: Stressors or current issues are controlled or eliminated.;Short Term goal: Identification and review with participant of any Quality of Life or Depression concerns found by scoring the questionnaire.;Long Term goal: The participant improves quality of Life and PHQ9 Scores as seen by post scores and/or verbalization of changes          Quality of Life Scores:   Scores of 19 and below usually indicate a poorer quality of life in these areas.  A difference of  2-3 points is a clinically meaningful difference.  A difference of 2-3 points in the total score of the Quality of Life Index has been associated with significant improvement in overall quality of life, self-image, physical symptoms, and general health in studies assessing change in quality of life.  PHQ-9: Review Flowsheet  More data exists      03/31/2024 11/14/2023 10/24/2023 12/06/2021 10/05/2021  Depression screen PHQ 2/9  Decreased Interest 0 0 0 0 0  Down, Depressed, Hopeless 0 0 0 2 0  PHQ - 2 Score 0 0 0 2 0  Altered sleeping 0 - 0 1 -  Tired, decreased energy 0 - 0 0 -  Change in appetite 0 - 0 0 -  Feeling bad or failure about yourself  0 - 0 0 -  Trouble concentrating 0 - 0 0 -  Moving slowly or fidgety/restless 0 - 0 0 -  Suicidal thoughts 0 - 0 0 -  PHQ-9 Score 0 - 0 3 -  Difficult doing work/chores Not difficult at all - - Somewhat difficult -   Interpretation of Total Score  Total Score Depression Severity:  1-4 = Minimal depression, 5-9 = Mild depression, 10-14 = Moderate depression, 15-19 = Moderately severe depression, 20-27 = Severe depression   Psychosocial Evaluation and Intervention:  Psychosocial Evaluation - 10/23/23 1321       Psychosocial Evaluation & Interventions   Comments [name] is coming to cardiac rehab after a stent. 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 She also is being worked up for pulmonary issues and  is scheduled for a CT and PFTs to help provide reasons as to why she feels short of breath. She states she feels like she is handling all her medical issues well, but admits it has been a lot on her. She focuses on one day at a time and being thankful for what she has. She has a great support system. She suffers with insomnia and has for years now, so getting good rest if difficult for her.    Expected Outcomes Short: attend cardiac rehab for education and exercise Long: develop and maintain positive self care habits    Continue Psychosocial Services  Follow up required by staff          Psychosocial Re-Evaluation:  Psychosocial Re-Evaluation     Row Name 11/26/23 0940 03/12/24 0955 03/26/24 0955         Psychosocial Re-Evaluation   Current issues with Current Stress Concerns Current Sleep Concerns Current Sleep Concerns     Comments [name] states that she isnt dealing with any stressors at this time. She does state that she deals insomnia, she was prescribed medication and that seems to be helping. [name] denies any stress, anxiety or depression at this time. She is dealing with insomnia and has been taking medication. Some night it helps some  nights it does not. [name] reports that she continues to take her medicaiton from insomnia and that it pretty much helps manage her sleep patterns. She reports no concerns or changes in stress or mental health. She continues to have a good support system.     Expected Outcomes Short: Continue to comply with insomnia medication. Long: Continue to manage stress and insomnia. STG: Continue to comply with insomnia medication. LTG: Continue to manage stress and insomnia. Short: continue to attend cardiac rehab for the mental health benefits of exerise. Long: maintain independent exercise routine upon graduation from cardia  rehab.     Interventions Encouraged to attend Cardiac Rehabilitation for the exercise Encouraged to attend Cardiac Rehabilitation for the exercise  Encouraged to attend Cardiac Rehabilitation for the exercise     Continue Psychosocial Services  Follow up required by staff Follow up required by staff Follow up required by staff        Psychosocial Discharge (Final Psychosocial Re-Evaluation):  Psychosocial Re-Evaluation - 03/26/24 0955       Psychosocial Re-Evaluation   Current issues with Current Sleep Concerns    Comments [name] reports that she continues to take her medicaiton from insomnia and that it pretty much helps manage her sleep patterns. She reports no concerns or changes in stress or mental health. She continues to have a good support system.    Expected Outcomes Short: continue to attend cardiac rehab for the mental health benefits of exerise. Long: maintain independent exercise routine upon graduation from cardia  rehab.    Interventions Encouraged to attend Cardiac Rehabilitation for the exercise    Continue Psychosocial Services  Follow up required by staff          Vocational Rehabilitation: Provide vocational rehab assistance to qualifying candidates.   Vocational Rehab Evaluation & Intervention:  Vocational Rehab - 10/23/23 1310       Initial Vocational Rehab Evaluation & Intervention   Assessment shows need for Vocational Rehabilitation No          Education: Education Goals: Education classes will be provided on a variety of topics geared toward better understanding of heart health and risk factor modification. Participant will state understanding/return demonstration of topics presented as noted by education test scores.  Learning Barriers/Preferences:  Learning Barriers/Preferences - 10/23/23 1310       Learning Barriers/Preferences   Learning Barriers None    Learning Preferences None          General Cardiac Education Topics:  AED/CPR: - Group verbal and written instruction with the use of models to demonstrate the basic use of the AED with the basic ABC's of resuscitation.   Test and  Procedures: - Group verbal and visual presentation and models provide information about basic cardiac anatomy and function. Reviews the testing methods done to diagnose heart disease and the outcomes of the test results. Describes the treatment choices: Medical Management, Angioplasty, or Coronary Bypass Surgery for treating various heart conditions including Myocardial Infarction, Angina, Valve Disease, and Cardiac Arrhythmias. Written material provided at class time.   Medication Safety: - Group verbal and visual instruction to review commonly prescribed medications for heart and lung disease. Reviews the medication, class of the drug, and side effects. Includes the steps to properly store meds and maintain the prescription regimen. Written material provided at class time.   Intimacy: - Group verbal instruction through game format to discuss how heart and lung disease can affect sexual intimacy. Written material provided at class time.   Know  Your Numbers and Heart Failure: - Group verbal and visual instruction to discuss disease risk factors for cardiac and pulmonary disease and treatment options.  Reviews associated critical values for Overweight/Obesity, Hypertension, Cholesterol, and Diabetes.  Discusses basics of heart failure: signs/symptoms and treatments.  Introduces Heart Failure Zone chart for action plan for heart failure. Written material provided at class time.   Infection Prevention: - Provides verbal and written material to individual with discussion of infection control including proper hand washing and proper equipment cleaning during exercise session. Flowsheet Row Cardiac Rehab from 10/24/2023 in [facility] Cardiac and Pulmonary Rehab  Date 10/24/23  Educator NT  Instruction Review Code 1- Verbalizes Understanding    Falls Prevention: - Provides verbal and written material to individual with discussion of falls prevention and safety. Flowsheet Row Cardiac Rehab from 10/24/2023 in  [facility] Cardiac and Pulmonary Rehab  Date 10/24/23  Educator NT  Instruction Review Code 1- Verbalizes Understanding    Other: -Provides group and verbal instruction on various topics (see comments)   Knowledge Questionnaire Score:   Core Components/Risk Factors/Patient Goals at Admission:  Personal Goals and Risk Factors at Admission - 10/23/23 1305       Core Components/Risk Factors/Patient Goals on Admission    Weight Management Yes;Weight Loss    Intervention Weight Management: Develop a combined nutrition and exercise program designed to reach desired caloric intake, while maintaining appropriate intake of nutrient and fiber, sodium and fats, and appropriate energy expenditure required for the weight goal.;Weight Management: Provide education and appropriate resources to help participant work on and attain dietary goals.;Weight Management/Obesity: Establish reasonable short term and long term weight goals.    Goal Weight: Long Term 140 lb (63.5 kg)    Expected Outcomes Short Term: Continue to assess and modify interventions until short term weight is achieved;Long Term: Adherence to nutrition and physical activity/exercise program aimed toward attainment of established weight goal;Weight Loss: Understanding of general recommendations for a balanced deficit meal plan, which promotes 1-2 lb weight loss per week and includes a negative energy balance of [phone] kcal/d;Understanding recommendations for meals to include 15-35% energy as protein, 25-35% energy from fat, 35-60% energy from carbohydrates, less than 200mg  of dietary cholesterol, 20-35 gm of total fiber daily;Understanding of distribution of calorie intake throughout the day with the consumption of 4-5 meals/snacks    Diabetes Yes    Intervention Provide education about signs/symptoms and action to take for hypo/hyperglycemia.;Provide education about proper nutrition, including hydration, and aerobic/resistive exercise prescription  along with prescribed medications to achieve blood glucose in normal ranges: Fasting glucose 65-99 mg/dL    Expected Outcomes Short Term: Participant verbalizes understanding of the signs/symptoms and immediate care of hyper/hypoglycemia, proper foot care and importance of medication, aerobic/resistive exercise and nutrition plan for blood glucose control.;Long Term: Attainment of HbA1C < 7%.    Lipids Yes    Intervention Provide education and support for participant on nutrition & aerobic/resistive exercise along with prescribed medications to achieve LDL 70mg , HDL >40mg .    Expected Outcomes Short Term: Participant states understanding of desired cholesterol values and is compliant with medications prescribed. Participant is following exercise prescription and nutrition guidelines.;Long Term: Cholesterol controlled with medications as prescribed, with individualized exercise RX and with personalized nutrition plan. Value goals: LDL < 70mg , HDL > 40 mg.          Education:Diabetes - Individual verbal and written instruction to review signs/symptoms of diabetes, desired ranges of glucose level fasting, after meals and with exercise. Acknowledge that  pre and post exercise glucose checks will be done for 3 sessions at entry of program. Flowsheet Row Cardiac Rehab from 10/24/2023 in [facility] Cardiac and Pulmonary Rehab  Date 10/23/23  Educator [facility]  Instruction Review Code 1- Verbalizes Understanding    Core Components/Risk Factors/Patient Goals Review:   Goals and Risk Factor Review     Row Name 11/26/23 0947 03/12/24 0957 03/26/24 0952         Core Components/Risk Factors/Patient Goals Review   Personal Goals Review Weight Management/Obesity;Diabetes Weight Management/Obesity Lipids;Diabetes     Review [name] is still trying to lose about 10 pounds, she continues to diet and attend rehab in order to achieve her goals. She continues to check her blood sugar, but admits not everyday. Talked with  patient about the importance of checking blood sugar everyday. [name] is working on losing weight. She is eating more colorful produce and watching portions. She is also looking to do more exercise outside of rehab. [name] reports that she checks her blood sugar routinely and that it is withing good ranges. She takes all her choleterol meds and diabetes meds and follow up with her doctor regularly to manage these risk factors.     Expected Outcomes Short: Start checking blood sugar everyday. Long: Continue to exercise and manage diet in order to lose weight and keep blood sugar levels within a healthy range. STG: Follow a heart healthy diet and lose 5-10lbs. LTG: Manage risk factors independently Short: continue to check blood sugars at home and take all meds. Long: manage cardiac risk factors.        Core Components/Risk Factors/Patient Goals at Discharge (Final Review):   Goals and Risk Factor Review - 03/26/24 0952       Core Components/Risk Factors/Patient Goals Review   Personal Goals Review Lipids;Diabetes    Review [name] reports that she checks her blood sugar routinely and that it is withing good ranges. She takes all her choleterol meds and diabetes meds and follow up with her doctor regularly to manage these risk factors.    Expected Outcomes Short: continue to check blood sugars at home and take all meds. Long: manage cardiac risk factors.          ITP Comments:  ITP Comments     Row Name 10/23/23 1302 10/24/23 1126 10/29/23 0954 11/21/23 0934 12/19/23 0814   ITP Comments Initial phone call completed. Diagnosis can be found in [facility] 4/8. EP Orientation scheduled for Wednesday 5/7 9:30. Completed and gym orientation for cardiac rehab. Initial ITP created and sent for review to Dr. Oneil Pinal, Medical Director. First full day of exercise!  Patient was oriented to gym and equipment including functions, settings, policies, and procedures.  Patient's individual exercise prescription and  treatment plan were reviewed.  All starting workloads were established based on the results of the 6 minute walk test done at initial orientation visit.  The plan for exercise progression was also introduced and progression will be customized based on patient's performance and goals. 30 Day review completed. Medical Director ITP review done, changes made as directed, and signed approval by Medical Director.    new to program 30 Day review completed. Medical Director ITP review done, changes made as directed, and signed approval by Medical Director.    Row Name 01/16/24 0758 02/13/24 0827 03/12/24 1026 04/09/24 0859     ITP Comments 30 Day review completed. Medical Director ITP review done, changes made as directed, and signed approval by Medical Director. 30  Day review completed. Medical Director ITP review done, changes made as directed, and signed approval by Medical Director. 30 Day review completed. Medical Director ITP review done, changes made as directed, and signed approval by Medical Director. 30 Day review completed. Medical Director ITP review done, changes made as directed, and signed approval by Medical Director.       Comments: 30 day review

## 2024-04-11 ENCOUNTER — Encounter

## 2024-04-14 ENCOUNTER — Encounter

## 2024-04-16 ENCOUNTER — Encounter

## 2024-04-18 ENCOUNTER — Encounter

## 2024-04-18 LAB — ANALYZER(R)ANA IFA WITH REFLEX TITER/PATTRN,SYS AUTOIMM PNL1
Anti Nuclear Antibody (ANA): POSITIVE — AB
Anticardiolipin IgA: <2 [APL'U]/mL
Anticardiolipin IgG: <2 [GPL'U]/mL
Anticardiolipin IgM: <2 [MPL'U]/mL
Beta-2 Glyco 1 IgA: <2 U/mL
Beta-2 Glyco 1 IgM: <2 U/mL
Beta-2 Glyco I IgG: <2 U/mL
Centromere Ab Screen: <1 AI
Chromatin (Nucleosomal) Antibody: <1 AI
Cyclic Citrullin Peptide Ab: <16 U
DNA Ab (DS) Crithidia, IFA: NEGATIVE
ENA SM Ab Ser-aCnc: <1 AI
Jo-1 Autoabs: <1 AI
MUTATED CITRULLINATED VIMENTIN (MCV) AB: <20 U/mL (ref <20)
Ribonucleic Protein(ENA) Antibody, IgG: <1 AI
SM/RNP: <1 AI
SSA (Ro) (ENA) Antibody, IgG: <1 AI
SSB (La) (ENA) Antibody, IgG: <1 AI
Scleroderma (Scl-70) (ENA) Antibody, IgG: <1 AI
Thyroperoxidase Ab SerPl-aCnc: <1 [IU]/mL (ref <9)

## 2024-04-18 LAB — ANTI-NUCLEAR AB-TITER (ANA TITER): ANA Titer 1: 1:160 {titer} — ABNORMAL HIGH

## 2024-04-20 ENCOUNTER — Ambulatory Visit: Payer: Self-pay | Admitting: Family Medicine

## 2024-04-20 DIAGNOSIS — R7689 Other specified abnormal immunological findings in serum: Secondary | ICD-10-CM

## 2024-04-20 DIAGNOSIS — M2550 Pain in unspecified joint: Secondary | ICD-10-CM

## 2024-04-21 ENCOUNTER — Encounter: Attending: Cardiovascular Disease | Admitting: *Deleted

## 2024-04-21 ENCOUNTER — Other Ambulatory Visit

## 2024-04-21 DIAGNOSIS — R7689 Other specified abnormal immunological findings in serum: Secondary | ICD-10-CM

## 2024-04-21 DIAGNOSIS — Z955 Presence of coronary angioplasty implant and graft: Secondary | ICD-10-CM | POA: Insufficient documentation

## 2024-04-21 DIAGNOSIS — M2550 Pain in unspecified joint: Secondary | ICD-10-CM

## 2024-04-21 NOTE — Progress Notes (Signed)
 Daily Session Note  Patient Details  Name: Brandi Mahoney MRN: [id number] Date of Birth: 09-23-1955 Referring Provider:   Flowsheet Row Cardiac Rehab from 10/24/2023 in [facility] Cardiac and Pulmonary Rehab  Referring Provider Dr. Evalene Lunger, MD    Encounter Date: 04/21/2024  Check In:  Session Check In - 04/21/24 0934       Check-In   Supervising physician immediately available to respond to emergencies See telemetry face sheet for immediately available ER MD    Location [facility]-Cardiac & Pulmonary Rehab    Staff Present Othel Durand, RN, BSN, CCRP;Laura Cates RN,BSN;Joseph Sanmina-sci BS, ACSM CEP, Exercise Physiologist    Virtual Visit No    Medication changes reported     No    Fall or balance concerns reported    No    Warm-up and Cool-down Performed on first and last piece of equipment    Resistance Training Performed Yes    VAD Patient? No    PAD/SET Patient? No      Pain Assessment   Currently in Pain? No/denies             Social History   Tobacco Use  Smoking Status Former   Current packs/day: 0.00   Average packs/day: 0.4 packs/day for 35.0 years (12.3 ttl pk-yrs)   Types: Cigarettes   Start date: 09/25/1973   Quit date: 09/25/2008   Years since quitting: 15.5  Smokeless Tobacco Never  Tobacco Comments   quit in 2007-2008, social smoker    Goals Met:  Independence with exercise equipment Exercise tolerated well No report of concerns or symptoms today  Goals Unmet:  Not Applicable  Comments: Pt able to follow exercise prescription today without complaint.  Will continue to monitor for progression.    Dr. Oneil Pinal is Medical Director for [facility] Cardiac Rehabilitation.  Dr. Fuad Aleskerov is Medical Director for [facility] Pulmonary Rehabilitation.

## 2024-04-23 ENCOUNTER — Encounter

## 2024-04-23 DIAGNOSIS — Z955 Presence of coronary angioplasty implant and graft: Secondary | ICD-10-CM | POA: Diagnosis not present

## 2024-04-23 NOTE — Progress Notes (Signed)
 Daily Session Note  Patient Details  Name: MYKA HITZ MRN: [id number] Date of Birth: Dec 30, 1955 Referring Provider:   Flowsheet Row Cardiac Rehab from 10/24/2023 in [facility] Cardiac and Pulmonary Rehab  Referring Provider Dr. Evalene Lunger, MD    Encounter Date: 04/23/2024  Check In:  Session Check In - 04/23/24 0928       Check-In   Supervising physician immediately available to respond to emergencies See telemetry face sheet for immediately available ER MD    Location [facility]-Cardiac & Pulmonary Rehab    Staff Present Rollene Paterson, MS, Exercise Physiologist;Joseph Hood RCP,RRT,BSRT;Maxon Conetta BS, Exercise Physiologist;Kadden Osterhout RN,BSN    Virtual Visit No    Medication changes reported     No    Fall or balance concerns reported    No    Warm-up and Cool-down Performed on first and last piece of equipment    Resistance Training Performed Yes    VAD Patient? No    PAD/SET Patient? No      Pain Assessment   Currently in Pain? No/denies             Social History   Tobacco Use  Smoking Status Former   Current packs/day: 0.00   Average packs/day: 0.4 packs/day for 35.0 years (12.3 ttl pk-yrs)   Types: Cigarettes   Start date: 09/25/1973   Quit date: 09/25/2008   Years since quitting: 15.5  Smokeless Tobacco Never  Tobacco Comments   quit in 2007-2008, social smoker    Goals Met:  Independence with exercise equipment Exercise tolerated well No report of concerns or symptoms today Strength training completed today  Goals Unmet:  Not Applicable  Comments: Pt able to follow exercise prescription today without complaint.  Will continue to monitor for progression.    Dr. Oneil Pinal is Medical Director for [facility] Cardiac Rehabilitation.  Dr. Fuad Aleskerov is Medical Director for [facility] Pulmonary Rehabilitation.

## 2024-04-25 ENCOUNTER — Encounter

## 2024-04-28 ENCOUNTER — Encounter

## 2024-04-29 ENCOUNTER — Encounter: Payer: Self-pay | Admitting: Obstetrics & Gynecology

## 2024-04-29 ENCOUNTER — Ambulatory Visit: Admitting: Obstetrics & Gynecology

## 2024-04-29 VITALS — BP 115/74 | HR 75 | Ht 64.0 in | Wt 155.1 lb

## 2024-04-29 DIAGNOSIS — N952 Postmenopausal atrophic vaginitis: Secondary | ICD-10-CM | POA: Diagnosis not present

## 2024-04-29 DIAGNOSIS — N941 Unspecified dyspareunia: Secondary | ICD-10-CM

## 2024-04-29 DIAGNOSIS — Z803 Family history of malignant neoplasm of breast: Secondary | ICD-10-CM

## 2024-04-29 DIAGNOSIS — Z800 Family history of malignant neoplasm of digestive organs: Secondary | ICD-10-CM

## 2024-04-29 DIAGNOSIS — L900 Lichen sclerosus et atrophicus: Secondary | ICD-10-CM

## 2024-04-29 DIAGNOSIS — Z01411 Encounter for gynecological examination (general) (routine) with abnormal findings: Secondary | ICD-10-CM

## 2024-04-29 DIAGNOSIS — Z01419 Encounter for gynecological examination (general) (routine) without abnormal findings: Secondary | ICD-10-CM

## 2024-04-29 DIAGNOSIS — Z1231 Encounter for screening mammogram for malignant neoplasm of breast: Secondary | ICD-10-CM

## 2024-04-29 DIAGNOSIS — Z0000 Encounter for general adult medical examination without abnormal findings: Secondary | ICD-10-CM

## 2024-04-29 MED ORDER — ESTRADIOL 10 MCG VA TABS: ORAL_TABLET | VAGINAL | 12 refills | Status: AC

## 2024-04-29 NOTE — Patient Instructions (Signed)
 Preventive Care 83 Years and Older, Female Preventive care refers to lifestyle choices and visits with your health care provider that can promote health and wellness. Preventive care visits are also called wellness exams. What can I expect for my preventive care visit? Counseling Your health care provider may ask you questions about your: Medical history, including: Past medical problems. Family medical history. Pregnancy and menstrual history. History of falls. Current health, including: Memory and ability to understand (cognition). Emotional well-being. Home life and relationship well-being. Sexual activity and sexual health. Lifestyle, including: Alcohol, nicotine or tobacco, and drug use. Access to firearms. Diet, exercise, and sleep habits. Work and work Astronomer. Sunscreen use. Safety issues such as seatbelt and bike helmet use. Physical exam Your health care provider will check your: Height and weight. These may be used to calculate your BMI (body mass index). BMI is a measurement that tells if you are at a healthy weight. Waist circumference. This measures the distance around your waistline. This measurement also tells if you are at a healthy weight and may help predict your risk of certain diseases, such as type 2 diabetes and high blood pressure. Heart rate and blood pressure. Body temperature. Skin for abnormal spots. What immunizations do I need?  Vaccines are usually given at various ages, according to a schedule. Your health care provider will recommend vaccines for you based on your age, medical history, and lifestyle or other factors, such as travel or where you work. What tests do I need? Screening Your health care provider may recommend screening tests for certain conditions. This may include: Lipid and cholesterol levels. Hepatitis C test. Hepatitis B test. HIV (human immunodeficiency virus) test. STI (sexually transmitted infection) testing, if you are at  risk. Lung cancer screening. Colorectal cancer screening. Diabetes screening. This is done by checking your blood sugar (glucose) after you have not eaten for a while (fasting). Mammogram. Talk with your health care provider about how often you should have regular mammograms. BRCA-related cancer screening. This may be done if you have a family history of breast, ovarian, tubal, or peritoneal cancers. Bone density scan. This is done to screen for osteoporosis. Talk with your health care provider about your test results, treatment options, and if necessary, the need for more tests. Follow these instructions at home: Eating and drinking  Eat a diet that includes fresh fruits and vegetables, whole grains, lean protein, and low-fat dairy products. Limit your intake of foods with high amounts of sugar, saturated fats, and salt. Take vitamin and mineral supplements as recommended by your health care provider. Do not drink alcohol if your health care provider tells you not to drink. If you drink alcohol: Limit how much you have to 0-1 drink a day. Know how much alcohol is in your drink. In the [place] one drink equals one 12 oz bottle of beer (355 mL), one 5 oz glass of wine (148 mL), or one 1 oz glass of hard liquor (44 mL). Lifestyle Brush your teeth every morning and night with fluoride toothpaste. Floss one time each day. Exercise for at least 30 minutes 5 or more days each week. Do not use any products that contain nicotine or tobacco. These products include cigarettes, chewing tobacco, and vaping devices, such as e-cigarettes. If you need help quitting, ask your health care provider. Do not use drugs. If you are sexually active, practice safe sex. Use a condom or other form of protection in order to prevent STIs. Take aspirin only as told by  your health care provider. Make sure that you understand how much to take and what form to take. Work with your health care provider to find out whether it  is safe and beneficial for you to take aspirin daily. Ask your health care provider if you need to take a cholesterol-lowering medicine (statin). Find healthy ways to manage stress, such as: Meditation, yoga, or listening to music. Journaling. Talking to a trusted person. Spending time with friends and family. Minimize exposure to UV radiation to reduce your risk of skin cancer. Safety Always wear your seat belt while driving or riding in a vehicle. Do not drive: If you have been drinking alcohol. Do not ride with someone who has been drinking. When you are tired or distracted. While texting. If you have been using any mind-altering substances or drugs. Wear a helmet and other protective equipment during sports activities. If you have firearms in your house, make sure you follow all gun safety procedures. What's next? Visit your health care provider once a year for an annual wellness visit. Ask your health care provider how often you should have your eyes and teeth checked. Stay up to date on all vaccines. This information is not intended to replace advice given to you by your health care provider. Make sure you discuss any questions you have with your health care provider. Document Revised: 12/01/2020 Document Reviewed: 12/01/2020 Elsevier Patient Education  2024 [employer].

## 2024-04-29 NOTE — Addendum Note (Signed)
 Addended by: STARLA HARLAND BROCKS on: 04/29/2024 09:19 AM   Modules accepted: Orders

## 2024-04-29 NOTE — Progress Notes (Signed)
 ANNUAL PREVENTATIVE CARE GYNECOLOGY  ENCOUNTER NOTE  Subjective:       Brandi Mahoney is a 68 y.o. married P3 (43, 37, and 12 yo kids, 3 grands) here for a routine annual gynecologic exam. The patient is sexually active. The patient is not currently taking hormone replacement therapy. Patient denies post-menopausal vaginal bleeding. The patient wears seatbelts: yes. The patient participates in regular exercise: yes. Has the patient ever been transfused or tattooed?: yes. The patient reports that there is not domestic violence in her life.  Current complaints: 1.  She is having a LS flair, pain IC    Gynecologic History No LMP recorded. Patient has had a hysterectomy.  No h/o abnormal paps  Last Colonoscopy: 2024  FH- + colon cancer in son at age 18, no gyn, + breast in pGM and sister (dx'd around 30)  Obstetric History OB History  Gravida Para Term Preterm AB Living  4 3   1    SAB IAB Ectopic Multiple Live Births  1        # Outcome Date GA Lbr Len/2nd Weight Sex Type Anes PTL Lv  4 SAB           3 Para           2 Para           1 Para             Obstetric Comments  1st Menstrual Cycle:     1st Pregnancy:  22    Past Medical History:  Diagnosis Date   CAD (coronary artery disease)    a. 01/2010 Cath: mild atherosclerosis in the mid LCX after takeoff of OM1, mild diff LAD dzs;  b. 07/2013 MV: EF 67%, no ischemia.   GAD (generalized anxiety disorder)    History of kidney stones    HLD (hyperlipidemia)    Hypothyroidism    Major depressive disorder, recurrent episode, in full remission 09/08/2008   Qualifier: Diagnosis of  By: Copland MD, Spencer     Migraine headache    Myocarditis (HCC)    a. 01/2010 presented w/ c/p and elev trop-->Cath: mild atherosclerosis in the mid LCX after takeoff of OM1, mild diff LAD dzs;  b. 03/2010 Echo: EF 60-65%, DD, mild LVH;  c. 07/2011 Echo: EF 55-60%, mild LVH, no rwma.   Sleep apnea    has c-pap.  wears off and on - not lately   using  per reported 07-29-2020   Type 2 diabetes, diet controlled (HCC)     Family History  Problem Relation Age of Onset   Coronary artery disease Mother    Diabetes Mother    ALS Mother    Hyperlipidemia Father    Breast cancer Sister 61   Stomach cancer Maternal Grandfather    Colon cancer Maternal Grandfather        dx early 16's    Breast cancer Paternal Grandmother    Lung cancer Paternal Grandfather        CAD   Other Daughter        gluten   Colon cancer Son 41       3rd stage   Down syndrome Son    Esophageal cancer Neg Hx    Liver cancer Neg Hx    Pancreatic cancer Neg Hx    Rectal cancer Neg Hx     Past Surgical History:  Procedure Laterality Date   APPENDECTOMY  1974   BREAST SURGERY Right 1988  fatty tumore from breast   CARDIAC CATHETERIZATION  01/2010   [facility]   CHOLECYSTECTOMY  2002   COLONOSCOPY  2017   CORONARY STENT INTERVENTION N/A 09/25/2023   Procedure: CORONARY STENT INTERVENTION;  Surgeon: Jordan, Peter M, MD;  Location: [facility] INVASIVE CV LAB;  Service: Cardiovascular;  Laterality: N/A;   CYST EXCISION     ovarian cyst 1994   INSERTION OF ARTERIOVENOUS (AV) ARTEGRAFT ARM Right 09/27/2023   Procedure: 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;  Surgeon: Pearline Norman RAMAN, MD;  Location: [facility] OR;  Service: Vascular;  Laterality: Right;   LEFT HEART CATH AND CORONARY ANGIOGRAPHY N/A 09/25/2023   Procedure: LEFT HEART CATH AND CORONARY ANGIOGRAPHY;  Surgeon: Jordan, Peter M, MD;  Location: [facility] INVASIVE CV LAB;  Service: Cardiovascular;  Laterality: N/A;   NEPHROLITHOTOMY Right 08/20/2014   Procedure: RIGHT PERCUTANEOUS NEPHROLITHOTOMY ;  Surgeon: Norleen JINNY Seltzer, MD;  Location: [facility] ORS;  Service: Urology;  Laterality: Right;   POLYPECTOMY     RECTAL EXAM UNDER ANESTHESIA N/A 08/02/2020   Procedure: DONNY EXAM UNDER ANESTHESIA INJECTION OF PERIANAL BOTOX ;  Surgeon: Teresa Lonni HERO, MD;  Location: [facility];  Service: General;  Laterality: N/A;   TOTAL ABDOMINAL HYSTERECTOMY W/ BILATERAL SALPINGOOPHORECTOMY  2000   no ovaries, uterus, or cervix.    Social History   Socioeconomic History   Marital status: Married    Spouse name: Koren   Number of children: 3   Years of education: Not on file   Highest education level: Some college, no degree  Occupational History   Occupation: retired    Comment: [employer]  Tobacco Use   Smoking status: Former    Current packs/day: 0.00    Average packs/day: 0.4 packs/day for 35.0 years (12.3 ttl pk-yrs)    Types: Cigarettes    Start date: 09/25/1973    Quit date: 09/25/2008    Years since quitting: 15.6   Smokeless tobacco: Never   Tobacco comments:    quit in 2007-2008, social smoker  Vaping Use   Vaping status: Never Used  Substance and Sexual Activity   Alcohol use: Not Currently   Drug use: No   Sexual activity: Not Currently  Other Topics Concern   Not on file  Social History Narrative   Lives in [place] w/ husband.  No reg exercise.   Social Drivers of Corporate Investment Banker Strain: Low Risk  (04/02/2024)   Overall Financial Resource Strain (CARDIA)    Difficulty of Paying Living Expenses: Not very hard  Food Insecurity: No Food Insecurity (04/02/2024)   Hunger Vital Sign    Worried About Running Out of Food in the Last Year: Never true    Ran Out of Food in the Last Year: Never true  Transportation Needs: No Transportation Needs (04/02/2024)   PRAPARE - Administrator, Civil Service (Medical): No    Lack of Transportation (Non-Medical): No  Physical Activity: Insufficiently Active (04/02/2024)   Exercise Vital Sign    Days of Exercise per Week: 3 days    Minutes of Exercise per Session: 40 min  Stress: No Stress Concern Present (04/02/2024)   [employer] of Occupational Health - Occupational Stress Questionnaire    Feeling of Stress: Not at all  Social Connections: Socially Integrated  (04/02/2024)   Social Connection and Isolation Panel    Frequency of Communication with Friends and Family: More than three times a week  Frequency of Social Gatherings with Friends and Family: Once a week    Attends Religious Services: More than 4 times per year    Active Member of [employer] or Organizations: Yes    Attends Engineer, Structural: More than 4 times per year    Marital Status: Married  Catering Manager Violence: Not At Risk (03/31/2024)   Humiliation, Afraid, Rape, and Kick questionnaire    Fear of Current or Ex-Partner: No    Emotionally Abused: No    Physically Abused: No    Sexually Abused: No    Current Outpatient Medications on File Prior to Visit  Medication Sig Dispense Refill   albuterol  (VENTOLIN  HFA) 108 (90 Base) MCG/ACT inhaler Inhale 2 puffs into the lungs every 6 (six) hours as needed for wheezing or shortness of breath. 8 g 2   amLODipine  (NORVASC ) 2.5 MG tablet TAKE 1 TABLET BY MOUTH DAILY 90 tablet 3   aspirin  EC 81 MG tablet Take 81 mg by mouth daily.     clobetasol  ointment (TEMOVATE ) 0.05 % Apply 1 Application topically daily as needed (skin conditions).     fenofibrate  160 MG tablet TAKE 1 TABLET BY MOUTH DAILY 90 tablet 3   fluticasone (FLONASE) 50 MCG/ACT nasal spray Place 2 sprays into both nostrils in the morning and at bedtime.     glucose blood (ACCU-CHEK GUIDE) test strip Use to check blood sugar daily 100 each 3   HYDROcodone -acetaminophen  (NORCO/VICODIN) 5-325 MG tablet Take by mouth.     Lactobacillus-Inulin (CULTURELLE ADULT ULT BALANCE PO) Take 1 tablet by mouth daily.     levothyroxine  (SYNTHROID ) 75 MCG tablet TAKE ONE TABLET BY MOUTH EVERY MORNING BEFORE BREAKFAST 90 tablet 3   Magnesium  250 MG TABS Take 250 mg by mouth daily.     metFORMIN  (GLUCOPHAGE -XR) 500 MG 24 hr tablet Take 1,000 mg by mouth 2 (two) times daily with a meal.     Multiple Vitamins-Minerals (ONE-A-DAY WOMENS PETITES PO) Take 1 tablet by mouth daily.      nitroGLYCERIN  (NITROSTAT ) 0.4 MG SL tablet Place 1 tablet under tongue at onset of chest pain; you may repeat every 5 minutes for up to 3 doses. 25 tablet 3   pantoprazole  (PROTONIX ) 40 MG tablet Take 1 tablet (40 mg total) by mouth daily. 90 tablet 3   prasugrel  (EFFIENT ) 10 MG TABS tablet Take 1 tablet (10 mg total) by mouth daily. 30 tablet 11   rosuvastatin  (CRESTOR ) 40 MG tablet Take 1 tablet (40 mg total) by mouth daily. 90 tablet 3   sertraline  (ZOLOFT ) 50 MG tablet TAKE 1 TABLET BY MOUTH DAILY 90 tablet 0   traMADol (ULTRAM) 50 MG tablet Take 50 mg by mouth 2 (two) times daily as needed.     traZODone  (DESYREL ) 100 MG tablet TAKE 1 TABLET BY MOUTH AT BEDTIME 30 tablet 2   No current facility-administered medications on file prior to visit.    Allergies  Allergen Reactions   Cat Dander Shortness Of Breath   Pseudoeph-Doxylamine-Dm-Apap Anaphylaxis    Allergy to nyquil   Gluten Other (See Comments)    Gi upset   Penicillins Itching and Rash      Review of Systems ROS Review of Systems - General ROS: negative for - chills, fatigue, fever, hot flashes, night sweats, weight gain or weight loss Psychological ROS: negative for - anxiety, decreased libido, depression, mood swings, physical abuse or sexual abuse Ophthalmic ROS: negative for - blurry vision, eye pain or loss of  vision ENT ROS: negative for - headaches, hearing change, visual changes or vocal changes Allergy and Immunology ROS: negative for - hives, itchy/watery eyes or seasonal allergies Hematological and Lymphatic ROS: negative for - bleeding problems, bruising, swollen lymph nodes or weight loss Endocrine ROS: negative for - galactorrhea, hair pattern changes, hot flashes, malaise/lethargy, mood swings, palpitations, polydipsia/polyuria, skin changes, temperature intolerance or unexpected weight changes Breast ROS: negative for - new or changing breast lumps or nipple discharge Respiratory ROS: negative for - cough  or shortness of breath Cardiovascular ROS: negative for - chest pain, irregular heartbeat, palpitations or shortness of breath Gastrointestinal ROS: no abdominal pain, change in bowel habits, or black or bloody stools Genito-Urinary ROS: no dysuria, trouble voiding, or hematuria Musculoskeletal ROS: negative for - joint pain or joint stiffness Neurological ROS: negative for - bowel and bladder control changes Dermatological ROS: negative for rash and skin lesion changes   Objective:   BP 115/74   Pulse 75   Ht 5' 4 (1.626 m)   Wt 155 lb 1.6 oz (70.4 kg)   BMI 26.62 kg/m  CONSTITUTIONAL: Well-developed, well-nourished female in no acute distress.  PSYCHIATRIC: Normal mood and affect. Normal behavior. Normal judgment and thought content. NEUROLGIC: Alert and oriented to person, place, and time. Normal muscle tone coordination. No cranial nerve deficit noted. HENT:  Normocephalic, atraumatic, External right and left ear normal. Oropharynx is clear and moist EYES: Conjunctivae and EOM are normal. Pupils are equal, round, and reactive to light. No scleral icterus.  NECK: Normal range of motion, supple, no masses.  Normal thyroid .  SKIN: Skin is warm and dry. No rash noted. Not diaphoretic. No erythema. No pallor. CARDIOVASCULAR: Normal heart rate noted, regular rhythm, no murmur. RESPIRATORY: Clear to auscultation bilaterally. Effort and breath sounds normal, no problems with respiration noted. BREASTS: Symmetric in size. No masses, skin changes, nipple drainage, or lymphadenopathy. ABDOMEN: Soft, normal bowel sounds, no distention noted.  No tenderness, rebound or guarding.  EG- c/w LS, VVA Vagina- severe VVA normal size and shape, anteverted uterus, normal adnexal exam  MUSCULOSKELETAL: Normal range of motion. No tenderness.  No cyanosis, clubbing, or edema.  2+ distal pulses. LYMPHATIC: No Axillary, Supraclavicular, or Inguinal Adenopathy.   Labs: Lab Results  Component Value Date    WBC 6.9 03/31/2024   HGB 11.8 (L) 03/31/2024   HCT 37.5 03/31/2024   MCV 82.3 03/31/2024   PLT 270.0 03/31/2024    Lab Results  Component Value Date   CREATININE 0.70 03/31/2024   BUN 17 03/31/2024   NA 141 03/31/2024   K 4.1 03/31/2024   CL 104 03/31/2024   CO2 30 03/31/2024    Lab Results  Component Value Date   ALT 18 03/31/2024   AST 21 03/31/2024   ALKPHOS 58 03/31/2024   BILITOT 0.3 03/31/2024    Lab Results  Component Value Date   CHOL 94 03/31/2024   HDL 43.30 03/31/2024   LDLCALC 20 03/31/2024   LDLDIRECT 19 11/30/2023   TRIG 152.0 (H) 03/31/2024   CHOLHDL 2 03/31/2024    Lab Results  Component Value Date   TSH 4.09 03/31/2024    Lab Results  Component Value Date   HGBA1C 7.0 (H) 03/31/2024     Assessment:   Well woman exam FH of colon and breast cancer Dyspareunia with severe VVA Lichen sclerosis  Plan:  Pap: not indicated Mammogram: ordered Labs: genetic screen Rec clobetasol  at least 2-3 times per week continually Start Imvexxy  2 nights per  week Rec SVE due to increased risk of vulvar cancer Return to Clinic - 1 Year   Harland JAYSON Birkenhead, MD [facility]

## 2024-04-30 ENCOUNTER — Encounter: Admitting: Emergency Medicine

## 2024-04-30 DIAGNOSIS — Z955 Presence of coronary angioplasty implant and graft: Secondary | ICD-10-CM

## 2024-04-30 NOTE — Progress Notes (Signed)
 Daily Session Note  Patient Details  Name: Brandi Mahoney MRN: [id number] Date of Birth: February 14, 1956 Referring Provider:   Flowsheet Row Cardiac Rehab from 10/24/2023 in [facility] Cardiac and Pulmonary Rehab  Referring Provider Dr. Evalene Lunger, MD    Encounter Date: 04/30/2024  Check In:  Session Check In - 04/30/24 1010       Check-In   Supervising physician immediately available to respond to emergencies See telemetry face sheet for immediately available ER MD    Location [facility]-Cardiac & Pulmonary Rehab    Staff Present Maxon Burnell HECKLE, Exercise Physiologist;Sharece Fleischhacker RN,BSN;Kelly Dyane BS, ACSM CEP, Exercise Physiologist;Margaret Best, MS, Exercise Physiologist    Virtual Visit No    Medication changes reported     No    Fall or balance concerns reported    No    Warm-up and Cool-down Performed on first and last piece of equipment    Resistance Training Performed Yes    VAD Patient? No    PAD/SET Patient? No      Pain Assessment   Currently in Pain? No/denies             Social History   Tobacco Use  Smoking Status Former   Current packs/day: 0.00   Average packs/day: 0.4 packs/day for 35.0 years (12.3 ttl pk-yrs)   Types: Cigarettes   Start date: 09/25/1973   Quit date: 09/25/2008   Years since quitting: 15.6  Smokeless Tobacco Never  Tobacco Comments   quit in 2007-2008, social smoker    Goals Met:  Independence with exercise equipment Exercise tolerated well No report of concerns or symptoms today Strength training completed today  Goals Unmet:  Not Applicable  Comments: Pt able to follow exercise prescription today without complaint.  Will continue to monitor for progression.    Dr. Oneil Pinal is Medical Director for [facility] Cardiac Rehabilitation.  Dr. Fuad Aleskerov is Medical Director for [facility] Pulmonary Rehabilitation.

## 2024-05-01 ENCOUNTER — Other Ambulatory Visit: Payer: Self-pay | Admitting: Family Medicine

## 2024-05-01 LAB — ANALYZER(R)ANA IFA WITH REFLEX TITER/PATTRN,SYS AUTOIMM PNL1
Anti Nuclear Antibody (ANA): POSITIVE — AB
Anticardiolipin IgA: <2 [APL'U]/mL
Anticardiolipin IgG: <2 [GPL'U]/mL
Anticardiolipin IgM: <2 [MPL'U]/mL
Beta-2 Glyco 1 IgA: <2 U/mL
Beta-2 Glyco 1 IgM: <2 U/mL
Beta-2 Glyco I IgG: <2 U/mL
C3 Complement: 153 mg/dL (ref 83–193)
C4 Complement: 24 mg/dL (ref 15–57)
Centromere Ab Screen: <1 AI
Chromatin (Nucleosomal) Antibody: <1 AI
Cyclic Citrullin Peptide Ab: <16 U
DNA Ab (DS) Crithidia, IFA: NEGATIVE
ENA SM Ab Ser-aCnc: <1 AI
Jo-1 Autoabs: <1 AI
MUTATED CITRULLINATED VIMENTIN (MCV) AB: <20 U/mL (ref <20)
Rheumatoid Factor (IgA): <5 U
Rheumatoid Factor (IgG): <5 U
Rheumatoid Factor (IgM): <5 U
Ribonucleic Protein(ENA) Antibody, IgG: <1 AI
SM/RNP: <1 AI
SSA (Ro) (ENA) Antibody, IgG: <1 AI
SSB (La) (ENA) Antibody, IgG: <1 AI
Scleroderma (Scl-70) (ENA) Antibody, IgG: <1 AI
Thyroperoxidase Ab SerPl-aCnc: <1 [IU]/mL (ref <9)

## 2024-05-01 LAB — ANTI-NUCLEAR AB-TITER (ANA TITER): ANA Titer 1: 1:80 {titer} — ABNORMAL HIGH

## 2024-05-02 ENCOUNTER — Encounter

## 2024-05-02 DIAGNOSIS — Z955 Presence of coronary angioplasty implant and graft: Secondary | ICD-10-CM

## 2024-05-02 NOTE — Progress Notes (Signed)
 Daily Session Note  Patient Details  Name: Brandi Mahoney MRN: [id number] Date of Birth: [birth year] Referring Provider:   Flowsheet Row Cardiac Rehab from 10/24/2023 in [facility] Cardiac and Pulmonary Rehab  Referring Provider Dr. Evalene Lunger, MD    Encounter Date: 05/02/2024  Check In:  Session Check In - 05/02/24 0919       Check-In   Supervising physician immediately available to respond to emergencies See telemetry face sheet for immediately available ER MD    Location [facility]-Cardiac & Pulmonary Rehab    Staff Present Burnard Davenport RN,BSN,MPA;Meredith Tressa RN,BSN;Joseph Rolinda RCP,RRT,BSRT;Noah Tickle, [place], Exercise Physiologist    Virtual Visit No    Medication changes reported     No    Fall or balance concerns reported    No    Warm-up and Cool-down Performed on first and last piece of equipment    Resistance Training Performed Yes    VAD Patient? No    PAD/SET Patient? No      Pain Assessment   Currently in Pain? No/denies             Social History   Tobacco Use  Smoking Status Former   Current packs/day: 0.00   Average packs/day: 0.4 packs/day for 35.0 years (12.3 ttl pk-yrs)   Types: Cigarettes   Start date: 09/25/1973   Quit date: 09/25/2008   Years since quitting: 15.6  Smokeless Tobacco Never  Tobacco Comments   quit in 2007-2008, social smoker    Goals Met:  Proper associated with RPD/PD & O2 Sat Independence with exercise equipment Exercise tolerated well No report of concerns or symptoms today Strength training completed today  Goals Unmet:  Not Applicable  Comments: Pt able to follow exercise prescription today without complaint.  Will continue to monitor for progression.    Dr. Oneil Pinal is Medical Director for [facility] Cardiac Rehabilitation.  Dr. Fuad Aleskerov is Medical Director for [facility] Pulmonary Rehabilitation.

## 2024-05-04 NOTE — Progress Notes (Deleted)
 Cardiology Office Note    Date:  05/04/2024   ID:  Brandi Mahoney, DOB 1956-06-16, MRN [id number]  PCP:  Watt Mirza, MD  Cardiologist:  Evalene Lunger, MD  Electrophysiologist:  None   Chief Complaint: Follow-up  History of Present Illness:   Brandi Mahoney is a 68 y.o. 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 01/2010 Cath: mild atherosclerosis in the mid LCX after takeoff of OM1, mild diff LAD dzs;  b. 07/2013 MV: EF 67%, no ischemia.   GAD (generalized anxiety disorder)    History of kidney stones    HLD (hyperlipidemia)    Hypothyroidism    Major depressive disorder, recurrent episode, in full remission 09/08/2008   Qualifier: Diagnosis of  By: Copland MD, Spencer     Migraine headache    Myocarditis (HCC)    a. 01/2010 presented w/ c/p and elev trop-->Cath: mild atherosclerosis in the mid LCX after takeoff of OM1, mild diff LAD dzs;  b. 03/2010 Echo: EF 60-65%, DD, mild LVH;  c. 07/2011 Echo: EF 55-60%, mild LVH, no rwma.   Sleep apnea    has c-pap.  wears off and on - not lately  using  per reported 07-29-2020   Type 2 diabetes, diet controlled [facility])     Past Surgical History:  Procedure Laterality Date   APPENDECTOMY  1974   BREAST SURGERY Right 1988   fatty tumore from breast   CARDIAC CATHETERIZATION  01/2010   [facility],   CHOLECYSTECTOMY  2002   COLONOSCOPY  2017   CORONARY STENT INTERVENTION N/A 09/25/2023   Procedure: CORONARY STENT INTERVENTION;  Surgeon: Jordan, Peter M, MD;  Location: [facility]  INVASIVE CV LAB;  Service: Cardiovascular;  Laterality: N/A;   CYST EXCISION     ovarian cyst 1994   INSERTION OF ARTERIOVENOUS (AV) ARTEGRAFT ARM Right 09/27/2023   Procedure: 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;  Surgeon: Pearline Norman RAMAN, MD;  Location: [facility] OR;  Service: Vascular;  Laterality: Right;   LEFT HEART CATH AND CORONARY ANGIOGRAPHY N/A 09/25/2023   Procedure: LEFT HEART CATH AND CORONARY ANGIOGRAPHY;  Surgeon: Jordan, Peter M, MD;  Location: [facility] INVASIVE CV LAB;  Service: Cardiovascular;  Laterality: N/A;   NEPHROLITHOTOMY Right 08/20/2014   Procedure: RIGHT PERCUTANEOUS NEPHROLITHOTOMY ;  Surgeon: Norleen JINNY Seltzer, MD;  Location: [facility] ORS;  Service: Urology;  Laterality: Right;   POLYPECTOMY     RECTAL EXAM UNDER ANESTHESIA N/A 08/02/2020   Procedure: DONNY CORPUS UNDER ANESTHESIA INJECTION OF PERIANAL BOTOX ;  Surgeon: Teresa Lonni HERO, MD;  Location: [facility];  Service: General;  Laterality: N/A;   TOTAL ABDOMINAL HYSTERECTOMY W/ BILATERAL SALPINGOOPHORECTOMY  2000   no ovaries, uterus, or cervix.    Current Medications: No outpatient medications have been marked as taking for the 05/06/24 encounter (Appointment) with Abigail Bernardino HERO, PA-C.    Allergies:   Cat dander, Pseudoeph-doxylamine-dm-apap, Gluten, and Penicillins   Social History   Socioeconomic History   Marital status: Married  Spouse name: Koren   Number of children: 3   Years of education: Not on file   Highest education level: Some college, no degree  Occupational History   Occupation: retired    Comment: [employer]  Tobacco Use   Smoking status: Former    Current packs/day: 0.00    Average packs/day: 0.4 packs/day for 35.0 years (12.3 ttl pk-yrs)    Types: Cigarettes    Start date: 09/25/1973    Quit date: 09/25/2008    Years since quitting: 15.6   Smokeless tobacco: Never   Tobacco comments:    quit in 2007-2008, social smoker  Vaping Use   Vaping status: Never Used   Substance and Sexual Activity   Alcohol use: Not Currently   Drug use: No   Sexual activity: Not Currently  Other Topics Concern   Not on file  Social History Narrative   Lives in [place] w/ husband.  No reg exercise.   Social Drivers of Corporate Investment Banker Strain: Low Risk  (04/02/2024)   Overall Financial Resource Strain (CARDIA)    Difficulty of Paying Living Expenses: Not very hard  Food Insecurity: No Food Insecurity (04/02/2024)   Hunger Vital Sign    Worried About Running Out of Food in the Last Year: Never true    Ran Out of Food in the Last Year: Never true  Transportation Needs: No Transportation Needs (04/02/2024)   PRAPARE - Administrator, Civil Service (Medical): No    Lack of Transportation (Non-Medical): No  Physical Activity: Insufficiently Active (04/02/2024)   Exercise Vital Sign    Days of Exercise per Week: 3 days    Minutes of Exercise per Session: 40 min  Stress: No Stress Concern Present (04/02/2024)   [employer] of Occupational Health - Occupational Stress Questionnaire    Feeling of Stress: Not at all  Social Connections: Socially Integrated (04/02/2024)   Social Connection and Isolation Panel    Frequency of Communication with Friends and Family: More than three times a week    Frequency of Social Gatherings with Friends and Family: Once a week    Attends Religious Services: More than 4 times per year    Active Member of [employer] or Organizations: Yes    Attends Engineer, Structural: More than 4 times per year    Marital Status: Married     Family History:  The patient's family history includes ALS in her mother; Breast cancer in her paternal grandmother; Breast cancer (age of onset: 84) in her sister; Colon cancer in her maternal grandfather; Colon cancer (age of onset: 11) in her son; Coronary artery disease in her mother; Diabetes in her mother; Down syndrome in her son; Hyperlipidemia in her father; Lung  cancer in her paternal grandfather; Other in her daughter; Stomach cancer in her maternal grandfather. There is no history of Esophageal cancer, Liver cancer, Pancreatic cancer, or Rectal cancer.  ROS:   12-point review of systems is negative unless otherwise noted in the HPI.   EKGs/Labs/Other Studies Reviewed:    Studies reviewed were summarized above. The additional studies were reviewed today:  2D echo 10/23/2023: 1. Left ventricular ejection fraction, by estimation, is 55 to 60%. Left  ventricular ejection fraction by 3D volume is 63 %. The left ventricle has  normal function. The left ventricle has no regional wall motion  abnormalities. Left ventricular diastolic   parameters are consistent with Grade I diastolic dysfunction (impaired  relaxation).   2.  Right ventricular systolic function is normal. The right ventricular  size is normal. There is normal pulmonary artery systolic pressure. The  estimated right ventricular systolic pressure is 33.0 mmHg.   3. Left atrial size was mild to moderately dilated.   4. The mitral valve is normal in structure. Moderate mitral valve  regurgitation. No evidence of mitral stenosis.   5. The aortic valve has an indeterminant number of cusps. Aortic valve  regurgitation is not visualized. No aortic stenosis is present.   6. The inferior vena cava is dilated in size with >50% respiratory  variability, suggesting right atrial pressure of 8 mmHg.  __________   LHC 09/25/2023: Diagnostic Dominance: Right Left Main  Vessel was injected. Vessel is normal in caliber. Vessel is angiographically normal.    Left Anterior Descending  Prox LAD lesion is 85% stenosed. The lesion is focal.    Left Circumflex  Vessel was injected. Vessel is normal in caliber. The vessel exhibits minimal luminal irregularities.    Right Coronary Artery  Vessel was injected. Vessel is large. Vessel is angiographically normal.          Single vessel obstructive CAD  involving the proximal LAD Normal LV function Normal LVEDP Successful PCI of the proximal LAD with DES x 1   Plan: DAPT for one year. Anticipate same day DC __________   Lexiscan  MPI 11/08/2018: Pharmacological myocardial perfusion imaging study with no significant  Ischemia Breast attenuation artifact noted Normal wall motion, EF could not be estimated secondary to technical issues No EKG changes concerning for ischemia at peak stress or in recovery. Low risk scan __________   2D echo 10/05/2017: - Left ventricle: The cavity size was normal. There was mild    concentric hypertrophy. Systolic function was vigorous. The    estimated ejection fraction was in the range of 65% to 70%. Wall    motion was normal; there were no regional wall motion    abnormalities. Doppler parameters are consistent with abnormal    left ventricular relaxation (grade 1 diastolic dysfunction).  __________   See CV Studies in Epic for more remote cardiac imaging   EKG:  EKG is ordered today.  The EKG ordered today demonstrates ***  Recent Labs: 03/31/2024: ALT 18; BUN 17; Creatinine, Ser 0.70; Hemoglobin 11.8; Platelets 270.0; Potassium 4.1; Sodium 141; TSH 4.09  Recent Lipid Panel    Component Value Date/Time   CHOL 94 03/31/2024 1117   CHOL 80 (L) 11/30/2023 0903   CHOL 151 10/17/2011 0826   TRIG 152.0 (H) 03/31/2024 1117   TRIG 136 10/17/2011 0826   HDL 43.30 03/31/2024 1117   HDL 36 (L) 11/30/2023 0903   HDL 44 10/17/2011 0826   CHOLHDL 2 03/31/2024 1117   VLDL 30.4 03/31/2024 1117   VLDL 27 10/17/2011 0826   LDLCALC 20 03/31/2024 1117   LDLCALC 21 11/30/2023 0903   LDLCALC 80 10/17/2011 0826   LDLDIRECT 19 11/30/2023 0903   LDLDIRECT 62.0 11/22/2022 1558    PHYSICAL EXAM:    VS:  There were no vitals taken for this visit.  BMI: There is no height or weight on file to calculate BMI.  Physical Exam  Wt Readings from Last 3 Encounters:  04/29/24 155 lb 1.6 oz (70.4 kg)  04/02/24  154 lb 2 oz (69.9 kg)  03/31/24 156 lb (70.8 kg)     ASSESSMENT & PLAN:   CAD involving the native coronary arteries with ***:  HLD/hypertriglyceridemia: LDL 20 with triglyceride 152 in 03/2024.  Radial artery pseudoaneurysm:   {Are you ordering a CV Procedure (e.g. stress test, cath, DCCV, TEE, etc)?   Press F2        :[id number]}     Disposition: F/u with Dr. Gollan or an APP in ***.   Medication Adjustments/Labs and Tests Ordered: Current medicines are reviewed at length with the patient today.  Concerns regarding medicines are outlined above. Medication changes, Labs and Tests ordered today are summarized above and listed in the Patient Instructions accessible in Encounters.   Signed, Bernardino Bring, PA-C 05/04/2024 9:34 AM     [facility] [address] [place], [place] [address] [phone]

## 2024-05-05 ENCOUNTER — Ambulatory Visit: Payer: Self-pay

## 2024-05-05 ENCOUNTER — Ambulatory Visit (INDEPENDENT_AMBULATORY_CARE_PROVIDER_SITE_OTHER): Admitting: Family Medicine

## 2024-05-05 ENCOUNTER — Encounter: Payer: Self-pay | Admitting: Family Medicine

## 2024-05-05 ENCOUNTER — Encounter

## 2024-05-05 VITALS — BP 110/72 | HR 100 | Temp 98.8°F | Resp 18 | Ht 64.0 in | Wt 152.3 lb

## 2024-05-05 DIAGNOSIS — R058 Other specified cough: Secondary | ICD-10-CM | POA: Diagnosis not present

## 2024-05-05 DIAGNOSIS — R110 Nausea: Secondary | ICD-10-CM

## 2024-05-05 DIAGNOSIS — J029 Acute pharyngitis, unspecified: Secondary | ICD-10-CM | POA: Diagnosis not present

## 2024-05-05 LAB — CBC WITH DIFFERENTIAL/PLATELET
Absolute Lymphocytes: 1476 {cells}/uL (ref 850–3900)
Absolute Monocytes: 836 {cells}/uL (ref 200–950)
Basophils Absolute: 31 {cells}/uL (ref 0–200)
Basophils Relative: 0.5 %
Eosinophils Absolute: 31 {cells}/uL (ref 15–500)
Eosinophils Relative: 0.5 %
HCT: 39.3 % (ref 35.0–45.0)
Hemoglobin: 12.6 g/dL (ref 11.7–15.5)
MCH: 26.3 pg — ABNORMAL LOW (ref 27.0–33.0)
MCHC: 32.1 g/dL (ref 32.0–36.0)
MCV: 81.9 fL (ref 80.0–100.0)
MPV: 11.9 fL (ref 7.5–12.5)
Monocytes Relative: 13.7 %
Neutro Abs: 3727 {cells}/uL (ref 1500–7800)
Neutrophils Relative %: 61.1 %
Platelets: 259 Thousand/uL (ref 140–400)
RBC: 4.8 Million/uL (ref 3.80–5.10)
RDW: 13.3 % (ref 11.0–15.0)
Total Lymphocyte: 24.2 %
WBC: 6.1 Thousand/uL (ref 3.8–10.8)

## 2024-05-05 LAB — POC COVID19/FLU A&B COMBO
Covid Antigen, POC: NEGATIVE
Influenza A Antigen, POC: NEGATIVE
Influenza B Antigen, POC: NEGATIVE

## 2024-05-05 LAB — BASIC METABOLIC PANEL WITH GFR
BUN: 13 mg/dL (ref 7–25)
CO2: 29 mmol/L (ref 20–32)
Calcium: 9.9 mg/dL (ref 8.6–10.4)
Chloride: 101 mmol/L (ref 98–110)
Creat: 0.88 mg/dL (ref 0.50–1.05)
Glucose, Bld: 102 mg/dL — ABNORMAL HIGH (ref 65–99)
Potassium: 4.5 mmol/L (ref 3.5–5.3)
Sodium: 138 mmol/L (ref 135–146)
eGFR: 72 mL/min/1.73m2 (ref >=60)

## 2024-05-05 LAB — POCT RAPID STREP A (OFFICE): Rapid Strep A Screen: NEGATIVE

## 2024-05-05 MED ORDER — TRELEGY ELLIPTA 200-62.5-25 MCG/ACT IN AEPB: 1.0000 | INHALATION_SPRAY | Freq: Every day | RESPIRATORY_TRACT | 0 refills | Status: DC

## 2024-05-05 MED ORDER — BENZONATATE 100 MG PO CAPS: 100.0000 mg | ORAL_CAPSULE | Freq: Three times a day (TID) | ORAL | 0 refills | Status: AC | PRN

## 2024-05-05 MED ORDER — TRELEGY ELLIPTA 200-62.5-25 MCG/ACT IN AEPB: 1.0000 | INHALATION_SPRAY | Freq: Every day | RESPIRATORY_TRACT | Status: AC

## 2024-05-05 MED ORDER — ONDANSETRON HCL 4 MG PO TABS: 4.0000 mg | ORAL_TABLET | Freq: Three times a day (TID) | ORAL | 0 refills | Status: AC | PRN

## 2024-05-05 NOTE — Telephone Encounter (Signed)
 FYI Only or Action Required?: Action required by provider: request for appointment.  Patient was last seen in primary care on 04/02/2024 by Watt Mirza, MD.  Called Nurse Triage reporting Cough.  Symptoms began several days ago.  Interventions attempted: Rest, hydration, or home remedies.  Symptoms are: gradually worsening. Cough, fever, sore throat, diarrhea.  Triage Disposition: See Physician Within 24 Hours  Patient/caregiver understands and will follow disposition?: Yes      Copied from CRM #[id number]. Topic: Clinical - Red Word Triage >> May 05, 2024  9:02 AM Darshell M wrote: Red Word that prompted transfer to Nurse Triage: Husband, Koren [phone]), calling in for [facility]. Patient has laryngitis, high fever, sore throat, bad cough, nausea, diarrhea, dry heaves for approximately 3 days. Reason for Disposition  [1] Continuous (nonstop) coughing interferes with work or school AND [2] no improvement using cough treatment per Care Advice  Answer Assessment - Initial Assessment Questions 1. ONSET: When did the cough begin?      3 days 2. SEVERITY: How bad is the cough today?      severe 3. SPUTUM: Describe the color of your sputum (e.g., none, dry cough; clear, white, yellow, green)     none 4. HEMOPTYSIS: Are you coughing up any blood? If Yes, ask: How much? (e.g., flecks, streaks, tablespoons, etc.)     no 5. DIFFICULTY BREATHING: Are you having difficulty breathing? If Yes, ask: How bad is it? (e.g., mild, moderate, severe)      no 6. FEVER: Do you have a fever? If Yes, ask: What is your temperature, how was it measured, and when did it start?     101.5 7. CARDIAC HISTORY: Do you have any history of heart disease? (e.g., heart attack, congestive heart failure)      stent 8. LUNG HISTORY: Do you have any history of lung disease?  (e.g., pulmonary embolus, asthma, emphysema)     no 9. PE RISK FACTORS: Do you have a history of blood clots? (or:  recent major surgery, recent prolonged travel, bedridden)     no 10. OTHER SYMPTOMS: Do you have any other symptoms? (e.g., runny nose, wheezing, chest pain)       Nausea,diarrhea, sore throat 11. PREGNANCY: Is there any chance you are pregnant? When was your last menstrual period?       no 12. TRAVEL: Have you traveled out of the country in the last month? (e.g., travel history, exposures)       no  Protocols used: Cough - Acute Productive-A-AH

## 2024-05-05 NOTE — Progress Notes (Signed)
 Name: Brandi Mahoney   MRN: [id number]    DOB: 04/09/1956   Date:05/05/2024       Progress Note  Subjective  Chief Complaint  Chief Complaint  Patient presents with   Cough    Patient has laryngitis, high fever, sore throat, bad cough, nausea, diarrhea, dry heaves for approximately 3 days.   Fever   Sore Throat    Pt son has strep    Discussed the use of AI scribe software for clinical note transcription with the patient, who gave verbal consent to proceed.  History of Present Illness Brandi Mahoney is a 68 year old female with asthma and type 2 diabetes who presents with a sore throat and cough.  She has been experiencing a sore throat and a severe cough since Friday, with symptoms worsening over the weekend. The sore throat and cough began almost simultaneously, with the cough possibly starting slightly earlier. She has had a high fever, reaching 102.50F, and describes the cough as dry with no productive sputum. No significant nasal drainage is noted, though she occasionally blows her nose. She also reports hoarseness and laryngitis.  She has a long-standing history of asthma, diagnosed approximately 30 years ago, but she does not currently take medication for it. She experiences chronic shortness of breath, which she attributes to her asthma. She reports that she has had pulmonary evaluations, including a CT scan and pulmonary function tests, but was told the results were fine. She has a history of smoking but quit a long time ago. Despite being in cardiac rehab since having a stent placed in April, her shortness of breath persists, especially with physical activity.  She has type 2 diabetes and heart disease, having undergone a heart catheterization and stent placement in April. No congestive heart failure. She has been evaluated by a pulmonologist for a lung nodule, but no interstitial lung disease was found.  She reports nausea and requests medication for it, describing  'terrible dryness' and queasiness without chest pain. She prefers Zofran  for nausea relief.  She mentions having access to a Trelegy inhaler sample.    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 01/27/2010   HYPERTRIGLYCERIDEMIA 07/21/2009   Hypothyroidism in adult 09/08/2008   Major depressive disorder, recurrent episode, in full remission 09/08/2008   Asthma 09/08/2008    Social History   Tobacco Use   Smoking status: Former    Current packs/day: 0.00    Average packs/day: 0.4 packs/day for 35.0 years (12.3 ttl pk-yrs)    Types: Cigarettes    Start date: 09/25/1973    Quit date: 09/25/2008    Years since quitting: 15.6   Smokeless tobacco: Never   Tobacco comments:    quit in 2007-2008, social smoker  Substance Use Topics   Alcohol use: Not Currently     Current Outpatient Medications:    albuterol  (VENTOLIN  HFA) 108 (90 Base) MCG/ACT inhaler, Inhale 2 puffs into the lungs every 6 (six) hours as needed for wheezing or shortness of breath., Disp: 8 g,  Rfl: 2   amLODipine  (NORVASC ) 2.5 MG tablet, TAKE 1 TABLET BY MOUTH DAILY, Disp: 90 tablet, Rfl: 3   aspirin  EC 81 MG tablet, Take 81 mg by mouth daily., Disp: , Rfl:    clobetasol  ointment (TEMOVATE ) 0.05 %, Apply 1 Application topically daily as needed (skin conditions)., Disp: , Rfl:    Estradiol  10 MCG TABS vaginal tablet, Place 1 tablet per vagina at night 2 nights per week., Disp: 30 tablet, Rfl: 12   fenofibrate  160 MG tablet, TAKE 1 TABLET BY MOUTH  DAILY, Disp: 90 tablet, Rfl: 3   fluticasone (FLONASE) 50 MCG/ACT nasal spray, Place 2 sprays into both nostrils in the morning and at bedtime., Disp: , Rfl:    glucose blood (ACCU-CHEK GUIDE) test strip, Use to check blood sugar daily, Disp: 100 each, Rfl: 3   HYDROcodone -acetaminophen  (NORCO/VICODIN) 5-325 MG tablet, Take by mouth., Disp: , Rfl:    Lactobacillus-Inulin (CULTURELLE ADULT ULT BALANCE PO), Take 1 tablet by mouth daily., Disp: , Rfl:    levothyroxine  (SYNTHROID ) 75 MCG tablet, TAKE ONE TABLET BY MOUTH EVERY MORNING BEFORE BREAKFAST, Disp: 90 tablet, Rfl: 3   Magnesium  250 MG TABS, Take 250 mg by mouth daily., Disp: , Rfl:    metFORMIN  (GLUCOPHAGE -XR) 500 MG 24 hr tablet, Take 1,000 mg by mouth 2 (two) times daily with a meal., Disp: , Rfl:    Multiple Vitamins-Minerals (ONE-A-DAY WOMENS PETITES PO), Take 1 tablet by mouth daily., Disp: , Rfl:    nitroGLYCERIN  (NITROSTAT ) 0.4 MG SL tablet, Place 1 tablet under tongue at onset of chest pain; you may repeat every 5 minutes for up to 3 doses., Disp: 25 tablet, Rfl: 3   pantoprazole  (PROTONIX ) 40 MG tablet, Take 1 tablet (40 mg total) by mouth daily., Disp: 90 tablet, Rfl: 3   prasugrel  (EFFIENT ) 10 MG TABS tablet, Take 1 tablet (10 mg total) by mouth daily., Disp: 30 tablet, Rfl: 11   rosuvastatin  (CRESTOR ) 40 MG tablet, Take 1 tablet (40 mg total) by mouth daily., Disp: 90 tablet, Rfl: 3   sertraline  (ZOLOFT ) 50 MG tablet, TAKE 1 TABLET BY MOUTH DAILY, Disp: 90 tablet, Rfl: 0   traMADol (ULTRAM) 50 MG tablet, Take 50 mg by mouth 2 (two) times daily as needed., Disp: , Rfl:    traZODone  (DESYREL ) 100 MG tablet, TAKE 1 TABLET BY MOUTH AT BEDTIME, Disp: 30 tablet, Rfl: 2  Allergies  Allergen Reactions   Cat Dander Shortness Of Breath   Pseudoeph-Doxylamine-Dm-Apap Anaphylaxis    Allergy to nyquil   Gluten Other (See Comments)    Gi upset   Penicillins Itching and Rash    ROS  Ten systems reviewed and is negative except as  mentioned in HPI    Objective  Vitals:   05/05/24 1327  BP: 110/72  Pulse: 100  Resp: 18  Temp: 98.8 F (37.1 C)  TempSrc: Oral  SpO2: 94%  Weight: 152 lb 4.8 oz (69.1 kg)  Height: 5' 4 (1.626 m)    Body mass index is 26.14 kg/m.  Physical Exam CONSTITUTIONAL: Patient appears well-developed and well-nourished. No distress. HEENT: Head atraumatic, normocephalic, neck supple. Throat mildly erythematous. CARDIOVASCULAR: Normal rate, regular rhythm and normal heart sounds. No murmur heard. No BLE edema. PULMONARY: Effort normal and breath sounds normal. Lungs clear to auscultation. No respiratory distress. ABDOMINAL: There is no tenderness or distention. MUSCULOSKELETAL: Normal gait. Without gross motor or sensory deficit. PSYCHIATRIC: Patient has a normal mood and affect. Behavior is normal. Judgment and thought  content normal.  Recent Results (from the past 2160 hours)  Microalbumin / creatinine urine ratio     Status: None   Collection Time: 03/31/24 11:17 AM  Result Value Ref Range   Microalb, Ur 1.4 0.0 - 1.9 mg/dL   Creatinine,U 41.9 mg/dL   Microalb Creat Ratio 23.6 0.0 - 30.0 mg/g  VITAMIN D  25 Hydroxy (Vit-D Deficiency, Fractures)     Status: None   Collection Time: 03/31/24 11:17 AM  Result Value Ref Range   VITD 32.97 30.00 - 100.00 ng/mL  T4, free     Status: None   Collection Time: 03/31/24 11:17 AM  Result Value Ref Range   Free T4 0.84 0.60 - 1.60 ng/dL    Comment: Specimens from patients who are undergoing biotin therapy and /or ingesting biotin supplements may contain high levels of biotin.  The higher biotin concentration in these specimens interferes with this Free T4 assay.  Specimens that contain high levels  of biotin may cause false high results for this Free T4 assay.  Please interpret results in light of the total clinical presentation of the patient.    T3, free     Status: None   Collection Time: 03/31/24 11:17 AM  Result Value Ref Range   T3,  Free 3.4 2.3 - 4.2 pg/mL  TSH     Status: None   Collection Time: 03/31/24 11:17 AM  Result Value Ref Range   TSH 4.09 0.35 - 5.50 uIU/mL  Lipid panel     Status: Abnormal   Collection Time: 03/31/24 11:17 AM  Result Value Ref Range   Cholesterol 94 0 - 200 mg/dL    Comment: ATP III Classification       Desirable:  < 200 mg/dL               Borderline High:  200 - 239 mg/dL          High:  > = 759 mg/dL   Triglycerides 847.9 (H) 0.0 - 149.0 mg/dL    Comment: Normal:  <849 mg/dLBorderline High:  150 - 199 mg/dL   HDL 56.69 >60.99 mg/dL   VLDL 69.5 0.0 - 59.9 mg/dL   LDL Cholesterol 20 0 - 99 mg/dL   Total CHOL/HDL Ratio 2     Comment:                Men          Women1/2 Average Risk     3.4          3.3Average Risk          5.0          4.42X Average Risk          9.6          7.13X Average Risk          15.0          11.0                       NonHDL 50.62     Comment: NOTE:  Non-HDL goal should be 30 mg/dL higher than patient's LDL goal (i.e. LDL goal of < 70 mg/dL, would have non-HDL goal of < 100 mg/dL)  Hemoglobin J8r     Status: Abnormal   Collection Time: 03/31/24 11:17 AM  Result Value Ref Range   Hgb A1c MFr Bld 7.0 (H) 4.6 - 6.5 %    Comment: Glycemic Control Guidelines for People  with Diabetes:Non Diabetic:  <6%Goal of Therapy: <7%Additional Action Suggested:  >8%   Hepatic function panel     Status: None   Collection Time: 03/31/24 11:17 AM  Result Value Ref Range   Total Bilirubin 0.3 0.2 - 1.2 mg/dL   Bilirubin, Direct 0.1 0.0 - 0.3 mg/dL   Alkaline Phosphatase 58 39 - 117 U/L   AST 21 0 - 37 U/L   ALT 18 0 - 35 U/L   Total Protein 6.6 6.0 - 8.3 g/dL   Albumin  4.7 3.5 - 5.2 g/dL  CBC with Differential/Platelet     Status: Abnormal   Collection Time: 03/31/24 11:17 AM  Result Value Ref Range   WBC 6.9 4.0 - 10.5 K/uL   RBC 4.55 3.87 - 5.11 Mil/uL   Hemoglobin 11.8 (L) 12.0 - 15.0 g/dL   HCT 62.4 63.9 - 53.9 %   MCV 82.3 78.0 - 100.0 fl   MCHC 31.5 30.0 - 36.0  g/dL   RDW 86.2 88.4 - 84.4 %   Platelets 270.0 150.0 - 400.0 K/uL   Neutrophils Relative % 59.7 43.0 - 77.0 %   Lymphocytes Relative 30.6 12.0 - 46.0 %   Monocytes Relative 7.1 3.0 - 12.0 %   Eosinophils Relative 2.1 0.0 - 5.0 %   Basophils Relative 0.5 0.0 - 3.0 %   Neutro Abs 4.1 1.4 - 7.7 K/uL   Lymphs Abs 2.1 0.7 - 4.0 K/uL   Monocytes Absolute 0.5 0.1 - 1.0 K/uL   Eosinophils Absolute 0.1 0.0 - 0.7 K/uL   Basophils Absolute 0.0 0.0 - 0.1 K/uL  Basic metabolic panel     Status: Abnormal   Collection Time: 03/31/24 11:17 AM  Result Value Ref Range   Sodium 141 135 - 145 mEq/L   Potassium 4.1 3.5 - 5.1 mEq/L   Chloride 104 96 - 112 mEq/L   CO2 30 19 - 32 mEq/L   Glucose, Bld 129 (H) 70 - 99 mg/dL   BUN 17 6 - 23 mg/dL   Creatinine, Ser 9.29 0.40 - 1.20 mg/dL   GFR 10.77 >39.99 mL/min    Comment: Calculated using the CKD-EPI Creatinine Equation (2021)   Calcium  9.3 8.4 - 10.5 mg/dL  High sensitivity CRP     Status: None   Collection Time: 04/02/24 10:11 AM  Result Value Ref Range   CRP, High Sensitivity 1.230 0.000 - 5.000 mg/L    Comment: Note:  An elevated hs-CRP (>5 mg/L) should be repeated after 2 weeks to rule out recent infection or trauma.  Sedimentation rate     Status: None   Collection Time: 04/02/24 10:11 AM  Result Value Ref Range   Sed Rate 5 0 - 30 mm/hr  Analyzer ANA IFA w/RFLX Titer/Pattern,Systemic Autoimmune Panel 1     Status: Abnormal   Collection Time: 04/02/24 10:11 AM  Result Value Ref Range   Anti Nuclear Antibody (ANA) POSITIVE (A) NEGATIVE    Comment: ANA IFA is a first line screen for detecting the presence of up to approximately 150 autoantibodies in various autoimmune diseases. A positive ANA IFA result is suggestive of autoimmune disease and reflexes to titer and pattern. Further laboratory testing may be considered if clinically indicated. . For additional information, please refer  to http://education.[facility].com/faq/FAQ177 (This link is being provided for informational/educational purposes only.)    DNA Ab (DS) Crithidia, IFA NEGATIVE NEGATIVE   Chromatin (Nucleosomal) Antibody <1.0 NEG <1.0 NEGATIVE AI   ENA SM Ab Ser-aCnc <1.0 NEG <1.0 NEGATIVE  AI   SM/RNP <1.0 NEG <1.0 NEGATIVE AI   Ribonucleic Protein(ENA) Antibody, IgG <1.0 NEG <1.0 NEGATIVE AI   SSA (Ro) (ENA) Antibody, IgG <1.0 NEG <1.0 NEGATIVE AI   SSB (La) (ENA) Antibody, IgG <1.0 NEG <1.0 NEGATIVE AI   Scleroderma (Scl-70) (ENA) Antibody, IgG <1.0 NEG <1.0 NEGATIVE AI   Jo-1 Autoabs <1.0 NEG <1.0 NEGATIVE AI   Centromere Ab Screen <1.0 NEG <1.0 NEGATIVE AI   C3 Complement CANCELED     Comment: TEST NOT PERFORMED. SABRA Specimen received at room temperature.  Result canceled by the ancillary.    C4 Complement CANCELED     Comment: TEST NOT PERFORMED. SABRA Specimen received at room temperature.  Result canceled by the ancillary.    Anticardiolipin IgA <2.0 APL-U/mL    Comment: . SABRA Value        Interpretation -----        -------------- <20.0        Antibody not detected > or = 20.0  Antibody detected .    Anticardiolipin IgG <2.0 GPL-U/mL    Comment: . SABRA Value        Interpretation -----        -------------- <20.0        Antibody not detected > or = 20.0  Antibody detected .    Anticardiolipin IgM <2.0 MPL-U/mL    Comment: . SABRA Value        Interpretation -----        -------------- <20.0        Antibody not detected > or = 20.0  Antibody detected .    Beta-2 Glyco 1 IgA <2.0 U/mL    Comment: . Value        Interpretation -----        -------------- <20.0        Antibody not detected > or = 20.0  Antibody detected .    Beta-2 Glyco I IgG <2.0 U/mL    Comment: . Value        Interpretation -----        -------------- <20.0        Antibody not detected > or = 20.0  Antibody detected .    Beta-2 Glyco 1 IgM <2.0 U/mL    Comment: The antiphospholipid antibody  syndrome (APS) is a clinical-pathologic correlation that includes a clinical event (e.g. arterial or venous thrombosis, pregnancy morbidity) and persistent positive antiphospholipid antibodies (IgM, IgG Cardiolipin or b2GPI antibodies greater than the 99th percentile; or a lupus anticoagulant). International consensus guidelines for APS suggest waiting at least 12 weeks before retesting to confirm antibody persistence. The Systemic Lupus International Collabor[facility] immunological classification criteria for systemic lupus erythematosus (SLE) include testing for isotype IgA, which has yet to be incorporated into APS criteria. Low level antiphospholipid antibodies may sometimes be detected in the setting of infection, drug therapy or aging. . For additional information, please refer to http://education.[facility].com/faq/FAQ109 (This link is being provided for informational/educational purposes only.) . Value        Interpret ation -----        -------------- <20.0        Antibody not detected > or = 20.0  Antibody detected .    Rheumatoid Factor (IgA) CANCELED     Comment: TEST NOT PERFORMED . The specimen exceeds stability for the test requested.  Result canceled by the ancillary.    Rheumatoid Factor (IgG) CANCELED     Comment: TEST NOT PERFORMED . The specimen exceeds  stability for the test requested.  Result canceled by the ancillary.    Rheumatoid Factor (IgM) CANCELED     Comment: TEST NOT PERFORMED . The specimen exceeds stability for the test requested.  Result canceled by the ancillary.    Cyclic Citrullin Peptide Ab <83 Units    Comment:                                       Reference Range:                                       NEGATIVE:                                          <20                                       WEAK POSITIVE:                                          20-39                                       MODERATE POSITIVE:                                           40-59                                       STRONG POSITIVE                                          >59    MUTATED CITRULLINATED VIMENTIN (MCV) AB <20 <20 U/mL    Comment: . Anti-mutated citrullinated vimentin antibody may be used as a second-line marker of rheumatoid arthritis, in addition to rheumatoid factor and anti-cyclic citrullinated peptide (CCP). .    Thyroperoxidase Ab SerPl-aCnc <1 <9 IU/mL  Anti-nuclear ab-titer (ANA titer)     Status: Abnormal   Collection Time: 04/02/24 10:11 AM  Result Value Ref Range   ANA Titer 1 1:160 (H) titer    Comment: . Reference Ranges for Anti-Nuclear Ab Titer: .    <1:40      Negative    1:40-1:80  Low Antibody Level    >1:80      Elevated Antibody Level    ANA Pattern 1 NUCLEAR, DENSE FINE SPECKLED     Comment: Dense fine speckled pattern is seen in normal individuals and rarely associated with systemic lupus erythematosus (SLE), Sjogren's syndrome and systemic sclerosis. . AC-2: Dense Fine Speckled International Consensus on ANA Patterns severties.uy   Analyzer ANA IFA w/RFLX Titer/Pattern,Systemic  Autoimmune Panel 1     Status: Abnormal   Collection Time: 04/21/24 10:54 AM  Result Value Ref Range   Anti Nuclear Antibody (ANA) POSITIVE (A) NEGATIVE    Comment: ANA IFA is a first line screen for detecting the presence of up to approximately 150 autoantibodies in various autoimmune diseases. A positive ANA IFA result is suggestive of autoimmune disease and reflexes to titer and pattern. Further laboratory testing may be considered if clinically indicated. . For additional information, please refer to http://education.[facility].com/faq/FAQ177 (This link is being provided for informational/educational purposes only.)    DNA Ab (DS) Crithidia, IFA NEGATIVE NEGATIVE   Chromatin (Nucleosomal) Antibody <1.0 NEG <1.0 NEGATIVE AI   ENA SM Ab Ser-aCnc <1.0 NEG  <1.0 NEGATIVE AI   SM/RNP <1.0 NEG <1.0 NEGATIVE AI   Ribonucleic Protein(ENA) Antibody, IgG <1.0 NEG <1.0 NEGATIVE AI   SSA (Ro) (ENA) Antibody, IgG <1.0 NEG <1.0 NEGATIVE AI   SSB (La) (ENA) Antibody, IgG <1.0 NEG <1.0 NEGATIVE AI   Scleroderma (Scl-70) (ENA) Antibody, IgG <1.0 NEG <1.0 NEGATIVE AI   Jo-1 Autoabs <1.0 NEG <1.0 NEGATIVE AI   Centromere Ab Screen <1.0 NEG <1.0 NEGATIVE AI   C3 Complement 153 83 - 193 mg/dL   C4 Complement 24 15 - 57 mg/dL   Anticardiolipin IgA <7.9 APL-U/mL    Comment: . SABRA Value        Interpretation -----        -------------- <20.0        Antibody not detected > or = 20.0  Antibody detected .    Anticardiolipin IgG <2.0 GPL-U/mL    Comment: . SABRA Value        Interpretation -----        -------------- <20.0        Antibody not detected > or = 20.0  Antibody detected .    Anticardiolipin IgM <2.0 MPL-U/mL    Comment: . SABRA Value        Interpretation -----        -------------- <20.0        Antibody not detected > or = 20.0  Antibody detected .    Beta-2 Glyco 1 IgA <2.0 U/mL    Comment: . Value        Interpretation -----        -------------- <20.0        Antibody not detected > or = 20.0  Antibody detected .    Beta-2 Glyco I IgG <2.0 U/mL    Comment: . Value        Interpretation -----        -------------- <20.0        Antibody not detected > or = 20.0  Antibody detected .    Beta-2 Glyco 1 IgM <2.0 U/mL    Comment: The antiphospholipid antibody syndrome (APS) is a clinical-pathologic correlation that includes a clinical event (e.g. arterial or venous thrombosis, pregnancy morbidity) and persistent positive antiphospholipid antibodies (IgM, IgG Cardiolipin or b2GPI antibodies greater than the 99th percentile; or a lupus anticoagulant). International consensus guidelines for APS suggest waiting at least 12 weeks before retesting to confirm antibody persistence. The Systemic Lupus International Collabor[facility]  immunological classification criteria for systemic lupus erythematosus (SLE) include testing for isotype IgA, which has yet to be incorporated into APS criteria. Low level antiphospholipid antibodies may sometimes be detected in the setting of infection, drug therapy or aging. . For additional information, please refer to http://education.[facility].com/faq/FAQ109 (This link is  being provided for informational/educational purposes only.) . Value        Interpret ation -----        -------------- <20.0        Antibody not detected > or = 20.0  Antibody detected .    Rheumatoid Factor (IgA) <5 U    Comment:                                       Reference Range:                                       <=6  NEGATIVE                                       >6   POSITIVE    Rheumatoid Factor (IgG) <5 U    Comment:                                       Reference Range:                                       <=6  NEGATIVE                                       >6   POSITIVE    Rheumatoid Factor (IgM) <5 U    Comment:                                       Reference Range:                                       <=6 NEGATIVE                                       >6  POSITIVE    Cyclic Citrullin Peptide Ab <83 Units    Comment:                                       Reference Range:                                       NEGATIVE:                                          <20  WEAK POSITIVE:                                          20-39                                       MODERATE POSITIVE:                                          40-59                                       STRONG POSITIVE                                          >59    MUTATED CITRULLINATED VIMENTIN (MCV) AB <20 <20 U/mL    Comment: . Anti-mutated citrullinated vimentin antibody may be used as a second-line marker of rheumatoid arthritis, in addition to rheumatoid factor and  anti-cyclic citrullinated peptide (CCP). .    Thyroperoxidase Ab SerPl-aCnc <1 <9 IU/mL  Anti-nuclear ab-titer (ANA titer)     Status: Abnormal   Collection Time: 04/21/24 10:54 AM  Result Value Ref Range   ANA Titer 1 1:80 (H) titer    Comment: A low level ANA titer may be present in pre-clinical autoimmune diseases and normal individuals. . Reference Ranges for Anti-Nuclear Ab Titer: .    <1:40      Negative    1:40-1:80  Low Antibody Level    >1:80      Elevated Antibody Level    ANA Pattern 1 NUCLEAR, DENSE FINE SPECKLED     Comment: Dense fine speckled pattern is seen in normal individuals and rarely associated with systemic lupus erythematosus (SLE), Sjogren's syndrome and systemic sclerosis. . AC-2: Dense Fine Speckled International Consensus on ANA Patterns severties.uy   POCT rapid strep A     Status: None   Collection Time: 05/05/24  1:30 PM  Result Value Ref Range   Rapid Strep A Screen Negative Negative  POC Covid19/Flu A&B Antigen     Status: None   Collection Time: 05/05/24  1:30 PM  Result Value Ref Range   Influenza A Antigen, POC Negative Negative   Influenza B Antigen, POC Negative Negative   Covid Antigen, POC Negative Negative      Assessment & Plan Acute upper respiratory infection with cough and sore throat Acute sore throat and dry cough with fever. Negative for flu, COVID, and strep. Likely viral, possibly RSV. Lungs clear, low pneumonia risk. - Ordered blood work for WBC count and differential, BMP - Advised rest, hydration, and chicken noodle soup. - Recommended vitamin C and zinc. - Prescribed Tessalon  Perles for cough. - Provided Trelegy inhaler sample, instructed to rinse mouth after use. - Prescribed Zofran  for nausea. - Instructed to monitor symptoms and contact if no improvement or worsening. - consider CXR if symptoms gets worse and also antibiotics  Asthma No current exacerbation. Chronic shortness of  breath not due to asthma. - Provided Trelegy  inhaler sample for use once daily.  Type 2 diabetes mellitus Type 2 diabetes mellitus.  Chronic shortness of breath Chronic shortness of breath not due to asthma or CHF. No significant findings from pulmonology or cardiac evaluations. Worsens with activity. - Provided Trelegy inhaler sample for use once daily.  History of coronary artery disease with stent placement Coronary artery disease with stent placement. No current chest pain or cardiac distress. - Provided Trelegy inhaler sample for use once daily.

## 2024-05-06 ENCOUNTER — Ambulatory Visit

## 2024-05-06 ENCOUNTER — Ambulatory Visit: Payer: Self-pay | Admitting: Family Medicine

## 2024-05-06 ENCOUNTER — Ambulatory Visit: Admitting: Physician Assistant

## 2024-05-07 ENCOUNTER — Encounter

## 2024-05-07 ENCOUNTER — Encounter: Payer: Self-pay | Admitting: *Deleted

## 2024-05-07 DIAGNOSIS — Z955 Presence of coronary angioplasty implant and graft: Secondary | ICD-10-CM

## 2024-05-07 NOTE — Progress Notes (Signed)
 Cardiac Individual Treatment Plan  Patient Details  Name: CLETUS MEHLHOFF MRN: [id number] Date of Birth: Oct 09, 1955 Referring Provider:   Flowsheet Row Cardiac Rehab from 10/24/2023 in [facility] Cardiac and Pulmonary Rehab  Referring Provider Dr. Evalene Lunger, MD    Initial Encounter Date:  Flowsheet Row Cardiac Rehab from 10/24/2023 in [facility] Cardiac and Pulmonary Rehab  Date 10/24/23    Visit Diagnosis: Status post coronary artery stent placement  Patient's Home Medications on Admission:  Current Outpatient Medications:    albuterol  (VENTOLIN  HFA) 108 (90 Base) MCG/ACT inhaler, Inhale 2 puffs into the lungs every 6 (six) hours as needed for wheezing or shortness of breath., Disp: 8 g, Rfl: 2   amLODipine  (NORVASC ) 2.5 MG tablet, TAKE 1 TABLET BY MOUTH DAILY, Disp: 90 tablet, Rfl: 3   aspirin  EC 81 MG tablet, Take 81 mg by mouth daily., Disp: , Rfl:    benzonatate  (TESSALON ) 100 MG capsule, Take 1 capsule (100 mg total) by mouth 3 (three) times daily as needed for cough., Disp: 40 capsule, Rfl: 0   clobetasol  ointment (TEMOVATE ) 0.05 %, Apply 1 Application topically daily as needed (skin conditions)., Disp: , Rfl:    Estradiol  10 MCG TABS vaginal tablet, Place 1 tablet per vagina at night 2 nights per week., Disp: 30 tablet, Rfl: 12   fenofibrate  160 MG tablet, TAKE 1 TABLET BY MOUTH DAILY, Disp: 90 tablet, Rfl: 3   fluticasone (FLONASE) 50 MCG/ACT nasal spray, Place 2 sprays into both nostrils in the morning and at bedtime., Disp: , Rfl:    Fluticasone-Umeclidin-Vilant (TRELEGY ELLIPTA ) 200-62.5-25 MCG/ACT AEPB, Inhale 1 puff into the lungs daily., Disp: , Rfl:    glucose blood (ACCU-CHEK GUIDE) test strip, Use to check blood sugar daily, Disp: 100 each, Rfl: 3   HYDROcodone -acetaminophen  (NORCO/VICODIN) 5-325 MG tablet, Take by mouth., Disp: , Rfl:    Lactobacillus-Inulin (CULTURELLE ADULT ULT BALANCE PO), Take 1 tablet by mouth daily., Disp: , Rfl:    levothyroxine  (SYNTHROID ) 75 MCG  tablet, TAKE ONE TABLET BY MOUTH EVERY MORNING BEFORE BREAKFAST, Disp: 90 tablet, Rfl: 3   Magnesium  250 MG TABS, Take 250 mg by mouth daily., Disp: , Rfl:    metFORMIN  (GLUCOPHAGE -XR) 500 MG 24 hr tablet, Take 1,000 mg by mouth 2 (two) times daily with a meal., Disp: , Rfl:    Multiple Vitamins-Minerals (ONE-A-DAY WOMENS PETITES PO), Take 1 tablet by mouth daily., Disp: , Rfl:    nitroGLYCERIN  (NITROSTAT ) 0.4 MG SL tablet, Place 1 tablet under tongue at onset of chest pain; you may repeat every 5 minutes for up to 3 doses., Disp: 25 tablet, Rfl: 3   ondansetron  (ZOFRAN ) 4 MG tablet, Take 1 tablet (4 mg total) by mouth every 8 (eight) hours as needed for nausea or vomiting., Disp: 10 tablet, Rfl: 0   pantoprazole  (PROTONIX ) 40 MG tablet, Take 1 tablet (40 mg total) by mouth daily., Disp: 90 tablet, Rfl: 3   prasugrel  (EFFIENT ) 10 MG TABS tablet, Take 1 tablet (10 mg total) by mouth daily., Disp: 30 tablet, Rfl: 11   rosuvastatin  (CRESTOR ) 40 MG tablet, Take 1 tablet (40 mg total) by mouth daily., Disp: 90 tablet, Rfl: 3   sertraline  (ZOLOFT ) 50 MG tablet, TAKE 1 TABLET BY MOUTH DAILY, Disp: 90 tablet, Rfl: 0   traMADol (ULTRAM) 50 MG tablet, Take 50 mg by mouth 2 (two) times daily as needed., Disp: , Rfl:    traZODone  (DESYREL ) 100 MG tablet, TAKE 1 TABLET BY MOUTH AT BEDTIME, Disp:  30 tablet, Rfl: 2  Past Medical History: Past Medical History:  Diagnosis Date   Allergy    Arthritis    Blood transfusion without reported diagnosis 2016   During kidney stone surgery   CAD (coronary artery disease)    a. 01/2010 Cath: mild atherosclerosis in the mid LCX after takeoff of OM1, mild diff LAD dzs;  b. 07/2013 MV: EF 67%, no ischemia.   GAD (generalized anxiety disorder)    Heart murmur    Functional murmur. Ive had it as long as I remember   History of kidney stones    HLD (hyperlipidemia)    Hypothyroidism    Major depressive disorder, recurrent episode, in full remission 09/08/2008   Qualifier:  Diagnosis of  By: Copland MD, Spencer     Migraine headache    Myocarditis (HCC)    a. 01/2010 presented w/ c/p and elev trop-->Cath: mild atherosclerosis in the mid LCX after takeoff of OM1, mild diff LAD dzs;  b. 03/2010 Echo: EF 60-65%, DD, mild LVH;  c. 07/2011 Echo: EF 55-60%, mild LVH, no rwma.   Oxygen deficiency    Sleep apnea    has c-pap.  wears off and on - not lately  using  per reported 07-29-2020   Type 2 diabetes, diet controlled (HCC)     Tobacco Use: Social History   Tobacco Use  Smoking Status Former   Current packs/day: 0.00   Average packs/day: 0.4 packs/day for 35.0 years (12.3 ttl pk-yrs)   Types: Cigarettes   Start date: 09/25/1973   Quit date: 09/25/2008   Years since quitting: 15.6  Smokeless Tobacco Never  Tobacco Comments   quit in 2007-2008, social smoker    Labs: Review Flowsheet  More data exists      Latest Ref Rng & Units 08/23/2022 11/22/2022 08/06/2023 11/30/2023 03/31/2024  Labs for ITP Cardiac and Pulmonary Rehab  Cholestrol 0 - 200 mg/dL - 875  - 80  94   LDL (calc) 0 - 99 mg/dL - - - 21  20   Direct LDL 0 - 99 mg/dL - 37.9  - 19  -  HDL-C >39.00 mg/dL - 59.89  - 36  56.69   Trlycerides 0.0 - 149.0 mg/dL - 749.9  - 869  847.9   Hemoglobin A1c 4.6 - 6.5 % 8.3  7.8  6.4  - 7.0      Exercise Target Goals: Exercise Program Goal: Individual exercise prescription set using results from initial 6 min walk test and THRR while considering  patient's activity barriers and safety.   Exercise Prescription Goal: Initial exercise prescription builds to 30-45 minutes a day of aerobic activity, 2-3 days per week.  Home exercise guidelines will be given to patient during program as part of exercise prescription that the participant will acknowledge.   Education: Aerobic Exercise: - Group verbal and visual presentation on the components of exercise prescription. Introduces F.I.T.T principle from ACSM for exercise prescriptions.  Reviews F.I.T.T. principles of  aerobic exercise including progression. Written material provided at class time.   Education: Resistance Exercise: - Group verbal and visual presentation on the components of exercise prescription. Introduces F.I.T.T principle from ACSM for exercise prescriptions  Reviews F.I.T.T. principles of resistance exercise including progression. Written material provided at class time.    Education: Exercise & Equipment Safety: - Individual verbal instruction and demonstration of equipment use and safety with use of the equipment. Flowsheet Row Cardiac Rehab from 10/24/2023 in [facility] Cardiac and Pulmonary Rehab  Date  10/24/23  Educator NT  Instruction Review Code 1- Verbalizes Understanding    Education: Exercise Physiology & General Exercise Guidelines: - Group verbal and written instruction with models to review the exercise physiology of the cardiovascular system and associated critical values. Provides general exercise guidelines with specific guidelines to those with heart or lung disease. Written material provided at class time.   Education: Flexibility, Balance, Mind/Body Relaxation: - Group verbal and visual presentation with interactive activity on the components of exercise prescription. Introduces F.I.T.T principle from ACSM for exercise prescriptions. Reviews F.I.T.T. principles of flexibility and balance exercise training including progression. Also discusses the mind body connection.  Reviews various relaxation techniques to help reduce and manage stress (i.e. Deep breathing, progressive muscle relaxation, and visualization). Balance handout provided to take home. Written material provided at class time.   Activity Barriers & Risk Stratification:   6 Minute Walk:  6 Minute Walk     Row Name 03/31/24 0940         6 Minute Walk   Phase Discharge     Distance 1400 feet     Distance % Change 13.4 %     Distance Feet Change 165 ft     Walk Time 6 minutes     # of Rest Breaks 0      MPH 2.65     METS 3.2     RPE 12     Perceived Dyspnea  1     VO2 Peak 11.2     Symptoms Yes (comment)     Comments Shoulder and neck aching/pain     Resting HR 80 bpm     Resting BP 104/56     Resting Oxygen Saturation  94 %     Exercise Oxygen Saturation  during 6 min walk 95 %     Max Ex. HR 98 bpm     Max Ex. BP 128/60        Oxygen Initial Assessment:   Oxygen Re-Evaluation:   Oxygen Discharge (Final Oxygen Re-Evaluation):   Initial Exercise Prescription:   Perform Capillary Blood Glucose checks as needed.  Exercise Prescription Changes:   Exercise Prescription Changes     Row Name 11/13/23 1400 11/29/23 0800 12/12/23 0800 12/27/23 1600 01/08/24 1400     Response to Exercise   Blood Pressure (Admit) 118/60 124/62 134/63 128/64 134/70   Blood Pressure (Exercise) 134/68 148/70 132/52 -- --   Blood Pressure (Exit) 118/62 122/64 120/60 110/60 130/80   Heart Rate (Admit) 77 bpm 80 bpm 74 bpm 77 bpm 71 bpm   Heart Rate (Exercise) 110 bpm 103 bpm 105 bpm 117 bpm 114 bpm   Heart Rate (Exit) 64 bpm 78 bpm 73 bpm 68 bpm 82 bpm   Rating of Perceived Exertion (Exercise) 13 13 12 13 12    Perceived Dyspnea (Exercise) 0 -- -- -- --   Symptoms none none none none none   Comments first 2 weeks of exercise -- -- -- --   Duration Progress to 30 minutes of  aerobic without signs/symptoms of physical distress Continue with 30 min of aerobic exercise without signs/symptoms of physical distress. Continue with 30 min of aerobic exercise without signs/symptoms of physical distress. Continue with 30 min of aerobic exercise without signs/symptoms of physical distress. Continue with 30 min of aerobic exercise without signs/symptoms of physical distress.   Intensity THRR unchanged THRR unchanged THRR unchanged THRR unchanged THRR unchanged     Progression   Progression Continue to progress workloads to  maintain intensity without signs/symptoms of physical distress. Continue to progress  workloads to maintain intensity without signs/symptoms of physical distress. Continue to progress workloads to maintain intensity without signs/symptoms of physical distress. Continue to progress workloads to maintain intensity without signs/symptoms of physical distress. Continue to progress workloads to maintain intensity without signs/symptoms of physical distress.   Average METs 2.4 3.34 2.6 2.9 2.9     Resistance Training   Training Prescription Yes Yes Yes Yes Yes   Weight 4 lb 4 lb 4 lb 4 lb 4 lb   Reps 10-15 10-15 10-15 10-15 10-15     Interval Training   Interval Training No No No No No     Treadmill   MPH 2.2 2.2 2 2.2 2.1   Grade 1 1 0 0 0   Minutes 15 15 15 15 15    METs 2.99 2.99 2.53 2.69 2.61     Recumbant Bike   Level -- -- -- -- 3   Watts -- -- -- -- 18   Minutes -- -- -- -- 15   METs -- -- -- -- 3.09     NuStep   Level 2  T6 -- -- -- --   Minutes 15 -- -- -- --   METs 1.7 -- -- -- --     Elliptical   Level -- -- -- 1 --   Minutes -- -- -- 15 --   METs -- -- -- 3.7 --     REL-XR   Level 1 -- 1 2 --   Minutes 15 -- 15 15 --   METs 1.4 -- 2.3 3 --     T5 Nustep   Level -- -- 1 -- --   Minutes -- -- 15 -- --   METs -- -- 1.7 -- --     Rower   Level -- 3 4 -- --   Watts -- 15 10 -- --   Minutes -- 15 15 -- --   METs -- 4.28 4.05 -- --     Oxygen   Maintain Oxygen Saturation 88% or higher 88% or higher 88% or higher 88% or higher 88% or higher    Row Name 01/24/24 1700 03/04/24 1500 03/17/24 0900 03/18/24 1300 04/03/24 1400     Response to Exercise   Blood Pressure (Admit) 118/62 122/60 -- 124/58 124/68   Blood Pressure (Exit) 116/60 116/64 -- 100/60 112/60   Heart Rate (Admit) 89 bpm 89 bpm -- 95 bpm 80 bpm   Heart Rate (Exercise) 116 bpm 109 bpm -- 106 bpm 95 bpm   Heart Rate (Exit) 85 bpm 79 bpm -- 95 bpm 86 bpm   Oxygen Saturation (Admit) -- -- -- -- 94 %   Oxygen Saturation (Exercise) -- -- -- -- 94 %   Oxygen Saturation (Exit) -- -- -- --  97 %   Rating of Perceived Exertion (Exercise) 15 12 -- 14 13   Symptoms none none -- none none   Duration Continue with 30 min of aerobic exercise without signs/symptoms of physical distress. Continue with 30 min of aerobic exercise without signs/symptoms of physical distress. Continue with 30 min of aerobic exercise without signs/symptoms of physical distress. Continue with 30 min of aerobic exercise without signs/symptoms of physical distress. Continue with 30 min of aerobic exercise without signs/symptoms of physical distress.   Intensity THRR unchanged THRR unchanged THRR unchanged THRR unchanged THRR unchanged     Progression   Progression Continue to progress workloads to maintain  intensity without signs/symptoms of physical distress. Continue to progress workloads to maintain intensity without signs/symptoms of physical distress. Continue to progress workloads to maintain intensity without signs/symptoms of physical distress. Continue to progress workloads to maintain intensity without signs/symptoms of physical distress. Continue to progress workloads to maintain intensity without signs/symptoms of physical distress.   Average METs 2.67 3.08 3.08 2.59 2.7     Resistance Training   Training Prescription Yes Yes Yes Yes Yes   Weight 4 lb 4 lb 4 lb 4 lb 4 lb   Reps 10-15 10-15 10-15 10-15 10-15     Interval Training   Interval Training No No No No No     Treadmill   MPH 2.2 2.1 2.1 2.1 2   Grade 0 0.5 0.5 0 0   Minutes 15 15 15 15 15    METs 2.69 2.75 2.75 2.61 2.53     NuStep   Level 2  T6 -- -- 2 --   Minutes 15 -- -- 15 --   METs 1.6 -- -- 1.5 --     Elliptical   Level 1 1 1 1 2    Speed -- -- -- -- 1   Minutes 15 15 15 15 15    METs 3.9 3.8 3.8 3.7 2.6     REL-XR   Level 1 -- -- -- --   Minutes 15 -- -- -- --     Home Exercise Plan   Plans to continue exercise at -- -- [employer] (comment)  [facility] (comment)  [facility] (comment)   [employer]   Frequency -- -- Add 2 additional days to program exercise sessions. Add 2 additional days to program exercise sessions. Add 2 additional days to program exercise sessions.   Initial Home Exercises Provided -- -- 03/17/24 03/17/24 03/17/24     Oxygen   Maintain Oxygen Saturation 88% or higher 88% or higher 88% or higher 88% or higher 88% or higher    Row Name 04/17/24 1000 04/30/24 1000           Response to Exercise   Blood Pressure (Admit) 104/56 122/64      Blood Pressure (Exercise) 128/60 --      Blood Pressure (Exit) 102/60 110/70      Heart Rate (Admit) 80 bpm 89 bpm      Heart Rate (Exercise) 98 bpm 98 bpm      Heart Rate (Exit) 77 bpm 85 bpm      Oxygen Saturation (Admit) 94 % 95 %      Oxygen Saturation (Exercise) 96 % 94 %      Oxygen Saturation (Exit) 95 % 98 %      Rating of Perceived Exertion (Exercise) 12 12      Symptoms none none      Duration Continue with 30 min of aerobic exercise without signs/symptoms of physical distress. Continue with 30 min of aerobic exercise without signs/symptoms of physical distress.      Intensity THRR unchanged THRR unchanged        Progression   Progression Continue to progress workloads to maintain intensity without signs/symptoms of physical distress. Continue to progress workloads to maintain intensity without signs/symptoms of physical distress.      Average METs 2.45 2.79        Resistance Training   Training Prescription Yes Yes      Weight 4 lb 4 lb      Reps 10-15 10-15  Interval Training   Interval Training No No        Treadmill   MPH 1.9 2.1      Grade 0 0      Minutes 15 15      METs 2.45 2.61        Elliptical   Level -- 1      Speed -- 1      Minutes -- 15      METs -- 3.4        Home Exercise Plan   Plans to continue exercise at [employer] (comment)  [employer] (comment)  [employer]      Frequency Add 2 additional days to program exercise sessions. Add 2 additional days to  program exercise sessions.      Initial Home Exercises Provided 03/17/24 03/17/24        Oxygen   Maintain Oxygen Saturation 88% or higher 88% or higher         Exercise Comments:   Exercise Goals and Review:   Exercise Goals Re-Evaluation :  Exercise Goals Re-Evaluation     Row Name 11/13/23 1437 11/29/23 0806 12/12/23 0831 12/27/23 1605 01/08/24 1425     Exercise Goal Re-Evaluation   Exercise Goals Review Increase Physical Activity;Increase Strength and Stamina;Understanding of Exercise Prescription Increase Physical Activity;Increase Strength and Stamina;Understanding of Exercise Prescription Increase Physical Activity;Increase Strength and Stamina;Understanding of Exercise Prescription Increase Physical Activity;Increase Strength and Stamina;Understanding of Exercise Prescription Increase Physical Activity;Increase Strength and Stamina;Understanding of Exercise Prescription   Comments [name] is off to a good start in the program. She was able to attend her first four sessions during this review. During her first sessions she was able to use the treadmill at a speed of 2. and 1% incline, and the T6 nustep at level 2. We will continue to monitor her progress in the program. [name] has only attended two sessions since the last review. She has continued to walk the treadmill at a speed of 2.2 mph with an incline of 1%. She also began using the rower at level 3. We will continue to monitor her progress in the program. [name] is doing well in rehab. She was recently able to increase from level 3 to 4 on the rowing machine. She was also able to maintain level 1 on the XR. We will continue to monitor her progress in the program. [name] is doing well in rehab. Sheincreased her workload on the treadmill to a speed of 2.2 mph with no incline. She also increased to level 2 on the XR. She added the elliptical at level 1 and tolerated it well. We will continue to monitor her progress in the program. [name] has only  attended one session since the last review. She was able to continue to walk the treadmill at a speed of 2.1 mph with no incline. She also began using the recumbent bike and did well at level 3. We will continue to monitor her progress in the program.   Expected Outcomes Short: Continue to follow exercise prescription. Long: Continue exercise to imporve strength and stamina. Short: Attend rehab more regularly. Long: Continue exercise to imporve strength and stamina. Short: increase treadmill workload. Long: Continue exercise to improve strength and stamina. Short: Continue to progressively increase treadmill and XR workloads. Long: Continue exercise to improve strength and stamina. Short: Attend rehab more consistently. Long: Continue exercise to improve strength and stamina.    Row Name 01/24/24 1740 02/07/24 1125 02/21/24 0730 03/04/24 1525  03/12/24 9047     Exercise Goal Re-Evaluation   Exercise Goals Review Increase Physical Activity;Increase Strength and Stamina;Understanding of Exercise Prescription Increase Physical Activity;Increase Strength and Stamina;Understanding of Exercise Prescription Increase Physical Activity;Increase Strength and Stamina;Understanding of Exercise Prescription Increase Physical Activity;Increase Strength and Stamina;Understanding of Exercise Prescription Increase Physical Activity;Increase Strength and Stamina;Understanding of Exercise Prescription   Comments [name] is doing well in rehab. She increased her workload on the treadmill to a speed of 2.2 mph with no incline. She maintained level 1 on the elliptical and XR. She also maintained level 2 on the T6. We will continue to monitor her progress in the program. [name] has not attended rehab since the last review. She has not been to rehab since 08/01. We will contact her to seen when she plans to return to the program. We will continue to monitor her progress. [name] has not attended rehab since the last review. She has not been to  rehab since 08/01 due to being sick. We will continue to monitor her progress when she returns to the program. [name] has returned to the program and is doing well. She recently increased her treadmill workload to a speed of 2.1 mph with an incline of 0.5%. She also continues to work at level 1 on the elliptical. We will continue to monitor her progress in the program. [name] is doing well in rehab, She is also goign to [facility] some on days not at rehab. ENcouraged her to continue to exercise at home as well as work on increasing workloads here at rehab   Expected Outcomes Short: Try level 2 on the XR. Long: Continue exercise to improve strength and stamina. Short: Return to rehab when appropriate. Long: Graduate. Short: Return to rehab when appropriate. Long: Graduate. Short: Continue to progressively increase workloads. Long: Continue exercise to improve strength and stamina. Short: Continue to progressively increase workloads. Long: Continue exercise to improve strength and stamina.    Row Name 03/17/24 9058 03/18/24 1347 04/03/24 1411 04/17/24 1037 04/21/24 0935     Exercise Goal Re-Evaluation   Exercise Goals Review Increase Physical Activity;Able to understand and use rate of perceived exertion (RPE) scale;Knowledge and understanding of Target Heart Rate Range (THRR);Understanding of Exercise Prescription;Increase Strength and Stamina;Able to understand and use Dyspnea scale;Able to check pulse independently Increase Physical Activity;Increase Strength and Stamina;Understanding of Exercise Prescription Increase Physical Activity;Increase Strength and Stamina;Understanding of Exercise Prescription Increase Physical Activity;Increase Strength and Stamina;Understanding of Exercise Prescription Understanding of Exercise Prescription   Comments Reviewed home exercise with pt today.  Pt plans to work out at [employer] for exercise.  Reviewed THR, pulse, RPE, sign and symptoms, pulse oximetery and when to call 911 or MD.   Also discussed weather considerations and indoor options.  Pt voiced understanding. [name] is doing well in the program. She continues to walk the treadmill at 2.1 mph with no incline. She also continues to work at level 2 on the T4 nustep and level 1 on the elliptical. We will continue to monitor her progress in the program. [name] is doing well in rehab. She was recently able to increase from level 1 to 2 on the elliptical, and maintain a speed of on the treadmill. We will continue to monitor her progress in the program. [name] has only attended one session of rehab since the last review. During her one session she completed her post and improved by 13.4%! We will continue to monitor her progress until she graduates from the program.  [name] did her post 26 Min walk test and improved by 13.4%. She plans to use the [facility] for exercise upon graduation.   Expected Outcomes Short: add 1-2 days a week of exercise on off days of rehab. Long: maintain independent exercise routine upon graduation from cardiac rehab. Short: Increase treadmill workload back up to previous level. Long: Continue exercise to improve strength and stamina. Short: Improve on . Long: Continue exercise to improve strength and stamina. Short: Attend rehab more regularly to gain full benefit of exercise. Long: Graduate. Short: graduate Long: maintain independent exercise routine.    Row Name 04/30/24 1050             Exercise Goal Re-Evaluation   Exercise Goals Review Increase Physical Activity;Understanding of Exercise Prescription;Increase Strength and Stamina       Comments [name] has only attended two sessions since the last review. She continues to do well on the treadmill at a speed of 2.1 mph with no incline. She has completed her post and is close to graduating from the program. We will continue to monitor her progress until she graduates from the program.       Expected Outcomes Short: Attend rehab more consistently. Long:  Graduate.          Discharge Exercise Prescription (Final Exercise Prescription Changes):  Exercise Prescription Changes - 04/30/24 1000       Response to Exercise   Blood Pressure (Admit) 122/64    Blood Pressure (Exit) 110/70    Heart Rate (Admit) 89 bpm    Heart Rate (Exercise) 98 bpm    Heart Rate (Exit) 85 bpm    Oxygen Saturation (Admit) 95 %    Oxygen Saturation (Exercise) 94 %    Oxygen Saturation (Exit) 98 %    Rating of Perceived Exertion (Exercise) 12    Symptoms none    Duration Continue with 30 min of aerobic exercise without signs/symptoms of physical distress.    Intensity THRR unchanged      Progression   Progression Continue to progress workloads to maintain intensity without signs/symptoms of physical distress.    Average METs 2.79      Resistance Training   Training Prescription Yes    Weight 4 lb    Reps 10-15      Interval Training   Interval Training No      Treadmill   MPH 2.1    Grade 0    Minutes 15    METs 2.61      Elliptical   Level 1    Speed 1    Minutes 15    METs 3.4      Home Exercise Plan   Plans to continue exercise at [employer] (comment)   [employer]   Frequency Add 2 additional days to program exercise sessions.    Initial Home Exercises Provided 03/17/24      Oxygen   Maintain Oxygen Saturation 88% or higher          Nutrition:  Target Goals: Understanding of nutrition guidelines, daily intake of sodium 1500mg , cholesterol 200mg , calories 30% from fat and 7% or less from saturated fats, daily to have 5 or more servings of fruits and vegetables.  Education: Nutrition 1 -Group instruction provided by verbal, written material, interactive activities, discussions, models, and posters to present general guidelines for heart healthy nutrition including macronutrients, label reading, and promoting whole foods over processed counterparts. Education serves as pensions consultant of discussion of heart healthy eating for all.  Written material provided at class time.    Education: Nutrition 2 -Group instruction provided by verbal, written material, interactive activities, discussions, models, and posters to present general guidelines for heart healthy nutrition including sodium, cholesterol, and saturated fat. Providing guidance of habit forming to improve blood pressure, cholesterol, and body weight. Written material provided at class time.     Biometrics:   Lucia Amen - 03/31/24 0942        Post  Biometrics   Height 5' 4.5 (1.638 m)    Weight 156 lb 12.8 oz (71.1 kg)    Waist Circumference 36 inches    Hip Circumference 39 inches    Waist to Hip Ratio 0.92 %    BMI (Calculated) 26.51    Single Leg Stand 6.9 seconds          Nutrition Therapy Plan and Nutrition Goals:   Nutrition Assessments:  MEDIFICTS Score Key: >=70 Need to make dietary changes  40-70 Heart Healthy Diet <= 40 Therapeutic Level Cholesterol Diet  Flowsheet Row Cardiac Rehab from 04/23/2024 in [facility] Cardiac and Pulmonary Rehab  Picture Your Plate Total Score on Discharge 60   Picture Your Plate Scores: <59 Unhealthy dietary pattern with much room for improvement. 41-50 Dietary pattern unlikely to meet recommendations for good health and room for improvement. 51-60 More healthful dietary pattern, with some room for improvement.  >60 Healthy dietary pattern, although there may be some specific behaviors that could be improved.    Nutrition Goals Re-Evaluation:  Nutrition Goals Re-Evaluation     Row Name 11/26/23 [phone] 03/12/24 0956 03/26/24 0948 04/21/24 0937       Goals   Current Weight 157 lb 3.2 oz (71.3 kg) -- -- --    Comment [name] is complying with RD nutrition goals. She is still trying to lose some weight and says her target weight it 145lb. [name] reports she is follow a heart healthy diet. Cutting back on sodium and saturated fat in her diet. Drinking adequate water [name] reports that she is very consious of  what she eats to help manage her and her husband's health conditions. She is very comfortable reading food labels on a daily basis and watches sodium. She reports incorporating lean protein and fruits and vegetables into her diet. She also is making a consious effort to drink plenty of water. [name] is getting close to graduating program. She states that she feels confident in her nurtion changes that she has made with reading food lables, having lean sources of protein, and incorprotating a variety of fruits and veggies. She plans to continue these healthy changes independently upon graduation from cardiac rehab.    Expected Outcome Short: Continue to diet and follow RD guidelines. Long: Continue to attend rehab and manage diet. Short: Continue to diet and follow RD guidelines. Long: Continue to attend rehab and manage diet. Short: continue to read food labels and watch sodium. Long: maintian heart healthy diet upon graduation from program. Short:graduate Long: maintain heart healthy diet.       Nutrition Goals Discharge (Final Nutrition Goals Re-Evaluation):  Nutrition Goals Re-Evaluation - 04/21/24 0937       Goals   Comment [name] is getting close to graduating program. She states that she feels confident in her nurtion changes that she has made with reading food lables, having lean sources of protein, and incorprotating a variety of fruits and veggies. She plans to continue these healthy changes independently upon graduation from cardiac rehab.    Expected Outcome  Short:graduate Long: maintain heart healthy diet.          Psychosocial: Target Goals: Acknowledge presence or absence of significant depression and/or stress, maximize coping skills, provide positive support system. Participant is able to verbalize types and ability to use techniques and skills needed for reducing stress and depression.   Education: Stress, Anxiety, and Depression - Group verbal and visual presentation to define topics  covered.  Reviews how body is impacted by stress, anxiety, and depression.  Also discusses healthy ways to reduce stress and to treat/manage anxiety and depression. Written material provided at class time.   Education: Sleep Hygiene -Provides group verbal and written instruction about how sleep can affect your health.  Define sleep hygiene, discuss sleep cycles and impact of sleep habits. Review good sleep hygiene tips.   Initial Review & Psychosocial Screening:   Quality of Life Scores:   Quality of Life - 04/23/24 0946       Quality of Life   Select Quality of Life      Quality of Life Scores   Health/Function Post 25.17 %    Socioeconomic Post 27.86 %    Psych/Spiritual Post 28.57 %    Family Post 27.6 %    GLOBAL Post 26.78 %         Scores of 19 and below usually indicate a poorer quality of life in these areas.  A difference of  2-3 points is a clinically meaningful difference.  A difference of 2-3 points in the total score of the Quality of Life Index has been associated with significant improvement in overall quality of life, self-image, physical symptoms, and general health in studies assessing change in quality of life.  PHQ-9: Review Flowsheet  More data exists      05/05/2024 04/23/2024 03/31/2024 11/14/2023 10/24/2023  Depression screen PHQ 2/9  Decreased Interest 0 0 0 0 0  Down, Depressed, Hopeless 0 0 0 0 0  PHQ - 2 Score 0 0 0 0 0  Altered sleeping - 1 0 - 0  Tired, decreased energy - 1 0 - 0  Change in appetite - 1 0 - 0  Feeling bad or failure about yourself  - 0 0 - 0  Trouble concentrating - 0 0 - 0  Moving slowly or fidgety/restless - 0 0 - 0  Suicidal thoughts - 0 0 - 0  PHQ-9 Score - 3  0  - 0   Difficult doing work/chores - Not difficult at all Not difficult at all - -    Details       Data saved with a previous flowsheet row definition        Interpretation of Total Score  Total Score Depression Severity:  1-4 = Minimal depression, 5-9 =  Mild depression, 10-14 = Moderate depression, 15-19 = Moderately severe depression, 20-27 = Severe depression   Psychosocial Evaluation and Intervention:   Psychosocial Re-Evaluation:  Psychosocial Re-Evaluation     Row Name 11/26/23 0940 03/12/24 0955 03/26/24 0955 04/21/24 0943       Psychosocial Re-Evaluation   Current issues with Current Stress Concerns Current Sleep Concerns Current Sleep Concerns Current Sleep Concerns    Comments [name] states that she isnt dealing with any stressors at this time. She does state that she deals insomnia, she was prescribed medication and that seems to be helping. [name] denies any stress, anxiety or depression at this time. She is dealing with insomnia and has been taking medication. Some night it helps some  nights it does not. [name] reports that she continues to take her medicaiton from insomnia and that it pretty much helps manage her sleep patterns. She reports no concerns or changes in stress or mental health. She continues to have a good support system. [name] continues to take medication to manage her sleep. She states that it works well. She is getting close to graduating cardiac rehab and plans to continue to exercise upon graduation.    Expected Outcomes Short: Continue to comply with insomnia medication. Long: Continue to manage stress and insomnia. STG: Continue to comply with insomnia medication. LTG: Continue to manage stress and insomnia. Short: continue to attend cardiac rehab for the mental health benefits of exerise. Long: maintain independent exercise routine upon graduation from cardia  rehab. Short: graduate. Long: maintain good mental health routine.    Interventions Encouraged to attend Cardiac Rehabilitation for the exercise Encouraged to attend Cardiac Rehabilitation for the exercise Encouraged to attend Cardiac Rehabilitation for the exercise Encouraged to attend Cardiac Rehabilitation for the exercise    Continue Psychosocial Services  Follow up  required by staff Follow up required by staff Follow up required by staff Follow up required by staff       Psychosocial Discharge (Final Psychosocial Re-Evaluation):  Psychosocial Re-Evaluation - 04/21/24 0943       Psychosocial Re-Evaluation   Current issues with Current Sleep Concerns    Comments [name] continues to take medication to manage her sleep. She states that it works well. She is getting close to graduating cardiac rehab and plans to continue to exercise upon graduation.    Expected Outcomes Short: graduate. Long: maintain good mental health routine.    Interventions Encouraged to attend Cardiac Rehabilitation for the exercise    Continue Psychosocial Services  Follow up required by staff          Vocational Rehabilitation: Provide vocational rehab assistance to qualifying candidates.   Vocational Rehab Evaluation & Intervention:   Education: Education Goals: Education classes will be provided on a variety of topics geared toward better understanding of heart health and risk factor modification. Participant will state understanding/return demonstration of topics presented as noted by education test scores.  Learning Barriers/Preferences:   General Cardiac Education Topics:  AED/CPR: - Group verbal and written instruction with the use of models to demonstrate the basic use of the AED with the basic ABC's of resuscitation.   Test and Procedures: - Group verbal and visual presentation and models provide information about basic cardiac anatomy and function. Reviews the testing methods done to diagnose heart disease and the outcomes of the test results. Describes the treatment choices: Medical Management, Angioplasty, or Coronary Bypass Surgery for treating various heart conditions including Myocardial Infarction, Angina, Valve Disease, and Cardiac Arrhythmias. Written material provided at class time.   Medication Safety: - Group verbal and visual instruction to review  commonly prescribed medications for heart and lung disease. Reviews the medication, class of the drug, and side effects. Includes the steps to properly store meds and maintain the prescription regimen. Written material provided at class time.   Intimacy: - Group verbal instruction through game format to discuss how heart and lung disease can affect sexual intimacy. Written material provided at class time.   Know Your Numbers and Heart Failure: - Group verbal and visual instruction to discuss disease risk factors for cardiac and pulmonary disease and treatment options.  Reviews associated critical values for Overweight/Obesity, Hypertension, Cholesterol, and Diabetes.  Discusses basics of heart failure: signs/symptoms and  treatments.  Introduces Heart Failure Zone chart for action plan for heart failure. Written material provided at class time.   Infection Prevention: - Provides verbal and written material to individual with discussion of infection control including proper hand washing and proper equipment cleaning during exercise session. Flowsheet Row Cardiac Rehab from 10/24/2023 in [facility] Cardiac and Pulmonary Rehab  Date 10/24/23  Educator NT  Instruction Review Code 1- Verbalizes Understanding    Falls Prevention: - Provides verbal and written material to individual with discussion of falls prevention and safety. Flowsheet Row Cardiac Rehab from 10/24/2023 in [facility] Cardiac and Pulmonary Rehab  Date 10/24/23  Educator NT  Instruction Review Code 1- Verbalizes Understanding    Other: -Provides group and verbal instruction on various topics (see comments)   Knowledge Questionnaire Score:  Knowledge Questionnaire Score - 04/23/24 0945       Knowledge Questionnaire Score   Post Score 26/26          Core Components/Risk Factors/Patient Goals at Admission:   Education:Diabetes - Individual verbal and written instruction to review signs/symptoms of diabetes, desired ranges of  glucose level fasting, after meals and with exercise. Acknowledge that pre and post exercise glucose checks will be done for 3 sessions at entry of program. Flowsheet Row Cardiac Rehab from 10/24/2023 in [facility] Cardiac and Pulmonary Rehab  Date 10/23/23  Educator [facility]  Instruction Review Code 1- Verbalizes Understanding    Core Components/Risk Factors/Patient Goals Review:   Goals and Risk Factor Review     Row Name 11/26/23 0947 03/12/24 0957 03/26/24 0952 04/21/24 0946       Core Components/Risk Factors/Patient Goals Review   Personal Goals Review Weight Management/Obesity;Diabetes Weight Management/Obesity Lipids;Diabetes Lipids;Diabetes    Review [name] is still trying to lose about 10 pounds, she continues to diet and attend rehab in order to achieve her goals. She continues to check her blood sugar, but admits not everyday. Talked with patient about the importance of checking blood sugar everyday. [name] is working on losing weight. She is eating more colorful produce and watching portions. She is also looking to do more exercise outside of rehab. [name] reports that she checks her blood sugar routinely and that it is withing good ranges. She takes all her choleterol meds and diabetes meds and follow up with her doctor regularly to manage these risk factors. [name] reports that she continues to check and manage her blood sugar. She will graduate from the program soon and plans to continue to follow up with her doctors to manage her diabetes and cholesterol.    Expected Outcomes Short: Start checking blood sugar everyday. Long: Continue to exercise and manage diet in order to lose weight and keep blood sugar levels within a healthy range. STG: Follow a heart healthy diet and lose 5-10lbs. LTG: Manage risk factors independently Short: continue to check blood sugars at home and take all meds. Long: manage cardiac risk factors. Short: graduate. Long: control cardiac risk factors.       Core Components/Risk  Factors/Patient Goals at Discharge (Final Review):   Goals and Risk Factor Review - 04/21/24 0946       Core Components/Risk Factors/Patient Goals Review   Personal Goals Review Lipids;Diabetes    Review [name] reports that she continues to check and manage her blood sugar. She will graduate from the program soon and plans to continue to follow up with her doctors to manage her diabetes and cholesterol.    Expected Outcomes Short: graduate. Long: control cardiac risk  factors.          ITP Comments:  ITP Comments     Row Name 11/21/23 0934 12/19/23 0814 01/16/24 0758 02/13/24 0827 03/12/24 1026   ITP Comments 30 Day review completed. Medical Director ITP review done, changes made as directed, and signed approval by Medical Director.    new to program 30 Day review completed. Medical Director ITP review done, changes made as directed, and signed approval by Medical Director. 30 Day review completed. Medical Director ITP review done, changes made as directed, and signed approval by Medical Director. 30 Day review completed. Medical Director ITP review done, changes made as directed, and signed approval by Medical Director. 30 Day review completed. Medical Director ITP review done, changes made as directed, and signed approval by Medical Director.    Row Name 04/09/24 0859 05/07/24 1038         ITP Comments 30 Day review completed. Medical Director ITP review done, changes made as directed, and signed approval by Medical Director. 30 Day review completed. Medical Director ITP review done, changes made as directed, and signed approval by Medical Director.         Comments: 30 day review ITP

## 2024-05-09 ENCOUNTER — Encounter

## 2024-05-12 ENCOUNTER — Encounter

## 2024-05-13 ENCOUNTER — Other Ambulatory Visit: Payer: Self-pay | Admitting: Family Medicine

## 2024-05-13 ENCOUNTER — Telehealth: Payer: Self-pay

## 2024-05-13 NOTE — Telephone Encounter (Signed)
 No answer, LVMM to request callback and number provided.

## 2024-05-14 ENCOUNTER — Encounter

## 2024-05-14 DIAGNOSIS — Z955 Presence of coronary angioplasty implant and graft: Secondary | ICD-10-CM

## 2024-05-14 NOTE — Progress Notes (Signed)
 Daily Session Note  Patient Details  Name: Brandi Mahoney MRN: [id number] Date of Birth: 02-04-1956 Referring Provider:   Flowsheet Row Cardiac Rehab from 10/24/2023 in [facility] Cardiac and Pulmonary Rehab  Referring Provider Dr. Evalene Lunger, MD    Encounter Date: 05/14/2024  Check In:  Session Check In - 05/14/24 0920       Check-In   Supervising physician immediately available to respond to emergencies See telemetry face sheet for immediately available ER MD    Location [facility]-Cardiac & Pulmonary Rehab    Staff Present Burnard Davenport RN,BSN,MPA;Maxon Burnell BS, Exercise Physiologist;Laura Cates RN,BSN;Margaret Best, MS, Exercise Physiologist    Virtual Visit No    Medication changes reported     No    Fall or balance concerns reported    No    Warm-up and Cool-down Performed on first and last piece of equipment    Resistance Training Performed Yes    VAD Patient? No    PAD/SET Patient? No      Pain Assessment   Currently in Pain? No/denies             Social History   Tobacco Use  Smoking Status Former   Current packs/day: 0.00   Average packs/day: 0.4 packs/day for 35.0 years (12.3 ttl pk-yrs)   Types: Cigarettes   Start date: 09/25/1973   Quit date: 09/25/2008   Years since quitting: 15.6  Smokeless Tobacco Never  Tobacco Comments   quit in 2007-2008, social smoker    Goals Met:  Proper associated with RPD/PD & O2 Sat Independence with exercise equipment Exercise tolerated well No report of concerns or symptoms today Strength training completed today  Goals Unmet:  Not Applicable  Comments: Pt able to follow exercise prescription today without complaint.  Will continue to monitor for progression.    Dr. Oneil Pinal is Medical Director for [facility] Cardiac Rehabilitation.  Dr. Fuad Aleskerov is Medical Director for [facility] Pulmonary Rehabilitation.

## 2024-05-19 ENCOUNTER — Encounter: Attending: Cardiovascular Disease

## 2024-05-19 DIAGNOSIS — Z955 Presence of coronary angioplasty implant and graft: Secondary | ICD-10-CM | POA: Insufficient documentation

## 2024-05-19 NOTE — Progress Notes (Signed)
 Daily Session Note  Patient Details  Name: ZOUA CAPORASO MRN: [id number] Date of Birth: 1956-05-23 Referring Provider:   Flowsheet Row Cardiac Rehab from 10/24/2023 in [facility] Cardiac and Pulmonary Rehab  Referring Provider Dr. Evalene Lunger, MD    Encounter Date: 05/19/2024  Check In:  Session Check In - 05/19/24 0916       Check-In   Supervising physician immediately available to respond to emergencies See telemetry face sheet for immediately available ER MD    Location [facility]-Cardiac & Pulmonary Rehab    Staff Present Burnard Davenport RN,BSN,MPA;Maxon Burnell BS, Exercise Physiologist;Joseph [facility] Dyane BS, ACSM CEP, Exercise Physiologist    Virtual Visit No    Medication changes reported     No    Fall or balance concerns reported    No    Warm-up and Cool-down Performed on first and last piece of equipment    Resistance Training Performed Yes    VAD Patient? No    PAD/SET Patient? No      Pain Assessment   Currently in Pain? No/denies             Social History   Tobacco Use  Smoking Status Former   Current packs/day: 0.00   Average packs/day: 0.4 packs/day for 35.0 years (12.3 ttl pk-yrs)   Types: Cigarettes   Start date: 09/25/1973   Quit date: 09/25/2008   Years since quitting: 15.6  Smokeless Tobacco Never  Tobacco Comments   quit in 2007-2008, social smoker    Goals Met:  Proper associated with RPD/PD & O2 Sat Independence with exercise equipment Exercise tolerated well No report of concerns or symptoms today Strength training completed today  Goals Unmet:  Not Applicable  Comments: Pt able to follow exercise prescription today without complaint.  Will continue to monitor for progression.    Dr. Oneil Pinal is Medical Director for [facility] Cardiac Rehabilitation.  Dr. Fuad Aleskerov is Medical Director for [facility] Pulmonary Rehabilitation.

## 2024-05-20 ENCOUNTER — Encounter: Payer: Self-pay | Admitting: Pharmacist

## 2024-05-20 NOTE — Progress Notes (Signed)
 Pharmacy Quality Measure Review  This patient is appearing on a report for being at risk of failing the adherence measure for cholesterol (statin) medications this calendar year.   Medication: rosuvastatin  40 mg Last fill date: 02/11/24 for 90 day supply  Insurance report was not up to date. No action needed at this time.  Medication has been refilled as of 05/04/24 x90 ds. Next refill 2026.

## 2024-05-21 ENCOUNTER — Other Ambulatory Visit: Payer: Self-pay | Admitting: Family Medicine

## 2024-05-21 ENCOUNTER — Encounter: Admitting: Emergency Medicine

## 2024-05-21 DIAGNOSIS — Z955 Presence of coronary angioplasty implant and graft: Secondary | ICD-10-CM

## 2024-05-21 NOTE — Progress Notes (Signed)
 Daily Session Note  Patient Details  Name: Brandi Mahoney MRN: [id number] Date of Birth: [birth year] Referring Provider:   Flowsheet Row Cardiac Rehab from 10/24/2023 in [facility] Cardiac and Pulmonary Rehab  Referring Provider Dr. Evalene Lunger, MD    Encounter Date: 05/21/2024  Check In:  Session Check In - 05/21/24 0941       Check-In   Supervising physician immediately available to respond to emergencies See telemetry face sheet for immediately available ER MD    Location [facility]-Cardiac & Pulmonary Rehab    Staff Present Leita Franks RN,BSN;Denise Rhew PhD, RN,CNS,CEN;Joseph [facility] BS, Exercise Physiologist    Virtual Visit No    Medication changes reported     No    Fall or balance concerns reported    No    Warm-up and Cool-down Performed on first and last piece of equipment    Resistance Training Performed Yes    VAD Patient? No    PAD/SET Patient? No      Pain Assessment   Currently in Pain? No/denies             Social History   Tobacco Use  Smoking Status Former   Current packs/day: 0.00   Average packs/day: 0.4 packs/day for 35.0 years (12.3 ttl pk-yrs)   Types: Cigarettes   Start date: 09/25/1973   Quit date: 09/25/2008   Years since quitting: 15.6  Smokeless Tobacco Never  Tobacco Comments   quit in 2007-2008, social smoker    Goals Met:  Independence with exercise equipment Exercise tolerated well No report of concerns or symptoms today Strength training completed today  Goals Unmet:  Not Applicable  Comments: Pt able to follow exercise prescription today without complaint.  Will continue to monitor for progression.    Dr. Oneil Pinal is Medical Director for [facility] Cardiac Rehabilitation.  Dr. Fuad Aleskerov is Medical Director for [facility] Pulmonary Rehabilitation.

## 2024-05-23 ENCOUNTER — Encounter

## 2024-05-23 DIAGNOSIS — Z955 Presence of coronary angioplasty implant and graft: Secondary | ICD-10-CM

## 2024-05-23 NOTE — Progress Notes (Signed)
 Discharge Summary Patient: Brandi Mahoney DOB: Apr 07, 1956  Rock graduated today from  rehab with 36 sessions completed.  Details of the patient's exercise prescription and what She needs to do in order to continue the prescription and progress were discussed with patient.  Patient was given a copy of prescription and goals.  Patient verbalized understanding. [name] plans to continue to exercise by going to the [facility].   6 Minute Walk     Row Name 03/31/24 0940         6 Minute Walk   Phase Discharge     Distance 1400 feet     Distance % Change 13.4 %     Distance Feet Change 165 ft     Walk Time 6 minutes     # of Rest Breaks 0     MPH 2.65     METS 3.2     RPE 12     Perceived Dyspnea  1     VO2 Peak 11.2     Symptoms Yes (comment)     Comments Shoulder and neck aching/pain     Resting HR 80 bpm     Resting BP 104/56     Resting Oxygen Saturation  94 %     Exercise Oxygen Saturation  during 6 min walk 95 %     Max Ex. HR 98 bpm     Max Ex. BP 128/60

## 2024-05-23 NOTE — Progress Notes (Signed)
 Daily Session Note  Patient Details  Name: Brandi Mahoney MRN: [id number] Date of Birth: 06/30/1955 Referring Provider:   Flowsheet Row Cardiac Rehab from 10/24/2023 in [facility]  Referring Provider Dr. Evalene Lunger, MD    Encounter Date: 05/23/2024  Check In:  Session Check In - 05/23/24 0925       Check-In   Supervising physician immediately available to respond to emergencies See telemetry face sheet for immediately available ER MD    Location [facility] Pul[facility]    Staff Present Burnard Davenport RN,BSN,MPA;Maxon Conetta BS, Exercise Physiologist;Joseph Rolinda RCP,RRT,BSRT;Laura Cates RN,BSN    Virtual Visit No    Medication changes reported     No    Fall or balance concerns reported    No    Warm-up and Cool-down Performed on first and last piece of equipment    Resistance Training Performed Yes    VAD Patient? No    PAD/SET Patient? No      Pain Assessment   Currently in Pain? No/denies             Social History   Tobacco Use  Smoking Status Former   Current packs/day: 0.00   Average packs/day: 0.4 packs/day for 35.0 years (12.3 ttl pk-yrs)   Types: Cigarettes   Start date: 09/25/1973   Quit date: 09/25/2008   Years since quitting: 15.6  Smokeless Tobacco Never  Tobacco Comments   quit in 2007-2008, social smoker    Goals Met:  Proper associated with RPD/PD & O2 Sat Independence with exercise equipment Exercise tolerated well No report of concerns or symptoms today Strength training completed today  Goals Unmet:  Not Applicable  Comments:  Brandi Mahoney graduated today from  rehab with 36 sessions completed.  Details of the patient's exercise prescription and what She needs to do in order to continue the prescription and progress were discussed with patient.  Patient was given a copy of prescription and goals.  Patient verbalized understanding. Brandi Mahoney plans to continue to exercise by going to the [facility].    Dr. Oneil Pinal is  Medical Director for [facility] Cardiac Rehabilitation.  Dr. Fuad Aleskerov is Medical Director for [facility] Pul[facility].

## 2024-05-23 NOTE — Progress Notes (Signed)
 Cardiac Individual Treatment Plan  Patient Details  Name: Brandi Mahoney MRN: [id number] Date of Birth: [birth year] Referring Provider:   Flowsheet Row Cardiac Rehab from 10/24/2023 in [facility] Cardiac and Pulmonary Rehab  Referring Provider Dr. Evalene Lunger, MD    Initial Encounter Date:  Flowsheet Row Cardiac Rehab from 10/24/2023 in [facility] Cardiac and Pulmonary Rehab  Date 10/24/23    Visit Diagnosis: Status post coronary artery stent placement  Patient's Home Medications on Admission:  Current Outpatient Medications:    albuterol  (VENTOLIN  HFA) 108 (90 Base) MCG/ACT inhaler, Inhale 2 puffs into the lungs every 6 (six) hours as needed for wheezing or shortness of breath., Disp: 8 g, Rfl: 2   amLODipine  (NORVASC ) 2.5 MG tablet, TAKE 1 TABLET BY MOUTH DAILY, Disp: 90 tablet, Rfl: 3   aspirin  EC 81 MG tablet, Take 81 mg by mouth daily., Disp: , Rfl:    benzonatate  (TESSALON ) 100 MG capsule, Take 1 capsule (100 mg total) by mouth 3 (three) times daily as needed for cough., Disp: 40 capsule, Rfl: 0   clobetasol  ointment (TEMOVATE ) 0.05 %, Apply 1 Application topically daily as needed (skin conditions)., Disp: , Rfl:    Estradiol  10 MCG TABS vaginal tablet, Place 1 tablet per vagina at night 2 nights per week., Disp: 30 tablet, Rfl: 12   fenofibrate  160 MG tablet, TAKE 1 TABLET BY MOUTH DAILY, Disp: 90 tablet, Rfl: 3   fluticasone (FLONASE) 50 MCG/ACT nasal spray, Place 2 sprays into both nostrils in the morning and at bedtime., Disp: , Rfl:    Fluticasone-Umeclidin-Vilant (TRELEGY ELLIPTA ) 200-62.5-25 MCG/ACT AEPB, Inhale 1 puff into the lungs daily., Disp: , Rfl:    glucose blood (ACCU-CHEK GUIDE) test strip, Use to check blood sugar daily, Disp: 100 each, Rfl: 3   HYDROcodone -acetaminophen  (NORCO/VICODIN) 5-325 MG tablet, Take by mouth., Disp: , Rfl:    Lactobacillus-Inulin (CULTURELLE ADULT ULT BALANCE PO), Take 1 tablet by mouth daily., Disp: , Rfl:    levothyroxine  (SYNTHROID ) 75 MCG  tablet, TAKE ONE TABLET BY MOUTH EVERY MORNING BEFORE BREAKFAST, Disp: 90 tablet, Rfl: 3   Magnesium  250 MG TABS, Take 250 mg by mouth daily., Disp: , Rfl:    metFORMIN  (GLUCOPHAGE -XR) 500 MG 24 hr tablet, Take 2 tablets (1,000 mg total) by mouth daily with breakfast., Disp: 180 tablet, Rfl: 3   Multiple Vitamins-Minerals (ONE-A-DAY WOMENS PETITES PO), Take 1 tablet by mouth daily., Disp: , Rfl:    nitroGLYCERIN  (NITROSTAT ) 0.4 MG SL tablet, Place 1 tablet under tongue at onset of chest pain; you may repeat every 5 minutes for up to 3 doses., Disp: 25 tablet, Rfl: 3   ondansetron  (ZOFRAN ) 4 MG tablet, Take 1 tablet (4 mg total) by mouth every 8 (eight) hours as needed for nausea or vomiting., Disp: 10 tablet, Rfl: 0   pantoprazole  (PROTONIX ) 40 MG tablet, Take 1 tablet (40 mg total) by mouth daily., Disp: 90 tablet, Rfl: 3   prasugrel  (EFFIENT ) 10 MG TABS tablet, Take 1 tablet (10 mg total) by mouth daily., Disp: 30 tablet, Rfl: 11   rosuvastatin  (CRESTOR ) 40 MG tablet, Take 1 tablet (40 mg total) by mouth daily., Disp: 90 tablet, Rfl: 3   sertraline  (ZOLOFT ) 50 MG tablet, TAKE 1 TABLET BY MOUTH DAILY, Disp: 90 tablet, Rfl: 1   traMADol (ULTRAM) 50 MG tablet, Take 50 mg by mouth 2 (two) times daily as needed., Disp: , Rfl:    traZODone  (DESYREL ) 100 MG tablet, TAKE 1 TABLET BY MOUTH AT BEDTIME, Disp:  30 tablet, Rfl: 2  Past Medical History: Past Medical History:  Diagnosis Date   Allergy    Arthritis    Blood transfusion without reported diagnosis 2016   During kidney stone surgery   CAD (coronary artery disease)    a. 01/2010 Cath: mild atherosclerosis in the mid LCX after takeoff of OM1, mild diff LAD dzs;  b. 07/2013 MV: EF 67%, no ischemia.   GAD (generalized anxiety disorder)    Heart murmur    Functional murmur. Ive had it as long as I remember   History of kidney stones    HLD (hyperlipidemia)    Hypothyroidism    Major depressive disorder, recurrent episode, in full remission  09/08/2008   Qualifier: Diagnosis of  By: Copland MD, Spencer     Migraine headache    Myocarditis (HCC)    a. 01/2010 presented w/ c/p and elev trop-->Cath: mild atherosclerosis in the mid LCX after takeoff of OM1, mild diff LAD dzs;  b. 03/2010 Echo: EF 60-65%, DD, mild LVH;  c. 07/2011 Echo: EF 55-60%, mild LVH, no rwma.   Oxygen deficiency    Sleep apnea    has c-pap.  wears off and on - not lately  using  per reported 07-29-2020   Type 2 diabetes, diet controlled (HCC)     Tobacco Use: Social History   Tobacco Use  Smoking Status Former   Current packs/day: 0.00   Average packs/day: 0.4 packs/day for 35.0 years (12.3 ttl pk-yrs)   Types: Cigarettes   Start date: 09/25/1973   Quit date: 09/25/2008   Years since quitting: 15.6  Smokeless Tobacco Never  Tobacco Comments   quit in 2007-2008, social smoker    Labs: Review Flowsheet  More data exists      Latest Ref Rng & Units 08/23/2022 11/22/2022 08/06/2023 11/30/2023 03/31/2024  Labs for ITP Cardiac and Pulmonary Rehab  Cholestrol 0 - 200 mg/dL - 875  - 80  94   LDL (calc) 0 - 99 mg/dL - - - 21  20   Direct LDL 0 - 99 mg/dL - 37.9  - 19  -  HDL-C >39.00 mg/dL - 59.89  - 36  56.69   Trlycerides 0.0 - 149.0 mg/dL - 749.9  - 869  847.9   Hemoglobin A1c 4.6 - 6.5 % 8.3  7.8  6.4  - 7.0      Exercise Target Goals: Exercise Program Goal: Individual exercise prescription set using results from initial 6 min walk test and THRR while considering  patient's activity barriers and safety.   Exercise Prescription Goal: Initial exercise prescription builds to 30-45 minutes a day of aerobic activity, 2-3 days per week.  Home exercise guidelines will be given to patient during program as part of exercise prescription that the participant will acknowledge.   Education: Aerobic Exercise: - Group verbal and visual presentation on the components of exercise prescription. Introduces F.I.T.T principle from ACSM for exercise prescriptions.  Reviews  F.I.T.T. principles of aerobic exercise including progression. Written material provided at class time.   Education: Resistance Exercise: - Group verbal and visual presentation on the components of exercise prescription. Introduces F.I.T.T principle from ACSM for exercise prescriptions  Reviews F.I.T.T. principles of resistance exercise including progression. Written material provided at class time.    Education: Exercise & Equipment Safety: - Individual verbal instruction and demonstration of equipment use and safety with use of the equipment. Flowsheet Row Cardiac Rehab from 10/24/2023 in [facility] Cardiac and Pulmonary Rehab  Date  10/24/23  Educator NT  Instruction Review Code 1- Verbalizes Understanding    Education: Exercise Physiology & General Exercise Guidelines: - Group verbal and written instruction with models to review the exercise physiology of the cardiovascular system and associated critical values. Provides general exercise guidelines with specific guidelines to those with heart or lung disease. Written material provided at class time.   Education: Flexibility, Balance, Mind/Body Relaxation: - Group verbal and visual presentation with interactive activity on the components of exercise prescription. Introduces F.I.T.T principle from ACSM for exercise prescriptions. Reviews F.I.T.T. principles of flexibility and balance exercise training including progression. Also discusses the mind body connection.  Reviews various relaxation techniques to help reduce and manage stress (i.e. Deep breathing, progressive muscle relaxation, and visualization). Balance handout provided to take home. Written material provided at class time.   Activity Barriers & Risk Stratification:   6 Minute Walk:  6 Minute Walk     Row Name 03/31/24 0940         6 Minute Walk   Phase Discharge     Distance 1400 feet     Distance % Change 13.4 %     Distance Feet Change 165 ft     Walk Time 6 minutes      # of Rest Breaks 0     MPH 2.65     METS 3.2     RPE 12     Perceived Dyspnea  1     VO2 Peak 11.2     Symptoms Yes (comment)     Comments Shoulder and neck aching/pain     Resting HR 80 bpm     Resting BP 104/56     Resting Oxygen Saturation  94 %     Exercise Oxygen Saturation  during 6 min walk 95 %     Max Ex. HR 98 bpm     Max Ex. BP 128/60        Oxygen Initial Assessment:   Oxygen Re-Evaluation:   Oxygen Discharge (Final Oxygen Re-Evaluation):   Initial Exercise Prescription:   Perform Capillary Blood Glucose checks as needed.  Exercise Prescription Changes:   Exercise Prescription Changes     Row Name 11/29/23 0800 12/12/23 0800 12/27/23 1600 01/08/24 1400 01/24/24 1700     Response to Exercise   Blood Pressure (Admit) 124/62 134/63 128/64 134/70 118/62   Blood Pressure (Exercise) 148/70 132/52 -- -- --   Blood Pressure (Exit) 122/64 120/60 110/60 130/80 116/60   Heart Rate (Admit) 80 bpm 74 bpm 77 bpm 71 bpm 89 bpm   Heart Rate (Exercise) 103 bpm 105 bpm 117 bpm 114 bpm 116 bpm   Heart Rate (Exit) 78 bpm 73 bpm 68 bpm 82 bpm 85 bpm   Rating of Perceived Exertion (Exercise) 13 12 13 12 15    Symptoms none none none none none   Duration Continue with 30 min of aerobic exercise without signs/symptoms of physical distress. Continue with 30 min of aerobic exercise without signs/symptoms of physical distress. Continue with 30 min of aerobic exercise without signs/symptoms of physical distress. Continue with 30 min of aerobic exercise without signs/symptoms of physical distress. Continue with 30 min of aerobic exercise without signs/symptoms of physical distress.   Intensity THRR unchanged THRR unchanged THRR unchanged THRR unchanged THRR unchanged     Progression   Progression Continue to progress workloads to maintain intensity without signs/symptoms of physical distress. Continue to progress workloads to maintain intensity without signs/symptoms of physical  distress. Continue to  progress workloads to maintain intensity without signs/symptoms of physical distress. Continue to progress workloads to maintain intensity without signs/symptoms of physical distress. Continue to progress workloads to maintain intensity without signs/symptoms of physical distress.   Average METs 3.34 2.6 2.9 2.9 2.67     Resistance Training   Training Prescription Yes Yes Yes Yes Yes   Weight 4 lb 4 lb 4 lb 4 lb 4 lb   Reps 10-15 10-15 10-15 10-15 10-15     Interval Training   Interval Training No No No No No     Treadmill   MPH 2.2 2 2.2 2.1 2.2   Grade 1 0 0 0 0   Minutes 15 15 15 15 15    METs 2.99 2.53 2.69 2.61 2.69     Recumbant Bike   Level -- -- -- 3 --   Watts -- -- -- 18 --   Minutes -- -- -- 15 --   METs -- -- -- 3.09 --     NuStep   Level -- -- -- -- 2  T6   Minutes -- -- -- -- 15   METs -- -- -- -- 1.6     Elliptical   Level -- -- 1 -- 1   Minutes -- -- 15 -- 15   METs -- -- 3.7 -- 3.9     REL-XR   Level -- 1 2 -- 1   Minutes -- 15 15 -- 15   METs -- 2.3 3 -- --     T5 Nustep   Level -- 1 -- -- --   Minutes -- 15 -- -- --   METs -- 1.7 -- -- --     Rower   Level 3 4 -- -- --   Watts 15 10 -- -- --   Minutes 15 15 -- -- --   METs 4.28 4.05 -- -- --     Oxygen   Maintain Oxygen Saturation 88% or higher 88% or higher 88% or higher 88% or higher 88% or higher    Row Name 03/04/24 1500 03/17/24 0900 03/18/24 1300 04/03/24 1400 04/17/24 1000     Response to Exercise   Blood Pressure (Admit) 122/60 -- 124/58 124/68 104/56   Blood Pressure (Exercise) -- -- -- -- 128/60   Blood Pressure (Exit) 116/64 -- 100/60 112/60 102/60   Heart Rate (Admit) 89 bpm -- 95 bpm 80 bpm 80 bpm   Heart Rate (Exercise) 109 bpm -- 106 bpm 95 bpm 98 bpm   Heart Rate (Exit) 79 bpm -- 95 bpm 86 bpm 77 bpm   Oxygen Saturation (Admit) -- -- -- 94 % 94 %   Oxygen Saturation (Exercise) -- -- -- 94 % 96 %   Oxygen Saturation (Exit) -- -- -- 97 % 95 %    Rating of Perceived Exertion (Exercise) 12 -- 14 13 12    Symptoms none -- none none none   Duration Continue with 30 min of aerobic exercise without signs/symptoms of physical distress. Continue with 30 min of aerobic exercise without signs/symptoms of physical distress. Continue with 30 min of aerobic exercise without signs/symptoms of physical distress. Continue with 30 min of aerobic exercise without signs/symptoms of physical distress. Continue with 30 min of aerobic exercise without signs/symptoms of physical distress.   Intensity THRR unchanged THRR unchanged THRR unchanged THRR unchanged THRR unchanged     Progression   Progression Continue to progress workloads to maintain intensity without signs/symptoms of physical distress. Continue to progress  workloads to maintain intensity without signs/symptoms of physical distress. Continue to progress workloads to maintain intensity without signs/symptoms of physical distress. Continue to progress workloads to maintain intensity without signs/symptoms of physical distress. Continue to progress workloads to maintain intensity without signs/symptoms of physical distress.   Average METs 3.08 3.08 2.59 2.7 2.45     Resistance Training   Training Prescription Yes Yes Yes Yes Yes   Weight 4 lb 4 lb 4 lb 4 lb 4 lb   Reps 10-15 10-15 10-15 10-15 10-15     Interval Training   Interval Training No No No No No     Treadmill   MPH 2.1 2.1 2.1 2 1.9   Grade 0.5 0.5 0 0 0   Minutes 15 15 15 15 15    METs 2.75 2.75 2.61 2.53 2.45     NuStep   Level -- -- 2 -- --   Minutes -- -- 15 -- --   METs -- -- 1.5 -- --     Elliptical   Level 1 1 1 2  --   Speed -- -- -- 1 --   Minutes 15 15 15 15  --   METs 3.8 3.8 3.7 2.6 --     Home Exercise Plan   Plans to continue exercise at -- [employer] (comment)  [employer] (comment)  [employer] (comment)  [employer] (comment)  [employer]   Frequency -- Add 2 additional days  to program exercise sessions. Add 2 additional days to program exercise sessions. Add 2 additional days to program exercise sessions. Add 2 additional days to program exercise sessions.   Initial Home Exercises Provided -- 03/17/24 03/17/24 03/17/24 03/17/24     Oxygen   Maintain Oxygen Saturation 88% or higher 88% or higher 88% or higher 88% or higher 88% or higher    Row Name 04/30/24 1000 05/14/24 1500           Response to Exercise   Blood Pressure (Admit) 122/64 118/60      Blood Pressure (Exit) 110/70 108/62      Heart Rate (Admit) 89 bpm 79 bpm      Heart Rate (Exercise) 98 bpm 96 bpm      Heart Rate (Exit) 85 bpm 74 bpm      Oxygen Saturation (Admit) 95 % 97 %      Oxygen Saturation (Exercise) 94 % 93 %      Oxygen Saturation (Exit) 98 % 95 %      Rating of Perceived Exertion (Exercise) 12 11      Symptoms none none      Duration Continue with 30 min of aerobic exercise without signs/symptoms of physical distress. Continue with 30 min of aerobic exercise without signs/symptoms of physical distress.      Intensity THRR unchanged THRR unchanged        Progression   Progression Continue to progress workloads to maintain intensity without signs/symptoms of physical distress. Continue to progress workloads to maintain intensity without signs/symptoms of physical distress.      Average METs 2.79 2.49        Resistance Training   Training Prescription Yes Yes      Weight 4 lb 4 lb      Reps 10-15 10-15        Interval Training   Interval Training No No        Treadmill   MPH 2.1 2      Grade 0 0  Minutes 15 15      METs 2.61 2.53        Elliptical   Level 1 --      Speed 1 --      Minutes 15 --      METs 3.4 --        Home Exercise Plan   Plans to continue exercise at [employer] (comment)  [employer] (comment)  [employer]      Frequency Add 2 additional days to program exercise sessions. Add 2 additional days to program exercise sessions.       Initial Home Exercises Provided 03/17/24 03/17/24        Oxygen   Maintain Oxygen Saturation 88% or higher 88% or higher         Exercise Comments:   Exercise Comments     Row Name 05/23/24 9073           Exercise Comments Amillia graduated today from  rehab with 36 sessions completed.  Details of the patient's exercise prescription and what Brandi Mahoney needs to do in order to continue the prescription and progress were discussed with patient.  Patient was given a copy of prescription and goals.  Patient verbalized understanding. Emanie plans to continue to exercise by going to the [facility].          Exercise Goals and Review:   Exercise Goals Re-Evaluation :  Exercise Goals Re-Evaluation     Row Name 11/29/23 0806 12/12/23 0831 12/27/23 1605 01/08/24 1425 01/24/24 1740     Exercise Goal Re-Evaluation   Exercise Goals Review Increase Physical Activity;Increase Strength and Stamina;Understanding of Exercise Prescription Increase Physical Activity;Increase Strength and Stamina;Understanding of Exercise Prescription Increase Physical Activity;Increase Strength and Stamina;Understanding of Exercise Prescription Increase Physical Activity;Increase Strength and Stamina;Understanding of Exercise Prescription Increase Physical Activity;Increase Strength and Stamina;Understanding of Exercise Prescription   Comments Brandi Mahoney has only attended two sessions since the last review. Brandi Mahoney has continued to walk the treadmill at a speed of 2.2 mph with an incline of 1%. Brandi Mahoney also began using the rower at level 3. We will continue to monitor Brandi Mahoney progress in the program. Brandi Mahoney is doing well in rehab. Brandi Mahoney was recently able to increase from level 3 to 4 on the rowing machine. Brandi Mahoney was also able to maintain level 1 on the XR. We will continue to monitor Brandi Mahoney progress in the program. Brandi Mahoney is doing well in rehab. Sheincreased Brandi Mahoney workload on the treadmill to a speed of 2.2 mph with no incline. Brandi Mahoney also increased to level 2 on the XR. Brandi Mahoney  added the elliptical at level 1 and tolerated it well. We will continue to monitor Brandi Mahoney progress in the program. Brandi Mahoney has only attended one session since the last review. Brandi Mahoney was able to continue to walk the treadmill at a speed of 2.1 mph with no incline. Brandi Mahoney also began using the recumbent bike and did well at level 3. We will continue to monitor Brandi Mahoney progress in the program. Brandi Mahoney is doing well in rehab. Brandi Mahoney increased Brandi Mahoney workload on the treadmill to a speed of 2.2 mph with no incline. Brandi Mahoney maintained level 1 on the elliptical and XR. Brandi Mahoney also maintained level 2 on the T6. We will continue to monitor Brandi Mahoney progress in the program.   Expected Outcomes Short: Attend rehab more regularly. Long: Continue exercise to imporve strength and stamina. Short: increase treadmill workload. Long: Continue exercise to improve strength and stamina. Short: Continue to progressively increase treadmill and XR workloads.  Long: Continue exercise to improve strength and stamina. Short: Attend rehab more consistently. Long: Continue exercise to improve strength and stamina. Short: Try level 2 on the XR. Long: Continue exercise to improve strength and stamina.    Row Name 02/07/24 1125 02/21/24 0730 03/04/24 1525 03/12/24 0952 03/17/24 0941     Exercise Goal Re-Evaluation   Exercise Goals Review Increase Physical Activity;Increase Strength and Stamina;Understanding of Exercise Prescription Increase Physical Activity;Increase Strength and Stamina;Understanding of Exercise Prescription Increase Physical Activity;Increase Strength and Stamina;Understanding of Exercise Prescription Increase Physical Activity;Increase Strength and Stamina;Understanding of Exercise Prescription Increase Physical Activity;Able to understand and use rate of perceived exertion (RPE) scale;Knowledge and understanding of Target Heart Rate Range (THRR);Understanding of Exercise Prescription;Increase Strength and Stamina;Able to understand and use Dyspnea scale;Able to  check pulse independently   Comments Brandi Mahoney has not attended rehab since the last review. Brandi Mahoney has not been to rehab since 08/01. We will contact Brandi Mahoney to seen when Brandi Mahoney plans to return to the program. We will continue to monitor Brandi Mahoney progress. Brandi Mahoney has not attended rehab since the last review. Brandi Mahoney has not been to rehab since 08/01 due to being sick. We will continue to monitor Brandi Mahoney progress when Brandi Mahoney returns to the program. Brandi Mahoney has returned to the program and is doing well. Brandi Mahoney recently increased Brandi Mahoney treadmill workload to a speed of 2.1 mph with an incline of 0.5%. Brandi Mahoney also continues to work at level 1 on the elliptical. We will continue to monitor Brandi Mahoney progress in the program. Brandi Mahoney is doing well in rehab, Brandi Mahoney is also goign to [facility] some on days not at rehab. ENcouraged Brandi Mahoney to continue to exercise at home as well as work on increasing workloads here at rehab Reviewed home exercise with pt today.  Pt plans to work out at [employer] for exercise.  Reviewed THR, pulse, RPE, sign and symptoms, pulse oximetery and when to call 911 or MD.  Also discussed weather considerations and indoor options.  Pt voiced understanding.   Expected Outcomes Short: Return to rehab when appropriate. Long: Graduate. Short: Return to rehab when appropriate. Long: Graduate. Short: Continue to progressively increase workloads. Long: Continue exercise to improve strength and stamina. Short: Continue to progressively increase workloads. Long: Continue exercise to improve strength and stamina. Short: add 1-2 days a week of exercise on off days of rehab. Long: maintain independent exercise routine upon graduation from cardiac rehab.    Row Name 03/18/24 1347 04/03/24 1411 04/17/24 1037 04/21/24 0935 04/30/24 1050     Exercise Goal Re-Evaluation   Exercise Goals Review Increase Physical Activity;Increase Strength and Stamina;Understanding of Exercise Prescription Increase Physical Activity;Increase Strength and Stamina;Understanding of Exercise  Prescription Increase Physical Activity;Increase Strength and Stamina;Understanding of Exercise Prescription Understanding of Exercise Prescription Increase Physical Activity;Understanding of Exercise Prescription;Increase Strength and Stamina   Comments Brandi Mahoney is doing well in the program. Brandi Mahoney continues to walk the treadmill at 2.1 mph with no incline. Brandi Mahoney also continues to work at level 2 on the T4 nustep and level 1 on the elliptical. We will continue to monitor Brandi Mahoney progress in the program. Brandi Mahoney is doing well in rehab. Brandi Mahoney was recently able to increase from level 1 to 2 on the elliptical, and maintain a speed of on the treadmill. We will continue to monitor Brandi Mahoney progress in the program. Brandi Mahoney has only attended one session of rehab since the last review. During Brandi Mahoney one session Brandi Mahoney completed Brandi Mahoney post and improved by 13.4%! We will continue to monitor  Brandi Mahoney progress until Brandi Mahoney graduates from the program. Brandi Mahoney did Brandi Mahoney post 6 Min walk test and improved by 13.4%. Brandi Mahoney plans to use the [facility] for exercise upon graduation. Brandi Mahoney has only attended two sessions since the last review. Brandi Mahoney continues to do well on the treadmill at a speed of 2.1 mph with no incline. Brandi Mahoney has completed Brandi Mahoney post and is close to graduating from the program. We will continue to monitor Brandi Mahoney progress until Brandi Mahoney graduates from the program.   Expected Outcomes Short: Increase treadmill workload back up to previous level. Long: Continue exercise to improve strength and stamina. Short: Improve on . Long: Continue exercise to improve strength and stamina. Short: Attend rehab more regularly to gain full benefit of exercise. Long: Graduate. Short: graduate Long: maintain independent exercise routine. Short: Attend rehab more consistently. Long: Graduate.    Row Name 05/14/24 1543             Exercise Goal Re-Evaluation   Exercise Goals Review Increase Physical Activity;Understanding of Exercise Prescription;Increase Strength and Stamina        Comments Brandi Mahoney has only attended two sessions since the last review. Brandi Mahoney continues to walk on the treadmill at a speed of 2 mph with no incline for 30 minutes. Brandi Mahoney is close to graduating from the program. We will continue to monitor Brandi Mahoney progress until Brandi Mahoney graduates from the program.       Expected Outcomes Short: Attend rehab more consistently. Long: Graduate.          Discharge Exercise Prescription (Final Exercise Prescription Changes):  Exercise Prescription Changes - 05/14/24 1500       Response to Exercise   Blood Pressure (Admit) 118/60    Blood Pressure (Exit) 108/62    Heart Rate (Admit) 79 bpm    Heart Rate (Exercise) 96 bpm    Heart Rate (Exit) 74 bpm    Oxygen Saturation (Admit) 97 %    Oxygen Saturation (Exercise) 93 %    Oxygen Saturation (Exit) 95 %    Rating of Perceived Exertion (Exercise) 11    Symptoms none    Duration Continue with 30 min of aerobic exercise without signs/symptoms of physical distress.    Intensity THRR unchanged      Progression   Progression Continue to progress workloads to maintain intensity without signs/symptoms of physical distress.    Average METs 2.49      Resistance Training   Training Prescription Yes    Weight 4 lb    Reps 10-15      Interval Training   Interval Training No      Treadmill   MPH 2    Grade 0    Minutes 15    METs 2.53      Home Exercise Plan   Plans to continue exercise at [employer] (comment)   [facility]   Frequency Add 2 additional days to program exercise sessions.    Initial Home Exercises Provided 03/17/24      Oxygen   Maintain Oxygen Saturation 88% or higher          Nutrition:  Target Goals: Understanding of nutrition guidelines, daily intake of sodium 1500mg , cholesterol 200mg , calories 30% from fat and 7% or less from saturated fats, daily to have 5 or more servings of fruits and vegetables.  Education: Nutrition 1 -Group instruction provided by verbal, written material, interactive  activities, discussions, models, and posters to present general guidelines for heart healthy nutrition including macronutrients, label reading, and  promoting whole foods over processed counterparts. Education serves as pensions consultant of discussion of heart healthy eating for all. Written material provided at class time.    Education: Nutrition 2 -Group instruction provided by verbal, written material, interactive activities, discussions, models, and posters to present general guidelines for heart healthy nutrition including sodium, cholesterol, and saturated fat. Providing guidance of habit forming to improve blood pressure, cholesterol, and body weight. Written material provided at class time.     Biometrics:   Brandi Mahoney - 03/31/24 0942        Post  Biometrics   Height 5' 4.5 (1.638 m)    Weight 156 lb 12.8 oz (71.1 kg)    Waist Circumference 36 inches    Hip Circumference 39 inches    Waist to Hip Ratio 0.92 %    BMI (Calculated) 26.51    Single Leg Stand 6.9 seconds          Nutrition Therapy Plan and Nutrition Goals:   Nutrition Assessments:  MEDIFICTS Score Key: >=70 Need to make dietary changes  40-70 Heart Healthy Diet <= 40 Therapeutic Level Cholesterol Diet  Flowsheet Row Cardiac Rehab from 04/23/2024 in [facility]  Picture Your Plate Total Score on Discharge 60   Picture Your Plate Scores: <59 Unhealthy dietary pattern with much room for improvement. 41-50 Dietary pattern unlikely to meet recommendations for good health and room for improvement. 51-60 More healthful dietary pattern, with some room for improvement.  >60 Healthy dietary pattern, although there may be some specific behaviors that could be improved.    Nutrition Goals Re-Evaluation:  Nutrition Goals Re-Evaluation     Row Name 11/26/23 [phone] 03/12/24 0956 03/26/24 0948 04/21/24 0937       Goals   Current Weight 157 lb 3.2 oz (71.3 kg) -- -- --    Comment Brandi Mahoney is  complying with RD nutrition goals. Brandi Mahoney is still trying to lose some weight and says Brandi Mahoney target weight it 145lb. Brandi Mahoney reports Brandi Mahoney is follow a heart healthy diet. Cutting back on sodium and saturated fat in Brandi Mahoney diet. Drinking adequate water Brandi Mahoney reports that Brandi Mahoney is very consious of what Brandi Mahoney eats to help manage Brandi Mahoney and Brandi Mahoney husband's health conditions. Brandi Mahoney is very comfortable reading food labels on a daily basis and watches sodium. Brandi Mahoney reports incorporating lean protein and fruits and vegetables into Brandi Mahoney diet. Brandi Mahoney also is making a consious effort to drink plenty of water. Brandi Mahoney is getting close to graduating program. Brandi Mahoney states that Brandi Mahoney feels confident in Brandi Mahoney nurtion changes that Brandi Mahoney has made with reading food lables, having lean sources of protein, and incorprotating a variety of fruits and veggies. Brandi Mahoney plans to continue these healthy changes independently upon graduation from cardiac rehab.    Expected Outcome Short: Continue to diet and follow RD guidelines. Long: Continue to attend rehab and manage diet. Short: Continue to diet and follow RD guidelines. Long: Continue to attend rehab and manage diet. Short: continue to read food labels and watch sodium. Long: maintian heart healthy diet upon graduation from program. Short:graduate Long: maintain heart healthy diet.       Nutrition Goals Discharge (Final Nutrition Goals Re-Evaluation):  Nutrition Goals Re-Evaluation - 04/21/24 0937       Goals   Comment Brandi Mahoney is getting close to graduating program. Brandi Mahoney states that Brandi Mahoney feels confident in Brandi Mahoney nurtion changes that Brandi Mahoney has made with reading food lables, having lean sources of protein, and incorprotating a variety of fruits and veggies.  Brandi Mahoney plans to continue these healthy changes independently upon graduation from cardiac rehab.    Expected Outcome Short:graduate Long: maintain heart healthy diet.          Psychosocial: Target Goals: Acknowledge presence or absence of significant depression and/or stress,  maximize coping skills, provide positive support system. Participant is able to verbalize types and ability to use techniques and skills needed for reducing stress and depression.   Education: Stress, Anxiety, and Depression - Group verbal and visual presentation to define topics covered.  Reviews how body is impacted by stress, anxiety, and depression.  Also discusses healthy ways to reduce stress and to treat/manage anxiety and depression. Written material provided at class time.   Education: Sleep Hygiene -Provides group verbal and written instruction about how sleep can affect your health.  Define sleep hygiene, discuss sleep cycles and impact of sleep habits. Review good sleep hygiene tips.   Initial Review & Psychosocial Screening:   Quality of Life Scores:   Quality of Life - 04/23/24 0946       Quality of Life   Select Quality of Life      Quality of Life Scores   Health/Function Post 25.17 %    Socioeconomic Post 27.86 %    Psych/Spiritual Post 28.57 %    Family Post 27.6 %    GLOBAL Post 26.78 %         Scores of 19 and below usually indicate a poorer quality of life in these areas.  A difference of  2-3 points is a clinically meaningful difference.  A difference of 2-3 points in the total score of the Quality of Life Index has been associated with significant improvement in overall quality of life, self-image, physical symptoms, and general health in studies assessing change in quality of life.  PHQ-9: Review Flowsheet  More data exists      05/05/2024 04/23/2024 03/31/2024 11/14/2023 10/24/2023  Depression screen PHQ 2/9  Decreased Interest 0 0 0 0 0  Down, Depressed, Hopeless 0 0 0 0 0  PHQ - 2 Score 0 0 0 0 0  Altered sleeping - 1 0 - 0  Tired, decreased energy - 1 0 - 0  Change in appetite - 1 0 - 0  Feeling bad or failure about yourself  - 0 0 - 0  Trouble concentrating - 0 0 - 0  Moving slowly or fidgety/restless - 0 0 - 0  Suicidal thoughts - 0 0 - 0   PHQ-9 Score - 3  0  - 0   Difficult doing work/chores - Not difficult at all Not difficult at all - -    Details       Data saved with a previous flowsheet row definition        Interpretation of Total Score  Total Score Depression Severity:  1-4 = Minimal depression, 5-9 = Mild depression, 10-14 = Moderate depression, 15-19 = Moderately severe depression, 20-27 = Severe depression   Psychosocial Evaluation and Intervention:   Psychosocial Re-Evaluation:  Psychosocial Re-Evaluation     Row Name 11/26/23 0940 03/12/24 0955 03/26/24 0955 04/21/24 0943       Psychosocial Re-Evaluation   Current issues with Current Stress Concerns Current Sleep Concerns Current Sleep Concerns Current Sleep Concerns    Comments Brandi Mahoney states that Brandi Mahoney isnt dealing with any stressors at this time. Brandi Mahoney does state that Brandi Mahoney deals insomnia, Brandi Mahoney was prescribed medication and that seems to be helping. Brandi Mahoney denies any stress, anxiety or depression  at this time. Brandi Mahoney is dealing with insomnia and has been taking medication. Some night it helps some nights it does not. Brandi Mahoney reports that Brandi Mahoney continues to take Brandi Mahoney medicaiton from insomnia and that it pretty much helps manage Brandi Mahoney sleep patterns. Brandi Mahoney reports no concerns or changes in stress or mental health. Brandi Mahoney continues to have a good support system. Brandi Mahoney continues to take medication to manage Brandi Mahoney sleep. Brandi Mahoney states that it works well. Brandi Mahoney is getting close to graduating cardiac rehab and plans to continue to exercise upon graduation.    Expected Outcomes Short: Continue to comply with insomnia medication. Long: Continue to manage stress and insomnia. STG: Continue to comply with insomnia medication. LTG: Continue to manage stress and insomnia. Short: continue to attend cardiac rehab for the mental health benefits of exerise. Long: maintain independent exercise routine upon graduation from cardia  rehab. Short: graduate. Long: maintain good mental health routine.    Interventions  Encouraged to attend Cardiac Rehabilitation for the exercise Encouraged to attend Cardiac Rehabilitation for the exercise Encouraged to attend Cardiac Rehabilitation for the exercise Encouraged to attend Cardiac Rehabilitation for the exercise    Continue Psychosocial Services  Follow up required by staff Follow up required by staff Follow up required by staff Follow up required by staff       Psychosocial Discharge (Final Psychosocial Re-Evaluation):  Psychosocial Re-Evaluation - 04/21/24 0943       Psychosocial Re-Evaluation   Current issues with Current Sleep Concerns    Comments Brandi Mahoney continues to take medication to manage Brandi Mahoney sleep. Brandi Mahoney states that it works well. Brandi Mahoney is getting close to graduating cardiac rehab and plans to continue to exercise upon graduation.    Expected Outcomes Short: graduate. Long: maintain good mental health routine.    Interventions Encouraged to attend Cardiac Rehabilitation for the exercise    Continue Psychosocial Services  Follow up required by staff          Vocational Rehabilitation: Provide vocational rehab assistance to qualifying candidates.   Vocational Rehab Evaluation & Intervention:   Education: Education Goals: Education classes will be provided on a variety of topics geared toward better understanding of heart health and risk factor modification. Participant will state understanding/return demonstration of topics presented as noted by education test scores.  Learning Barriers/Preferences:   General Cardiac Education Topics:  AED/CPR: - Group verbal and written instruction with the use of models to demonstrate the basic use of the AED with the basic ABC's of resuscitation.   Test and Procedures: - Group verbal and visual presentation and models provide information about basic cardiac anatomy and function. Reviews the testing methods done to diagnose heart disease and the outcomes of the test results. Describes the treatment choices:  Medical Management, Angioplasty, or Coronary Bypass Surgery for treating various heart conditions including Myocardial Infarction, Angina, Valve Disease, and Cardiac Arrhythmias. Written material provided at class time.   Medication Safety: - Group verbal and visual instruction to review commonly prescribed medications for heart and lung disease. Reviews the medication, class of the drug, and side effects. Includes the steps to properly store meds and maintain the prescription regimen. Written material provided at class time.   Intimacy: - Group verbal instruction through game format to discuss how heart and lung disease can affect sexual intimacy. Written material provided at class time.   Know Your Numbers and Heart Failure: - Group verbal and visual instruction to discuss disease risk factors for cardiac and pulmonary disease and treatment options.  Reviews associated critical values for Overweight/Obesity, Hypertension, Cholesterol, and Diabetes.  Discusses basics of heart failure: signs/symptoms and treatments.  Introduces Heart Failure Zone chart for action plan for heart failure. Written material provided at class time.   Infection Prevention: - Provides verbal and written material to individual with discussion of infection control including proper hand washing and proper equipment cleaning during exercise session. Flowsheet Row Cardiac Rehab from 10/24/2023 in [facility] Cardiac and Pul[facility] Rehab  Date 10/24/23  Educator NT  Instruction Review Code 1- Verbalizes Understanding    Falls Prevention: - Provides verbal and written material to individual with discussion of falls prevention and safety. Flowsheet Row Cardiac Rehab from 10/24/2023 in [facility] Cardiac and Pulmonary Rehab  Date 10/24/23  Educator NT  Instruction Review Code 1- Verbalizes Understanding    Other: -Provides group and verbal instruction on various topics (see comments)   Knowledge Questionnaire Score:  Knowledge  Questionnaire Score - 04/23/24 0945       Knowledge Questionnaire Score   Post Score 26/26          Core Components/Risk Factors/Patient Goals at Admission:   Education:Diabetes - Individual verbal and written instruction to review signs/symptoms of diabetes, desired ranges of glucose level fasting, after meals and with exercise. Acknowledge that pre and post exercise glucose checks will be done for 3 sessions at entry of program. Flowsheet Row Cardiac Rehab from 10/24/2023 in [facility] Cardiac and Pulmonary Rehab  Date 10/23/23  Educator [facility]  Instruction Review Code 1- Verbalizes Understanding    Core Components/Risk Factors/Patient Goals Review:   Goals and Risk Factor Review     Row Name 11/26/23 0947 03/12/24 0957 03/26/24 0952 04/21/24 0946       Core Components/Risk Factors/Patient Goals Review   Personal Goals Review Weight Management/Obesity;Diabetes Weight Management/Obesity Lipids;Diabetes Lipids;Diabetes    Review Brandi Mahoney is still trying to lose about 10 pounds, Brandi Mahoney continues to diet and attend rehab in order to achieve Brandi Mahoney goals. Brandi Mahoney continues to check Brandi Mahoney blood sugar, but admits not everyday. Talked with patient about the importance of checking blood sugar everyday. Brandi Mahoney is working on losing weight. Brandi Mahoney is eating more colorful produce and watching portions. Brandi Mahoney is also looking to do more exercise outside of rehab. Brandi Mahoney reports that Brandi Mahoney checks Brandi Mahoney blood sugar routinely and that it is withing good ranges. Brandi Mahoney takes all Brandi Mahoney choleterol meds and diabetes meds and follow up with Brandi Mahoney doctor regularly to manage these risk factors. Brandi Mahoney reports that Brandi Mahoney continues to check and manage Brandi Mahoney blood sugar. Brandi Mahoney will graduate from the program soon and plans to continue to follow up with Brandi Mahoney doctors to manage Brandi Mahoney diabetes and cholesterol.    Expected Outcomes Short: Start checking blood sugar everyday. Long: Continue to exercise and manage diet in order to lose weight and keep blood sugar levels within a  healthy range. STG: Follow a heart healthy diet and lose 5-10lbs. LTG: Manage risk factors independently Short: continue to check blood sugars at home and take all meds. Long: manage cardiac risk factors. Short: graduate. Long: control cardiac risk factors.       Core Components/Risk Factors/Patient Goals at Discharge (Final Review):   Goals and Risk Factor Review - 04/21/24 0946       Core Components/Risk Factors/Patient Goals Review   Personal Goals Review Lipids;Diabetes    Review Brandi Mahoney reports that Brandi Mahoney continues to check and manage Brandi Mahoney blood sugar. Brandi Mahoney will graduate from the program soon and plans to continue to follow up with Brandi Mahoney  doctors to manage Brandi Mahoney diabetes and cholesterol.    Expected Outcomes Short: graduate. Long: control cardiac risk factors.          ITP Comments:  ITP Comments     Row Name 12/19/23 0814 01/16/24 0758 02/13/24 0827 03/12/24 1026 04/09/24 0859   ITP Comments 30 Day review completed. Medical Director ITP review done, changes made as directed, and signed approval by Medical Director. 30 Day review completed. Medical Director ITP review done, changes made as directed, and signed approval by Medical Director. 30 Day review completed. Medical Director ITP review done, changes made as directed, and signed approval by Medical Director. 30 Day review completed. Medical Director ITP review done, changes made as directed, and signed approval by Medical Director. 30 Day review completed. Medical Director ITP review done, changes made as directed, and signed approval by Medical Director.    Row Name 05/07/24 1038 05/23/24 0926         ITP Comments 30 Day review completed. Medical Director ITP review done, changes made as directed, and signed approval by Medical Director. Serenitee graduated today from  rehab with 36 sessions completed.  Details of the patient's exercise prescription and what Brandi Mahoney needs to do in order to continue the prescription and progress were discussed with  patient.  Patient was given a copy of prescription and goals.  Patient verbalized understanding. Karyssa plans to continue to exercise by going to the [facility].         Comments: discharge ITP

## 2024-05-26 ENCOUNTER — Encounter

## 2024-05-28 ENCOUNTER — Encounter

## 2024-05-30 ENCOUNTER — Encounter

## 2024-05-30 NOTE — Progress Notes (Unsigned)
 Cardiology Office Note    Date:  05/30/2024   ID:  Brandi Mahoney, DOB February 22, 1956, MRN [id number]  PCP:  Watt Mirza, MD  Cardiologist:  Evalene Lunger, MD  Electrophysiologist:  None   Chief Complaint: Follow-up  History of Present Illness:   Brandi Mahoney is a 68 y.o. 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  She was admitted in 2011 with chest pain and elevated troponin.  Cardiac cath showed mild CAD.  Presentation was felt to be secondary to viral myocarditis.  Echo in 09/2017 showed an EF of 65 to 70% with no significant valvular disease.  Nuclear stress test in 10/2019 was without ischemia.  Previously followed by Dr. Gollan, last seeing him in 2022.   She was seen in the ED in 08/2023 with chest pain.  Troponin negative.  EKG showed sinus rhythm with a new left bundle branch block.  CTA chest was negative for PE or aortic dissection.  She followed up with Dr. Verlin in 09/2023 noting progressive exertional chest pressure and dyspnea, concerning for unstable angina.  In this setting she underwent LHC on 09/25/2023 that showed 85% stenosis in the proximal LAD which was treated successfully with PCI/DES.  In the setting of arteriotomy site discomfort she underwent ultrasound of the right upper extremity which showed a pseudoaneurysm measuring 3 x 1 cm that was not amenable to thrombin injection.  She underwent ultrasound-guided compression without resolution or thrombosis.  She was evaluated by vascular surgery and underwent ulnar pseudoaneurysm repair without complications.  Echo in 10/2023 showed an EF of 55 to 60%, no regional wall motion abnormalities, grade 1 diastolic dysfunction, normal RV systolic function, ventricular cavity size, and RVSP, moderate mitral valve regurgitation, and an estimated  right atrial pressure of 8 mmHg.  She followed up with vascular surgery with repeat right upper extremity arterial duplex showing no obstruction visualized in the right upper extremity with widely patent right ulnar artery with no further intervention indicated.  She was seen in our office on 11/29/2023 and reported a longstanding history of dyspnea and dry cough with high-resolution CT without evidence of ILD, with findings consistent of small airway disease.   She was seen in the ED on 12/11/2023 with intermittent twinges of chest pain and a stronger sensation when taking a deep breath.  EKG showed sinus rhythm with known LBBB.  High-sensitivity troponin negative x 2.  D-dimer negative.  She followed up in our office in 12/2023 and reported 3 episodes of randomly occurring sharp chest discomfort that would last for 4 to 5 seconds with spontaneous resolution leading up to her ER visit the month prior and had been without further episodes.  She underwent a trial of amlodipine  2.5 mg daily.  ***   Labs independently reviewed: 03/2024 - potassium 4.1, BUN 17, serum creatinine 0.7, Hgb 11.8, PLT 270, albumin  4.7, AST/ALT normal, A1c 7.0, TC 94, TG 152, HDL 43, LDL 20, TSH normal  Past Medical History:  Diagnosis Date   Allergy    Arthritis    Blood transfusion without reported diagnosis 2016   During kidney stone surgery   CAD (coronary artery disease)    a. 01/2010 Cath: mild atherosclerosis in the mid LCX after takeoff of OM1, mild diff LAD dzs;  b. 07/2013 MV: EF 67%, no ischemia.   GAD (  generalized anxiety disorder)    Heart murmur    Functional murmur. Ive had it as long as I remember   History of kidney stones    HLD (hyperlipidemia)    Hypothyroidism    Major depressive disorder, recurrent episode, in full remission 09/08/2008   Qualifier: Diagnosis of  By: Copland MD, Spencer     Migraine headache    Myocarditis (HCC)    a. 01/2010 presented w/ c/p and elev trop-->Cath: mild  atherosclerosis in the mid LCX after takeoff of OM1, mild diff LAD dzs;  b. 03/2010 Echo: EF 60-65%, DD, mild LVH;  c. 07/2011 Echo: EF 55-60%, mild LVH, no rwma.   Oxygen deficiency    Sleep apnea    has c-pap.  wears off and on - not lately  using  per reported 07-29-2020   Type 2 diabetes, diet controlled [facility])     Past Surgical History:  Procedure Laterality Date   ABDOMINAL HYSTERECTOMY  1998   APPENDECTOMY  1974   BREAST SURGERY Right 1988   fatty tumore from breast   CARDIAC CATHETERIZATION  01/2010   [facility],   CHOLECYSTECTOMY  2002   COLONOSCOPY  2017   CORONARY STENT INTERVENTION N/A 09/25/2023   Procedure: CORONARY STENT INTERVENTION;  Surgeon: Jordan, Peter M, MD;  Location: [facility] INVASIVE CV LAB;  Service: Cardiovascular;  Laterality: N/A;   CYST EXCISION     ovarian cyst 1994   INSERTION OF ARTERIOVENOUS (AV) ARTEGRAFT ARM Right 09/27/2023   Procedure: 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;  Surgeon: Pearline Norman RAMAN, MD;  [facility] OR;  Service: Vascular;  Laterality: Right;   LEFT HEART CATH AND CORONARY ANGIOGRAPHY N/A 09/25/2023   Procedure: LEFT HEART CATH AND CORONARY ANGIOGRAPHY;  Surgeon: Jordan, Peter M, MD;  Location: [facility] INVASIVE CV LAB;  Service: Cardiovascular;  Laterality: N/A;   NEPHROLITHOTOMY Right 08/20/2014   Procedure: RIGHT PERCUTANEOUS NEPHROLITHOTOMY ;  Surgeon: Norleen JINNY Seltzer, MD;  Location: [facility] ORS;  Service: Urology;  Laterality: Right;   POLYPECTOMY     RECTAL EXAM UNDER ANESTHESIA N/A 08/02/2020   Procedure: DONNY EXAM UNDER ANESTHESIA INJECTION OF PERIANAL BOTOX ;  Surgeon: Teresa Lonni HERO, MD;  Location: [facility];  Service: General;  Laterality: N/A;   TOTAL ABDOMINAL HYSTERECTOMY W/ BILATERAL SALPINGOOPHORECTOMY  2000   no ovaries, uterus, or cervix.    Current Medications: No outpatient medications have been marked as taking for the 06/06/24 encounter (Appointment) with Abigail Bernardino HERO, PA-C.    Allergies:   Cat dander, Pseudoeph-doxylamine-dm-apap, Gluten, and Penicillins   Social History   Socioeconomic History   Marital status: Married    Spouse name: Koren   Number of children: 3   Years of education: Not on file   Highest education level: Some college, no degree  Occupational History   Occupation: retired    Comment: [employer]  Tobacco Use   Smoking status: Former    Current packs/day: 0.00    Average packs/day: 0.4 packs/day for 35.0 years (12.3 ttl pk-yrs)    Types: Cigarettes    Start date: 09/25/1973    Quit date: 09/25/2008    Years since quitting: 15.6   Smokeless tobacco: Never   Tobacco comments:    quit in 2007-2008, social smoker  Vaping Use   Vaping status: Never Used  Substance and Sexual Activity   Alcohol use: Not Currently   Drug use: No   Sexual activity: Not Currently  Other Topics Concern   Not on file  Social History Narrative   Lives in [place] w/ husband.  No reg exercise.   Social Drivers of Health   Tobacco Use: Medium Risk (05/05/2024)   Patient History    Smoking Tobacco Use: Former    Smokeless Tobacco Use: Never    Passive Exposure: Not on file  Financial Resource Strain: Low Risk (04/02/2024)   Overall Financial Resource Strain (CARDIA)    Difficulty of Paying Living Expenses: Not very hard  Food Insecurity: No Food Insecurity (04/02/2024)   Epic    Worried About Programme Researcher, Broadcasting/film/video in the Last Year: Never true    Ran Out of Food in the Last Year: Never true  Transportation Needs: No Transportation Needs (04/02/2024)   Epic    Lack of Transportation (Medical): No    Lack of Transportation (Non-Medical): No  Physical Activity: Insufficiently Active (04/02/2024)   Exercise Vital Sign    Days of Exercise per Week: 3 days    Minutes of Exercise per Session: 40 min  Stress: No Stress Concern Present (04/02/2024)   [employer] of Occupational Health - Occupational Stress Questionnaire    Feeling of  Stress: Not at all  Social Connections: Socially Integrated (04/02/2024)   Social Connection and Isolation Panel    Frequency of Communication with Friends and Family: More than three times a week    Frequency of Social Gatherings with Friends and Family: Once a week    Attends Religious Services: More than 4 times per year    Active Member of Clubs or Organizations: Yes    Attends Banker Meetings: More than 4 times per year    Marital Status: Married  Depression (PHQ2-9): Low Risk (05/05/2024)   Depression (PHQ2-9)    PHQ-2 Score: 0  Alcohol Screen: Low Risk (04/02/2024)   Alcohol Screen    Last Alcohol Screening Score (AUDIT): 1  Housing: Low Risk (04/02/2024)   Epic    Unable to Pay for Housing in the Last Year: No    Number of Times Moved in the Last Year: 0    Homeless in the Last Year: No  Utilities: Not At Risk (03/31/2024)   Epic    Threatened with loss of utilities: No  Health Literacy: Adequate Health Literacy (03/31/2024)   B1300 Health Literacy    Frequency of need for help with medical instructions: Never     Family History:  The patient's family history includes ALS in her mother; Breast cancer in her paternal grandmother; Breast cancer (age of onset: 52) in her sister; Colon cancer in her maternal grandfather; Colon cancer (age of onset: 18) in her son; Coronary artery disease in her mother; Diabetes in her mother; Down syndrome in her son; Heart disease in her mother; Hyperlipidemia in her father; Intellectual disability in her son; Lung cancer in her paternal grandfather; Other in her daughter; Stomach cancer in her maternal grandfather. There is no history of Esophageal cancer, Liver cancer, Pancreatic cancer, or Rectal cancer.  ROS:   12-point review of systems is negative unless otherwise noted in the HPI.   EKGs/Labs/Other Studies Reviewed:    Studies reviewed were summarized above. The additional studies were reviewed today:  2D echo  10/23/2023: 1. Left ventricular ejection fraction, by estimation, is 55 to 60%. Left  ventricular ejection fraction by 3D volume is 63 %. The left ventricle has  normal function. The left ventricle has no regional wall motion  abnormalities. Left ventricular  diastolic   parameters are consistent with Grade I diastolic dysfunction (impaired  relaxation).   2. Right ventricular systolic function is normal. The right ventricular  size is normal. There is normal pulmonary artery systolic pressure. The  estimated right ventricular systolic pressure is 33.0 mmHg.   3. Left atrial size was mild to moderately dilated.   4. The mitral valve is normal in structure. Moderate mitral valve  regurgitation. No evidence of mitral stenosis.   5. The aortic valve has an indeterminant number of cusps. Aortic valve  regurgitation is not visualized. No aortic stenosis is present.   6. The inferior vena cava is dilated in size with >50% respiratory  variability, suggesting right atrial pressure of 8 mmHg.  __________   LHC 09/25/2023: Diagnostic Dominance: Right Left Main  Vessel was injected. Vessel is normal in caliber. Vessel is angiographically normal.    Left Anterior Descending  Prox LAD lesion is 85% stenosed. The lesion is focal.    Left Circumflex  Vessel was injected. Vessel is normal in caliber. The vessel exhibits minimal luminal irregularities.    Right Coronary Artery  Vessel was injected. Vessel is large. Vessel is angiographically normal.          Single vessel obstructive CAD involving the proximal LAD Normal LV function Normal LVEDP Successful PCI of the proximal LAD with DES x 1   Plan: DAPT for one year. Anticipate same day DC __________   Lexiscan  MPI 11/08/2018: Pharmacological myocardial perfusion imaging study with no significant  Ischemia Breast attenuation artifact noted Normal wall motion, EF could not be estimated secondary to technical issues No EKG changes  concerning for ischemia at peak stress or in recovery. Low risk scan __________   2D echo 10/05/2017: - Left ventricle: The cavity size was normal. There was mild    concentric hypertrophy. Systolic function was vigorous. The    estimated ejection fraction was in the range of 65% to 70%. Wall    motion was normal; there were no regional wall motion    abnormalities. Doppler parameters are consistent with abnormal    left ventricular relaxation (grade 1 diastolic dysfunction).  __________   See CV Studies in Epic for more remote cardiac imaging   EKG:  EKG is ordered today.  The EKG ordered today demonstrates ***  Recent Labs: 03/31/2024: ALT 18; TSH 4.09 05/05/2024: BUN 13; Creat 0.88; Hemoglobin 12.6; Platelets 259; Potassium 4.5; Sodium 138  Recent Lipid Panel    Component Value Date/Time   CHOL 94 03/31/2024 1117   CHOL 80 (L) 11/30/2023 0903   CHOL 151 10/17/2011 0826   TRIG 152.0 (H) 03/31/2024 1117   TRIG 136 10/17/2011 0826   HDL 43.30 03/31/2024 1117   HDL 36 (L) 11/30/2023 0903   HDL 44 10/17/2011 0826   CHOLHDL 2 03/31/2024 1117   VLDL 30.4 03/31/2024 1117   VLDL 27 10/17/2011 0826   LDLCALC 20 03/31/2024 1117   LDLCALC 21 11/30/2023 0903   LDLCALC 80 10/17/2011 0826   LDLDIRECT 19 11/30/2023 0903   LDLDIRECT 62.0 11/22/2022 1558    PHYSICAL EXAM:    VS:  There were no vitals taken for this visit.  BMI: There is no height or weight on file to calculate BMI.  Physical Exam  Wt Readings from Last 3 Encounters:  05/05/24 152 lb 4.8 oz (69.1 kg)  04/29/24 155 lb 1.6 oz (70.4 kg)  04/02/24 154 lb 2 oz (69.9 kg)     ASSESSMENT & PLAN:  CAD involving the native coronary arteries with ***:  HLD/hypertriglyceridemia: LDL 20 with triglyceride 152 in 03/2024.  Radial artery pseudoaneurysm:   {Are you ordering a CV Procedure (e.g. stress test, cath, DCCV, TEE, etc)?   Press F2        :[id number]}     Disposition: F/u with Dr. Gollan or an APP in  ***.   Medication Adjustments/Labs and Tests Ordered: Current medicines are reviewed at length with the patient today.  Concerns regarding medicines are outlined above. Medication changes, Labs and Tests ordered today are summarized above and listed in the Patient Instructions accessible in Encounters.   Signed, Bernardino Bring, PA-C 05/30/2024 12:53 PM     [facility] [place] [address] [place], [place] [address] [phone]

## 2024-06-01 ENCOUNTER — Encounter: Payer: Self-pay | Admitting: Family Medicine

## 2024-06-01 NOTE — Progress Notes (Unsigned)
 Brandi Mahoney T. Rajean Desantiago, MD, CAQ Sports Medicine [facility] [address] [place], [address]  Phone: [phone]  FAX: [phone]  Brandi Mahoney - 68 y.o. female  MRN [id number]  Date of Birth: June 13, 1956  Date: 06/02/2024  PCP: Watt Mirza, MD  Referral: Watt Mirza, MD  No chief complaint on file.  Subjective:   Brandi Mahoney is a 68 y.o. very pleasant female patient with There is no height or weight on file to calculate BMI. who presents with the following:  Discussed the use of AI scribe software for clinical note transcription with the patient, who gave verbal consent to proceed.  Brandi Mahoney presents with ongoing back pain.  This is this has been an issue for her on and off going back for multiple years. History of Present Illness     Review of Systems is noted in the HPI, as appropriate  Objective:   There were no vitals taken for this visit.  GEN: No acute distress; alert,appropriate. PULM: Breathing comfortably in no respiratory distress PSYCH: Normally interactive.   Laboratory and Imaging Data:  Assessment and Plan:   No diagnosis found. Assessment & Plan   Medication Management during today's office visit: No orders of the defined types were placed in this encounter.  There are no discontinued medications.  Orders placed today for conditions managed today: No orders of the defined types were placed in this encounter.   Disposition: No follow-ups on file.  Dragon [employer] One speech-to-text software was used for transcription in this dictation.  Possible transcriptional errors can occur using Animal nutritionist.   Signed,  Brandi Mahoney. Brandi Oshea, MD   Outpatient Encounter Medications as of 06/02/2024  Medication Sig   albuterol  (VENTOLIN  HFA) 108 (90 Base) MCG/ACT inhaler Inhale 2 puffs into the lungs every 6 (six) hours as needed for wheezing or shortness of breath.   amLODipine  (NORVASC ) 2.5 MG  tablet TAKE 1 TABLET BY MOUTH DAILY   aspirin  EC 81 MG tablet Take 81 mg by mouth daily.   benzonatate  (TESSALON ) 100 MG capsule Take 1 capsule (100 mg total) by mouth 3 (three) times daily as needed for cough.   clobetasol  ointment (TEMOVATE ) 0.05 % Apply 1 Application topically daily as needed (skin conditions).   Estradiol  10 MCG TABS vaginal tablet Place 1 tablet per vagina at night 2 nights per week.   fenofibrate  160 MG tablet TAKE 1 TABLET BY MOUTH DAILY   fluticasone (FLONASE) 50 MCG/ACT nasal spray Place 2 sprays into both nostrils in the morning and at bedtime.   Fluticasone-Umeclidin-Vilant (TRELEGY ELLIPTA ) 200-62.5-25 MCG/ACT AEPB Inhale 1 puff into the lungs daily.   glucose blood (ACCU-CHEK GUIDE) test strip Use to check blood sugar daily   HYDROcodone -acetaminophen  (NORCO/VICODIN) 5-325 MG tablet Take by mouth.   Lactobacillus-Inulin (CULTURELLE ADULT ULT BALANCE PO) Take 1 tablet by mouth daily.   levothyroxine  (SYNTHROID ) 75 MCG tablet TAKE ONE TABLET BY MOUTH EVERY MORNING BEFORE BREAKFAST   Magnesium  250 MG TABS Take 250 mg by mouth daily.   metFORMIN  (GLUCOPHAGE -XR) 500 MG 24 hr tablet Take 2 tablets (1,000 mg total) by mouth daily with breakfast.   Multiple Vitamins-Minerals (ONE-A-DAY WOMENS PETITES PO) Take 1 tablet by mouth daily.   nitroGLYCERIN  (NITROSTAT ) 0.4 MG SL tablet Place 1 tablet under tongue at onset of chest pain; you may repeat every 5 minutes for up to 3 doses.   ondansetron  (ZOFRAN ) 4 MG tablet Take 1 tablet (4 mg total)  by mouth every 8 (eight) hours as needed for nausea or vomiting.   pantoprazole  (PROTONIX ) 40 MG tablet Take 1 tablet (40 mg total) by mouth daily.   prasugrel  (EFFIENT ) 10 MG TABS tablet Take 1 tablet (10 mg total) by mouth daily.   rosuvastatin  (CRESTOR ) 40 MG tablet Take 1 tablet (40 mg total) by mouth daily.   sertraline  (ZOLOFT ) 50 MG tablet TAKE 1 TABLET BY MOUTH DAILY   traMADol  (ULTRAM ) 50 MG tablet Take 50 mg by mouth 2 (two)  times daily as needed.   traZODone  (DESYREL ) 100 MG tablet TAKE 1 TABLET BY MOUTH AT BEDTIME   No facility-administered encounter medications on file as of 06/02/2024.

## 2024-06-02 ENCOUNTER — Ambulatory Visit: Admitting: Family Medicine

## 2024-06-02 ENCOUNTER — Encounter: Payer: Self-pay | Admitting: Family Medicine

## 2024-06-02 VITALS — BP 132/68 | HR 87 | Temp 98.1°F | Ht 63.5 in | Wt 154.3 lb

## 2024-06-02 DIAGNOSIS — G8929 Other chronic pain: Secondary | ICD-10-CM

## 2024-06-02 DIAGNOSIS — M549 Dorsalgia, unspecified: Secondary | ICD-10-CM | POA: Diagnosis not present

## 2024-06-02 DIAGNOSIS — M5137 Other intervertebral disc degeneration, lumbosacral region with discogenic back pain only: Secondary | ICD-10-CM | POA: Diagnosis not present

## 2024-06-02 MED ORDER — PREDNISONE 20 MG PO TABS: ORAL_TABLET | ORAL | 0 refills | Status: AC

## 2024-06-02 MED ORDER — TRAMADOL HCL 50 MG PO TABS: 50.0000 mg | ORAL_TABLET | Freq: Two times a day (BID) | ORAL | 0 refills | Status: AC | PRN

## 2024-06-02 MED ORDER — CYCLOBENZAPRINE HCL 10 MG PO TABS: 10.0000 mg | ORAL_TABLET | Freq: Every evening | ORAL | 1 refills | Status: AC | PRN

## 2024-06-06 ENCOUNTER — Ambulatory Visit: Attending: Physician Assistant | Admitting: Physician Assistant

## 2024-06-06 ENCOUNTER — Encounter: Payer: Self-pay | Admitting: Physician Assistant

## 2024-06-06 VITALS — BP 120/69 | HR 71 | Ht 64.0 in | Wt 158.4 lb

## 2024-06-06 DIAGNOSIS — I340 Nonrheumatic mitral (valve) insufficiency: Secondary | ICD-10-CM | POA: Diagnosis not present

## 2024-06-06 DIAGNOSIS — I729 Aneurysm of unspecified site: Secondary | ICD-10-CM

## 2024-06-06 DIAGNOSIS — I25118 Atherosclerotic heart disease of native coronary artery with other forms of angina pectoris: Secondary | ICD-10-CM

## 2024-06-06 DIAGNOSIS — I447 Left bundle-branch block, unspecified: Secondary | ICD-10-CM

## 2024-06-06 DIAGNOSIS — E781 Pure hyperglyceridemia: Secondary | ICD-10-CM

## 2024-06-06 DIAGNOSIS — E785 Hyperlipidemia, unspecified: Secondary | ICD-10-CM | POA: Diagnosis not present

## 2024-06-06 MED ORDER — FENOFIBRATE 160 MG PO TABS: 160.0000 mg | ORAL_TABLET | Freq: Every day | ORAL | 3 refills | Status: AC

## 2024-06-06 NOTE — Patient Instructions (Signed)
 Medication Instructions:  Your physician recommends that you continue on your current medications as directed. Please refer to the Current Medication list given to you today.   *If you need a refill on your cardiac medications before your next appointment, please call your pharmacy*  Lab Work: None ordered at this time  If you have labs (blood work) drawn today and your tests are completely normal, you will receive your results only by: MyChart Message (if you have MyChart) OR A paper copy in the mail If you have any lab test that is abnormal or we need to change your treatment, we will call you to review the results.  Testing/Procedures: Your physician has requested that you have an echocardiogram IN APRIL 2026. Echocardiography is a painless test that uses sound waves to create images of your heart. It provides your doctor with information about the size and shape of your heart and how well your hearts chambers and valves are working.   You may receive an ultrasound enhancing agent through an IV if needed to better visualize your heart during the echo. This procedure takes approximately one hour.  There are no restrictions for this procedure.  This will take place at [address] [facility] [address] [facility]) [address], [place] [address]  Please note: We ask at that you not bring children with you during ultrasound (echo/ vascular) testing. Due to room size and safety concerns, children are not allowed in the ultrasound rooms during exams. Our front office staff cannot provide observation of children in our lobby area while testing is being conducted. An adult accompanying a patient to their appointment will only be allowed in the ultrasound room at the discretion of the ultrasound technician under special circumstances. We apologize for any inconvenience.   Follow-Up: At [facility], you and your health needs are our priority.  As part of our continuing mission to provide you with  exceptional heart care, our providers are all part of one team.  This team includes your primary Cardiologist (physician) and Advanced Practice Providers or APPs (Physician Assistants and Nurse Practitioners) who all work together to provide you with the care you need, when you need it.  Your next appointment:   In mid-April after echo  Provider:   You may see Timothy Gollan, MD or Bernardino Bring, PA-C

## 2024-06-12 ENCOUNTER — Other Ambulatory Visit: Payer: Self-pay | Admitting: Family Medicine

## 2024-06-28 ENCOUNTER — Other Ambulatory Visit: Payer: Self-pay | Admitting: Family Medicine

## 2024-07-03 ENCOUNTER — Other Ambulatory Visit: Payer: Self-pay | Admitting: Family Medicine

## 2024-07-04 NOTE — Telephone Encounter (Signed)
 Please advise this was d/c from hospital should patient be taking?

## 2024-07-25 ENCOUNTER — Telehealth: Payer: Self-pay

## 2024-07-25 DIAGNOSIS — L900 Lichen sclerosus et atrophicus: Secondary | ICD-10-CM

## 2024-07-25 NOTE — Telephone Encounter (Signed)
 Patient called requesting a referral to [facility] dermatology and cancer center for the Lichen Sclerosis. I placed the referral already.

## 2024-08-11 ENCOUNTER — Ambulatory Visit

## 2024-09-23 ENCOUNTER — Ambulatory Visit

## 2024-10-08 ENCOUNTER — Ambulatory Visit: Admitting: Physician Assistant

## 2025-01-01 ENCOUNTER — Other Ambulatory Visit
